# Patient Record
Sex: Male | Born: 1970
Health system: Southern US, Community
[De-identification: ages and names within clinical notes are randomized; demographics above are authoritative.]

## PROBLEM LIST (undated history)

## (undated) DIAGNOSIS — I824Y9 Acute embolism and thrombosis of unspecified deep veins of unspecified proximal lower extremity: Secondary | ICD-10-CM

## (undated) DIAGNOSIS — E1122 Type 2 diabetes mellitus with diabetic chronic kidney disease: Secondary | ICD-10-CM

## (undated) DIAGNOSIS — E8809 Other disorders of plasma-protein metabolism, not elsewhere classified: Secondary | ICD-10-CM

## (undated) DIAGNOSIS — M726 Necrotizing fasciitis: Secondary | ICD-10-CM

## (undated) DIAGNOSIS — Z7901 Long term (current) use of anticoagulants: Secondary | ICD-10-CM

## (undated) DIAGNOSIS — E119 Type 2 diabetes mellitus without complications: Secondary | ICD-10-CM

## (undated) DIAGNOSIS — D62 Acute posthemorrhagic anemia: Secondary | ICD-10-CM

## (undated) DIAGNOSIS — Z8719 Personal history of other diseases of the digestive system: Secondary | ICD-10-CM

## (undated) DIAGNOSIS — N179 Acute kidney failure, unspecified: Secondary | ICD-10-CM

## (undated) DIAGNOSIS — Z794 Long term (current) use of insulin: Secondary | ICD-10-CM

## (undated) DIAGNOSIS — I10 Essential (primary) hypertension: Secondary | ICD-10-CM

## (undated) DIAGNOSIS — L03115 Cellulitis of right lower limb: Secondary | ICD-10-CM

## (undated) DIAGNOSIS — E11622 Type 2 diabetes mellitus with other skin ulcer: Secondary | ICD-10-CM

## (undated) DIAGNOSIS — N182 Chronic kidney disease, stage 2 (mild): Secondary | ICD-10-CM

## (undated) DIAGNOSIS — I82401 Acute embolism and thrombosis of unspecified deep veins of right lower extremity: Secondary | ICD-10-CM

## (undated) DIAGNOSIS — M109 Gout, unspecified: Secondary | ICD-10-CM

## (undated) DIAGNOSIS — A4151 Sepsis due to Escherichia coli [E. coli]: Secondary | ICD-10-CM

## (undated) DIAGNOSIS — L97209 Non-pressure chronic ulcer of unspecified calf with unspecified severity: Secondary | ICD-10-CM

## (undated) DIAGNOSIS — A419 Sepsis, unspecified organism: Secondary | ICD-10-CM

## (undated) DIAGNOSIS — R000 Tachycardia, unspecified: Secondary | ICD-10-CM

## (undated) HISTORY — DX: Acute kidney failure, unspecified: N17.9

## (undated) HISTORY — DX: Necrotizing fasciitis: M72.6

## (undated) HISTORY — DX: Non-pressure chronic ulcer of unspecified calf with unspecified severity: L97.209

## (undated) HISTORY — DX: Long term (current) use of insulin: Z79.4

## (undated) HISTORY — DX: Chronic kidney disease, stage 2 (mild): N18.2

## (undated) HISTORY — DX: Acute posthemorrhagic anemia: D62

## (undated) HISTORY — DX: Acute embolism and thrombosis of unspecified deep veins of unspecified proximal lower extremity: I82.4Y9

## (undated) HISTORY — DX: Other disorders of plasma-protein metabolism, not elsewhere classified: E88.09

## (undated) HISTORY — DX: Sepsis due to Escherichia coli (e. coli): A41.51

## (undated) HISTORY — DX: Acute embolism and thrombosis of unspecified deep veins of right lower extremity: I82.401

## (undated) HISTORY — DX: Cellulitis of right lower limb: L03.115

## (undated) HISTORY — DX: Tachycardia, unspecified: R00.0

## (undated) HISTORY — DX: Type 2 diabetes mellitus with other skin ulcer: E11.622

## (undated) HISTORY — DX: Sepsis, unspecified organism: A41.9

## (undated) HISTORY — DX: Long term (current) use of anticoagulants: Z79.01

## (undated) HISTORY — DX: Type 2 diabetes mellitus with diabetic chronic kidney disease: E11.22

## (undated) HISTORY — PX: ELBOW SURGERY: SHX618

---

## 2002-12-10 ENCOUNTER — Encounter: Payer: Self-pay | Admitting: Emergency Medicine

## 2002-12-10 ENCOUNTER — Emergency Department (HOSPITAL_COMMUNITY): Admission: EM | Admit: 2002-12-10 | Discharge: 2002-12-10 | Payer: Self-pay | Admitting: Emergency Medicine

## 2004-07-16 HISTORY — PX: SCROTAL SURGERY: SHX2387

## 2007-07-19 ENCOUNTER — Emergency Department (HOSPITAL_COMMUNITY): Admission: EM | Admit: 2007-07-19 | Discharge: 2007-07-19 | Payer: Self-pay | Admitting: Emergency Medicine

## 2008-01-28 DIAGNOSIS — I10 Essential (primary) hypertension: Secondary | ICD-10-CM | POA: Insufficient documentation

## 2011-04-05 LAB — POCT I-STAT CREATININE
Creatinine, Ser: 1.3
Operator id: 285491

## 2011-04-05 LAB — I-STAT 8, (EC8 V) (CONVERTED LAB)
Acid-Base Excess: 2
BUN: 12
Bicarbonate: 28.2 — ABNORMAL HIGH
Chloride: 104
Glucose, Bld: 116 — ABNORMAL HIGH
HCT: 52
Hemoglobin: 17.7 — ABNORMAL HIGH
Operator id: 285491
Potassium: 3.5
Sodium: 139
TCO2: 30
pCO2, Ven: 46.1
pH, Ven: 7.395 — ABNORMAL HIGH

## 2016-02-29 ENCOUNTER — Ambulatory Visit (INDEPENDENT_AMBULATORY_CARE_PROVIDER_SITE_OTHER): Payer: PRIVATE HEALTH INSURANCE | Admitting: Physician Assistant

## 2016-02-29 ENCOUNTER — Ambulatory Visit (INDEPENDENT_AMBULATORY_CARE_PROVIDER_SITE_OTHER): Payer: PRIVATE HEALTH INSURANCE

## 2016-02-29 VITALS — BP 182/110 | HR 99 | Temp 98.9°F | Resp 17 | Ht 70.0 in | Wt 252.0 lb

## 2016-02-29 DIAGNOSIS — M25562 Pain in left knee: Secondary | ICD-10-CM

## 2016-02-29 DIAGNOSIS — M25462 Effusion, left knee: Secondary | ICD-10-CM | POA: Diagnosis not present

## 2016-02-29 LAB — POCT CBC
Granulocyte percent: 58 %G (ref 37–80)
HCT, POC: 43.2 % — AB (ref 43.5–53.7)
Hemoglobin: 15.1 g/dL (ref 14.1–18.1)
Lymph, poc: 2.2 (ref 0.6–3.4)
MCH, POC: 29.7 pg (ref 27–31.2)
MCHC: 34.9 g/dL (ref 31.8–35.4)
MCV: 85 fL (ref 80–97)
MID (cbc): 0.3 (ref 0–0.9)
MPV: 8.2 fL (ref 0–99.8)
POC Granulocyte: 3.5 (ref 2–6.9)
POC LYMPH PERCENT: 36.3 %L (ref 10–50)
POC MID %: 5.7 %M (ref 0–12)
Platelet Count, POC: 252 10*3/uL (ref 142–424)
RBC: 5.08 M/uL (ref 4.69–6.13)
RDW, POC: 12.6 %
WBC: 6.1 10*3/uL (ref 4.6–10.2)

## 2016-02-29 LAB — D-DIMER, QUANTITATIVE (NOT AT ARMC): D-Dimer, Quant: 0.35 mcg/mL FEU (ref <0.50)

## 2016-02-29 MED ORDER — INDOMETHACIN 25 MG PO CAPS: 25.0000 mg | ORAL_CAPSULE | Freq: Two times a day (BID) | ORAL | 0 refills | Status: DC

## 2016-02-29 NOTE — Progress Notes (Signed)
By signing my name below, I, Mesha Guinyard, attest that this documentation has been prepared under the direction and in the presence of Ivar Drape, MD.  Electronically Signed: Verlee Monte, Medical Scribe. 02/29/16. 10:52 AM.  Subjective:    Patient ID: [name], male    DOB: 08-28-1970, 45 y.o.   MRN: [id number]  HPI Chief Complaint  Patient presents with   Knee Pain    and swelling x3days. Left knee. Pt states swelling "started in his foot"    HPI Comments: Benjerman K Steighner is a 45 y.o. male who presents to the [facility] complaining of knee pain onset 2 days ago. Pt mentions pain started at his foot 5 days ago, and the pain radiated to his knee. Pt reports limping, having trouble bending when he gets in and out of his car. Pt walks a lot every night for is job. Pt mentions he usually has gout in his feet. Pt has been eats a sausage patty every morning for breakfast and mentions he's been eating more red meats. Pt took a 1 hour drive to [place], [place] 4 days ago. Pt hasn't taken his bp medication this morning. Pt denies his knee giving out. Pt denies PMHx of blood clots, or malignancies.   There are no active problems to display for this patient.  No past medical history on file. No past surgical history on file. Not on File Prior to Admission medications   Medication Sig Start Date End Date Taking? Authorizing Provider  amLODipine (NORVASC) 2.5 MG tablet Take 2.5 mg by mouth daily.   Yes Historical Provider, MD  insulin aspart protamine- aspart (NOVOLOG MIX 70/30) (70-30) 100 UNIT/ML injection Inject into the skin.   Yes Historical Provider, MD  lisinopril-hydrochlorothiazide (PRINZIDE,ZESTORETIC) 10-12.5 MG tablet Take 1 tablet by mouth daily.   Yes Historical Provider, MD   Social History   Social History   Marital status: Single    Spouse name: N/A   Number of children: N/A   Years of education: N/A   Occupational History    Not on file.   Social History Main Topics   Smoking status: Never Smoker   Smokeless tobacco: Not on file   Alcohol use Not on file   Drug use: Unknown   Sexual activity: Not on file   Other Topics Concern   Not on file   Social History Narrative   No narrative on file   Depression screen [facility] 2/9 02/29/2016  Decreased Interest 0  Down, Depressed, Hopeless 0  PHQ - 2 Score 0   Review of Systems  Constitutional: Negative for fever.  Respiratory: Negative for shortness of breath.   Cardiovascular: Negative for chest pain.  Musculoskeletal: Positive for arthralgias and joint swelling.  Neurological: Negative for dizziness.  ROS reviewed. Otherwise unremarkable, unless listed above.  Objective:  Physical Exam  Constitutional: He appears well-developed and well-nourished. No distress.  HENT:  Head: Normocephalic and atraumatic.  Eyes: Conjunctivae are normal.  Neck: Neck supple.  Cardiovascular: Normal rate.   Pulmonary/Chest: Effort normal.  Musculoskeletal:  Left knee diffuse swelling and warmth in the area. Swelling extends above the knee Tenderness along medial and lateral joint line  No laxity to the knee Posterior tenderness to the knee Negative anterior drawer test  Neurological: He is alert.  Skin: Skin is warm and dry.  Psychiatric: He has a normal mood and affect. His behavior is normal.  Nursing note and vitals reviewed.  BP (!) 170/130 (  BP Location: Right Arm, Patient Position: Sitting, Cuff Size: Normal)    Pulse 99    Temp 98.9 F (37.2 C)    Resp 17    Ht 5\' 10"  (1.778 m)    Wt 252 lb (114.3 kg)    SpO2 98%    BMI 36.16 kg/m   Dg Knee Complete 4 Views Left  Result Date: 02/29/2016 CLINICAL DATA:  Left knee swelling. No known injury. Initial evaluation EXAM: LEFT KNEE - COMPLETE 4+ VIEW COMPARISON:  No recent prior . FINDINGS: No acute bony or joint abnormality identified. No evidence fracture or dislocation. Mild medial compartment degenerative  change. Knee joint effusion cannot be excluded. IMPRESSION: 1. No acute bony abnormality. Mild medial compartment degenerative change. 2. Knee joint effusion cannot be excluded. Electronically Signed   By: Marcello Moores  Register   On: 02/29/2016 11:11   Results for orders placed or performed in visit on 02/29/16  POCT CBC  Result Value Ref Range   WBC 6.1 4.6 - 10.2 K/uL   Lymph, poc 2.2 0.6 - 3.4   POC LYMPH PERCENT 36.3 10 - 50 %L   MID (cbc) 0.3 0 - 0.9   POC MID % 5.7 0 - 12 %M   POC Granulocyte 3.5 2 - 6.9   Granulocyte percent 58.0 37 - 80 %G   RBC 5.08 4.69 - 6.13 M/uL   Hemoglobin 15.1 14.1 - 18.1 g/dL   HCT, POC 43.2 (A) 43.5 - 53.7 %   MCV 85.0 80 - 97 fL   MCH, POC 29.7 27 - 31.2 pg   MCHC 34.9 31.8 - 35.4 g/dL   RDW, POC 12.6 %   Platelet Count, POC 252 142 - 424 K/uL   MPV 8.2 0 - 99.8 fL   Assessment & Plan:  45 year old male is here today for knee pain.  --treating as possible gout, possible inflammatory condition.  Will take d dimer.  Advised to return within 10 days if symptoms do not improve. We are also drawing a d-dimer.  He has voiced understanding that he will have the  Left knee pain - Plan: DG Knee Complete 4 Views Left, POCT CBC, indomethacin (INDOCIN) 25 MG capsule, D-dimer, quantitative (not at [facility]), CANCELED: D-dimer, quantitative (not at [facility])  Knee swelling, left - Plan: DG Knee Complete 4 Views Left, POCT CBC, indomethacin (INDOCIN) 25 MG capsule, D-dimer, quantitative (not at [facility]), CANCELED: D-dimer, quantitative (not at [facility])  Ivar Drape, PA-C [facility] 8/25/20177:35 AM   I personally performed the services described in this documentation, which was scribed in my presence. The recorded information has been reviewed and is accurate.

## 2016-02-29 NOTE — Patient Instructions (Addendum)
Please rest and ice the knee.  Please await a phone call today.  If we contact you with a positive d dimer, I need you to go to the emergency department immediately.  Gout Gout is when your joints become red, sore, and swell (inflamed). This is caused by the buildup of uric acid crystals in the joints. Uric acid is a chemical that is normally in the blood. If the level of uric acid gets too high in the blood, these crystals form in your joints and tissues. Over time, these crystals can form into masses near the joints and tissues. These masses can destroy bone and cause the bone to look misshapen (deformed). HOME CARE   Do not take aspirin for pain.  Only take medicine as told by your doctor.  Rest the joint as much as you can. When in bed, keep sheets and blankets off painful areas.  Keep the sore joints raised (elevated).  Put warm or cold packs on painful joints. Use of warm or cold packs depends on which works best for you.  Use crutches if the painful joint is in your leg.  Drink enough fluids to keep your pee (urine) clear or pale yellow. Limit alcohol, sugary drinks, and drinks with fructose in them.  Follow your diet instructions. Pay careful attention to how much protein you eat. Include fruits, vegetables, whole grains, and fat-free or low-fat milk products in your daily diet. Talk to your doctor or dietitian about the use of coffee, vitamin C, and cherries. These may help lower uric acid levels.  Keep a healthy body weight. GET HELP RIGHT AWAY IF:   You have watery poop (diarrhea), throw up (vomit), or have any side effects from medicines.  You do not feel better in 24 hours, or you are getting worse.  Your joint becomes suddenly more tender, and you have chills or a fever. MAKE SURE YOU:   Understand these instructions.  Will watch your condition.  Will get help right away if you are not doing well or get worse.   This information is not intended to replace advice  given to you by your health care provider. Make sure you discuss any questions you have with your health care provider.   Document Released: 04/10/2008 Document Revised: 07/23/2014 Document Reviewed: 02/13/2012 Elsevier Interactive Patient Education 2016 [employer].    IF you received an x-ray today, you will receive an invoice from [facility]. Please contact [facility] at [phone] with questions or concerns regarding your invoice.   IF you received labwork today, you will receive an invoice from [employer]. Please contact [facility] at [phone] with questions or concerns regarding your invoice.   Our billing staff will not be able to assist you with questions regarding bills from these companies.  You will be contacted with the lab results as soon as they are available. The fastest way to get your results is to activate your My Chart account. Instructions are located on the last page of this paperwork. If you have not heard from [place] regarding the results in 2 weeks, please contact this office.

## 2017-03-20 ENCOUNTER — Emergency Department
Admission: EM | Admit: 2017-03-20 | Discharge: 2017-03-20 | Disposition: A | Discharge status: Home / Self Care | Payer: BLUE CROSS/BLUE SHIELD | Attending: Emergency Medicine | Admitting: Emergency Medicine

## 2017-03-20 ENCOUNTER — Encounter: Payer: Self-pay | Admitting: Emergency Medicine

## 2017-03-20 ENCOUNTER — Emergency Department: Payer: BLUE CROSS/BLUE SHIELD

## 2017-03-20 DIAGNOSIS — I82431 Acute embolism and thrombosis of right popliteal vein: Secondary | ICD-10-CM | POA: Diagnosis not present

## 2017-03-20 DIAGNOSIS — E119 Type 2 diabetes mellitus without complications: Secondary | ICD-10-CM | POA: Insufficient documentation

## 2017-03-20 DIAGNOSIS — I824Y9 Acute embolism and thrombosis of unspecified deep veins of unspecified proximal lower extremity: Secondary | ICD-10-CM

## 2017-03-20 DIAGNOSIS — I10 Essential (primary) hypertension: Secondary | ICD-10-CM | POA: Diagnosis not present

## 2017-03-20 DIAGNOSIS — Z79899 Other long term (current) drug therapy: Secondary | ICD-10-CM | POA: Diagnosis not present

## 2017-03-20 DIAGNOSIS — M79661 Pain in right lower leg: Secondary | ICD-10-CM | POA: Diagnosis present

## 2017-03-20 DIAGNOSIS — Z794 Long term (current) use of insulin: Secondary | ICD-10-CM | POA: Insufficient documentation

## 2017-03-20 HISTORY — DX: Gout, unspecified: M10.9

## 2017-03-20 HISTORY — DX: Type 2 diabetes mellitus without complications: E11.9

## 2017-03-20 HISTORY — DX: Essential (primary) hypertension: I10

## 2017-03-20 HISTORY — DX: Acute embolism and thrombosis of unspecified deep veins of unspecified proximal lower extremity: I82.4Y9

## 2017-03-20 MED ORDER — RIVAROXABAN (XARELTO) VTE STARTER PACK (15 & 20 MG): ORAL_TABLET | ORAL | 0 refills | Status: DC

## 2017-03-20 MED ORDER — RIVAROXABAN 15 MG PO TABS
15.0000 mg | ORAL_TABLET | Freq: Once | ORAL | Status: AC
Start: 2017-03-20 — End: 2017-03-20
  Administered 2017-03-20: 15 mg via ORAL
  Filled 2017-03-20: qty 1

## 2017-03-20 MED ORDER — RIVAROXABAN 20 MG PO TABS: 20.0000 mg | ORAL_TABLET | Freq: Once | ORAL | Status: DC

## 2017-03-20 NOTE — ED Triage Notes (Signed)
Patient to ER for c/o right leg pain and swelling. States swelling started slowly a couple days ago, rested it and then went back to work. States today leg is much more swollen than it was. Denies any known injury. Pain is more severe to back of lower calf per patient. Patient able to ambulate to stat desk/triage, but visibly in pain.

## 2017-03-20 NOTE — ED Notes (Signed)
Questioned patient about history of hypertension and diabetes.  Patient reports moved here from [place] June 2016 and reports had insulin and hypertension medication but has since run out and does not have a primary care physician in the area.  ED provider made aware.

## 2017-03-20 NOTE — ED Notes (Signed)
Patient reports having pain and swelling to right calf off and on since Sunday, with pain becoming worse on Tuesday.  Patient reports car trip to [place] on Friday evening and returning on Monday morning.  Patient reports that he took frequent breaks driving to and from.  Patient with good pedal pulses bilaterally, denies shortness of breath or chest pain.

## 2017-03-20 NOTE — ED Provider Notes (Signed)
[facility] Emergency Department Provider Note   ____________________________________________   First MD Initiated Contact with Patient 03/20/17 [phone]     (approximate)  I have reviewed the triage vital signs and the nursing notes.   HISTORY  Chief Complaint Leg Pain    HPI [name] is a 46 y.o. male Who comes into the hospital today with right leg swelling. He's had these symptoms off and on he reports since Sunday. It got really bad tonight while he was at work. He had pain from his knee down to his leg. The patient wanted to get it looked at. He has not been taking anything for pain. This all started on Sunday afternoon. The patient rates his pain a 5 out of 10 in intensity. He reports that if he sits still the swelling seems to go down but if he walks on it swells more. The patient recently traveled to [place]. He left on Saturday and returned on Monday. The patient states that he did get out of the car and ambulate frequently. He is here today for evaluation.The patient has no chest pain and shortness of breath   Past Medical History:  Diagnosis Date  . Diabetes mellitus without complication ([place])   . Gout   . Hypertension     There are no active problems to display for this patient.   Past Surgical History:  Procedure Laterality Date  . SCROTAL SURGERY  2006    Prior to Admission medications   Medication Sig Start Date End Date Taking? Authorizing Provider  amLODipine (NORVASC) 2.5 MG tablet Take 2.5 mg by mouth daily.    [provider]  indomethacin (INDOCIN) 25 MG capsule Take 1 capsule (25 mg total) by mouth 2 (two) times daily with a meal. 02/29/16   English, Colletta Maryland D, PA  insulin aspart protamine- aspart (NOVOLOG MIX 70/30) (70-30) 100 UNIT/ML injection Inject into the skin.    [provider]  lisinopril-hydrochlorothiazide (PRINZIDE,ZESTORETIC) 10-12.5 MG tablet Take 1 tablet by mouth daily.    [provider]  Rivaroxaban 15 & 20 MG TBPK Take as directed on package: Start with one 15mg  tablet by mouth twice a day with food. On Day 22, switch to one 20mg  tablet once a day with food. 03/20/17   Loney Hering, MD    Allergies Patient has no known allergies.  No family history on file.  Social History Social History  Substance Use Topics  . Smoking status: Never Smoker  . Smokeless tobacco: Never Used  . Alcohol use No    Review of Systems  Constitutional: No fever/chills Eyes: No visual changes. ENT: No sore throat. Cardiovascular: Denies chest pain. Respiratory: Denies shortness of breath. Gastrointestinal: No abdominal pain.  No nausea, no vomiting.  No diarrhea.  No constipation. Genitourinary: Negative for dysuria. Musculoskeletal: right leg pain Skin: Negative for rash. Neurological: Negative for headaches, focal weakness or numbness.   ____________________________________________   PHYSICAL EXAM:  VITAL SIGNS: ED Triage Vitals  Enc Vitals Group     BP 03/20/17 0340 (!) 172/108     Pulse Rate 03/20/17 0340 (!) 109     Resp 03/20/17 0340 20     Temp 03/20/17 0340 98.6 F (37 C)     Temp Source 03/20/17 0340 Oral     SpO2 03/20/17 0340 97 %     Weight 03/20/17 0341 245 lb (111.1 kg)     Height 03/20/17 0341 5' 11.5" (1.816 m)  Head Circumference --      Peak Flow --      Pain Score 03/20/17 0334 5     Pain Loc --      Pain Edu? --      Excl. in [place]? --     Constitutional: Alert and oriented. Well appearing and in mild distress. Eyes: Conjunctivae are normal. PERRL. EOMI. Head: Atraumatic. Nose: No congestion/rhinnorhea. Mouth/Throat: Mucous membranes are moist.  Oropharynx non-erythematous. Cardiovascular: Normal rate, regular rhythm. Grossly normal heart sounds.  Good peripheral circulation. Respiratory: Normal respiratory effort.  No retractions. Lungs CTAB. Gastrointestinal: Soft and nontender. No distention. positive bowel  sounds Musculoskeletal: mild tenderness to palpation of right calf, no significant swelling noted. Neurologic:  Normal speech and language.  Skin:  Skin is warm, dry and intact.Marland Kitchen Psychiatric: Mood and affect are normal.   ____________________________________________   LABS (all labs ordered are listed, but only abnormal results are displayed)  Labs Reviewed - No data to display ____________________________________________  EKG  none ____________________________________________  RADIOLOGY  US Venous Img Lower Unilateral Right  Result Date: 03/20/2017 CLINICAL DATA:  Right leg pain and swelling for several days. EXAM: Right LOWER EXTREMITY VENOUS DOPPLER ULTRASOUND TECHNIQUE: Gray-scale sonography with graded compression, as well as color Doppler and duplex ultrasound were performed to evaluate the lower extremity deep venous systems from the level of the common femoral vein and including the common femoral, femoral, profunda femoral, popliteal and calf veins including the posterior tibial, peroneal and gastrocnemius veins when visible. The superficial great saphenous vein was also interrogated. Spectral Doppler was utilized to evaluate flow at rest and with distal augmentation maneuvers in the common femoral, femoral and popliteal veins. COMPARISON:  None. FINDINGS: Contralateral Common Femoral Vein: Respiratory phasicity is normal and symmetric with the symptomatic side. No evidence of thrombus. Normal compressibility. Common Femoral Vein: No evidence of thrombus. Normal compressibility, respiratory phasicity and response to augmentation. Saphenofemoral Junction: No evidence of thrombus. Normal compressibility and flow on color Doppler imaging. Profunda Femoral Vein: No evidence of thrombus. Normal compressibility and flow on color Doppler imaging. Femoral Vein: No evidence of thrombus. Normal compressibility, respiratory phasicity and response to augmentation. Popliteal Vein: Popliteal vein  demonstrates expansile echogenic thrombus with noncompressibility of the vessels. Some flow is demonstrated on color flow Doppler imaging through this area. Calf Veins: No evidence of thrombus. Normal compressibility and flow on color Doppler imaging. Superficial Great Saphenous Vein: No evidence of thrombus. Normal compressibility and flow on color Doppler imaging. Venous Reflux:  None. Other Findings:  None. IMPRESSION: Positive study for acute deep venous thrombosis demonstrated in the popliteal vein. These results were called by telephone at the time of interpretation on 03/20/2017 at 4:45 am to Dr. Marjean Donna , who verbally acknowledged these results. Electronically Signed   By: Lucienne Capers M.D.   On: 03/20/2017 04:49    ____________________________________________   PROCEDURES  Procedure(s) performed: None  Procedures  Critical Care performed: No  ____________________________________________   INITIAL IMPRESSION / ASSESSMENT AND PLAN / ED COURSE  Pertinent labs & imaging results that were available during my care of the patient were reviewed by me and considered in my medical decision making (see chart for details).  this is a 46 year old who comes into the hospital today with some right leg swelling. He's had these symptoms since Sunday and has recent travel. The patient had an ultrasound which showed a popliteal deep venous thrombosis. I will give the patient a dose of Xarelto. He is not having  any chest pain or shortness of breath. We will reassess the patient's vital signs and disposition him. The patient should follow-up with heme/onc      ____________________________________________   FINAL CLINICAL IMPRESSION(S) / ED DIAGNOSES  Final diagnoses:  Acute deep vein thrombosis (DVT) of popliteal vein of right lower extremity (HCC)      NEW MEDICATIONS STARTED DURING THIS VISIT:  New Prescriptions   RIVAROXABAN 15 & 20 MG TBPK    Take as directed on package:  Start with one 15mg  tablet by mouth twice a day with food. On Day 22, switch to one 20mg  tablet once a day with food.     Note:  This document was prepared using Dragon voice recognition software and may include unintentional dictation errors.    Loney Hering, MD 03/20/17 [phone]

## 2017-03-22 ENCOUNTER — Telehealth: Payer: Self-pay | Admitting: *Deleted

## 2017-03-22 ENCOUNTER — Inpatient Hospital Stay: Payer: BLUE CROSS/BLUE SHIELD | Attending: Oncology | Admitting: Oncology

## 2017-03-22 ENCOUNTER — Encounter: Payer: Self-pay | Admitting: Oncology

## 2017-03-22 VITALS — BP 183/118 | HR 91 | Temp 96.8°F | Resp 18 | Ht 72.44 in | Wt 235.0 lb

## 2017-03-22 DIAGNOSIS — Z794 Long term (current) use of insulin: Secondary | ICD-10-CM | POA: Insufficient documentation

## 2017-03-22 DIAGNOSIS — I824Y1 Acute embolism and thrombosis of unspecified deep veins of right proximal lower extremity: Secondary | ICD-10-CM | POA: Insufficient documentation

## 2017-03-22 DIAGNOSIS — N289 Disorder of kidney and ureter, unspecified: Secondary | ICD-10-CM | POA: Diagnosis not present

## 2017-03-22 DIAGNOSIS — E1165 Type 2 diabetes mellitus with hyperglycemia: Secondary | ICD-10-CM | POA: Insufficient documentation

## 2017-03-22 DIAGNOSIS — Z79899 Other long term (current) drug therapy: Secondary | ICD-10-CM | POA: Insufficient documentation

## 2017-03-22 DIAGNOSIS — E669 Obesity, unspecified: Secondary | ICD-10-CM

## 2017-03-22 DIAGNOSIS — E118 Type 2 diabetes mellitus with unspecified complications: Secondary | ICD-10-CM

## 2017-03-22 DIAGNOSIS — E119 Type 2 diabetes mellitus without complications: Secondary | ICD-10-CM | POA: Insufficient documentation

## 2017-03-22 DIAGNOSIS — I10 Essential (primary) hypertension: Secondary | ICD-10-CM

## 2017-03-22 DIAGNOSIS — Z7901 Long term (current) use of anticoagulants: Secondary | ICD-10-CM | POA: Insufficient documentation

## 2017-03-22 NOTE — Progress Notes (Signed)
Hematology/Oncology Consult note [facility] Telephone:([phone] [phone]  Patient Care Team: Patient, No Pcp Per as PCP - General (General Practice)   Name of the patient: [name]  [id number]  [birth year]    Reason for referral- right popliteal vein DVT   Referring physician- ER  Date of visit: 03/22/17   History of presenting illness- 1. Patient is a 46 year old African-American male who moved to [place] from [place] last year. His past medical history is significant for hypertension and diabetes among other medical problems patient states that he was on medications for these conditions while he was in [place] and he hasn't taken his medications over the last 1 year. He presented with right lower extremity pain and swelling and underwent Doppler on 03/20/2017 which showed deep venous thromboses in the right popliteal vein. He had driven 6-0/1 hours to [place] on 03/17/2017 and back on 03/19/2017. Denies any prior history of DVT or PE. He has not been on blood thinners in the past. Denies any family history of blood clots. Other than the 5-1/2 hour drive patient states that he was otherwise active in the preceding days. Denies any trauma. Right lower extremity pain and swelling has improved over the last few days. He was discharged from the ER on Xarelto and is currently taking the same. He is yet to see his PCP and is scheduled to see Dr.Hande on 03/27/2017. Stopped smoking in 1992. Does not drink alcohol. Denies any unintentional wight loss.   2. Recent CBC from 03/22/2017 was within normal limits. CMP showed an elevated blood sugar of 352, elevated creatinine of 1.4  ECOG PS- 1  Pain scale- 6   Review of systems- Review of Systems  Constitutional: Negative for chills, fever, malaise/fatigue and weight loss.  HENT: Negative for congestion, ear discharge and nosebleeds.   Eyes: Negative for blurred vision.  Respiratory: Negative for  cough, hemoptysis, sputum production, shortness of breath and wheezing.   Cardiovascular: Negative for chest pain, palpitations, orthopnea and claudication.  Gastrointestinal: Negative for abdominal pain, blood in stool, constipation, diarrhea, heartburn, melena, nausea and vomiting.  Genitourinary: Negative for dysuria, flank pain, frequency, hematuria and urgency.  Musculoskeletal: Negative for back pain, joint pain and myalgias.  Skin: Negative for rash.  Neurological: Negative for dizziness, tingling, focal weakness, seizures, weakness and headaches.  Endo/Heme/Allergies: Does not bruise/bleed easily.  Psychiatric/Behavioral: Negative for depression and suicidal ideas. The patient does not have insomnia.     No Known Allergies  There are no active problems to display for this patient.    Past Medical History:  Diagnosis Date  . Diabetes mellitus without complication ([place])   . DVT of proximal lower limb ([place]) 03/20/2017   R calf area  . Gout   . Hypertension      Past Surgical History:  Procedure Laterality Date  . SCROTAL SURGERY  2006    Social History   Social History  . Marital status: Single    Spouse name: N/A  . Number of children: N/A  . Years of education: N/A   Occupational History  . Not on file.   Social History Main Topics  . Smoking status: Never Smoker  . Smokeless tobacco: Never Used  . Alcohol use No  . Drug use: No  . Sexual activity: No   Other Topics Concern  . Not on file   Social History Narrative  . No narrative on file     Family History  Problem Relation Age  of Onset  . Hypertension Mother   . Stroke Mother   . Diabetes Father   . Renal Disease Father   . Hypertension Sister   . Diabetes Sister   . Renal Disease Sister   . Iron deficiency Sister      Current Outpatient Prescriptions:  .  Rivaroxaban 15 & 20 MG TBPK, Take as directed on package: Start with one 15mg  tablet by mouth twice a day with food. On Day 22,  switch to one 20mg  tablet once a day with food., Disp: 51 each, Rfl: 0 .  amLODipine (NORVASC) 2.5 MG tablet, Take 2.5 mg by mouth daily., Disp: , Rfl:  .  indomethacin (INDOCIN) 25 MG capsule, Take 1 capsule (25 mg total) by mouth 2 (two) times daily with a meal. (Patient not taking: Reported on 03/22/2017), Disp: 14 capsule, Rfl: 0 .  insulin aspart protamine- aspart (NOVOLOG MIX 70/30) (70-30) 100 UNIT/ML injection, Inject into the skin., Disp: , Rfl:  .  lisinopril-hydrochlorothiazide (PRINZIDE,ZESTORETIC) 10-12.5 MG tablet, Take 1 tablet by mouth daily., Disp: , Rfl:    Physical exam:  Vitals:   03/22/17 1109  BP: (!) 183/118  Pulse: 91  Resp: 18  Temp: (!) 96.8 F (36 C)  TempSrc: Tympanic  Weight: 235 lb (106.6 kg)  Height: 6' 0.44" (1.84 m)   Physical Exam  Constitutional: He is oriented to person, place, and time and well-developed, well-nourished, and in no distress.  HENT:  Head: Normocephalic and atraumatic.  Eyes: Pupils are equal, round, and reactive to light. EOM are normal.  Neck: Normal range of motion.  Cardiovascular: Normal rate, regular rhythm and normal heart sounds.   Pulmonary/Chest: Effort normal and breath sounds normal.  Abdominal: Soft. Bowel sounds are normal.  Neurological: He is alert and oriented to person, place, and time.  Skin: Skin is warm and dry.       CMP 07/19/2007  Glucose 116(H)  BUN 12  Creatinine 1.3  Sodium 139  Potassium 3.5  Chloride 104   CBC Latest Ref Rng & Units 02/29/2016  WBC 4.6 - 10.2 K/uL 6.1  Hemoglobin 14.1 - 18.1 g/dL 15.1  Hematocrit 43.5 - 53.7 % 43.2(A)    No images are attached to the encounter.  US Venous Img Lower Unilateral Right  Result Date: 03/20/2017 CLINICAL DATA:  Right leg pain and swelling for several days. EXAM: Right LOWER EXTREMITY VENOUS DOPPLER ULTRASOUND TECHNIQUE: Gray-scale sonography with graded compression, as well as color Doppler and duplex ultrasound were performed to evaluate the  lower extremity deep venous systems from the level of the common femoral vein and including the common femoral, femoral, profunda femoral, popliteal and calf veins including the posterior tibial, peroneal and gastrocnemius veins when visible. The superficial great saphenous vein was also interrogated. Spectral Doppler was utilized to evaluate flow at rest and with distal augmentation maneuvers in the common femoral, femoral and popliteal veins. COMPARISON:  None. FINDINGS: Contralateral Common Femoral Vein: Respiratory phasicity is normal and symmetric with the symptomatic side. No evidence of thrombus. Normal compressibility. Common Femoral Vein: No evidence of thrombus. Normal compressibility, respiratory phasicity and response to augmentation. Saphenofemoral Junction: No evidence of thrombus. Normal compressibility and flow on color Doppler imaging. Profunda Femoral Vein: No evidence of thrombus. Normal compressibility and flow on color Doppler imaging. Femoral Vein: No evidence of thrombus. Normal compressibility, respiratory phasicity and response to augmentation. Popliteal Vein: Popliteal vein demonstrates expansile echogenic thrombus with noncompressibility of the vessels. Some flow is demonstrated on color  flow Doppler imaging through this area. Calf Veins: No evidence of thrombus. Normal compressibility and flow on color Doppler imaging. Superficial Great Saphenous Vein: No evidence of thrombus. Normal compressibility and flow on color Doppler imaging. Venous Reflux:  None. Other Findings:  None. IMPRESSION: Positive study for acute deep venous thrombosis demonstrated in the popliteal vein. These results were called by telephone at the time of interpretation on 03/20/2017 at 4:45 am to Dr. Marjean Donna , who verbally acknowledged these results. Electronically Signed   By: Lucienne Capers M.D.   On: 03/20/2017 04:49    Assessment and plan- Patient is a 46 y.o. male referred by ER for right lower  extremity DVT  On reviewing patient's prior history he is an uncontrolled diabetic who is obese and had a seemingly unprovoked proximal DVT. Due to these risk factors he is at a high risk for recurrent DVT and in the absence of any bleeding complications I would favor indefinite anticoagulation for him. Decision to continue or stop anticoagulation would be an ongoing discussion between risk of bleeding and risk of clotting. Genetic testing for DVT is not going to change management in his case and hence I will not be ordering those tests today.   At this point patient understands his risks of recurrent DVT if anticoagulation is stopped after 6 months versus risk of bleeding if he were to have any trauma. Patient currently does not have any bleeding problems and at this point his risk of clotting outweighs risk of bleeding. He has an intermediate bleeding risk because of renal failure 3.2% in first 3 months and 1.3% thereafter. He will continue Xarelto and would like to see me back in 6 months time to discuss about continuing versus stopping anticoagulation at that point. Until he sees Dr. Ginette Pitman, we will see if we can help him get his xarelto if he runs out of it  Xarelto now has FDA approved reversal agent adnexanet alfa but may not be available readily if needed. He also has some renal dysfunction that needs to be monitored while on xarelto  Patient was seen by Dr. Deloria Lair at [facility] clinic yesterday and was prescribed lisinopril which he is yet to pick up. We have moved his appointment with Dr. Ginette Pitman on 9/12 from 10/23. Repeat BP check showed systolic BP of 151. This will be further managed by Dr. Ginette Pitman.  RTC in 6 months  Thank you for this kind referral and the opportunity to participate in the care of this patient   Visit Diagnosis 1. Acute deep vein thrombosis (DVT) of proximal vein of right lower extremity ([place])   2. Hypertension, unspecified type   3. Type 2 diabetes mellitus with  complication, without long-term current use of insulin ([place])     Dr. Randa Evens, MD, MPH [place] [facility] Pager- [phone] 03/22/2017  12:10 PM

## 2017-03-22 NOTE — Progress Notes (Signed)
Here for new pt evaluation. BP elevated 183/118  Repeat 165/118. [place] but never transferred meds so has been off BP meds and meds for Diabetes since June 2017.  Went to Er 03/20/17  w R calf pain and after u/s found to have a " clot " Here today for f/u. Per pt he went to [facility] this am and saw a Dr or Pa whose name he cant recall but stated they called in Lisinopril today to his pharmacy. Per pt plans to pick it up today. " Ive been a bad boy not taking my meds " stated pt. Laughing and matter of fact re non compliance.  Dr Janese Banks informed

## 2017-03-22 NOTE — Telephone Encounter (Signed)
Called acute care and spoke to [facility] and asked her about pt. Coming in today. He has been off his b/p meds and diabetes meds for a year.  His b/p was elevated at acute care and he had a new patient appt here with our office because he got dx with blood clot in ER. His b/p here was 183/118 repeat 165/118 and after visit b/p 157/105. I checked with her and she checked with Dr. Darlin Priestly and he said that he gave him rx for b/p and if we are still concerned pt can go to ER.  I then called his PCP Dr. Ginette Pitman and explained the situation and they have a cancellation 9/12 at 2:15 and I took the appt and I told pt above. He does not want to go to ER, He understands that if he has HA, feels numness or tingling or strange speech or chest pain he will go to ER.  He is going to get medication filled and start taking his b/p med and was happy to get PCP appt moved up to next week

## 2017-03-27 DIAGNOSIS — Z794 Long term (current) use of insulin: Secondary | ICD-10-CM

## 2017-03-27 DIAGNOSIS — N182 Chronic kidney disease, stage 2 (mild): Secondary | ICD-10-CM

## 2017-03-27 DIAGNOSIS — E1122 Type 2 diabetes mellitus with diabetic chronic kidney disease: Secondary | ICD-10-CM

## 2017-03-27 DIAGNOSIS — Z86718 Personal history of other venous thrombosis and embolism: Secondary | ICD-10-CM | POA: Insufficient documentation

## 2017-03-27 HISTORY — DX: Type 2 diabetes mellitus with diabetic chronic kidney disease: E11.22

## 2017-03-27 HISTORY — DX: Type 2 diabetes mellitus with diabetic chronic kidney disease: N18.2

## 2017-03-30 ENCOUNTER — Inpatient Hospital Stay (HOSPITAL_COMMUNITY)
Admission: EM | Admit: 2017-03-30 | Discharge: 2017-04-12 | DRG: 853 | Disposition: A | Discharge status: Skilled Nursing Facility | Payer: BLUE CROSS/BLUE SHIELD | Attending: Internal Medicine | Admitting: Internal Medicine

## 2017-03-30 ENCOUNTER — Other Ambulatory Visit: Payer: Self-pay

## 2017-03-30 ENCOUNTER — Emergency Department (HOSPITAL_COMMUNITY): Payer: BLUE CROSS/BLUE SHIELD

## 2017-03-30 DIAGNOSIS — L03115 Cellulitis of right lower limb: Secondary | ICD-10-CM | POA: Diagnosis present

## 2017-03-30 DIAGNOSIS — Z794 Long term (current) use of insulin: Secondary | ICD-10-CM

## 2017-03-30 DIAGNOSIS — D62 Acute posthemorrhagic anemia: Secondary | ICD-10-CM | POA: Diagnosis not present

## 2017-03-30 DIAGNOSIS — L97209 Non-pressure chronic ulcer of unspecified calf with unspecified severity: Secondary | ICD-10-CM

## 2017-03-30 DIAGNOSIS — D638 Anemia in other chronic diseases classified elsewhere: Secondary | ICD-10-CM | POA: Diagnosis present

## 2017-03-30 DIAGNOSIS — E1140 Type 2 diabetes mellitus with diabetic neuropathy, unspecified: Secondary | ICD-10-CM | POA: Diagnosis present

## 2017-03-30 DIAGNOSIS — E1165 Type 2 diabetes mellitus with hyperglycemia: Secondary | ICD-10-CM | POA: Diagnosis present

## 2017-03-30 DIAGNOSIS — M726 Necrotizing fasciitis: Secondary | ICD-10-CM

## 2017-03-30 DIAGNOSIS — Z833 Family history of diabetes mellitus: Secondary | ICD-10-CM

## 2017-03-30 DIAGNOSIS — A419 Sepsis, unspecified organism: Secondary | ICD-10-CM | POA: Diagnosis not present

## 2017-03-30 DIAGNOSIS — Z823 Family history of stroke: Secondary | ICD-10-CM | POA: Diagnosis not present

## 2017-03-30 DIAGNOSIS — E669 Obesity, unspecified: Secondary | ICD-10-CM | POA: Diagnosis present

## 2017-03-30 DIAGNOSIS — Z7901 Long term (current) use of anticoagulants: Secondary | ICD-10-CM | POA: Diagnosis not present

## 2017-03-30 DIAGNOSIS — L97309 Non-pressure chronic ulcer of unspecified ankle with unspecified severity: Secondary | ICD-10-CM | POA: Diagnosis not present

## 2017-03-30 DIAGNOSIS — A4151 Sepsis due to Escherichia coli [E. coli]: Secondary | ICD-10-CM | POA: Diagnosis not present

## 2017-03-30 DIAGNOSIS — Z79899 Other long term (current) drug therapy: Secondary | ICD-10-CM | POA: Diagnosis not present

## 2017-03-30 DIAGNOSIS — E118 Type 2 diabetes mellitus with unspecified complications: Secondary | ICD-10-CM

## 2017-03-30 DIAGNOSIS — L97319 Non-pressure chronic ulcer of right ankle with unspecified severity: Secondary | ICD-10-CM | POA: Diagnosis present

## 2017-03-30 DIAGNOSIS — Z9114 Patient's other noncompliance with medication regimen: Secondary | ICD-10-CM | POA: Diagnosis not present

## 2017-03-30 DIAGNOSIS — N183 Chronic kidney disease, stage 3 (moderate): Secondary | ICD-10-CM | POA: Diagnosis present

## 2017-03-30 DIAGNOSIS — R509 Fever, unspecified: Secondary | ICD-10-CM

## 2017-03-30 DIAGNOSIS — I824Y1 Acute embolism and thrombosis of unspecified deep veins of right proximal lower extremity: Secondary | ICD-10-CM | POA: Diagnosis not present

## 2017-03-30 DIAGNOSIS — L97219 Non-pressure chronic ulcer of right calf with unspecified severity: Secondary | ICD-10-CM | POA: Diagnosis present

## 2017-03-30 DIAGNOSIS — N189 Chronic kidney disease, unspecified: Secondary | ICD-10-CM | POA: Diagnosis not present

## 2017-03-30 DIAGNOSIS — E8809 Other disorders of plasma-protein metabolism, not elsewhere classified: Secondary | ICD-10-CM | POA: Diagnosis present

## 2017-03-30 DIAGNOSIS — Z9119 Patient's noncompliance with other medical treatment and regimen: Secondary | ICD-10-CM

## 2017-03-30 DIAGNOSIS — I129 Hypertensive chronic kidney disease with stage 1 through stage 4 chronic kidney disease, or unspecified chronic kidney disease: Secondary | ICD-10-CM | POA: Diagnosis present

## 2017-03-30 DIAGNOSIS — E11622 Type 2 diabetes mellitus with other skin ulcer: Secondary | ICD-10-CM | POA: Diagnosis present

## 2017-03-30 DIAGNOSIS — E876 Hypokalemia: Secondary | ICD-10-CM | POA: Diagnosis present

## 2017-03-30 DIAGNOSIS — N179 Acute kidney failure, unspecified: Secondary | ICD-10-CM | POA: Diagnosis present

## 2017-03-30 DIAGNOSIS — I120 Hypertensive chronic kidney disease with stage 5 chronic kidney disease or end stage renal disease: Secondary | ICD-10-CM | POA: Diagnosis not present

## 2017-03-30 DIAGNOSIS — Z86718 Personal history of other venous thrombosis and embolism: Secondary | ICD-10-CM | POA: Diagnosis not present

## 2017-03-30 DIAGNOSIS — Z8249 Family history of ischemic heart disease and other diseases of the circulatory system: Secondary | ICD-10-CM

## 2017-03-30 DIAGNOSIS — E46 Unspecified protein-calorie malnutrition: Secondary | ICD-10-CM | POA: Diagnosis present

## 2017-03-30 DIAGNOSIS — E11621 Type 2 diabetes mellitus with foot ulcer: Secondary | ICD-10-CM | POA: Diagnosis not present

## 2017-03-30 DIAGNOSIS — I10 Essential (primary) hypertension: Secondary | ICD-10-CM | POA: Diagnosis present

## 2017-03-30 DIAGNOSIS — Z91148 Patient's other noncompliance with medication regimen for other reason: Secondary | ICD-10-CM

## 2017-03-30 DIAGNOSIS — E1122 Type 2 diabetes mellitus with diabetic chronic kidney disease: Secondary | ICD-10-CM | POA: Diagnosis present

## 2017-03-30 DIAGNOSIS — R739 Hyperglycemia, unspecified: Secondary | ICD-10-CM

## 2017-03-30 DIAGNOSIS — A4101 Sepsis due to Methicillin susceptible Staphylococcus aureus: Secondary | ICD-10-CM | POA: Diagnosis not present

## 2017-03-30 DIAGNOSIS — Z6833 Body mass index (BMI) 33.0-33.9, adult: Secondary | ICD-10-CM

## 2017-03-30 DIAGNOSIS — R000 Tachycardia, unspecified: Secondary | ICD-10-CM | POA: Diagnosis not present

## 2017-03-30 DIAGNOSIS — I160 Hypertensive urgency: Secondary | ICD-10-CM | POA: Diagnosis present

## 2017-03-30 DIAGNOSIS — I82401 Acute embolism and thrombosis of unspecified deep veins of right lower extremity: Secondary | ICD-10-CM | POA: Diagnosis present

## 2017-03-30 DIAGNOSIS — M7989 Other specified soft tissue disorders: Secondary | ICD-10-CM | POA: Diagnosis present

## 2017-03-30 DIAGNOSIS — B9561 Methicillin susceptible Staphylococcus aureus infection as the cause of diseases classified elsewhere: Secondary | ICD-10-CM | POA: Diagnosis present

## 2017-03-30 DIAGNOSIS — B9620 Unspecified Escherichia coli [E. coli] as the cause of diseases classified elsewhere: Secondary | ICD-10-CM | POA: Diagnosis present

## 2017-03-30 HISTORY — DX: Sepsis, unspecified organism: A41.9

## 2017-03-30 LAB — CBC WITH DIFFERENTIAL/PLATELET
Basophils Absolute: 0 10*3/uL (ref 0.0–0.1)
Basophils Relative: 0 %
Eosinophils Absolute: 0.1 10*3/uL (ref 0.0–0.7)
Eosinophils Relative: 0 %
HCT: 40.8 % (ref 39.0–52.0)
Hemoglobin: 13.5 g/dL (ref 13.0–17.0)
Lymphocytes Relative: 17 %
Lymphs Abs: 2.4 10*3/uL (ref 0.7–4.0)
MCH: 28.5 pg (ref 26.0–34.0)
MCHC: 33.1 g/dL (ref 30.0–36.0)
MCV: 86.3 fL (ref 78.0–100.0)
Monocytes Absolute: 1 10*3/uL (ref 0.1–1.0)
Monocytes Relative: 7 %
Neutro Abs: 10.2 10*3/uL — ABNORMAL HIGH (ref 1.7–7.7)
Neutrophils Relative %: 76 %
Platelets: 352 10*3/uL (ref 150–400)
RBC: 4.73 MIL/uL (ref 4.22–5.81)
RDW: 12.2 % (ref 11.5–15.5)
WBC: 13.6 10*3/uL — ABNORMAL HIGH (ref 4.0–10.5)

## 2017-03-30 LAB — I-STAT CG4 LACTIC ACID, ED
Lactic Acid, Venous: 2.29 mmol/L (ref 0.5–1.9)
Lactic Acid, Venous: 2.87 mmol/L (ref 0.5–1.9)
Lactic Acid, Venous: 1.66 mmol/L (ref 0.5–1.9)

## 2017-03-30 LAB — URINALYSIS, ROUTINE W REFLEX MICROSCOPIC
Bilirubin Urine: NEGATIVE
Glucose, UA: >=500 mg/dL — AB
Ketones, ur: 20 mg/dL — AB
Leukocytes, UA: NEGATIVE
Nitrite: NEGATIVE
Protein, ur: 30 mg/dL — AB
Specific Gravity, Urine: 1.016 (ref 1.005–1.030)
Squamous Epithelial / LPF: NONE SEEN
pH: 5 (ref 5.0–8.0)

## 2017-03-30 LAB — COMPREHENSIVE METABOLIC PANEL
ALT: 23 U/L (ref 17–63)
AST: 25 U/L (ref 15–41)
Albumin: 2.4 g/dL — ABNORMAL LOW (ref 3.5–5.0)
Alkaline Phosphatase: 107 U/L (ref 38–126)
Anion gap: 14 (ref 5–15)
BUN: 18 mg/dL (ref 6–20)
CO2: 21 mmol/L — ABNORMAL LOW (ref 22–32)
Calcium: 8.6 mg/dL — ABNORMAL LOW (ref 8.9–10.3)
Chloride: 94 mmol/L — ABNORMAL LOW (ref 101–111)
Creatinine, Ser: 1.89 mg/dL — ABNORMAL HIGH (ref 0.61–1.24)
GFR calc Af Amer: 48 mL/min — ABNORMAL LOW (ref >60)
GFR calc non Af Amer: 41 mL/min — ABNORMAL LOW (ref >60)
Glucose, Bld: 521 mg/dL (ref 65–99)
Potassium: 4.4 mmol/L (ref 3.5–5.1)
Sodium: 129 mmol/L — ABNORMAL LOW (ref 135–145)
Total Bilirubin: 0.7 mg/dL (ref 0.3–1.2)
Total Protein: 7.9 g/dL (ref 6.5–8.1)

## 2017-03-30 LAB — SEDIMENTATION RATE: Sed Rate: 108 mm/hr — ABNORMAL HIGH (ref 0–16)

## 2017-03-30 LAB — CBG MONITORING, ED: Glucose-Capillary: 447 mg/dL — ABNORMAL HIGH (ref 65–99)

## 2017-03-30 LAB — C-REACTIVE PROTEIN: CRP: 27.2 mg/dL — ABNORMAL HIGH (ref <1.0)

## 2017-03-30 MED ORDER — RIVAROXABAN (XARELTO) VTE STARTER PACK (15 & 20 MG): 15.0000 mg | ORAL_TABLET | ORAL | Status: DC

## 2017-03-30 MED ORDER — HYDROCODONE-ACETAMINOPHEN 5-325 MG PO TABS
1.0000 | ORAL_TABLET | ORAL | Status: DC | PRN
  Administered 2017-03-31 – 2017-04-12 (×11): 1 via ORAL
  Filled 2017-03-30 (×11): qty 1

## 2017-03-30 MED ORDER — INSULIN ASPART 100 UNIT/ML ~~LOC~~ SOLN
0.0000 [IU] | SUBCUTANEOUS | Status: DC
  Administered 2017-03-31: 5 [IU] via SUBCUTANEOUS
  Administered 2017-03-31: 8 [IU] via SUBCUTANEOUS
  Filled 2017-03-30 (×2): qty 1

## 2017-03-30 MED ORDER — SODIUM CHLORIDE 0.9 % IV BOLUS (SEPSIS)
1000.0000 mL | Freq: Once | INTRAVENOUS | Status: AC
  Administered 2017-03-31: 1000 mL via INTRAVENOUS

## 2017-03-30 MED ORDER — SODIUM CHLORIDE 0.9 % IV SOLN
INTRAVENOUS | Status: DC
  Administered 2017-03-30 – 2017-03-31 (×3): via INTRAVENOUS

## 2017-03-30 MED ORDER — ACETAMINOPHEN 325 MG PO TABS
650.0000 mg | ORAL_TABLET | Freq: Once | ORAL | Status: AC
  Administered 2017-03-30: 650 mg via ORAL

## 2017-03-30 MED ORDER — INSULIN ASPART 100 UNIT/ML ~~LOC~~ SOLN
8.0000 [IU] | Freq: Once | SUBCUTANEOUS | Status: AC
  Administered 2017-03-31: 8 [IU] via INTRAVENOUS
  Filled 2017-03-30: qty 1

## 2017-03-30 MED ORDER — ONDANSETRON HCL 4 MG/2ML IJ SOLN: 4.0000 mg | Freq: Four times a day (QID) | INTRAMUSCULAR | Status: DC | PRN

## 2017-03-30 MED ORDER — SODIUM CHLORIDE 0.9 % IV BOLUS (SEPSIS)
500.0000 mL | Freq: Once | INTRAVENOUS | Status: AC
  Administered 2017-03-31: 500 mL via INTRAVENOUS

## 2017-03-30 MED ORDER — VANCOMYCIN HCL IN DEXTROSE 1-5 GM/200ML-% IV SOLN
1000.0000 mg | Freq: Two times a day (BID) | INTRAVENOUS | Status: DC
  Administered 2017-03-31: 1000 mg via INTRAVENOUS
  Filled 2017-03-30: qty 200

## 2017-03-30 MED ORDER — SODIUM CHLORIDE 0.9 % IV BOLUS (SEPSIS)
1000.0000 mL | Freq: Once | INTRAVENOUS | Status: AC
  Administered 2017-03-30: 1000 mL via INTRAVENOUS

## 2017-03-30 MED ORDER — VANCOMYCIN HCL IN DEXTROSE 1-5 GM/200ML-% IV SOLN
1000.0000 mg | Freq: Once | INTRAVENOUS | Status: DC
  Filled 2017-03-30: qty 200

## 2017-03-30 MED ORDER — ACETAMINOPHEN 325 MG PO TABS
325.0000 mg | ORAL_TABLET | Freq: Once | ORAL | Status: DC
Start: 2017-03-30 — End: 2017-03-30

## 2017-03-30 MED ORDER — ONDANSETRON HCL 4 MG PO TABS: 4.0000 mg | ORAL_TABLET | Freq: Four times a day (QID) | ORAL | Status: DC | PRN

## 2017-03-30 MED ORDER — MORPHINE SULFATE (PF) 4 MG/ML IV SOLN
INTRAVENOUS | Status: AC
  Filled 2017-03-30: qty 1

## 2017-03-30 MED ORDER — MORPHINE SULFATE (PF) 4 MG/ML IV SOLN
4.0000 mg | Freq: Once | INTRAVENOUS | Status: AC
  Administered 2017-03-30: 4 mg via INTRAVENOUS

## 2017-03-30 MED ORDER — PIPERACILLIN-TAZOBACTAM 3.375 G IVPB 30 MIN
3.3750 g | Freq: Once | INTRAVENOUS | Status: AC
  Administered 2017-03-30: 3.375 g via INTRAVENOUS
  Filled 2017-03-30: qty 50

## 2017-03-30 MED ORDER — VANCOMYCIN HCL 10 G IV SOLR
1500.0000 mg | INTRAVENOUS | Status: AC
  Administered 2017-03-30: 1500 mg via INTRAVENOUS
  Filled 2017-03-30: qty 1500

## 2017-03-30 MED ORDER — PIPERACILLIN-TAZOBACTAM 3.375 G IVPB
3.3750 g | Freq: Three times a day (TID) | INTRAVENOUS | Status: DC
  Administered 2017-03-31 – 2017-04-04 (×12): 3.375 g via INTRAVENOUS
  Filled 2017-03-30 (×17): qty 50

## 2017-03-30 MED ORDER — ACETAMINOPHEN 325 MG PO TABS
650.0000 mg | ORAL_TABLET | Freq: Four times a day (QID) | ORAL | Status: DC | PRN
  Administered 2017-03-31 – 2017-04-03 (×7): 650 mg via ORAL
  Filled 2017-03-30 (×6): qty 2

## 2017-03-30 MED ORDER — HYDRALAZINE HCL 20 MG/ML IJ SOLN: 10.0000 mg | INTRAMUSCULAR | Status: DC | PRN

## 2017-03-30 MED ORDER — ACETAMINOPHEN 650 MG RE SUPP: 650.0000 mg | Freq: Four times a day (QID) | RECTAL | Status: DC | PRN

## 2017-03-30 MED ORDER — CYCLOBENZAPRINE HCL 5 MG PO TABS
5.0000 mg | ORAL_TABLET | Freq: Three times a day (TID) | ORAL | Status: DC | PRN
  Administered 2017-04-02 – 2017-04-04 (×3): 5 mg via ORAL
  Filled 2017-03-30 (×4): qty 1

## 2017-03-30 NOTE — ED Triage Notes (Addendum)
Pt has a known blood clot to right leg from 9/5. Pt states he is on xarelto. Pt states he cut the back of his calf a few months ago. States since being on the xarelto he has had increased bleeding to wound to back of right calf. Dressing noted to be saturated in blood, states he changed it x 1 today. Denies chest pain denies weakness or dizziness. States pain to right wound is now 10/10

## 2017-03-30 NOTE — ED Provider Notes (Signed)
[facility] DEPT Provider Note   CSN: [id number] Arrival date & time: 03/30/17  1846     History   Chief Complaint Chief Complaint  Patient presents with  . complications from [place]  . Wound Infection    HPI [name] is a 46 y.o. male.  HPI Pt comes in with cc wound infection. Pt has hx of DM, DVT. Pt injured his leg 2-3 months ago and has been taking care of the wound himself. Few days ago he started spontaneously bleeding in the LLE and he went to the ER, and was found to have DVT. Pt then went to his pcp and was started on keflex.  He comes to the ER today because of fevers and worsening pain.   Past Medical History:  Diagnosis Date  . Diabetes mellitus without complication ([place])   . DVT of proximal lower limb ([place]) 03/20/2017   R calf area  . Gout   . Hypertension     Patient Active Problem List   Diagnosis Date Noted  . Sepsis ([place]) 03/30/2017  . HTN (hypertension) 03/22/2017  . Diabetes ([place]) 03/22/2017    Past Surgical History:  Procedure Laterality Date  . SCROTAL SURGERY  2006       Home Medications    Prior to Admission medications   Medication Sig Start Date End Date Taking? Authorizing Provider  cephALEXin (KEFLEX) 500 MG capsule Take 500 mg by mouth 3 (three) times daily. 14 day course started 03/27/17 pm 03/27/17  Yes [provider]  ibuprofen (ADVIL,MOTRIN) 200 MG tablet Take 400 mg by mouth 2 (two) times daily as needed (pain).   Yes [provider]  lisinopril (PRINIVIL,ZESTRIL) 10 MG tablet Take 10 mg by mouth daily. 03/22/17  Yes [provider]  Rivaroxaban 15 & 20 MG TBPK Take as directed on package: Start with one 15mg  tablet by mouth twice a day with food. On Day 22, switch to one 20mg  tablet once a day with food. Patient taking differently: Take 15-20 mg by mouth See admin instructions. Take as directed on package - start date 03/20/17: Start with one 15mg  tablet by mouth twice a day with food (for 21  days);  On Day 22, switch to one 20mg  tablet once a day with food. 03/20/17  Yes Loney Hering, MD  indomethacin (INDOCIN) 25 MG capsule Take 1 capsule (25 mg total) by mouth 2 (two) times daily with a meal. Patient not taking: Reported on 03/22/2017 02/29/16   Joretta Bachelor, PA    Family History Family History  Problem Relation Age of Onset  . Hypertension Mother   . Stroke Mother   . Diabetes Father   . Renal Disease Father   . Hypertension Sister   . Diabetes Sister   . Renal Disease Sister   . Iron deficiency Sister     Social History Social History  Substance Use Topics  . Smoking status: Never Smoker  . Smokeless tobacco: Never Used  . Alcohol use No     Allergies   Patient has no known allergies.   Review of Systems Review of Systems  All other systems reviewed and are negative.    Physical Exam Updated Vital Signs BP (!) 183/109   Pulse (!) 109   Temp (!) 102 F (38.9 C) (Oral)   Resp (!) 23   Wt 105.9 kg (233 lb 8 oz)   SpO2 97%   BMI 31.28 kg/m   Physical Exam  Constitutional: He is  oriented to person, place, and time. He appears well-developed.  HENT:  Head: Atraumatic.  Neck: Neck supple.  Cardiovascular: Normal rate.   Pulmonary/Chest: Effort normal.  Abdominal: Soft.  Musculoskeletal: He exhibits edema and tenderness.  RLE swelling. Skin In the RLE is hyperpigmented, and likely necrotic. No crepitus  Neurological: He is alert and oriented to person, place, and time.  Skin: Skin is warm.  Nursing note and vitals reviewed.    ED Treatments / Results  Labs (all labs ordered are listed, but only abnormal results are displayed) Labs Reviewed  COMPREHENSIVE METABOLIC PANEL - Abnormal; Notable for the following:       Result Value   Sodium 129 (*)    Chloride 94 (*)    CO2 21 (*)    Glucose, Bld 521 (*)    Creatinine, Ser 1.89 (*)    Calcium 8.6 (*)    Albumin 2.4 (*)    GFR calc non Af Amer 41 (*)    GFR calc Af Amer 48  (*)    All other components within normal limits  CBC WITH DIFFERENTIAL/PLATELET - Abnormal; Notable for the following:    WBC 13.6 (*)    Neutro Abs 10.2 (*)    All other components within normal limits  C-REACTIVE PROTEIN - Abnormal; Notable for the following:    CRP 27.2 (*)    All other components within normal limits  SEDIMENTATION RATE - Abnormal; Notable for the following:    Sed Rate 108 (*)    All other components within normal limits  URINALYSIS, ROUTINE W REFLEX MICROSCOPIC - Abnormal; Notable for the following:    Color, Urine STRAW (*)    Glucose, UA >=500 (*)    Hgb urine dipstick MODERATE (*)    Ketones, ur 20 (*)    Protein, ur 30 (*)    Bacteria, UA RARE (*)    All other components within normal limits  I-STAT CG4 LACTIC ACID, ED - Abnormal; Notable for the following:    Lactic Acid, Venous 2.29 (*)    All other components within normal limits  I-STAT CG4 LACTIC ACID, ED - Abnormal; Notable for the following:    Lactic Acid, Venous 2.87 (*)    All other components within normal limits  CBG MONITORING, ED - Abnormal; Notable for the following:    Glucose-Capillary 447 (*)    All other components within normal limits  CULTURE, BLOOD (ROUTINE X 2)  CULTURE, BLOOD (ROUTINE X 2)  AEROBIC CULTURE (SUPERFICIAL SPECIMEN)  HIV ANTIBODY (ROUTINE TESTING)  HEMOGLOBIN A1C  PREALBUMIN  CBC  BASIC METABOLIC PANEL  I-STAT CG4 LACTIC ACID, ED  I-STAT CG4 LACTIC ACID, ED  CBG MONITORING, ED    EKG  EKG Interpretation  Date/Time:  Saturday March 30 2017 23:18:36 EDT Ventricular Rate:  110 PR Interval:    QRS Duration: 86 QT Interval:  302 QTC Calculation: 409 R Axis:   20 Text Interpretation:  Sinus tachycardia Otherwise within normal limits No old tracing to compare Confirmed by Delora Fuel ([id number]) on 03/30/2017 11:37:00 PM       Radiology Dg Tibia/fibula Right  Result Date: 03/30/2017 CLINICAL DATA:  Laceration of the calf a few months ago. Patient is  on Xarelto for blood clot and has increased bleeding of the wound in the back of the right calf. Rule out free air. EXAM: RIGHT TIBIA AND FIBULA - 2 VIEW COMPARISON:  None. FINDINGS: There is generalized mild soft tissue induration and swelling of the  right leg. There is a soft tissue laceration is seen along the lateral aspect of the leg at the junction of the middle and distal third. No soft tissue emphysema is seen. No acute osseous abnormality is noted. Distal intraosseous membrane ossifications are seen between the tibia and fibula. IMPRESSION: Generalized soft tissue swelling of the right leg with soft tissue laceration at the junction of the middle and distal third laterally. No soft tissue emphysema or suspicious osseous abnormality. Electronically Signed   By: Ashley Royalty M.D.   On: 03/30/2017 22:38    Procedures Procedures (including critical care time)  Medications Ordered in ED Medications  piperacillin-tazobactam (ZOSYN) IVPB 3.375 g (not administered)  vancomycin (VANCOCIN) IVPB 1000 mg/200 mL premix (not administered)  Rivaroxaban TBPK 15 mg (not administered)  0.9 %  sodium chloride infusion ( Intravenous New Bag/Given 03/30/17 2316)  ondansetron (ZOFRAN) tablet 4 mg (not administered)    Or  ondansetron (ZOFRAN) injection 4 mg (not administered)  acetaminophen (TYLENOL) tablet 650 mg (not administered)    Or  acetaminophen (TYLENOL) suppository 650 mg (not administered)  sodium chloride 0.9 % bolus 1,000 mL (0 mLs Intravenous Stopped 03/31/17 0011)    And  sodium chloride 0.9 % bolus 1,000 mL (not administered)    And  sodium chloride 0.9 % bolus 1,000 mL (1,000 mLs Intravenous New Bag/Given 03/31/17 0011)    And  sodium chloride 0.9 % bolus 500 mL (not administered)  insulin aspart (novoLOG) injection 0-15 Units (0 Units Subcutaneous Hold 03/31/17 0001)  cyclobenzaprine (FLEXERIL) tablet 5 mg (not administered)  hydrALAZINE (APRESOLINE) injection 10 mg (not administered)    HYDROcodone-acetaminophen (NORCO/VICODIN) 5-325 MG per tablet 1 tablet (not administered)  piperacillin-tazobactam (ZOSYN) IVPB 3.375 g (0 g Intravenous Stopped 03/30/17 2114)  vancomycin (VANCOCIN) 1,500 mg in sodium chloride 0.9 % 500 mL IVPB (0 mg Intravenous Stopped 03/30/17 2311)  acetaminophen (TYLENOL) tablet 650 mg (650 mg Oral Given 03/30/17 2141)  morphine 4 MG/ML injection 4 mg (4 mg Intravenous Given 03/30/17 2316)  insulin aspart (novoLOG) injection 8 Units (8 Units Intravenous Given 03/31/17 0009)     Initial Impression / Assessment and Plan / ED Course  I have reviewed the triage vital signs and the nursing notes.  Pertinent labs & imaging results that were available during my care of the patient were reviewed by me and considered in my medical decision making (see chart for details).     Likely RLE cellulitis, with necrosis and needs wound care. IVAB started. PT also hyperglycemic.  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

## 2017-03-30 NOTE — Progress Notes (Signed)
Pharmacy Antibiotic Note  [name] is a 46 y.o. male admitted on 03/30/2017 with sepsis.  Pharmacy has been consulted for Vancomycin / Zosyn dosing.  Plan: Zosyn 3.375 grams iv Q q 8 hours (1 st dose ordered in ED) Vancomycin 1500 mg iv x 1 then 1 gram iv Q12 Follow up cultures, progress, Scr     Temp (24hrs), Avg:100.2 F (37.9 C), Min:100.2 F (37.9 C), Max:100.2 F (37.9 C)   Recent Labs Lab 03/30/17 1859 03/30/17 1920  WBC 13.6*  --   CREATININE 1.89*  --   LATICACIDVEN  --  2.29*    Estimated Creatinine Clearance: 62.7 mL/min (A) (by C-G formula based on SCr of 1.89 mg/dL (H)).    No Known Allergies  Thank you for allowing pharmacy to be a part of this patient's care. Anette Guarneri, PharmD [phone]  03/30/2017 8:43 PM

## 2017-03-30 NOTE — ED Notes (Signed)
Notified Dr. Tamala Julian of CBG greater than 400.

## 2017-03-30 NOTE — ED Notes (Signed)
Reports noncompliance with insulin in the last year.

## 2017-03-30 NOTE — ED Notes (Signed)
Patient transported to X-ray 

## 2017-03-30 NOTE — H&P (Signed)
History and Physical    MEHRAN GUDERIAN DIY:[id number] DOB: [birth year] DOA: 03/30/2017  Referring MD/NP/PA: Dr. Mariane Masters PCP: Azzie Glatter, MD Patient coming from: Home  Chief Complaint: Right leg wound  HPI: [name] is a 46 y.o. male with medical history significant of DM type II, HTN, DVT on Xarelto; who presents with complaints of a wound of his right leg. Patient notes that a proximally 3 months ago that he had sustained a cut to his right calf. During that time he notes that he had been doing his best to keep the wound clean and placing bandages over as needed. However patient had returned from a trip from [place] 12 days ago, and reported acute swelling of the right leg from the knee down. He reports significant pain out of his right leg. Walking seem to worsen pain symptoms, and he went to the emergency department on 9/5. At that time he was found to have a DVT in the right lower extremity and was started on Xarelto. Since that time patient taking and take Xarelto as prescribed. However, the wound started to have more "dark nasty" discharge and the pain worsened. He had been taking NSAIDs as needed for pain. Other associated symptoms included fever, chills, and urinary frequency. Denies any significant chest pain or shortness of breath. Lastly, patient notes that he just established care with a primary care provider 3 days ago  ED Course: Upon admission into the emergency department patient was seen to be febrile up to 102F, pulse 88-110, respirations of 29, blood pressure elevated as high as 185/119, and O2 saturations maintained on room air. Labs revealed WBC 13.6, sodium 129, BUN 18, creatinine 1.89, lactic acid 2.87, and CRP 27.2. X-rays of the right leg soft tissue swelling with no osseous involvement. Sepsis protocol was started, blood cultures were obtained, and the patient was started on empiric antibiotic vancomycin and Zosyn. TRH called to  admit.   Review of Systems  Constitutional: Positive for chills, fever and malaise/fatigue.  HENT: Negative for ear discharge and ear pain.   Eyes: Negative for photophobia and pain.  Respiratory: Negative for sputum production and shortness of breath.   Cardiovascular: Positive for leg swelling. Negative for chest pain and palpitations.  Gastrointestinal: Negative for constipation and diarrhea.  Genitourinary: Positive for frequency. Negative for dysuria.  Musculoskeletal: Positive for joint pain and myalgias.  Skin: Positive for rash. Negative for itching.  Neurological: Negative for speech change and seizures.  Endo/Heme/Allergies: Positive for polydipsia.  Psychiatric/Behavioral: Negative for substance abuse. The patient is not nervous/anxious.     Past Medical History:  Diagnosis Date  . Diabetes mellitus without complication ([place])   . DVT of proximal lower limb ([place]) 03/20/2017   R calf area  . Gout   . Hypertension     Past Surgical History:  Procedure Laterality Date  . SCROTAL SURGERY  2006     reports that he has never smoked. He has never used smokeless tobacco. He reports that he does not drink alcohol or use drugs.  No Known Allergies  Family History  Problem Relation Age of Onset  . Hypertension Mother   . Stroke Mother   . Diabetes Father   . Renal Disease Father   . Hypertension Sister   . Diabetes Sister   . Renal Disease Sister   . Iron deficiency Sister     Prior to Admission medications   Medication Sig Start Date End Date Taking? Authorizing Provider  cephALEXin (  KEFLEX) 500 MG capsule Take 500 mg by mouth 3 (three) times daily. 14 day course started 03/27/17 pm 03/27/17  Yes [provider]  ibuprofen (ADVIL,MOTRIN) 200 MG tablet Take 400 mg by mouth 2 (two) times daily as needed (pain).   Yes [provider]  lisinopril (PRINIVIL,ZESTRIL) 10 MG tablet Take 10 mg by mouth daily. 03/22/17  Yes [provider]   Rivaroxaban 15 & 20 MG TBPK Take as directed on package: Start with one 62m tablet by mouth twice a day with food. On Day 22, switch to one 28mtablet once a day with food. Patient taking differently: Take 15-20 mg by mouth See admin instructions. Take as directed on package - start date 03/20/17: Start with one 1574mablet by mouth twice a day with food (for 21 days);  On Day 22, switch to one 69m40mblet once a day with food. 03/20/17  Yes WebsLoney Hering  indomethacin (INDOCIN) 25 MG capsule Take 1 capsule (25 mg total) by mouth 2 (two) times daily with a meal. Patient not taking: Reported on 03/22/2017 02/29/16   EnglJoretta Bachelor  [place] Exam:  Constitutional:Obese male who appears chronically ill  Vitals:   03/30/17 1856 03/30/17 1947 03/30/17 2100 03/30/17 2101  BP: (!) 150/106 (!) 168/106  (!) 185/119  Pulse: (S) (!) 110 100  88  Resp: 18 (!) 26  20  Temp: (S) 100.2 F (37.9 C)     TempSrc: Oral     SpO2: 98% 99%  99%  Weight:   105.9 kg (233 lb 8 oz)    Eyes: PERRL, lids and conjunctivae normal ENMT: Mucous membranes are dry. Posterior pharynx clear of any exudate or lesions. Poor dentition  Neck: normal, supple, no masses, no thyromegaly Respiratory:Mildly tachypneic, but clear to aeration the patient able to talk in complete sentences  Cardiovascular:Tachycardic, no murmurs / rubs / gallops. No extremity edema. 2+ pedal pulses. No carotid bruits.  Abdomen: no tenderness, no masses palpated. No hepatosplenomegaly. Bowel sounds positive.  Musculoskeletal: no clubbing / cyanosis. No joint deformity upper and lower extremities. Good ROM, no contractures. Normal muscle tone.  Skin: 2 cm ulceration present of the right lateral calf with significant swelling present.  Neurologic: CN 2-12 grossly intact. Sensation intact, DTR normal. Strength 5/5 in all 4.  Psychiatric: Normal judgment and insight. Alert and oriented x 3. Normal mood.     Labs on Admission: I have  personally reviewed following labs and imaging studies  CBC:  Recent Labs Lab 03/30/17 1859  WBC 13.6*  NEUTROABS 10.2*  HGB 13.5  HCT 40.8  MCV 86.3  PLT 352 149asic Metabolic Panel:  Recent Labs Lab 03/30/17 1859  NA 129*  K 4.4  CL 94*  CO2 21*  GLUCOSE 521*  BUN 18  CREATININE 1.89*  CALCIUM 8.6*   GFR: Estimated Creatinine Clearance: 62.5 mL/min (A) (by C-G formula based on SCr of 1.89 mg/dL (H)). Liver Function Tests:  Recent Labs Lab 03/30/17 1859  AST 25  ALT 23  ALKPHOS 107  BILITOT 0.7  PROT 7.9  ALBUMIN 2.4*   No results for input(s): LIPASE, AMYLASE in the last 168 hours. No results for input(s): AMMONIA in the last 168 hours. Coagulation Profile: No results for input(s): INR, PROTIME in the last 168 hours. Cardiac Enzymes: No results for input(s): CKTOTAL, CKMB, CKMBINDEX, TROPONINI in the last 168 hours. BNP (last 3 results) No results for input(s): PROBNP in the last  8760 hours. HbA1C: No results for input(s): HGBA1C in the last 72 hours. CBG: No results for input(s): GLUCAP in the last 168 hours. Lipid Profile: No results for input(s): CHOL, HDL, LDLCALC, TRIG, CHOLHDL, LDLDIRECT in the last 72 hours. Thyroid Function Tests: No results for input(s): TSH, T4TOTAL, FREET4, T3FREE, THYROIDAB in the last 72 hours. Anemia Panel: No results for input(s): VITAMINB12, FOLATE, FERRITIN, TIBC, IRON, RETICCTPCT in the last 72 hours. Urine analysis: No results found for: COLORURINE, APPEARANCEUR, LABSPEC, PHURINE, GLUCOSEU, HGBUR, BILIRUBINUR, KETONESUR, PROTEINUR, UROBILINOGEN, NITRITE, LEUKOCYTESUR Sepsis Labs: No results found for this or any previous visit (from the past 240 hour(s)).   Radiological Exams on Admission: No results found.  EKG: Independently reviewed. Sinus tachycardia 110 bpm  Assessment/Plan Sepsis 2/2 Diabetic ulcer of the right calf: Acute. Patient presents with low-grade fever 100.66F, tachycardic, and tachypneic with  worsening right leg ulcer.  WBC was 13.6 and Lactic acid  level was elevated to 2.87. Patient was noted to have elevated CRP of 27.2. X-ray of the leg imaging only showed soft tissue swelling with no osseous involvement.Patient was started on empiric antibiotics of vancomycin and Zosyn and blood cultures were obtained. - Admit to stepdown - Lower extremity wound order set initiated - Sepsis protocol initiated with fluid bolus of 3500 ml , then place on rate 1066m/hr - Follow-up blood and wound cultures - Continue empiric antibiotics of vancomycin and Zosyn, de-escalate her medically appropriate - Follow-up ESR - Wound care consult - May likely need surgery consultation for evaluation for need of debridement  Hyperglycemia  2/2 diabetes mellitus type 2, uncontrolled: Patient seen to have blood sugar size 521 on admission without elevated anion gap. Patient not on any diabetic medications since June 2017.  - Hypoglycemic protocols - Follow-up hemoglobin A1c - Diabetic education in a.m - CBGs every 4 hours with a moderate SSI - Social work/case management per issues with obtaining medications  Acute kidney injury on chronic kidney disease stage III: Baseline creatinine previously 1.4 on 03/22/2017. Patient presents with creatinine 1.89 and BUN 18 on admission. Urine specific gravity appears to be concentrated. NSAID use and also be also contributing factor.  - IVFs as seen above - Hold nephrotoxic agents - Recheck BMP in a.m.   Hypertensive urgency: Blood pressure seen to be elevated up to 185/119 on admission Suspect aspect of this could be pain driven. - Hold lisinopril  - Hydralazine IV prn  DVT of the right leg: Diagnosed on 9/5 and currently being treated with Xarelto. - Continue Xarelto   Hypoalbuminemia: Acute. Albumin noted to be 2.4 on admission - Follow-up prealbumin in a.m.  DVT prophylaxis: Xarelto  Code Status: Full Family Communication:  discussed plan of care with patient  and family present at bedside  Disposition Plan: TBD Consults called: none Admission status: Inpatient  RNorval MortonMD [facility] Pager [phone]  If 7PM-7AM, please contact night-coverage [email or web address] Password [email or web address] 03/30/2017, 10:04 PM

## 2017-03-31 ENCOUNTER — Encounter (HOSPITAL_COMMUNITY): Payer: Self-pay | Admitting: Family Medicine

## 2017-03-31 DIAGNOSIS — E11622 Type 2 diabetes mellitus with other skin ulcer: Secondary | ICD-10-CM

## 2017-03-31 DIAGNOSIS — R000 Tachycardia, unspecified: Secondary | ICD-10-CM | POA: Diagnosis present

## 2017-03-31 DIAGNOSIS — E8809 Other disorders of plasma-protein metabolism, not elsewhere classified: Secondary | ICD-10-CM

## 2017-03-31 DIAGNOSIS — I82401 Acute embolism and thrombosis of unspecified deep veins of right lower extremity: Secondary | ICD-10-CM

## 2017-03-31 DIAGNOSIS — Z7901 Long term (current) use of anticoagulants: Secondary | ICD-10-CM

## 2017-03-31 DIAGNOSIS — L03115 Cellulitis of right lower limb: Secondary | ICD-10-CM | POA: Diagnosis present

## 2017-03-31 DIAGNOSIS — N189 Chronic kidney disease, unspecified: Secondary | ICD-10-CM

## 2017-03-31 DIAGNOSIS — N179 Acute kidney failure, unspecified: Secondary | ICD-10-CM | POA: Diagnosis present

## 2017-03-31 DIAGNOSIS — L97209 Non-pressure chronic ulcer of unspecified calf with unspecified severity: Secondary | ICD-10-CM

## 2017-03-31 DIAGNOSIS — E1165 Type 2 diabetes mellitus with hyperglycemia: Secondary | ICD-10-CM | POA: Diagnosis present

## 2017-03-31 DIAGNOSIS — Z9114 Patient's other noncompliance with medication regimen: Secondary | ICD-10-CM

## 2017-03-31 HISTORY — DX: Cellulitis of right lower limb: L03.115

## 2017-03-31 HISTORY — DX: Type 2 diabetes mellitus with other skin ulcer: E11.622

## 2017-03-31 HISTORY — DX: Acute embolism and thrombosis of unspecified deep veins of right lower extremity: I82.401

## 2017-03-31 HISTORY — DX: Other disorders of plasma-protein metabolism, not elsewhere classified: E88.09

## 2017-03-31 HISTORY — DX: Long term (current) use of anticoagulants: Z79.01

## 2017-03-31 HISTORY — DX: Tachycardia, unspecified: R00.0

## 2017-03-31 LAB — CBG MONITORING, ED
Glucose-Capillary: 311 mg/dL — ABNORMAL HIGH (ref 65–99)
Glucose-Capillary: 277 mg/dL — ABNORMAL HIGH (ref 65–99)
Glucose-Capillary: 217 mg/dL — ABNORMAL HIGH (ref 65–99)
Glucose-Capillary: 167 mg/dL — ABNORMAL HIGH (ref 65–99)

## 2017-03-31 LAB — BASIC METABOLIC PANEL
Anion gap: 8 (ref 5–15)
BUN: 16 mg/dL (ref 6–20)
CO2: 23 mmol/L (ref 22–32)
Calcium: 8 mg/dL — ABNORMAL LOW (ref 8.9–10.3)
Chloride: 102 mmol/L (ref 101–111)
Creatinine, Ser: 1.43 mg/dL — ABNORMAL HIGH (ref 0.61–1.24)
GFR calc Af Amer: >60 mL/min (ref >60)
GFR calc non Af Amer: 58 mL/min — ABNORMAL LOW (ref >60)
Glucose, Bld: 296 mg/dL — ABNORMAL HIGH (ref 65–99)
Potassium: 4.5 mmol/L (ref 3.5–5.1)
Sodium: 133 mmol/L — ABNORMAL LOW (ref 135–145)

## 2017-03-31 LAB — GLUCOSE, CAPILLARY: Glucose-Capillary: 235 mg/dL — ABNORMAL HIGH (ref 65–99)

## 2017-03-31 LAB — CBC
HCT: 36.4 % — ABNORMAL LOW (ref 39.0–52.0)
Hemoglobin: 12 g/dL — ABNORMAL LOW (ref 13.0–17.0)
MCH: 28.6 pg (ref 26.0–34.0)
MCHC: 33 g/dL (ref 30.0–36.0)
MCV: 86.9 fL (ref 78.0–100.0)
Platelets: 322 10*3/uL (ref 150–400)
RBC: 4.19 MIL/uL — ABNORMAL LOW (ref 4.22–5.81)
RDW: 12.1 % (ref 11.5–15.5)
WBC: 14.7 10*3/uL — ABNORMAL HIGH (ref 4.0–10.5)

## 2017-03-31 LAB — PREALBUMIN: Prealbumin: <5 mg/dL — ABNORMAL LOW (ref 18–38)

## 2017-03-31 LAB — TSH: TSH: 1.084 u[IU]/mL (ref 0.350–4.500)

## 2017-03-31 MED ORDER — POLYETHYLENE GLYCOL 3350 17 G PO PACK
17.0000 g | PACK | Freq: Every day | ORAL | Status: DC
  Administered 2017-03-31 – 2017-04-11 (×8): 17 g via ORAL
  Filled 2017-03-31 (×12): qty 1

## 2017-03-31 MED ORDER — AMLODIPINE BESYLATE 5 MG PO TABS
5.0000 mg | ORAL_TABLET | Freq: Every day | ORAL | Status: DC
  Administered 2017-03-31 – 2017-04-03 (×4): 5 mg via ORAL
  Filled 2017-03-31 (×4): qty 1

## 2017-03-31 MED ORDER — INSULIN ASPART 100 UNIT/ML ~~LOC~~ SOLN
5.0000 [IU] | Freq: Three times a day (TID) | SUBCUTANEOUS | Status: DC
  Administered 2017-03-31 – 2017-04-01 (×4): 5 [IU] via SUBCUTANEOUS

## 2017-03-31 MED ORDER — ACETAMINOPHEN 325 MG PO TABS
650.0000 mg | ORAL_TABLET | Freq: Once | ORAL | Status: AC
  Administered 2017-03-31: 650 mg via ORAL
  Filled 2017-03-31: qty 2

## 2017-03-31 MED ORDER — RIVAROXABAN 15 MG PO TABS
15.0000 mg | ORAL_TABLET | Freq: Two times a day (BID) | ORAL | Status: DC
  Administered 2017-03-31 – 2017-04-01 (×3): 15 mg via ORAL
  Filled 2017-03-31 (×4): qty 1

## 2017-03-31 MED ORDER — VANCOMYCIN HCL 10 G IV SOLR
1500.0000 mg | Freq: Two times a day (BID) | INTRAVENOUS | Status: DC
  Administered 2017-03-31: 1500 mg via INTRAVENOUS
  Filled 2017-03-31 (×2): qty 1500

## 2017-03-31 MED ORDER — INSULIN ASPART 100 UNIT/ML ~~LOC~~ SOLN
0.0000 [IU] | Freq: Three times a day (TID) | SUBCUTANEOUS | Status: DC
  Administered 2017-03-31: 3 [IU] via SUBCUTANEOUS
  Administered 2017-03-31: 5 [IU] via SUBCUTANEOUS
  Administered 2017-04-01: 3 [IU] via SUBCUTANEOUS
  Administered 2017-04-01 – 2017-04-02 (×2): 5 [IU] via SUBCUTANEOUS
  Administered 2017-04-02 (×2): 3 [IU] via SUBCUTANEOUS
  Administered 2017-04-04: 5 [IU] via SUBCUTANEOUS
  Administered 2017-04-04 – 2017-04-05 (×3): 8 [IU] via SUBCUTANEOUS

## 2017-03-31 MED ORDER — IBUPROFEN 400 MG PO TABS
600.0000 mg | ORAL_TABLET | ORAL | Status: AC
  Administered 2017-03-31: 600 mg via ORAL
  Filled 2017-03-31: qty 1

## 2017-03-31 MED ORDER — RIVAROXABAN 20 MG PO TABS: 20.0000 mg | ORAL_TABLET | Freq: Every day | ORAL | Status: DC

## 2017-03-31 MED ORDER — MUPIROCIN CALCIUM 2 % EX CREA
TOPICAL_CREAM | Freq: Two times a day (BID) | CUTANEOUS | Status: DC
  Administered 2017-03-31: 22:00:00 via TOPICAL
  Administered 2017-03-31: 1 via TOPICAL
  Filled 2017-03-31: qty 15

## 2017-03-31 MED ORDER — INSULIN GLARGINE 100 UNIT/ML ~~LOC~~ SOLN
15.0000 [IU] | Freq: Every day | SUBCUTANEOUS | Status: DC
  Administered 2017-03-31 – 2017-04-02 (×3): 15 [IU] via SUBCUTANEOUS
  Filled 2017-03-31 (×4): qty 0.15

## 2017-03-31 NOTE — ED Notes (Addendum)
Attempted report 

## 2017-03-31 NOTE — ED Notes (Signed)
Ordered Breakfast Tray  

## 2017-03-31 NOTE — Plan of Care (Signed)
Problem: Skin Integrity: Goal: Skin integrity will improve Outcome: Progressing Wound care consult in place to assist

## 2017-03-31 NOTE — ED Notes (Signed)
Placed on hospital bed at this time 

## 2017-03-31 NOTE — ED Notes (Signed)
Text page sent to Dr. Tamala Julian regarding elevated temp.

## 2017-03-31 NOTE — ED Notes (Signed)
Pt had urinated all over himself and stated he was not aware.  He is ao x 4.  Bedding changed.  Wound dressed.

## 2017-03-31 NOTE — Discharge Instructions (Addendum)
Glucose goals for home: Before meals: 80-130 mg/dl 2-hours after meals: less than 180 mg/dl  Hemoglobin A1c goal: 7% of less (current A1c is 15.5% as of 03/31/17)    Information on my medicine - XARELTO (rivaroxaban)   WHY WAS XARELTO PRESCRIBED FOR YOU? Xarelto was prescribed to treat blood clots that may have been found in the veins of your legs (deep vein thrombosis) or in your lungs (pulmonary embolism) and to reduce the risk of them occurring again.  What do you need to know about Xarelto? The starting dose is one 15 mg tablet taken TWICE daily with food for the FIRST 21 DAYS then   the dose is changed to one 20 mg tablet taken ONCE A DAY with your evening meal.  DO NOT stop taking Xarelto without talking to the health care provider who prescribed the medication.  Refill your prescription for 20 mg tablets before you run out.  After discharge, you should have regular check-up appointments with your healthcare provider that is prescribing your Xarelto.  In the future your dose may need to be changed if your kidney function changes by a significant amount.  What do you do if you miss a dose? If you are taking Xarelto TWICE DAILY and you miss a dose, take it as soon as you remember. You may take two 15 mg tablets (total 30 mg) at the same time then resume your regularly scheduled 15 mg twice daily the next day.  If you are taking Xarelto ONCE DAILY and you miss a dose, take it as soon as you remember on the same day then continue your regularly scheduled once daily regimen the next day. Do not take two doses of Xarelto at the same time.   Important Safety Information Xarelto is a blood thinner medicine that can cause bleeding. You should call your healthcare provider right away if you experience any of the following: ? Bleeding from an injury or your nose that does not stop. ? Unusual colored urine (red or dark brown) or unusual colored stools (red or black). ? Unusual  bruising for unknown reasons. ? A serious fall or if you hit your head (even if there is no bleeding).  Some medicines may interact with Xarelto and might increase your risk of bleeding while on Xarelto. To help avoid this, consult your healthcare provider or pharmacist prior to using any new prescription or non-prescription medications, including herbals, vitamins, non-steroidal anti-inflammatory drugs (NSAIDs) and supplements.  This website has more information on Xarelto: [email or web address].[email or web address]

## 2017-03-31 NOTE — ED Notes (Addendum)
Notified Dr. Tamala Julian via text page of elevated temp - verbal order to give dose of 650MG  PO tylenol now.

## 2017-03-31 NOTE — Progress Notes (Signed)
PROGRESS NOTE  [name]  PPI:[id number]  DOB: July 07, 1971  DOA: 03/30/2017 PCP: Patient, No Pcp Per   Brief Admission Hx: 46 y/o male with poorly controlled insulin requiring diabetes, HTN, gout, CKD, recently diagnosed DVT RLE on xarelto who presented to ED with fever and poorly healing right calf leg wound.    MDM/Assessment & Plan:   1. Sepsis - secondary to cellulitis and infection of right leg calf wound - continue IV antibiotics, wound care, supportive care.  Trend lactic acid and WBC.  Follow vitals closely and monitor telemetry.  2. Right leg wound - I have requested a wound care nurse consultation.  He will likely need to follow up with outpatient wound care center for further management.   3. Type 2 diabetes mellitus, insulin requiring with neurological complications - Pt reports that he has not had insulin to take in over 1 year.  He recently established with a PCP and is willing to take insulin again.  Basal bolus insulin ordered.  Checking A1c.  Check TSH.  Monitor BS closely.  Diabetes education ordered in hospital.   4. Acute Kidney Injury on CKD (suspected) - IV fluids ordered for hydration, avoid nephrotoxins, follow renal function panel.  5. Essential hypertension - poorly controlled, recently started on lisinopril by newly established PCP, follow and adjust treatment as needed.  6. DVT of RLE - He is on xarelto and saw hematology recently and they are suggesting he be on anticoagulation indefinitely given his risk factors for DVT.  He will follow up in 6 months with hematology to discuss further.   7. Malnutrition of chronic disease - requesting dietitian consult.    DVT prophylaxis: Xarelto Code Status: Full  Family Communication: bedside Disposition Plan: telemetry  Consultants:  Wound care nurse  Subjective: Pt says that he feels hot.    Objective: Vitals:   03/31/17 0500 03/31/17 0600 03/31/17 0700 03/31/17 0757  BP: (!) 147/90 (!) 142/93 (!) 140/91    Pulse: (!) 101 (!) 103 (!) 106   Resp: (!) 26 (!) 23 16   Temp:    (!) 101.4 F (38.6 C)  TempSrc:      SpO2: 93% 93% 94%   Weight:        Intake/Output Summary (Last 24 hours) at 03/31/17 0630 Last data filed at 03/31/17 0103  Gross per 24 hour  Intake             4050 ml  Output             1100 ml  Net             2950 ml   Filed Weights   03/30/17 2100  Weight: 105.9 kg (233 lb 8 oz)     REVIEW OF SYSTEMS  As per history otherwise all reviewed and reported negative  Exam:  General exam: awake, alert, NAD. Cooperative.  Respiratory system: Clear. No increased work of breathing. Cardiovascular system: S1 & S2 heard, tachycardic. No JVD, murmurs, gallops, clicks or pedal edema. Gastrointestinal system: Abdomen is nondistended, soft and nontender. Normal bowel sounds heard. Central nervous system: Alert and oriented. No focal neurological deficits. Extremities: right leg calf wound:      Data Reviewed: Basic Metabolic Panel:  Recent Labs Lab 03/30/17 1859 03/31/17 0320  NA 129* 133*  K 4.4 4.5  CL 94* 102  CO2 21* 23  GLUCOSE 521* 296*  BUN 18 16  CREATININE 1.89* 1.43*  CALCIUM 8.6* 8.0*   Liver  Function Tests:  Recent Labs Lab 03/30/17 1859  AST 25  ALT 23  ALKPHOS 107  BILITOT 0.7  PROT 7.9  ALBUMIN 2.4*   No results for input(s): LIPASE, AMYLASE in the last 168 hours. No results for input(s): AMMONIA in the last 168 hours. CBC:  Recent Labs Lab 03/30/17 1859 03/31/17 0320  WBC 13.6* 14.7*  NEUTROABS 10.2*  --   HGB 13.5 12.0*  HCT 40.8 36.4*  MCV 86.3 86.9  PLT 352 322   Cardiac Enzymes: No results for input(s): CKTOTAL, CKMB, CKMBINDEX, TROPONINI in the last 168 hours. CBG (last 3)   Recent Labs  03/31/17 0309 03/31/17 0428 03/31/17 0724  GLUCAP 311* 277* 217*   Recent Results (from the past 240 hour(s))  Aerobic Culture (superficial specimen)     Status: None (Preliminary result)   Collection Time: 03/30/17 11:23  PM  Result Value Ref Range Status   Specimen Description WOUND RIGHT LEG  Final   Special Requests Normal  Final   Gram Stain   Final    RARE WBC PRESENT, PREDOMINANTLY PMN RARE GRAM POSITIVE COCCI IN PAIRS IN CLUSTERS RARE GRAM NEGATIVE RODS    Culture PENDING  Incomplete   Report Status PENDING  Incomplete     Studies: Dg Tibia/fibula Right  Result Date: 03/30/2017 CLINICAL DATA:  Laceration of the calf a few months ago. Patient is on Xarelto for blood clot and has increased bleeding of the wound in the back of the right calf. Rule out free air. EXAM: RIGHT TIBIA AND FIBULA - 2 VIEW COMPARISON:  None. FINDINGS: There is generalized mild soft tissue induration and swelling of the right leg. There is a soft tissue laceration is seen along the lateral aspect of the leg at the junction of the middle and distal third. No soft tissue emphysema is seen. No acute osseous abnormality is noted. Distal intraosseous membrane ossifications are seen between the tibia and fibula. IMPRESSION: Generalized soft tissue swelling of the right leg with soft tissue laceration at the junction of the middle and distal third laterally. No soft tissue emphysema or suspicious osseous abnormality. Electronically Signed   By: Ashley Royalty M.D.   On: 03/30/2017 22:38   Scheduled Meds: . insulin aspart  0-15 Units Subcutaneous TID WC  . insulin aspart  5 Units Subcutaneous TID WC  . insulin glargine  15 Units Subcutaneous Daily  . mupirocin cream   Topical BID  . polyethylene glycol  17 g Oral Daily  . Rivaroxaban  15 mg Oral BID WC  . [START ON 04/11/2017] rivaroxaban  20 mg Oral Q supper   Continuous Infusions: . sodium chloride 100 mL/hr at 03/30/17 2316  . piperacillin-tazobactam (ZOSYN)  IV 3.375 g (03/31/17 0556)  . vancomycin      Principal Problem:   Sepsis ([place]) Active Problems:   Hyperglycemia due to type 2 diabetes mellitus ([place])   Acute kidney injury superimposed on chronic kidney disease ([facility])    Right leg DVT ([facility])   Diabetic ulcer of calf ([place])   Noncompliance w/medication treatment due to intermit use of medication   Cellulitis of right lower leg   HTN (hypertension)   Hypoalbuminemia   Sinus tachycardia   Fever   Chronic anticoagulation  Time spent:   Irwin Brakeman, MD, FAAFP [facility] Pager [phone]  If 7PM-7AM, please contact night-coverage [email or web address] Password [email or web address] 03/31/2017, 9:22 AM    LOS: 1 day

## 2017-03-31 NOTE — Progress Notes (Signed)
Patient admitted to 2w26 from ED. Patient transported via bed with monitoring. Bedside report given by Hassan Rowan, RN. Central telemetry notified of patient monitor 2w26 with 2nd verification. Skin assessment completed with 2 RNs. Oriented to department. Assisted with lunch order. Admission complete. Bartholomew Crews, RN

## 2017-03-31 NOTE — ED Notes (Signed)
Consulted Dr. Tamala Julian regarding patient's duplicate insulin orders. RN to give 8 units subQ regular insulin per Dr. Tamala Julian.

## 2017-04-01 ENCOUNTER — Encounter (HOSPITAL_COMMUNITY): Payer: BLUE CROSS/BLUE SHIELD

## 2017-04-01 ENCOUNTER — Inpatient Hospital Stay (HOSPITAL_COMMUNITY): Payer: BLUE CROSS/BLUE SHIELD

## 2017-04-01 LAB — CBC WITH DIFFERENTIAL/PLATELET
Basophils Absolute: 0 10*3/uL (ref 0.0–0.1)
Basophils Relative: 0 %
Eosinophils Absolute: 0.2 10*3/uL (ref 0.0–0.7)
Eosinophils Relative: 1 %
HCT: 35.2 % — ABNORMAL LOW (ref 39.0–52.0)
Hemoglobin: 11.3 g/dL — ABNORMAL LOW (ref 13.0–17.0)
Lymphocytes Relative: 16 %
Lymphs Abs: 2.4 10*3/uL (ref 0.7–4.0)
MCH: 28.6 pg (ref 26.0–34.0)
MCHC: 32.1 g/dL (ref 30.0–36.0)
MCV: 89.1 fL (ref 78.0–100.0)
Monocytes Absolute: 1.4 10*3/uL — ABNORMAL HIGH (ref 0.1–1.0)
Monocytes Relative: 9 %
Neutro Abs: 11.2 10*3/uL — ABNORMAL HIGH (ref 1.7–7.7)
Neutrophils Relative %: 74 %
Platelets: 320 10*3/uL (ref 150–400)
RBC: 3.95 MIL/uL — ABNORMAL LOW (ref 4.22–5.81)
RDW: 12.4 % (ref 11.5–15.5)
WBC: 15.2 10*3/uL — ABNORMAL HIGH (ref 4.0–10.5)

## 2017-04-01 LAB — COMPREHENSIVE METABOLIC PANEL
ALT: 14 U/L — ABNORMAL LOW (ref 17–63)
AST: 11 U/L — ABNORMAL LOW (ref 15–41)
Albumin: 1.7 g/dL — ABNORMAL LOW (ref 3.5–5.0)
Alkaline Phosphatase: 66 U/L (ref 38–126)
Anion gap: 8 (ref 5–15)
BUN: 12 mg/dL (ref 6–20)
CO2: 25 mmol/L (ref 22–32)
Calcium: 8.2 mg/dL — ABNORMAL LOW (ref 8.9–10.3)
Chloride: 104 mmol/L (ref 101–111)
Creatinine, Ser: 1.9 mg/dL — ABNORMAL HIGH (ref 0.61–1.24)
GFR calc Af Amer: 48 mL/min — ABNORMAL LOW (ref >60)
GFR calc non Af Amer: 41 mL/min — ABNORMAL LOW (ref >60)
Glucose, Bld: 177 mg/dL — ABNORMAL HIGH (ref 65–99)
Potassium: 4 mmol/L (ref 3.5–5.1)
Sodium: 137 mmol/L (ref 135–145)
Total Bilirubin: 0.8 mg/dL (ref 0.3–1.2)
Total Protein: 6.3 g/dL — ABNORMAL LOW (ref 6.5–8.1)

## 2017-04-01 LAB — GLUCOSE, CAPILLARY
Glucose-Capillary: 180 mg/dL — ABNORMAL HIGH (ref 65–99)
Glucose-Capillary: 120 mg/dL — ABNORMAL HIGH (ref 65–99)
Glucose-Capillary: 175 mg/dL — ABNORMAL HIGH (ref 65–99)
Glucose-Capillary: 171 mg/dL — ABNORMAL HIGH (ref 65–99)
Glucose-Capillary: 251 mg/dL — ABNORMAL HIGH (ref 65–99)
Glucose-Capillary: 167 mg/dL — ABNORMAL HIGH (ref 65–99)

## 2017-04-01 LAB — HEMOGLOBIN A1C
Hgb A1c MFr Bld: >15.5 % — ABNORMAL HIGH (ref 4.8–5.6)
Mean Plasma Glucose: >398 mg/dL

## 2017-04-01 MED ORDER — VANCOMYCIN HCL 10 G IV SOLR
1250.0000 mg | INTRAVENOUS | Status: DC
  Administered 2017-04-01 – 2017-04-05 (×5): 1250 mg via INTRAVENOUS
  Filled 2017-04-01 (×6): qty 1250

## 2017-04-01 MED ORDER — INSULIN ASPART 100 UNIT/ML ~~LOC~~ SOLN
6.0000 [IU] | Freq: Three times a day (TID) | SUBCUTANEOUS | Status: DC
  Administered 2017-04-01 – 2017-04-02 (×3): 6 [IU] via SUBCUTANEOUS

## 2017-04-01 MED ORDER — PRO-STAT SUGAR FREE PO LIQD
30.0000 mL | Freq: Two times a day (BID) | ORAL | Status: DC
  Administered 2017-04-01 – 2017-04-08 (×12): 30 mL via ORAL
  Filled 2017-04-01 (×11): qty 30

## 2017-04-01 MED ORDER — METOPROLOL TARTRATE 12.5 MG HALF TABLET
12.5000 mg | ORAL_TABLET | Freq: Two times a day (BID) | ORAL | Status: DC
  Administered 2017-04-01 (×2): 12.5 mg via ORAL
  Filled 2017-04-01 (×3): qty 1

## 2017-04-01 MED ORDER — LIVING WELL WITH DIABETES BOOK
Freq: Once | Status: DC
  Filled 2017-04-01: qty 1

## 2017-04-01 NOTE — Progress Notes (Signed)
Initial Nutrition Assessment  DOCUMENTATION CODES:   Obesity unspecified  INTERVENTION:   -Pro-Stat 30 mL BID (provides 100 kcals, 15 g of protein per 30 mL)  -Reinforced importance of adequate protein and good glucose management with regards to wound healing. Reviewed sources of protein and encouraged protein intake at all meals. Reviewed basics of carb controlled diabetic diet, encouraged medication adherence post discharge. Pt with no questions at this time   NUTRITION DIAGNOSIS:   Increased nutrient needs related to wound healing as evidenced by estimated needs.  GOAL:   Patient will meet greater than or equal to 90% of their needs  MONITOR:   PO intake, Supplement acceptance, Labs, Weight trends, Skin  REASON FOR ASSESSMENT:   Consult Wound healing  ASSESSMENT:   46 yo male admitted with sepsis secondary to cellulitis and infection of right lef calf wound. Hyperglycemia with poorly controlled DM, pt has not taken diabetic meds in over a year; AKI on CKD III. Pt with hx of HTN, DM, gout, CKD, DVT   Pt reports appetite has been fair. Recorded po intake 83% of meals. 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 Reinforced the importance of eating something soon after waking and then eating every 4-5 hours while awake.   Pt reports 10-20 pound wt los sin the past year. Up to 7.7% wt loss in 1 year which is not significant for time frame.   Nutrition-Focused physical exam completed. Findings are WDL for fat depletion and  muscle depletion. Pt with mild edema.   Lab Results  Component Value Date   HGBA1C >15.5 (H) 03/31/2017   Labs: Creatinine 1.90, BUN wdl Meds: NS at 50 ml/hr. ss novolog, novolog with meals, lantus   Diet Order:  Diet heart healthy/carb modified Room service appropriate? Yes; Fluid consistency: Thin  Skin:  Wound (see comment) (infected right calf wound)  Last BM:  9/15  Height:   Ht Readings from Last 1  Encounters:  03/31/17 5' 11.5" (1.816 m)    Weight:   Wt Readings from Last 1 Encounters:  03/31/17 241 lb 12.8 oz (109.7 kg)    Ideal Body Weight:  79.5 kg  BMI:  Body mass index is 33.25 kg/m.  Estimated Nutritional Needs:   Kcal:  2000-2400 kcals  Protein:  110-125 g  Fluid:  >/= 2 L  EDUCATION NEEDS:   Education needs addressed  Kerman Passey MS, RD, LDN [phone] Pager  [phone] Weekend/On-Call Pager

## 2017-04-01 NOTE — Progress Notes (Signed)
Inpatient Diabetes Program Recommendations  AACE/ADA: New Consensus Statement on Inpatient Glycemic Control (2015)  Target Ranges:  Prepandial:   less than 140 mg/dL      Peak postprandial:   less than 180 mg/dL (1-2 hours)      Critically ill patients:  140 - 180 mg/dL   Results for MAC, DOWDELL (MRN [id number]) as of 04/01/2017 14:01  Ref. Range 04/01/2017 07:58 04/01/2017 12:18  Glucose-Capillary Latest Ref Range: 65 - 99 mg/dL 171 (H) 251 (H)   Results for AUSTIN, HERD (MRN [id number]) as of 04/01/2017 14:01  Ref. Range 03/31/2017 03:20  Hemoglobin A1C Latest Ref Range: 4.8 - 5.6 % >15.5 (H)    Admit with: LE Wound  History: DM  Home DM Meds: 70/30 Insulin- 38 units in AM and 32 units in PM (was instructed to restart 70/30 insulin by PCP Dr. Ginette Pitman with [facility] in [place] on 03/27/17)  Current Insulin Orders: Lantus 15 units daily      Novolog Moderate Correction Scale/ SSI (0-15 units) TID AC       Novolog 6 units TID with meals      Note Lantus insulin initiated yesterday and Novolog Meal Coverage increased today.  Current A1c of >15.5% shows extremely poor glucose control at home.  Spoke with patient today at bedside.  Patient told me he moved from [place] to [facility] and never made the time to pick up his insulin Rx or establish care with PCP in the area.  Stated he got busy with work and didn't take the time to make his health a priority.  Hasn't taken any diabetes medications since June of 2017.  Spoke with patient about his current A1c of >15.5%.  Explained what an A1c is and what it measures.  Reminded patient that his goal A1c is 7% or less per ADA standards to prevent both acute and long-term complications.  Explained to patient the extreme importance of good glucose control at home.  Encouraged patient to check his CBGs at least bid at home (fasting and another check within the day) and to record all CBGs in a logbook for his PCP to review.   Patient stated he needs a new CBG meter and requested a Rx for one.  Is familiar with giving insulin injections with vial and syringe and is open to using insulin again at time of discharge.  Have asked RNs working with pt to review insulin injections to make sure pt able to competently give himself insulin.  Have also ordered Outpatient Diabetes Education referral for pt to follow up with the Certified Diabetes Educators at the [place].  Per Review of Care Everywhere, patient established care with new PCP Dr. Ginette Pitman ([facility] in [place]) on 03/27/17.    At that visit, patient was instructed to initiate 70/30 Insulin- 38 units in AM and 32 units in PM.     MD- Please make sure to give patient a Rx for CBG meter at time of discharge (Order # [id number])  Please also ask patient if he needs Rx for 70/30 Insulin.  May not need Rx for insulin as PCP (Dr. Ginette Pitman) may have given him one on 03/27/17.     --Will follow patient during hospitalization--  Wyn Quaker RN, MSN, CDE Diabetes Coordinator Inpatient Glycemic Control Team Team Pager: [phone] (8a-5p)

## 2017-04-01 NOTE — Progress Notes (Signed)
Pharmacy Antibiotic Note  [name] is a 46 y.o. male admitted on 03/30/2017 with sepsis/cellulitis  Pharmacy has been consulted for Vancomycin / Zosyn dosing.  The patient's renal function continues to fluctuate, SCr up to 1.9, normalized CrCl~50 ml/min. Will adjust the Vancomycin dose today.   Plan: 1. Adjust Vancomycin to 1250 mg IV every 24 hours 2. Continue Zosyn 3.375g IV every 8 hours (infused over 4 hours) 3. Will continue to follow renal function, culture results, LOT, and antibiotic de-escalation plans   Height: 5' 11.5" (181.6 cm) Weight: 241 lb 12.8 oz (109.7 kg) IBW/kg (Calculated) : 76.45  Temp (24hrs), Avg:100.7 F (38.2 C), Min:98.5 F (36.9 C), Max:102.9 F (39.4 C)   Recent Labs Lab 03/30/17 1859 03/30/17 1920 03/30/17 2118 03/30/17 2317 03/31/17 0320 04/01/17 0254  WBC 13.6*  --   --   --  14.7* 15.2*  CREATININE 1.89*  --   --   --  1.43* 1.90*  LATICACIDVEN  --  2.29* 2.87* 1.66  --   --     Estimated Creatinine Clearance: 62.4 mL/min (A) (by C-G formula based on SCr of 1.9 mg/dL (H)).    No Known Allergies  Vancomycin 9/15> Zosyn 9/15>  9/15 RLE Wound cx >> 9/15 BCx >>  Thank you for allowing pharmacy to be a part of this patient's care.  Alycia Rossetti, PharmD, BCPS Clinical Pharmacist Pager: [phone] Clinical phone for 04/01/2017 from 7a-3:30p: [phone] If after 3:30p, please call main pharmacy at: [phone] 04/01/2017 1:23 PM

## 2017-04-01 NOTE — Progress Notes (Signed)
Patient was in his bed watching television. No family member present. Chaplain introduced and shared about advance directives which pt acknowledged as something to consider at a later time but not now. Chaplain offered explanations, reflective listening and compassionate presence.   Chplain Manley Fason.

## 2017-04-01 NOTE — Care Management Note (Signed)
Case Management Note  Patient Details  Name: [name] MRN: [id number] Date of Birth: [birth year]  Subjective/Objective:           CM following for progression and d/c planning.          Action/Plan: 04/01/2017 Noted CM consult for several d/c plans. Unclear at this time what pt needs will be. Noted referral for LTAC, this at this time does not seem appropriate as pt has few acute needs, has not had a ICU stay Can arrange [facility] services to assist in the home. Noted BID dressing changes , if this continues the pt will need to be able to do this or family will need to be taught by [facility] as [facility] services would not provide BID dressing changes . Will follow for PT/Ot eval and assessment of needs.   Expected Discharge Date:                  Expected Discharge Plan:  [place]  In-House Referral:  NA  Discharge planning Services  CM Consult  Post Acute Care Choice:  Home Health, Durable Medical Equipment Choice offered to:     DME Arranged:    DME Agency:     HH Arranged:    HH Agency:     Status of Service:  In process, will continue to follow  If discussed at Long Length of Stay Meetings, dates discussed:    Additional Comments:  Adron Bene, RN 04/01/2017, 12:18 PM

## 2017-04-01 NOTE — Consult Note (Signed)
Reason for Consult:Right lower leg ulceration Referring Physician: C Bijan [name] is an 46 y.o. male.  HPI: [name] was admitted with a nonhealing ulcer on his right calf. He states it's been there about a month. It started out as a small cut he thinks. This predated his calf becoming swollen and painful and him being diagnosed with a right popliteal DVT. He has been on Xarelto since then. He continues to have fevers and elevated WBC and IM requested orthopedic evaluation for possible debridement.  Past Medical History:  Diagnosis Date  . Diabetes mellitus without complication ([place])   . DVT of proximal lower limb ([place]) 03/20/2017   R calf area  . Gout   . Hypertension     Past Surgical History:  Procedure Laterality Date  . SCROTAL SURGERY  2006    Family History  Problem Relation Age of Onset  . Hypertension Mother   . Stroke Mother   . Diabetes Father   . Renal Disease Father   . Hypertension Sister   . Diabetes Sister   . Renal Disease Sister   . Iron deficiency Sister     Social History:  reports that he has never smoked. He has never used smokeless tobacco. He reports that he does not drink alcohol or use drugs.  Allergies: No Known Allergies  Medications: I have reviewed the patient's current medications.  Results for orders placed or performed during the hospital encounter of 03/30/17 (from the past 48 hour(s))  Comprehensive metabolic panel     Status: Abnormal   Collection Time: 03/30/17  6:59 PM  Result Value Ref Range   Sodium 129 (L) 135 - 145 mmol/L   Potassium 4.4 3.5 - 5.1 mmol/L   Chloride 94 (L) 101 - 111 mmol/L   CO2 21 (L) 22 - 32 mmol/L   Glucose, Bld 521 (HH) 65 - 99 mg/dL    Comment: CRITICAL RESULT CALLED TO, READ BACK BY AND VERIFIED WITH: [name] AT 2020 [id number] [name]    BUN 18 6 - 20 mg/dL   Creatinine, Ser 1.89 (H) 0.61 - 1.24 mg/dL   Calcium 8.6 (L) 8.9 - 10.3 mg/dL   Total Protein 7.9 6.5 - 8.1 g/dL   Albumin 2.4 (L)  3.5 - 5.0 g/dL   AST 25 15 - 41 U/L   ALT 23 17 - 63 U/L   Alkaline Phosphatase 107 38 - 126 U/L   Total Bilirubin 0.7 0.3 - 1.2 mg/dL   GFR calc non Af Amer 41 (L) >60 mL/min   GFR calc Af Amer 48 (L) >60 mL/min    Comment: (NOTE) The eGFR has been calculated using the CKD EPI equation. This calculation has not been validated in all clinical situations. eGFR's persistently <60 mL/min signify possible Chronic Kidney Disease.    Anion gap 14 5 - 15  CBC with Differential     Status: Abnormal   Collection Time: 03/30/17  6:59 PM  Result Value Ref Range   WBC 13.6 (H) 4.0 - 10.5 K/uL   RBC 4.73 4.22 - 5.81 MIL/uL   Hemoglobin 13.5 13.0 - 17.0 g/dL   HCT 40.8 39.0 - 52.0 %   MCV 86.3 78.0 - 100.0 fL   MCH 28.5 26.0 - 34.0 pg   MCHC 33.1 30.0 - 36.0 g/dL   RDW 12.2 11.5 - 15.5 %   Platelets 352 150 - 400 K/uL   Neutrophils Relative % 76 %   Neutro Abs 10.2 (H)  1.7 - 7.7 K/uL   Lymphocytes Relative 17 %   Lymphs Abs 2.4 0.7 - 4.0 K/uL   Monocytes Relative 7 %   Monocytes Absolute 1.0 0.1 - 1.0 K/uL   Eosinophils Relative 0 %   Eosinophils Absolute 0.1 0.0 - 0.7 K/uL   Basophils Relative 0 %   Basophils Absolute 0.0 0.0 - 0.1 K/uL  Blood culture (routine x 2)     Status: None (Preliminary result)   Collection Time: 03/30/17  7:02 PM  Result Value Ref Range   Specimen Description BLOOD LEFT FOREARM    Special Requests      BOTTLES DRAWN AEROBIC AND ANAEROBIC Blood Culture adequate volume   Culture NO GROWTH < 24 HOURS    Report Status PENDING   I-Stat CG4 Lactic Acid, ED     Status: Abnormal   Collection Time: 03/30/17  7:20 PM  Result Value Ref Range   Lactic Acid, Venous 2.29 (HH) 0.5 - 1.9 mmol/L   Comment NOTIFIED PHYSICIAN   Blood culture (routine x 2)     Status: None (Preliminary result)   Collection Time: 03/30/17  7:43 PM  Result Value Ref Range   Specimen Description BLOOD RIGHT ANTECUBITAL    Special Requests      BOTTLES DRAWN AEROBIC AND ANAEROBIC Blood  Culture adequate volume   Culture NO GROWTH < 12 HOURS    Report Status PENDING   [name]-reactive protein     Status: Abnormal   Collection Time: 03/30/17  8:39 PM  Result Value Ref Range   CRP 27.2 (H) <1.0 mg/dL  Sedimentation rate     Status: Abnormal   Collection Time: 03/30/17  8:39 PM  Result Value Ref Range   Sed Rate 108 (H) 0 - 16 mm/hr  I-Stat CG4 Lactic Acid, ED  (not at  [facility])     Status: Abnormal   Collection Time: 03/30/17  9:18 PM  Result Value Ref Range   Lactic Acid, Venous 2.87 (HH) 0.5 - 1.9 mmol/L   Comment NOTIFIED PHYSICIAN   Urinalysis, Routine w reflex microscopic     Status: Abnormal   Collection Time: 03/30/17  9:30 PM  Result Value Ref Range   Color, Urine STRAW (A) YELLOW   APPearance CLEAR CLEAR   Specific Gravity, Urine 1.016 1.005 - 1.030   pH 5.0 5.0 - 8.0   Glucose, UA >=500 (A) NEGATIVE mg/dL   Hgb urine dipstick MODERATE (A) NEGATIVE   Bilirubin Urine NEGATIVE NEGATIVE   Ketones, ur 20 (A) NEGATIVE mg/dL   Protein, ur 30 (A) NEGATIVE mg/dL   Nitrite NEGATIVE NEGATIVE   Leukocytes, UA NEGATIVE NEGATIVE   RBC / HPF 0-5 0 - 5 RBC/hpf   WBC, UA 0-5 0 - 5 WBC/hpf   Bacteria, UA RARE (A) NONE SEEN   Squamous Epithelial / LPF NONE SEEN NONE SEEN   Mucus PRESENT   I-Stat CG4 Lactic Acid, ED     Status: None   Collection Time: 03/30/17 11:17 PM  Result Value Ref Range   Lactic Acid, Venous 1.66 0.5 - 1.9 mmol/L  CBG monitoring, ED     Status: Abnormal   Collection Time: 03/30/17 11:18 PM  Result Value Ref Range   Glucose-Capillary 447 (H) 65 - 99 mg/dL  Aerobic Culture (superficial specimen)     Status: None (Preliminary result)   Collection Time: 03/30/17 11:23 PM  Result Value Ref Range   Specimen Description WOUND RIGHT LEG    Special Requests Normal  Gram Stain      RARE WBC PRESENT, PREDOMINANTLY PMN RARE GRAM POSITIVE COCCI IN PAIRS IN CLUSTERS RARE GRAM NEGATIVE RODS    Culture PENDING    Report Status PENDING   POC CBG, ED      Status: Abnormal   Collection Time: 03/31/17  3:09 AM  Result Value Ref Range   Glucose-Capillary 311 (H) 65 - 99 mg/dL  Prealbumin     Status: Abnormal   Collection Time: 03/31/17  3:20 AM  Result Value Ref Range   Prealbumin <5 (L) 18 - 38 mg/dL  CBC     Status: Abnormal   Collection Time: 03/31/17  3:20 AM  Result Value Ref Range   WBC 14.7 (H) 4.0 - 10.5 K/uL   RBC 4.19 (L) 4.22 - 5.81 MIL/uL   Hemoglobin 12.0 (L) 13.0 - 17.0 g/dL   HCT 36.4 (L) 39.0 - 52.0 %   MCV 86.9 78.0 - 100.0 fL   MCH 28.6 26.0 - 34.0 pg   MCHC 33.0 30.0 - 36.0 g/dL   RDW 12.1 11.5 - 15.5 %   Platelets 322 150 - 400 K/uL  Basic metabolic panel     Status: Abnormal   Collection Time: 03/31/17  3:20 AM  Result Value Ref Range   Sodium 133 (L) 135 - 145 mmol/L   Potassium 4.5 3.5 - 5.1 mmol/L    Comment: SPECIMEN HEMOLYZED. HEMOLYSIS MAY AFFECT INTEGRITY OF RESULTS.   Chloride 102 101 - 111 mmol/L   CO2 23 22 - 32 mmol/L   Glucose, Bld 296 (H) 65 - 99 mg/dL   BUN 16 6 - 20 mg/dL   Creatinine, Ser 1.43 (H) 0.61 - 1.24 mg/dL   Calcium 8.0 (L) 8.9 - 10.3 mg/dL   GFR calc non Af Amer 58 (L) >60 mL/min   GFR calc Af Amer >60 >60 mL/min    Comment: (NOTE) The eGFR has been calculated using the CKD EPI equation. This calculation has not been validated in all clinical situations. eGFR's persistently <60 mL/min signify possible Chronic Kidney Disease.    Anion gap 8 5 - 15  CBG monitoring, ED     Status: Abnormal   Collection Time: 03/31/17  4:28 AM  Result Value Ref Range   Glucose-Capillary 277 (H) 65 - 99 mg/dL  CBG monitoring, ED     Status: Abnormal   Collection Time: 03/31/17  7:24 AM  Result Value Ref Range   Glucose-Capillary 217 (H) 65 - 99 mg/dL  TSH     Status: None   Collection Time: 03/31/17 10:00 AM  Result Value Ref Range   TSH 1.084 0.350 - 4.500 uIU/mL    Comment: Performed by a 3rd Generation assay with a functional sensitivity of <=0.01 uIU/mL.  CBG monitoring, ED     Status:  Abnormal   Collection Time: 03/31/17 12:23 PM  Result Value Ref Range   Glucose-Capillary 167 (H) 65 - 99 mg/dL  Glucose, capillary     Status: Abnormal   Collection Time: 03/31/17  1:40 PM  Result Value Ref Range   Glucose-Capillary 180 (H) 65 - 99 mg/dL  Glucose, capillary     Status: Abnormal   Collection Time: 03/31/17  4:42 PM  Result Value Ref Range   Glucose-Capillary 235 (H) 65 - 99 mg/dL   Comment 1 Notify [name]    Comment 2 Document in Chart   Glucose, capillary     Status: Abnormal   Collection Time: 03/31/17 11:28 PM  Result  Value Ref Range   Glucose-Capillary 120 (H) 65 - 99 mg/dL  Glucose, capillary     Status: Abnormal   Collection Time: 04/01/17  2:33 AM  Result Value Ref Range   Glucose-Capillary 175 (H) 65 - 99 mg/dL  Comprehensive metabolic panel     Status: Abnormal   Collection Time: 04/01/17  2:54 AM  Result Value Ref Range   Sodium 137 135 - 145 mmol/L   Potassium 4.0 3.5 - 5.1 mmol/L   Chloride 104 101 - 111 mmol/L   CO2 25 22 - 32 mmol/L   Glucose, Bld 177 (H) 65 - 99 mg/dL   BUN 12 6 - 20 mg/dL   Creatinine, Ser 1.90 (H) 0.61 - 1.24 mg/dL   Calcium 8.2 (L) 8.9 - 10.3 mg/dL   Total Protein 6.3 (L) 6.5 - 8.1 g/dL   Albumin 1.7 (L) 3.5 - 5.0 g/dL   AST 11 (L) 15 - 41 U/L   ALT 14 (L) 17 - 63 U/L   Alkaline Phosphatase 66 38 - 126 U/L   Total Bilirubin 0.8 0.3 - 1.2 mg/dL   GFR calc non Af Amer 41 (L) >60 mL/min   GFR calc Af Amer 48 (L) >60 mL/min    Comment: (NOTE) The eGFR has been calculated using the CKD EPI equation. This calculation has not been validated in all clinical situations. eGFR's persistently <60 mL/min signify possible Chronic Kidney Disease.    Anion gap 8 5 - 15  CBC with Differential/Platelet     Status: Abnormal   Collection Time: 04/01/17  2:54 AM  Result Value Ref Range   WBC 15.2 (H) 4.0 - 10.5 K/uL   RBC 3.95 (L) 4.22 - 5.81 MIL/uL   Hemoglobin 11.3 (L) 13.0 - 17.0 g/dL   HCT 35.2 (L) 39.0 - 52.0 %   MCV 89.1 78.0 -  100.0 fL   MCH 28.6 26.0 - 34.0 pg   MCHC 32.1 30.0 - 36.0 g/dL   RDW 12.4 11.5 - 15.5 %   Platelets 320 150 - 400 K/uL   Neutrophils Relative % 74 %   Lymphocytes Relative 16 %   Monocytes Relative 9 %   Eosinophils Relative 1 %   Basophils Relative 0 %   Neutro Abs 11.2 (H) 1.7 - 7.7 K/uL   Lymphs Abs 2.4 0.7 - 4.0 K/uL   Monocytes Absolute 1.4 (H) 0.1 - 1.0 K/uL   Eosinophils Absolute 0.2 0.0 - 0.7 K/uL   Basophils Absolute 0.0 0.0 - 0.1 K/uL   Smear Review MORPHOLOGY UNREMARKABLE   Glucose, capillary     Status: Abnormal   Collection Time: 04/01/17  7:58 AM  Result Value Ref Range   Glucose-Capillary 171 (H) 65 - 99 mg/dL    Dg Chest 2 View  Result Date: 04/01/2017 CLINICAL DATA:  Fever and sepsis EXAM: CHEST  2 VIEW COMPARISON:  None. FINDINGS: Lateral imaging is degraded by motion. Normal heart size. Mild aortic tortuosity. No acute infiltrate or edema. No effusion or pneumothorax. No acute osseous findings. IMPRESSION: No evidence of active cardiopulmonary disease. Electronically Signed   By: Monte Fantasia M.D.   On: 04/01/2017 08:46   Dg Tibia/fibula Right  Result Date: 03/30/2017 CLINICAL DATA:  Laceration of the calf a few months ago. Patient is on Xarelto for blood clot and has increased bleeding of the wound in the back of the right calf. Rule out free air. EXAM: RIGHT TIBIA AND FIBULA - 2 VIEW COMPARISON:  None. FINDINGS: There  is generalized mild soft tissue induration and swelling of the right leg. There is a soft tissue laceration is seen along the lateral aspect of the leg at the junction of the middle and distal third. No soft tissue emphysema is seen. No acute osseous abnormality is noted. Distal intraosseous membrane ossifications are seen between the tibia and fibula. IMPRESSION: Generalized soft tissue swelling of the right leg with soft tissue laceration at the junction of the middle and distal third laterally. No soft tissue emphysema or suspicious osseous  abnormality. Electronically Signed   By: Ashley Royalty M.D.   On: 03/30/2017 22:38    Review of Systems  Constitutional: Negative for weight loss.  HENT: Negative for ear discharge, ear pain, hearing loss and tinnitus.   Eyes: Negative for blurred vision, double vision, photophobia and pain.  Respiratory: Negative for cough, sputum production and shortness of breath.   Cardiovascular: Negative for chest pain.  Gastrointestinal: Negative for abdominal pain, nausea and vomiting.  Genitourinary: Negative for dysuria, flank pain, frequency and urgency.  Musculoskeletal: Positive for joint pain (Right lower leg). Negative for back pain, falls, myalgias and neck pain.  Neurological: Negative for dizziness, tingling, sensory change, focal weakness, loss of consciousness and headaches.  Endo/Heme/Allergies: Does not bruise/bleed easily.  Psychiatric/Behavioral: Negative for depression, memory loss and substance abuse. The patient is not nervous/anxious.    Blood pressure (!) 156/90, pulse 95, temperature 99.4 F (37.4 [name]), temperature source Oral, resp. rate 18, height 5' 11.5" (1.816 m), weight 109.7 kg (241 lb 12.8 oz), SpO2 100 %. Physical Exam  Constitutional: He appears well-developed and well-nourished. No distress.  HENT:  Head: Normocephalic.  Eyes: Conjunctivae are normal. Right eye exhibits no discharge. Left eye exhibits no discharge. No scleral icterus.  Cardiovascular: Normal rate and regular rhythm.   Respiratory: Effort normal. No respiratory distress.  Musculoskeletal:  RLE No traumatic wounds, ecchymosis, or rash. Large ulceration lateral calf with epidermal sloughing, central areas of necrosis, weeping, no odor or purulence. Calf circumference > left.  Mild to mod TTP lower leg  No knee effusion  Knee stable to varus/ valgus and anterior/posterior stress  Sens DPN, SPN, TN intact  Motor EHL, ext, flex, evers 5/5  DP 2+, PT 1+, 2+ NP edema  LLE No traumatic wounds, ecchymosis,  or rash  Nontender  No knee or ankle effusion  Knee stable to varus/ valgus and anterior/posterior stress  Sens DPN, SPN, TN intact  Motor EHL, ext, flex, evers 5/5  DP 2+, PT 2+, No significant edema  Neurological: He is alert.  Skin: Skin is warm and dry. He is not diaphoretic.  Psychiatric: He has a normal mood and affect. His behavior is normal.    Assessment/Plan: Right calf wound -- Suspect venous stasis ulcer given DVT and leg swelling. Dr. Sharol Given to evaluate. Hold Xarelto for now. Will use Aquacel AG for wound care and place TED hose to help control fluid.    Lisette Abu, PA-[name] [phone] 04/01/2017, 10:32 AM

## 2017-04-01 NOTE — Consult Note (Addendum)
WOC consult requested for right leg wound.  Ortho service PA is in the room and plans to follow for assessment and plan of care.  Please refer to their team for further questions. Please re-consult if further assistance is needed.  Thank-you,  Julien Girt MSN, [place], [place], [place], [place]

## 2017-04-01 NOTE — Progress Notes (Signed)
PROGRESS NOTE  [name]  FYB:[id number]  DOB: 01-23-1971  DOA: 03/30/2017 PCP: Patient, No Pcp Per   Brief Admission Hx: 46 y/o male with poorly controlled insulin requiring diabetes, HTN, gout, CKD, recently diagnosed DVT RLE on xarelto who presented to ED with fever and poorly healing right calf leg wound.    MDM/Assessment & Plan:   1. Sepsis - secondary to cellulitis and infection of right leg calf wound - continue IV antibiotics, wound care, supportive care.  lactic acid trended down. WBC trending up.  Continue antibiotics.  Follow vitals closely and monitor telemetry.  Ortho consult for consideration of debridement of wound.   2. Right leg wound - I have requested a wound care nurse consultation.  Given that he remains febrile and WBC climbing, will request orthopedics consult for consideration of wound debridement.  He will likely need to follow up with outpatient wound care center for further management as well once discharged.   3. Type 2 diabetes mellitus, insulin requiring with neurological complications - Pt reports that he has not had insulin to take in over 1 year.  He recently established with a PCP and is willing to take insulin again.  Basal bolus insulin ordered.  Checking A1c.  TSH normal.  Monitor BS closely.  Diabetes education ordered in hospital.   4. Acute Kidney Injury on CKD (suspected) - suspect he has CKD from uncontrolled hypertension and diabetes mellitus.  IV fluids ordered for hydration, avoid nephrotoxins, follow renal function panel.  5. Essential hypertension - poorly controlled, started amlodipine, add metoprolol, follow  6. DVT of RLE - He is on xarelto and saw hematology recently and they are suggesting he be on anticoagulation indefinitely given his risk factors for DVT.  He will follow up in 6 months with hematology to discuss further.   7. Malnutrition of chronic disease - requesting dietitian consult.    DVT prophylaxis: Xarelto Code Status:  Full  Family Communication: bedside Disposition Plan: telemetry  Consultants:  Wound care nurse  Subjective: Pt still having fever.   Objective: Vitals:   03/31/17 1700 03/31/17 2331 04/01/17 0239 04/01/17 0634  BP: 135/66 (!) 164/92 (!) 147/88 (!) 162/97  Pulse: 77 (!) 102 94 (!) 101  Resp:  20  18  Temp: 98.5 F (36.9 C) (!) 102.9 F (39.4 C) (!) 101 F (38.3 C) (!) 101.6 F (38.7 C)  TempSrc: Oral Oral Oral Oral  SpO2: 98% 96% 96% 100%  Weight:  109.7 kg (241 lb 12.8 oz)    Height:        Intake/Output Summary (Last 24 hours) at 04/01/17 0708 Last data filed at 04/01/17 5277  Gross per 24 hour  Intake             5210 ml  Output             2275 ml  Net             2935 ml   Filed Weights   03/30/17 2100 03/31/17 1324 03/31/17 2331  Weight: 105.9 kg (233 lb 8 oz) 108 kg (238 lb 1.6 oz) 109.7 kg (241 lb 12.8 oz)     REVIEW OF SYSTEMS  As per history otherwise all reviewed and reported negative  Exam:  General exam: awake, alert, NAD. Cooperative.  Respiratory system: Clear. No increased work of breathing. Cardiovascular system: S1 & S2 heard, tachycardic. No JVD, murmurs, gallops, clicks or pedal edema. Gastrointestinal system: Abdomen is nondistended, soft and nontender. Normal  bowel sounds heard. Central nervous system: Alert and oriented. No focal neurological deficits. Extremities: right leg calf wound: unchanged     Data Reviewed: Basic Metabolic Panel:  Recent Labs Lab 03/30/17 1859 03/31/17 0320 04/01/17 0254  NA 129* 133* 137  K 4.4 4.5 4.0  CL 94* 102 104  CO2 21* 23 25  GLUCOSE 521* 296* 177*  BUN 18 16 12   CREATININE 1.89* 1.43* 1.90*  CALCIUM 8.6* 8.0* 8.2*   Liver Function Tests:  Recent Labs Lab 03/30/17 1859 04/01/17 0254  AST 25 11*  ALT 23 14*  ALKPHOS 107 66  BILITOT 0.7 0.8  PROT 7.9 6.3*  ALBUMIN 2.4* 1.7*   No results for input(s): LIPASE, AMYLASE in the last 168 hours. No results for input(s): AMMONIA in  the last 168 hours. CBC:  Recent Labs Lab 03/30/17 1859 03/31/17 0320 04/01/17 0254  WBC 13.6* 14.7* 15.2*  NEUTROABS 10.2*  --  11.2*  HGB 13.5 12.0* 11.3*  HCT 40.8 36.4* 35.2*  MCV 86.3 86.9 89.1  PLT 352 322 320   Cardiac Enzymes: No results for input(s): CKTOTAL, CKMB, CKMBINDEX, TROPONINI in the last 168 hours. CBG (last 3)   Recent Labs  03/31/17 1642 03/31/17 2328 04/01/17 0233  GLUCAP 235* 120* 175*   Recent Results (from the past 240 hour(s))  Blood culture (routine x 2)     Status: None (Preliminary result)   Collection Time: 03/30/17  7:02 PM  Result Value Ref Range Status   Specimen Description BLOOD LEFT FOREARM  Final   Special Requests   Final    BOTTLES DRAWN AEROBIC AND ANAEROBIC Blood Culture adequate volume   Culture NO GROWTH < 24 HOURS  Final   Report Status PENDING  Incomplete  Blood culture (routine x 2)     Status: None (Preliminary result)   Collection Time: 03/30/17  7:43 PM  Result Value Ref Range Status   Specimen Description BLOOD RIGHT ANTECUBITAL  Final   Special Requests   Final    BOTTLES DRAWN AEROBIC AND ANAEROBIC Blood Culture adequate volume   Culture NO GROWTH < 12 HOURS  Final   Report Status PENDING  Incomplete  Aerobic Culture (superficial specimen)     Status: None (Preliminary result)   Collection Time: 03/30/17 11:23 PM  Result Value Ref Range Status   Specimen Description WOUND RIGHT LEG  Final   Special Requests Normal  Final   Gram Stain   Final    RARE WBC PRESENT, PREDOMINANTLY PMN RARE GRAM POSITIVE COCCI IN PAIRS IN CLUSTERS RARE GRAM NEGATIVE RODS    Culture PENDING  Incomplete   Report Status PENDING  Incomplete     Studies: Dg Tibia/fibula Right  Result Date: 03/30/2017 CLINICAL DATA:  Laceration of the calf a few months ago. Patient is on Xarelto for blood clot and has increased bleeding of the wound in the back of the right calf. Rule out free air. EXAM: RIGHT TIBIA AND FIBULA - 2 VIEW COMPARISON:   None. FINDINGS: There is generalized mild soft tissue induration and swelling of the right leg. There is a soft tissue laceration is seen along the lateral aspect of the leg at the junction of the middle and distal third. No soft tissue emphysema is seen. No acute osseous abnormality is noted. Distal intraosseous membrane ossifications are seen between the tibia and fibula. IMPRESSION: Generalized soft tissue swelling of the right leg with soft tissue laceration at the junction of the middle and distal third laterally.  No soft tissue emphysema or suspicious osseous abnormality. Electronically Signed   By: Ashley Royalty M.D.   On: 03/30/2017 22:38   Scheduled Meds: . amLODipine  5 mg Oral Daily  . insulin aspart  0-15 Units Subcutaneous TID WC  . insulin aspart  5 Units Subcutaneous TID WC  . insulin glargine  15 Units Subcutaneous Daily  . metoprolol tartrate  12.5 mg Oral BID  . mupirocin cream   Topical BID  . polyethylene glycol  17 g Oral Daily  . Rivaroxaban  15 mg Oral BID WC  . [START ON 04/11/2017] rivaroxaban  20 mg Oral Q supper   Continuous Infusions: . sodium chloride 150 mL/hr at 03/31/17 2227  . piperacillin-tazobactam (ZOSYN)  IV 3.375 g (04/01/17 0600)  . vancomycin Stopped (04/01/17 0017)    Principal Problem:   Sepsis ([place]) Active Problems:   Hyperglycemia due to type 2 diabetes mellitus ([place])   Acute kidney injury superimposed on chronic kidney disease (HCC)   Right leg DVT (HCC)   Diabetic ulcer of calf ([place])   Noncompliance w/medication treatment due to intermit use of medication   Cellulitis of right lower leg   HTN (hypertension)   Hypoalbuminemia   Sinus tachycardia   Fever   Chronic anticoagulation  Time spent:   Irwin Brakeman, MD, FAAFP [facility] Pager [phone]  If 7PM-7AM, please contact night-coverage [email or web address] Password [email or web address] 04/01/2017, 7:08 AM    LOS: 2 days

## 2017-04-02 DIAGNOSIS — L03115 Cellulitis of right lower limb: Secondary | ICD-10-CM

## 2017-04-02 LAB — COMPREHENSIVE METABOLIC PANEL
ALT: 13 U/L — ABNORMAL LOW (ref 17–63)
AST: 11 U/L — ABNORMAL LOW (ref 15–41)
Albumin: 1.7 g/dL — ABNORMAL LOW (ref 3.5–5.0)
Alkaline Phosphatase: 72 U/L (ref 38–126)
Anion gap: 8 (ref 5–15)
BUN: 14 mg/dL (ref 6–20)
CO2: 25 mmol/L (ref 22–32)
Calcium: 8.3 mg/dL — ABNORMAL LOW (ref 8.9–10.3)
Chloride: 104 mmol/L (ref 101–111)
Creatinine, Ser: 2.12 mg/dL — ABNORMAL HIGH (ref 0.61–1.24)
GFR calc Af Amer: 42 mL/min — ABNORMAL LOW (ref >60)
GFR calc non Af Amer: 36 mL/min — ABNORMAL LOW (ref >60)
Glucose, Bld: 195 mg/dL — ABNORMAL HIGH (ref 65–99)
Potassium: 3.7 mmol/L (ref 3.5–5.1)
Sodium: 137 mmol/L (ref 135–145)
Total Bilirubin: 0.8 mg/dL (ref 0.3–1.2)
Total Protein: 6.7 g/dL (ref 6.5–8.1)

## 2017-04-02 LAB — AEROBIC CULTURE W GRAM STAIN (SUPERFICIAL SPECIMEN): Special Requests: NORMAL

## 2017-04-02 LAB — CBC WITH DIFFERENTIAL/PLATELET
Basophils Absolute: 0 10*3/uL (ref 0.0–0.1)
Basophils Relative: 0 %
Eosinophils Absolute: 0.1 10*3/uL (ref 0.0–0.7)
Eosinophils Relative: 1 %
HCT: 37.2 % — ABNORMAL LOW (ref 39.0–52.0)
Hemoglobin: 12 g/dL — ABNORMAL LOW (ref 13.0–17.0)
Lymphocytes Relative: 12 %
Lymphs Abs: 1.9 10*3/uL (ref 0.7–4.0)
MCH: 28.6 pg (ref 26.0–34.0)
MCHC: 32.3 g/dL (ref 30.0–36.0)
MCV: 88.8 fL (ref 78.0–100.0)
Monocytes Absolute: 1.5 10*3/uL — ABNORMAL HIGH (ref 0.1–1.0)
Monocytes Relative: 10 %
Neutro Abs: 12.2 10*3/uL — ABNORMAL HIGH (ref 1.7–7.7)
Neutrophils Relative %: 77 %
Platelets: 357 10*3/uL (ref 150–400)
RBC: 4.19 MIL/uL — ABNORMAL LOW (ref 4.22–5.81)
RDW: 12.3 % (ref 11.5–15.5)
WBC: 15.8 10*3/uL — ABNORMAL HIGH (ref 4.0–10.5)

## 2017-04-02 LAB — HIV ANTIBODY (ROUTINE TESTING W REFLEX): HIV Screen 4th Generation wRfx: NONREACTIVE

## 2017-04-02 LAB — GLUCOSE, CAPILLARY
Glucose-Capillary: 196 mg/dL — ABNORMAL HIGH (ref 65–99)
Glucose-Capillary: 244 mg/dL — ABNORMAL HIGH (ref 65–99)
Glucose-Capillary: 170 mg/dL — ABNORMAL HIGH (ref 65–99)
Glucose-Capillary: 163 mg/dL — ABNORMAL HIGH (ref 65–99)

## 2017-04-02 MED ORDER — INSULIN ASPART 100 UNIT/ML ~~LOC~~ SOLN
8.0000 [IU] | Freq: Three times a day (TID) | SUBCUTANEOUS | Status: DC
  Administered 2017-04-02 – 2017-04-04 (×4): 8 [IU] via SUBCUTANEOUS

## 2017-04-02 MED ORDER — INSULIN GLARGINE 100 UNIT/ML ~~LOC~~ SOLN
25.0000 [IU] | Freq: Every day | SUBCUTANEOUS | Status: DC
  Administered 2017-04-04: 25 [IU] via SUBCUTANEOUS
  Filled 2017-04-02 (×3): qty 0.25

## 2017-04-02 MED ORDER — METOPROLOL TARTRATE 25 MG PO TABS
25.0000 mg | ORAL_TABLET | Freq: Two times a day (BID) | ORAL | Status: DC
  Administered 2017-04-02 – 2017-04-03 (×4): 25 mg via ORAL
  Filled 2017-04-02 (×3): qty 1

## 2017-04-02 MED ORDER — HYDRALAZINE HCL 20 MG/ML IJ SOLN: 10.0000 mg | INTRAMUSCULAR | Status: DC | PRN

## 2017-04-02 NOTE — Progress Notes (Addendum)
PROGRESS NOTE  [name]  IPJ:[id number]  DOB: 09-04-1970  DOA: 03/30/2017 PCP: Tracie Harrier, MD   Brief Admission Hx: 46 y/o male with poorly controlled insulin requiring diabetes, HTN, gout, CKD, recently diagnosed DVT RLE on xarelto who presented to ED with fever and poorly healing right calf leg wound.    MDM/Assessment & Plan:   1. Sepsis - secondary to cellulitis and infection of right leg calf wound - continue IV antibiotics, wound care, supportive care.  lactic acid trended down. WBC trending up.  Continue antibiotics.  Follow vitals closely and monitor telemetry.  Ortho consulted for debridement of wound and skin graft.   2. Right leg wound -  orthopedics planning wound debridement and subsequent skin graft.     3. Type 2 diabetes mellitus, insulin requiring with neurological complications - Pt reports that he has not had insulin to take in over 1 year.  He recently established with a PCP and is willing to take insulin again.  Basal bolus insulin ordered.  Checking A1c.  TSH normal.  Monitor BS closely.  Diabetes education ordered in hospital.  Discharge home on his regular 70/30 regimen.  4. Acute Kidney Injury on CKD (suspected) - suspect he has CKD from uncontrolled hypertension and diabetes mellitus.  IV fluids ordered for hydration, avoid nephrotoxins, follow renal function panel.  5. Essential hypertension - poorly controlled, started amlodipine, added metoprolol, follow  6. DVT of RLE - (acute) He is on xarelto and saw hematology recently and they are suggesting he be on anticoagulation indefinitely given his risk factors for DVT.  He will follow up in 6 months with hematology to discuss further.   7. Malnutrition of chronic disease - requesting dietitian consult.    DVT prophylaxis: Xarelto (temp hold for surgery), ambulation with PT Code Status: Full  Family Communication: bedside Disposition Plan: telemetry  Consultants:  Wound care nurse  Subjective: Pt  still having fever.   Objective: Vitals:   04/01/17 0944 04/01/17 2138 04/02/17 0224 04/02/17 0425  BP: (!) 156/90 (!) 165/95 (!) 155/95 (!) 168/94  Pulse: 95 (!) 109 88 94  Resp: 18 18 20 19   Temp: 99.4 F (37.4 C) (!) 102.6 F (39.2 C) 98.7 F (37.1 C) 99.1 F (37.3 C)  TempSrc: Oral Oral Oral Oral  SpO2: 100% 96% 97% 98%  Weight:  110.5 kg (243 lb 8 oz)    Height:        Intake/Output Summary (Last 24 hours) at 04/02/17 0831 Last data filed at 04/02/17 0600  Gross per 24 hour  Intake          2076.67 ml  Output             3075 ml  Net          -998.33 ml   Filed Weights   03/31/17 1324 03/31/17 2331 04/01/17 2138  Weight: 108 kg (238 lb 1.6 oz) 109.7 kg (241 lb 12.8 oz) 110.5 kg (243 lb 8 oz)     REVIEW OF SYSTEMS  As per history otherwise all reviewed and reported negative  Exam:  General exam: awake, alert, NAD. Cooperative.  Respiratory system: Clear. No increased work of breathing. Cardiovascular system: S1 & S2 heard, tachycardic. No JVD, murmurs, gallops, clicks or pedal edema. Gastrointestinal system: Abdomen is nondistended, soft and nontender. Normal bowel sounds heard. Central nervous system: Alert and oriented. No focal neurological deficits. Extremities: right leg calf wound: unchanged     Data Reviewed: Basic Metabolic Panel:  Recent Labs Lab 03/30/17 1859 03/31/17 0320 04/01/17 0254  NA 129* 133* 137  K 4.4 4.5 4.0  CL 94* 102 104  CO2 21* 23 25  GLUCOSE 521* 296* 177*  BUN 18 16 12   CREATININE 1.89* 1.43* 1.90*  CALCIUM 8.6* 8.0* 8.2*   Liver Function Tests:  Recent Labs Lab 03/30/17 1859 04/01/17 0254  AST 25 11*  ALT 23 14*  ALKPHOS 107 66  BILITOT 0.7 0.8  PROT 7.9 6.3*  ALBUMIN 2.4* 1.7*   No results for input(s): LIPASE, AMYLASE in the last 168 hours. No results for input(s): AMMONIA in the last 168 hours. CBC:  Recent Labs Lab 03/30/17 1859 03/31/17 0320 04/01/17 0254 04/02/17 0713  WBC 13.6* 14.7* 15.2*  15.8*  NEUTROABS 10.2*  --  11.2* 12.2*  HGB 13.5 12.0* 11.3* 12.0*  HCT 40.8 36.4* 35.2* 37.2*  MCV 86.3 86.9 89.1 88.8  PLT 352 322 320 357   Cardiac Enzymes: No results for input(s): CKTOTAL, CKMB, CKMBINDEX, TROPONINI in the last 168 hours. CBG (last 3)   Recent Labs  04/01/17 1218 04/01/17 1714 04/02/17 0814  GLUCAP 251* 167* 196*   Recent Results (from the past 240 hour(s))  Blood culture (routine x 2)     Status: None (Preliminary result)   Collection Time: 03/30/17  7:02 PM  Result Value Ref Range Status   Specimen Description BLOOD LEFT FOREARM  Final   Special Requests   Final    BOTTLES DRAWN AEROBIC AND ANAEROBIC Blood Culture adequate volume   Culture NO GROWTH 2 DAYS  Final   Report Status PENDING  Incomplete  Blood culture (routine x 2)     Status: None (Preliminary result)   Collection Time: 03/30/17  7:43 PM  Result Value Ref Range Status   Specimen Description BLOOD RIGHT ANTECUBITAL  Final   Special Requests   Final    BOTTLES DRAWN AEROBIC AND ANAEROBIC Blood Culture adequate volume   Culture NO GROWTH 2 DAYS  Final   Report Status PENDING  Incomplete  Aerobic Culture (superficial specimen)     Status: None (Preliminary result)   Collection Time: 03/30/17 11:23 PM  Result Value Ref Range Status   Specimen Description WOUND RIGHT LEG  Final   Special Requests Normal  Final   Gram Stain   Final    RARE WBC PRESENT, PREDOMINANTLY PMN RARE GRAM POSITIVE COCCI IN PAIRS IN CLUSTERS RARE GRAM NEGATIVE RODS    Culture   Final    ABUNDANT ESCHERICHIA COLI SUSCEPTIBILITIES TO FOLLOW    Report Status PENDING  Incomplete     Studies: Dg Chest 2 View  Result Date: 04/01/2017 CLINICAL DATA:  Fever and sepsis EXAM: CHEST  2 VIEW COMPARISON:  None. FINDINGS: Lateral imaging is degraded by motion. Normal heart size. Mild aortic tortuosity. No acute infiltrate or edema. No effusion or pneumothorax. No acute osseous findings. IMPRESSION: No evidence of active  cardiopulmonary disease. Electronically Signed   By: Monte Fantasia M.D.   On: 04/01/2017 08:46   Scheduled Meds: . amLODipine  5 mg Oral Daily  . feeding supplement (PRO-STAT SUGAR FREE 64)  30 mL Oral BID  . insulin aspart  0-15 Units Subcutaneous TID WC  . insulin aspart  6 Units Subcutaneous TID WC  . insulin glargine  15 Units Subcutaneous Daily  . living well with diabetes book   Does not apply Once  . metoprolol tartrate  12.5 mg Oral BID  . polyethylene glycol  17 g  Oral Daily   Continuous Infusions: . sodium chloride 50 mL/hr at 04/01/17 0710  . piperacillin-tazobactam (ZOSYN)  IV 3.375 g (04/02/17 0542)  . vancomycin Stopped (04/01/17 2252)    Principal Problem:   Sepsis ([place]) Active Problems:   Hyperglycemia due to type 2 diabetes mellitus ([place])   Acute kidney injury superimposed on chronic kidney disease ([place])   Right leg DVT (HCC)   Diabetic ulcer of calf ([place])   Noncompliance w/medication treatment due to intermit use of medication   Cellulitis of right lower leg   HTN (hypertension)   Hypoalbuminemia   Sinus tachycardia   Fever   Chronic anticoagulation  Time spent:   Irwin Brakeman, MD, FAAFP [facility] Pager [phone]  If 7PM-7AM, please contact night-coverage [email or web address] Password [email or web address] 04/02/2017, 8:31 AM    LOS: 3 days

## 2017-04-02 NOTE — Evaluation (Signed)
Occupational Therapy Evaluation Patient Details Name: [name] MRN: [id number] DOB: Jun 06, 1971 Today's Date: 04/02/2017    History of Present Illness [name] is a 46 y.o. male who presents with diabetic insensate neuropathy history of hypertension who is currently on Xarelto for a DVT. Patient states that he noticed a blunt trauma to the lateral aspect of his right calf about 3 months ago. Patient states she's been doing wound care. Patient states returned from a driving trip in [place] 2 weeks ago and had acute swelling of the right leg. Patient did go to emergency room and patient's Dopplers were positive for DVT in the right lower extremity popliteal fossa and patient was started on Xarelto. Patient states that since that time he's had foul-smelling drainage peeling necrotic skin and black eschar to the lateral aspect of the right calf. Plan for wound vac placement.   Clinical Impression   Pt admitted with above. He demonstrates the below listed deficits and will benefit from continued OT to maximize safety and independence with BADLs.  Pt limited significantly by 9/10 pain Rt LE.  He requires max - total assist for LB ADLs, and unable to tolerate functional transfers this session.  He lives with 41 y.o. Mother who has limited ability to assist him at discharge.  Currently recommend SNF level rehab to allow him to return home at min A - mod I level.  Will follow acutely.       Follow Up Recommendations  SNF;Supervision/Assistance - 24 hour    Equipment Recommendations  3 in 1 bedside commode    Recommendations for Other Services       Precautions / Restrictions Precautions Precautions: Fall Restrictions Weight Bearing Restrictions: No      Mobility Bed Mobility               General bed mobility comments: Pt sitting up in chair   Transfers                 General transfer comment: Pt unable to tolerate due to pain     Balance Overall balance  assessment: Needs assistance Sitting-balance support: Single extremity supported Sitting balance-Leahy Scale: Fair                                     ADL either performed or assessed with clinical judgement   ADL Overall ADL's : Needs assistance/impaired Eating/Feeding: Independent;Sitting   Grooming: Wash/dry hands;Wash/dry face;Oral care;Brushing hair;Set up;Sitting   Upper Body Bathing: Set up;Sitting   Lower Body Bathing: Moderate assistance;Sitting/lateral leans   Upper Body Dressing : Minimal assistance;Sitting   Lower Body Dressing: Maximal assistance;Sitting/lateral leans   Toilet Transfer: Total assistance Toilet Transfer Details (indicate cue type and reason): Pt unable to tolerate due to pain  Toileting- Clothing Manipulation and Hygiene: Total assistance;Sit to/from stand       Functional mobility during ADLs: Minimal assistance;+2 for physical assistance;Rolling walker General ADL Comments: Pt limited by pain      Vision         Perception     Praxis      Pertinent Vitals/Pain Pain Assessment: 0-10 Pain Score: 9  Pain Location: R distal LE Pain Descriptors / Indicators: Moaning;Guarding;Aching Pain Intervention(s): Limited activity within patient's tolerance;Premedicated before session     Hand Dominance Right   Extremity/Trunk Assessment Upper Extremity Assessment Upper Extremity Assessment: Overall WFL for tasks assessed   Lower  Extremity Assessment Lower Extremity Assessment: Defer to PT evaluation   Cervical / Trunk Assessment Cervical / Trunk Assessment: Normal   Communication Communication Communication: No difficulties   Cognition Arousal/Alertness: Awake/alert Behavior During Therapy: WFL for tasks assessed/performed;Anxious Overall Cognitive Status: Within Functional Limits for tasks assessed                                     General Comments  Discussed recommendation of SNF level rehab with pt.  He is agreeable, but prefers to discharge home if he progresses well enough     Exercises     Shoulder Instructions      Home Living Family/patient expects to be discharged to:: Skilled nursing facility Living Arrangements: Parent Available Help at Discharge: Family;Available 24 hours/day Type of Home: House Home Access: Level entry     Home Layout: One level               Home Equipment: Walker - 2 wheels;Cane - single point   Additional Comments: pt had begun using cane and then RW to keep wt off RLE prior to admission      Prior Functioning/Environment Level of Independence: Independent        Comments: pt works in a factory making socks 8 hrs/ day        OT Problem List:        OT Treatment/Interventions: Self-care/ADL training;DME and/or AE instruction;Therapeutic activities;Patient/family education;Balance training    OT Goals(Current goals can be found in the care plan section) Acute Rehab OT Goals Patient Stated Goal: to feel better  OT Goal Formulation: With patient Time For Goal Achievement: 04/16/17 Potential to Achieve Goals: Good  OT Frequency: Min 2X/week   Barriers to D/C: Decreased caregiver support  lives wtih 11 y.o. mother who is limited in her ability to assist        Co-evaluation              AM-PAC PT "6 Clicks" Daily Activity     Outcome Measure Help from another person eating meals?: None Help from another person taking care of personal grooming?: A Little Help from another person toileting, which includes using toliet, bedpan, or urinal?: Total Help from another person bathing (including washing, rinsing, drying)?: A Lot Help from another person to put on and taking off regular upper body clothing?: A Little Help from another person to put on and taking off regular lower body clothing?: A Lot 6 Click Score: 15   End of Session Nurse Communication: Mobility status  Activity Tolerance: Patient limited by pain Patient  left: in chair;with call bell/phone within reach  OT Visit Diagnosis: Pain Pain - Right/Left: Right Pain - part of body: Leg                Time: 1315-1340 OT Time Calculation (min): 25 min Charges:  OT General Charges $OT Visit: 1 Visit OT Evaluation $OT Eval Moderate Complexity: 1 Mod G-Codes:     [facility], OTR/L [phone]   Lucille Passy M 04/02/2017, 1:58 PM

## 2017-04-02 NOTE — Consult Note (Signed)
ORTHOPAEDIC CONSULTATION  REQUESTING PHYSICIAN: Murlean Iba, MD  Chief Complaint: Painful draining ulcer right lateral calf.  HPI: [name] is a 46 y.o. male who presents with diabetic insensate neuropathy history of hypertension who is currently on Xarelto for a DVT. Patient states that he noticed a blunt trauma to the lateral aspect of his right calf about 3 months ago. Patient states she's been doing wound care. Patient states returned from a driving trip in [place] 2 weeks ago and had acute swelling of the right leg. Patient did go to emergency room and patient's Dopplers were positive for DVT in the right lower extremity popliteal fossa and patient was started on Xarelto. Patient states that since that time he's had foul-smelling drainage peeling necrotic skin and black eschar to the lateral aspect of the right calf.  Past Medical History:  Diagnosis Date  . Diabetes mellitus without complication ([place])   . DVT of proximal lower limb ([place]) 03/20/2017   R calf area  . Gout   . Hypertension    Past Surgical History:  Procedure Laterality Date  . SCROTAL SURGERY  2006   Social History   Social History  . Marital status: Single    Spouse name: N/A  . Number of children: N/A  . Years of education: N/A   Social History Main Topics  . Smoking status: Never Smoker  . Smokeless tobacco: Never Used  . Alcohol use No  . Drug use: No  . Sexual activity: No   Other Topics Concern  . None   Social History Narrative  . None   Family History  Problem Relation Age of Onset  . Hypertension Mother   . Stroke Mother   . Diabetes Father   . Renal Disease Father   . Hypertension Sister   . Diabetes Sister   . Renal Disease Sister   . Iron deficiency Sister    - negative except otherwise stated in the family history section No Known Allergies Prior to Admission medications   Medication Sig Start Date End Date Taking? Authorizing Provider  cephALEXin  (KEFLEX) 500 MG capsule Take 500 mg by mouth 3 (three) times daily. 14 day course started 03/27/17 pm 03/27/17  Yes [provider]  ibuprofen (ADVIL,MOTRIN) 200 MG tablet Take 400 mg by mouth 2 (two) times daily as needed (pain).   Yes [provider]  lisinopril (PRINIVIL,ZESTRIL) 10 MG tablet Take 10 mg by mouth daily. 03/22/17  Yes [provider]  Rivaroxaban 15 & 20 MG TBPK Take as directed on package: Start with one 15mg  tablet by mouth twice a day with food. On Day 22, switch to one 20mg  tablet once a day with food. Patient taking differently: Take 15-20 mg by mouth See admin instructions. Take as directed on package - start date 03/20/17: Start with one 15mg  tablet by mouth twice a day with food (for 21 days);  On Day 22, switch to one 20mg  tablet once a day with food. 03/20/17  Yes Loney Hering, MD  indomethacin (INDOCIN) 25 MG capsule Take 1 capsule (25 mg total) by mouth 2 (two) times daily with a meal. Patient not taking: Reported on 03/22/2017 02/29/16   Joretta Bachelor, PA   Dg Chest 2 View  Result Date: 04/01/2017 CLINICAL DATA:  Fever and sepsis EXAM: CHEST  2 VIEW COMPARISON:  None. FINDINGS: Lateral imaging is degraded by motion. Normal heart size. Mild aortic tortuosity. No acute infiltrate or edema. No effusion  or pneumothorax. No acute osseous findings. IMPRESSION: No evidence of active cardiopulmonary disease. Electronically Signed   By: Monte Fantasia M.D.   On: 04/01/2017 08:46   - pertinent xrays, CT, MRI studies were reviewed and independently interpreted  Positive ROS: All other systems have been reviewed and were otherwise negative with the exception of those mentioned in the HPI and as above.  Physical Exam: General: Alert, no acute distress Psychiatric: Patient is competent for consent with normal mood and affect Lymphatic: No axillary or cervical lymphadenopathy Cardiovascular: No pedal edema Respiratory: No cyanosis, no use of  accessory musculature GI: No organomegaly, abdomen is soft and non-tender  Skin: Examination patient has necrotic black eschar over the lateral aspect of the right leg. This is approximately 10 cm in diameter. Patient has blistered peeling skin surrounding this area there is foul smelling drainage.   Neurologic: Patient does not have protective sensation bilateral lower extremities. Labs: Albumin 1.7 white cell count 15.2  MUSCULOSKELETAL:  Patient has a palpable dorsalis pedis pulse. He has no chronic venous stasis changes in either leg. Patient's right lower extremity is extremely tender to palpation. There is no crepitation surrounding the ulcer.  Assessment: Assessment: Diabetic insensate neuropathy with necrotic wound lateral aspect right calf possible necrotizing fasciitis. Protein caloric malnutrition  Plan: Plan: We will plan for surgical intervention this week for debridement of the wound placement of an instillation wound VAC followed up with repeat surgery for placement of skin graft.  Thank you for the consult and the opportunity to see [name], [place] [phone] 6:45 AM

## 2017-04-02 NOTE — Progress Notes (Signed)
Inpatient Diabetes Program Recommendations  AACE/ADA: New Consensus Statement on Inpatient Glycemic Control (2015)  Target Ranges:  Prepandial:   less than 140 mg/dL      Peak postprandial:   less than 180 mg/dL (1-2 hours)      Critically ill patients:  140 - 180 mg/dL   Results for [name], [name] (MRN [id number]) as of 04/02/2017 14:15  Ref. Range 04/01/2017 07:58 04/01/2017 12:18 04/01/2017 17:14  Glucose-Capillary Latest Ref Range: 65 - 99 mg/dL 171 (H) 251 (H) 167 (H)   Results for [name], [name] (MRN [id number]) as of 04/02/2017 14:15  Ref. Range 04/02/2017 08:14 04/02/2017 11:59  Glucose-Capillary Latest Ref Range: 65 - 99 mg/dL 196 (H) 244 (H)    Admit with: LE Wound  History: DM  Home DM Meds: 70/30 Insulin- 38 units in AM and 32 units in PM (was instructed to restart 70/30 insulin by PCP Dr. Ginette Pitman with [facility] in [place] on 03/27/17)  Current Insulin Orders: Lantus 15 units daily                                       Novolog Moderate Correction Scale/ SSI (0-15 units) TID AC                                        Novolog 6 units TID with meals       MD- Please consider the following in-hospital insulin adjustments:  1. Increase Lantus to 18 units daily (20% increase)  2. Increase Novolog Meal Coverage slightly to: Novolog 7 units TID with meals (hold if pt eats <50% of meal)    MD- Please make sure to give patient a Rx for CBG meter at time of discharge (Order # [id number])  Please also ask patient if he needs Rx for 70/30 Insulin.  May not need Rx for insulin as PCP (Dr. Ginette Pitman) may have given him one on 03/27/17.     --Will follow patient during hospitalization--  Wyn Quaker RN, MSN, CDE Diabetes Coordinator Inpatient Glycemic Control Team Team Pager: [phone] (8a-5p)

## 2017-04-02 NOTE — Plan of Care (Signed)
Problem: Physical Regulation: Goal: Ability to maintain clinical measurements within normal limits will improve Outcome: Progressing Patient aware of need to move around and walk in order to improve circulation. Reports pain with movement.

## 2017-04-02 NOTE — Progress Notes (Signed)
  RD consulted for nutrition education regarding diabetes. Full assessment and brief nutrition education initiated yesterday. Pt with right leg wound with plan for debridement and subsequent skin graft  Lab Results  Component Value Date   HGBA1C >15.5 (H) 03/31/2017    RD provided "Carbohydrate Counting for People with Diabetes" handout from the Academy of Nutrition and Dietetics. Discussed different food groups and their effects on blood sugar, emphasizing carbohydrate-containing foods. Provided list of carbohydrates and recommended serving sizes of common foods.  Discussed importance of controlled and consistent carbohydrate intake throughout the day. Provided examples of ways to balance meals/snacks and encouraged intake of high-fiber, whole grain complex carbohydrates. Discouraged consumption of sugar sweetened beverages. Pt works 3rd shift and sometimes eating schedule is erratic. Reinforced importance of eating something soon after waking and eating every 4-5 hours while awake.  Teach back method attempted.   Expect fair compliance. Pt messing with cell phone, not very engaged during education. RD will attempt further education on follow-up  Kerman Passey MS, [place], LDN [phone] Pager  [phone] Weekend/On-Call Pager

## 2017-04-02 NOTE — Evaluation (Signed)
Physical Therapy Evaluation Patient Details Name: [name] MRN: [id number] DOB: December 23, 1970 Today's Date: 04/02/2017   History of Present Illness  [name] is a 46 y.o. male who presents with diabetic insensate neuropathy history of hypertension who is currently on Xarelto for a DVT. Patient states that he noticed a blunt trauma to the lateral aspect of his right calf about 3 months ago. Patient states she's been doing wound care. Patient states returned from a driving trip in [place] 2 weeks ago and had acute swelling of the right leg. Patient did go to emergency room and patient's Dopplers were positive for DVT in the right lower extremity popliteal fossa and patient was started on Xarelto. Patient states that since that time he's had foul-smelling drainage peeling necrotic skin and black eschar to the lateral aspect of the right calf. Plan for wound vac placement.  Clinical Impression  Pt admitted with above diagnosis. Pt currently with functional limitations due to the deficits listed below (see PT Problem List). Pt very limited by pain, does not want to move at all or get out of bed. Highly encouraged to mobilize and did participate with pivoting to chair with +2 min A.  Pt will benefit from skilled PT to increase their independence and safety with mobility to allow discharge to the venue listed below.       Follow Up Recommendations SNF    Equipment Recommendations  None recommended by PT    Recommendations for Other Services       Precautions / Restrictions Precautions Precautions: Fall Restrictions Weight Bearing Restrictions: No      Mobility  Bed Mobility Overal bed mobility: Needs Assistance Bed Mobility: Supine to Sit     Supine to sit: +2 for physical assistance;Mod assist     General bed mobility comments: needed continuous support of RLE throughout all mobility, pt able to reach across body to grasp rail with L hand and initiate mvmt to EOB but mod  A needed to complete task as pt was limited by pain.   Transfers Overall transfer level: Needs assistance Equipment used: None Transfers: Stand Pivot Transfers   Stand pivot transfers: Min assist;+2 physical assistance       General transfer comment: assist to support RLE as he felt that he couldn't tolerate it being in a dependent position and min A at trunk for safety  Ambulation/Gait             General Gait Details: pt felt unable at this time due to pain  Stairs            Wheelchair Mobility    Modified Rankin (Stroke Patients Only)       Balance Overall balance assessment: Needs assistance Sitting-balance support: Single extremity supported Sitting balance-Leahy Scale: Fair     Standing balance support: Bilateral upper extremity supported Standing balance-Leahy Scale: Poor                               Pertinent Vitals/Pain Pain Assessment: Faces Faces Pain Scale: Hurts worst Pain Location: R distal LE Pain Descriptors / Indicators: Moaning;Guarding;Aching Pain Intervention(s): Limited activity within patient's tolerance;Monitored during session;Premedicated before session    Home Living Family/patient expects to be discharged to:: Private residence Living Arrangements: Parent Available Help at Discharge: Family;Available 24 hours/day Type of Home: House Home Access: Level entry     Home Layout: One level Home Equipment: Walker - 2 wheels;Cane -  single point Additional Comments: pt had begun using cane and then RW to keep wt off RLE prior to admission    Prior Function Level of Independence: Independent         Comments: pt works in a factory making socks 8 hrs/ day     Journalist, newspaper        Extremity/Trunk Assessment   Upper Extremity Assessment Upper Extremity Assessment: Overall WFL for tasks assessed    Lower Extremity Assessment Lower Extremity Assessment: Overall WFL for tasks assessed    Cervical /  Trunk Assessment Cervical / Trunk Assessment: Normal  Communication   Communication: No difficulties  Cognition Arousal/Alertness: Awake/alert Behavior During Therapy: WFL for tasks assessed/performed;Anxious Overall Cognitive Status: Within Functional Limits for tasks assessed                                 General Comments: pt with general low motivation too move, very affected by pain.       General Comments General comments (skin integrity, edema, etc.): pt educated on the need to remain mobile for bowel function, respiratory health, and circulation, pt minimally agreeable. Spoke to RN about having him go to [facility] for toileting instead of using bed pan.     Exercises     Assessment/Plan    PT Assessment Patient needs continued PT services  PT Problem List Decreased activity tolerance;Decreased balance;Decreased mobility;Decreased knowledge of precautions;Obesity;Pain;Decreased skin integrity       PT Treatment Interventions DME instruction;Gait training;Functional mobility training;Therapeutic activities;Therapeutic exercise;Balance training;Patient/family education    PT Goals (Current goals can be found in the Care Plan section)  Acute Rehab PT Goals Patient Stated Goal: decreased pain PT Goal Formulation: With patient Time For Goal Achievement: 04/16/17 Potential to Achieve Goals: Fair    Frequency Min 3X/week   Barriers to discharge        Co-evaluation               AM-PAC PT "6 Clicks" Daily Activity  Outcome Measure Difficulty turning over in bed (including adjusting bedclothes, sheets and blankets)?: A Little Difficulty moving from lying on back to sitting on the side of the bed? : Unable Difficulty sitting down on and standing up from a chair with arms (e.g., wheelchair, bedside commode, etc,.)?: Unable Help needed moving to and from a bed to chair (including a wheelchair)?: A Lot Help needed walking in hospital room?: A Lot Help needed  climbing 3-5 steps with a railing? : Total 6 Click Score: 10    End of Session Equipment Utilized During Treatment: Gait belt Activity Tolerance: Patient limited by pain Patient left: in chair;with call bell/phone within reach Nurse Communication: Mobility status PT Visit Diagnosis: Difficulty in walking, not elsewhere classified (R26.2);Pain Pain - Right/Left: Right Pain - part of body: Leg    Time: 0910-0933 PT Time Calculation (min) (ACUTE ONLY): 23 min   Charges:   PT Evaluation $PT Eval Moderate Complexity: 1 Mod PT Treatments $Therapeutic Activity: 8-22 mins   PT G Codes:        Leighton Roach, PT  Acute Rehab Services  [place] 04/02/2017, 9:54 AM

## 2017-04-03 ENCOUNTER — Inpatient Hospital Stay (HOSPITAL_COMMUNITY): Payer: BLUE CROSS/BLUE SHIELD

## 2017-04-03 ENCOUNTER — Other Ambulatory Visit (INDEPENDENT_AMBULATORY_CARE_PROVIDER_SITE_OTHER): Payer: Self-pay | Admitting: Family

## 2017-04-03 ENCOUNTER — Inpatient Hospital Stay (HOSPITAL_COMMUNITY): Payer: BLUE CROSS/BLUE SHIELD | Admitting: Certified Registered Nurse Anesthetist

## 2017-04-03 ENCOUNTER — Encounter (HOSPITAL_COMMUNITY)
Admission: EM | Disposition: A | Discharge status: Skilled Nursing Facility | Payer: Self-pay | Source: Home / Self Care | Attending: Internal Medicine

## 2017-04-03 DIAGNOSIS — E11622 Type 2 diabetes mellitus with other skin ulcer: Secondary | ICD-10-CM

## 2017-04-03 DIAGNOSIS — M726 Necrotizing fasciitis: Secondary | ICD-10-CM

## 2017-04-03 DIAGNOSIS — L03115 Cellulitis of right lower limb: Secondary | ICD-10-CM

## 2017-04-03 DIAGNOSIS — A419 Sepsis, unspecified organism: Principal | ICD-10-CM

## 2017-04-03 HISTORY — PX: I&D EXTREMITY: SHX5045

## 2017-04-03 LAB — CBC WITH DIFFERENTIAL/PLATELET
Basophils Absolute: 0 10*3/uL (ref 0.0–0.1)
Basophils Relative: 0 %
Eosinophils Absolute: 0.1 10*3/uL (ref 0.0–0.7)
Eosinophils Relative: 0 %
HCT: 38.6 % — ABNORMAL LOW (ref 39.0–52.0)
Hemoglobin: 12.4 g/dL — ABNORMAL LOW (ref 13.0–17.0)
Lymphocytes Relative: 13 %
Lymphs Abs: 1.9 10*3/uL (ref 0.7–4.0)
MCH: 28.3 pg (ref 26.0–34.0)
MCHC: 32.1 g/dL (ref 30.0–36.0)
MCV: 88.1 fL (ref 78.0–100.0)
Monocytes Absolute: 1.4 10*3/uL — ABNORMAL HIGH (ref 0.1–1.0)
Monocytes Relative: 10 %
Neutro Abs: 10.6 10*3/uL — ABNORMAL HIGH (ref 1.7–7.7)
Neutrophils Relative %: 77 %
Platelets: 378 10*3/uL (ref 150–400)
RBC: 4.38 MIL/uL (ref 4.22–5.81)
RDW: 12.4 % (ref 11.5–15.5)
WBC: 13.9 10*3/uL — ABNORMAL HIGH (ref 4.0–10.5)

## 2017-04-03 LAB — COMPREHENSIVE METABOLIC PANEL
ALT: 11 U/L — ABNORMAL LOW (ref 17–63)
AST: 14 U/L — ABNORMAL LOW (ref 15–41)
Albumin: 1.7 g/dL — ABNORMAL LOW (ref 3.5–5.0)
Alkaline Phosphatase: 69 U/L (ref 38–126)
Anion gap: 8 (ref 5–15)
BUN: 13 mg/dL (ref 6–20)
CO2: 27 mmol/L (ref 22–32)
Calcium: 8.3 mg/dL — ABNORMAL LOW (ref 8.9–10.3)
Chloride: 102 mmol/L (ref 101–111)
Creatinine, Ser: 1.77 mg/dL — ABNORMAL HIGH (ref 0.61–1.24)
GFR calc Af Amer: 52 mL/min — ABNORMAL LOW (ref >60)
GFR calc non Af Amer: 45 mL/min — ABNORMAL LOW (ref >60)
Glucose, Bld: 176 mg/dL — ABNORMAL HIGH (ref 65–99)
Potassium: 3.2 mmol/L — ABNORMAL LOW (ref 3.5–5.1)
Sodium: 137 mmol/L (ref 135–145)
Total Bilirubin: 0.7 mg/dL (ref 0.3–1.2)
Total Protein: 6.9 g/dL (ref 6.5–8.1)

## 2017-04-03 LAB — GLUCOSE, CAPILLARY
Glucose-Capillary: 159 mg/dL — ABNORMAL HIGH (ref 65–99)
Glucose-Capillary: 178 mg/dL — ABNORMAL HIGH (ref 65–99)
Glucose-Capillary: 158 mg/dL — ABNORMAL HIGH (ref 65–99)
Glucose-Capillary: 169 mg/dL — ABNORMAL HIGH (ref 65–99)
Glucose-Capillary: 252 mg/dL — ABNORMAL HIGH (ref 65–99)

## 2017-04-03 LAB — MAGNESIUM: Magnesium: 1.6 mg/dL — ABNORMAL LOW (ref 1.7–2.4)

## 2017-04-03 SURGERY — IRRIGATION AND DEBRIDEMENT EXTREMITY
Anesthesia: General | Laterality: Right

## 2017-04-03 MED ORDER — ONDANSETRON HCL 4 MG/2ML IJ SOLN
INTRAMUSCULAR | Status: DC | PRN
  Administered 2017-04-03: 4 mg via INTRAVENOUS

## 2017-04-03 MED ORDER — METOCLOPRAMIDE HCL 5 MG PO TABS: 5.0000 mg | ORAL_TABLET | Freq: Three times a day (TID) | ORAL | Status: DC | PRN

## 2017-04-03 MED ORDER — METHOCARBAMOL 1000 MG/10ML IJ SOLN: 500.0000 mg | Freq: Four times a day (QID) | INTRAVENOUS | Status: DC | PRN

## 2017-04-03 MED ORDER — PROPOFOL 10 MG/ML IV BOLUS
INTRAVENOUS | Status: DC | PRN
  Administered 2017-04-03: 200 mg via INTRAVENOUS

## 2017-04-03 MED ORDER — ONDANSETRON HCL 4 MG/2ML IJ SOLN
INTRAMUSCULAR | Status: AC
  Filled 2017-04-03: qty 2

## 2017-04-03 MED ORDER — FENTANYL CITRATE (PF) 100 MCG/2ML IJ SOLN
INTRAMUSCULAR | Status: AC
  Administered 2017-04-03: 50 ug via INTRAVENOUS
  Filled 2017-04-03: qty 2

## 2017-04-03 MED ORDER — FENTANYL CITRATE (PF) 250 MCG/5ML IJ SOLN
INTRAMUSCULAR | Status: AC
Start: 2017-04-03 — End: ?
  Filled 2017-04-03: qty 5

## 2017-04-03 MED ORDER — SODIUM CHLORIDE 0.9 % IV SOLN
INTRAVENOUS | Status: DC
  Administered 2017-04-08: 23:00:00 via INTRAVENOUS

## 2017-04-03 MED ORDER — MAGNESIUM CITRATE PO SOLN: 1.0000 | Freq: Once | ORAL | Status: DC | PRN

## 2017-04-03 MED ORDER — MAGNESIUM SULFATE 4 GM/100ML IV SOLN
4.0000 g | Freq: Once | INTRAVENOUS | Status: AC
  Administered 2017-04-03: 4 g via INTRAVENOUS
  Filled 2017-04-03: qty 100

## 2017-04-03 MED ORDER — AMLODIPINE BESYLATE 10 MG PO TABS
10.0000 mg | ORAL_TABLET | Freq: Every day | ORAL | Status: DC
  Administered 2017-04-04 – 2017-04-12 (×9): 10 mg via ORAL
  Filled 2017-04-03 (×9): qty 1

## 2017-04-03 MED ORDER — FENTANYL CITRATE (PF) 100 MCG/2ML IJ SOLN
25.0000 ug | INTRAMUSCULAR | Status: DC | PRN
  Administered 2017-04-03 (×2): 50 ug via INTRAVENOUS

## 2017-04-03 MED ORDER — LACTATED RINGERS IV SOLN
INTRAVENOUS | Status: DC
  Administered 2017-04-03: 16:00:00 via INTRAVENOUS

## 2017-04-03 MED ORDER — ACETAMINOPHEN 325 MG PO TABS: 650.0000 mg | ORAL_TABLET | Freq: Four times a day (QID) | ORAL | Status: DC | PRN

## 2017-04-03 MED ORDER — OXYCODONE HCL 5 MG PO TABS
5.0000 mg | ORAL_TABLET | ORAL | Status: DC | PRN
  Administered 2017-04-03 – 2017-04-04 (×2): 10 mg via ORAL
  Administered 2017-04-04: 5 mg via ORAL
  Filled 2017-04-03: qty 2
  Filled 2017-04-03: qty 1
  Filled 2017-04-03: qty 2

## 2017-04-03 MED ORDER — 0.9 % SODIUM CHLORIDE (POUR BTL) OPTIME
TOPICAL | Status: DC | PRN
  Administered 2017-04-03: 1000 mL

## 2017-04-03 MED ORDER — DOCUSATE SODIUM 100 MG PO CAPS
100.0000 mg | ORAL_CAPSULE | Freq: Two times a day (BID) | ORAL | Status: DC
  Administered 2017-04-03 – 2017-04-04 (×3): 100 mg via ORAL
  Filled 2017-04-03 (×3): qty 1

## 2017-04-03 MED ORDER — ONDANSETRON HCL 4 MG PO TABS: 4.0000 mg | ORAL_TABLET | Freq: Four times a day (QID) | ORAL | Status: DC | PRN

## 2017-04-03 MED ORDER — DEXAMETHASONE SODIUM PHOSPHATE 10 MG/ML IJ SOLN
INTRAMUSCULAR | Status: AC
  Filled 2017-04-03: qty 1

## 2017-04-03 MED ORDER — ACETAMINOPHEN 650 MG RE SUPP: 650.0000 mg | Freq: Four times a day (QID) | RECTAL | Status: DC | PRN

## 2017-04-03 MED ORDER — SUCCINYLCHOLINE CHLORIDE 200 MG/10ML IV SOSY
PREFILLED_SYRINGE | INTRAVENOUS | Status: AC
  Filled 2017-04-03: qty 10

## 2017-04-03 MED ORDER — FENTANYL CITRATE (PF) 100 MCG/2ML IJ SOLN
INTRAMUSCULAR | Status: DC | PRN
  Administered 2017-04-03 (×5): 50 ug via INTRAVENOUS

## 2017-04-03 MED ORDER — POLYETHYLENE GLYCOL 3350 17 G PO PACK: 17.0000 g | PACK | Freq: Every day | ORAL | Status: DC | PRN

## 2017-04-03 MED ORDER — ONDANSETRON HCL 4 MG/2ML IJ SOLN: 4.0000 mg | Freq: Four times a day (QID) | INTRAMUSCULAR | Status: DC | PRN

## 2017-04-03 MED ORDER — MIDAZOLAM HCL 5 MG/5ML IJ SOLN
INTRAMUSCULAR | Status: DC | PRN
Start: 2017-04-03 — End: 2017-04-03
  Administered 2017-04-03: 2 mg via INTRAVENOUS

## 2017-04-03 MED ORDER — ROCURONIUM BROMIDE 10 MG/ML (PF) SYRINGE
PREFILLED_SYRINGE | INTRAVENOUS | Status: AC
  Filled 2017-04-03: qty 5

## 2017-04-03 MED ORDER — AMLODIPINE BESYLATE 5 MG PO TABS
5.0000 mg | ORAL_TABLET | Freq: Once | ORAL | Status: AC
  Administered 2017-04-03: 5 mg via ORAL
  Filled 2017-04-03: qty 1

## 2017-04-03 MED ORDER — SODIUM CHLORIDE 0.9 % IR SOLN
Status: DC | PRN
  Administered 2017-04-03: 3000 mL

## 2017-04-03 MED ORDER — BISACODYL 10 MG RE SUPP: 10.0000 mg | Freq: Every day | RECTAL | Status: DC | PRN

## 2017-04-03 MED ORDER — POVIDONE-IODINE 10 % EX SWAB: 2.0000 "application " | Freq: Once | CUTANEOUS | Status: DC

## 2017-04-03 MED ORDER — METHOCARBAMOL 500 MG PO TABS: 500.0000 mg | ORAL_TABLET | Freq: Four times a day (QID) | ORAL | Status: DC | PRN

## 2017-04-03 MED ORDER — SUGAMMADEX SODIUM 200 MG/2ML IV SOLN
INTRAVENOUS | Status: AC
  Filled 2017-04-03: qty 2

## 2017-04-03 MED ORDER — EPHEDRINE 5 MG/ML INJ
INTRAVENOUS | Status: AC
  Filled 2017-04-03: qty 10

## 2017-04-03 MED ORDER — POTASSIUM CHLORIDE CRYS ER 20 MEQ PO TBCR
40.0000 meq | EXTENDED_RELEASE_TABLET | Freq: Once | ORAL | Status: AC
  Administered 2017-04-03: 40 meq via ORAL
  Filled 2017-04-03: qty 2

## 2017-04-03 MED ORDER — DEXAMETHASONE SODIUM PHOSPHATE 10 MG/ML IJ SOLN
INTRAMUSCULAR | Status: DC | PRN
  Administered 2017-04-03: 10 mg via INTRAVENOUS

## 2017-04-03 MED ORDER — METHOCARBAMOL 500 MG PO TABS
500.0000 mg | ORAL_TABLET | Freq: Four times a day (QID) | ORAL | Status: DC | PRN
  Administered 2017-04-03: 500 mg via ORAL
  Filled 2017-04-03: qty 1

## 2017-04-03 MED ORDER — LIDOCAINE 2% (20 MG/ML) 5 ML SYRINGE
INTRAMUSCULAR | Status: AC
  Filled 2017-04-03: qty 5

## 2017-04-03 MED ORDER — METHOCARBAMOL 1000 MG/10ML IJ SOLN
500.0000 mg | Freq: Four times a day (QID) | INTRAVENOUS | Status: DC | PRN
  Filled 2017-04-03: qty 5

## 2017-04-03 MED ORDER — LIDOCAINE HCL (CARDIAC) 20 MG/ML IV SOLN
INTRAVENOUS | Status: DC | PRN
  Administered 2017-04-03: 100 mg via INTRAVENOUS

## 2017-04-03 MED ORDER — PHENYLEPHRINE 40 MCG/ML (10ML) SYRINGE FOR IV PUSH (FOR BLOOD PRESSURE SUPPORT)
PREFILLED_SYRINGE | INTRAVENOUS | Status: AC
  Filled 2017-04-03: qty 10

## 2017-04-03 MED ORDER — PROPOFOL 10 MG/ML IV BOLUS
INTRAVENOUS | Status: AC
  Filled 2017-04-03: qty 20

## 2017-04-03 MED ORDER — CHLORHEXIDINE GLUCONATE 4 % EX LIQD: 60.0000 mL | Freq: Once | CUTANEOUS | Status: DC

## 2017-04-03 MED ORDER — MIDAZOLAM HCL 2 MG/2ML IJ SOLN
INTRAMUSCULAR | Status: AC
  Filled 2017-04-03: qty 2

## 2017-04-03 MED ORDER — METOCLOPRAMIDE HCL 5 MG/ML IJ SOLN: 5.0000 mg | Freq: Three times a day (TID) | INTRAMUSCULAR | Status: DC | PRN

## 2017-04-03 MED ORDER — HYDROMORPHONE HCL 1 MG/ML IJ SOLN: 1.0000 mg | INTRAMUSCULAR | Status: DC | PRN

## 2017-04-03 SURGICAL SUPPLY — 33 items
BLADE SURG 21 STRL SS (BLADE) IMPLANT
BNDG COHESIVE 6X5 TAN STRL LF (GAUZE/BANDAGES/DRESSINGS) IMPLANT
BNDG GAUZE ELAST 4 BULKY (GAUZE/BANDAGES/DRESSINGS) IMPLANT
CANISTER SUCTION 1500CC (MISCELLANEOUS) ×2 IMPLANT
CASSETTE VERAFLO VERALINK (MISCELLANEOUS) ×2 IMPLANT
COVER SURGICAL LIGHT HANDLE (MISCELLANEOUS) ×2 IMPLANT
DRAPE U-SHAPE 47X51 STRL (DRAPES) ×2 IMPLANT
DRESSING VERAFLO CLEANSE CC (GAUZE/BANDAGES/DRESSINGS) ×1 IMPLANT
DRSG ADAPTIC 3X8 NADH LF (GAUZE/BANDAGES/DRESSINGS) ×2 IMPLANT
DRSG VERAFLO CLEANSE CC (GAUZE/BANDAGES/DRESSINGS) ×2
DURAPREP 26ML APPLICATOR (WOUND CARE) ×2 IMPLANT
ELECT REM PT RETURN 9FT ADLT (ELECTROSURGICAL) ×2
ELECTRODE REM PT RTRN 9FT ADLT (ELECTROSURGICAL) ×1 IMPLANT
GAUZE SPONGE 4X4 12PLY STRL (GAUZE/BANDAGES/DRESSINGS) IMPLANT
GLOVE BIOGEL PI IND STRL 9 (GLOVE) ×2 IMPLANT
GLOVE BIOGEL PI INDICATOR 9 (GLOVE) ×2
GLOVE SURG ORTHO 9.0 STRL STRW (GLOVE) ×4 IMPLANT
GOWN STRL REUS W/ TWL XL LVL3 (GOWN DISPOSABLE) ×3 IMPLANT
GOWN STRL REUS W/TWL XL LVL3 (GOWN DISPOSABLE) ×3
HANDPIECE INTERPULSE COAX TIP (DISPOSABLE) ×1
KIT BASIN OR (CUSTOM PROCEDURE TRAY) ×2 IMPLANT
KIT ROOM TURNOVER OR (KITS) ×2 IMPLANT
MANIFOLD NEPTUNE II (INSTRUMENTS) ×2 IMPLANT
NS IRRIG 1000ML POUR BTL (IV SOLUTION) ×2 IMPLANT
PACK ORTHO EXTREMITY (CUSTOM PROCEDURE TRAY) ×2 IMPLANT
PAD ARMBOARD 7.5X6 YLW CONV (MISCELLANEOUS) ×2 IMPLANT
SET HNDPC FAN SPRY TIP SCT (DISPOSABLE) ×1 IMPLANT
STOCKINETTE IMPERVIOUS 9X36 MD (GAUZE/BANDAGES/DRESSINGS) IMPLANT
SWAB COLLECTION DEVICE MRSA (MISCELLANEOUS) IMPLANT
SWAB CULTURE ESWAB REG 1ML (MISCELLANEOUS) IMPLANT
TOWEL OR 17X26 10 PK STRL BLUE (TOWEL DISPOSABLE) ×2 IMPLANT
TUBE CONNECTING 12X1/4 (SUCTIONS) ×2 IMPLANT
YANKAUER SUCT BULB TIP NO VENT (SUCTIONS) ×2 IMPLANT

## 2017-04-03 NOTE — Anesthesia Preprocedure Evaluation (Signed)
Anesthesia Evaluation  Patient identified by MRN, date of birth, ID band Patient awake    Reviewed: Allergy & Precautions, NPO status , Patient's Chart, lab work & pertinent test results  Airway Mallampati: II  TM Distance: >3 FB     Dental   Pulmonary neg pulmonary ROS,    breath sounds clear to auscultation       Cardiovascular hypertension, + Peripheral Vascular Disease   Rhythm:Regular Rate:Normal     Neuro/Psych    GI/Hepatic negative GI ROS, Neg liver ROS,   Endo/Other  diabetes  Renal/GU Renal disease     Musculoskeletal   Abdominal   Peds  Hematology   Anesthesia Other Findings   Reproductive/Obstetrics                             Anesthesia Physical Anesthesia Plan  ASA: III  Anesthesia Plan: General   Post-op Pain Management:    Induction: Intravenous  PONV Risk Score and Plan: 2 and Ondansetron, Dexamethasone and Treatment may vary due to age or medical condition  Airway Management Planned: LMA  Additional Equipment:   Intra-op Plan:   Post-operative Plan: Extubation in OR  Informed Consent: I have reviewed the patients History and Physical, chart, labs and discussed the procedure including the risks, benefits and alternatives for the proposed anesthesia with the patient or authorized representative who has indicated his/her understanding and acceptance.   Dental advisory given  Plan Discussed with: CRNA and Anesthesiologist  Anesthesia Plan Comments:         Anesthesia Quick Evaluation

## 2017-04-03 NOTE — Op Note (Signed)
03/30/2017 - 04/03/2017  5:17 PM  PATIENT:  [name]    PRE-OPERATIVE DIAGNOSIS:  Necrotizing Fasciitis Right Calf  POST-OPERATIVE DIAGNOSIS:  Same  PROCEDURE:  IRRIGATION AND DEBRIDEMENT RIGHT LEG, APPLY WOUND VAC Debridement of deep abscess. Sharp excision of fascia skin and muscle with a 10 blade knife. Application of a vera flow wound VAC.  SURGEON:  Newt Minion, MD  PHYSICIAN ASSISTANT:None ANESTHESIA:   General  PREOPERATIVE INDICATIONS:  ZIDAN HELGET is a  46 y.o. male with a diagnosis of Necrotizing Fasciitis Right Calf who failed conservative measures and elected for surgical management.    The risks benefits and alternatives were discussed with the patient preoperatively including but not limited to the risks of infection, bleeding, nerve injury, cardiopulmonary complications, the need for revision surgery, among others, and the patient was willing to proceed.  OPERATIVE IMPLANTS: Vera flow instillation wound VAC  OPERATIVE FINDINGS: Massive abscess with necrotizing fasciitis. Purulence sent for cultures.  OPERATIVE PROCEDURE: Patient is a 46 year old gentleman who presents with massive necrotizing fasciitis of the lateral and posterior aspect of the right calf. Patient is undergoing appropriate IV antibiotics and presents at this time for surgical intervention.  Patient was brought to operating room and underwent a general anesthetic. After adequate levels anesthesia obtained patient's right lower extremity was prepped using DuraPrep draped into a sterile field a timeout was called. Elliptical incision was made around the necrotic wound there was a massive amount of purulence this was sent for cultures. The area of necrotizing fasciitis and necrotic skin was 12 x 17 cm. All the necrotic tissue was excised. There is healthy viable muscle. This was irrigated with pulsatile lavage. The area was then cleansed and benzoin was used to prep the skin. The fenestrated [employer]  foam was used and this was secured with [place]. This had a good suction fit 26 mL was set for the irrigation for 10 minutes soaked time 2 hours of suction. We'll plan for return to the operating room possibly Friday.

## 2017-04-03 NOTE — Plan of Care (Signed)
Problem: Physical Regulation: Goal: Ability to maintain clinical measurements within normal limits will improve Outcome: Not Progressing Patient not wanting to get up with physical therapy. Patient did sit in chair yesterday after encouragement. Will encourage patient to walk and sit in chair and educate on importance of mobility for increased perfusion to leg.

## 2017-04-03 NOTE — Transfer of Care (Signed)
Immediate Anesthesia Transfer of Care Note  Patient: [name]  Procedure(s) Performed: Procedure(s): IRRIGATION AND DEBRIDEMENT RIGHT LEG, APPLY WOUND VAC (Right)  Patient Location: PACU  Anesthesia Type:General  Level of Consciousness: awake, alert , oriented and patient cooperative  Airway & Oxygen Therapy: Patient Spontanous Breathing and Patient connected to face mask oxygen  Post-op Assessment: Report given to RN, Post -op Vital signs reviewed and stable and Patient moving all extremities X 4  Post vital signs: Reviewed and stable  Last Vitals:  Vitals:   04/02/17 2130 04/03/17 0842  BP: (!) 148/87 (!) 161/90  Pulse: 100 92  Resp: 19 19  Temp: 37.7 C 37.8 C  SpO2: 98% 98%    Last Pain:  Vitals:   04/03/17 0842  TempSrc: Oral  PainSc:       Patients Stated Pain Goal: 2 (24/58/09 9833)  Complications: No apparent anesthesia complications

## 2017-04-03 NOTE — Progress Notes (Signed)
Patient ID: [name], male   DOB: 07/25/1970, 46 y.o.   MRN: [id number] Plan for debridement right leg application of instillation wound VAC. Discussed with the patient importance of nothing by mouth daily.

## 2017-04-03 NOTE — Progress Notes (Signed)
VASCULAR LAB PRELIMINARY  ARTERIAL  ABI completed: Unable to obtain right ABI due to ankle wound and popliteal deep vein thrombosis. Posterior tibial signal demonstrated biphasic waveforms, and the dorsalis pedis demonstrated monophasic waveforms. Left ABI of 0.76 are suggestive of moderate arterial occlusive disease at rest. Right TBI of 0.3 is suggestive of abnormal arterial flow at rest. Unable to obtain left TBI due to low amplitude waveforms.   RIGHT    LEFT    PRESSURE WAVEFORM  PRESSURE WAVEFORM  BRACHIAL 168 Triphasic BRACHIAL 165 Triphasic  DP  Monophasic DP 83 Monophasic  PT  Biphasic PT 127 Biphasic  GREAT TOE 51 NA GREAT TOE  NA    RIGHT LEFT  ABI  0.76     Legrand Como, RVT 04/03/2017, 11:46 AM

## 2017-04-03 NOTE — Progress Notes (Signed)
PROGRESS NOTE    [name]  UJW:[id number] DOB: 10/21/1970 DOA: 03/30/2017 PCP: Tracie Harrier, MD    Brief Narrative:  46 y/o male with poorly controlled insulin requiring diabetes, HTN, gout, CKD, recently diagnosed DVT RLE on xarelto who presented to ED with fever and poorly healing right calf leg wound.     Assessment & Plan:   Principal Problem:   Sepsis ([place]) Active Problems:   HTN (hypertension)   Hyperglycemia due to type 2 diabetes mellitus ([place])   Acute kidney injury superimposed on chronic kidney disease (HCC)   Hypoalbuminemia   Right leg DVT (HCC)   Diabetic ulcer of calf ([place])   Noncompliance w/medication treatment due to intermit use of medication   Cellulitis of right lower leg   Sinus tachycardia   Fever   Chronic anticoagulation  1. Sepsis - secondary to cellulitis and infection of right leg calf wound - fever curve trended down. Leukocytosis fluctuating. Lactic acid trended down. Continue empiric IV vancomycin and IV Zosyn. Patient for wound debridement and subsequent skin grafting today. Per orthopedics.  2. Right leg wound -  orthopedics planning wound debridement and subsequent skin graft today 04/03/2017 per Dr. Sharol Given.     3. Type 2 diabetes mellitus, insulin requiring with neurological complications - Pt reports that he has not had insulin to take in over 1 year.  He recently established with a PCP and is willing to take insulin again. Hemoglobin A1c greater than 15.5.  Basal bolus insulin ordered. TSH normal.  CBG 158 - 178. Patient currently on Lantus 25 units daily, new coverage insulin 8 units 3 times daily as well as sliding scale insulin. Postoperatively will transition to 70/30 regimen and titrate as needed as patient will likely be discharged home on this. Patient states he's on 30 units of 70/30 in the morning and 18 units at bedtime.  4. Acute Kidney Injury on CKD (suspected) - suspect he has CKD from uncontrolled hypertension and diabetes  mellitus.  IV fluids ordered for hydration, avoid nephrotoxins, follow renal function panel. Renal function trending down. 5. Essential hypertension - poorly controlled, increase amiodarone pain to 10 mg daily. Continue metoprolol.  6. DVT of RLE - (acute) He is on xarelto and saw hematology recently and they are suggesting he be on anticoagulation indefinitely given his risk factors for DVT.  He will follow up in 6 months with hematology to discuss further. Postop orthopedics to advise when anticoagulation may be resumed.   7. Malnutrition of chronic disease - dietitian consultion pending.   8. Hypokalemia--replete. Keep hematocrit greater than 2.    DVT prophylaxis: Xarelto on hold pending surgery. Code Status: Full Family Communication: Updated patient. No family at bedside. Disposition Plan: Home versus skilled nursing facility per orthopedics.   Consultants:   Orthopedics: Dr. Sharol Given 04/01/2017    Procedure  Chest x-ray 04/01/2017    Plain films of the right tib-fib 03/30/2017  ABIs 04/03/2017  Antimicrobials:   IV Zosyn 03/30/2017  IV vancomycin 03/30/2017    Subjective: Patient complaining bed. Denies any chest no shortness of breath. No abdominal pain.  Objective: Vitals:   04/02/17 1712 04/02/17 1900 04/02/17 2130 04/03/17 0842  BP: (!) 177/104 (!) 154/102 (!) 148/87 (!) 161/90  Pulse: 94  100 92  Resp: 19 16 19 19   Temp: (!) 102.1 F (38.9 C) (!) 100.9 F (38.3 C) 99.9 F (37.7 C) 100.1 F (37.8 C)  TempSrc: Oral  Oral Oral  SpO2: 98% 97% 98% 98%  Weight:   111.2 kg (245 lb 2.4 oz)   Height:        Intake/Output Summary (Last 24 hours) at 04/03/17 1221 Last data filed at 04/03/17 0959  Gross per 24 hour  Intake             1695 ml  Output             1150 ml  Net              545 ml   Filed Weights   03/31/17 2331 04/01/17 2138 04/02/17 2130  Weight: 109.7 kg (241 lb 12.8 oz) 110.5 kg (243 lb 8 oz) 111.2 kg (245 lb 2.4 oz)     Examination:  General exam: Appears calm and comfortable  Respiratory system: Clear to auscultation. Respiratory effort normal. Cardiovascular system: S1 & S2 heard, RRR. No JVD, murmurs, rubs, gallops or clicks. No pedal edema. Gastrointestinal system: Abdomen is nondistended, soft and nontender. No organomegaly or masses felt. Normal bowel sounds heard. Central nervous system: Alert and oriented. No focal neurological deficits. Extremities: Right lower extremity and drenched with serosanguineous fluid.  Skin: No rashes, lesions or ulcers Psychiatry: Judgement and insight appear normal. Mood & affect appropriate.     Data Reviewed: I have personally reviewed following labs and imaging studies  CBC:  Recent Labs Lab 03/30/17 1859 03/31/17 0320 04/01/17 0254 04/02/17 0713 04/03/17 0332  WBC 13.6* 14.7* 15.2* 15.8* 13.9*  NEUTROABS 10.2*  --  11.2* 12.2* 10.6*  HGB 13.5 12.0* 11.3* 12.0* 12.4*  HCT 40.8 36.4* 35.2* 37.2* 38.6*  MCV 86.3 86.9 89.1 88.8 88.1  PLT 352 322 320 357 016   Basic Metabolic Panel:  Recent Labs Lab 03/30/17 1859 03/31/17 0320 04/01/17 0254 04/02/17 0713 04/03/17 0322 04/03/17 0332  NA 129* 133* 137 137  --  137  K 4.4 4.5 4.0 3.7  --  3.2*  CL 94* 102 104 104  --  102  CO2 21* 23 25 25   --  27  GLUCOSE 521* 296* 177* 195*  --  176*  BUN 18 16 12 14   --  13  CREATININE 1.89* 1.43* 1.90* 2.12*  --  1.77*  CALCIUM 8.6* 8.0* 8.2* 8.3*  --  8.3*  MG  --   --   --   --  1.6*  --    GFR: Estimated Creatinine Clearance: 67.4 mL/min (A) (by C-G formula based on SCr of 1.77 mg/dL (H)). Liver Function Tests:  Recent Labs Lab 03/30/17 1859 04/01/17 0254 04/02/17 0713 04/03/17 0332  AST 25 11* 11* 14*  ALT 23 14* 13* 11*  ALKPHOS 107 66 72 69  BILITOT 0.7 0.8 0.8 0.7  PROT 7.9 6.3* 6.7 6.9  ALBUMIN 2.4* 1.7* 1.7* 1.7*   No results for input(s): LIPASE, AMYLASE in the last 168 hours. No results for input(s): AMMONIA in the last 168  hours. Coagulation Profile: No results for input(s): INR, PROTIME in the last 168 hours. Cardiac Enzymes: No results for input(s): CKTOTAL, CKMB, CKMBINDEX, TROPONINI in the last 168 hours. BNP (last 3 results) No results for input(s): PROBNP in the last 8760 hours. HbA1C: No results for input(s): HGBA1C in the last 72 hours. CBG:  Recent Labs Lab 04/02/17 0814 04/02/17 1159 04/02/17 1652 04/02/17 2258 04/03/17 0808  GLUCAP 196* 244* 170* 163* 159*   Lipid Profile: No results for input(s): CHOL, HDL, LDLCALC, TRIG, CHOLHDL, LDLDIRECT in the last 72 hours. Thyroid Function Tests: No results for input(s): TSH,  T4TOTAL, FREET4, T3FREE, THYROIDAB in the last 72 hours. Anemia Panel: No results for input(s): VITAMINB12, FOLATE, FERRITIN, TIBC, IRON, RETICCTPCT in the last 72 hours. Sepsis Labs:  Recent Labs Lab 03/30/17 1920 03/30/17 2118 03/30/17 2317  LATICACIDVEN 2.29* 2.87* 1.66    Recent Results (from the past 240 hour(s))  Blood culture (routine x 2)     Status: None (Preliminary result)   Collection Time: 03/30/17  7:02 PM  Result Value Ref Range Status   Specimen Description BLOOD LEFT FOREARM  Final   Special Requests   Final    BOTTLES DRAWN AEROBIC AND ANAEROBIC Blood Culture adequate volume   Culture NO GROWTH 3 DAYS  Final   Report Status PENDING  Incomplete  Blood culture (routine x 2)     Status: None (Preliminary result)   Collection Time: 03/30/17  7:43 PM  Result Value Ref Range Status   Specimen Description BLOOD RIGHT ANTECUBITAL  Final   Special Requests   Final    BOTTLES DRAWN AEROBIC AND ANAEROBIC Blood Culture adequate volume   Culture NO GROWTH 3 DAYS  Final   Report Status PENDING  Incomplete  Aerobic Culture (superficial specimen)     Status: None   Collection Time: 03/30/17 11:23 PM  Result Value Ref Range Status   Specimen Description WOUND RIGHT LEG  Final   Special Requests Normal  Final   Gram Stain   Final    RARE WBC PRESENT,  PREDOMINANTLY PMN RARE GRAM POSITIVE COCCI IN PAIRS IN CLUSTERS RARE GRAM NEGATIVE RODS    Culture ABUNDANT ESCHERICHIA COLI  Final   Report Status 04/02/2017 FINAL  Final   Organism ID, Bacteria ESCHERICHIA COLI  Final      Susceptibility   Escherichia coli - MIC*    AMPICILLIN >=32 RESISTANT Resistant     CEFAZOLIN 16 SENSITIVE Sensitive     CEFEPIME <=1 SENSITIVE Sensitive     CEFTAZIDIME <=1 SENSITIVE Sensitive     CEFTRIAXONE <=1 SENSITIVE Sensitive     CIPROFLOXACIN <=0.25 SENSITIVE Sensitive     GENTAMICIN <=1 SENSITIVE Sensitive     IMIPENEM <=0.25 SENSITIVE Sensitive     TRIMETH/SULFA >=320 RESISTANT Resistant     AMPICILLIN/SULBACTAM >=32 RESISTANT Resistant     PIP/TAZO <=4 SENSITIVE Sensitive     Extended ESBL NEGATIVE Sensitive     * ABUNDANT ESCHERICHIA COLI         Radiology Studies: No results found.      Scheduled Meds: . amLODipine  5 mg Oral Daily  . feeding supplement (PRO-STAT SUGAR FREE 64)  30 mL Oral BID  . insulin aspart  0-15 Units Subcutaneous TID WC  . insulin aspart  8 Units Subcutaneous TID WC  . insulin glargine  25 Units Subcutaneous Daily  . living well with diabetes book   Does not apply Once  . metoprolol tartrate  25 mg Oral BID  . polyethylene glycol  17 g Oral Daily   Continuous Infusions: . sodium chloride 50 mL/hr at 04/01/17 0710  . magnesium sulfate 1 - 4 g bolus IVPB    . piperacillin-tazobactam (ZOSYN)  IV Stopped (04/03/17 0510)  . vancomycin Stopped (04/03/17 0317)     LOS: 4 days    Time spent: 41 mins    Anne Sebring, MD [facility] Pager [phone]  If 7PM-7AM, please contact night-coverage [email or web address] Password [email or web address] 04/03/2017, 12:21 PM

## 2017-04-03 NOTE — Anesthesia Procedure Notes (Signed)
Procedure Name: LMA Insertion Date/Time: 04/03/2017 4:42 PM Performed by: Mervyn Gay Pre-anesthesia Checklist: Patient identified, Patient being monitored, Timeout performed, Emergency Drugs available and Suction available Patient Re-evaluated:Patient Re-evaluated prior to induction Oxygen Delivery Method: Circle System Utilized Preoxygenation: Pre-oxygenation with 100% oxygen Induction Type: IV induction Ventilation: Mask ventilation without difficulty LMA: LMA inserted LMA Size: 5.0 Number of attempts: 1 Placement Confirmation: positive ETCO2 and breath sounds checked- equal and bilateral Tube secured with: Tape Dental Injury: Teeth and Oropharynx as per pre-operative assessment  Comments: Done by Vesta Mixer CRNA

## 2017-04-04 ENCOUNTER — Other Ambulatory Visit (INDEPENDENT_AMBULATORY_CARE_PROVIDER_SITE_OTHER): Payer: Self-pay | Admitting: Family

## 2017-04-04 ENCOUNTER — Encounter (HOSPITAL_COMMUNITY): Payer: Self-pay | Admitting: Orthopedic Surgery

## 2017-04-04 DIAGNOSIS — Z9114 Patient's other noncompliance with medication regimen: Secondary | ICD-10-CM

## 2017-04-04 DIAGNOSIS — E11621 Type 2 diabetes mellitus with foot ulcer: Secondary | ICD-10-CM

## 2017-04-04 DIAGNOSIS — N179 Acute kidney failure, unspecified: Secondary | ICD-10-CM

## 2017-04-04 DIAGNOSIS — E1122 Type 2 diabetes mellitus with diabetic chronic kidney disease: Secondary | ICD-10-CM

## 2017-04-04 DIAGNOSIS — Z794 Long term (current) use of insulin: Secondary | ICD-10-CM

## 2017-04-04 DIAGNOSIS — N189 Chronic kidney disease, unspecified: Secondary | ICD-10-CM

## 2017-04-04 DIAGNOSIS — A4151 Sepsis due to Escherichia coli [E. coli]: Secondary | ICD-10-CM

## 2017-04-04 DIAGNOSIS — L97309 Non-pressure chronic ulcer of unspecified ankle with unspecified severity: Secondary | ICD-10-CM

## 2017-04-04 DIAGNOSIS — E11622 Type 2 diabetes mellitus with other skin ulcer: Secondary | ICD-10-CM

## 2017-04-04 LAB — CBC WITH DIFFERENTIAL/PLATELET
Basophils Absolute: 0 10*3/uL (ref 0.0–0.1)
Basophils Relative: 0 %
Eosinophils Absolute: 0 10*3/uL (ref 0.0–0.7)
Eosinophils Relative: 0 %
HCT: 37.2 % — ABNORMAL LOW (ref 39.0–52.0)
Hemoglobin: 11.9 g/dL — ABNORMAL LOW (ref 13.0–17.0)
Lymphocytes Relative: 8 %
Lymphs Abs: 1 10*3/uL (ref 0.7–4.0)
MCH: 28.3 pg (ref 26.0–34.0)
MCHC: 32 g/dL (ref 30.0–36.0)
MCV: 88.6 fL (ref 78.0–100.0)
Monocytes Absolute: 0.2 10*3/uL (ref 0.1–1.0)
Monocytes Relative: 2 %
Neutro Abs: 11 10*3/uL — ABNORMAL HIGH (ref 1.7–7.7)
Neutrophils Relative %: 90 %
Platelets: 408 10*3/uL — ABNORMAL HIGH (ref 150–400)
RBC: 4.2 MIL/uL — ABNORMAL LOW (ref 4.22–5.81)
RDW: 12.4 % (ref 11.5–15.5)
WBC: 12.2 10*3/uL — ABNORMAL HIGH (ref 4.0–10.5)

## 2017-04-04 LAB — COMPREHENSIVE METABOLIC PANEL
ALT: 14 U/L — ABNORMAL LOW (ref 17–63)
AST: 18 U/L (ref 15–41)
Albumin: 1.6 g/dL — ABNORMAL LOW (ref 3.5–5.0)
Alkaline Phosphatase: 72 U/L (ref 38–126)
Anion gap: 10 (ref 5–15)
BUN: 21 mg/dL — ABNORMAL HIGH (ref 6–20)
CO2: 22 mmol/L (ref 22–32)
Calcium: 8.3 mg/dL — ABNORMAL LOW (ref 8.9–10.3)
Chloride: 102 mmol/L (ref 101–111)
Creatinine, Ser: 1.77 mg/dL — ABNORMAL HIGH (ref 0.61–1.24)
GFR calc Af Amer: 52 mL/min — ABNORMAL LOW (ref >60)
GFR calc non Af Amer: 45 mL/min — ABNORMAL LOW (ref >60)
Glucose, Bld: 299 mg/dL — ABNORMAL HIGH (ref 65–99)
Potassium: 4.8 mmol/L (ref 3.5–5.1)
Sodium: 134 mmol/L — ABNORMAL LOW (ref 135–145)
Total Bilirubin: 0.7 mg/dL (ref 0.3–1.2)
Total Protein: 7.3 g/dL (ref 6.5–8.1)

## 2017-04-04 LAB — MAGNESIUM: Magnesium: 2.2 mg/dL (ref 1.7–2.4)

## 2017-04-04 LAB — CULTURE, BLOOD (ROUTINE X 2)
Culture: NO GROWTH
Culture: NO GROWTH
Special Requests: ADEQUATE
Special Requests: ADEQUATE

## 2017-04-04 LAB — GLUCOSE, CAPILLARY
Glucose-Capillary: 269 mg/dL — ABNORMAL HIGH (ref 65–99)
Glucose-Capillary: 292 mg/dL — ABNORMAL HIGH (ref 65–99)
Glucose-Capillary: 247 mg/dL — ABNORMAL HIGH (ref 65–99)
Glucose-Capillary: 321 mg/dL — ABNORMAL HIGH (ref 65–99)

## 2017-04-04 MED ORDER — METOPROLOL TARTRATE 50 MG PO TABS
50.0000 mg | ORAL_TABLET | Freq: Two times a day (BID) | ORAL | Status: DC
  Administered 2017-04-04 – 2017-04-05 (×3): 50 mg via ORAL
  Filled 2017-04-04 (×3): qty 1

## 2017-04-04 MED ORDER — DEXTROSE 5 % IV SOLN
2.0000 g | INTRAVENOUS | Status: DC
  Administered 2017-04-04 – 2017-04-07 (×4): 2 g via INTRAVENOUS
  Filled 2017-04-04 (×4): qty 2

## 2017-04-04 MED ORDER — CHLORHEXIDINE GLUCONATE 4 % EX LIQD
60.0000 mL | Freq: Once | CUTANEOUS | Status: AC
  Administered 2017-04-05: 4 via TOPICAL
  Filled 2017-04-04 (×2): qty 60

## 2017-04-04 MED ORDER — ENOXAPARIN SODIUM 120 MG/0.8ML ~~LOC~~ SOLN
110.0000 mg | Freq: Two times a day (BID) | SUBCUTANEOUS | Status: AC
  Administered 2017-04-04 (×2): 110 mg via SUBCUTANEOUS
  Filled 2017-04-04 (×3): qty 0.73

## 2017-04-04 MED ORDER — INSULIN ASPART 100 UNIT/ML ~~LOC~~ SOLN
6.0000 [IU] | Freq: Once | SUBCUTANEOUS | Status: AC
Start: 2017-04-04 — End: 2017-04-04
  Administered 2017-04-04: 6 [IU] via SUBCUTANEOUS

## 2017-04-04 MED ORDER — CLINDAMYCIN HCL 300 MG PO CAPS
600.0000 mg | ORAL_CAPSULE | Freq: Three times a day (TID) | ORAL | Status: AC
  Administered 2017-04-04 – 2017-04-05 (×3): 600 mg via ORAL
  Filled 2017-04-04 (×3): qty 2

## 2017-04-04 NOTE — Progress Notes (Signed)
Patient ID: [name], male   DOB: October 03, 1970, 45 y.o.   MRN: [id number] Postoperative day 1 debridement of necrotizing fasciitisright calf. Patient had massive abscess and necrotic fascia. We will plan for return to the operating room on Friday tomorrow for further debridement. Instillation wound VAC is functioning well.

## 2017-04-04 NOTE — Progress Notes (Signed)
Pharmacy Antibiotic Note  [name] is a 46 y.o. male admitted on 03/30/2017 with a RLE wound concerning for necrotizing fasciitis. Pharmacy has been consulted for Vancomycin / Zosyn dosing.  The patient's dose remains stable for the patient's weight and renal function. SCr 1.77, nCrCl~50-55 ml/min. Noticed deep/surgical wound cultures still pending results which may help guide antibiotic de-escalation plans.   Plan: 1. Continue Vancomycin to 1250 mg IV every 24 hours 2. Continue Zosyn 3.375g IV every 8 hours (infused over 4 hours) 3. Will continue to follow renal function, culture results, LOT, and antibiotic de-escalation plans   Height: 5' 11.5" (181.6 cm) Weight: 245 lb 6 oz (111.3 kg) IBW/kg (Calculated) : 76.45  Temp (24hrs), Avg:98.7 F (37.1 C), Min:98 F (36.7 C), Max:99.3 F (37.4 C)   Recent Labs Lab 03/30/17 1920 03/30/17 2118 03/30/17 2317 03/31/17 0320 04/01/17 0254 04/02/17 0713 04/03/17 0332 04/04/17 0236  WBC  --   --   --  14.7* 15.2* 15.8* 13.9* 12.2*  CREATININE  --   --   --  1.43* 1.90* 2.12* 1.77* 1.77*  LATICACIDVEN 2.29* 2.87* 1.66  --   --   --   --   --     Estimated Creatinine Clearance: 67.4 mL/min (A) (by C-G formula based on SCr of 1.77 mg/dL (H)).    No Known Allergies  Vancomycin 9/15 >> Zosyn 9/15 >>  9/15 RLE Wound cx (superficial) >> E. Coli 9/15 BCx >> ngtd 9/19 RLE WCx (surgical/deep) >> GPC in clusters + GNR  Thank you for allowing pharmacy to be a part of this patient's care.  Alycia Rossetti, PharmD, BCPS Clinical Pharmacist Pager: [phone] Clinical phone for 04/04/2017 from 7a-3:30p: [phone] If after 3:30p, please call main pharmacy at: [phone] 04/04/2017 1:02 PM

## 2017-04-04 NOTE — Progress Notes (Signed)
ANTICOAGULATION CONSULT NOTE - Initial Consult  Pharmacy Consult for Lovenox Indication: DVT  No Known Allergies  Patient Measurements: Height: 5' 11.5" (181.6 cm) Weight: 245 lb 6 oz (111.3 kg) IBW/kg (Calculated) : 76.45  Vital Signs: Temp: 98.5 F (36.9 C) (09/20 0610) Temp Source: Oral (09/20 0610) BP: 155/103 (09/20 0610) Pulse Rate: 88 (09/20 0610)  Labs:  Recent Labs  04/02/17 0713 04/03/17 0332 04/04/17 0236  HGB 12.0* 12.4* 11.9*  HCT 37.2* 38.6* 37.2*  PLT 357 378 408*  CREATININE 2.12* 1.77* 1.77*    Estimated Creatinine Clearance: 67.4 mL/min (A) (by C-G formula based on SCr of 1.77 mg/dL (H)).   Medical History: Past Medical History:  Diagnosis Date  . Diabetes mellitus without complication ([place])   . DVT of proximal lower limb ([place]) 03/20/2017   R calf area  . Gout   . Hypertension     Assessment: 40 YOM with a recent RLE DVT diagnosed on 9/5. The patient was on Xarelto PTA - this was held for plans for debridement by ortho - done 9/19 and planning again 9/21. Pharmacy has been consulted today to start full-dose Lovenox for bridging while Xarelto held.   Discussed with Dr. Grandville Silos whether Heparin might be better given the patient's plans to return to the OR on Fri, 9/21. Dr. Grandville Silos had already discussed with Dr. Sharol Given who preferred Lovenox. Will continue with Lovenox - and will give 2 doses for today and will f/u in the AM plans for OR and whether the AM dose should be given or held pre-procedure.   Goal of Therapy:   Anti-Xa level 0.6-1 units/ml 4hrs after LMWH dose given Monitor platelets by anticoagulation protocol: Yes   Plan:  1. Lovenox 110 mg SQ every 12 hours x 2 doses today 2. Will hold additional doses for now and follow-up in the AM for plans for OR  Thank you for allowing pharmacy to be a part of this patient's care.  Alycia Rossetti, PharmD, BCPS Clinical Pharmacist Pager: [phone] Clinical phone for 04/04/2017 from 7a-3:30p:  [phone] If after 3:30p, please call main pharmacy at: [phone] 04/04/2017 1:02 PM

## 2017-04-04 NOTE — Consult Note (Signed)
[facility] for Infectious Disease    Date of Admission:  03/30/2017   Total days of antibiotics 5        Day 6 of Vancomycin (03/30/17 - )        Day 6 of Pip-Tazo (03/30/17 - )       Reason for Consult: Right leg wound infection    Referring Provider: [facility] Primary Care Provider: Ginette Pitman  Assessment: Right leg wound infection s/p debridement 04/03/17, with 12 x 17 cm area of necrotizing fasciitis Wound cultures 03/30/17 + for E. coli T2DM with neuropathy (Hgb A1c 15.5) AKI Recent DVT of RLE (03/20/17)  Plan: 1. Continue IV vanc 2. Change zosyn to clinda/ceftriaxone to cover e coli and provide "eagle effect" for short term.  3. F/u deep tissue culture collected 04/03/17. 4. Further debridement with Dr. Sharol Given planned for 04/04/17. 5. Trend Cr and try to better understand his baseline.  Principal Problem:   Sepsis ([place]) Active Problems:   HTN (hypertension)   Hyperglycemia due to type 2 diabetes mellitus ([place])   Acute kidney injury superimposed on chronic kidney disease (HCC)   Hypoalbuminemia   Right leg DVT (HCC)   Diabetic ulcer of calf ([place])   Noncompliance w/medication treatment due to intermit use of medication   Cellulitis of right lower leg   Sinus tachycardia   Fever   Chronic anticoagulation   Necrotizing fasciitis ([place])   Cellulitis of right lower extremity   Diabetic ulcer of ankle ([place])   . amLODipine  10 mg Oral Daily  . docusate sodium  100 mg Oral BID  . enoxaparin (LOVENOX) injection  110 mg Subcutaneous Q12H  . feeding supplement (PRO-STAT SUGAR FREE 64)  30 mL Oral BID  . insulin aspart  0-15 Units Subcutaneous TID WC  . insulin aspart  8 Units Subcutaneous TID WC  . insulin glargine  25 Units Subcutaneous Daily  . living well with diabetes book   Does not apply Once  . metoprolol tartrate  50 mg Oral BID  . polyethylene glycol  17 g Oral Daily    HPI: [name] is a 46 y.o. male with history of T2DM with neuropathy  (Hgb A1c 15.5, ran out of insulin since June 2017), recent DVT of RLE (03/20/17), and CKD who was admitted 03/30/17 for right lower extremity wound and fever. He explains that he bumped his right leg on a chair approximately 3 months ago and sustained a small abrasion, which over the past few weeks increased in size and began to express purulent drainage. He has had diabetes for approximately 10 years, and he has not taken any medications for his diabetes since June 2017. He does describe an prior incident of a "scrotal infection requiring emergency surgery" which sounds like Fournier's gangrene back in 2016.   Upon presentation, [name] was found to be febrile with a necrotic black eschar over the lateral aspect of his right lower leg approximately 10cm in diameter with foul-smelling drainage. WBC count was 13.6. He was started on vanc + zosyn. Blood cultures drawn 03/30/17 negative to date. Wound culture 03/30/17 grew E. Coli (suceptibilities as below). He underwent debridement 04/03/17 with Dr. Sharol Given with intraoperative findings of massive amounts of purulence and 12 x 17 cm area of necrotizing fasciitis. Wound vac was placed and deep tissue culture was sent, which thus far is growing gram positive cocci in clusters and rare gram negative rods.  Currently, [name] is  feeling fairly well. He continues to have some pain in the right lower extremity. He denies fevers/chills. WBC count has decreased to 12.2 (after initial increase to 15.8).  Review of Systems: Review of Systems  Constitutional: Negative for chills, fever and malaise/fatigue.  HENT: Negative for congestion and sore throat.   Eyes: Negative for blurred vision.  Respiratory: Negative for cough and shortness of breath.   Cardiovascular: Positive for leg swelling. Negative for chest pain and palpitations.  Gastrointestinal: Negative for abdominal pain, constipation, diarrhea, nausea and vomiting.  Genitourinary: Negative for  dysuria.       Polyuria  Musculoskeletal: Negative for joint pain and myalgias.  Skin: Negative for itching and rash.  Neurological: Negative for headaches.  Endo/Heme/Allergies: Positive for polydipsia.    Past Medical History:  Diagnosis Date  . Diabetes mellitus without complication ([place])   . DVT of proximal lower limb ([place]) 03/20/2017   R calf area  . Gout   . Hypertension     Social History  Substance Use Topics  . Smoking status: Never Smoker  . Smokeless tobacco: Never Used  . Alcohol use No    Family History  Problem Relation Age of Onset  . Hypertension Mother   . Stroke Mother   . Diabetes Father   . Renal Disease Father   . Hypertension Sister   . Diabetes Sister   . Renal Disease Sister   . Iron deficiency Sister    No Known Allergies  OBJECTIVE: Blood pressure (!) 138/107, pulse 94, temperature 98 F (36.7 C), temperature source Oral, resp. rate 18, height 5' 11.5" (1.816 m), weight 111.3 kg (245 lb 6 oz), SpO2 97 %.  Physical Exam  General: Comfortable-appearing male resting in bed. HEENT: Moist mucus membranes. Cardiovascular: Tachycardic. Regular rhythm. No murmurs appreciated. Respiratory: Normal work of breathing on room air. Clear to auscultation bilaterally. Abdomen: Soft, non-tender, non-distended. +BS. Extremities: Wound vac in place over right lateral lower leg. Tenderness to palpation of skin surrounding wound vac. Neurologic: Alert and oriented.  Lab Results Lab Results  Component Value Date   WBC 12.2 (H) 04/04/2017   HGB 11.9 (L) 04/04/2017   HCT 37.2 (L) 04/04/2017   MCV 88.6 04/04/2017   PLT 408 (H) 04/04/2017    Lab Results  Component Value Date   CREATININE 1.77 (H) 04/04/2017   BUN 21 (H) 04/04/2017   NA 134 (L) 04/04/2017   K 4.8 04/04/2017   CL 102 04/04/2017   CO2 22 04/04/2017    Lab Results  Component Value Date   ALT 14 (L) 04/04/2017   AST 18 04/04/2017   ALKPHOS 72 04/04/2017   BILITOT 0.7 04/04/2017       Microbiology: Recent Results (from the past 240 hour(s))  Blood culture (routine x 2)     Status: None (Preliminary result)   Collection Time: 03/30/17  7:02 PM  Result Value Ref Range Status   Specimen Description BLOOD LEFT FOREARM  Final   Special Requests   Final    BOTTLES DRAWN AEROBIC AND ANAEROBIC Blood Culture adequate volume   Culture NO GROWTH 4 DAYS  Final   Report Status PENDING  Incomplete  Blood culture (routine x 2)     Status: None (Preliminary result)   Collection Time: 03/30/17  7:43 PM  Result Value Ref Range Status   Specimen Description BLOOD RIGHT ANTECUBITAL  Final   Special Requests   Final    BOTTLES DRAWN AEROBIC AND ANAEROBIC Blood Culture adequate volume  Culture NO GROWTH 4 DAYS  Final   Report Status PENDING  Incomplete  Aerobic Culture (superficial specimen)     Status: None   Collection Time: 03/30/17 11:23 PM  Result Value Ref Range Status   Specimen Description WOUND RIGHT LEG  Final   Special Requests Normal  Final   Gram Stain   Final    RARE WBC PRESENT, PREDOMINANTLY PMN RARE GRAM POSITIVE COCCI IN PAIRS IN CLUSTERS RARE GRAM NEGATIVE RODS    Culture ABUNDANT ESCHERICHIA COLI  Final   Report Status 04/02/2017 FINAL  Final   Organism ID, Bacteria ESCHERICHIA COLI  Final      Susceptibility   Escherichia coli - MIC*    AMPICILLIN >=32 RESISTANT Resistant     CEFAZOLIN 16 SENSITIVE Sensitive     CEFEPIME <=1 SENSITIVE Sensitive     CEFTAZIDIME <=1 SENSITIVE Sensitive     CEFTRIAXONE <=1 SENSITIVE Sensitive     CIPROFLOXACIN <=0.25 SENSITIVE Sensitive     GENTAMICIN <=1 SENSITIVE Sensitive     IMIPENEM <=0.25 SENSITIVE Sensitive     TRIMETH/SULFA >=320 RESISTANT Resistant     AMPICILLIN/SULBACTAM >=32 RESISTANT Resistant     PIP/TAZO <=4 SENSITIVE Sensitive     Extended ESBL NEGATIVE Sensitive     * ABUNDANT ESCHERICHIA COLI  Aerobic/Anaerobic Culture (surgical/deep wound)     Status: None (Preliminary result)   Collection Time:  04/03/17  4:49 PM  Result Value Ref Range Status   Specimen Description ABSCESS  Final   Special Requests RT LATERAL CALF  Final   Gram Stain   Final    ABUNDANT WBC PRESENT, PREDOMINANTLY PMN FEW GRAM POSITIVE COCCI IN CLUSTERS RARE GRAM NEGATIVE RODS Gram Stain Report Called to,Read Back By and Verified With: Dewain Penning RN [phone] 04/03/17 A BROWNING    Culture CULTURE REINCUBATED FOR BETTER GROWTH  Final   Report Status PENDING  Incomplete    Madelynn Done, [facility] for [facility] [phone] pager   [phone] cell 04/04/2017, 12:47 PM

## 2017-04-04 NOTE — Anesthesia Postprocedure Evaluation (Signed)
Anesthesia Post Note  Patient: [name]  Procedure(s) Performed: Procedure(s) (LRB): IRRIGATION AND DEBRIDEMENT RIGHT LEG, APPLY WOUND VAC (Right)     Patient location during evaluation: PACU Anesthesia Type: General Level of consciousness: awake Pain management: pain level controlled Vital Signs Assessment: post-procedure vital signs reviewed and stable Respiratory status: spontaneous breathing Cardiovascular status: stable Anesthetic complications: no    Last Vitals:  Vitals:   04/04/17 0610 04/04/17 0900  BP: (!) 155/103 (!) 138/107  Pulse: 88 94  Resp: 19 18  Temp: 36.9 C 36.7 C  SpO2: 96% 97%    Last Pain:  Vitals:   04/04/17 1230  TempSrc:   PainSc: 4                  [name]

## 2017-04-04 NOTE — Progress Notes (Signed)
PROGRESS NOTE    [name]  ZOX:[id number] DOB: 04/02/1971 DOA: 03/30/2017 PCP: Tracie Harrier, MD    Brief Narrative:  46 y/o male with poorly controlled insulin requiring diabetes, HTN, gout, CKD, recently diagnosed DVT RLE on xarelto who presented to ED with fever and poorly healing right calf leg wound.     Assessment & Plan:   Principal Problem:   Sepsis ([place]) Active Problems:   HTN (hypertension)   Hyperglycemia due to type 2 diabetes mellitus ([place])   Acute kidney injury superimposed on chronic kidney disease (HCC)   Hypoalbuminemia   Right leg DVT (HCC)   Diabetic ulcer of calf ([place])   Noncompliance w/medication treatment due to intermit use of medication   Cellulitis of right lower leg   Sinus tachycardia   Fever   Chronic anticoagulation   Necrotizing fasciitis (HCC)   Cellulitis of right lower extremity  1. Sepsis - secondary to cellulitis and infection of right leg calf wound - fever curve trended down. Leukocytosis fluctuating. Lactic acid trended down. Patient status post wound debridement and debridement of massive deep wound abscess 04/03/2017 with cultures pending with preliminary results of gram-positive cocci in clusters and gram-negative rods. Wound cultures positive for abundant Escherichia coli.  Continue empiric IV vancomycin and IV Zosyn. Patient for further debridement tomorrow 04/05/2017. Consult with ID for antibiotic recommendations duration and management. Per orthopedics.  2. Right leg wound -  orthopedics s/p wound debridement 04/03/2017 with debridement of deep wound abscess with cultures pending. Preliminary cultures with gram-positive cocci in clusters and gram-negative rods. Wound cultures are positive for abundant Escherichia coli. Continue empiric IV vancomycin and IV Zosyn. Patient per orthopedic with massive abscess and necrotic fascia. Patient to return to the operating room tomorrow for further debridement per orthopedics.  Consult  with ID for antibiotic recommendations duration and management.    3. Poorly controlledType 2 diabetes mellitus, insulin requiring with neurological complications - Pt reports that he has not had insulin to take in over 1 year.  He recently established with a PCP and is willing to take insulin again. Hemoglobin A1c greater than 15.5.  Basal lantus insulin ordered. TSH normal.  CBG 158 - 269. Patient currently on Lantus 25 units daily, meal coverage insulin 8 units 3 times daily as well as sliding scale insulin. Will keep patient on Lantus for now as patient is to go back to the operating room tomorrow per orthopedics.Postoperatively will transition to 70/30 regimen and titrate as needed as patient will likely be discharged home on this. Patient states he's on 30 units of 70/30 in the morning and 18 units at bedtime.  4. Acute Kidney Injury on CKD (suspected) - suspect he has CKD from uncontrolled hypertension and diabetes mellitus.  IV fluids ordered for hydration, avoid nephrotoxins, follow renal function panel. Renal function trending down. 5. Essential hypertension - poorly controlled, increased amlodopine to 10 mg daily. Increase metoprolol to 50 mg twice daily. 6. DVT of RLE - (acute) He was on xarelto and saw hematology recently and they are suggesting he be on anticoagulation indefinitely given his risk factors for DVT.  He will follow up in 6 months with hematology to discuss further. Patient to go back to the OR tomorrow and a such Xarelto still on hold. Will place on full dose Lovenox for now while patient is going back and forth to the operating room. Postop orthopedics to advise when anticoagulation may be resumed.   7. Malnutrition of chronic disease - dietitian  consultion pending.   8. Hypokalemia--repleted. Keep magnesium greater than 2.    DVT prophylaxis: Lovenox Code Status: Full Family Communication: Updated patient. No family at bedside. Disposition Plan: Home versus skilled nursing  facility per orthopedics.   Consultants:   Orthopedics: Dr. Sharol Given 04/01/2017    Procedure  Chest x-ray 04/01/2017    Plain films of the right tib-fib 03/30/2017  ABIs 04/03/2017  Irrigation and debridement RLE, apply wound vac. Debridement of deep abscess. Per Dr. Sharol Given 04/03/2017  Antimicrobials:   IV Zosyn 03/30/2017  IV vancomycin 03/30/2017    Subjective: Patient complaining of pain in his right heel. Denies any shortness of breath. Denies any chest pain. Patient does state had been out of his medications for over a year.  Objective: Vitals:   04/03/17 1822 04/03/17 2100 04/04/17 0610 04/04/17 0900  BP: (!) 175/101 (!) 168/106 (!) 155/103 (!) 138/107  Pulse:  96 88 94  Resp: 16 20 19 18   Temp: 99 F (37.2 C) 98.7 F (37.1 C) 98.5 F (36.9 C) 98 F (36.7 C)  TempSrc: Oral Oral Oral Oral  SpO2: 95% 97% 96% 97%  Weight:  111.3 kg (245 lb 6 oz)    Height:        Intake/Output Summary (Last 24 hours) at 04/04/17 1043 Last data filed at 04/04/17 1016  Gross per 24 hour  Intake             2901 ml  Output             1585 ml  Net             1316 ml   Filed Weights   04/01/17 2138 04/02/17 2130 04/03/17 2100  Weight: 110.5 kg (243 lb 8 oz) 111.2 kg (245 lb 2.4 oz) 111.3 kg (245 lb 6 oz)    Examination:  General exam: Appears calm and comfortable  Respiratory system: Clear to auscultation anterior lung fields. No wheezing, no crackles, no rhonchi. Respiratory effort normal. Cardiovascular system: S1 & S2 heard, RRR. No JVD, murmurs, rubs, gallops or clicks. No pedal edema. Gastrointestinal system: Abdomen is nondistended, soft and nontender. No organomegaly or masses felt. Normal bowel sounds heard. Central nervous system: Alert and oriented. No focal neurological deficits. Extremities: Right lower extremity with wound VAC on. Skin: No rashes, lesions or ulcers Psychiatry: Judgement and insight appear normal. Mood & affect appropriate.     Data  Reviewed: I have personally reviewed following labs and imaging studies  CBC:  Recent Labs Lab 03/30/17 1859 03/31/17 0320 04/01/17 0254 04/02/17 0713 04/03/17 0332 04/04/17 0236  WBC 13.6* 14.7* 15.2* 15.8* 13.9* 12.2*  NEUTROABS 10.2*  --  11.2* 12.2* 10.6* 11.0*  HGB 13.5 12.0* 11.3* 12.0* 12.4* 11.9*  HCT 40.8 36.4* 35.2* 37.2* 38.6* 37.2*  MCV 86.3 86.9 89.1 88.8 88.1 88.6  PLT 352 322 320 357 378 622*   Basic Metabolic Panel:  Recent Labs Lab 03/31/17 0320 04/01/17 0254 04/02/17 0713 04/03/17 0322 04/03/17 0332 04/04/17 0236  NA 133* 137 137  --  137 134*  K 4.5 4.0 3.7  --  3.2* 4.8  CL 102 104 104  --  102 102  CO2 23 25 25   --  27 22  GLUCOSE 296* 177* 195*  --  176* 299*  BUN 16 12 14   --  13 21*  CREATININE 1.43* 1.90* 2.12*  --  1.77* 1.77*  CALCIUM 8.0* 8.2* 8.3*  --  8.3* 8.3*  MG  --   --   --  1.6*  --  2.2   GFR: Estimated Creatinine Clearance: 67.4 mL/min (A) (by C-G formula based on SCr of 1.77 mg/dL (H)). Liver Function Tests:  Recent Labs Lab 03/30/17 1859 04/01/17 0254 04/02/17 0713 04/03/17 0332 04/04/17 0236  AST 25 11* 11* 14* 18  ALT 23 14* 13* 11* 14*  ALKPHOS 107 66 72 69 72  BILITOT 0.7 0.8 0.8 0.7 0.7  PROT 7.9 6.3* 6.7 6.9 7.3  ALBUMIN 2.4* 1.7* 1.7* 1.7* 1.6*   No results for input(s): LIPASE, AMYLASE in the last 168 hours. No results for input(s): AMMONIA in the last 168 hours. Coagulation Profile: No results for input(s): INR, PROTIME in the last 168 hours. Cardiac Enzymes: No results for input(s): CKTOTAL, CKMB, CKMBINDEX, TROPONINI in the last 168 hours. BNP (last 3 results) No results for input(s): PROBNP in the last 8760 hours. HbA1C: No results for input(s): HGBA1C in the last 72 hours. CBG:  Recent Labs Lab 04/03/17 1342 04/03/17 1621 04/03/17 1721 04/03/17 2058 04/04/17 0811  GLUCAP 178* 158* 169* 252* 269*   Lipid Profile: No results for input(s): CHOL, HDL, LDLCALC, TRIG, CHOLHDL, LDLDIRECT in  the last 72 hours. Thyroid Function Tests: No results for input(s): TSH, T4TOTAL, FREET4, T3FREE, THYROIDAB in the last 72 hours. Anemia Panel: No results for input(s): VITAMINB12, FOLATE, FERRITIN, TIBC, IRON, RETICCTPCT in the last 72 hours. Sepsis Labs:  Recent Labs Lab 03/30/17 1920 03/30/17 2118 03/30/17 2317  LATICACIDVEN 2.29* 2.87* 1.66    Recent Results (from the past 240 hour(s))  Blood culture (routine x 2)     Status: None (Preliminary result)   Collection Time: 03/30/17  7:02 PM  Result Value Ref Range Status   Specimen Description BLOOD LEFT FOREARM  Final   Special Requests   Final    BOTTLES DRAWN AEROBIC AND ANAEROBIC Blood Culture adequate volume   Culture NO GROWTH 4 DAYS  Final   Report Status PENDING  Incomplete  Blood culture (routine x 2)     Status: None (Preliminary result)   Collection Time: 03/30/17  7:43 PM  Result Value Ref Range Status   Specimen Description BLOOD RIGHT ANTECUBITAL  Final   Special Requests   Final    BOTTLES DRAWN AEROBIC AND ANAEROBIC Blood Culture adequate volume   Culture NO GROWTH 4 DAYS  Final   Report Status PENDING  Incomplete  Aerobic Culture (superficial specimen)     Status: None   Collection Time: 03/30/17 11:23 PM  Result Value Ref Range Status   Specimen Description WOUND RIGHT LEG  Final   Special Requests Normal  Final   Gram Stain   Final    RARE WBC PRESENT, PREDOMINANTLY PMN RARE GRAM POSITIVE COCCI IN PAIRS IN CLUSTERS RARE GRAM NEGATIVE RODS    Culture ABUNDANT ESCHERICHIA COLI  Final   Report Status 04/02/2017 FINAL  Final   Organism ID, Bacteria ESCHERICHIA COLI  Final      Susceptibility   Escherichia coli - MIC*    AMPICILLIN >=32 RESISTANT Resistant     CEFAZOLIN 16 SENSITIVE Sensitive     CEFEPIME <=1 SENSITIVE Sensitive     CEFTAZIDIME <=1 SENSITIVE Sensitive     CEFTRIAXONE <=1 SENSITIVE Sensitive     CIPROFLOXACIN <=0.25 SENSITIVE Sensitive     GENTAMICIN <=1 SENSITIVE Sensitive      IMIPENEM <=0.25 SENSITIVE Sensitive     TRIMETH/SULFA >=320 RESISTANT Resistant     AMPICILLIN/SULBACTAM >=32 RESISTANT Resistant     PIP/TAZO <=4 SENSITIVE Sensitive  Extended ESBL NEGATIVE Sensitive     * ABUNDANT ESCHERICHIA COLI  Aerobic/Anaerobic Culture (surgical/deep wound)     Status: None (Preliminary result)   Collection Time: 04/03/17  4:49 PM  Result Value Ref Range Status   Specimen Description ABSCESS  Final   Special Requests RT LATERAL CALF  Final   Gram Stain   Final    ABUNDANT WBC PRESENT, PREDOMINANTLY PMN FEW GRAM POSITIVE COCCI IN CLUSTERS RARE GRAM NEGATIVE RODS Gram Stain Report Called to,Read Back By and Verified WithDewain Penning RN [phone] 04/03/17 A BROWNING    Culture PENDING  Incomplete   Report Status PENDING  Incomplete         Radiology Studies: No results found.      Scheduled Meds: . amLODipine  10 mg Oral Daily  . docusate sodium  100 mg Oral BID  . enoxaparin (LOVENOX) injection  110 mg Subcutaneous Q12H  . feeding supplement (PRO-STAT SUGAR FREE 64)  30 mL Oral BID  . insulin aspart  0-15 Units Subcutaneous TID WC  . insulin aspart  8 Units Subcutaneous TID WC  . insulin glargine  25 Units Subcutaneous Daily  . living well with diabetes book   Does not apply Once  . metoprolol tartrate  50 mg Oral BID  . polyethylene glycol  17 g Oral Daily   Continuous Infusions: . sodium chloride    . lactated ringers 50 mL/hr at 04/03/17 1616  . methocarbamol (ROBAXIN)  IV    . piperacillin-tazobactam (ZOSYN)  IV 3.375 g (04/04/17 0916)  . vancomycin Stopped (04/03/17 2323)     LOS: 5 days    Time spent: 49 mins    THOMPSON,DANIEL, MD [facility] Pager [phone]  If 7PM-7AM, please contact night-coverage [email or web address] Password [facility] 04/04/2017, 10:43 AM

## 2017-04-04 NOTE — Progress Notes (Signed)
[facility] Infusion Coordinator will follow pt with ID team for possible home IV ABX/HH needs at DC.   If patient discharges after hours, please call [phone].   [name] 04/04/2017, 1:01 PM

## 2017-04-05 ENCOUNTER — Inpatient Hospital Stay (HOSPITAL_COMMUNITY): Payer: BLUE CROSS/BLUE SHIELD

## 2017-04-05 ENCOUNTER — Inpatient Hospital Stay (HOSPITAL_COMMUNITY): Payer: BLUE CROSS/BLUE SHIELD | Admitting: Certified Registered Nurse Anesthetist

## 2017-04-05 ENCOUNTER — Encounter (HOSPITAL_COMMUNITY): Payer: Self-pay | Admitting: *Deleted

## 2017-04-05 ENCOUNTER — Other Ambulatory Visit (HOSPITAL_COMMUNITY): Payer: BLUE CROSS/BLUE SHIELD

## 2017-04-05 ENCOUNTER — Encounter (HOSPITAL_COMMUNITY)
Admission: EM | Disposition: A | Discharge status: Skilled Nursing Facility | Payer: Self-pay | Source: Home / Self Care | Attending: Internal Medicine

## 2017-04-05 DIAGNOSIS — E1165 Type 2 diabetes mellitus with hyperglycemia: Secondary | ICD-10-CM

## 2017-04-05 HISTORY — PX: I&D EXTREMITY: SHX5045

## 2017-04-05 LAB — COMPREHENSIVE METABOLIC PANEL
ALT: 14 U/L — ABNORMAL LOW (ref 17–63)
AST: 14 U/L — ABNORMAL LOW (ref 15–41)
Albumin: 1.7 g/dL — ABNORMAL LOW (ref 3.5–5.0)
Alkaline Phosphatase: 64 U/L (ref 38–126)
Anion gap: 7 (ref 5–15)
BUN: 31 mg/dL — ABNORMAL HIGH (ref 6–20)
CO2: 25 mmol/L (ref 22–32)
Calcium: 8.4 mg/dL — ABNORMAL LOW (ref 8.9–10.3)
Chloride: 101 mmol/L (ref 101–111)
Creatinine, Ser: 1.75 mg/dL — ABNORMAL HIGH (ref 0.61–1.24)
GFR calc Af Amer: 53 mL/min — ABNORMAL LOW (ref >60)
GFR calc non Af Amer: 45 mL/min — ABNORMAL LOW (ref >60)
Glucose, Bld: 291 mg/dL — ABNORMAL HIGH (ref 65–99)
Potassium: 4.3 mmol/L (ref 3.5–5.1)
Sodium: 133 mmol/L — ABNORMAL LOW (ref 135–145)
Total Bilirubin: 0.4 mg/dL (ref 0.3–1.2)
Total Protein: 6.7 g/dL (ref 6.5–8.1)

## 2017-04-05 LAB — CBC WITH DIFFERENTIAL/PLATELET
Basophils Absolute: 0 10*3/uL (ref 0.0–0.1)
Basophils Relative: 0 %
Eosinophils Absolute: 0.1 10*3/uL (ref 0.0–0.7)
Eosinophils Relative: 1 %
HCT: 35.4 % — ABNORMAL LOW (ref 39.0–52.0)
Hemoglobin: 11.5 g/dL — ABNORMAL LOW (ref 13.0–17.0)
Lymphocytes Relative: 21 %
Lymphs Abs: 2.6 10*3/uL (ref 0.7–4.0)
MCH: 28.4 pg (ref 26.0–34.0)
MCHC: 32.5 g/dL (ref 30.0–36.0)
MCV: 87.4 fL (ref 78.0–100.0)
Monocytes Absolute: 1.3 10*3/uL — ABNORMAL HIGH (ref 0.1–1.0)
Monocytes Relative: 11 %
Neutro Abs: 8.2 10*3/uL — ABNORMAL HIGH (ref 1.7–7.7)
Neutrophils Relative %: 67 %
Platelets: 474 10*3/uL — ABNORMAL HIGH (ref 150–400)
RBC: 4.05 MIL/uL — ABNORMAL LOW (ref 4.22–5.81)
RDW: 12.4 % (ref 11.5–15.5)
WBC: 12.2 10*3/uL — ABNORMAL HIGH (ref 4.0–10.5)

## 2017-04-05 LAB — GLUCOSE, CAPILLARY
Glucose-Capillary: 258 mg/dL — ABNORMAL HIGH (ref 65–99)
Glucose-Capillary: 236 mg/dL — ABNORMAL HIGH (ref 65–99)
Glucose-Capillary: 244 mg/dL — ABNORMAL HIGH (ref 65–99)
Glucose-Capillary: 223 mg/dL — ABNORMAL HIGH (ref 65–99)
Glucose-Capillary: 202 mg/dL — ABNORMAL HIGH (ref 65–99)
Glucose-Capillary: 273 mg/dL — ABNORMAL HIGH (ref 65–99)

## 2017-04-05 SURGERY — IRRIGATION AND DEBRIDEMENT EXTREMITY
Anesthesia: General | Laterality: Right

## 2017-04-05 MED ORDER — INSULIN ASPART 100 UNIT/ML ~~LOC~~ SOLN
0.0000 [IU] | Freq: Every day | SUBCUTANEOUS | Status: DC
  Administered 2017-04-05: 3 [IU] via SUBCUTANEOUS

## 2017-04-05 MED ORDER — METOCLOPRAMIDE HCL 5 MG/ML IJ SOLN: 5.0000 mg | Freq: Three times a day (TID) | INTRAMUSCULAR | Status: DC | PRN

## 2017-04-05 MED ORDER — HYDROMORPHONE HCL 1 MG/ML IJ SOLN: 0.2500 mg | INTRAMUSCULAR | Status: DC | PRN

## 2017-04-05 MED ORDER — FENTANYL CITRATE (PF) 250 MCG/5ML IJ SOLN
INTRAMUSCULAR | Status: AC
  Filled 2017-04-05: qty 5

## 2017-04-05 MED ORDER — MIDAZOLAM HCL 2 MG/2ML IJ SOLN
INTRAMUSCULAR | Status: DC | PRN
  Administered 2017-04-05: 1 mg via INTRAVENOUS

## 2017-04-05 MED ORDER — ACETAMINOPHEN 650 MG RE SUPP: 650.0000 mg | Freq: Four times a day (QID) | RECTAL | Status: DC | PRN

## 2017-04-05 MED ORDER — POLYETHYLENE GLYCOL 3350 17 G PO PACK: 17.0000 g | PACK | Freq: Every day | ORAL | Status: DC | PRN

## 2017-04-05 MED ORDER — PHENYLEPHRINE 40 MCG/ML (10ML) SYRINGE FOR IV PUSH (FOR BLOOD PRESSURE SUPPORT)
PREFILLED_SYRINGE | INTRAVENOUS | Status: AC
  Filled 2017-04-05: qty 10

## 2017-04-05 MED ORDER — MIDAZOLAM HCL 2 MG/2ML IJ SOLN
INTRAMUSCULAR | Status: AC
  Filled 2017-04-05: qty 2

## 2017-04-05 MED ORDER — PROMETHAZINE HCL 25 MG/ML IJ SOLN: 6.2500 mg | INTRAMUSCULAR | Status: DC | PRN

## 2017-04-05 MED ORDER — METOPROLOL TARTRATE 25 MG PO TABS
75.0000 mg | ORAL_TABLET | Freq: Two times a day (BID) | ORAL | Status: DC
  Administered 2017-04-05 – 2017-04-07 (×5): 75 mg via ORAL
  Filled 2017-04-05 (×5): qty 1

## 2017-04-05 MED ORDER — LIDOCAINE HCL (CARDIAC) 20 MG/ML IV SOLN
INTRAVENOUS | Status: DC | PRN
Start: 2017-04-05 — End: 2017-04-05
  Administered 2017-04-05: 60 mg via INTRAVENOUS

## 2017-04-05 MED ORDER — BISACODYL 10 MG RE SUPP: 10.0000 mg | Freq: Every day | RECTAL | Status: DC | PRN

## 2017-04-05 MED ORDER — OXYCODONE HCL 5 MG PO TABS: 5.0000 mg | ORAL_TABLET | Freq: Once | ORAL | Status: DC | PRN

## 2017-04-05 MED ORDER — ONDANSETRON HCL 4 MG/2ML IJ SOLN
4.0000 mg | Freq: Four times a day (QID) | INTRAMUSCULAR | Status: DC | PRN
  Administered 2017-04-10: 4 mg via INTRAVENOUS

## 2017-04-05 MED ORDER — PROPOFOL 10 MG/ML IV BOLUS
INTRAVENOUS | Status: DC | PRN
Start: 2017-04-05 — End: 2017-04-05
  Administered 2017-04-05: 200 mg via INTRAVENOUS

## 2017-04-05 MED ORDER — FENTANYL CITRATE (PF) 100 MCG/2ML IJ SOLN
INTRAMUSCULAR | Status: DC | PRN
  Administered 2017-04-05 (×5): 50 ug via INTRAVENOUS

## 2017-04-05 MED ORDER — ACETAMINOPHEN 325 MG PO TABS: 650.0000 mg | ORAL_TABLET | Freq: Four times a day (QID) | ORAL | Status: DC | PRN

## 2017-04-05 MED ORDER — LACTATED RINGERS IV SOLN
INTRAVENOUS | Status: DC | PRN
  Administered 2017-04-05: 10:00:00 via INTRAVENOUS

## 2017-04-05 MED ORDER — MAGNESIUM CITRATE PO SOLN: 1.0000 | Freq: Once | ORAL | Status: DC | PRN

## 2017-04-05 MED ORDER — METHOCARBAMOL 500 MG PO TABS
500.0000 mg | ORAL_TABLET | Freq: Four times a day (QID) | ORAL | Status: DC | PRN
  Administered 2017-04-08 – 2017-04-10 (×3): 500 mg via ORAL
  Filled 2017-04-05 (×2): qty 1

## 2017-04-05 MED ORDER — ONDANSETRON HCL 4 MG/2ML IJ SOLN
INTRAMUSCULAR | Status: AC
  Filled 2017-04-05: qty 2

## 2017-04-05 MED ORDER — DOCUSATE SODIUM 100 MG PO CAPS
100.0000 mg | ORAL_CAPSULE | Freq: Two times a day (BID) | ORAL | Status: DC
  Administered 2017-04-05 – 2017-04-12 (×12): 100 mg via ORAL
  Filled 2017-04-05 (×12): qty 1

## 2017-04-05 MED ORDER — PROPOFOL 10 MG/ML IV BOLUS
INTRAVENOUS | Status: AC
  Filled 2017-04-05: qty 20

## 2017-04-05 MED ORDER — SENNOSIDES-DOCUSATE SODIUM 8.6-50 MG PO TABS
1.0000 | ORAL_TABLET | Freq: Two times a day (BID) | ORAL | Status: DC
  Administered 2017-04-05 – 2017-04-12 (×13): 1 via ORAL
  Filled 2017-04-05 (×13): qty 1

## 2017-04-05 MED ORDER — OXYCODONE HCL 5 MG/5ML PO SOLN: 5.0000 mg | Freq: Once | ORAL | Status: DC | PRN

## 2017-04-05 MED ORDER — INSULIN GLARGINE 100 UNIT/ML ~~LOC~~ SOLN
30.0000 [IU] | Freq: Every day | SUBCUTANEOUS | Status: DC
  Administered 2017-04-06 – 2017-04-12 (×7): 30 [IU] via SUBCUTANEOUS
  Filled 2017-04-05 (×7): qty 0.3

## 2017-04-05 MED ORDER — ONDANSETRON HCL 4 MG/2ML IJ SOLN
INTRAMUSCULAR | Status: DC | PRN
  Administered 2017-04-05: 4 mg via INTRAVENOUS

## 2017-04-05 MED ORDER — METHOCARBAMOL 1000 MG/10ML IJ SOLN
500.0000 mg | Freq: Four times a day (QID) | INTRAVENOUS | Status: DC | PRN
  Filled 2017-04-05: qty 5

## 2017-04-05 MED ORDER — MUPIROCIN 2 % EX OINT
TOPICAL_OINTMENT | CUTANEOUS | Status: AC
  Filled 2017-04-05: qty 22

## 2017-04-05 MED ORDER — ONDANSETRON HCL 4 MG PO TABS: 4.0000 mg | ORAL_TABLET | Freq: Four times a day (QID) | ORAL | Status: DC | PRN

## 2017-04-05 MED ORDER — LACTATED RINGERS IV SOLN
INTRAVENOUS | Status: DC
  Administered 2017-04-05: 11:00:00 via INTRAVENOUS

## 2017-04-05 MED ORDER — 0.9 % SODIUM CHLORIDE (POUR BTL) OPTIME
TOPICAL | Status: DC | PRN
  Administered 2017-04-05: 1000 mL

## 2017-04-05 MED ORDER — INSULIN ASPART 100 UNIT/ML ~~LOC~~ SOLN
0.0000 [IU] | Freq: Three times a day (TID) | SUBCUTANEOUS | Status: DC
  Administered 2017-04-05 – 2017-04-06 (×3): 7 [IU] via SUBCUTANEOUS
  Administered 2017-04-06 – 2017-04-07 (×2): 4 [IU] via SUBCUTANEOUS
  Administered 2017-04-07 – 2017-04-08 (×3): 3 [IU] via SUBCUTANEOUS
  Administered 2017-04-09: 4 [IU] via SUBCUTANEOUS
  Administered 2017-04-09 – 2017-04-10 (×3): 3 [IU] via SUBCUTANEOUS
  Administered 2017-04-11: 1 [IU] via SUBCUTANEOUS
  Administered 2017-04-12 (×2): 3 [IU] via SUBCUTANEOUS

## 2017-04-05 MED ORDER — HYDROMORPHONE HCL 1 MG/ML IJ SOLN
1.0000 mg | INTRAMUSCULAR | Status: DC | PRN
  Filled 2017-04-05: qty 1

## 2017-04-05 MED ORDER — OXYCODONE HCL 5 MG PO TABS
5.0000 mg | ORAL_TABLET | ORAL | Status: DC | PRN
  Administered 2017-04-06 – 2017-04-07 (×2): 10 mg via ORAL
  Filled 2017-04-05 (×2): qty 2

## 2017-04-05 MED ORDER — INSULIN ASPART 100 UNIT/ML ~~LOC~~ SOLN
10.0000 [IU] | Freq: Three times a day (TID) | SUBCUTANEOUS | Status: DC
  Administered 2017-04-06 – 2017-04-12 (×17): 10 [IU] via SUBCUTANEOUS

## 2017-04-05 MED ORDER — SODIUM CHLORIDE 0.9 % IV SOLN
INTRAVENOUS | Status: DC
  Administered 2017-04-05 – 2017-04-08 (×2): via INTRAVENOUS

## 2017-04-05 MED ORDER — METOCLOPRAMIDE HCL 5 MG PO TABS: 5.0000 mg | ORAL_TABLET | Freq: Three times a day (TID) | ORAL | Status: DC | PRN

## 2017-04-05 MED ORDER — INSULIN GLARGINE 100 UNIT/ML ~~LOC~~ SOLN
15.0000 [IU] | Freq: Once | SUBCUTANEOUS | Status: AC
  Administered 2017-04-05: 15 [IU] via SUBCUTANEOUS
  Filled 2017-04-05: qty 0.15

## 2017-04-05 SURGICAL SUPPLY — 34 items
BLADE SURG 21 STRL SS (BLADE) ×2 IMPLANT
BNDG COHESIVE 6X5 TAN STRL LF (GAUZE/BANDAGES/DRESSINGS) IMPLANT
BNDG GAUZE ELAST 4 BULKY (GAUZE/BANDAGES/DRESSINGS) ×4 IMPLANT
CASSETTE VERAFLO VERALINK (MISCELLANEOUS) ×4 IMPLANT
COVER SURGICAL LIGHT HANDLE (MISCELLANEOUS) ×4 IMPLANT
DRAPE INCISE IOBAN 66X45 STRL (DRAPES) ×4 IMPLANT
DRAPE U-SHAPE 47X51 STRL (DRAPES) ×2 IMPLANT
DRESSING VERAFLO CLEANSE CC (GAUZE/BANDAGES/DRESSINGS) ×1 IMPLANT
DRSG ADAPTIC 3X8 NADH LF (GAUZE/BANDAGES/DRESSINGS) ×2 IMPLANT
DRSG VERAFLO CLEANSE CC (GAUZE/BANDAGES/DRESSINGS) ×2
DURAPREP 26ML APPLICATOR (WOUND CARE) ×2 IMPLANT
ELECT REM PT RETURN 9FT ADLT (ELECTROSURGICAL)
ELECTRODE REM PT RTRN 9FT ADLT (ELECTROSURGICAL) IMPLANT
GAUZE SPONGE 4X4 12PLY STRL (GAUZE/BANDAGES/DRESSINGS) ×2 IMPLANT
GLOVE BIO SURGEON STRL SZ 6.5 (GLOVE) ×2 IMPLANT
GLOVE BIOGEL PI IND STRL 9 (GLOVE) ×1 IMPLANT
GLOVE BIOGEL PI INDICATOR 9 (GLOVE) ×1
GLOVE SURG ORTHO 9.0 STRL STRW (GLOVE) ×2 IMPLANT
GOWN STRL REUS W/ TWL XL LVL3 (GOWN DISPOSABLE) ×2 IMPLANT
GOWN STRL REUS W/TWL XL LVL3 (GOWN DISPOSABLE) ×2
HANDPIECE INTERPULSE COAX TIP (DISPOSABLE)
KIT BASIN OR (CUSTOM PROCEDURE TRAY) ×2 IMPLANT
KIT ROOM TURNOVER OR (KITS) ×2 IMPLANT
MANIFOLD NEPTUNE II (INSTRUMENTS) ×2 IMPLANT
NS IRRIG 1000ML POUR BTL (IV SOLUTION) ×2 IMPLANT
PACK ORTHO EXTREMITY (CUSTOM PROCEDURE TRAY) ×2 IMPLANT
PAD ARMBOARD 7.5X6 YLW CONV (MISCELLANEOUS) ×4 IMPLANT
SET HNDPC FAN SPRY TIP SCT (DISPOSABLE) IMPLANT
STOCKINETTE IMPERVIOUS 9X36 MD (GAUZE/BANDAGES/DRESSINGS) IMPLANT
SWAB COLLECTION DEVICE MRSA (MISCELLANEOUS) ×2 IMPLANT
SWAB CULTURE ESWAB REG 1ML (MISCELLANEOUS) IMPLANT
TOWEL OR 17X26 10 PK STRL BLUE (TOWEL DISPOSABLE) ×2 IMPLANT
TUBE CONNECTING 12X1/4 (SUCTIONS) ×2 IMPLANT
YANKAUER SUCT BULB TIP NO VENT (SUCTIONS) ×2 IMPLANT

## 2017-04-05 NOTE — Progress Notes (Addendum)
Pt had a burst of SVT- pt is asymptomatic. HR now at 112, a change from NSR this AM. Notified MD [place] Silos.     1830- Per MD Sharol Given: punctured wound vac dx to drain blood that was blood on the Right leg. Pt had a large clot wrapped around his ankle which was oozing and causing the leak. This RN removed the clot and redressed the right leg with ioban drapes. Per MD Sharol Given he stopped the instillation and to contact ortho if the canister fills up in 8 hours.  The canister has been removed and a new one is in place. Have passed on information to night RN.    Will continue to monitor

## 2017-04-05 NOTE — Transfer of Care (Signed)
Immediate Anesthesia Transfer of Care Note  Patient: [name]  Procedure(s) Performed: Procedure(s): IRRIGATION AND DEBRIDEMENT RIGHT LEG (Right)  Patient Location: PACU  Anesthesia Type:General  Level of Consciousness: awake, alert , oriented and patient cooperative  Airway & Oxygen Therapy: Patient Spontanous Breathing and Patient connected to nasal cannula oxygen  Post-op Assessment: Report given to RN, Post -op Vital signs reviewed and stable and Patient moving all extremities  Post vital signs: Reviewed and stable  Last Vitals:  Vitals:   04/04/17 2152 04/05/17 0954  BP: (!) 161/98 (!) 158/95  Pulse: 92 94  Resp: 18 18  Temp: (!) 36.4 C 37.3 C  SpO2: 96% 95%    Last Pain:  Vitals:   04/05/17 0954  TempSrc: Oral  PainSc:       Patients Stated Pain Goal: 2 (24/49/75 3005)  Complications: No apparent anesthesia complications

## 2017-04-05 NOTE — Progress Notes (Signed)
During rounding this RN noticed that the wound vac on the right leg was leaking. There was a pool of blood from around the right leg- wound vac seal was leaking. Notified MD Elsie Ra to reinforce the dressing.  Noted there was so blood pulling within the dressing. Will continue to monitor.   Paulla Fore, RN

## 2017-04-05 NOTE — Care Management Note (Signed)
Case Management Note  Patient Details  Name: [name] MRN: [id number] Date of Birth: 18-Jun-1971  Subjective/Objective:       CM following for progression and d/c planning.              Action/Plan: 04/05/2017 Met with pt on 04/04/17 who lives at home with his mother and sister. They are able to assist the pt if needed. Sister drives. Pt mother does meal preparation.  Will continue to follow , noted that pt returned to OR 9/21 and Instillation VAC was replaced. Will follow for d/c needs at time of d/c.   Expected Discharge Date:                  Expected Discharge Plan:  [place]  In-House Referral:  NA  Discharge planning Services  CM Consult  Post Acute Care Choice:  Home Health, Durable Medical Equipment Choice offered to:     DME Arranged:    DME Agency:     HH Arranged:    HH Agency:     Status of Service:  In process, will continue to follow  If discussed at Long Length of Stay Meetings, dates discussed:    Additional Comments:  Adron Bene, RN 04/05/2017, 3:24 PM

## 2017-04-05 NOTE — Op Note (Signed)
03/30/2017 - 04/05/2017  11:58 AM  PATIENT:  [name]    PRE-OPERATIVE DIAGNOSIS:  Necrotizing Fasciitis Right Leg  POST-OPERATIVE DIAGNOSIS:  Same  PROCEDURE:  IRRIGATION AND DEBRIDEMENT RIGHT LEG excision skin soft tissue muscle and fascia sharply with a 21 blade knife and Ronjair application of instillation wound VAC.  SURGEON:  Newt Minion, MD  PHYSICIAN ASSISTANT:None ANESTHESIA:   General  PREOPERATIVE INDICATIONS:  [name] is a  46 y.o. male with a diagnosis of Necrotizing Fasciitis Right Leg who failed conservative measures and elected for surgical management.    The risks benefits and alternatives were discussed with the patient preoperatively including but not limited to the risks of infection, bleeding, nerve injury, cardiopulmonary complications, the need for revision surgery, among others, and the patient was willing to proceed.  OPERATIVE IMPLANTS: Instillation wound VAC  OPERATIVE FINDINGS: Wound after debridement 24 x 10 cm  OPERATIVE PROCEDURE: Patient was brought to the operating room and underwent general anesthetic. After adequate levels anesthesia obtained patient's right lower extremity was prepped using Betadine paint and draped into a sterile field a timeout was called. Patient had good improvement in granulation tissue. There is still some wound necrosis. A 21 blade knife was used to debride back the wound edges farther back to healthy viable tissue. There is more ischemic necrotic changes inferiorly. After debridement there was good petechial bleeding of the entire wound bed. The wound was irrigated with pulsatile lavage. A installation wound VAC was applied this was set for 2 hours suction 26 mL of instillation and 10 minutes soaked time. Patient was extubated taken to PACU in stable condition.

## 2017-04-05 NOTE — Progress Notes (Signed)
PROGRESS NOTE    [name]  CZY:[id number] DOB: 1971/06/28 DOA: 03/30/2017 PCP: Tracie Harrier, MD    Brief Narrative:  46 y/o male with poorly controlled insulin requiring diabetes, HTN, gout, CKD, recently diagnosed DVT RLE on xarelto who presented to ED with fever and poorly healing right calf leg wound. Patient also noted to have poor control of his diabetes and hypertension. Patient seen by orthopedics, Dr. Sharol Given patient taken to the operating room for irrigation and debridement as well as wound VAC placement patient noted to have a large abscess which was cultured. A shim for repeat irrigation and debridement 04/05/2017. ID consulted for antibiotic management.     Assessment & Plan:   Principal Problem:   Sepsis ([place]) Active Problems:   HTN (hypertension)   Hyperglycemia due to type 2 diabetes mellitus ([place])   Acute kidney injury superimposed on chronic kidney disease (HCC)   Hypoalbuminemia   Right leg DVT (HCC)   Diabetic ulcer of calf ([place])   Noncompliance w/medication treatment due to intermit use of medication   Cellulitis of right lower leg   Sinus tachycardia   Fever   Chronic anticoagulation   Necrotizing fasciitis (HCC)   Cellulitis of right lower extremity   Diabetic ulcer of ankle ([place])  1. Sepsis - secondary to cellulitis and infection of right leg calf wound - fever curve trended down. Leukocytosis fluctuating. Lactic acid trended down. Patient status post wound debridement and debridement of massive deep wound abscess 04/03/2017 with cultures pending with preliminary results of gram-positive cocci in clusters and gram-negative rods. Wound cultures positive for abundant Escherichia coli. Patient for further irrigation and debridement today 04/05/2017. Patient was seen in consultation by ID and antibiotics adjusted. Continue current regimen of IV vancomycin and IV Rocephin. Patient received a dose of oral clindamycin yesterday which has been discontinued. ID  and orthopedics following.  2. Right leg wound -  orthopedics s/p wound debridement 04/03/2017 with debridement of deep wound abscess with cultures pending. Preliminary cultures with gram-positive cocci in clusters and gram-negative rods. Wound cultures are positive for abundant Escherichia coli. Patient per orthopedic with massive abscess and necrotic fascia. Patient to return to the operating room today 04/05/2017, for further irrigation and debridement per orthopedics. Patient has been seen in consultation by ID and antibiotics adjusted. Continue current regimen of IV vancomycin and IV Rocephin. Clindamycin was discontinued today per ID. ID and orthopedics following and appreciate input and recommendation.   3. Poorly controlledType 2 diabetes mellitus, insulin requiring with neurological complications - Pt reports that he has not had insulin to take in over 1 year.  He recently established with a PCP and is willing to take insulin again. Hemoglobin A1c greater than 15.5.  Basal lantus insulin ordered. TSH normal.  CBG 247 - 321. Patient currently on Lantus 25 units daily, meal coverage insulin 8 units 3 times daily as well as sliding scale insulin. Will keep patient on Lantus for now as patient is to go back to the operating room. Will give patient Lantus 15 units 1 today as he is currently nothing by mouth and subsequently start Lantus 30 units daily tomorrow once patient on a diet. Changes moderate sliding scale insulin 2 resistant sliding scale insulin. Increased meal coverage insulin to 10 units 3 times daily with meals. Postoperatively and when no further debridement going to be done during this hospitalization then will transition to 70/30 regimen and titrate as needed as patient will likely be discharged home on this. Patient  states he was on 30 units of 70/30 in the morning and 18 units at bedtime.  4. Acute Kidney Injury on CKD (suspected) - suspect he has CKD from uncontrolled hypertension and  diabetes mellitus.  IV fluids ordered for hydration, avoid nephrotoxins, follow renal function panel. Renal function seems to be plateauing. Will check a renal ultrasound. Will likely need outpatient follow-up. 5. Essential hypertension - poorly controlled likely secondary to noncompliance. Continue current regimen of amlodopine to 10 mg daily and metoprolol to 50 mg twice daily. 6. DVT of RLE - (acute) He was on xarelto and saw hematology recently and they are suggesting he be on anticoagulation indefinitely given his risk factors for DVT.  He will follow up in 6 months with hematology to discuss further. Patient to go back to the OR tomorrow and a such Xarelto still on hold. Continue full dose Lovenox for now while patient is going back and forth to the operating room. Postop orthopedics to advise when anticoagulation may be resumed.   7. Malnutrition of chronic disease - dietitian to reassess patient as patient was not engaged during last visit. 8. Hypokalemia--repleted. Keep magnesium greater than 2.    DVT prophylaxis: Lovenox on hold preop. Code Status: Full Family Communication: Updated patient. No family at bedside. Disposition Plan: Home versus skilled nursing facility per orthopedics.   Consultants:   Orthopedics: Dr. Sharol Given 04/01/2017  Infectious diseases: Dr. Johnnye Sima 04/04/2017  Procedure  Chest x-ray 04/01/2017    Plain films of the right tib-fib 03/30/2017  ABIs 04/03/2017  Irrigation and debridement RLE, apply wound vac. Debridement of deep abscess. Per Dr. Sharol Given 04/03/2017  Irrigation and debridement right lower extremity pending 04/05/2017  Antimicrobials:   IV Zosyn 03/30/2017>>> 04/04/2017  IV vancomycin 03/30/2017   Oral clindamycin 04/04/2017>>> 04/05/2017  IV Rocephin 04/04/2017   Subjective: Patient laying in bed. No chest pain. No shortness of breath. Patient awaiting to go to the operating room for another irrigation and debridement of his right  leg.  Objective: Vitals:   04/04/17 0900 04/04/17 1815 04/04/17 2152 04/05/17 0954  BP: (!) 138/107 (!) 147/90 (!) 161/98 (!) 158/95  Pulse: 94 94 92 94  Resp: 18 19 18 18   Temp: 98 F (36.7 C) 98.4 F (36.9 C) (!) 97.5 F (36.4 C) 99.1 F (37.3 C)  TempSrc: Oral Oral Oral Oral  SpO2: 97% 96% 96% 95%  Weight:   111.3 kg (245 lb 6 oz)   Height:        Intake/Output Summary (Last 24 hours) at 04/05/17 1002 Last data filed at 04/05/17 0600  Gross per 24 hour  Intake              976 ml  Output             1500 ml  Net             -524 ml   Filed Weights   04/02/17 2130 04/03/17 2100 04/04/17 2152  Weight: 111.2 kg (245 lb 2.4 oz) 111.3 kg (245 lb 6 oz) 111.3 kg (245 lb 6 oz)    Examination:  General exam: Appears calm and comfortable  Respiratory system: Clear to auscultation anterior lung fields. No Crackles, no rhonchi, no wheezing.  Cardiovascular system: S1 & S2 heard, RRR. No JVD, murmurs, rubs, gallops or clicks. No pedal edema. Gastrointestinal system: Abdomen is soft, nontender, nondistended, positive bowel sounds. No organomegaly or masses felt. Normal bowel sounds heard. Central nervous system: Alert and oriented. No focal neurological  deficits. Extremities: Right lower extremity with wound VAC on. Skin: No rashes, lesions or ulcers Psychiatry: Judgement and insight appear normal. Mood & affect appropriate.     Data Reviewed: I have personally reviewed following labs and imaging studies  CBC:  Recent Labs Lab 04/01/17 0254 04/02/17 0713 04/03/17 0332 04/04/17 0236 04/05/17 0311  WBC 15.2* 15.8* 13.9* 12.2* 12.2*  NEUTROABS 11.2* 12.2* 10.6* 11.0* 8.2*  HGB 11.3* 12.0* 12.4* 11.9* 11.5*  HCT 35.2* 37.2* 38.6* 37.2* 35.4*  MCV 89.1 88.8 88.1 88.6 87.4  PLT 320 357 378 408* 915*   Basic Metabolic Panel:  Recent Labs Lab 04/01/17 0254 04/02/17 0713 04/03/17 0322 04/03/17 0332 04/04/17 0236 04/05/17 0311  NA 137 137  --  137 134* 133*  K 4.0  3.7  --  3.2* 4.8 4.3  CL 104 104  --  102 102 101  CO2 25 25  --  27 22 25   GLUCOSE 177* 195*  --  176* 299* 291*  BUN 12 14  --  13 21* 31*  CREATININE 1.90* 2.12*  --  1.77* 1.77* 1.75*  CALCIUM 8.2* 8.3*  --  8.3* 8.3* 8.4*  MG  --   --  1.6*  --  2.2  --    GFR: Estimated Creatinine Clearance: 68.2 mL/min (A) (by C-G formula based on SCr of 1.75 mg/dL (H)). Liver Function Tests:  Recent Labs Lab 04/01/17 0254 04/02/17 0713 04/03/17 0332 04/04/17 0236 04/05/17 0311  AST 11* 11* 14* 18 14*  ALT 14* 13* 11* 14* 14*  ALKPHOS 66 72 69 72 64  BILITOT 0.8 0.8 0.7 0.7 0.4  PROT 6.3* 6.7 6.9 7.3 6.7  ALBUMIN 1.7* 1.7* 1.7* 1.6* 1.7*   No results for input(s): LIPASE, AMYLASE in the last 168 hours. No results for input(s): AMMONIA in the last 168 hours. Coagulation Profile: No results for input(s): INR, PROTIME in the last 168 hours. Cardiac Enzymes: No results for input(s): CKTOTAL, CKMB, CKMBINDEX, TROPONINI in the last 168 hours. BNP (last 3 results) No results for input(s): PROBNP in the last 8760 hours. HbA1C: No results for input(s): HGBA1C in the last 72 hours. CBG:  Recent Labs Lab 04/04/17 0811 04/04/17 1208 04/04/17 1648 04/04/17 2150 04/05/17 0824  GLUCAP 269* 292* 247* 321* 258*   Lipid Profile: No results for input(s): CHOL, HDL, LDLCALC, TRIG, CHOLHDL, LDLDIRECT in the last 72 hours. Thyroid Function Tests: No results for input(s): TSH, T4TOTAL, FREET4, T3FREE, THYROIDAB in the last 72 hours. Anemia Panel: No results for input(s): VITAMINB12, FOLATE, FERRITIN, TIBC, IRON, RETICCTPCT in the last 72 hours. Sepsis Labs:  Recent Labs Lab 03/30/17 1920 03/30/17 2118 03/30/17 2317  LATICACIDVEN 2.29* 2.87* 1.66    Recent Results (from the past 240 hour(s))  Blood culture (routine x 2)     Status: None   Collection Time: 03/30/17  7:02 PM  Result Value Ref Range Status   Specimen Description BLOOD LEFT FOREARM  Final   Special Requests   Final     BOTTLES DRAWN AEROBIC AND ANAEROBIC Blood Culture adequate volume   Culture NO GROWTH 5 DAYS  Final   Report Status 04/04/2017 FINAL  Final  Blood culture (routine x 2)     Status: None   Collection Time: 03/30/17  7:43 PM  Result Value Ref Range Status   Specimen Description BLOOD RIGHT ANTECUBITAL  Final   Special Requests   Final    BOTTLES DRAWN AEROBIC AND ANAEROBIC Blood Culture adequate volume  Culture NO GROWTH 5 DAYS  Final   Report Status 04/04/2017 FINAL  Final  Aerobic Culture (superficial specimen)     Status: None   Collection Time: 03/30/17 11:23 PM  Result Value Ref Range Status   Specimen Description WOUND RIGHT LEG  Final   Special Requests Normal  Final   Gram Stain   Final    RARE WBC PRESENT, PREDOMINANTLY PMN RARE GRAM POSITIVE COCCI IN PAIRS IN CLUSTERS RARE GRAM NEGATIVE RODS    Culture ABUNDANT ESCHERICHIA COLI  Final   Report Status 04/02/2017 FINAL  Final   Organism ID, Bacteria ESCHERICHIA COLI  Final      Susceptibility   Escherichia coli - MIC*    AMPICILLIN >=32 RESISTANT Resistant     CEFAZOLIN 16 SENSITIVE Sensitive     CEFEPIME <=1 SENSITIVE Sensitive     CEFTAZIDIME <=1 SENSITIVE Sensitive     CEFTRIAXONE <=1 SENSITIVE Sensitive     CIPROFLOXACIN <=0.25 SENSITIVE Sensitive     GENTAMICIN <=1 SENSITIVE Sensitive     IMIPENEM <=0.25 SENSITIVE Sensitive     TRIMETH/SULFA >=320 RESISTANT Resistant     AMPICILLIN/SULBACTAM >=32 RESISTANT Resistant     PIP/TAZO <=4 SENSITIVE Sensitive     Extended ESBL NEGATIVE Sensitive     * ABUNDANT ESCHERICHIA COLI  Aerobic/Anaerobic Culture (surgical/deep wound)     Status: None (Preliminary result)   Collection Time: 04/03/17  4:49 PM  Result Value Ref Range Status   Specimen Description ABSCESS  Final   Special Requests RT LATERAL CALF  Final   Gram Stain   Final    ABUNDANT WBC PRESENT, PREDOMINANTLY PMN FEW GRAM POSITIVE COCCI IN CLUSTERS RARE GRAM NEGATIVE RODS Gram Stain Report Called to,Read  Back By and Verified With: A Sheryn Bison RN [phone] 04/03/17 A BROWNING    Culture CULTURE REINCUBATED FOR BETTER GROWTH  Final   Report Status PENDING  Incomplete         Radiology Studies: No results found.      Scheduled Meds: . amLODipine  10 mg Oral Daily  . docusate sodium  100 mg Oral BID  . feeding supplement (PRO-STAT SUGAR FREE 64)  30 mL Oral BID  . insulin aspart  0-20 Units Subcutaneous TID WC  . insulin aspart  0-5 Units Subcutaneous QHS  . insulin aspart  10 Units Subcutaneous TID WC  . insulin glargine  15 Units Subcutaneous Once  . [START ON 04/06/2017] insulin glargine  30 Units Subcutaneous Daily  . living well with diabetes book   Does not apply Once  . metoprolol tartrate  50 mg Oral BID  . polyethylene glycol  17 g Oral Daily   Continuous Infusions: . sodium chloride    . cefTRIAXone (ROCEPHIN)  IV Stopped (04/04/17 1713)  . lactated ringers 50 mL/hr at 04/03/17 1616  . methocarbamol (ROBAXIN)  IV    . vancomycin Stopped (04/04/17 2302)     LOS: 6 days    Time spent: 60 mins    Long Brimage, MD [facility] Pager [phone]  If 7PM-7AM, please contact night-coverage [email or web address] Password [email or web address] 04/05/2017, 10:02 AM

## 2017-04-05 NOTE — Progress Notes (Signed)
INFECTIOUS DISEASE PROGRESS NOTE  ID: [name] is a 46 y.o. male with  Principal Problem:   Sepsis ([place]) Active Problems:   HTN (hypertension)   Hyperglycemia due to type 2 diabetes mellitus ([place])   Acute kidney injury superimposed on chronic kidney disease ([place])   Hypoalbuminemia   Right leg DVT ([place])   Diabetic ulcer of calf ([place])   Noncompliance w/medication treatment due to intermit use of medication   Cellulitis of right lower leg   Sinus tachycardia   Fever   Chronic anticoagulation   Necrotizing fasciitis (HCC)   Cellulitis of right lower extremity   Diabetic ulcer of ankle (HCC)  Subjective: No complaints  Abtx:  Anti-infectives    Start     Dose/Rate Route Frequency Ordered Stop   04/04/17 1700  cefTRIAXone (ROCEPHIN) 2 g in dextrose 5 % 50 mL IVPB     2 g 100 mL/hr over 30 Minutes Intravenous Every 24 hours 04/04/17 1459     04/04/17 1530  clindamycin (CLEOCIN) capsule 600 mg     600 mg Oral Every 8 hours 04/04/17 1505 04/05/17 1529   04/01/17 2200  vancomycin (VANCOCIN) 1,250 mg in sodium chloride 0.9 % 250 mL IVPB     1,250 mg 166.7 mL/hr over 90 Minutes Intravenous Every 24 hours 04/01/17 0805     03/31/17 2100  vancomycin (VANCOCIN) 1,500 mg in sodium chloride 0.9 % 500 mL IVPB  Status:  Discontinued     1,500 mg 250 mL/hr over 120 Minutes Intravenous Every 12 hours 03/31/17 1257 04/01/17 0805   03/31/17 0900  vancomycin (VANCOCIN) IVPB 1000 mg/200 mL premix  Status:  Discontinued     1,000 mg 200 mL/hr over 60 Minutes Intravenous Every 12 hours 03/30/17 2046 03/31/17 1257   03/31/17 0600  piperacillin-tazobactam (ZOSYN) IVPB 3.375 g  Status:  Discontinued     3.375 g 12.5 mL/hr over 240 Minutes Intravenous Every 8 hours 03/30/17 2046 04/04/17 1459   03/30/17 2045  piperacillin-tazobactam (ZOSYN) IVPB 3.375 g     3.375 g 100 mL/hr over 30 Minutes Intravenous  Once 03/30/17 2037 03/30/17 2114   03/30/17 2045  vancomycin (VANCOCIN) IVPB 1000  mg/200 mL premix  Status:  Discontinued     1,000 mg 200 mL/hr over 60 Minutes Intravenous  Once 03/30/17 2037 03/30/17 2042   03/30/17 2045  vancomycin (VANCOCIN) 1,500 mg in sodium chloride 0.9 % 500 mL IVPB     1,500 mg 250 mL/hr over 120 Minutes Intravenous To Emergency Dept 03/30/17 2042 03/30/17 2311      Medications:  Scheduled: . amLODipine  10 mg Oral Daily  . chlorhexidine  60 mL Topical Once  . clindamycin  600 mg Oral Q8H  . docusate sodium  100 mg Oral BID  . feeding supplement (PRO-STAT SUGAR FREE 64)  30 mL Oral BID  . insulin aspart  0-15 Units Subcutaneous TID WC  . insulin aspart  8 Units Subcutaneous TID WC  . insulin glargine  25 Units Subcutaneous Daily  . living well with diabetes book   Does not apply Once  . metoprolol tartrate  50 mg Oral BID  . polyethylene glycol  17 g Oral Daily    Objective: Vital signs in last 24 hours: Temp:  [97.5 F (36.4 C)-98.4 F (36.9 C)] 97.5 F (36.4 C) (09/20 2152) Pulse Rate:  [92-94] 92 (09/20 2152) Resp:  [18-19] 18 (09/20 2152) BP: (138-161)/(90-107) 161/98 (09/20 2152) SpO2:  [96 %-97 %] 96 % (  09/20 2152) Weight:  [111.3 kg (245 lb 6 oz)] 111.3 kg (245 lb 6 oz) (09/20 2152)   General appearance: alert and no distress Resp: clear to auscultation bilaterally Cardio: regular rate and rhythm GI: normal findings: bowel sounds normal and soft, non-tender Extremities: wound with vac in place.   Lab Results  Recent Labs  04/04/17 0236 04/05/17 0311  WBC 12.2* 12.2*  HGB 11.9* 11.5*  HCT 37.2* 35.4*  NA 134* 133*  K 4.8 4.3  CL 102 101  CO2 22 25  BUN 21* 31*  CREATININE 1.77* 1.75*   Liver Panel  Recent Labs  04/04/17 0236 04/05/17 0311  PROT 7.3 6.7  ALBUMIN 1.6* 1.7*  AST 18 14*  ALT 14* 14*  ALKPHOS 72 64  BILITOT 0.7 0.4   Sedimentation Rate No results for input(s): ESRSEDRATE in the last 72 hours. C-Reactive Protein No results for input(s): CRP in the last 72  hours.  Microbiology: Recent Results (from the past 240 hour(s))  Blood culture (routine x 2)     Status: None   Collection Time: 03/30/17  7:02 PM  Result Value Ref Range Status   Specimen Description BLOOD LEFT FOREARM  Final   Special Requests   Final    BOTTLES DRAWN AEROBIC AND ANAEROBIC Blood Culture adequate volume   Culture NO GROWTH 5 DAYS  Final   Report Status 04/04/2017 FINAL  Final  Blood culture (routine x 2)     Status: None   Collection Time: 03/30/17  7:43 PM  Result Value Ref Range Status   Specimen Description BLOOD RIGHT ANTECUBITAL  Final   Special Requests   Final    BOTTLES DRAWN AEROBIC AND ANAEROBIC Blood Culture adequate volume   Culture NO GROWTH 5 DAYS  Final   Report Status 04/04/2017 FINAL  Final  Aerobic Culture (superficial specimen)     Status: None   Collection Time: 03/30/17 11:23 PM  Result Value Ref Range Status   Specimen Description WOUND RIGHT LEG  Final   Special Requests Normal  Final   Gram Stain   Final    RARE WBC PRESENT, PREDOMINANTLY PMN RARE GRAM POSITIVE COCCI IN PAIRS IN CLUSTERS RARE GRAM NEGATIVE RODS    Culture ABUNDANT ESCHERICHIA COLI  Final   Report Status 04/02/2017 FINAL  Final   Organism ID, Bacteria ESCHERICHIA COLI  Final      Susceptibility   Escherichia coli - MIC*    AMPICILLIN >=32 RESISTANT Resistant     CEFAZOLIN 16 SENSITIVE Sensitive     CEFEPIME <=1 SENSITIVE Sensitive     CEFTAZIDIME <=1 SENSITIVE Sensitive     CEFTRIAXONE <=1 SENSITIVE Sensitive     CIPROFLOXACIN <=0.25 SENSITIVE Sensitive     GENTAMICIN <=1 SENSITIVE Sensitive     IMIPENEM <=0.25 SENSITIVE Sensitive     TRIMETH/SULFA >=320 RESISTANT Resistant     AMPICILLIN/SULBACTAM >=32 RESISTANT Resistant     PIP/TAZO <=4 SENSITIVE Sensitive     Extended ESBL NEGATIVE Sensitive     * ABUNDANT ESCHERICHIA COLI  Aerobic/Anaerobic Culture (surgical/deep wound)     Status: None (Preliminary result)   Collection Time: 04/03/17  4:49 PM  Result  Value Ref Range Status   Specimen Description ABSCESS  Final   Special Requests RT LATERAL CALF  Final   Gram Stain   Final    ABUNDANT WBC PRESENT, PREDOMINANTLY PMN FEW GRAM POSITIVE COCCI IN CLUSTERS RARE GRAM NEGATIVE RODS Gram Stain Report Called to,Read Back By and Verified With:  A THACKER RN [id number] 04/03/17 A BROWNING    Culture CULTURE REINCUBATED FOR BETTER GROWTH  Final   Report Status PENDING  Incomplete    Studies/Results: No results found.   Assessment/Plan: Necrotizing fascitis DM2 uncontrolled CKD  Total days of antibiotics: 6              Vancomycin (09/15 - )              Pip-Tazo (09/15 - 9/20 )   Ceftriaxone/Clinda (9/20--)  Would continue current anbx Await Cx ([facility] ID) Stop clinda today For repeat I & D today Improve glc control Will need renal eval eventually, outpt?         Bobby Rumpf Infectious Diseases (pager) [phone] [email or web address] 04/05/2017, 8:17 AM  LOS: 6 days

## 2017-04-05 NOTE — Anesthesia Preprocedure Evaluation (Signed)
Anesthesia Evaluation  Patient identified by MRN, date of birth, ID band Patient awake    Reviewed: Allergy & Precautions, NPO status , Patient's Chart, lab work & pertinent test results  Airway Mallampati: II  TM Distance: >3 FB     Dental   Pulmonary neg pulmonary ROS,    breath sounds clear to auscultation       Cardiovascular hypertension, + Peripheral Vascular Disease   Rhythm:Regular Rate:Normal     Neuro/Psych    GI/Hepatic negative GI ROS, Neg liver ROS,   Endo/Other  diabetes  Renal/GU Renal disease     Musculoskeletal   Abdominal   Peds  Hematology   Anesthesia Other Findings   Reproductive/Obstetrics                             Anesthesia Physical  Anesthesia Plan  ASA: III  Anesthesia Plan: General   Post-op Pain Management:    Induction: Intravenous  PONV Risk Score and Plan: 2 and Ondansetron, Treatment may vary due to age or medical condition and Midazolam  Airway Management Planned: LMA  Additional Equipment:   Intra-op Plan:   Post-operative Plan: Extubation in OR  Informed Consent: I have reviewed the patients History and Physical, chart, labs and discussed the procedure including the risks, benefits and alternatives for the proposed anesthesia with the patient or authorized representative who has indicated his/her understanding and acceptance.   Dental advisory given  Plan Discussed with: CRNA and Anesthesiologist  Anesthesia Plan Comments:         Anesthesia Quick Evaluation

## 2017-04-05 NOTE — Anesthesia Postprocedure Evaluation (Signed)
Anesthesia Post Note  Patient: [name]  Procedure(s) Performed: Procedure(s) (LRB): IRRIGATION AND DEBRIDEMENT RIGHT LEG (Right)     Patient location during evaluation: PACU Anesthesia Type: General Level of consciousness: awake and alert Pain management: pain level controlled Vital Signs Assessment: post-procedure vital signs reviewed and stable Respiratory status: spontaneous breathing, nonlabored ventilation and respiratory function stable Cardiovascular status: blood pressure returned to baseline and stable Postop Assessment: no apparent nausea or vomiting Anesthetic complications: no    Last Vitals:  Vitals:   04/05/17 1237 04/05/17 1257  BP: (!) 167/113 (!) 117/113  Pulse: 88 89  Resp: 12 16  Temp: 37.1 C 37.3 C  SpO2: 94% 97%    Last Pain:  Vitals:   04/05/17 1257  TempSrc: Oral  PainSc:                  Lynda Rainwater

## 2017-04-05 NOTE — Progress Notes (Signed)
Surgical MRSA PCR rejected applied bactorban to nares.

## 2017-04-05 NOTE — Anesthesia Procedure Notes (Signed)
Procedure Name: LMA Insertion Date/Time: 04/05/2017 11:29 AM Performed by: Rejeana Brock L Pre-anesthesia Checklist: Patient identified, Emergency Drugs available, Suction available and Patient being monitored Patient Re-evaluated:Patient Re-evaluated prior to induction Oxygen Delivery Method: Circle system utilized Preoxygenation: Pre-oxygenation with 100% oxygen Induction Type: IV induction Ventilation: Mask ventilation without difficulty LMA: LMA inserted LMA Size: 5.0 Tube type: Oral Number of attempts: 1 Placement Confirmation: positive ETCO2 and breath sounds checked- equal and bilateral Tube secured with: Tape Dental Injury: Teeth and Oropharynx as per pre-operative assessment

## 2017-04-05 NOTE — Interval H&P Note (Signed)
History and Physical Interval Note:  04/05/2017 6:51 AM  [name]  has presented today for surgery, with the diagnosis of Necrotizing Fasciitis Right Leg  The various methods of treatment have been discussed with the patient and family. After consideration of risks, benefits and other options for treatment, the patient has consented to  Procedure(s): IRRIGATION AND DEBRIDEMENT RIGHT LEG (Right) as a surgical intervention .  The patient's history has been reviewed, patient examined, no change in status, stable for surgery.  I have reviewed the patient's chart and labs.  Questions were answered to the patient's satisfaction.     Newt Minion

## 2017-04-05 NOTE — H&P (View-Only) (Signed)
Patient ID: [name], male   DOB: June 27, 1971, 46 y.o.   MRN: [id number] Postoperative day 1 debridement of necrotizing fasciitisright calf. Patient had massive abscess and necrotic fascia. We will plan for return to the operating room on Friday tomorrow for further debridement. Instillation wound VAC is functioning well.

## 2017-04-05 NOTE — Progress Notes (Signed)
ANTICOAGULATION CONSULT NOTE - Follow-Up Consult  Pharmacy Consult for Lovenox Indication: DVT  No Known Allergies  Patient Measurements: Height: 5' 11.5" (181.6 cm) Weight: 245 lb (111.1 kg) IBW/kg (Calculated) : 76.45  Vital Signs: Temp: 99.2 F (37.3 C) (09/21 1257) Temp Source: Oral (09/21 1257) BP: 117/113 (09/21 1257) Pulse Rate: 89 (09/21 1257)  Labs:  Recent Labs  04/03/17 0332 04/04/17 0236 04/05/17 0311  HGB 12.4* 11.9* 11.5*  HCT 38.6* 37.2* 35.4*  PLT 378 408* 474*  CREATININE 1.77* 1.77* 1.75*    Estimated Creatinine Clearance: 68.1 mL/min (A) (by C-G formula based on SCr of 1.75 mg/dL (H)).   Medical History: Past Medical History:  Diagnosis Date  . Diabetes mellitus without complication ([place])   . DVT of proximal lower limb ([place]) 03/20/2017   R calf area  . Gout   . Hypertension     Assessment: 59 YOM with a recent RLE DVT diagnosed on 9/5. The patient was on Xarelto PTA - this was held for plans for debridement by ortho - done 9/19 and planning again 9/21. Pharmacy has been consulted today to start full-dose Lovenox for bridging while Xarelto held.   The patient was taken to the OR on 9/21 for a repeat I&D. Post-procedure ortho was contacted regarding when restart post-op would be appropriate. Per this conversation, Dr. Sharol Given wanted to hold for 3 days post-procedure.   Goal of Therapy:   Anti-Xa level 0.6-1 units/ml 4hrs after LMWH dose given Monitor platelets by anticoagulation protocol: Yes   Plan:  1. Hold Lovenox for now per Dr. Sharol Given 2. Will follow-up in 3 days if Lovenox or Xarelto is intended to be resumed  Thank you for allowing pharmacy to be a part of this patient's care.  Alycia Rossetti, PharmD, BCPS Clinical Pharmacist Pager: [phone] Clinical phone for 04/05/2017 from 7a-3:30p: [phone] If after 3:30p, please call main pharmacy at: [phone] 04/05/2017 3:31 PM

## 2017-04-06 ENCOUNTER — Encounter (HOSPITAL_COMMUNITY): Payer: Self-pay | Admitting: Orthopedic Surgery

## 2017-04-06 DIAGNOSIS — N179 Acute kidney failure, unspecified: Secondary | ICD-10-CM

## 2017-04-06 LAB — BASIC METABOLIC PANEL
Anion gap: 7 (ref 5–15)
BUN: 23 mg/dL — ABNORMAL HIGH (ref 6–20)
CO2: 27 mmol/L (ref 22–32)
Calcium: 8.4 mg/dL — ABNORMAL LOW (ref 8.9–10.3)
Chloride: 102 mmol/L (ref 101–111)
Creatinine, Ser: 1.58 mg/dL — ABNORMAL HIGH (ref 0.61–1.24)
GFR calc Af Amer: 59 mL/min — ABNORMAL LOW (ref >60)
GFR calc non Af Amer: 51 mL/min — ABNORMAL LOW (ref >60)
Glucose, Bld: 190 mg/dL — ABNORMAL HIGH (ref 65–99)
Potassium: 3.8 mmol/L (ref 3.5–5.1)
Sodium: 136 mmol/L (ref 135–145)

## 2017-04-06 LAB — GLUCOSE, CAPILLARY
Glucose-Capillary: 119 mg/dL — ABNORMAL HIGH (ref 65–99)
Glucose-Capillary: 137 mg/dL — ABNORMAL HIGH (ref 65–99)
Glucose-Capillary: 153 mg/dL — ABNORMAL HIGH (ref 65–99)
Glucose-Capillary: 179 mg/dL — ABNORMAL HIGH (ref 65–99)
Glucose-Capillary: 190 mg/dL — ABNORMAL HIGH (ref 65–99)
Glucose-Capillary: 214 mg/dL — ABNORMAL HIGH (ref 65–99)
Glucose-Capillary: 219 mg/dL — ABNORMAL HIGH (ref 65–99)

## 2017-04-06 LAB — CBC WITH DIFFERENTIAL/PLATELET
Basophils Absolute: 0 10*3/uL (ref 0.0–0.1)
Basophils Relative: 0 %
Eosinophils Absolute: 0.1 10*3/uL (ref 0.0–0.7)
Eosinophils Relative: 2 %
HCT: 32.8 % — ABNORMAL LOW (ref 39.0–52.0)
Hemoglobin: 10.6 g/dL — ABNORMAL LOW (ref 13.0–17.0)
Lymphocytes Relative: 27 %
Lymphs Abs: 2.4 10*3/uL (ref 0.7–4.0)
MCH: 28.8 pg (ref 26.0–34.0)
MCHC: 32.3 g/dL (ref 30.0–36.0)
MCV: 89.1 fL (ref 78.0–100.0)
Monocytes Absolute: 0.8 10*3/uL (ref 0.1–1.0)
Monocytes Relative: 9 %
Neutro Abs: 5.7 10*3/uL (ref 1.7–7.7)
Neutrophils Relative %: 62 %
Platelets: 442 10*3/uL — ABNORMAL HIGH (ref 150–400)
RBC: 3.68 MIL/uL — ABNORMAL LOW (ref 4.22–5.81)
RDW: 12.2 % (ref 11.5–15.5)
WBC: 9.1 10*3/uL (ref 4.0–10.5)

## 2017-04-06 NOTE — Progress Notes (Signed)
Subjective: 1 Day Post-Op Procedure(s) (LRB): IRRIGATION AND DEBRIDEMENT RIGHT LEG (Right) Patient reports pain as moderate.  Concerns over toes being wrapped up in wound vac dressing.   Objective: Vital signs in last 24 hours: Temp:  [98.6 F (37 C)-99.2 F (37.3 C)] 98.9 F (37.2 C) (09/22 0425) Pulse Rate:  [82-110] 88 (09/22 0425) Resp:  [12-18] 18 (09/22 0425) BP: (117-167)/(90-113) 146/95 (09/22 0425) SpO2:  [94 %-100 %] 98 % (09/22 0425) Weight:  [245 lb (111.1 kg)-251 lb 11.2 oz (114.2 kg)] 251 lb 11.2 oz (114.2 kg) (09/21 2011)  Intake/Output from previous day: 09/21 0701 - 09/22 0700 In: 2127 [P.O.:596; I.V.:880; IV Piggyback:250] Out: 2325 [Urine:2025; Drains:250; Blood:50] Intake/Output this shift: No intake/output data recorded.   Recent Labs  04/04/17 0236 04/05/17 0311 04/06/17 0250  HGB 11.9* 11.5* 10.6*    Recent Labs  04/05/17 0311 04/06/17 0250  WBC 12.2* 9.1  RBC 4.05* 3.68*  HCT 35.4* 32.8*  PLT 474* 442*    Recent Labs  04/05/17 0311 04/06/17 0250  NA 133* 136  K 4.3 3.8  CL 101 102  CO2 25 27  BUN 31* 23*  CREATININE 1.75* 1.58*  GLUCOSE 291* 190*  CALCIUM 8.4* 8.4*   No results for input(s): LABPT, INR in the last 72 hours.  Incision: dressing C/D/I Wound vac in place with good seal.  Assessment/Plan: 1 Day Post-Op Procedure(s) (LRB): IRRIGATION AND DEBRIDEMENT RIGHT LEG (Right) WBC trending down , afebrile on Rocephin and Vancomycin  Keep dressing intact.  GILBERT CLARK 04/06/2017, 8:39 AM

## 2017-04-06 NOTE — Clinical Social Work Note (Signed)
Clinical Social Work Assessment  Patient Details  Name: [name] MRN: [id number] Date of Birth: 1970/12/28  Date of referral:  04/06/17               Reason for consult:  Facility Placement, Discharge Planning                Permission sought to share information with:  Chartered certified accountant granted to share information::  Yes, Verbal Permission Granted  Name::        Agency::  SNF's  Relationship::     Contact Information:     Housing/Transportation Living arrangements for the past 2 months:  Single Family Home Source of Information:  Patient, Medical Team Patient Interpreter Needed:  None Criminal Activity/Legal Involvement Pertinent to Current Situation/Hospitalization:  No - Comment as needed Significant Relationships:  Parents Lives with:  Parents Do you feel safe going back to the place where you live?  Yes Need for family participation in patient care:  Yes (Comment)  Care giving concerns:  PT recommending SNF once medically stable for discharge.   Social Worker assessment / plan:  CSW met with patient. No supports at bedside. CSW introduced role and explained that PT recommendations would be discussed. Patient agreeable to SNF placement and asked what was available. CSW provided SNF list for review. No further concerns. CSW encouraged patient to contact CSW as needed. CSW will continue to follow patient for support and facilitate discharge to SNF once medically stable.  Employment status:  [employer] information:  Other (Comment Required) Nurse, mental health) PT Recommendations:  [place] / Referral to community resources:  [place]  Patient/Family's Response to care:  Patient agreeable to SNF placement. Patient's mother supportive and involved in patient's care. Patient appreciated social work intervention.  Patient/Family's Understanding of and Emotional Response to Diagnosis, Current Treatment, and  Prognosis:  Patient has a good understanding of the reason for admission and his need for rehab prior to returning home. Patient appears happy with hospital care.  Emotional Assessment Appearance:  Appears stated age Attitude/Demeanor/Rapport:  Other (Pleasant) Affect (typically observed):  Accepting, Appropriate, Calm, Pleasant Orientation:  Oriented to Self, Oriented to Place, Oriented to  Time, Oriented to Situation Alcohol / Substance use:  Never Used Psych involvement (Current and /or in the community):  No (Comment)  Discharge Needs  Concerns to be addressed:  Care Coordination Readmission within the last 30 days:  No Current discharge risk:  Dependent with Mobility Barriers to Discharge:  Ship broker, Continued Medical Work up   Candie Chroman, LCSW 04/06/2017, 3:37 PM

## 2017-04-06 NOTE — Progress Notes (Signed)
Physical Therapy Treatment Patient Details Name: [name] MRN: [id number] DOB: Nov 22, 1970 Today's Date: 04/06/2017    History of Present Illness [name] is a 46 y.o. male who presents with diabetic insensate neuropathy history of hypertension who is currently on Xarelto for a DVT. Patient states that he noticed a blunt trauma to the lateral aspect of his right calf about 3 months ago. Patient states she's been doing wound care. Patient states returned from a driving trip in [place] 2 weeks ago and had acute swelling of the right leg. Patient did go to emergency room and patient's Dopplers were positive for DVT in the right lower extremity popliteal fossa and patient was started on Xarelto. Patient states that since that time he's had foul-smelling drainage peeling necrotic skin and black eschar to the lateral aspect of the right calf. Plan for wound vac placement.    PT Comments    Pt s/p I & D R LE with application of instillation wound vac (04/05/17).  Pt appeared very nervous about moving wound vac.  Educated on use of wound vac and how therapy would move pt with it.  Pt agreeable to sit EOB and performed 3x with max sitting time of 35 seconds.  Con't to recommend SNF.    Follow Up Recommendations  SNF     Equipment Recommendations  None recommended by PT    Recommendations for Other Services       Precautions / Restrictions Precautions Precautions: Fall Precaution Comments: wound vac Restrictions Weight Bearing Restrictions: No    Mobility  Bed Mobility Overal bed mobility: Needs Assistance Bed Mobility: Supine to Sit     Supine to sit: HOB elevated;Supervision     General bed mobility comments: Pt transferred slowly and needed to break movements down.  First he long sat, then turned to EOB.  Sat 3x at EOB with max sitting tolerance of 35 seconds  Transfers                 General transfer comment: declined  Ambulation/Gait                 Stairs            Wheelchair Mobility    Modified Rankin (Stroke Patients Only)       Balance Overall balance assessment: Needs assistance Sitting-balance support: Single extremity supported Sitting balance-Leahy Scale: Fair                                      Cognition Arousal/Alertness: Awake/alert Behavior During Therapy: WFL for tasks assessed/performed;Anxious Overall Cognitive Status: Within Functional Limits for tasks assessed                                        Exercises      General Comments General comments (skin integrity, edema, etc.): wound vac R LE      Pertinent Vitals/Pain Pain Assessment: Faces Faces Pain Scale: Hurts whole lot Pain Location: R distal LE Pain Descriptors / Indicators: Moaning Pain Intervention(s): Limited activity within patient's tolerance;Monitored during session;Repositioned    Home Living                      Prior Function            PT Goals (  current goals can now be found in the care plan section) Acute Rehab PT Goals Patient Stated Goal: to feel better  PT Goal Formulation: With patient Time For Goal Achievement: 04/16/17 Potential to Achieve Goals: Fair Progress towards PT goals: Progressing toward goals    Frequency    Min 3X/week      PT Plan Current plan remains appropriate    Co-evaluation              AM-PAC PT "6 Clicks" Daily Activity  Outcome Measure  Difficulty turning over in bed (including adjusting bedclothes, sheets and blankets)?: A Little Difficulty moving from lying on back to sitting on the side of the bed? : A Lot Difficulty sitting down on and standing up from a chair with arms (e.g., wheelchair, bedside commode, etc,.)?: Unable Help needed moving to and from a bed to chair (including a wheelchair)?: Total Help needed walking in hospital room?: Total Help needed climbing 3-5 steps with a railing? : Total 6 Click Score: 9     End of Session   Activity Tolerance: Patient limited by pain Patient left: in bed;with call bell/phone within reach   PT Visit Diagnosis: Pain Pain - Right/Left: Right Pain - part of body: Leg     Time: 6222-9798 PT Time Calculation (min) (ACUTE ONLY): 17 min  Charges:  $Therapeutic Activity: 8-22 mins                    G Codes:       Rehema Muffley L. Tamala Julian, [place] Pager [phone] 04/06/2017    Galen Manila 04/06/2017, 3:05 PM

## 2017-04-06 NOTE — NC FL2 (Signed)
[place] LEVEL OF CARE SCREENING TOOL     IDENTIFICATION  Patient Name: [name] Birthdate: 25-Dec-1970 Sex: male Admission Date (Current Location): 03/30/2017  [facility] and [place] Number:  Herbalist and Address:  [facility] [place], [place] [address] [place], [place] [address]      Provider Number: [id number]  Attending Physician Name and Address:  Eugenie Filler, MD  Relative Name and Phone Number:       Current Level of Care: Hospital Recommended Level of Care: [place] Prior Approval Number:    Date Approved/Denied:   PASRR Number: [id number]  Discharge Plan: SNF    Current Diagnoses: Patient Active Problem List   Diagnosis Date Noted  . ARF (acute renal failure) ([place])   . Diabetic ulcer of ankle ([place])   . Necrotizing fasciitis ([place])   . Cellulitis of right lower extremity   . Hyperglycemia due to type 2 diabetes mellitus ([place]) 03/31/2017  . Acute kidney injury superimposed on chronic kidney disease ([place]) 03/31/2017  . Hypoalbuminemia 03/31/2017  . Right leg DVT ([place]) 03/31/2017  . Diabetic ulcer of calf ([place]) 03/31/2017  . Noncompliance w/medication treatment due to intermit use of medication 03/31/2017  . Cellulitis of right lower leg 03/31/2017  . Sinus tachycardia 03/31/2017  . Fever 03/31/2017  . Chronic anticoagulation 03/31/2017  . Sepsis ([place]) 03/30/2017  . HTN (hypertension) 03/22/2017  . Diabetes ([place]) 03/22/2017    Orientation RESPIRATION BLADDER Height & Weight     Self, Time, Situation, Place  Normal Continent Weight: 251 lb 11.2 oz (114.2 kg) Height:  5' 11.5" (181.6 cm)  BEHAVIORAL SYMPTOMS/MOOD NEUROLOGICAL BOWEL NUTRITION STATUS   (None)  (None) Continent Diet (Carb modified)  AMBULATORY STATUS COMMUNICATION OF NEEDS Skin   Extensive Assist Verbally Skin abrasions, Surgical wounds, Other (Comment), Wound Vac (Cellulitis)                       Personal Care  Assistance Level of Assistance  Bathing, Feeding, Dressing Bathing Assistance: Limited assistance Feeding assistance: Independent Dressing Assistance: Maximum assistance     Functional Limitations Info  Sight, Hearing, Speech Sight Info: Adequate Hearing Info: Adequate Speech Info: Adequate    SPECIAL CARE FACTORS FREQUENCY  PT (By licensed PT), Blood pressure, OT (By licensed OT)     PT Frequency: 5 x week OT Frequency: 5 x week            Contractures Contractures Info: Not present    Additional Factors Info  Code Status, Allergies Code Status Info: Full Allergies Info: NKDA           Current Medications (04/06/2017):  This is the current hospital active medication list Current Facility-Administered Medications  Medication Dose Route Frequency Provider Last Rate Last Dose  . 0.9 %  sodium chloride infusion   Intravenous Continuous Newt Minion, MD      . 0.9 %  sodium chloride infusion   Intravenous Continuous Newt Minion, MD 10 mL/hr at 04/05/17 1700    . acetaminophen (TYLENOL) tablet 650 mg  650 mg Oral Q6H PRN Newt Minion, MD       Or  . acetaminophen (TYLENOL) suppository 650 mg  650 mg Rectal Q6H PRN Newt Minion, MD      . amLODipine (NORVASC) tablet 10 mg  10 mg Oral Daily Eugenie Filler, MD   10 mg at 04/06/17 1045  . bisacodyl (DULCOLAX) suppository  10 mg  10 mg Rectal Daily PRN Newt Minion, MD      . cefTRIAXone (ROCEPHIN) 2 g in dextrose 5 % 50 mL IVPB  2 g Intravenous Q24H [name], MD 100 mL/hr at 04/05/17 1712 2 g at 04/05/17 1712  . cyclobenzaprine (FLEXERIL) tablet 5 mg  5 mg Oral TID PRN Fuller Plan A, MD   5 mg at 04/04/17 2141  . docusate sodium (COLACE) capsule 100 mg  100 mg Oral BID Newt Minion, MD   100 mg at 04/06/17 1045  . feeding supplement (PRO-STAT SUGAR FREE 64) liquid 30 mL  30 mL Oral BID Johnson, Clanford L, MD   30 mL at 04/05/17 2241  . hydrALAZINE (APRESOLINE) injection 10 mg  10 mg Intravenous Q4H  PRN Johnson, Clanford L, MD      . HYDROcodone-acetaminophen (NORCO/VICODIN) 5-325 MG per tablet 1 tablet  1 tablet Oral Q4H PRN Norval Morton, MD   1 tablet at 04/05/17 1914  . HYDROmorphone (DILAUDID) injection 1 mg  1 mg Intravenous Q2H PRN Newt Minion, MD      . insulin aspart (novoLOG) injection 0-20 Units  0-20 Units Subcutaneous TID WC Eugenie Filler, MD   7 Units at 04/06/17 1306  . insulin aspart (novoLOG) injection 0-5 Units  0-5 Units Subcutaneous QHS Eugenie Filler, MD   3 Units at 04/05/17 2249  . insulin aspart (novoLOG) injection 10 Units  10 Units Subcutaneous TID WC Eugenie Filler, MD   10 Units at 04/06/17 1305  . insulin glargine (LANTUS) injection 30 Units  30 Units Subcutaneous Daily Eugenie Filler, MD   30 Units at 04/06/17 1046  . lactated ringers infusion   Intravenous Continuous Newt Minion, MD 50 mL/hr at 04/03/17 1616    . lactated ringers infusion   Intravenous Continuous Lynda Rainwater, MD 10 mL/hr at 04/05/17 1045    . living well with diabetes book MISC   Does not apply Once Johnson, Clanford L, MD      . magnesium citrate solution 1 Bottle  1 Bottle Oral Once PRN Newt Minion, MD      . methocarbamol (ROBAXIN) tablet 500 mg  500 mg Oral Q6H PRN Newt Minion, MD       Or  . methocarbamol (ROBAXIN) 500 mg in dextrose 5 % 50 mL IVPB  500 mg Intravenous Q6H PRN Newt Minion, MD      . metoCLOPramide (REGLAN) tablet 5-10 mg  5-10 mg Oral Q8H PRN Newt Minion, MD       Or  . metoCLOPramide (REGLAN) injection 5-10 mg  5-10 mg Intravenous Q8H PRN Newt Minion, MD      . metoprolol tartrate (LOPRESSOR) tablet 75 mg  75 mg Oral BID Eugenie Filler, MD   75 mg at 04/06/17 1045  . ondansetron (ZOFRAN) tablet 4 mg  4 mg Oral Q6H PRN Newt Minion, MD       Or  . ondansetron [facility]) injection 4 mg  4 mg Intravenous Q6H PRN Newt Minion, MD      . oxyCODONE (Oxy IR/ROXICODONE) immediate release tablet 5-10 mg  5-10 mg Oral Q3H PRN  Newt Minion, MD      . polyethylene glycol (MIRALAX / GLYCOLAX) packet 17 g  17 g Oral Daily Johnson, Clanford L, MD   17 g at 04/06/17 1046  . polyethylene glycol (MIRALAX / GLYCOLAX) packet 17 g  17 g Oral Daily PRN Newt Minion, MD      . senna-docusate (Senokot-S) tablet 1 tablet  1 tablet Oral BID Eugenie Filler, MD   1 tablet at 04/06/17 1045     Discharge Medications: Please see discharge summary for a list of discharge medications.  Relevant Imaging Results:  Relevant Lab Results:   Additional Information SS#: [id number]  Candie Chroman, LCSW

## 2017-04-06 NOTE — Progress Notes (Signed)
PROGRESS NOTE    [name]  GDJ:[id number] DOB: 06/08/1971 DOA: 03/30/2017 PCP: Tracie Harrier, MD    Brief Narrative:  46 y/o male with poorly controlled insulin requiring diabetes, HTN, gout, CKD, recently diagnosed DVT RLE on xarelto who presented to ED with fever and poorly healing right calf leg wound. Patient also noted to have poor control of his diabetes and hypertension. Patient seen by orthopedics, Dr. Sharol Given patient taken to the operating room for irrigation and debridement as well as wound VAC placement patient noted to have a large abscess which was cultured. A shim for repeat irrigation and debridement 04/05/2017. ID consulted for antibiotic management.     Assessment & Plan:   Principal Problem:   Sepsis ([place]) Active Problems:   Necrotizing fasciitis ([place])   HTN (hypertension)   Hyperglycemia due to type 2 diabetes mellitus ([place])   Acute kidney injury superimposed on chronic kidney disease (HCC)   Hypoalbuminemia   Right leg DVT (H[facility])   Diabetic ulcer of calf ([place])   Noncompliance w/medication treatment due to intermit use of medication   Cellulitis of right lower leg   Sinus tachycardia   Fever   Chronic anticoagulation   Cellulitis of right lower extremity   Diabetic ulcer of ankle ([place])   ARF (acute renal failure) ([place])  1. Sepsis - secondary to cellulitis and infection of right leg calf wound/necrotizing fasciitis - fever curve trended down. Leukocytosis trending down now. Lactic acid trended down. Patient status post wound debridement and debridement of massive deep wound abscess 04/03/2017 with cultures consistent with MSSA and Escherichia coli. Wound cultures positive for abundant Escherichia coli. Patient s/p irrigation and debridement 04/05/2017. Patient was seen in consultation by ID and antibiotics adjusted. Continue current regimen IV Rocephin. Patient received a dose of oral clindamycin 04/04/2017, which has been discontinued. Will DC IV  vancomycin. ID and orthopedics following.  2. Right leg wound/necrotizing fasciitis -  orthopedics s/p wound debridement 04/03/2017 with debridement of deep wound abscess with cultures of MSSA and Escherichia coli. Wound cultures are positive for abundant Escherichia coli. Patient per orthopedic with massive abscess and necrotic fascia. Patient returned to the operating room 04/05/2017, for further irrigation and debridement per orthopedics. Patient has been seen in consultation by ID and antibiotics adjusted. Continue current regimen of IV  Rocephin. Clindamycin was discontinued per ID.ill discontinue IV vancomycin as culture results positive for MSSA and Escherichia coli sensitive to Rocephin. ID and orthopedics following and appreciate input and recommendation.   3. Poorly controlledType 2 diabetes mellitus, insulin requiring with neurological complications - Pt reports that he has not had insulin to take in over 1 year.  He recently established with a PCP and is willing to take insulin again. Hemoglobin A1c greater than 15.5.  Basal lantus insulin ordered. TSH normal.  CBG 179 - 219. Patient currently on Lantus 30 units daily, meal coverage insulin 10 units 3 times daily as well as sliding scale insulin. Will keep patient on Lantus for now as patient is to go back to the operating room. Changed moderate sliding scale insulin to resistant sliding scale insulin.  Postoperatively and when no further debridement going to be done during this hospitalization then will transition to 70/30 regimen and titrate as needed as patient will likely be discharged home on this. Patient states he was on 30 units of 70/30 in the morning and 18 units at bedtime.  4. Acute Kidney Injury on CKD (suspected) - suspect he has CKD from uncontrolled hypertension  and diabetes mellitus.  IV fluids ordered for hydration, avoid nephrotoxins, follow renal function panel. Renal function starting to trendback down. Renal ultrasound negative  for any hydronephrosis or medical renal disease. Will likely need outpatient follow-up. 5. Essential hypertension - poorly controlled likely secondary to noncompliance. blood pressure improving. Continue current regimen of amlodopine to 10 mg daily and metoprolol to 75 mg twice daily. 6. DVT of RLE - (acute) He was on xarelto and saw hematology recently and they are suggesting he be on anticoagulation indefinitely given his risk factors for DVT.  He will follow up in 6 months with hematology to discuss further. Patient to go back to the OR tomorrow and a such Xarelto still on hold. Full dose Lovenox on hold per orthopedics for the next 2-3 days postop, for now while patient is going back and forth to the operating room.  7. Malnutrition of chronic disease - dietitian to reassess patient as patient was not engaged during last visit. 8. Hypokalemia--repleted. Keep magnesium greater than 2.    DVT prophylaxis: Lovenox on hold until 2-3 days postop per orthopedics. Code Status: Full Family Communication: Updated patient. No family at bedside. Disposition Plan: Home versus skilled nursing facility per orthopedics.   Consultants:   Orthopedics: Dr. Sharol Given 04/01/2017  Infectious diseases: Dr. Johnnye Sima 04/04/2017  Procedure  Chest x-ray 04/01/2017    Plain films of the right tib-fib 03/30/2017  ABIs 04/03/2017  Irrigation and debridement RLE, apply wound vac. Debridement of deep abscess. Per Dr. Sharol Given 04/03/2017  Irrigation and debridement right lower extremity  04/05/2017  Renal ultrasound 04/05/2017  Antimicrobials:   IV Zosyn 03/30/2017>>> 04/04/2017  IV vancomycin 03/30/2017>>>>04/06/2017  Oral clindamycin 04/04/2017>>> 04/05/2017  IV Rocephin 04/04/2017   Subjective: Patient laying in bed getting washed up. Patient denies any chest or shortness of breath. Patient states bleeding around his right leg.  Objective: Vitals:   04/05/17 1830 04/05/17 2011 04/06/17 0425 04/06/17  0900  BP: (!) 148/90 (!) 154/98 (!) 146/95 (!) 152/89  Pulse: (!) 107 98 88 87  Resp: 17 18 18 18   Temp: 98.6 F (37 C) 99.2 F (37.3 C) 98.9 F (37.2 C) 98.4 F (36.9 C)  TempSrc: Oral Oral Oral Oral  SpO2: 100% 100% 98% 99%  Weight:  114.2 kg (251 lb 11.2 oz)    Height:        Intake/Output Summary (Last 24 hours) at 04/06/17 1126 Last data filed at 04/06/17 0900  Gross per 24 hour  Intake             2247 ml  Output             2575 ml  Net             -328 ml   Filed Weights   04/04/17 2152 04/05/17 1041 04/05/17 2011  Weight: 111.3 kg (245 lb 6 oz) 111.1 kg (245 lb) 114.2 kg (251 lb 11.2 oz)    Examination:  General exam: Appears calm and comfortable  Respiratory system: Clear to auscultation anterior lung fields. No Crackles, no rhonchi, no wheezing.  Cardiovascular system: S1 & S2 heard, RRR. No JVD, murmurs, rubs, gallops or clicks. No pedal edema. Gastrointestinal system: Abdomen is soft, nontender, nondistended, positive bowel sounds. No organomegaly or masses felt. Normal bowel sounds heard. Central nervous system: Alert and oriented. No focal neurological deficits. Extremities: Right lower extremity with wound VAC on draining bloody drainage. Skin: No rashes, lesions or ulcers Psychiatry: Judgement and insight appear normal. Mood &  affect appropriate.     Data Reviewed: I have personally reviewed following labs and imaging studies  CBC:  Recent Labs Lab 04/02/17 0713 04/03/17 0332 04/04/17 0236 04/05/17 0311 04/06/17 0250  WBC 15.8* 13.9* 12.2* 12.2* 9.1  NEUTROABS 12.2* 10.6* 11.0* 8.2* 5.7  HGB 12.0* 12.4* 11.9* 11.5* 10.6*  HCT 37.2* 38.6* 37.2* 35.4* 32.8*  MCV 88.8 88.1 88.6 87.4 89.1  PLT 357 378 408* 474* 623*   Basic Metabolic Panel:  Recent Labs Lab 04/02/17 0713 04/03/17 0322 04/03/17 0332 04/04/17 0236 04/05/17 0311 04/06/17 0250  NA 137  --  137 134* 133* 136  K 3.7  --  3.2* 4.8 4.3 3.8  CL 104  --  102 102 101 102  CO2  25  --  27 22 25 27   GLUCOSE 195*  --  176* 299* 291* 190*  BUN 14  --  13 21* 31* 23*  CREATININE 2.12*  --  1.77* 1.77* 1.75* 1.58*  CALCIUM 8.3*  --  8.3* 8.3* 8.4* 8.4*  MG  --  1.6*  --  2.2  --   --    GFR: Estimated Creatinine Clearance: 76.5 mL/min (A) (by C-G formula based on SCr of 1.58 mg/dL (H)). Liver Function Tests:  Recent Labs Lab 04/01/17 0254 04/02/17 0713 04/03/17 0332 04/04/17 0236 04/05/17 0311  AST 11* 11* 14* 18 14*  ALT 14* 13* 11* 14* 14*  ALKPHOS 66 72 69 72 64  BILITOT 0.8 0.8 0.7 0.7 0.4  PROT 6.3* 6.7 6.9 7.3 6.7  ALBUMIN 1.7* 1.7* 1.7* 1.6* 1.7*   No results for input(s): LIPASE, AMYLASE in the last 168 hours. No results for input(s): AMMONIA in the last 168 hours. Coagulation Profile: No results for input(s): INR, PROTIME in the last 168 hours. Cardiac Enzymes: No results for input(s): CKTOTAL, CKMB, CKMBINDEX, TROPONINI in the last 168 hours. BNP (last 3 results) No results for input(s): PROBNP in the last 8760 hours. HbA1C: No results for input(s): HGBA1C in the last 72 hours. CBG:  Recent Labs Lab 04/05/17 1636 04/05/17 2010 04/06/17 0011 04/06/17 0421 04/06/17 0756  GLUCAP 202* 273* 219* 179* 190*   Lipid Profile: No results for input(s): CHOL, HDL, LDLCALC, TRIG, CHOLHDL, LDLDIRECT in the last 72 hours. Thyroid Function Tests: No results for input(s): TSH, T4TOTAL, FREET4, T3FREE, THYROIDAB in the last 72 hours. Anemia Panel: No results for input(s): VITAMINB12, FOLATE, FERRITIN, TIBC, IRON, RETICCTPCT in the last 72 hours. Sepsis Labs:  Recent Labs Lab 03/30/17 1920 03/30/17 2118 03/30/17 2317  LATICACIDVEN 2.29* 2.87* 1.66    Recent Results (from the past 240 hour(s))  Blood culture (routine x 2)     Status: None   Collection Time: 03/30/17  7:02 PM  Result Value Ref Range Status   Specimen Description BLOOD LEFT FOREARM  Final   Special Requests   Final    BOTTLES DRAWN AEROBIC AND ANAEROBIC Blood Culture  adequate volume   Culture NO GROWTH 5 DAYS  Final   Report Status 04/04/2017 FINAL  Final  Blood culture (routine x 2)     Status: None   Collection Time: 03/30/17  7:43 PM  Result Value Ref Range Status   Specimen Description BLOOD RIGHT ANTECUBITAL  Final   Special Requests   Final    BOTTLES DRAWN AEROBIC AND ANAEROBIC Blood Culture adequate volume   Culture NO GROWTH 5 DAYS  Final   Report Status 04/04/2017 FINAL  Final  Aerobic Culture (superficial specimen)  Status: None   Collection Time: 03/30/17 11:23 PM  Result Value Ref Range Status   Specimen Description WOUND RIGHT LEG  Final   Special Requests Normal  Final   Gram Stain   Final    RARE WBC PRESENT, PREDOMINANTLY PMN RARE GRAM POSITIVE COCCI IN PAIRS IN CLUSTERS RARE GRAM NEGATIVE RODS    Culture ABUNDANT ESCHERICHIA COLI  Final   Report Status 04/02/2017 FINAL  Final   Organism ID, Bacteria ESCHERICHIA COLI  Final      Susceptibility   Escherichia coli - MIC*    AMPICILLIN >=32 RESISTANT Resistant     CEFAZOLIN 16 SENSITIVE Sensitive     CEFEPIME <=1 SENSITIVE Sensitive     CEFTAZIDIME <=1 SENSITIVE Sensitive     CEFTRIAXONE <=1 SENSITIVE Sensitive     CIPROFLOXACIN <=0.25 SENSITIVE Sensitive     GENTAMICIN <=1 SENSITIVE Sensitive     IMIPENEM <=0.25 SENSITIVE Sensitive     TRIMETH/SULFA >=320 RESISTANT Resistant     AMPICILLIN/SULBACTAM >=32 RESISTANT Resistant     PIP/TAZO <=4 SENSITIVE Sensitive     Extended ESBL NEGATIVE Sensitive     * ABUNDANT ESCHERICHIA COLI  Aerobic/Anaerobic Culture (surgical/deep wound)     Status: None (Preliminary result)   Collection Time: 04/03/17  4:49 PM  Result Value Ref Range Status   Specimen Description ABSCESS  Final   Special Requests RT LATERAL CALF  Final   Gram Stain   Final    ABUNDANT WBC PRESENT, PREDOMINANTLY PMN FEW GRAM POSITIVE COCCI IN CLUSTERS RARE GRAM NEGATIVE RODS Gram Stain Report Called to,Read Back By and Verified With: A Sheryn Bison RN [id number]  04/03/17 A [place]    Culture   Final    ABUNDANT STAPHYLOCOCCUS AUREUS ABUNDANT ESCHERICHIA COLI NO ANAEROBES ISOLATED; CULTURE IN PROGRESS FOR 5 DAYS    Report Status PENDING  Incomplete   Organism ID, Bacteria STAPHYLOCOCCUS AUREUS  Final   Organism ID, Bacteria ESCHERICHIA COLI  Final      Susceptibility   Escherichia coli - MIC*    AMPICILLIN >=32 RESISTANT Resistant     CEFAZOLIN <=4 SENSITIVE Sensitive     CEFEPIME <=1 SENSITIVE Sensitive     CEFTAZIDIME <=1 SENSITIVE Sensitive     CEFTRIAXONE <=1 SENSITIVE Sensitive     CIPROFLOXACIN <=0.25 SENSITIVE Sensitive     GENTAMICIN <=1 SENSITIVE Sensitive     IMIPENEM <=0.25 SENSITIVE Sensitive     TRIMETH/SULFA >=320 RESISTANT Resistant     AMPICILLIN/SULBACTAM >=32 RESISTANT Resistant     PIP/TAZO <=4 SENSITIVE Sensitive     Extended ESBL NEGATIVE Sensitive     * ABUNDANT ESCHERICHIA COLI   Staphylococcus aureus - MIC*    CIPROFLOXACIN <=0.5 SENSITIVE Sensitive     ERYTHROMYCIN <=0.25 SENSITIVE Sensitive     GENTAMICIN <=0.5 SENSITIVE Sensitive     OXACILLIN 0.5 SENSITIVE Sensitive     TETRACYCLINE <=1 SENSITIVE Sensitive     VANCOMYCIN <=0.5 SENSITIVE Sensitive     TRIMETH/SULFA <=10 SENSITIVE Sensitive     CLINDAMYCIN <=0.25 SENSITIVE Sensitive     RIFAMPIN <=0.5 SENSITIVE Sensitive     Inducible Clindamycin NEGATIVE Sensitive     * ABUNDANT STAPHYLOCOCCUS AUREUS  Nasopharyngeal Culture     Status: None (Preliminary result)   Collection Time: 04/05/17  6:55 AM  Result Value Ref Range Status   Specimen Description NASOPHARYNGEAL  Final   Special Requests NONE  Final   Culture CULTURE REINCUBATED FOR BETTER GROWTH  Final   Report Status PENDING  Incomplete  Radiology Studies: US Renal  Result Date: 04/05/2017 CLINICAL DATA:  Acute renal failure EXAM: RENAL / URINARY TRACT ULTRASOUND COMPLETE COMPARISON:  None. FINDINGS: Right Kidney: Length: 11.6 cm. Echogenicity within normal limits. No mass or  hydronephrosis visualized. Left Kidney: Length: 13.1 cm. Echogenicity within normal limits. No mass or hydronephrosis visualized. Bladder: Appears normal for degree of bladder distention. Incidental note made of small left pleural effusion IMPRESSION: 1. Normal renal ultrasound 2. Incidental note made of tiny left pleural effusion Electronically Signed   By: Donavan Foil M.D.   On: 04/05/2017 20:09        Scheduled Meds: . amLODipine  10 mg Oral Daily  . docusate sodium  100 mg Oral BID  . feeding supplement (PRO-STAT SUGAR FREE 64)  30 mL Oral BID  . insulin aspart  0-20 Units Subcutaneous TID WC  . insulin aspart  0-5 Units Subcutaneous QHS  . insulin aspart  10 Units Subcutaneous TID WC  . insulin glargine  30 Units Subcutaneous Daily  . living well with diabetes book   Does not apply Once  . metoprolol tartrate  75 mg Oral BID  . polyethylene glycol  17 g Oral Daily  . senna-docusate  1 tablet Oral BID   Continuous Infusions: . sodium chloride    . sodium chloride 10 mL/hr at 04/05/17 1700  . cefTRIAXone (ROCEPHIN)  IV 2 g (04/05/17 1712)  . lactated ringers 50 mL/hr at 04/03/17 1616  . lactated ringers 10 mL/hr at 04/05/17 1045  . methocarbamol (ROBAXIN)  IV       LOS: 7 days    Time spent: 106 mins    Janalynn Eder, MD [facility] Pager [phone]  If 7PM-7AM, please contact night-coverage [email or web address] Password [email or web address] 04/06/2017, 11:26 AM

## 2017-04-06 NOTE — Clinical Social Work Placement (Signed)
   CLINICAL SOCIAL WORK PLACEMENT  NOTE  Date:  04/06/2017  Patient Details  Name: [name] MRN: [id number] Date of Birth: [birth year]  Clinical Social Work is seeking post-discharge placement for this patient at the [place] level of care (*CSW will initial, date and re-position this form in  chart as items are completed):  Yes   Patient/family provided with [place] Work Department's list of facilities offering this level of care within the geographic area requested by the patient (or if unable, by the patient's family).  Yes   Patient/family informed of their freedom to choose among providers that offer the needed level of care, that participate in Medicare, Medicaid or managed care program needed by the patient, have an available bed and are willing to accept the patient.  Yes   Patient/family informed of [facility]'s ownership interest in [facility] and [facility], as well as of the fact that they are under no obligation to receive care at these facilities.  PASRR submitted to EDS on 04/06/17     PASRR number received on 04/06/17     Existing PASRR number confirmed on       FL2 transmitted to all facilities in geographic area requested by pt/family on 04/06/17     FL2 transmitted to all facilities within larger geographic area on       Patient informed that his/her managed care company has contracts with or will negotiate with certain facilities, including the following:            Patient/family informed of bed offers received.  Patient chooses bed at       Physician recommends and patient chooses bed at      Patient to be transferred to   on  .  Patient to be transferred to facility by       Patient family notified on   of transfer.  Name of family member notified:        PHYSICIAN Please sign FL2     Additional Comment:    _______________________________________________ Candie Chroman, LCSW 04/06/2017,  3:39 PM

## 2017-04-07 LAB — BASIC METABOLIC PANEL
Anion gap: 5 (ref 5–15)
BUN: 20 mg/dL (ref 6–20)
CO2: 30 mmol/L (ref 22–32)
Calcium: 8.5 mg/dL — ABNORMAL LOW (ref 8.9–10.3)
Chloride: 103 mmol/L (ref 101–111)
Creatinine, Ser: 1.61 mg/dL — ABNORMAL HIGH (ref 0.61–1.24)
GFR calc Af Amer: 58 mL/min — ABNORMAL LOW (ref >60)
GFR calc non Af Amer: 50 mL/min — ABNORMAL LOW (ref >60)
Glucose, Bld: 167 mg/dL — ABNORMAL HIGH (ref 65–99)
Potassium: 4.1 mmol/L (ref 3.5–5.1)
Sodium: 138 mmol/L (ref 135–145)

## 2017-04-07 LAB — GLUCOSE, CAPILLARY
Glucose-Capillary: 149 mg/dL — ABNORMAL HIGH (ref 65–99)
Glucose-Capillary: 156 mg/dL — ABNORMAL HIGH (ref 65–99)
Glucose-Capillary: 170 mg/dL — ABNORMAL HIGH (ref 65–99)
Glucose-Capillary: 173 mg/dL — ABNORMAL HIGH (ref 65–99)
Glucose-Capillary: 190 mg/dL — ABNORMAL HIGH (ref 65–99)
Glucose-Capillary: 87 mg/dL (ref 65–99)

## 2017-04-07 LAB — NASOPHARYNGEAL CULTURE: Culture: NORMAL

## 2017-04-07 LAB — CBC
HCT: 31.8 % — ABNORMAL LOW (ref 39.0–52.0)
Hemoglobin: 10 g/dL — ABNORMAL LOW (ref 13.0–17.0)
MCH: 28.2 pg (ref 26.0–34.0)
MCHC: 31.4 g/dL (ref 30.0–36.0)
MCV: 89.8 fL (ref 78.0–100.0)
Platelets: 435 10*3/uL — ABNORMAL HIGH (ref 150–400)
RBC: 3.54 MIL/uL — ABNORMAL LOW (ref 4.22–5.81)
RDW: 12.3 % (ref 11.5–15.5)
WBC: 9 10*3/uL (ref 4.0–10.5)

## 2017-04-07 MED ORDER — SODIUM CHLORIDE 0.9 % IV SOLN
INTRAVENOUS | Status: DC
Start: 2017-04-07 — End: 2017-04-07

## 2017-04-07 MED ORDER — SODIUM CHLORIDE 0.9 % IV SOLN
INTRAVENOUS | Status: AC
  Administered 2017-04-07 (×2): via INTRAVENOUS

## 2017-04-07 NOTE — Progress Notes (Signed)
PROGRESS NOTE    [name]  EPP:[id number] DOB: [birth year] DOA: 03/30/2017 PCP: Tracie Harrier, MD    Brief Narrative:  46 y/o male with poorly controlled insulin requiring diabetes, HTN, gout, CKD, recently diagnosed DVT RLE on xarelto who presented to ED with fever and poorly healing right calf leg wound. Patient also noted to have poor control of his diabetes and hypertension. Patient seen by orthopedics, Dr. Sharol Given patient taken to the operating room for irrigation and debridement as well as wound VAC placement patient noted to have a large abscess which was cultured. A shim for repeat irrigation and debridement 04/05/2017. ID consulted for antibiotic management.     Assessment & Plan:   Principal Problem:   Sepsis ([place]) Active Problems:   Necrotizing fasciitis ([place])   HTN (hypertension)   Hyperglycemia due to type 2 diabetes mellitus ([place])   Acute kidney injury superimposed on chronic kidney disease (HCC)   Hypoalbuminemia   Right leg DVT (H[facility])   Diabetic ulcer of calf ([place])   Noncompliance w/medication treatment due to intermit use of medication   Cellulitis of right lower leg   Sinus tachycardia   Fever   Chronic anticoagulation   Cellulitis of right lower extremity   Diabetic ulcer of ankle ([place])   ARF (acute renal failure) ([place])  1. Sepsis - secondary to cellulitis and infection of right leg calf wound/necrotizing fasciitis - fever curve trended down. Leukocytosis trending down now. Lactic acid trended down. Patient status post wound debridement and debridement of massive deep wound abscess 04/03/2017 with cultures consistent with MSSA and Escherichia coli. Wound cultures positive for abundant Escherichia coli. Patient s/p irrigation and debridement 04/05/2017. Patient was seen in consultation by ID and antibiotics adjusted. Continue current regimen IV Rocephin. Patient received a dose of oral clindamycin 04/04/2017, which has been discontinued. Discontinued IV  vancomycin. ID and orthopedics following.  2. Right leg wound/necrotizing fasciitis -  orthopedics s/p wound debridement 04/03/2017 with debridement of deep wound abscess with cultures of MSSA and Escherichia coli. Wound cultures are positive for abundant Escherichia coli. Patient per orthopedic with massive abscess and necrotic fascia. Patient returned to the operating room 04/05/2017, for further irrigation and debridement per orthopedics. Patient has been seen in consultation by ID and antibiotics adjusted. Continue current regimen of IV  Rocephin. Clindamycin was discontinued per ID. Discontinued IV vancomycin as culture results positive for MSSA and Escherichia coli sensitive to Rocephin. ID and orthopedics following and appreciate input and recommendation.   3. Poorly controlledType 2 diabetes mellitus, insulin requiring with neurological complications - Pt reports that he has not had insulin to take in over 1 year.  He recently established with a PCP and is willing to take insulin again. Hemoglobin A1c greater than 15.5.  Basal lantus insulin ordered. TSH normal.  CBG 149 - 173. Patient currently on Lantus 30 units daily, meal coverage insulin 10 units 3 times daily as well as sliding scale insulin. Will keep patient on Lantus for now as patient is going back and forth to the operating room. Changed moderate sliding scale insulin to resistant sliding scale insulin.  Postoperatively and when no further debridement going to be done during this hospitalization then will transition to 70/30 regimen and titrate as needed as patient will likely be discharged home on this. Patient states he was on 30 units of 70/30 in the morning and 18 units at bedtime.  4. Acute Kidney Injury on CKD (suspected) - suspect he has CKD from uncontrolled hypertension  and diabetes mellitus.  IV fluids ordered for hydration, avoid nephrotoxins, follow renal function panel. Renal function seems to be plateauing. Renal ultrasound  negative for any hydronephrosis or medical renal disease. Urine protein to creatinine ratio pending. Will likely need outpatient follow-up. 5. Essential hypertension - poorly controlled likely secondary to noncompliance. Blood pressure control improvement with current regimen of Norvasc 10 mg daily and metoprolol 75 mg twice daily. 6. DVT of RLE - (acute) He was on xarelto and saw hematology recently and they are suggesting he be on anticoagulation indefinitely given his risk factors for DVT.  He will follow up in 6 months with hematology to discuss further. Patient going back and forth to the OR and as such Xarelto still on hold. Full dose Lovenox on hold per orthopedics for the next 2-3 days postop, for now while patient is going back and forth to the operating room.  7. Malnutrition of chronic disease - dietitian to reassess patient as patient was not engaged during last visit. 8. Hypokalemia--repleted. Keep magnesium greater than 2.    DVT prophylaxis: Lovenox on hold until 2-3 days postop per orthopedics. Code Status: Full Family Communication: Updated patient. No family at bedside. Disposition Plan: skilled nursing facility when medically stable and per orthopedics.   Consultants:   Orthopedics: Dr. Sharol Given 04/01/2017  Infectious diseases: Dr. Johnnye Sima 04/04/2017  Procedure  Chest x-ray 04/01/2017    Plain films of the right tib-fib 03/30/2017  ABIs 04/03/2017  Irrigation and debridement RLE, apply wound vac. Debridement of deep abscess. Per Dr. Sharol Given 04/03/2017  Irrigation and debridement right lower extremity  04/05/2017  Renal ultrasound 04/05/2017  Antimicrobials:   IV Zosyn 03/30/2017>>> 04/04/2017  IV vancomycin 03/30/2017>>>>04/06/2017  Oral clindamycin 04/04/2017>>> 04/05/2017  IV Rocephin 04/04/2017   Subjective: Patient laying in bed. No chest pain. No shortness of breath. Patient states right lower extremity pain managed.   Objective: Vitals:   04/06/17  1628 04/06/17 2000 04/07/17 0418 04/07/17 0834  BP: (!) 167/97 (!) 149/94 123/87 (!) 132/93  Pulse: 90 98 81 87  Resp: 18 16 18 18   Temp: 98.9 F (37.2 C) 98.9 F (37.2 C) 98.4 F (36.9 C) 98.2 F (36.8 C)  TempSrc: Oral  Oral Oral  SpO2: 96% 98% 99% 100%  Weight:  108.4 kg (239 lb)    Height:        Intake/Output Summary (Last 24 hours) at 04/07/17 1132 Last data filed at 04/07/17 1040  Gross per 24 hour  Intake               60 ml  Output             1350 ml  Net            -1290 ml   Filed Weights   04/05/17 1041 04/05/17 2011 04/06/17 2000  Weight: 111.1 kg (245 lb) 114.2 kg (251 lb 11.2 oz) 108.4 kg (239 lb)    Examination:  General exam: NAD Respiratory system: Clear to auscultation anterior lung fields. No Crackles, no rhonchi, no wheezing.  Cardiovascular system: S1 & S2 heard, RRR. No JVD, murmurs, rubs, gallops or clicks. No pedal edema. Gastrointestinal system: Abdomen is soft, nontender, nondistended, positive bowel sounds. No organomegaly or masses felt. Normal bowel sounds heard. Central nervous system: Alert and oriented X 3. No focal neurological deficits. Extremities: Right lower extremity with wound VAC on draining bloody drainage. Skin: No rashes, lesions or ulcers Psychiatry: Judgement and insight appear normal. Mood & affect appropriate.  Data Reviewed: I have personally reviewed following labs and imaging studies  CBC:  Recent Labs Lab 04/02/17 0713 04/03/17 0332 04/04/17 0236 04/05/17 0311 04/06/17 0250 04/07/17 0257  WBC 15.8* 13.9* 12.2* 12.2* 9.1 9.0  NEUTROABS 12.2* 10.6* 11.0* 8.2* 5.7  --   HGB 12.0* 12.4* 11.9* 11.5* 10.6* 10.0*  HCT 37.2* 38.6* 37.2* 35.4* 32.8* 31.8*  MCV 88.8 88.1 88.6 87.4 89.1 89.8  PLT 357 378 408* 474* 442* 811*   Basic Metabolic Panel:  Recent Labs Lab 04/03/17 0322 04/03/17 0332 04/04/17 0236 04/05/17 0311 04/06/17 0250 04/07/17 0257  NA  --  137 134* 133* 136 138  K  --  3.2* 4.8 4.3 3.8  4.1  CL  --  102 102 101 102 103  CO2  --  27 22 25 27 30   GLUCOSE  --  176* 299* 291* 190* 167*  BUN  --  13 21* 31* 23* 20  CREATININE  --  1.77* 1.77* 1.75* 1.58* 1.61*  CALCIUM  --  8.3* 8.3* 8.4* 8.4* 8.5*  MG 1.6*  --  2.2  --   --   --    GFR: Estimated Creatinine Clearance: 73.2 mL/min (A) (by C-G formula based on SCr of 1.61 mg/dL (H)). Liver Function Tests:  Recent Labs Lab 04/01/17 0254 04/02/17 0713 04/03/17 0332 04/04/17 0236 04/05/17 0311  AST 11* 11* 14* 18 14*  ALT 14* 13* 11* 14* 14*  ALKPHOS 66 72 69 72 64  BILITOT 0.8 0.8 0.7 0.7 0.4  PROT 6.3* 6.7 6.9 7.3 6.7  ALBUMIN 1.7* 1.7* 1.7* 1.6* 1.7*   No results for input(s): LIPASE, AMYLASE in the last 168 hours. No results for input(s): AMMONIA in the last 168 hours. Coagulation Profile: No results for input(s): INR, PROTIME in the last 168 hours. Cardiac Enzymes: No results for input(s): CKTOTAL, CKMB, CKMBINDEX, TROPONINI in the last 168 hours. BNP (last 3 results) No results for input(s): PROBNP in the last 8760 hours. HbA1C: No results for input(s): HGBA1C in the last 72 hours. CBG:  Recent Labs Lab 04/06/17 1958 04/06/17 2336 04/07/17 0416 04/07/17 0744 04/07/17 1129  GLUCAP 137* 153* 156* 149* 173*   Lipid Profile: No results for input(s): CHOL, HDL, LDLCALC, TRIG, CHOLHDL, LDLDIRECT in the last 72 hours. Thyroid Function Tests: No results for input(s): TSH, T4TOTAL, FREET4, T3FREE, THYROIDAB in the last 72 hours. Anemia Panel: No results for input(s): VITAMINB12, FOLATE, FERRITIN, TIBC, IRON, RETICCTPCT in the last 72 hours. Sepsis Labs: No results for input(s): PROCALCITON, LATICACIDVEN in the last 168 hours.  Recent Results (from the past 240 hour(s))  Blood culture (routine x 2)     Status: None   Collection Time: 03/30/17  7:02 PM  Result Value Ref Range Status   Specimen Description BLOOD LEFT FOREARM  Final   Special Requests   Final    BOTTLES DRAWN AEROBIC AND ANAEROBIC  Blood Culture adequate volume   Culture NO GROWTH 5 DAYS  Final   Report Status 04/04/2017 FINAL  Final  Blood culture (routine x 2)     Status: None   Collection Time: 03/30/17  7:43 PM  Result Value Ref Range Status   Specimen Description BLOOD RIGHT ANTECUBITAL  Final   Special Requests   Final    BOTTLES DRAWN AEROBIC AND ANAEROBIC Blood Culture adequate volume   Culture NO GROWTH 5 DAYS  Final   Report Status 04/04/2017 FINAL  Final  Aerobic Culture (superficial specimen)     Status:  None   Collection Time: 03/30/17 11:23 PM  Result Value Ref Range Status   Specimen Description WOUND RIGHT LEG  Final   Special Requests Normal  Final   Gram Stain   Final    RARE WBC PRESENT, PREDOMINANTLY PMN RARE GRAM POSITIVE COCCI IN PAIRS IN CLUSTERS RARE GRAM NEGATIVE RODS    Culture ABUNDANT ESCHERICHIA COLI  Final   Report Status 04/02/2017 FINAL  Final   Organism ID, Bacteria ESCHERICHIA COLI  Final      Susceptibility   Escherichia coli - MIC*    AMPICILLIN >=32 RESISTANT Resistant     CEFAZOLIN 16 SENSITIVE Sensitive     CEFEPIME <=1 SENSITIVE Sensitive     CEFTAZIDIME <=1 SENSITIVE Sensitive     CEFTRIAXONE <=1 SENSITIVE Sensitive     CIPROFLOXACIN <=0.25 SENSITIVE Sensitive     GENTAMICIN <=1 SENSITIVE Sensitive     IMIPENEM <=0.25 SENSITIVE Sensitive     TRIMETH/SULFA >=320 RESISTANT Resistant     AMPICILLIN/SULBACTAM >=32 RESISTANT Resistant     PIP/TAZO <=4 SENSITIVE Sensitive     Extended ESBL NEGATIVE Sensitive     * ABUNDANT ESCHERICHIA COLI  Aerobic/Anaerobic Culture (surgical/deep wound)     Status: None (Preliminary result)   Collection Time: 04/03/17  4:49 PM  Result Value Ref Range Status   Specimen Description ABSCESS  Final   Special Requests RT LATERAL CALF  Final   Gram Stain   Final    ABUNDANT WBC PRESENT, PREDOMINANTLY PMN FEW GRAM POSITIVE COCCI IN CLUSTERS RARE GRAM NEGATIVE RODS Gram Stain Report Called to,Read Back By and Verified With: A Sheryn Bison  RN 3710 04/03/17 A BROWNING    Culture   Final    ABUNDANT STAPHYLOCOCCUS AUREUS ABUNDANT ESCHERICHIA COLI NO ANAEROBES ISOLATED; CULTURE IN PROGRESS FOR 5 DAYS    Report Status PENDING  Incomplete   Organism ID, Bacteria STAPHYLOCOCCUS AUREUS  Final   Organism ID, Bacteria ESCHERICHIA COLI  Final      Susceptibility   Escherichia coli - MIC*    AMPICILLIN >=32 RESISTANT Resistant     CEFAZOLIN <=4 SENSITIVE Sensitive     CEFEPIME <=1 SENSITIVE Sensitive     CEFTAZIDIME <=1 SENSITIVE Sensitive     CEFTRIAXONE <=1 SENSITIVE Sensitive     CIPROFLOXACIN <=0.25 SENSITIVE Sensitive     GENTAMICIN <=1 SENSITIVE Sensitive     IMIPENEM <=0.25 SENSITIVE Sensitive     TRIMETH/SULFA >=320 RESISTANT Resistant     AMPICILLIN/SULBACTAM >=32 RESISTANT Resistant     PIP/TAZO <=4 SENSITIVE Sensitive     Extended ESBL NEGATIVE Sensitive     * ABUNDANT ESCHERICHIA COLI   Staphylococcus aureus - MIC*    CIPROFLOXACIN <=0.5 SENSITIVE Sensitive     ERYTHROMYCIN <=0.25 SENSITIVE Sensitive     GENTAMICIN <=0.5 SENSITIVE Sensitive     OXACILLIN 0.5 SENSITIVE Sensitive     TETRACYCLINE <=1 SENSITIVE Sensitive     VANCOMYCIN <=0.5 SENSITIVE Sensitive     TRIMETH/SULFA <=10 SENSITIVE Sensitive     CLINDAMYCIN <=0.25 SENSITIVE Sensitive     RIFAMPIN <=0.5 SENSITIVE Sensitive     Inducible Clindamycin NEGATIVE Sensitive     * ABUNDANT STAPHYLOCOCCUS AUREUS  Nasopharyngeal Culture     Status: None   Collection Time: 04/05/17  6:55 AM  Result Value Ref Range Status   Specimen Description NASOPHARYNGEAL  Final   Special Requests NONE  Final   Culture RARE NORMAL NASOPHARYNGEAL FLORA  Final   Report Status 04/07/2017 FINAL  Final  Radiology Studies: US Renal  Result Date: 04/05/2017 CLINICAL DATA:  Acute renal failure EXAM: RENAL / URINARY TRACT ULTRASOUND COMPLETE COMPARISON:  None. FINDINGS: Right Kidney: Length: 11.6 cm. Echogenicity within normal limits. No mass or hydronephrosis  visualized. Left Kidney: Length: 13.1 cm. Echogenicity within normal limits. No mass or hydronephrosis visualized. Bladder: Appears normal for degree of bladder distention. Incidental note made of small left pleural effusion IMPRESSION: 1. Normal renal ultrasound 2. Incidental note made of tiny left pleural effusion Electronically Signed   By: Donavan Foil M.D.   On: 04/05/2017 20:09        Scheduled Meds: . amLODipine  10 mg Oral Daily  . docusate sodium  100 mg Oral BID  . feeding supplement (PRO-STAT SUGAR FREE 64)  30 mL Oral BID  . insulin aspart  0-20 Units Subcutaneous TID WC  . insulin aspart  0-5 Units Subcutaneous QHS  . insulin aspart  10 Units Subcutaneous TID WC  . insulin glargine  30 Units Subcutaneous Daily  . living well with diabetes book   Does not apply Once  . metoprolol tartrate  75 mg Oral BID  . polyethylene glycol  17 g Oral Daily  . senna-docusate  1 tablet Oral BID   Continuous Infusions: . sodium chloride    . sodium chloride 10 mL/hr at 04/05/17 1700  . sodium chloride 100 mL/hr at 04/07/17 0831  . cefTRIAXone (ROCEPHIN)  IV Stopped (04/06/17 1757)  . lactated ringers 50 mL/hr at 04/03/17 1616  . lactated ringers 10 mL/hr at 04/05/17 1045  . methocarbamol (ROBAXIN)  IV       LOS: 8 days    Time spent: 41 mins    Rakeen Gaillard, MD [facility] Pager [phone]  If 7PM-7AM, please contact night-coverage [email or web address] Password [facility] 04/07/2017, 11:32 AM

## 2017-04-08 DIAGNOSIS — I120 Hypertensive chronic kidney disease with stage 5 chronic kidney disease or end stage renal disease: Secondary | ICD-10-CM

## 2017-04-08 LAB — BASIC METABOLIC PANEL
Anion gap: 6 (ref 5–15)
BUN: 19 mg/dL (ref 6–20)
CO2: 26 mmol/L (ref 22–32)
Calcium: 8.2 mg/dL — ABNORMAL LOW (ref 8.9–10.3)
Chloride: 104 mmol/L (ref 101–111)
Creatinine, Ser: 1.49 mg/dL — ABNORMAL HIGH (ref 0.61–1.24)
GFR calc Af Amer: >60 mL/min (ref >60)
GFR calc non Af Amer: 55 mL/min — ABNORMAL LOW (ref >60)
Glucose, Bld: 158 mg/dL — ABNORMAL HIGH (ref 65–99)
Potassium: 4.1 mmol/L (ref 3.5–5.1)
Sodium: 136 mmol/L (ref 135–145)

## 2017-04-08 LAB — CBC
HCT: 29.2 % — ABNORMAL LOW (ref 39.0–52.0)
Hemoglobin: 9.3 g/dL — ABNORMAL LOW (ref 13.0–17.0)
MCH: 28.5 pg (ref 26.0–34.0)
MCHC: 31.8 g/dL (ref 30.0–36.0)
MCV: 89.6 fL (ref 78.0–100.0)
Platelets: 435 10*3/uL — ABNORMAL HIGH (ref 150–400)
RBC: 3.26 MIL/uL — ABNORMAL LOW (ref 4.22–5.81)
RDW: 12.3 % (ref 11.5–15.5)
WBC: 9 10*3/uL (ref 4.0–10.5)

## 2017-04-08 LAB — AEROBIC/ANAEROBIC CULTURE W GRAM STAIN (SURGICAL/DEEP WOUND)

## 2017-04-08 LAB — GLUCOSE, CAPILLARY
Glucose-Capillary: 151 mg/dL — ABNORMAL HIGH (ref 65–99)
Glucose-Capillary: 148 mg/dL — ABNORMAL HIGH (ref 65–99)
Glucose-Capillary: 139 mg/dL — ABNORMAL HIGH (ref 65–99)
Glucose-Capillary: 113 mg/dL — ABNORMAL HIGH (ref 65–99)
Glucose-Capillary: 111 mg/dL — ABNORMAL HIGH (ref 65–99)

## 2017-04-08 MED ORDER — METOPROLOL TARTRATE 100 MG PO TABS
100.0000 mg | ORAL_TABLET | Freq: Two times a day (BID) | ORAL | Status: DC
  Administered 2017-04-08 – 2017-04-12 (×9): 100 mg via ORAL
  Filled 2017-04-08: qty 1
  Filled 2017-04-08: qty 2
  Filled 2017-04-08: qty 1
  Filled 2017-04-08 (×5): qty 2
  Filled 2017-04-08: qty 1
  Filled 2017-04-08: qty 2

## 2017-04-08 MED ORDER — SODIUM CHLORIDE 0.9% FLUSH
10.0000 mL | INTRAVENOUS | Status: DC | PRN
  Administered 2017-04-09: 10 mL
  Filled 2017-04-08: qty 40

## 2017-04-08 MED ORDER — CEFAZOLIN SODIUM-DEXTROSE 2-4 GM/100ML-% IV SOLN
2.0000 g | Freq: Three times a day (TID) | INTRAVENOUS | Status: DC
  Administered 2017-04-08 – 2017-04-12 (×12): 2 g via INTRAVENOUS
  Filled 2017-04-08 (×17): qty 100

## 2017-04-08 MED ORDER — ENOXAPARIN SODIUM 120 MG/0.8ML ~~LOC~~ SOLN
110.0000 mg | Freq: Two times a day (BID) | SUBCUTANEOUS | Status: DC
  Administered 2017-04-08 – 2017-04-11 (×4): 110 mg via SUBCUTANEOUS
  Filled 2017-04-08 (×8): qty 0.73

## 2017-04-08 MED ORDER — PRO-STAT SUGAR FREE PO LIQD
30.0000 mL | Freq: Three times a day (TID) | ORAL | Status: DC
  Administered 2017-04-08 – 2017-04-12 (×13): 30 mL via ORAL
  Filled 2017-04-08 (×12): qty 30

## 2017-04-08 NOTE — Progress Notes (Signed)
Nutrition Follow-up  DOCUMENTATION CODES:   Obesity unspecified  INTERVENTION:   -Increase Pro-stat to TID  -Encourage protein intake with all meals  -Pt declined scheduled snacks between meals  NUTRITION DIAGNOSIS:   Increased nutrient needs related to wound healing as evidenced by estimated needs.  Continues but being addressed via improved appetite, supplements  GOAL:   Patient will meet greater than or equal to 90% of their needs  Progressing  MONITOR:   PO intake, Supplement acceptance, Labs, Weight trends, Skin  REASON FOR ASSESSMENT:   Consult Wound healing  ASSESSMENT:   46 yo male admitted with sepsis secondary to cellulitis and infection of right lef calf wound. Hyperglycemia with poorly controlled DM, pt has not taken diabetic meds in over a year; AKI on CKD III. Pt with hx of HTN, DM, gout, CKD, DVT  9/21 OR for irrigation and debridement of right leg  Pt reports appetite improved and eating most all of meal trays; pt reports he is eating protein at all meals and has been taking at least 1 Pro-Stat per day. Pt agreeable to taking at least 2 Pro-Stat daily  Weight trend noted. Weight up and down but no significant trend  Labs: Creatinine 1.49, CBGs 87-190 Meds: ss novolog, lantus, LR at 50 ml/hr  Diet Order:  Diet Carb Modified Fluid consistency: Thin; Room service appropriate? Yes  Skin:  Wound (see comment) (infected right calf wound)  Last BM:  9/23  Height:   Ht Readings from Last 1 Encounters:  04/05/17 5' 11.5" (1.816 m)    Weight:   Wt Readings from Last 1 Encounters:  04/07/17 239 lb 3.2 oz (108.5 kg)    Ideal Body Weight:  79.5 kg  BMI:  Body mass index is 32.9 kg/m.  Estimated Nutritional Needs:   Kcal:  2000-2400 kcals  Protein:  110-125 g  Fluid:  >/= 2 L  EDUCATION NEEDS:   Education needs addressed  Kerman Passey MS, RD, LDN [phone] Pager  [phone] Weekend/On-Call Pager

## 2017-04-08 NOTE — Progress Notes (Signed)
PROGRESS NOTE    [name]  IFO:[id number] DOB: 06/12/1971 DOA: 03/30/2017 PCP: Tracie Harrier, MD    Brief Narrative:  46 y/o male with poorly controlled insulin requiring diabetes, HTN, gout, CKD, recently diagnosed DVT RLE on xarelto who presented to ED with fever and poorly healing right calf leg wound. Patient also noted to have poor control of his diabetes and hypertension. Patient seen by orthopedics, Dr. Sharol Given patient taken to the operating room for irrigation and debridement as well as wound VAC placement patient noted to have a large abscess which was cultured. A shim for repeat irrigation and debridement 04/05/2017. ID consulted for antibiotic management.     Assessment & Plan:   Principal Problem:   Sepsis ([place]) Active Problems:   Necrotizing fasciitis ([place])   HTN (hypertension)   Hyperglycemia due to type 2 diabetes mellitus ([place])   Acute kidney injury superimposed on chronic kidney disease (HCC)   Hypoalbuminemia   Right leg DVT (HCC)   Diabetic ulcer of calf ([place])   Noncompliance w/medication treatment due to intermit use of medication   Cellulitis of right lower leg   Sinus tachycardia   Fever   Chronic anticoagulation   Cellulitis of right lower extremity   Diabetic ulcer of ankle ([employer])   ARF (acute renal failure) ([place])  1. Sepsis - secondary to cellulitis and infection of right leg calf wound/necrotizing fasciitis - fever curve trended down. Leukocytosis trending down now. Lactic acid trended down. Patient status post wound debridement and debridement of massive deep wound abscess 04/03/2017 with cultures consistent with MSSA and Escherichia coli. Wound cultures positive for abundant Escherichia coli. Patient s/p irrigation and debridement 04/05/2017. Patient was seen in consultation by ID and antibiotics adjusted. Antibiotics changed to IV Rocephin. Patient received a dose of oral clindamycin 04/04/2017, which has been discontinued. Discontinued IV  vancomycin. IV Rocephin has been changed to IV Ancef. ID and orthopedics following.  2. Right leg wound/necrotizing fasciitis -  orthopedics s/p wound debridement 04/03/2017 with debridement of deep wound abscess with cultures of MSSA and Escherichia coli. Wound cultures are positive for abundant Escherichia coli. Patient per orthopedic with massive abscess and necrotic fascia. Patient returned to the operating room 04/05/2017, for further irrigation and debridement per orthopedics. Patient has been seen in consultation by ID and antibiotics adjusted. Changed IV  Rocephin to IV Ancef. Clindamycin was discontinued per ID. Discontinued IV vancomycin as culture results positive for MSSA and Escherichia coli sensitive to Rocephin. ID and orthopedics following and appreciate input and recommendation.   3. Poorly controlledType 2 diabetes mellitus, insulin requiring with neurological complications - Pt reports that he has not had insulin to take in over 1 year.  He recently established with a PCP and is willing to take insulin again. Hemoglobin A1c greater than 15.5.  Basal lantus insulin ordered. TSH normal.  CBG 113 - 148. Patient currently on Lantus 30 units daily, meal coverage insulin 10 units 3 times daily as well as sliding scale insulin. Will keep patient on Lantus for now as patient is going back and forth to the operating room. Changed moderate sliding scale insulin to resistant sliding scale insulin.  Postoperatively and when no further debridement going to be done during this hospitalization then will transition to 70/30 regimen and titrate as needed as patient will likely be discharged home on this. Patient states he was on 30 units of 70/30 in the morning and 18 units at bedtime.  4. Acute Kidney Injury on CKD (suspected) -  suspect he has CKD from uncontrolled hypertension and diabetes mellitus.  IV fluids ordered for hydration, avoid nephrotoxins, follow renal function panel. Renal function seems to be  plateauing. Renal ultrasound negative for any hydronephrosis or medical renal disease. Urine protein to creatinine ratio pending. Will likely need outpatient follow-up. 5. Essential hypertension - poorly controlled likely secondary to noncompliance. Blood pressure control improving. Continue Norvasc 10 mg daily. Increase metoprolol to 100 mg twice daily. 6. DVT of RLE - (acute) He was on xarelto and saw hematology recently and they are suggesting he be on anticoagulation indefinitely given his risk factors for DVT.  He will follow up in 6 months with hematology to discuss further. Patient going back and forth to the OR and as such Xarelto still on hold. Full dose Lovenox on hold per orthopedics for the next 2-3 days postop, for now while patient is going back and forth to the operating room. Will resume lovenox and discuss it with orthopedics. 7. Malnutrition of chronic disease - awaiting dietitian to reassess patient as patient was not engaged during last visit. 8. Hypokalemia--repleted.    DVT prophylaxis: Lovenox. Code Status: Full Family Communication: Updated patient. No family at bedside. Disposition Plan: skilled nursing facility when medically stable and per orthopedics.   Consultants:   Orthopedics: Dr. Sharol Given 04/01/2017  Infectious diseases: Dr. Johnnye Sima 04/04/2017  Procedure  Chest x-ray 04/01/2017    Plain films of the right tib-fib 03/30/2017  ABIs 04/03/2017  Irrigation and debridement RLE, apply wound vac. Debridement of deep abscess. Per Dr. Sharol Given 04/03/2017  Irrigation and debridement right lower extremity  04/05/2017  Renal ultrasound 04/05/2017  PICC line 04/08/2017  Antimicrobials:   IV Zosyn 03/30/2017>>> 04/04/2017  IV vancomycin 03/30/2017>>>>04/06/2017  Oral clindamycin 04/04/2017>>> 04/05/2017  IV Rocephin 04/04/2017>>>> 04/08/2017  IV Ancef 04/08/2017   Subjective: Patient laying in bed. No complaints. Patient states right lower extremity pain  being managed. No chest pain. No shortness of breath.   Objective: Vitals:   04/07/17 1734 04/07/17 2022 04/08/17 0422 04/08/17 0917  BP: (!) 149/92 (!) 150/92 (!) 146/85 (!) 144/89  Pulse: 89 90 80 96  Resp: 18 18 18 18   Temp: 98.2 F (36.8 C) 98.6 F (37 C) 97.9 F (36.6 C) 98.4 F (36.9 C)  TempSrc: Oral Oral Oral Oral  SpO2: 98% 98% 99% 97%  Weight:  108.5 kg (239 lb 3.2 oz)    Height:        Intake/Output Summary (Last 24 hours) at 04/08/17 1743 Last data filed at 04/08/17 1400  Gross per 24 hour  Intake             1740 ml  Output             2400 ml  Net             -660 ml   Filed Weights   04/05/17 2011 04/06/17 2000 04/07/17 2022  Weight: 114.2 kg (251 lb 11.2 oz) 108.4 kg (239 lb) 108.5 kg (239 lb 3.2 oz)    Examination:  General exam: NAD. Respiratory system: Clear to auscultation anterior lung fields. No Crackles, no rhonchi, no wheezing.  Cardiovascular system: S1 & S2 heard, RRR. No JVD, murmurs, rubs, gallops or clicks. No pedal edema. Gastrointestinal system: Abdomen is soft, nontender, nondistended, positive bowel sounds. No organomegaly or masses felt. Normal bowel sounds heard. Central nervous system: Alert and oriented X 3. No focal neurological deficits. Extremities: Right lower extremity with wound VAC on draining bloody drainage. Skin:  No rashes, lesions or ulcers Psychiatry: Judgement and insight appear normal. Mood & affect appropriate.     Data Reviewed: I have personally reviewed following labs and imaging studies  CBC:  Recent Labs Lab 04/02/17 0713 04/03/17 0332 04/04/17 0236 04/05/17 0311 04/06/17 0250 04/07/17 0257 04/08/17 0237  WBC 15.8* 13.9* 12.2* 12.2* 9.1 9.0 9.0  NEUTROABS 12.2* 10.6* 11.0* 8.2* 5.7  --   --   HGB 12.0* 12.4* 11.9* 11.5* 10.6* 10.0* 9.3*  HCT 37.2* 38.6* 37.2* 35.4* 32.8* 31.8* 29.2*  MCV 88.8 88.1 88.6 87.4 89.1 89.8 89.6  PLT 357 378 408* 474* 442* 435* 938*   Basic Metabolic Panel:  Recent  Labs Lab 04/03/17 0322  04/04/17 0236 04/05/17 0311 04/06/17 0250 04/07/17 0257 04/08/17 0237  NA  --   < > 134* 133* 136 138 136  K  --   < > 4.8 4.3 3.8 4.1 4.1  CL  --   < > 102 101 102 103 104  CO2  --   < > 22 25 27 30 26   GLUCOSE  --   < > 299* 291* 190* 167* 158*  BUN  --   < > 21* 31* 23* 20 19  CREATININE  --   < > 1.77* 1.75* 1.58* 1.61* 1.49*  CALCIUM  --   < > 8.3* 8.4* 8.4* 8.5* 8.2*  MG 1.6*  --  2.2  --   --   --   --   < > = values in this interval not displayed. GFR: Estimated Creatinine Clearance: 79.1 mL/min (A) (by C-G formula based on SCr of 1.49 mg/dL (H)). Liver Function Tests:  Recent Labs Lab 04/02/17 0713 04/03/17 0332 04/04/17 0236 04/05/17 0311  AST 11* 14* 18 14*  ALT 13* 11* 14* 14*  ALKPHOS 72 69 72 64  BILITOT 0.8 0.7 0.7 0.4  PROT 6.7 6.9 7.3 6.7  ALBUMIN 1.7* 1.7* 1.6* 1.7*   No results for input(s): LIPASE, AMYLASE in the last 168 hours. No results for input(s): AMMONIA in the last 168 hours. Coagulation Profile: No results for input(s): INR, PROTIME in the last 168 hours. Cardiac Enzymes: No results for input(s): CKTOTAL, CKMB, CKMBINDEX, TROPONINI in the last 168 hours. BNP (last 3 results) No results for input(s): PROBNP in the last 8760 hours. HbA1C: No results for input(s): HGBA1C in the last 72 hours. CBG:  Recent Labs Lab 04/08/17 0000 04/08/17 0420 04/08/17 0751 04/08/17 1254 04/08/17 1620  GLUCAP 170* 151* 148* 139* 113*   Lipid Profile: No results for input(s): CHOL, HDL, LDLCALC, TRIG, CHOLHDL, LDLDIRECT in the last 72 hours. Thyroid Function Tests: No results for input(s): TSH, T4TOTAL, FREET4, T3FREE, THYROIDAB in the last 72 hours. Anemia Panel: No results for input(s): VITAMINB12, FOLATE, FERRITIN, TIBC, IRON, RETICCTPCT in the last 72 hours. Sepsis Labs: No results for input(s): PROCALCITON, LATICACIDVEN in the last 168 hours.  Recent Results (from the past 240 hour(s))  Blood culture (routine x 2)      Status: None   Collection Time: 03/30/17  7:02 PM  Result Value Ref Range Status   Specimen Description BLOOD LEFT FOREARM  Final   Special Requests   Final    BOTTLES DRAWN AEROBIC AND ANAEROBIC Blood Culture adequate volume   Culture NO GROWTH 5 DAYS  Final   Report Status 04/04/2017 FINAL  Final  Blood culture (routine x 2)     Status: None   Collection Time: 03/30/17  7:43 PM  Result Value Ref  Range Status   Specimen Description BLOOD RIGHT ANTECUBITAL  Final   Special Requests   Final    BOTTLES DRAWN AEROBIC AND ANAEROBIC Blood Culture adequate volume   Culture NO GROWTH 5 DAYS  Final   Report Status 04/04/2017 FINAL  Final  Aerobic Culture (superficial specimen)     Status: None   Collection Time: 03/30/17 11:23 PM  Result Value Ref Range Status   Specimen Description WOUND RIGHT LEG  Final   Special Requests Normal  Final   Gram Stain   Final    RARE WBC PRESENT, PREDOMINANTLY PMN RARE GRAM POSITIVE COCCI IN PAIRS IN CLUSTERS RARE GRAM NEGATIVE RODS    Culture ABUNDANT ESCHERICHIA COLI  Final   Report Status 04/02/2017 FINAL  Final   Organism ID, Bacteria ESCHERICHIA COLI  Final      Susceptibility   Escherichia coli - MIC*    AMPICILLIN >=32 RESISTANT Resistant     CEFAZOLIN 16 SENSITIVE Sensitive     CEFEPIME <=1 SENSITIVE Sensitive     CEFTAZIDIME <=1 SENSITIVE Sensitive     CEFTRIAXONE <=1 SENSITIVE Sensitive     CIPROFLOXACIN <=0.25 SENSITIVE Sensitive     GENTAMICIN <=1 SENSITIVE Sensitive     IMIPENEM <=0.25 SENSITIVE Sensitive     TRIMETH/SULFA >=320 RESISTANT Resistant     AMPICILLIN/SULBACTAM >=32 RESISTANT Resistant     PIP/TAZO <=4 SENSITIVE Sensitive     Extended ESBL NEGATIVE Sensitive     * ABUNDANT ESCHERICHIA COLI  Aerobic/Anaerobic Culture (surgical/deep wound)     Status: None   Collection Time: 04/03/17  4:49 PM  Result Value Ref Range Status   Specimen Description ABSCESS  Final   Special Requests RT LATERAL CALF  Final   Gram Stain    Final    ABUNDANT WBC PRESENT, PREDOMINANTLY PMN FEW GRAM POSITIVE COCCI IN CLUSTERS RARE GRAM NEGATIVE RODS Gram Stain Report Called to,Read Back By and Verified With: A Sheryn Bison RN [phone] 04/03/17 A BROWNING    Culture   Final    ABUNDANT STAPHYLOCOCCUS AUREUS ABUNDANT ESCHERICHIA COLI NO ANAEROBES ISOLATED    Report Status 04/08/2017 FINAL  Final   Organism ID, Bacteria STAPHYLOCOCCUS AUREUS  Final   Organism ID, Bacteria ESCHERICHIA COLI  Final      Susceptibility   Escherichia coli - MIC*    AMPICILLIN >=32 RESISTANT Resistant     CEFAZOLIN <=4 SENSITIVE Sensitive     CEFEPIME <=1 SENSITIVE Sensitive     CEFTAZIDIME <=1 SENSITIVE Sensitive     CEFTRIAXONE <=1 SENSITIVE Sensitive     CIPROFLOXACIN <=0.25 SENSITIVE Sensitive     GENTAMICIN <=1 SENSITIVE Sensitive     IMIPENEM <=0.25 SENSITIVE Sensitive     TRIMETH/SULFA >=320 RESISTANT Resistant     AMPICILLIN/SULBACTAM >=32 RESISTANT Resistant     PIP/TAZO <=4 SENSITIVE Sensitive     Extended ESBL NEGATIVE Sensitive     * ABUNDANT ESCHERICHIA COLI   Staphylococcus aureus - MIC*    CIPROFLOXACIN <=0.5 SENSITIVE Sensitive     ERYTHROMYCIN <=0.25 SENSITIVE Sensitive     GENTAMICIN <=0.5 SENSITIVE Sensitive     OXACILLIN 0.5 SENSITIVE Sensitive     TETRACYCLINE <=1 SENSITIVE Sensitive     VANCOMYCIN <=0.5 SENSITIVE Sensitive     TRIMETH/SULFA <=10 SENSITIVE Sensitive     CLINDAMYCIN <=0.25 SENSITIVE Sensitive     RIFAMPIN <=0.5 SENSITIVE Sensitive     Inducible Clindamycin NEGATIVE Sensitive     * ABUNDANT STAPHYLOCOCCUS AUREUS  Nasopharyngeal Culture  Status: None   Collection Time: 04/05/17  6:55 AM  Result Value Ref Range Status   Specimen Description NASOPHARYNGEAL  Final   Special Requests NONE  Final   Culture RARE NORMAL NASOPHARYNGEAL FLORA  Final   Report Status 04/07/2017 FINAL  Final         Radiology Studies: No results found.      Scheduled Meds: . amLODipine  10 mg Oral Daily  . docusate  sodium  100 mg Oral BID  . enoxaparin (LOVENOX) injection  110 mg Subcutaneous Q12H  . feeding supplement (PRO-STAT SUGAR FREE 64)  30 mL Oral TID BM  . insulin aspart  0-20 Units Subcutaneous TID WC  . insulin aspart  0-5 Units Subcutaneous QHS  . insulin aspart  10 Units Subcutaneous TID WC  . insulin glargine  30 Units Subcutaneous Daily  . living well with diabetes book   Does not apply Once  . metoprolol tartrate  100 mg Oral BID  . polyethylene glycol  17 g Oral Daily  . senna-docusate  1 tablet Oral BID   Continuous Infusions: . sodium chloride    . sodium chloride 10 mL/hr at 04/08/17 1345  .  ceFAZolin (ANCEF) IV    . lactated ringers 50 mL/hr at 04/03/17 1616  . lactated ringers 10 mL/hr at 04/05/17 1045  . methocarbamol (ROBAXIN)  IV       LOS: 9 days    Time spent: 55 mins    THOMPSON,DANIEL, MD [facility] Pager [phone]  If 7PM-7AM, please contact night-coverage [email or web address] Password [facility] 04/08/2017, 5:43 PM

## 2017-04-08 NOTE — Progress Notes (Signed)
Peripherally Inserted Central Catheter/Midline Placement  The IV Nurse has discussed with the patient and/or persons authorized to consent for the patient, the purpose of this procedure and the potential benefits and risks involved with this procedure.  The benefits include less needle sticks, lab draws from the catheter, and the patient may be discharged home with the catheter. Risks include, but not limited to, infection, bleeding, blood clot (thrombus formation), and puncture of an artery; nerve damage and irregular heartbeat and possibility to perform a PICC exchange if needed/ordered by physician.  Alternatives to this procedure were also discussed.  Bard Power PICC patient education guide, fact sheet on infection prevention and patient information card has been provided to patient /or left at bedside.    PICC/Midline Placement Documentation        Synthia Innocent 04/08/2017, 12:18 PM

## 2017-04-08 NOTE — Progress Notes (Signed)
INFECTIOUS DISEASE PROGRESS NOTE  ID: CADEL STAIRS is a 46 y.o. male with  Principal Problem:   Sepsis ([place]) Active Problems:   HTN (hypertension)   Hyperglycemia due to type 2 diabetes mellitus ([place])   Acute kidney injury superimposed on chronic kidney disease ([place])   Hypoalbuminemia   Right leg DVT ([place])   Diabetic ulcer of calf ([place])   Noncompliance w/medication treatment due to intermit use of medication   Cellulitis of right lower leg   Sinus tachycardia   Fever   Chronic anticoagulation   Necrotizing fasciitis ([place])   Cellulitis of right lower extremity   Diabetic ulcer of ankle (HCC)   ARF (acute renal failure) (HCC)  Subjective: No complaints  Abtx:  Anti-infectives    Start     Dose/Rate Route Frequency Ordered Stop   04/08/17 1600  ceFAZolin (ANCEF) IVPB 2g/100 mL premix     2 g 200 mL/hr over 30 Minutes Intravenous Every 8 hours 04/08/17 0944     04/04/17 1700  cefTRIAXone (ROCEPHIN) 2 g in dextrose 5 % 50 mL IVPB  Status:  Discontinued     2 g 100 mL/hr over 30 Minutes Intravenous Every 24 hours 04/04/17 1459 04/08/17 0944   04/04/17 1530  clindamycin (CLEOCIN) capsule 600 mg     600 mg Oral Every 8 hours 04/04/17 1505 04/05/17 0800   04/01/17 2200  vancomycin (VANCOCIN) 1,250 mg in sodium chloride 0.9 % 250 mL IVPB  Status:  Discontinued     1,250 mg 166.7 mL/hr over 90 Minutes Intravenous Every 24 hours 04/01/17 0805 04/06/17 1117   03/31/17 2100  vancomycin (VANCOCIN) 1,500 mg in sodium chloride 0.9 % 500 mL IVPB  Status:  Discontinued     1,500 mg 250 mL/hr over 120 Minutes Intravenous Every 12 hours 03/31/17 1257 04/01/17 0805   03/31/17 0900  vancomycin (VANCOCIN) IVPB 1000 mg/200 mL premix  Status:  Discontinued     1,000 mg 200 mL/hr over 60 Minutes Intravenous Every 12 hours 03/30/17 2046 03/31/17 1257   03/31/17 0600  piperacillin-tazobactam (ZOSYN) IVPB 3.375 g  Status:  Discontinued     3.375 g 12.5 mL/hr over 240 Minutes Intravenous  Every 8 hours 03/30/17 2046 04/04/17 1459   03/30/17 2045  piperacillin-tazobactam (ZOSYN) IVPB 3.375 g     3.375 g 100 mL/hr over 30 Minutes Intravenous  Once 03/30/17 2037 03/30/17 2114   03/30/17 2045  vancomycin (VANCOCIN) IVPB 1000 mg/200 mL premix  Status:  Discontinued     1,000 mg 200 mL/hr over 60 Minutes Intravenous  Once 03/30/17 2037 03/30/17 2042   03/30/17 2045  vancomycin (VANCOCIN) 1,500 mg in sodium chloride 0.9 % 500 mL IVPB     1,500 mg 250 mL/hr over 120 Minutes Intravenous To Emergency Dept 03/30/17 2042 03/30/17 2311      Medications:  Scheduled: . amLODipine  10 mg Oral Daily  . docusate sodium  100 mg Oral BID  . feeding supplement (PRO-STAT SUGAR FREE 64)  30 mL Oral BID  . insulin aspart  0-20 Units Subcutaneous TID WC  . insulin aspart  0-5 Units Subcutaneous QHS  . insulin aspart  10 Units Subcutaneous TID WC  . insulin glargine  30 Units Subcutaneous Daily  . living well with diabetes book   Does not apply Once  . metoprolol tartrate  100 mg Oral BID  . polyethylene glycol  17 g Oral Daily  . senna-docusate  1 tablet Oral BID    Objective:  Vital signs in last 24 hours: Temp:  [97.9 F (36.6 C)-98.6 F (37 C)] 98.4 F (36.9 C) (09/24 0917) Pulse Rate:  [80-96] 96 (09/24 0917) Resp:  [18] 18 (09/24 0917) BP: (144-150)/(85-92) 144/89 (09/24 0917) SpO2:  [97 %-99 %] 97 % (09/24 0917) Weight:  [108.5 kg (239 lb 3.2 oz)] 108.5 kg (239 lb 3.2 oz) (09/23 2022)   General appearance: alert and no distress Resp: clear to auscultation bilaterally Cardio: regular rate and rhythm GI: normal findings: bowel sounds normal and soft, non-tender Extremities: VAC on LE.   Lab Results  Recent Labs  04/07/17 0257 04/08/17 0237  WBC 9.0 9.0  HGB 10.0* 9.3*  HCT 31.8* 29.2*  NA 138 136  K 4.1 4.1  CL 103 104  CO2 30 26  BUN 20 19  CREATININE 1.61* 1.49*   Liver Panel No results for input(s): PROT, ALBUMIN, AST, ALT, ALKPHOS, BILITOT, BILIDIR, IBILI  in the last 72 hours. Sedimentation Rate No results for input(s): ESRSEDRATE in the last 72 hours. C-Reactive Protein No results for input(s): CRP in the last 72 hours.  Microbiology: Recent Results (from the past 240 hour(s))  Blood culture (routine x 2)     Status: None   Collection Time: 03/30/17  7:02 PM  Result Value Ref Range Status   Specimen Description BLOOD LEFT FOREARM  Final   Special Requests   Final    BOTTLES DRAWN AEROBIC AND ANAEROBIC Blood Culture adequate volume   Culture NO GROWTH 5 DAYS  Final   Report Status 04/04/2017 FINAL  Final  Blood culture (routine x 2)     Status: None   Collection Time: 03/30/17  7:43 PM  Result Value Ref Range Status   Specimen Description BLOOD RIGHT ANTECUBITAL  Final   Special Requests   Final    BOTTLES DRAWN AEROBIC AND ANAEROBIC Blood Culture adequate volume   Culture NO GROWTH 5 DAYS  Final   Report Status 04/04/2017 FINAL  Final  Aerobic Culture (superficial specimen)     Status: None   Collection Time: 03/30/17 11:23 PM  Result Value Ref Range Status   Specimen Description WOUND RIGHT LEG  Final   Special Requests Normal  Final   Gram Stain   Final    RARE WBC PRESENT, PREDOMINANTLY PMN RARE GRAM POSITIVE COCCI IN PAIRS IN CLUSTERS RARE GRAM NEGATIVE RODS    Culture ABUNDANT ESCHERICHIA COLI  Final   Report Status 04/02/2017 FINAL  Final   Organism ID, Bacteria ESCHERICHIA COLI  Final      Susceptibility   Escherichia coli - MIC*    AMPICILLIN >=32 RESISTANT Resistant     CEFAZOLIN 16 SENSITIVE Sensitive     CEFEPIME <=1 SENSITIVE Sensitive     CEFTAZIDIME <=1 SENSITIVE Sensitive     CEFTRIAXONE <=1 SENSITIVE Sensitive     CIPROFLOXACIN <=0.25 SENSITIVE Sensitive     GENTAMICIN <=1 SENSITIVE Sensitive     IMIPENEM <=0.25 SENSITIVE Sensitive     TRIMETH/SULFA >=320 RESISTANT Resistant     AMPICILLIN/SULBACTAM >=32 RESISTANT Resistant     PIP/TAZO <=4 SENSITIVE Sensitive     Extended ESBL NEGATIVE Sensitive      * ABUNDANT ESCHERICHIA COLI  Aerobic/Anaerobic Culture (surgical/deep wound)     Status: None (Preliminary result)   Collection Time: 04/03/17  4:49 PM  Result Value Ref Range Status   Specimen Description ABSCESS  Final   Special Requests RT LATERAL CALF  Final   Gram Stain   Final  ABUNDANT WBC PRESENT, PREDOMINANTLY PMN FEW GRAM POSITIVE COCCI IN CLUSTERS RARE GRAM NEGATIVE RODS Gram Stain Report Called to,Read Back By and Verified With: A Sheryn Bison RN [phone] 04/03/17 A BROWNING    Culture   Final    ABUNDANT STAPHYLOCOCCUS AUREUS ABUNDANT ESCHERICHIA COLI NO ANAEROBES ISOLATED; CULTURE IN PROGRESS FOR 5 DAYS    Report Status PENDING  Incomplete   Organism ID, Bacteria STAPHYLOCOCCUS AUREUS  Final   Organism ID, Bacteria ESCHERICHIA COLI  Final      Susceptibility   Escherichia coli - MIC*    AMPICILLIN >=32 RESISTANT Resistant     CEFAZOLIN <=4 SENSITIVE Sensitive     CEFEPIME <=1 SENSITIVE Sensitive     CEFTAZIDIME <=1 SENSITIVE Sensitive     CEFTRIAXONE <=1 SENSITIVE Sensitive     CIPROFLOXACIN <=0.25 SENSITIVE Sensitive     GENTAMICIN <=1 SENSITIVE Sensitive     IMIPENEM <=0.25 SENSITIVE Sensitive     TRIMETH/SULFA >=320 RESISTANT Resistant     AMPICILLIN/SULBACTAM >=32 RESISTANT Resistant     PIP/TAZO <=4 SENSITIVE Sensitive     Extended ESBL NEGATIVE Sensitive     * ABUNDANT ESCHERICHIA COLI   Staphylococcus aureus - MIC*    CIPROFLOXACIN <=0.5 SENSITIVE Sensitive     ERYTHROMYCIN <=0.25 SENSITIVE Sensitive     GENTAMICIN <=0.5 SENSITIVE Sensitive     OXACILLIN 0.5 SENSITIVE Sensitive     TETRACYCLINE <=1 SENSITIVE Sensitive     VANCOMYCIN <=0.5 SENSITIVE Sensitive     TRIMETH/SULFA <=10 SENSITIVE Sensitive     CLINDAMYCIN <=0.25 SENSITIVE Sensitive     RIFAMPIN <=0.5 SENSITIVE Sensitive     Inducible Clindamycin NEGATIVE Sensitive     * ABUNDANT STAPHYLOCOCCUS AUREUS  Nasopharyngeal Culture     Status: None   Collection Time: 04/05/17  6:55 AM  Result Value  Ref Range Status   Specimen Description NASOPHARYNGEAL  Final   Special Requests NONE  Final   Culture RARE NORMAL NASOPHARYNGEAL FLORA  Final   Report Status 04/07/2017 FINAL  Final    Studies/Results: No results found.   Assessment/Plan: Necrotizing fascitis (E coli MSSA) DM2 uncontrolled CKD  Total days of antibiotics: 9             Ceftriaxone (9/20--) Will change him to ancef Will place [facility] Plan on 4 weeks of therapy FSG fair control.  Will see him in ID clinic for f/u Available as needed opat consult   No Known Allergies  OPAT Orders Discharge antibiotics: ancef 2 g ivpb q8h Duration: 19 days End Date: April 26, 2018  [place] Per Protocol:  Labs weekly while on IV antibiotics: _x_ CBC with differential __ BMP _x_ CMP __ CRP __ ESR __ Vancomycin trough  _x_ Please pull PIC at completion of IV antibiotics __ Please leave PIC in place until doctor has seen patient or been notified  Fax weekly labs to [phone]  Clinic Follow Up Appt: 2-3 weeks ([place])           Bobby Rumpf Infectious Diseases (pager) [phone] [email or web address] 04/08/2017, 10:25 AM  LOS: 9 days

## 2017-04-08 NOTE — Progress Notes (Signed)
PHARMACY CONSULT NOTE FOR:  OUTPATIENT  PARENTERAL ANTIBIOTIC THERAPY (OPAT)  Indication: Wound infection/ Necrotizing fascitis   Regimen: Cefazolin 2 gm every 8 hours  End date: October 12th, 2018  IV antibiotic discharge orders are pended. To discharging provider:  please sign these orders via discharge navigator,  Select New Orders & click on the button choice - Manage This Unsigned Work.     Thank you for allowing pharmacy to be a part of this patient's care.  Susa Raring, PharmD PGY2 Infectious Diseases Pharmacy Resident Pager: [phone]  04/08/2017, 10:36 AM

## 2017-04-08 NOTE — Progress Notes (Signed)
[facility] Infusion Coordinator is following Mr. Wohlford's hospital course for IV ABX upon DC along with [facility] for [facility] services if pt DC's to home. At present SNF is the plan.  We will be on standby if needed for home.  If patient discharges after hours, please call [phone].   [name] 04/08/2017, 10:24 AM

## 2017-04-08 NOTE — Progress Notes (Signed)
ANTICOAGULATION CONSULT NOTE - Follow-Up Consult  Pharmacy Consult for Lovenox Indication: DVT  No Known Allergies  Patient Measurements: Height: 5' 11.5" (181.6 cm) Weight: 239 lb 3.2 oz (108.5 kg) IBW/kg (Calculated) : 76.45  Vital Signs: Temp: 98.4 F (36.9 C) (09/24 0917) Temp Source: Oral (09/24 0917) BP: 144/89 (09/24 0917) Pulse Rate: 96 (09/24 0917)  Labs:  Recent Labs  04/06/17 0250 04/07/17 0257 04/08/17 0237  HGB 10.6* 10.0* 9.3*  HCT 32.8* 31.8* 29.2*  PLT 442* 435* 435*  CREATININE 1.58* 1.61* 1.49*    Estimated Creatinine Clearance: 79.1 mL/min (A) (by C-G formula based on SCr of 1.49 mg/dL (H)).   Medical History: Past Medical History:  Diagnosis Date  . Diabetes mellitus without complication ([place])   . DVT of proximal lower limb ([place]) 03/20/2017   R calf area  . Gout   . Hypertension     Assessment: 32 YOM with a recent RLE DVT diagnosed on 9/5. The patient was on Xarelto PTA - this was held for plans for debridement by ortho - done 9/19 and planning again 9/21. Pharmacy has been consulted today to start full-dose Lovenox for bridging while Xarelto held.   The patient was taken to the OR on 9/21 for a repeat I&D. Post-procedure ortho was contacted regarding when restart post-op would be appropriate. Per this conversation, Dr. Sharol Given wanted to hold for 3 days post-procedure.   Per the MD's discussion with ortho Sharol Given) - plan is to resume full-dose Lovenox dosing today. No need to hold for placement of skin graft on Wed, 9/26 per ortho.  Goal of Therapy:   Anti-Xa level 0.6-1 units/ml 4hrs after LMWH dose given Monitor platelets by anticoagulation protocol: Yes   Plan:  1. Resume Lovenox 110 mg SQ every 12 hours 2. Will continue to monitor for any signs/symptoms of bleeding and renal function for any necessary dose adjustments and/or need to hold  Thank you for allowing pharmacy to be a part of this patient's care.  Alycia Rossetti, PharmD,  BCPS Clinical Pharmacist Pager: [phone] Clinical phone for 04/08/2017 from 7a-3:30p: [phone] If after 3:30p, please call main pharmacy at: [phone] 04/08/2017 11:30 AM

## 2017-04-08 NOTE — Progress Notes (Signed)
Occupational Therapy Treatment Patient Details Name: [name] MRN: [id number] DOB: [birth year] Today's Date: 04/08/2017    History of present illness [name] is a 46 y.o. male who presents with diabetic insensate neuropathy history of hypertension who is currently on Xarelto for a DVT. Patient states that he noticed a blunt trauma to the lateral aspect of his right calf about 3 months ago. Patient states she's been doing wound care. Patient states returned from a driving trip in [place] 2 weeks ago and had acute swelling of the right leg. Patient did go to emergency room and patient's Dopplers were positive for DVT in the right lower extremity popliteal fossa and patient was started on Xarelto. Patient states that since that time he's had foul-smelling drainage peeling necrotic skin and black eschar to the lateral aspect of the right calf. Plan for wound vac placement.   OT comments  This 46 yo male seen today in partial conjunction with PT to see if we could progress mobility; however pt unable to due to severe pain when RLE in dependent position. He did try to ambulate, but too painful. He stood times 2. He will continue to benefit from acute OT with follow up OT at SNF with better pain control.   Follow Up Recommendations  SNF;Supervision/Assistance - 24 hour    Equipment Recommendations  3 in 1 bedside commode       Precautions / Restrictions Precautions Precautions: Fall Precaution Comments: wound vac Restrictions Weight Bearing Restrictions: No       Mobility Bed Mobility Overal bed mobility: Needs Assistance Bed Mobility: Supine to Sit;Sit to Supine     Supine to sit: Supervision;HOB elevated Sit to supine: Supervision   General bed mobility comments: increased time due to pain  Transfers Overall transfer level: Needs assistance Equipment used: Rolling walker (2 wheeled) Transfers: Sit to/from Stand Sit to Stand: +2 safety/equipment;Min assist          General transfer comment: Attempted to take steps, only able to take 1 small one due to too painful per pt    Balance Overall balance assessment: Needs assistance Sitting-balance support: No upper extremity supported;Feet supported Sitting balance-Leahy Scale: Fair     Standing balance support: Bilateral upper extremity supported Standing balance-Leahy Scale: Poor                             ADL either performed or assessed with clinical judgement   ADL Overall ADL's : Needs assistance/impaired                     Lower Body Dressing: Moderate assistance Lower Body Dressing Details (indicate cue type and reason): for socks, can stand with min A +2 safety but cannot let go of RW for balance while standing               General ADL Comments: +2 min A for sit<>stand for safety     Vision Patient Visual Report: No change from baseline            Cognition Arousal/Alertness: Awake/alert Behavior During Therapy: WFL for tasks assessed/performed Overall Cognitive Status: Within Functional Limits for tasks assessed                                 General Comments: Willing to get up and try with [place], once up limited by pain  Pertinent Vitals/ Pain       Pain Assessment: 0-10 Pain Score: 10-Worst pain ever Pain Location: R distal LE Pain Descriptors / Indicators: Grimacing;Guarding Pain Intervention(s): Limited activity within patient's tolerance         Frequency  Min 2X/week        Progress Toward Goals  OT Goals(current goals can now be found in the care plan section)  Progress towards OT goals: Not progressing toward goals - comment (due to pain)  Acute Rehab OT Goals Patient Stated Goal: to feel better   Plan Discharge plan remains appropriate    Co-evaluation    PT/OT/SLP Co-Evaluation/Treatment: Yes (partial) Reason for Co-Treatment: To address functional/ADL transfers;Other (comment)  (Decr activity tolerance) PT goals addressed during session: Mobility/safety with mobility OT goals addressed during session: Strengthening/ROM;ADL's and self-care      AM-PAC PT "6 Clicks" Daily Activity     Outcome Measure   Help from another person eating meals?: None Help from another person taking care of personal grooming?: A Little Help from another person toileting, which includes using toliet, bedpan, or urinal?: Total Help from another person bathing (including washing, rinsing, drying)?: A Lot Help from another person to put on and taking off regular upper body clothing?: A Little Help from another person to put on and taking off regular lower body clothing?: Total 6 Click Score: 14    End of Session Equipment Utilized During Treatment: Gait belt;Rolling walker  OT Visit Diagnosis: Unsteadiness on feet (R26.81);Other abnormalities of gait and mobility (R26.89);Pain Pain - Right/Left: Right Pain - part of body: Leg   Activity Tolerance Patient limited by pain   Patient Left in bed;with call bell/phone within reach   Nurse Communication Patient requests pain meds        Time: 0355-9741 OT Time Calculation (min): 22 min  Charges: OT General Charges $OT Visit: 1 Visit OT Treatments $Self Care/Home Management : 8-22 mins  [place], OTR/L [phone] 04/08/2017

## 2017-04-08 NOTE — Progress Notes (Signed)
Patient ID: [name], male   DOB: May 18, 1971, 46 y.o.   MRN: [id number] Patient without complaints wound VAC functioning well. Plan for return to the operating room on Wednesday to place skin graft.

## 2017-04-08 NOTE — Progress Notes (Signed)
Physical Therapy Treatment Patient Details Name: [name] MRN: [id number] DOB: 1970-11-20 Today's Date: 04/08/2017    History of Present Illness [name] is a 46 y.o. male who presents with diabetic insensate neuropathy history of hypertension who is currently on Xarelto for a DVT. Patient states that he noticed a blunt trauma to the lateral aspect of his right calf about 3 months ago. Patient states she's been doing wound care. Patient states returned from a driving trip in [place] 2 weeks ago and had acute swelling of the right leg. Patient did go to emergency room and patient's Dopplers were positive for DVT in the right lower extremity popliteal fossa and patient was started on Xarelto. Patient states that since that time he's had foul-smelling drainage peeling necrotic skin and black eschar to the lateral aspect of the right calf. Plan for wound vac placement.    PT Comments    Continuing work on functional mobility and activity tolerance;  Seen in conjunction with OT to try and progress mobility; Able to take a few small steps, maintains NWB RLE, but ultimately too painful with RLE in dependent position   Follow Up Recommendations  SNF     Equipment Recommendations  Rolling walker with 5" wheels;3in1 (PT)    Recommendations for Other Services       Precautions / Restrictions Precautions Precautions: Fall Precaution Comments: wound vac Restrictions Weight Bearing Restrictions: No    Mobility  Bed Mobility Overal bed mobility: Needs Assistance Bed Mobility: Supine to Sit;Sit to Supine     Supine to sit: Supervision;HOB elevated Sit to supine: Supervision   General bed mobility comments: increased time due to pain  Transfers Overall transfer level: Needs assistance Equipment used: Rolling walker (2 wheeled) Transfers: Sit to/from Stand Sit to Stand: +2 safety/equipment;Min assist         General transfer comment: Cues for hand placement and  safety; Able to power up on LLE; heavy dependence on UEs; maintains NWB RLE  Ambulation/Gait Ambulation/Gait assistance: Min assist;+2 safety/equipment Ambulation Distance (Feet): 3 Feet (a few small steps fwd/bkwd; then small sidesteps to [facility]) Assistive device: Rolling walker (2 wheeled)       General Gait Details: Heavy dependence on UE support on RW; maintains NWB RLE taking small steps   Stairs            Wheelchair Mobility    Modified Rankin (Stroke Patients Only)       Balance Overall balance assessment: Needs assistance Sitting-balance support: No upper extremity supported;Feet supported Sitting balance-Leahy Scale: Fair     Standing balance support: Bilateral upper extremity supported Standing balance-Leahy Scale: Poor                              Cognition Arousal/Alertness: Awake/alert Behavior During Therapy: WFL for tasks assessed/performed Overall Cognitive Status: Within Functional Limits for tasks assessed                                 General Comments: Willing to get up and try with [place], once up limited by pain      Exercises Total Joint Exercises Quad Sets: AROM;Right;Left;5 reps Heel Slides: AROM;Right;Left;5 reps Straight Leg Raises: AROM;Right;5 reps    General Comments        Pertinent Vitals/Pain Pain Assessment: 0-10 Pain Score: 10-Worst pain ever Pain Location: R distal LE Pain Descriptors /  Indicators: Grimacing;Guarding Pain Intervention(s): Limited activity within patient's tolerance    Home Living                      Prior Function            PT Goals (current goals can now be found in the care plan section) Acute Rehab PT Goals Patient Stated Goal: to feel better  PT Goal Formulation: With patient Time For Goal Achievement: 04/16/17 Potential to Achieve Goals: Fair Progress towards PT goals: Progressing toward goals    Frequency    Min 3X/week      PT Plan Current  plan remains appropriate    Co-evaluation PT/OT/SLP Co-Evaluation/Treatment: Yes Reason for Co-Treatment: To address functional/ADL transfers;Other (comment) (Decr activity tolerance) PT goals addressed during session: Mobility/safety with mobility OT goals addressed during session: Strengthening/ROM;ADL's and self-care      AM-PAC PT "6 Clicks" Daily Activity  Outcome Measure  Difficulty turning over in bed (including adjusting bedclothes, sheets and blankets)?: A Little Difficulty moving from lying on back to sitting on the side of the bed? : A Little Difficulty sitting down on and standing up from a chair with arms (e.g., wheelchair, bedside commode, etc,.)?: A Lot Help needed moving to and from a bed to chair (including a wheelchair)?: A Lot Help needed walking in hospital room?: A Lot Help needed climbing 3-5 steps with a railing? : Total 6 Click Score: 13    End of Session Equipment Utilized During Treatment: Gait belt Activity Tolerance: Patient limited by pain Patient left: in bed;with call bell/phone within reach Nurse Communication: Mobility status PT Visit Diagnosis: Pain Pain - Right/Left: Right Pain - part of body: Leg     Time: 1320-1345 PT Time Calculation (min) (ACUTE ONLY): 25 min  Charges:  $Therapeutic Activity: 8-22 mins                    G Codes:       Roney Marion, PT  Acute Rehabilitation Services Pager [phone] Office [phone]    [place] 04/08/2017, 4:12 PM

## 2017-04-09 ENCOUNTER — Other Ambulatory Visit (INDEPENDENT_AMBULATORY_CARE_PROVIDER_SITE_OTHER): Payer: Self-pay | Admitting: Orthopedic Surgery

## 2017-04-09 DIAGNOSIS — M726 Necrotizing fasciitis: Secondary | ICD-10-CM

## 2017-04-09 LAB — GLUCOSE, CAPILLARY
Glucose-Capillary: 118 mg/dL — ABNORMAL HIGH (ref 65–99)
Glucose-Capillary: 125 mg/dL — ABNORMAL HIGH (ref 65–99)
Glucose-Capillary: 183 mg/dL — ABNORMAL HIGH (ref 65–99)
Glucose-Capillary: 89 mg/dL (ref 65–99)
Glucose-Capillary: 147 mg/dL — ABNORMAL HIGH (ref 65–99)

## 2017-04-09 LAB — CBC
HCT: 29.7 % — ABNORMAL LOW (ref 39.0–52.0)
Hemoglobin: 9.4 g/dL — ABNORMAL LOW (ref 13.0–17.0)
MCH: 27.9 pg (ref 26.0–34.0)
MCHC: 31.6 g/dL (ref 30.0–36.0)
MCV: 88.1 fL (ref 78.0–100.0)
Platelets: 446 10*3/uL — ABNORMAL HIGH (ref 150–400)
RBC: 3.37 MIL/uL — ABNORMAL LOW (ref 4.22–5.81)
RDW: 12.2 % (ref 11.5–15.5)
WBC: 6.2 10*3/uL (ref 4.0–10.5)

## 2017-04-09 LAB — BASIC METABOLIC PANEL
Anion gap: 9 (ref 5–15)
BUN: 19 mg/dL (ref 6–20)
CO2: 24 mmol/L (ref 22–32)
Calcium: 8.4 mg/dL — ABNORMAL LOW (ref 8.9–10.3)
Chloride: 103 mmol/L (ref 101–111)
Creatinine, Ser: 1.43 mg/dL — ABNORMAL HIGH (ref 0.61–1.24)
GFR calc Af Amer: >60 mL/min (ref >60)
GFR calc non Af Amer: 58 mL/min — ABNORMAL LOW (ref >60)
Glucose, Bld: 119 mg/dL — ABNORMAL HIGH (ref 65–99)
Potassium: 3.7 mmol/L (ref 3.5–5.1)
Sodium: 136 mmol/L (ref 135–145)

## 2017-04-09 NOTE — Clinical Social Work Note (Signed)
CSW continuing to monitor patient progress and will provided SW intervention services as needed through discharge to a skilled facility.  Nahome Bublitz Givens, MSW, LCSW Licensed Clinical Social Worker [place] [phone]

## 2017-04-09 NOTE — Progress Notes (Signed)
PROGRESS NOTE    [name]  BZJ:[id number] DOB: 03/24/1971 DOA: 03/30/2017 PCP: Tracie Harrier, MD    Brief Narrative:  46 y/o male with poorly controlled insulin requiring diabetes, HTN, gout, CKD, recently diagnosed DVT RLE on xarelto who presented to ED with fever and poorly healing right calf leg wound. Patient also noted to have poor control of his diabetes and hypertension. Patient seen by orthopedics, Dr. Sharol Given patient taken to the operating room for irrigation and debridement as well as wound VAC placement patient noted to have a large abscess which was cultured. A shim for repeat irrigation and debridement 04/05/2017. ID consulted for antibiotic management.     Assessment & Plan:   Principal Problem:   Sepsis ([place]) Active Problems:   Necrotizing fasciitis ([place])   HTN (hypertension)   Hyperglycemia due to type 2 diabetes mellitus ([place])   Acute kidney injury superimposed on chronic kidney disease (HCC)   Hypoalbuminemia   Right leg DVT (H[facility])   Diabetic ulcer of calf ([place])   Noncompliance w/medication treatment due to intermit use of medication   Cellulitis of right lower leg   Sinus tachycardia   Fever   Chronic anticoagulation   Cellulitis of right lower extremity   Diabetic ulcer of ankle ([place])   ARF (acute renal failure) ([place])  1. Sepsis - secondary to cellulitis and infection of right leg calf wound/necrotizing fasciitis - fever curve trended down. Leukocytosis trending down now. Lactic acid trended down. Patient status post wound debridement and debridement of massive deep wound abscess 04/03/2017 with cultures consistent with MSSA and Escherichia coli. Wound cultures positive for abundant Escherichia coli. Patient s/p irrigation and debridement 04/05/2017. Patient was seen in consultation by ID and antibiotics adjusted. Antibiotics changed to IV Rocephin. Patient received a dose of oral clindamycin 04/04/2017, which has been discontinued. Discontinued IV  vancomycin. IV Rocephin has been changed to IV Ancef. ID and orthopedics following.  2. Right leg wound/necrotizing fasciitis -  orthopedics s/p wound debridement 04/03/2017 with debridement of deep wound abscess with cultures of MSSA and Escherichia coli. Wound cultures are positive for abundant Escherichia coli. Patient per orthopedic with massive abscess and necrotic fascia. Patient returned to the operating room 04/05/2017, for further irrigation and debridement per orthopedics. Patient has been seen in consultation by ID and antibiotics adjusted. Changed IV  Rocephin to IV Ancef. Clindamycin was discontinued per ID. Discontinued IV vancomycin as culture results positive for MSSA and Escherichia coli sensitive to Rocephin. ID and orthopedics following and appreciate input and recommendation.   per ID patient will need antibiotics through 04/26/2018. 3. Poorly controlledType 2 diabetes mellitus, insulin requiring with neurological complications - Pt reports that he has not had insulin to take in over 1 year.  He recently established with a PCP and is willing to take insulin again. Hemoglobin A1c greater than 15.5.  Basal lantus insulin ordered. TSH normal.  CBG 118 - 183. Patient currently on Lantus 30 units daily, meal coverage insulin 10 units 3 times daily as well as sliding scale insulin. Will keep patient on Lantus for now as patient is going back and forth to the operating room. Continue resistant sliding scale insulin.  Postoperatively and when no further debridement going to be done during this hospitalization then will transition to 70/30 regimen and titrate as needed as patient will likely be discharged home on this. Patient states he was on 30 units of 70/30 in the morning and 18 units at bedtime.  4. Acute Kidney Injury  on CKD (suspected) - suspect he may have CKD from uncontrolled hypertension and diabetes mellitus.  IV fluids ordered for hydration, avoid nephrotoxins, follow renal function panel.  Renal function seems to be trending down and slowly improving. Renal ultrasound negative for any hydronephrosis or medical renal disease. Urine protein to creatinine ratio pending. Will likely need outpatient follow-up. 5. Essential hypertension - poorly controlled likely secondary to noncompliance. Blood pressure control improved. Continue Norvasc 10 mg daily and metoprolol to 100 mg twice daily. 6. DVT of RLE - (acute) He was on xarelto and saw hematology recently and they are suggesting he be on anticoagulation indefinitely given his risk factors for DVT.  He will follow up in 6 months with hematology to discuss further. Patient going back and forth to the OR and as such Xarelto still on hold. Full dose Lovenox initially held after the second I and D. Lovenox resumed after discussion with orthopedics.  7. Malnutrition of chronic disease - awaiting dietitian to reassess patient as patient was not engaged during last visit. 8. Hypokalemia--repleted.    DVT prophylaxis: Lovenox. Code Status: Full Family Communication: Updated patient and family at bedside. Disposition Plan: skilled nursing facility when medically stable and per orthopedics.   Consultants:   Orthopedics: Dr. Sharol Given 04/01/2017  Infectious diseases: Dr. Johnnye Sima 04/04/2017  Procedure  Chest x-ray 04/01/2017    Plain films of the right tib-fib 03/30/2017  ABIs 04/03/2017  Irrigation and debridement RLE, apply wound vac. Debridement of deep abscess. Per Dr. Sharol Given 04/03/2017  Irrigation and debridement right lower extremity  04/05/2017  Renal ultrasound 04/05/2017  PICC line 04/08/2017  Antimicrobials:   IV Zosyn 03/30/2017>>> 04/04/2017  IV vancomycin 03/30/2017>>>>04/06/2017  Oral clindamycin 04/04/2017>>> 04/05/2017  IV Rocephin 04/04/2017>>>> 04/08/2017  IV Ancef 04/08/2017   Subjective: Patient in bed. No chest pain. No shortness of breath. Right lower extremity pain controlled.   Objective: Vitals:    04/08/17 0422 04/08/17 0917 04/08/17 1650 04/09/17 0300  BP: (!) 146/85 (!) 144/89 (!) 143/90 (!) 142/91  Pulse: 80 96 83 80  Resp: 18 18 17 18   Temp: 97.9 F (36.6 C) 98.4 F (36.9 C) 98.9 F (37.2 C) 98 F (36.7 C)  TempSrc: Oral Oral Oral Oral  SpO2: 99% 97% 99% 100%  Weight:      Height:        Intake/Output Summary (Last 24 hours) at 04/09/17 1011 Last data filed at 04/09/17 0600  Gross per 24 hour  Intake             1260 ml  Output             1600 ml  Net             -340 ml   Filed Weights   04/05/17 2011 04/06/17 2000 04/07/17 2022  Weight: 114.2 kg (251 lb 11.2 oz) 108.4 kg (239 lb) 108.5 kg (239 lb 3.2 oz)    Examination:  General exam: In bed.  Respiratory system: Lungs clear to auscultation bilaterally. No wheezes or crackles no rhonchi.   Cardiovascular system: S1 & S2 heard, RRR. No JVD, murmurs, rubs, gallops or clicks. No pedal edema. Gastrointestinal system: Abdomen is soft, nontender, nondistended, positive bowel sounds. No organomegaly or masses felt. Normal bowel sounds heard. Central nervous system: Alert and oriented X 3. No focal neurological deficits. Extremities: Right lower extremity with wound VAC on draining bloody drainage. Skin: No rashes, lesions or ulcers Psychiatry: Judgement and insight appear normal. Mood & affect appropriate.  Data Reviewed: I have personally reviewed following labs and imaging studies  CBC:  Recent Labs Lab 04/03/17 0332 04/04/17 0236 04/05/17 0311 04/06/17 0250 04/07/17 0257 04/08/17 0237 04/09/17 0403  WBC 13.9* 12.2* 12.2* 9.1 9.0 9.0 6.2  NEUTROABS 10.6* 11.0* 8.2* 5.7  --   --   --   HGB 12.4* 11.9* 11.5* 10.6* 10.0* 9.3* 9.4*  HCT 38.6* 37.2* 35.4* 32.8* 31.8* 29.2* 29.7*  MCV 88.1 88.6 87.4 89.1 89.8 89.6 88.1  PLT 378 408* 474* 442* 435* 435* 962*   Basic Metabolic Panel:  Recent Labs Lab 04/03/17 0322  04/04/17 0236 04/05/17 0311 04/06/17 0250 04/07/17 0257 04/08/17 0237  04/09/17 0403  NA  --   < > 134* 133* 136 138 136 136  K  --   < > 4.8 4.3 3.8 4.1 4.1 3.7  CL  --   < > 102 101 102 103 104 103  CO2  --   < > 22 25 27 30 26 24   GLUCOSE  --   < > 299* 291* 190* 167* 158* 119*  BUN  --   < > 21* 31* 23* 20 19 19   CREATININE  --   < > 1.77* 1.75* 1.58* 1.61* 1.49* 1.43*  CALCIUM  --   < > 8.3* 8.4* 8.4* 8.5* 8.2* 8.4*  MG 1.6*  --  2.2  --   --   --   --   --   < > = values in this interval not displayed. GFR: Estimated Creatinine Clearance: 82.4 mL/min (A) (by C-G formula based on SCr of 1.43 mg/dL (H)). Liver Function Tests:  Recent Labs Lab 04/03/17 0332 04/04/17 0236 04/05/17 0311  AST 14* 18 14*  ALT 11* 14* 14*  ALKPHOS 69 72 64  BILITOT 0.7 0.7 0.4  PROT 6.9 7.3 6.7  ALBUMIN 1.7* 1.6* 1.7*   No results for input(s): LIPASE, AMYLASE in the last 168 hours. No results for input(s): AMMONIA in the last 168 hours. Coagulation Profile: No results for input(s): INR, PROTIME in the last 168 hours. Cardiac Enzymes: No results for input(s): CKTOTAL, CKMB, CKMBINDEX, TROPONINI in the last 168 hours. BNP (last 3 results) No results for input(s): PROBNP in the last 8760 hours. HbA1C: No results for input(s): HGBA1C in the last 72 hours. CBG:  Recent Labs Lab 04/08/17 1254 04/08/17 1620 04/08/17 2007 04/09/17 0008 04/09/17 0802  GLUCAP 139* 113* 111* 118* 125*   Lipid Profile: No results for input(s): CHOL, HDL, LDLCALC, TRIG, CHOLHDL, LDLDIRECT in the last 72 hours. Thyroid Function Tests: No results for input(s): TSH, T4TOTAL, FREET4, T3FREE, THYROIDAB in the last 72 hours. Anemia Panel: No results for input(s): VITAMINB12, FOLATE, FERRITIN, TIBC, IRON, RETICCTPCT in the last 72 hours. Sepsis Labs: No results for input(s): PROCALCITON, LATICACIDVEN in the last 168 hours.  Recent Results (from the past 240 hour(s))  Blood culture (routine x 2)     Status: None   Collection Time: 03/30/17  7:02 PM  Result Value Ref Range Status    Specimen Description BLOOD LEFT FOREARM  Final   Special Requests   Final    BOTTLES DRAWN AEROBIC AND ANAEROBIC Blood Culture adequate volume   Culture NO GROWTH 5 DAYS  Final   Report Status 04/04/2017 FINAL  Final  Blood culture (routine x 2)     Status: None   Collection Time: 03/30/17  7:43 PM  Result Value Ref Range Status   Specimen Description BLOOD RIGHT ANTECUBITAL  Final  Special Requests   Final    BOTTLES DRAWN AEROBIC AND ANAEROBIC Blood Culture adequate volume   Culture NO GROWTH 5 DAYS  Final   Report Status 04/04/2017 FINAL  Final  Aerobic Culture (superficial specimen)     Status: None   Collection Time: 03/30/17 11:23 PM  Result Value Ref Range Status   Specimen Description WOUND RIGHT LEG  Final   Special Requests Normal  Final   Gram Stain   Final    RARE WBC PRESENT, PREDOMINANTLY PMN RARE GRAM POSITIVE COCCI IN PAIRS IN CLUSTERS RARE GRAM NEGATIVE RODS    Culture ABUNDANT ESCHERICHIA COLI  Final   Report Status 04/02/2017 FINAL  Final   Organism ID, Bacteria ESCHERICHIA COLI  Final      Susceptibility   Escherichia coli - MIC*    AMPICILLIN >=32 RESISTANT Resistant     CEFAZOLIN 16 SENSITIVE Sensitive     CEFEPIME <=1 SENSITIVE Sensitive     CEFTAZIDIME <=1 SENSITIVE Sensitive     CEFTRIAXONE <=1 SENSITIVE Sensitive     CIPROFLOXACIN <=0.25 SENSITIVE Sensitive     GENTAMICIN <=1 SENSITIVE Sensitive     IMIPENEM <=0.25 SENSITIVE Sensitive     TRIMETH/SULFA >=320 RESISTANT Resistant     AMPICILLIN/SULBACTAM >=32 RESISTANT Resistant     PIP/TAZO <=4 SENSITIVE Sensitive     Extended ESBL NEGATIVE Sensitive     * ABUNDANT ESCHERICHIA COLI  Aerobic/Anaerobic Culture (surgical/deep wound)     Status: None   Collection Time: 04/03/17  4:49 PM  Result Value Ref Range Status   Specimen Description ABSCESS  Final   Special Requests RT LATERAL CALF  Final   Gram Stain   Final    ABUNDANT WBC PRESENT, PREDOMINANTLY PMN FEW GRAM POSITIVE COCCI IN  CLUSTERS RARE GRAM NEGATIVE RODS Gram Stain Report Called to,Read Back By and Verified With: A Sheryn Bison RN [phone] 04/03/17 A BROWNING    Culture   Final    ABUNDANT STAPHYLOCOCCUS AUREUS ABUNDANT ESCHERICHIA COLI NO ANAEROBES ISOLATED    Report Status 04/08/2017 FINAL  Final   Organism ID, Bacteria STAPHYLOCOCCUS AUREUS  Final   Organism ID, Bacteria ESCHERICHIA COLI  Final      Susceptibility   Escherichia coli - MIC*    AMPICILLIN >=32 RESISTANT Resistant     CEFAZOLIN <=4 SENSITIVE Sensitive     CEFEPIME <=1 SENSITIVE Sensitive     CEFTAZIDIME <=1 SENSITIVE Sensitive     CEFTRIAXONE <=1 SENSITIVE Sensitive     CIPROFLOXACIN <=0.25 SENSITIVE Sensitive     GENTAMICIN <=1 SENSITIVE Sensitive     IMIPENEM <=0.25 SENSITIVE Sensitive     TRIMETH/SULFA >=320 RESISTANT Resistant     AMPICILLIN/SULBACTAM >=32 RESISTANT Resistant     PIP/TAZO <=4 SENSITIVE Sensitive     Extended ESBL NEGATIVE Sensitive     * ABUNDANT ESCHERICHIA COLI   Staphylococcus aureus - MIC*    CIPROFLOXACIN <=0.5 SENSITIVE Sensitive     ERYTHROMYCIN <=0.25 SENSITIVE Sensitive     GENTAMICIN <=0.5 SENSITIVE Sensitive     OXACILLIN 0.5 SENSITIVE Sensitive     TETRACYCLINE <=1 SENSITIVE Sensitive     VANCOMYCIN <=0.5 SENSITIVE Sensitive     TRIMETH/SULFA <=10 SENSITIVE Sensitive     CLINDAMYCIN <=0.25 SENSITIVE Sensitive     RIFAMPIN <=0.5 SENSITIVE Sensitive     Inducible Clindamycin NEGATIVE Sensitive     * ABUNDANT STAPHYLOCOCCUS AUREUS  Nasopharyngeal Culture     Status: None   Collection Time: 04/05/17  6:55 AM  Result Value  Ref Range Status   Specimen Description NASOPHARYNGEAL  Final   Special Requests NONE  Final   Culture RARE NORMAL NASOPHARYNGEAL FLORA  Final   Report Status 04/07/2017 FINAL  Final         Radiology Studies: No results found.      Scheduled Meds: . amLODipine  10 mg Oral Daily  . docusate sodium  100 mg Oral BID  . enoxaparin (LOVENOX) injection  110 mg Subcutaneous  Q12H  . feeding supplement (PRO-STAT SUGAR FREE 64)  30 mL Oral TID BM  . insulin aspart  0-20 Units Subcutaneous TID WC  . insulin aspart  0-5 Units Subcutaneous QHS  . insulin aspart  10 Units Subcutaneous TID WC  . insulin glargine  30 Units Subcutaneous Daily  . living well with diabetes book   Does not apply Once  . metoprolol tartrate  100 mg Oral BID  . polyethylene glycol  17 g Oral Daily  . senna-docusate  1 tablet Oral BID   Continuous Infusions: . sodium chloride 10 mL/hr at 04/08/17 2303  . sodium chloride 10 mL/hr at 04/08/17 1345  .  ceFAZolin (ANCEF) IV 2 g (04/09/17 0918)  . lactated ringers 50 mL/hr at 04/03/17 1616  . lactated ringers 10 mL/hr at 04/05/17 1045  . methocarbamol (ROBAXIN)  IV       LOS: 10 days    Time spent: 100 mins    Ruqayyah Lute, MD [facility] Pager [phone]  If 7PM-7AM, please contact night-coverage [email or web address] Password [facility] 04/09/2017, 10:11 AM

## 2017-04-10 ENCOUNTER — Encounter (HOSPITAL_COMMUNITY): Payer: Self-pay | Admitting: Anesthesiology

## 2017-04-10 ENCOUNTER — Encounter (HOSPITAL_COMMUNITY)
Admission: EM | Disposition: A | Discharge status: Skilled Nursing Facility | Payer: Self-pay | Source: Home / Self Care | Attending: Internal Medicine

## 2017-04-10 ENCOUNTER — Inpatient Hospital Stay (HOSPITAL_COMMUNITY): Payer: BLUE CROSS/BLUE SHIELD | Admitting: Anesthesiology

## 2017-04-10 DIAGNOSIS — A4101 Sepsis due to Methicillin susceptible Staphylococcus aureus: Secondary | ICD-10-CM

## 2017-04-10 DIAGNOSIS — E118 Type 2 diabetes mellitus with unspecified complications: Secondary | ICD-10-CM

## 2017-04-10 HISTORY — PX: SKIN SPLIT GRAFT: SHX444

## 2017-04-10 LAB — BASIC METABOLIC PANEL
Anion gap: 9 (ref 5–15)
BUN: 20 mg/dL (ref 6–20)
CO2: 27 mmol/L (ref 22–32)
Calcium: 8.6 mg/dL — ABNORMAL LOW (ref 8.9–10.3)
Chloride: 101 mmol/L (ref 101–111)
Creatinine, Ser: 1.37 mg/dL — ABNORMAL HIGH (ref 0.61–1.24)
GFR calc Af Amer: >60 mL/min (ref >60)
GFR calc non Af Amer: >60 mL/min (ref >60)
Glucose, Bld: 119 mg/dL — ABNORMAL HIGH (ref 65–99)
Potassium: 3.7 mmol/L (ref 3.5–5.1)
Sodium: 137 mmol/L (ref 135–145)

## 2017-04-10 LAB — CBC
HCT: 30.2 % — ABNORMAL LOW (ref 39.0–52.0)
Hemoglobin: 9.7 g/dL — ABNORMAL LOW (ref 13.0–17.0)
MCH: 28.1 pg (ref 26.0–34.0)
MCHC: 32.1 g/dL (ref 30.0–36.0)
MCV: 87.5 fL (ref 78.0–100.0)
Platelets: 500 10*3/uL — ABNORMAL HIGH (ref 150–400)
RBC: 3.45 MIL/uL — ABNORMAL LOW (ref 4.22–5.81)
RDW: 12.2 % (ref 11.5–15.5)
WBC: 5.3 10*3/uL (ref 4.0–10.5)

## 2017-04-10 LAB — GLUCOSE, CAPILLARY
Glucose-Capillary: 106 mg/dL — ABNORMAL HIGH (ref 65–99)
Glucose-Capillary: 109 mg/dL — ABNORMAL HIGH (ref 65–99)
Glucose-Capillary: 115 mg/dL — ABNORMAL HIGH (ref 65–99)
Glucose-Capillary: 123 mg/dL — ABNORMAL HIGH (ref 65–99)
Glucose-Capillary: 143 mg/dL — ABNORMAL HIGH (ref 65–99)
Glucose-Capillary: 148 mg/dL — ABNORMAL HIGH (ref 65–99)
Glucose-Capillary: 175 mg/dL — ABNORMAL HIGH (ref 65–99)
Glucose-Capillary: 89 mg/dL (ref 65–99)

## 2017-04-10 SURGERY — APPLICATION, GRAFT, SKIN, SPLIT-THICKNESS
Anesthesia: General | Site: Leg Lower | Laterality: Right

## 2017-04-10 MED ORDER — HYDROMORPHONE HCL 1 MG/ML IJ SOLN
INTRAMUSCULAR | Status: AC
  Filled 2017-04-10: qty 1

## 2017-04-10 MED ORDER — FENTANYL CITRATE (PF) 250 MCG/5ML IJ SOLN
INTRAMUSCULAR | Status: AC
  Filled 2017-04-10: qty 5

## 2017-04-10 MED ORDER — ONDANSETRON HCL 4 MG PO TABS: 4.0000 mg | ORAL_TABLET | Freq: Four times a day (QID) | ORAL | Status: DC | PRN

## 2017-04-10 MED ORDER — PROPOFOL 10 MG/ML IV BOLUS
INTRAVENOUS | Status: AC
  Filled 2017-04-10: qty 40

## 2017-04-10 MED ORDER — SODIUM CHLORIDE 0.9 % IR SOLN
Status: DC | PRN
  Administered 2017-04-10: 1000 mL
  Administered 2017-04-10: 3000 mL

## 2017-04-10 MED ORDER — MIDAZOLAM HCL 2 MG/2ML IJ SOLN
INTRAMUSCULAR | Status: AC
  Filled 2017-04-10: qty 2

## 2017-04-10 MED ORDER — MUPIROCIN CALCIUM 2 % EX CREA
TOPICAL_CREAM | CUTANEOUS | Status: AC
  Filled 2017-04-10: qty 15

## 2017-04-10 MED ORDER — FENTANYL CITRATE (PF) 100 MCG/2ML IJ SOLN
INTRAMUSCULAR | Status: AC
  Filled 2017-04-10: qty 2

## 2017-04-10 MED ORDER — DOCUSATE SODIUM 100 MG PO CAPS: 100.0000 mg | ORAL_CAPSULE | Freq: Two times a day (BID) | ORAL | Status: DC

## 2017-04-10 MED ORDER — ACETAMINOPHEN 325 MG PO TABS: 650.0000 mg | ORAL_TABLET | Freq: Four times a day (QID) | ORAL | Status: DC | PRN

## 2017-04-10 MED ORDER — BISACODYL 10 MG RE SUPP: 10.0000 mg | Freq: Every day | RECTAL | Status: DC | PRN

## 2017-04-10 MED ORDER — FENTANYL CITRATE (PF) 100 MCG/2ML IJ SOLN
INTRAMUSCULAR | Status: DC | PRN
  Administered 2017-04-10: 100 ug via INTRAVENOUS
  Administered 2017-04-10 (×2): 50 ug via INTRAVENOUS

## 2017-04-10 MED ORDER — POLYETHYLENE GLYCOL 3350 17 G PO PACK: 17.0000 g | PACK | Freq: Every day | ORAL | Status: DC | PRN

## 2017-04-10 MED ORDER — METHOCARBAMOL 1000 MG/10ML IJ SOLN
500.0000 mg | Freq: Four times a day (QID) | INTRAVENOUS | Status: DC | PRN
  Filled 2017-04-10: qty 5

## 2017-04-10 MED ORDER — ONDANSETRON HCL 4 MG/2ML IJ SOLN: 4.0000 mg | Freq: Four times a day (QID) | INTRAMUSCULAR | Status: DC | PRN

## 2017-04-10 MED ORDER — ONDANSETRON HCL 4 MG/2ML IJ SOLN
INTRAMUSCULAR | Status: AC
  Filled 2017-04-10: qty 2

## 2017-04-10 MED ORDER — MIDAZOLAM HCL 5 MG/5ML IJ SOLN
INTRAMUSCULAR | Status: DC | PRN
  Administered 2017-04-10: 2 mg via INTRAVENOUS

## 2017-04-10 MED ORDER — PROPOFOL 10 MG/ML IV BOLUS
INTRAVENOUS | Status: DC | PRN
  Administered 2017-04-10: 200 mg via INTRAVENOUS

## 2017-04-10 MED ORDER — METHOCARBAMOL 500 MG PO TABS
500.0000 mg | ORAL_TABLET | Freq: Four times a day (QID) | ORAL | Status: DC | PRN
  Filled 2017-04-10 (×2): qty 1

## 2017-04-10 MED ORDER — METOCLOPRAMIDE HCL 5 MG/ML IJ SOLN: 5.0000 mg | Freq: Three times a day (TID) | INTRAMUSCULAR | Status: DC | PRN

## 2017-04-10 MED ORDER — FENTANYL CITRATE (PF) 100 MCG/2ML IJ SOLN
25.0000 ug | INTRAMUSCULAR | Status: DC | PRN
  Administered 2017-04-10 (×3): 50 ug via INTRAVENOUS

## 2017-04-10 MED ORDER — OXYCODONE HCL 5 MG PO TABS: 5.0000 mg | ORAL_TABLET | ORAL | Status: DC | PRN

## 2017-04-10 MED ORDER — HYDROMORPHONE HCL 1 MG/ML IJ SOLN
1.0000 mg | INTRAMUSCULAR | Status: DC | PRN
Start: 2017-04-10 — End: 2017-04-13
  Administered 2017-04-10 (×2): 1 mg via INTRAVENOUS

## 2017-04-10 MED ORDER — MINERAL OIL LIGHT 100 % EX OIL
TOPICAL_OIL | CUTANEOUS | Status: AC
  Filled 2017-04-10: qty 25

## 2017-04-10 MED ORDER — MAGNESIUM CITRATE PO SOLN: 1.0000 | Freq: Once | ORAL | Status: DC | PRN

## 2017-04-10 MED ORDER — SODIUM CHLORIDE 0.9 % IV SOLN
INTRAVENOUS | Status: DC
  Administered 2017-04-10 – 2017-04-12 (×2): via INTRAVENOUS

## 2017-04-10 MED ORDER — ACETAMINOPHEN 650 MG RE SUPP: 650.0000 mg | Freq: Four times a day (QID) | RECTAL | Status: DC | PRN

## 2017-04-10 MED ORDER — METOCLOPRAMIDE HCL 5 MG PO TABS: 5.0000 mg | ORAL_TABLET | Freq: Three times a day (TID) | ORAL | Status: DC | PRN

## 2017-04-10 SURGICAL SUPPLY — 45 items
ALLOGRAFT SKIN MESHD 125 SQ CM (Tissue) ×3 IMPLANT
BLADE DERMATOME SS (BLADE) ×2 IMPLANT
BNDG COHESIVE 6X5 TAN STRL LF (GAUZE/BANDAGES/DRESSINGS) IMPLANT
BNDG ESMARK 4X9 LF (GAUZE/BANDAGES/DRESSINGS) ×2 IMPLANT
BNDG GAUZE STRTCH 6 (GAUZE/BANDAGES/DRESSINGS) IMPLANT
COVER SURGICAL LIGHT HANDLE (MISCELLANEOUS) ×4 IMPLANT
CUFF TOURNIQUET SINGLE 18IN (TOURNIQUET CUFF) IMPLANT
CUFF TOURNIQUET SINGLE 24IN (TOURNIQUET CUFF) IMPLANT
DERMACARRIERS GRAFT 1 TO 1.5 (DISPOSABLE)
DRAPE U-SHAPE 47X51 STRL (DRAPES) ×2 IMPLANT
DRSG ADAPTIC 3X8 NADH LF (GAUZE/BANDAGES/DRESSINGS) IMPLANT
DRSG MEPITEL 4X7.2 (GAUZE/BANDAGES/DRESSINGS) ×2 IMPLANT
DRSG VAC ATS LRG SENSATRAC (GAUZE/BANDAGES/DRESSINGS) ×2 IMPLANT
DURAPREP 26ML APPLICATOR (WOUND CARE) ×2 IMPLANT
ELECT REM PT RETURN 9FT ADLT (ELECTROSURGICAL) ×2
ELECTRODE REM PT RTRN 9FT ADLT (ELECTROSURGICAL) ×1 IMPLANT
GAUZE SPONGE 4X4 12PLY STRL (GAUZE/BANDAGES/DRESSINGS) IMPLANT
GLOVE BIOGEL PI IND STRL 9 (GLOVE) ×1 IMPLANT
GLOVE BIOGEL PI INDICATOR 9 (GLOVE) ×1
GLOVE SURG ORTHO 9.0 STRL STRW (GLOVE) ×2 IMPLANT
GOWN STRL REUS W/ TWL XL LVL3 (GOWN DISPOSABLE) ×2 IMPLANT
GOWN STRL REUS W/TWL XL LVL3 (GOWN DISPOSABLE) ×2
GRAFT DERMACARRIERS 1 TO 1.5 (DISPOSABLE) IMPLANT
HANDPIECE INTERPULSE COAX TIP (DISPOSABLE) ×1
KIT BASIN OR (CUSTOM PROCEDURE TRAY) ×2 IMPLANT
KIT ROOM TURNOVER OR (KITS) ×2 IMPLANT
MANIFOLD NEPTUNE II (INSTRUMENTS) ×2 IMPLANT
NEEDLE HYPO 25GX1X1/2 BEV (NEEDLE) IMPLANT
NS IRRIG 1000ML POUR BTL (IV SOLUTION) ×2 IMPLANT
PACK ORTHO EXTREMITY (CUSTOM PROCEDURE TRAY) ×2 IMPLANT
PAD ARMBOARD 7.5X6 YLW CONV (MISCELLANEOUS) ×4 IMPLANT
PAD CAST 4YDX4 CTTN HI CHSV (CAST SUPPLIES) IMPLANT
PADDING CAST COTTON 4X4 STRL (CAST SUPPLIES)
SET HNDPC FAN SPRY TIP SCT (DISPOSABLE) ×1 IMPLANT
SKIN MESHED 125 SQ CM (Tissue) ×6 IMPLANT
STAPLER VISISTAT 35W (STAPLE) ×4 IMPLANT
SUCTION FRAZIER HANDLE 10FR (MISCELLANEOUS)
SUCTION TUBE FRAZIER 10FR DISP (MISCELLANEOUS) IMPLANT
SUT ETHILON 4 0 PS 2 18 (SUTURE) IMPLANT
SYR CONTROL 10ML LL (SYRINGE) IMPLANT
TOWEL OR 17X24 6PK STRL BLUE (TOWEL DISPOSABLE) ×2 IMPLANT
TOWEL OR 17X26 10 PK STRL BLUE (TOWEL DISPOSABLE) ×2 IMPLANT
TUBE CONNECTING 12X1/4 (SUCTIONS) IMPLANT
WATER STERILE IRR 1000ML POUR (IV SOLUTION) ×2 IMPLANT
WND VAC CANISTER 500ML (MISCELLANEOUS) ×2 IMPLANT

## 2017-04-10 NOTE — Op Note (Signed)
03/30/2017 - 04/10/2017  8:11 AM  PATIENT:  [name]    PRE-OPERATIVE DIAGNOSIS:  Necrotizing Fasciitis Right Leg  POST-OPERATIVE DIAGNOSIS:  Same  PROCEDURE:  SKIN GRAFT SPLIT THICKNESS RIGHT LEG Preparation of wound bed for split-thickness skin graft  SURGEON:  Newt Minion, MD  PHYSICIAN ASSISTANT:None ANESTHESIA:   General  PREOPERATIVE INDICATIONS:  QUEST TAVENNER is a  46 y.o. male with a diagnosis of Necrotizing Fasciitis Right Leg who failed conservative measures and elected for surgical management.    The risks benefits and alternatives were discussed with the patient preoperatively including but not limited to the risks of infection, bleeding, nerve injury, cardiopulmonary complications, the need for revision surgery, among others, and the patient was willing to proceed.  OPERATIVE IMPLANTS: STSG 375 sq cm MTF allograft  OPERATIVE FINDINGS: good granulation tissue  OPERATIVE PROCEDURE: Patient brought the operating room and underwent a general anesthetic. After adequate levels anesthesia obtained patient's right lower extremity was prepped using Betadine paint and draped into a sterile field a timeout was called. Patient had excellent improvement in the granulation tissue. The wound is approximately 90% granulation tissue. This was debrided with a Ronjair a 10 blade knife and then irrigated with pulsatile lavage. 3 sheets of the split-thickness skin graft each 125 cm2 was used this was held in place with staples. Mepitel followed by large wound VAC sponge secured with Ioban. This had a good suction fit.  Plan for discharge to home patient will need a [employer] wound VAC pump to be changed 3 times a week at home and anticipate course of treatment 3 months. Area wound is 375 cm.

## 2017-04-10 NOTE — Progress Notes (Addendum)
CSW met with patient to provide bed offers and discuss preference. Patient would prefer [facility]. CSW to check on bed availability and have facility initiate insurance authorization.  CSW to follow up.  Laveda Abbe, LCSW Clinical Social Worker [phone]   UPDATE 2:00 PM  CSW has confirmed with facility that they will initiate insurance authorization. CSW to follow up when received.  Laveda Abbe, [place] Clinical Social Worker [phone]

## 2017-04-10 NOTE — Anesthesia Postprocedure Evaluation (Signed)
Anesthesia Post Note  Patient: [name]  Procedure(s) Performed: Procedure(s) (LRB): SKIN GRAFT SPLIT THICKNESS RIGHT LEG (Right)     Patient location during evaluation: PACU Anesthesia Type: General Level of consciousness: awake Pain management: pain level controlled Vital Signs Assessment: post-procedure vital signs reviewed and stable Respiratory status: spontaneous breathing Cardiovascular status: stable    Last Vitals:  Vitals:   04/10/17 0409 04/10/17 0810  BP: (!) 140/92   Pulse: 76   Resp:    Temp: 36.8 C (!) (P) 36.1 C  SpO2: 100%     Last Pain:  Vitals:   04/10/17 0409  TempSrc: Oral  PainSc:                  Layliana Devins

## 2017-04-10 NOTE — Anesthesia Preprocedure Evaluation (Addendum)
Anesthesia Evaluation  Patient identified by MRN, date of birth, ID band Patient awake    Reviewed: Allergy & Precautions, H&P , NPO status , Patient's Chart, lab work & pertinent test results  Airway Mallampati: II   Neck ROM: full    Dental  (+) Teeth Intact   Pulmonary neg pulmonary ROS,    breath sounds clear to auscultation       Cardiovascular hypertension, On Medications + Peripheral Vascular Disease   Rhythm:regular Rate:Normal     Neuro/Psych    GI/Hepatic Neg liver ROS, Controlled,  Endo/Other  diabetes, Well Controlled  Renal/GU Renal disease     Musculoskeletal   Abdominal   Peds  Hematology negative hematology ROS (+)   Anesthesia Other Findings   Reproductive/Obstetrics                            Anesthesia Physical Anesthesia Plan  ASA: II  Anesthesia Plan: General   Post-op Pain Management:    Induction: Intravenous  PONV Risk Score and Plan: 1 and Ondansetron and Dexamethasone  Airway Management Planned: LMA  Additional Equipment:   Intra-op Plan:   Post-operative Plan: Extubation in OR  Informed Consent: I have reviewed the patients History and Physical, chart, labs and discussed the procedure including the risks, benefits and alternatives for the proposed anesthesia with the patient or authorized representative who has indicated his/her understanding and acceptance.   Dental advisory given  Plan Discussed with: CRNA and Anesthesiologist  Anesthesia Plan Comments:        Anesthesia Quick Evaluation

## 2017-04-10 NOTE — Progress Notes (Signed)
Spoke w patient at the bedside. Discussed anticipated needs at DC and being able to mange them at home. Patient states he lives at home w his 45 yo mother who would not be of much assistance, and his niece who will be out of the house at work most of the days. Currently patient receiving IV Abx multiple times a day with anticipated need for ~4weeks at home, as well as a VAC with anticipated need of several months. Patient stated he has been in contact w CSW and discussed rehab and that he continues to want to pursue that path for dispo.

## 2017-04-10 NOTE — Progress Notes (Signed)
PROGRESS NOTE    [name]  YKD:[id number] DOB: 1970-11-07 DOA: 03/30/2017 PCP: Tracie Harrier, MD   Brief Narrative: 46 y/o male with poorly controlled insulin requiring diabetes, HTN, gout, CKD, recently diagnosed DVT RLE on xarelto who presented to ED with fever and poorly healing right calf leg wound. Patient also noted to have poor control of his diabetes and hypertension. Patient seen by orthopedics, Dr. Sharol Given patient taken to the operating room for irrigation and debridement as well as wound VAC placement patient noted to have a large abscess which was cultured.underwent repeat irrigation and debridement 04/05/2017. ID consulted for antibiotic management. pt underwent skin graft split thickness of the right leg by Dr Sharol Given on 9/26  And recommended KCL wound VAC pump to be changed 3 times a week and to anticipate course of treatment for 3 months. ID recommended ANCEF  2g  Every 8 hours, until October 12 for a course of 4 weeks.   Assessment & Plan:   Principal Problem:   Sepsis ([place]) Active Problems:   HTN (hypertension)   Hyperglycemia due to type 2 diabetes mellitus ([place])   Acute kidney injury superimposed on chronic kidney disease (H[facility])   Hypoalbuminemia   Right leg DVT ([place])   Diabetic ulcer of calf ([place])   Noncompliance w/medication treatment due to intermit use of medication   Cellulitis of right lower leg   Sinus tachycardia   Fever   Chronic anticoagulation   Necrotizing fasciitis ([place])   Cellulitis of right lower extremity   Diabetic ulcer of ankle ([place])   ARF (acute renal failure) ([place])   Sepsis from necrotizing fascitis and wound cultures from 9/19 growing MSSA and E COLI Pt underwent I&D on 9/21. ID consulted , recommended atleast 4 weeks of IV antibiotics.  Pt underwent  skin graft split thickness of the right leg by Dr Sharol Given on 9/26  And recommended KCL wound VAC pump to be changed 3 times a week and to anticipate course of treatment for 3  months.  Uncontrolled DM:  CBG (last 3)   Recent Labs  04/10/17 0811 04/10/17 0939 04/10/17 1309  GLUCAP 143* 175* 148*    Resume SSI.  hgba1c is 15.5. TSH wnl. Currently on novolog 70/30 BID.    Acute on suspected CKD:  Creatinine appears to have reached baseline of 1.37.    Hypertension:  Well controlled.     Acute anemia of blood loss superimposed on Anemia of chronic disease:  Hemoglobin stabilized around 9. Baseline around 13.   DVT of RLE:  On xarelto at home, will need to switch to xarelto probably in am.  Outpatient follow up with hematologist,.    DVT prophylaxis:  Lovenox.  Code Status:  Full code.  Family Communication: none at bedside. Discussed the plan with the patient.  Disposition Plan: possibly to SNF in 1 to 2 days  Consultants:   Orthopedics and infectious diease.   Procedures: I&D on 9/21 skin graft split thickness of the right leg by Dr Sharol Given on 9/26   Antimicrobials:   IV Ancef 04/08/2017 till oct 12 th. Subjective:  reports pain on movement.    Objective: Vitals:   04/10/17 0900 04/10/17 0910 04/10/17 0931 04/10/17 1300  BP:   (!) 173/95 (!) 141/83  Pulse:  76 85 95  Resp: 19 11 16 16   Temp:  (!) 97 F (36.1 C) (!) 97.5 F (36.4 C)   TempSrc:      SpO2:  91% 96%   Weight:  Height:        Intake/Output Summary (Last 24 hours) at 04/10/17 1348 Last data filed at 04/10/17 1035  Gross per 24 hour  Intake             1060 ml  Output             1875 ml  Net             -815 ml   Filed Weights   04/06/17 2000 04/07/17 2022 04/09/17 2000  Weight: 108.4 kg (239 lb) 108.5 kg (239 lb 3.2 oz) 108.3 kg (238 lb 12.1 oz)    Examination:  General exam: Appears calm and comfortable  Respiratory system: Clear to auscultation. Respiratory effort normal. Cardiovascular system: S1 & S2 heard, RRR. No JVD, murmurs, rubs, gallops or clicks. No pedal edema. Gastrointestinal system: Abdomen is nondistended, soft and nontender. No  organomegaly or masses felt. Normal bowel sounds heard. Central nervous system: Alert and oriented. No focal neurological deficits. Extremities: right lower extremity with wound vac, draining blood.  Skin: No rashes, lesions or ulcers Psychiatry: Judgement and insight appear normal. Mood & affect appropriate.     Data Reviewed: I have personally reviewed following labs and imaging studies  CBC:  Recent Labs Lab 04/04/17 0236 04/05/17 0311 04/06/17 0250 04/07/17 0257 04/08/17 0237 04/09/17 0403 04/10/17 0429  WBC 12.2* 12.2* 9.1 9.0 9.0 6.2 5.3  NEUTROABS 11.0* 8.2* 5.7  --   --   --   --   HGB 11.9* 11.5* 10.6* 10.0* 9.3* 9.4* 9.7*  HCT 37.2* 35.4* 32.8* 31.8* 29.2* 29.7* 30.2*  MCV 88.6 87.4 89.1 89.8 89.6 88.1 87.5  PLT 408* 474* 442* 435* 435* 446* 324*   Basic Metabolic Panel:  Recent Labs Lab 04/04/17 0236  04/06/17 0250 04/07/17 0257 04/08/17 0237 04/09/17 0403 04/10/17 0429  NA 134*  < > 136 138 136 136 137  K 4.8  < > 3.8 4.1 4.1 3.7 3.7  CL 102  < > 102 103 104 103 101  CO2 22  < > 27 30 26 24 27   GLUCOSE 299*  < > 190* 167* 158* 119* 119*  BUN 21*  < > 23* 20 19 19 20   CREATININE 1.77*  < > 1.58* 1.61* 1.49* 1.43* 1.37*  CALCIUM 8.3*  < > 8.4* 8.5* 8.2* 8.4* 8.6*  MG 2.2  --   --   --   --   --   --   < > = values in this interval not displayed. GFR: Estimated Creatinine Clearance: 85.9 mL/min (A) (by C-G formula based on SCr of 1.37 mg/dL (H)). Liver Function Tests:  Recent Labs Lab 04/04/17 0236 04/05/17 0311  AST 18 14*  ALT 14* 14*  ALKPHOS 72 64  BILITOT 0.7 0.4  PROT 7.3 6.7  ALBUMIN 1.6* 1.7*   No results for input(s): LIPASE, AMYLASE in the last 168 hours. No results for input(s): AMMONIA in the last 168 hours. Coagulation Profile: No results for input(s): INR, PROTIME in the last 168 hours. Cardiac Enzymes: No results for input(s): CKTOTAL, CKMB, CKMBINDEX, TROPONINI in the last 168 hours. BNP (last 3 results) No results for  input(s): PROBNP in the last 8760 hours. HbA1C: No results for input(s): HGBA1C in the last 72 hours. CBG:  Recent Labs Lab 04/10/17 0012 04/10/17 0405 04/10/17 0811 04/10/17 0939 04/10/17 1309  GLUCAP 115* 106* 143* 175* 148*   Lipid Profile: No results for input(s): CHOL, HDL, LDLCALC, TRIG, CHOLHDL, LDLDIRECT in  the last 72 hours. Thyroid Function Tests: No results for input(s): TSH, T4TOTAL, FREET4, T3FREE, THYROIDAB in the last 72 hours. Anemia Panel: No results for input(s): VITAMINB12, FOLATE, FERRITIN, TIBC, IRON, RETICCTPCT in the last 72 hours. Sepsis Labs: No results for input(s): PROCALCITON, LATICACIDVEN in the last 168 hours.  Recent Results (from the past 240 hour(s))  Aerobic/Anaerobic Culture (surgical/deep wound)     Status: None   Collection Time: 04/03/17  4:49 PM  Result Value Ref Range Status   Specimen Description ABSCESS  Final   Special Requests RT LATERAL CALF  Final   Gram Stain   Final    ABUNDANT WBC PRESENT, PREDOMINANTLY PMN FEW GRAM POSITIVE COCCI IN CLUSTERS RARE GRAM NEGATIVE RODS Gram Stain Report Called to,Read Back By and Verified With: A Sheryn Bison RN [phone] 04/03/17 A BROWNING    Culture   Final    ABUNDANT STAPHYLOCOCCUS AUREUS ABUNDANT ESCHERICHIA COLI NO ANAEROBES ISOLATED    Report Status 04/08/2017 FINAL  Final   Organism ID, Bacteria STAPHYLOCOCCUS AUREUS  Final   Organism ID, Bacteria ESCHERICHIA COLI  Final      Susceptibility   Escherichia coli - MIC*    AMPICILLIN >=32 RESISTANT Resistant     CEFAZOLIN <=4 SENSITIVE Sensitive     CEFEPIME <=1 SENSITIVE Sensitive     CEFTAZIDIME <=1 SENSITIVE Sensitive     CEFTRIAXONE <=1 SENSITIVE Sensitive     CIPROFLOXACIN <=0.25 SENSITIVE Sensitive     GENTAMICIN <=1 SENSITIVE Sensitive     IMIPENEM <=0.25 SENSITIVE Sensitive     TRIMETH/SULFA >=320 RESISTANT Resistant     AMPICILLIN/SULBACTAM >=32 RESISTANT Resistant     PIP/TAZO <=4 SENSITIVE Sensitive     Extended ESBL NEGATIVE  Sensitive     * ABUNDANT ESCHERICHIA COLI   Staphylococcus aureus - MIC*    CIPROFLOXACIN <=0.5 SENSITIVE Sensitive     ERYTHROMYCIN <=0.25 SENSITIVE Sensitive     GENTAMICIN <=0.5 SENSITIVE Sensitive     OXACILLIN 0.5 SENSITIVE Sensitive     TETRACYCLINE <=1 SENSITIVE Sensitive     VANCOMYCIN <=0.5 SENSITIVE Sensitive     TRIMETH/SULFA <=10 SENSITIVE Sensitive     CLINDAMYCIN <=0.25 SENSITIVE Sensitive     RIFAMPIN <=0.5 SENSITIVE Sensitive     Inducible Clindamycin NEGATIVE Sensitive     * ABUNDANT STAPHYLOCOCCUS AUREUS  Nasopharyngeal Culture     Status: None   Collection Time: 04/05/17  6:55 AM  Result Value Ref Range Status   Specimen Description NASOPHARYNGEAL  Final   Special Requests NONE  Final   Culture RARE NORMAL NASOPHARYNGEAL FLORA  Final   Report Status 04/07/2017 FINAL  Final         Radiology Studies: No results found.      Scheduled Meds: . amLODipine  10 mg Oral Daily  . docusate sodium  100 mg Oral BID  . enoxaparin (LOVENOX) injection  110 mg Subcutaneous Q12H  . feeding supplement (PRO-STAT SUGAR FREE 64)  30 mL Oral TID BM  . fentaNYL      . fentaNYL      . HYDROmorphone      . insulin aspart  0-20 Units Subcutaneous TID WC  . insulin aspart  0-5 Units Subcutaneous QHS  . insulin aspart  10 Units Subcutaneous TID WC  . insulin glargine  30 Units Subcutaneous Daily  . living well with diabetes book   Does not apply Once  . metoprolol tartrate  100 mg Oral BID  . polyethylene glycol  17 g Oral  Daily  . senna-docusate  1 tablet Oral BID   Continuous Infusions: . sodium chloride 10 mL/hr at 04/08/17 2303  . sodium chloride 10 mL/hr at 04/08/17 1345  . sodium chloride 10 mL/hr at 04/10/17 1038  .  ceFAZolin (ANCEF) IV Stopped (04/10/17 0233)  . lactated ringers 50 mL/hr at 04/03/17 1616  . lactated ringers 10 mL/hr at 04/05/17 1045  . methocarbamol (ROBAXIN)  IV    . methocarbamol (ROBAXIN)  IV       LOS: 11 days    Time spent: 105  minutes    Jevaughn Degollado, MD [facility] Pager [phone]  If 7PM-7AM, please contact night-coverage [email or web address] Password [email or web address] 04/10/2017, 1:48 PM

## 2017-04-10 NOTE — Anesthesia Procedure Notes (Signed)
Procedure Name: LMA Insertion Date/Time: 04/10/2017 7:24 AM Performed by: Lieutenant Diego Pre-anesthesia Checklist: Patient identified, Emergency Drugs available, Suction available and Patient being monitored Patient Re-evaluated:Patient Re-evaluated prior to induction Oxygen Delivery Method: Circle system utilized Preoxygenation: Pre-oxygenation with 100% oxygen Induction Type: IV induction Ventilation: Mask ventilation without difficulty LMA: LMA inserted LMA Size: 5.0 Number of attempts: 1 Airway Equipment and Method: Bite block Placement Confirmation: positive ETCO2 and breath sounds checked- equal and bilateral Tube secured with: Tape Dental Injury: Teeth and Oropharynx as per pre-operative assessment

## 2017-04-10 NOTE — H&P (View-Only) (Signed)
Patient ID: [name], male   DOB: July 09, 1971, 46 y.o.   MRN: [id number] Patient without complaints wound VAC functioning well. Plan for return to the operating room on Wednesday to place skin graft.

## 2017-04-10 NOTE — Plan of Care (Signed)
Problem: Activity: Goal: Risk for activity intolerance will decrease Outcome: Completed/Met Date Met: 04/10/17 Encourage up in chair with meals

## 2017-04-10 NOTE — Transfer of Care (Signed)
Immediate Anesthesia Transfer of Care Note  Patient: KALLON CAYLOR  Procedure(s) Performed: Procedure(s): SKIN GRAFT SPLIT THICKNESS RIGHT LEG (Right)  Patient Location: PACU  Anesthesia Type:General  Level of Consciousness: sedated  Airway & Oxygen Therapy: Patient Spontanous Breathing and Patient connected to face mask oxygen  Post-op Assessment: Report given to RN and Post -op Vital signs reviewed and stable  Post vital signs: Reviewed and stable  Last Vitals:  Vitals:   04/09/17 2000 04/10/17 0409  BP: (!) 145/94 (!) 140/92  Pulse: 71 76  Resp: 17   Temp: 36.7 C 36.8 C  SpO2: 100% 100%    Last Pain:  Vitals:   04/10/17 0409  TempSrc: Oral  PainSc:       Patients Stated Pain Goal: 2 (80/22/33 6122)  Complications: No apparent anesthesia complications

## 2017-04-10 NOTE — Interval H&P Note (Signed)
History and Physical Interval Note:  04/10/2017 6:50 AM  [name]  has presented today for surgery, with the diagnosis of Necrotizing Fasciitis Right Leg  The various methods of treatment have been discussed with the patient and family. After consideration of risks, benefits and other options for treatment, the patient has consented to  Procedure(s): SKIN GRAFT SPLIT THICKNESS RIGHT LEG (Right) as a surgical intervention .  The patient's history has been reviewed, patient examined, no change in status, stable for surgery.  I have reviewed the patient's chart and labs.  Questions were answered to the patient's satisfaction.     Newt Minion

## 2017-04-10 NOTE — Progress Notes (Signed)
PT Cancellation Note  Patient Details Name: SAMEER TEEPLE MRN: [id number] DOB: May 20, 1971   Cancelled Treatment:    Reason Eval/Treat Not Completed: Patient at procedure or test/unavailable   Currently to OR for skin graft;  Will check on for PT tomorrow;   Roney Marion, [place] Pager [phone] Office [phone]    Colletta [place] 04/10/2017, 7:58 AM

## 2017-04-11 ENCOUNTER — Encounter (HOSPITAL_COMMUNITY): Payer: Self-pay | Admitting: Orthopedic Surgery

## 2017-04-11 LAB — GLUCOSE, CAPILLARY
Glucose-Capillary: 88 mg/dL (ref 65–99)
Glucose-Capillary: 71 mg/dL (ref 65–99)
Glucose-Capillary: 100 mg/dL — ABNORMAL HIGH (ref 65–99)
Glucose-Capillary: 127 mg/dL — ABNORMAL HIGH (ref 65–99)
Glucose-Capillary: 152 mg/dL — ABNORMAL HIGH (ref 65–99)

## 2017-04-11 MED ORDER — RIVAROXABAN 20 MG PO TABS: 20.0000 mg | ORAL_TABLET | Freq: Every day | ORAL | Status: DC

## 2017-04-11 MED ORDER — RIVAROXABAN 15 MG PO TABS
15.0000 mg | ORAL_TABLET | Freq: Two times a day (BID) | ORAL | Status: DC
  Administered 2017-04-11 – 2017-04-12 (×3): 15 mg via ORAL
  Filled 2017-04-11 (×3): qty 1

## 2017-04-11 MED ORDER — MUPIROCIN 2 % EX OINT: 1.0000 "application " | TOPICAL_OINTMENT | Freq: Two times a day (BID) | CUTANEOUS | Status: DC

## 2017-04-11 MED ORDER — CHLORHEXIDINE GLUCONATE CLOTH 2 % EX PADS: 6.0000 | MEDICATED_PAD | Freq: Every day | CUTANEOUS | Status: DC

## 2017-04-11 NOTE — Progress Notes (Signed)
ANTICOAGULATION CONSULT NOTE - Follow-Up Consult  Pharmacy Consult for Lovenox Indication: DVT  Not on File  Patient Measurements: Height: 5' 11.5" (181.6 cm) Weight: 233 lb 8 oz (105.9 kg) IBW/kg (Calculated) : 76.45  Assessment: 46 YOM on Xarelto PTA for newly dx RLE DVT on 9/5 - Ortho held starting 9/17 for possible plans for I&D. Full-dose Enox started 9/20 for bridging - received 2 doses then held for I&D on 9/21. Ortho wants to hold [facility] for 3d post-op, now restarted on 9/24 - s/p skin graft 9/26 AM - once all ortho procedures done, f/u transition back to Xarelto.  CBC and renal fxn stable.   Goal of Therapy:   Anti-Xa level 0.6-1 units/ml 4hrs after LMWH dose given Monitor platelets by anticoagulation protocol: Yes   Plan:  Continue enoxaparin 110mg  [place] Q12h F/U transition back to Xarelto (potentially restart today)  Elenor Quinones, PharmD, BCPS Clinical Pharmacist Pager [phone] 04/11/2017 7:38 AM

## 2017-04-11 NOTE — Progress Notes (Signed)
ANTICOAGULATION CONSULT NOTE - Follow Up Consult  Pharmacy Consult: Lovenox >> Xarelto Indication:  Recent RLE DVT  Not on File  Patient Measurements: Height: 5' 11.5" (181.6 cm) Weight: 233 lb 8 oz (105.9 kg) IBW/kg (Calculated) : 76.45  Vital Signs: Temp: 98.9 F (37.2 C) (09/27 0903) Temp Source: Oral (09/27 0903) BP: 142/89 (09/27 0903) Pulse Rate: 80 (09/27 0903)  Labs:  Recent Labs  04/09/17 0403 04/10/17 0429  HGB 9.4* 9.7*  HCT 29.7* 30.2*  PLT 446* 500*  CREATININE 1.43* 1.37*    Estimated Creatinine Clearance: 85 mL/min (A) (by C-G formula based on SCr of 1.37 mg/dL (H)).     Assessment: 36 YOM with RLE DVT on 03/20/17 and received roughly 2 weeks of the 15mg  PO BIDWM regimen.  Since 04/01/17 until today 04/11/17, patient has been off and on Lovenox bridge due to I&D and skin graft.  Pharmacy now received consult to resume Xarelto.  Spoke to MD, the time frame where the highest risk of having recurrent VTE is coming to and end, therefore, will resume Xarelto from where it was left off and finish the 3 week course.  Patient's renal function is improving.  CBC is relatively stable; no bleeding reported.   Goal of Therapy:  Appropriate anticoagulation Monitor platelets by anticoagulation protocol: Yes    Plan:  Stop Lovenox Xarelto 15mg  PO BIDWM, then on 04/19/17 start 20mg  daily with supper Pharmacy sill sign off and follow peripherally.  Thank you for the consult!   Dee Maday D. Mina Marble, PharmD, BCPS Pager:  [phone] 04/11/2017, 3:24 PM

## 2017-04-11 NOTE — Progress Notes (Signed)
PROGRESS NOTE    [name]  LOV:[id number] DOB: 1970/12/08 DOA: 03/30/2017 PCP: Tracie Harrier, MD   Brief Narrative: 46 y/o male with poorly controlled insulin requiring diabetes, HTN, gout, CKD, recently diagnosed DVT RLE on xarelto who presented to ED with fever and poorly healing right calf leg wound. Patient also noted to have poor control of his diabetes and hypertension. Patient seen by orthopedics, Dr. Sharol Given patient taken to the operating room for irrigation and debridement as well as wound VAC placement patient noted to have a large abscess which was cultured.underwent repeat irrigation and debridement 04/05/2017. ID consulted for antibiotic management. pt underwent skin graft split thickness of the right leg by Dr Sharol Given on 9/26  And recommended KCL wound VAC pump to be changed 3 times a week and to anticipate course of treatment for 3 months. ID recommended ANCEF  2g  Every 8 hours, until October 12 for a course of 4 weeks.   Assessment & Plan:   Principal Problem:   Sepsis ([place]) Active Problems:   HTN (hypertension)   Hyperglycemia due to type 2 diabetes mellitus ([place])   Acute kidney injury superimposed on chronic kidney disease (HCC)   Hypoalbuminemia   Right leg DVT ([place])   Diabetic ulcer of calf ([place])   Noncompliance w/medication treatment due to intermit use of medication   Cellulitis of right lower leg   Sinus tachycardia   Fever   Chronic anticoagulation   Necrotizing fasciitis ([place])   Cellulitis of right lower extremity   Diabetic ulcer of ankle ([place])   ARF (acute renal failure) ([place])   Sepsis from necrotizing fascitis and wound cultures from 9/19 growing MSSA and E COLI Pt underwent I&D on 9/21. ID consulted , recommended atleast 4 weeks of IV antibiotics.  Pt underwent  skin graft split thickness of the right leg by Dr Sharol Given on 9/26  And recommended KCL wound VAC pump to be changed 3 times a week and to anticipate course of treatment for 3 months. He will  need a PICC line.   Uncontrolled DM:  CBG (last 3)   Recent Labs  04/11/17 0440 04/11/17 0800 04/11/17 1201  GLUCAP 88 71 100*    Resume SSI.  hgba1c is 15.5. TSH wnl. Currently on novolog 70/30 BID.  cbgs are well controlled.    Acute on suspected CKD:  Creatinine appears to have reached baseline of 1.37.  No new complaints.    Hypertension:  Well controlled.     Acute anemia of blood loss superimposed on Anemia of chronic disease:  Hemoglobin stabilized around 9. Baseline around 13.  Recheck hemoglobin in am.   DVT of RLE:  On xarelto at home, restarted the xarelto.  Outpatient follow up with hematologist,.    DVT prophylaxis:  Xarelto.  Code Status:  Full code.  Family Communication: none at bedside. Discussed the plan with the patient.  Disposition Plan: possibly to SNF in 1 to 2 days  Consultants:   Orthopedics and infectious diease.   Procedures: I&D on 9/21 skin graft split thickness of the right leg by Dr Sharol Given on 9/26   Antimicrobials:   IV Ancef 04/08/2017 till oct 12 th. Subjective: No new complaints.    Objective: Vitals:   04/10/17 1300 04/10/17 2010 04/11/17 0441 04/11/17 0903  BP: (!) 141/83 122/71 (!) 149/94 (!) 142/89  Pulse: 95 95 80 80  Resp: 16 17 18 18   Temp:  99.6 F (37.6 C) 98.7 F (37.1 C) 98.9 F (37.2  C)  TempSrc:   Oral Oral  SpO2:  99% 100% 100%  Weight:  105.9 kg (233 lb 8 oz)    Height:        Intake/Output Summary (Last 24 hours) at 04/11/17 1507 Last data filed at 04/11/17 1417  Gross per 24 hour  Intake              600 ml  Output              800 ml  Net             -200 ml   Filed Weights   04/07/17 2022 04/09/17 2000 04/10/17 2010  Weight: 108.5 kg (239 lb 3.2 oz) 108.3 kg (238 lb 12.1 oz) 105.9 kg (233 lb 8 oz)    Examination:  General exam: Appears calm and comfortable no new complaints.  Respiratory system: Clear to auscultation. Respiratory effort normal. No wheezing or rhonchi.    Cardiovascular system: S1 & S2 heard, RRR. No JVD, murmurs, Gastrointestinal system: Abdomen issoft non tender non distended bowel sounds good.  Central nervous system: Alert and oriented. No focal neurological deficits. Extremities: right lower extremity with wound vac, draining blood.  Skin: No rashes, lesions or ulcers Psychiatry: Judgement and insight appear normal. Mood & affect appropriate.     Data Reviewed: I have personally reviewed following labs and imaging studies  CBC:  Recent Labs Lab 04/05/17 0311 04/06/17 0250 04/07/17 0257 04/08/17 0237 04/09/17 0403 04/10/17 0429  WBC 12.2* 9.1 9.0 9.0 6.2 5.3  NEUTROABS 8.2* 5.7  --   --   --   --   HGB 11.5* 10.6* 10.0* 9.3* 9.4* 9.7*  HCT 35.4* 32.8* 31.8* 29.2* 29.7* 30.2*  MCV 87.4 89.1 89.8 89.6 88.1 87.5  PLT 474* 442* 435* 435* 446* 448*   Basic Metabolic Panel:  Recent Labs Lab 04/06/17 0250 04/07/17 0257 04/08/17 0237 04/09/17 0403 04/10/17 0429  NA 136 138 136 136 137  K 3.8 4.1 4.1 3.7 3.7  CL 102 103 104 103 101  CO2 27 30 26 24 27   GLUCOSE 190* 167* 158* 119* 119*  BUN 23* 20 19 19 20   CREATININE 1.58* 1.61* 1.49* 1.43* 1.37*  CALCIUM 8.4* 8.5* 8.2* 8.4* 8.6*   GFR: Estimated Creatinine Clearance: 85 mL/min (A) (by C-G formula based on SCr of 1.37 mg/dL (H)). Liver Function Tests:  Recent Labs Lab 04/05/17 0311  AST 14*  ALT 14*  ALKPHOS 64  BILITOT 0.4  PROT 6.7  ALBUMIN 1.7*   No results for input(s): LIPASE, AMYLASE in the last 168 hours. No results for input(s): AMMONIA in the last 168 hours. Coagulation Profile: No results for input(s): INR, PROTIME in the last 168 hours. Cardiac Enzymes: No results for input(s): CKTOTAL, CKMB, CKMBINDEX, TROPONINI in the last 168 hours. BNP (last 3 results) No results for input(s): PROBNP in the last 8760 hours. HbA1C: No results for input(s): HGBA1C in the last 72 hours. CBG:  Recent Labs Lab 04/10/17 2012 04/11/17 0001 04/11/17 0440  04/11/17 0800 04/11/17 1201  GLUCAP 109* 89 88 71 100*   Lipid Profile: No results for input(s): CHOL, HDL, LDLCALC, TRIG, CHOLHDL, LDLDIRECT in the last 72 hours. Thyroid Function Tests: No results for input(s): TSH, T4TOTAL, FREET4, T3FREE, THYROIDAB in the last 72 hours. Anemia Panel: No results for input(s): VITAMINB12, FOLATE, FERRITIN, TIBC, IRON, RETICCTPCT in the last 72 hours. Sepsis Labs: No results for input(s): PROCALCITON, LATICACIDVEN in the last 168 hours.  Recent Results (  from the past 240 hour(s))  Aerobic/Anaerobic Culture (surgical/deep wound)     Status: None   Collection Time: 04/03/17  4:49 PM  Result Value Ref Range Status   Specimen Description ABSCESS  Final   Special Requests RT LATERAL CALF  Final   Gram Stain   Final    ABUNDANT WBC PRESENT, PREDOMINANTLY PMN FEW GRAM POSITIVE COCCI IN CLUSTERS RARE GRAM NEGATIVE RODS Gram Stain Report Called to,Read Back By and Verified With: A Sheryn Bison RN [phone] 04/03/17 A BROWNING    Culture   Final    ABUNDANT STAPHYLOCOCCUS AUREUS ABUNDANT ESCHERICHIA COLI NO ANAEROBES ISOLATED    Report Status 04/08/2017 FINAL  Final   Organism ID, Bacteria STAPHYLOCOCCUS AUREUS  Final   Organism ID, Bacteria ESCHERICHIA COLI  Final      Susceptibility   Escherichia coli - MIC*    AMPICILLIN >=32 RESISTANT Resistant     CEFAZOLIN <=4 SENSITIVE Sensitive     CEFEPIME <=1 SENSITIVE Sensitive     CEFTAZIDIME <=1 SENSITIVE Sensitive     CEFTRIAXONE <=1 SENSITIVE Sensitive     CIPROFLOXACIN <=0.25 SENSITIVE Sensitive     GENTAMICIN <=1 SENSITIVE Sensitive     IMIPENEM <=0.25 SENSITIVE Sensitive     TRIMETH/SULFA >=320 RESISTANT Resistant     AMPICILLIN/SULBACTAM >=32 RESISTANT Resistant     PIP/TAZO <=4 SENSITIVE Sensitive     Extended ESBL NEGATIVE Sensitive     * ABUNDANT ESCHERICHIA COLI   Staphylococcus aureus - MIC*    CIPROFLOXACIN <=0.5 SENSITIVE Sensitive     ERYTHROMYCIN <=0.25 SENSITIVE Sensitive     GENTAMICIN  <=0.5 SENSITIVE Sensitive     OXACILLIN 0.5 SENSITIVE Sensitive     TETRACYCLINE <=1 SENSITIVE Sensitive     VANCOMYCIN <=0.5 SENSITIVE Sensitive     TRIMETH/SULFA <=10 SENSITIVE Sensitive     CLINDAMYCIN <=0.25 SENSITIVE Sensitive     RIFAMPIN <=0.5 SENSITIVE Sensitive     Inducible Clindamycin NEGATIVE Sensitive     * ABUNDANT STAPHYLOCOCCUS AUREUS  Nasopharyngeal Culture     Status: None   Collection Time: 04/05/17  6:55 AM  Result Value Ref Range Status   Specimen Description NASOPHARYNGEAL  Final   Special Requests NONE  Final   Culture RARE NORMAL NASOPHARYNGEAL FLORA  Final   Report Status 04/07/2017 FINAL  Final         Radiology Studies: No results found.      Scheduled Meds: . amLODipine  10 mg Oral Daily  . docusate sodium  100 mg Oral BID  . enoxaparin (LOVENOX) injection  110 mg Subcutaneous Q12H  . feeding supplement (PRO-STAT SUGAR FREE 64)  30 mL Oral TID BM  . insulin aspart  0-20 Units Subcutaneous TID WC  . insulin aspart  0-5 Units Subcutaneous QHS  . insulin aspart  10 Units Subcutaneous TID WC  . insulin glargine  30 Units Subcutaneous Daily  . living well with diabetes book   Does not apply Once  . metoprolol tartrate  100 mg Oral BID  . polyethylene glycol  17 g Oral Daily  . senna-docusate  1 tablet Oral BID   Continuous Infusions: . sodium chloride 10 mL/hr at 04/08/17 2303  . sodium chloride 10 mL/hr at 04/08/17 1345  . sodium chloride 10 mL/hr at 04/10/17 1038  .  ceFAZolin (ANCEF) IV Stopped (04/11/17 5427)  . lactated ringers 50 mL/hr at 04/03/17 1616  . lactated ringers 10 mL/hr at 04/05/17 1045  . methocarbamol (ROBAXIN)  IV    .  methocarbamol (ROBAXIN)  IV       LOS: 12 days    Time spent: 90 minutes    Lynnelle Mesmer, MD [facility] Pager [phone]  If 7PM-7AM, please contact night-coverage [email or web address] Password [email or web address] 04/11/2017, 3:07 PM

## 2017-04-11 NOTE — Progress Notes (Signed)
Patient ID: [name], male   DOB: 1971-05-15, 46 y.o.   MRN: [id number] Patient's postoperative day 1 split-thickness skin graft right leg. Patient pulled off the track pad and the VAC sponge has not been working through the night. I have requested wound ostomy continence nursing to apply a new sponge and dressing. Patient may be discharged to home once social worker has set up home wound VAC dressings with a KCI wound VAC.

## 2017-04-11 NOTE — Consult Note (Addendum)
[facility] Nurse wound consult note Pt is followed by Dr Sharol Given of the ortho service for assessment and plan of care.  Requested to change Vac sponge and track pad since there appears to be clotted blood underneath the current dressing, 100 cc bloody drainage in the cannister. Wound type: Right leg with full thickness post-op wound with skin graft in place.  Removed outer drape and  Vac sponge, which had a large amt old blood clogging the sponge and track pad and was preventing adequate suction.  Left Mepitel contact layer in place over skin graft site and did not disturb or assess the wound bed. Reapplied 2 pieces of black sponge over Mepitel and attached to 169mm cont suction with good seal.  Pt was medicated for pain prior to the procedure and tolerated with mod amt discomfort.  No active bleeding noted at this time. Refer to Dr Sharol Given for further questions and plan of care; no further dressing changes are scheduled at this time. Please re-consult if further assistance is needed.  Thank-you,  Julien Girt MSN, [place], [employer], [place], [place]

## 2017-04-11 NOTE — Progress Notes (Signed)
Occupational Therapy Treatment Patient Details Name: [name] MRN: [id number] DOB: [birth year] Today's Date: 04/11/2017    History of present illness [name] is a 46 y.o. male who presents with diabetic insensate neuropathy history of HTN who is currently on Xarelto for a DVT. Patient states that he noticed a blunt trauma to the lateral aspect of his right calf about 3 months ago. Patient states she's been doing wound care. Patient states returned from a driving trip in [place] 2 weeks ago and had acute swelling of the right leg. Patient did go to emergency room and patient's Dopplers were positive for DVT in the right lower extremity popliteal fossa and patient was started on Xarelto. Patient states that since that time he's had foul-smelling drainage peeling necrotic skin and black eschar to the lateral aspect of the right calf. Plan for wound vac placement. Pt had skin graft 04/10/17 with then wound vac replaced.   OT comments  This 46 yo male admitted and underwent above presents to acute OT today with better pain control so able to do more with during therapy. Still not wanting to put RLE on ground when up on his feet.   Follow Up Recommendations  SNF;Supervision/Assistance - 24 hour     Equipment Recommendations  3 in 1 bedside commode       Precautions / Restrictions Precautions Precautions: Fall Precaution Comments: wound vac Restrictions Weight Bearing Restrictions: No       Mobility Bed Mobility Overal bed mobility: Needs Assistance Bed Mobility: Supine to Sit;Sit to Supine     Supine to sit: Supervision;HOB elevated Sit to supine: Supervision;HOB elevated      Transfers   Equipment used: Rolling walker (2 wheeled) Transfers: Sit to/from Stand Sit to Stand: Min assist         General transfer comment: Cues for safe hand placement; pt able to ambulate (hop) from bed to sink, stand to wash face, and back to bed with Min A for balance. Encouraged  him to put weight through his RLE--pt only able to bare minimal weight through toes due to increased pain.    Balance Overall balance assessment: Needs assistance Sitting-balance support: No upper extremity supported;Feet supported Sitting balance-Leahy Scale: Good     Standing balance support: Bilateral upper extremity supported;During functional activity Standing balance-Leahy Scale: Poor                             ADL either performed or assessed with clinical judgement   ADL Overall ADL's : Needs assistance/impaired     Grooming: Minimal assistance;Standing;Wash/dry face                 Lower Body Dressing Details (indicate cue type and reason): total A to don right sock Toilet Transfer: Minimal assistance;Ambulation;RW Toilet Transfer Details (indicate cue type and reason): bed>sink>bed                 Vision Patient Visual Report: No change from baseline            Cognition Arousal/Alertness: Awake/alert Behavior During Therapy: WFL for tasks assessed/performed Overall Cognitive Status: Within Functional Limits for tasks assessed                                          Exercises Other Exercises Other Exercises: rolled up a sheet  for pt and educated him on using this to place around ball of his RLE and use it to help stretch his heal cord and calf muscles.Encouraged him to do this 10 times every hour that he is awake counting to 10 (he wants to count to 20--which I told him was fine)           Pertinent Vitals/ Pain       Pain Assessment: Faces Faces Pain Scale: Hurts little more Pain Location: R distal LE Pain Descriptors / Indicators: Guarding;Grimacing Pain Intervention(s): Limited activity within patient's tolerance;Monitored during session;Repositioned         Frequency  Min 2X/week        Progress Toward Goals  OT Goals(current goals can now be found in the care plan section)  Progress towards OT  goals: Progressing toward goals     Plan Discharge plan remains appropriate       AM-PAC PT "6 Clicks" Daily Activity     Outcome Measure   Help from another person eating meals?: None Help from another person taking care of personal grooming?: A Little Help from another person toileting, which includes using toliet, bedpan, or urinal?: A Lot Help from another person bathing (including washing, rinsing, drying)?: A Little Help from another person to put on and taking off regular upper body clothing?: A Little Help from another person to put on and taking off regular lower body clothing?: A Lot 6 Click Score: 10    End of Session Equipment Utilized During Treatment: Gait belt;Rolling walker  OT Visit Diagnosis: Unsteadiness on feet (R26.81);Other abnormalities of gait and mobility (R26.89);Pain Pain - Right/Left: Right Pain - part of body: Leg   Activity Tolerance Patient tolerated treatment well   Patient Left in bed;with call bell/phone within reach   Nurse Communication          Time: 0086-7619 OT Time Calculation (min): 30 min  Charges: OT General Charges $OT Visit: 1 Visit OT Treatments $Self Care/Home Management : 23-37 mins  [place], OTR/L [phone] 04/11/2017

## 2017-04-11 NOTE — Progress Notes (Signed)
Physical Therapy Treatment Patient Details Name: [name] MRN: [id number] DOB: 11/07/1970 Today's Date: 04/11/2017    History of Present Illness [name] is a 46 y.o. male who presents with diabetic insensate neuropathy history of HTN who is currently on Xarelto for a DVT. Patient states that he noticed a blunt trauma to the lateral aspect of his right calf about 3 months ago. Patient states she's been doing wound care. Patient states returned from a driving trip in [place] 2 weeks ago and had acute swelling of the right leg. Patient did go to emergency room and patient's Dopplers were positive for DVT in the right lower extremity popliteal fossa and patient was started on Xarelto. Patient states that since that time he's had foul-smelling drainage peeling necrotic skin and black eschar to the lateral aspect of the right calf. Plan for wound vac placement. Pt had skin graft 04/10/17 with then wound vac replaced.    PT Comments    Pt limited during session due to pain and fatigue.  Pt agreeable to supine exercises to RLE during session.  Pt educated on heel cord stretching during session to improve ROM for carryover with standing and gait training.  Pt would benefit from a PRAFO kick stand boot to improve resting position in dorsiflexion and maintain neutral position of hip.  Pt currently resting in R knee flexion, R hip external rotation, and R plantar flexion on arrival.  Pt has difficulty moving away from these positions due to pain.  Plan next session for progression of gait training.  Pt continues to benefit from short term rehab at SNF to improve strength and function before returning home.    Follow Up Recommendations  SNF     Equipment Recommendations  Rolling walker with 5" wheels;3in1 (PT)    Recommendations for Other Services       Precautions / Restrictions Precautions Precautions: Fall Precaution Comments: wound vac Restrictions Weight Bearing Restrictions:  Yes (R LE WBAT)    Mobility  Bed Mobility               General bed mobility comments: Pt followed commands to reposition in bed but not agreeable to OOB mobility this evening.  Pt followed commands for hand placement to pull himself to [facility] with min assistance for RLE.  Pt edcuated on positioning in bed post tx.    Transfers                    Ambulation/Gait                 Stairs            Wheelchair Mobility    Modified Rankin (Stroke Patients Only)       Balance                                            Cognition Arousal/Alertness: Awake/alert Behavior During Therapy: WFL for tasks assessed/performed Overall Cognitive Status: Within Functional Limits for tasks assessed                                        Exercises General Exercises - Lower Extremity Ankle Circles/Pumps: Right;10 reps;AAROM;Supine Quad Sets: AROM;Right;10 reps;Supine Heel Slides: Right;10 reps;Supine;AAROM Hip ABduction/ADduction: Right;10 reps;Supine;AAROM Straight Leg Raises:  AAROM;Right;10 reps;Strengthening Other Exercises Other Exercises: HC and HS stretch 3x15 sec holds.  PTA encouraged self stretching as instructed by OT during earlier session patient performed additional 2x15 with rolled up sheet.      General Comments        Pertinent Vitals/Pain Pain Assessment: 0-10 Pain Score: 7  Pain Location: R distal LE Pain Descriptors / Indicators: Guarding;Grimacing Pain Intervention(s): Monitored during session;Repositioned    Home Living                      Prior Function            PT Goals (current goals can now be found in the care plan section) Acute Rehab PT Goals Patient Stated Goal: to feel better  Potential to Achieve Goals: Fair Progress towards PT goals: Progressing toward goals    Frequency    Min 3X/week      PT Plan Current plan remains appropriate    Co-evaluation               AM-PAC PT "6 Clicks" Daily Activity  Outcome Measure  Difficulty turning over in bed (including adjusting bedclothes, sheets and blankets)?: A Little Difficulty moving from lying on back to sitting on the side of the bed? : A Little Difficulty sitting down on and standing up from a chair with arms (e.g., wheelchair, bedside commode, etc,.)?: A Lot Help needed moving to and from a bed to chair (including a wheelchair)?: A Lot Help needed walking in hospital room?: A Lot   6 Click Score: 12    End of Session Equipment Utilized During Treatment: Gait belt Activity Tolerance: Patient limited by pain Patient left: in bed;with call bell/phone within reach Nurse Communication: Mobility status PT Visit Diagnosis: Pain Pain - Right/Left: Right Pain - part of body: Leg     Time: 1744-1801 PT Time Calculation (min) (ACUTE ONLY): 17 min  Charges:  $Therapeutic Exercise: 8-22 mins                    G Codes:       [name], PTA pager [phone]    [name] 04/11/2017, 6:27 PM

## 2017-04-12 LAB — BASIC METABOLIC PANEL
Anion gap: 8 (ref 5–15)
BUN: 21 mg/dL — ABNORMAL HIGH (ref 6–20)
CO2: 25 mmol/L (ref 22–32)
Calcium: 8.5 mg/dL — ABNORMAL LOW (ref 8.9–10.3)
Chloride: 103 mmol/L (ref 101–111)
Creatinine, Ser: 1.53 mg/dL — ABNORMAL HIGH (ref 0.61–1.24)
GFR calc Af Amer: >60 mL/min (ref >60)
GFR calc non Af Amer: 53 mL/min — ABNORMAL LOW (ref >60)
Glucose, Bld: 114 mg/dL — ABNORMAL HIGH (ref 65–99)
Potassium: 3.6 mmol/L (ref 3.5–5.1)
Sodium: 136 mmol/L (ref 135–145)

## 2017-04-12 LAB — GLUCOSE, CAPILLARY
Glucose-Capillary: 114 mg/dL — ABNORMAL HIGH (ref 65–99)
Glucose-Capillary: 127 mg/dL — ABNORMAL HIGH (ref 65–99)
Glucose-Capillary: 133 mg/dL — ABNORMAL HIGH (ref 65–99)
Glucose-Capillary: 86 mg/dL (ref 65–99)
Glucose-Capillary: 92 mg/dL (ref 65–99)

## 2017-04-12 LAB — CBC
HCT: 28.7 % — ABNORMAL LOW (ref 39.0–52.0)
Hemoglobin: 9 g/dL — ABNORMAL LOW (ref 13.0–17.0)
MCH: 27.6 pg (ref 26.0–34.0)
MCHC: 31.4 g/dL (ref 30.0–36.0)
MCV: 88 fL (ref 78.0–100.0)
Platelets: 476 10*3/uL — ABNORMAL HIGH (ref 150–400)
RBC: 3.26 MIL/uL — ABNORMAL LOW (ref 4.22–5.81)
RDW: 12.6 % (ref 11.5–15.5)
WBC: 5.4 10*3/uL (ref 4.0–10.5)

## 2017-04-12 MED ORDER — POLYETHYLENE GLYCOL 3350 17 G PO PACK: 17.0000 g | PACK | Freq: Every day | ORAL | 0 refills | Status: DC | PRN

## 2017-04-12 MED ORDER — METOPROLOL TARTRATE 100 MG PO TABS: 100.0000 mg | ORAL_TABLET | Freq: Two times a day (BID) | ORAL | Status: DC

## 2017-04-12 MED ORDER — AMLODIPINE BESYLATE 10 MG PO TABS: 10.0000 mg | ORAL_TABLET | Freq: Every day | ORAL | Status: DC

## 2017-04-12 MED ORDER — CYCLOBENZAPRINE HCL 5 MG PO TABS: 5.0000 mg | ORAL_TABLET | Freq: Three times a day (TID) | ORAL | 0 refills | Status: DC | PRN

## 2017-04-12 MED ORDER — DOCUSATE SODIUM 100 MG PO CAPS: 100.0000 mg | ORAL_CAPSULE | Freq: Two times a day (BID) | ORAL | 0 refills | Status: DC

## 2017-04-12 MED ORDER — PRO-STAT SUGAR FREE PO LIQD: 30.0000 mL | Freq: Three times a day (TID) | ORAL | 0 refills | Status: DC

## 2017-04-12 MED ORDER — HEPARIN SOD (PORK) LOCK FLUSH 100 UNIT/ML IV SOLN
250.0000 [IU] | INTRAVENOUS | Status: AC | PRN
  Administered 2017-04-12: 250 [IU]

## 2017-04-12 MED ORDER — INSULIN GLARGINE 100 UNIT/ML ~~LOC~~ SOLN: 30.0000 [IU] | Freq: Every day | SUBCUTANEOUS | 11 refills | Status: DC

## 2017-04-12 MED ORDER — CEFAZOLIN IV (FOR PTA / DISCHARGE USE ONLY): 2.0000 g | Freq: Three times a day (TID) | INTRAVENOUS | 0 refills | Status: AC

## 2017-04-12 MED ORDER — HYDROCODONE-ACETAMINOPHEN 5-325 MG PO TABS: 1.0000 | ORAL_TABLET | ORAL | 0 refills | Status: DC | PRN

## 2017-04-12 NOTE — Progress Notes (Signed)
Physical Therapy Treatment Patient Details Name: [name] MRN: [id number] DOB: [birth year] Today's Date: 04/12/2017    History of Present Illness [name] is a 46 y.o. male who presents with diabetic insensate neuropathy history of HTN who is currently on Xarelto for a DVT. Patient states that he noticed a blunt trauma to the lateral aspect of his right calf about 3 months ago. Patient states she's been doing wound care. Patient states returned from a driving trip in [place] 2 weeks ago and had acute swelling of the right leg. Patient did go to emergency room and patient's Dopplers were positive for DVT in the right lower extremity popliteal fossa and patient was started on Xarelto. Patient states that since that time he's had foul-smelling drainage peeling necrotic skin and black eschar to the lateral aspect of the right calf. Plan for wound vac placement. Pt had skin graft 04/10/17 with then wound vac replaced.    PT Comments    At beginning of session, pt reporting 0/10 pain and no need for pain meds. Agreeable to participation in mobility and exercises. After transferring to recliner, pt became agitated. He reports pain now as 9/10 and inability to further participate. RN notified and pain meds administered. Pt encouraged to perform RLE self stretches x 2 today.    Follow Up Recommendations  SNF     Equipment Recommendations  Rolling walker with 5" wheels;3in1 (PT)    Recommendations for Other Services       Precautions / Restrictions Precautions Precautions: Fall;Other (comment) Precaution Comments: wound vac    Mobility  Bed Mobility Overal bed mobility: Needs Assistance       Supine to sit: Supervision;HOB elevated     General bed mobility comments: supervision for management of lines, +rail  Transfers Overall transfer level: Needs assistance   Transfers: Sit to/from Stand;Stand Pivot Transfers Sit to Stand: Min guard Stand pivot transfers: Min  guard       General transfer comment: SPT bed to recliner without AD. Min guard assist for safety and line management. Sit to stand with RW to encourage WB RLE. Pt unable to get foot flat on floor. Stood staticall with RW x 2 minutes.  Ambulation/Gait             General Gait Details: unable due to pain   Stairs            Wheelchair Mobility    Modified Rankin (Stroke Patients Only)       Balance   Sitting-balance support: No upper extremity supported;Feet supported Sitting balance-Leahy Scale: Good     Standing balance support: Bilateral upper extremity supported;During functional activity Standing balance-Leahy Scale: Poor Standing balance comment: heavy reliance on RW                            Cognition Arousal/Alertness: Awake/alert Behavior During Therapy: WFL for tasks assessed/performed Overall Cognitive Status: Within Functional Limits for tasks assessed                                        Exercises      General Comments        Pertinent Vitals/Pain Pain Assessment: 0-10 Pain Score: 9  Pain Location: R distal LE Pain Descriptors / Indicators: Aching Pain Intervention(s): Patient requesting pain meds-RN notified;Limited activity within patient's tolerance;Repositioned  Home Living                      Prior Function            PT Goals (current goals can now be found in the care plan section) Acute Rehab PT Goals Patient Stated Goal: decrease pain PT Goal Formulation: With patient Time For Goal Achievement: 04/16/17 Potential to Achieve Goals: Fair Progress towards PT goals: Progressing toward goals    Frequency    Min 3X/week      PT Plan Current plan remains appropriate    Co-evaluation              AM-PAC PT "6 Clicks" Daily Activity  Outcome Measure  Difficulty turning over in bed (including adjusting bedclothes, sheets and blankets)?: A Little Difficulty moving from  lying on back to sitting on the side of the bed? : A Little Difficulty sitting down on and standing up from a chair with arms (e.g., wheelchair, bedside commode, etc,.)?: A Little Help needed moving to and from a bed to chair (including a wheelchair)?: A Little Help needed walking in hospital room?: A Lot Help needed climbing 3-5 steps with a railing? : Total 6 Click Score: 15    End of Session Equipment Utilized During Treatment: Gait belt Activity Tolerance: Patient limited by pain Patient left: in chair;with call bell/phone within reach Nurse Communication: Mobility status;Patient requests pain meds PT Visit Diagnosis: Pain Pain - Right/Left: Right Pain - part of body: Leg     Time: 7681-1572 PT Time Calculation (min) (ACUTE ONLY): 20 min  Charges:  $Therapeutic Activity: 8-22 mins                    G Codes:       Lorrin Goodell, PT  Office # [phone] Pager [phone]    Lorriane Shire 04/12/2017, 10:48 AM

## 2017-04-12 NOTE — Progress Notes (Signed)
Called report to [facility]. Gave report to [employer]. He is going to rm #[id number].

## 2017-04-12 NOTE — Progress Notes (Signed)
Clinical Social Worker facilitated patient discharge including contacting patient family and facility to confirm patient discharge plans.  Clinical information faxed to facility and family agreeable with plan.  CSW arranged ambulance transport via [facility] to [facility] .  RN to call [phone] (pt will go in rm#[id number])  report prior to discharge.  Clinical Social Worker will sign off for now as social work intervention is no longer needed. Please consult [place] again if new need arises.  Rhea Pink, MSW, [place]

## 2017-04-12 NOTE — Discharge Summary (Signed)
Physician Discharge Summary  [name] YQI:[id number] DOB: 03/19/1971 DOA: 03/30/2017  PCP: Tracie Harrier, MD  Admit date: 03/30/2017 Discharge date: 04/12/2017  Admitted From: home.  Disposition:  SNF  Recommendations for Outpatient Follow-up:  1. Follow up with PCP in 1-2 weeks 2. Please obtain BMP/CBC in one week 3. Please follow up with orthopedics as recommended. 4. Wound vacKCL wound VAC pump to be changed 3 times a week and to anticipate course of treatment for 3 months.    Discharge Condition:stable.  CODE STATUS: full code.  Diet recommendation: Heart Healthy   Brief/Interim Summary: 46 y/o male with poorly controlled insulin requiring diabetes, HTN, gout, CKD, recently diagnosed DVT RLE on xarelto who presented to ED with fever and poorly healing right calf leg wound. Patient also noted to have poor control of his diabetes and hypertension. Patient seen by orthopedics, Dr. Sharol Given patient taken to the operating room for irrigation and debridement as well as wound VAC placement patient noted to have a large abscess which was cultured.underwent repeat irrigation and debridement 04/05/2017. ID consulted for antibiotic management. pt underwent skin graft split thickness of the right leg by Dr Sharol Given on 9/26  And recommended KCL wound VAC pump to be changed 3 times a week and to anticipate course of treatment for 3 months. ID recommended ANCEF  2g  Every 8 hours, until October 12 for a course of 4 weeks.   Discharge Diagnoses:  Principal Problem:   Sepsis ([place]) Active Problems:   HTN (hypertension)   Hyperglycemia due to type 2 diabetes mellitus ([place])   Acute kidney injury superimposed on chronic kidney disease (HCC)   Hypoalbuminemia   Right leg DVT ([place])   Diabetic ulcer of calf ([place])   Noncompliance w/medication treatment due to intermit use of medication   Cellulitis of right lower leg   Sinus tachycardia   Fever   Chronic anticoagulation   Necrotizing fasciitis  ([place])   Cellulitis of right lower extremity   Diabetic ulcer of ankle ([place])   ARF (acute renal failure) ([place])  Sepsis from necrotizing fascitis and wound cultures from 9/19 growing MSSA and E COLI Pt underwent I&D on 9/21. ID consulted , recommended atleast 4 weeks of IV antibiotics.  Pt underwent  skin graft split thickness of the right leg by Dr Sharol Given on 9/26  And recommended KCL wound VAC pump to be changed 3 times a week and to anticipate course of treatment for 3 months.   Uncontrolled DM:  CBG (last 3)   Recent Labs (last 2 labs)    Recent Labs  04/11/17 0440 04/11/17 0800 04/11/17 1201  GLUCAP 88 71 100*      Resume SSI.  hgba1c is 15.5. TSH wnl.  cbgs are well controlled.    Acute on suspected CKD:  Creatinine appears to have reached baseline of between 1.3 to 1.5.  No new complaints.    Hypertension:  Well controlled.     Acute anemia of blood loss superimposed on Anemia of chronic disease:  Hemoglobin stabilized around 9. Baseline around 13.  Recheck hemoglobin in am is stable around 9.   DVT of RLE:  On xarelto at home, restarted the xarelto.  Outpatient follow up with hematologist,.     Discharge Instructions  Discharge Instructions    Ambulatory referral to Nutrition and Diabetic Education    Complete by:  As directed    Current A1c= 15.5%.  Will be discharged home on insulin.  PCP is Dr. Ginette Pitman with  [facility].  Thanks!   Diet - low sodium heart healthy    Complete by:  As directed    Discharge instructions    Complete by:  As directed    PLEASE follow up with orthopedics as recommended.  Please follow up with CBC and BMP in one week.   Home infusion instructions [facility] May follow [place] Dosing Protocol; May administer Cathflo as needed to maintain patency of vascular access device.; Flushing of vascular access device: per [facility] Protocol: 0.9% NaCl pre/post medica...    Complete by:  As directed    Instructions:  May  follow [place] Dosing Protocol   Instructions:  May administer Cathflo as needed to maintain patency of vascular access device.   Instructions:  Flushing of vascular access device: per [facility] Protocol: 0.9% NaCl pre/post medication administration and prn patency; Heparin 100 u/ml, 45m for implanted ports and Heparin 10u/ml, 541mfor all other central venous catheters.   Instructions:  May follow [facility] Anaphylaxis Protocol for First Dose Administration in the home: 0.9% NaCl at 25-50 ml/hr to maintain IV access for protocol meds. Epinephrine 0.3 ml IV/IM PRN and Benadryl 25-50 IV/IM PRN s/s of anaphylaxis.   Instructions:  [facility] Coordinator (RN) to assist per patient IV care needs in the home PRN.     Allergies as of 04/12/2017   Not on File     Medication List    STOP taking these medications   cephALEXin 500 MG capsule Commonly known as:  KEFLEX   ibuprofen 200 MG tablet Commonly known as:  ADVIL,MOTRIN   indomethacin 25 MG capsule Commonly known as:  INDOCIN   lisinopril 10 MG tablet Commonly known as:  PRINIVIL,ZESTRIL     TAKE these medications   amLODipine 10 MG tablet Commonly known as:  NORVASC Take 1 tablet (10 mg total) by mouth daily.   ceFAZolin IVPB Commonly known as:  ANCEF Inject 2 g into the vein every 8 (eight) hours. Indication:  Wound infection/necrotizig fascitis  Last Day of Therapy:  04/26/17 Labs - Once weekly:  CBC/D and BMP, Labs - Every other week:  ESR and CRP   cyclobenzaprine 5 MG tablet Commonly known as:  FLEXERIL Take 1 tablet (5 mg total) by mouth 3 (three) times daily as needed for muscle spasms.   docusate sodium 100 MG capsule Commonly known as:  COLACE Take 1 capsule (100 mg total) by mouth 2 (two) times daily.   feeding supplement (PRO-STAT SUGAR FREE 64) Liqd Take 30 mLs by mouth 3 (three) times daily between meals.   HYDROcodone-acetaminophen 5-325 MG tablet Commonly known as:  NORCO/VICODIN Take 1 tablet by  mouth every 4 (four) hours as needed for moderate pain.   insulin glargine 100 UNIT/ML injection Commonly known as:  LANTUS Inject 0.3 mLs (30 Units total) into the skin daily.   metoprolol tartrate 100 MG tablet Commonly known as:  LOPRESSOR Take 1 tablet (100 mg total) by mouth 2 (two) times daily.   polyethylene glycol packet Commonly known as:  MIRALAX / GLYCOLAX Take 17 g by mouth daily as needed.   Rivaroxaban 15 & 20 MG Tbpk Take as directed on package: Start with one 15109mablet by mouth twice a day with food. On Day 22, switch to one 109m19mblet once a day with food. What changed:  how much to take  how to take this  when to take this  additional instructions  Home Infusion Instuctions        Start     Ordered   04/12/17 0000  Home infusion instructions [facility] May follow [place] Dosing Protocol; May administer Cathflo as needed to maintain patency of vascular access device.; Flushing of vascular access device: per [facility] Protocol: 0.9% NaCl pre/post medica...    Question Answer Comment  Instructions May follow [place] Dosing Protocol   Instructions May administer Cathflo as needed to maintain patency of vascular access device.   Instructions Flushing of vascular access device: per [facility] Protocol: 0.9% NaCl pre/post medication administration and prn patency; Heparin 100 u/ml, 52m for implanted ports and Heparin 10u/ml, 590mfor all other central venous catheters.   Instructions May follow [facility] Anaphylaxis Protocol for First Dose Administration in the home: 0.9% NaCl at 25-50 ml/hr to maintain IV access for protocol meds. Epinephrine 0.3 ml IV/IM PRN and Benadryl 25-50 IV/IM PRN s/s of anaphylaxis.   Instructions [facility] Infusion Coordinator (RN) to assist per patient IV care needs in the home PRN.      04/12/17 1328       Durable Medical Equipment        Start     Ordered   04/09/17 0817  For home use only DME Walker  rolling  Once    Question:  Patient needs a walker to treat with the following condition  Answer:  Debility   04/09/17 0816   04/09/17 0817  For home use only DME 3 n 1  Once     04/09/17 0816       Discharge Care Instructions        Start     Ordered   04/13/17 0000  amLODipine (NORVASC) 10 MG tablet  Daily     04/12/17 1328   04/13/17 0000  insulin glargine (LANTUS) 100 UNIT/ML injection  Daily     04/12/17 1328   04/12/17 0000  Home infusion instructions [facility] May follow [facility] Protocol; May administer Cathflo as needed to maintain patency of vascular access device.; Fl[facility] vascular [facility]: 0.9% NaCl pre/post medica...    Question Answer Comment  Instructions [place] follow [place] Protocol   Instructions May administer Cathflo as needed to maintain patency of vascular access device.   Instructions Flushing of vascular access device: [facility]: 0.9% NaCl pre/post medication administration and prn patency; Heparin 100 u/ml, 8m67mor implanted ports and Heparin 10u/ml, 8ml3mr all other central venous catheters.   Instructions May follow [facility] Anaphylaxis Protocol for First Dose Administration in the home: 0.9% NaCl at 25-50 ml/hr to maintain IV access for protocol meds. Epinephrine 0.3 ml IV/IM PRN and Benadryl 25-50 IV/IM PRN s/s of anaphylaxis.   Instructions [facility] Infusion Coordinator (RN) to assist per patient IV care needs in the home PRN.      04/12/17 1328   04/12/17 0000  ceFAZolin (ANCEF) IVPB  Every 8 hours     04/12/17 1328   04/12/17 0000  cyclobenzaprine (FLEXERIL) 5 MG tablet  3 times daily PRN     04/12/17 1328   04/12/17 0000  docusate sodium (COLACE) 100 MG capsule  2 times daily     04/12/17 1328   04/12/17 0000  Amino Acids-Protein Hydrolys (FEEDING SUPPLEMENT, PRO-STAT SUGAR FREE 64,) LIQD  3 times daily between meals     04/12/17 1328   04/12/17 0000  HYDROcodone-acetaminophen  (NORCO/VICODIN) 5-325 MG tablet  Every 4 hours PRN     04/12/17 1328   04/12/17 0000  metoprolol tartrate (LOPRESSOR) 100 MG tablet  2 times daily     04/12/17 1328   04/12/17 0000  polyethylene glycol (MIRALAX / GLYCOLAX) packet  Daily PRN     04/12/17 1328   04/12/17 0000  Diet - low sodium heart healthy     04/12/17 1328   04/12/17 0000  Discharge instructions    Comments:  PLEASE follow up with orthopedics as recommended.  Please follow up with CBC and BMP in one week.   04/12/17 1328   04/01/17 0000  Ambulatory referral to Nutrition and Diabetic Education    Comments:  Current A1c= 15.5%.  Will be discharged home on insulin.  PCP is Dr. Ginette Pitman with [facility].  Thanks!   04/01/17 1421     Follow-up Information    Tracie Harrier, MD. Schedule an appointment as soon as possible for a visit in 1 week(s).   Specialty:  Internal Medicine Contact information: [address] [place] [address] [phone]        Newt Minion, MD. Schedule an appointment as soon as possible for a visit in 2 week(s).   Specialty:  Orthopedic Surgery Contact information: [place] [address] [phone]          Not on File  Consultations:  Orthopedics.    Procedures/Studies: Dg Chest 2 View  Result Date: 04/01/2017 CLINICAL DATA:  Fever and sepsis EXAM: CHEST  2 VIEW COMPARISON:  None. FINDINGS: Lateral imaging is degraded by motion. Normal heart size. Mild aortic tortuosity. No acute infiltrate or edema. No effusion or pneumothorax. No acute osseous findings. IMPRESSION: No evidence of active cardiopulmonary disease. Electronically Signed   By: Monte Fantasia M.D.   On: 04/01/2017 08:46   Dg Tibia/fibula Right  Result Date: 03/30/2017 CLINICAL DATA:  Laceration of the calf a few months ago. Patient is on Xarelto for blood clot and has increased bleeding of the wound in the back of the right calf. Rule out free air. EXAM:  RIGHT TIBIA AND FIBULA - 2 VIEW COMPARISON:  None. FINDINGS: There is generalized mild soft tissue induration and swelling of the right leg. There is a soft tissue laceration is seen along the lateral aspect of the leg at the junction of the middle and distal third. No soft tissue emphysema is seen. No acute osseous abnormality is noted. Distal intraosseous membrane ossifications are seen between the tibia and fibula. IMPRESSION: Generalized soft tissue swelling of the right leg with soft tissue laceration at the junction of the middle and distal third laterally. No soft tissue emphysema or suspicious osseous abnormality. Electronically Signed   By: Ashley Royalty M.D.   On: 03/30/2017 22:38   US Renal  Result Date: 04/05/2017 CLINICAL DATA:  Acute renal failure EXAM: RENAL / URINARY TRACT ULTRASOUND COMPLETE COMPARISON:  None. FINDINGS: Right Kidney: Length: 11.6 cm. Echogenicity within normal limits. No mass or hydronephrosis visualized. Left Kidney: Length: 13.1 cm. Echogenicity within normal limits. No mass or hydronephrosis visualized. Bladder: Appears normal for degree of bladder distention. Incidental note made of small left pleural effusion IMPRESSION: 1. Normal renal ultrasound 2. Incidental note made of tiny left pleural effusion Electronically Signed   By: Donavan Foil M.D.   On: 04/05/2017 20:09   US Venous Img Lower Unilateral Right  Result Date: 03/20/2017 CLINICAL DATA:  Right leg pain and swelling for several days. EXAM: Right  LOWER EXTREMITY VENOUS DOPPLER ULTRASOUND TECHNIQUE: Gray-scale sonography with graded compression, as well as color Doppler and duplex ultrasound were performed to evaluate the lower extremity deep venous systems from the level of the common femoral vein and including the common femoral, femoral, profunda femoral, popliteal and calf veins including the posterior tibial, peroneal and gastrocnemius veins when visible. The superficial great saphenous vein was also  interrogated. Spectral Doppler was utilized to evaluate flow at rest and with distal augmentation maneuvers in the common femoral, femoral and popliteal veins. COMPARISON:  None. FINDINGS: Contralateral Common Femoral Vein: Respiratory phasicity is normal and symmetric with the symptomatic side. No evidence of thrombus. Normal compressibility. Common Femoral Vein: No evidence of thrombus. Normal compressibility, respiratory phasicity and response to augmentation. Saphenofemoral Junction: No evidence of thrombus. Normal compressibility and flow on color Doppler imaging. Profunda Femoral Vein: No evidence of thrombus. Normal compressibility and flow on color Doppler imaging. Femoral Vein: No evidence of thrombus. Normal compressibility, respiratory phasicity and response to augmentation. Popliteal Vein: Popliteal vein demonstrates expansile echogenic thrombus with noncompressibility of the vessels. Some flow is demonstrated on color flow Doppler imaging through this area. Calf Veins: No evidence of thrombus. Normal compressibility and flow on color Doppler imaging. Superficial Great Saphenous Vein: No evidence of thrombus. Normal compressibility and flow on color Doppler imaging. Venous Reflux:  None. Other Findings:  None. IMPRESSION: Positive study for acute deep venous thrombosis demonstrated in the popliteal vein. These results were called by telephone at the time of interpretation on 03/20/2017 at 4:45 am to Dr. Marjean Donna , who verbally acknowledged these results. Electronically Signed   By: Lucienne Capers M.D.   On: 03/20/2017 04:49       Subjective: No new complaints.   Discharge Exam: Vitals:   04/12/17 0605 04/12/17 0921  BP: (!) 145/79 (!) 141/76  Pulse:  97  Resp:  18  Temp:  98.8 F (37.1 C)  SpO2:  100%   Vitals:   04/11/17 2015 04/12/17 0413 04/12/17 0605 04/12/17 0921  BP:  (!) 153/112 (!) 145/79 (!) 141/76  Pulse: 94 84  97  Resp: '19 18  18  ' Temp: 98.6 F (37 C) 97.7 F  (36.5 C)  98.8 F (37.1 C)  TempSrc: Oral Oral  Oral  SpO2: 100% 100%  100%  Weight: 105.3 kg (232 lb 2.3 oz)     Height:        General: Pt is alert, awake, not in acute distress Cardiovascular: RRR, S1/S2 +, no rubs, no gallops Respiratory: CTA bilaterally, no wheezing, no rhonchi Abdominal: Soft, NT, ND, bowel sounds + Extremities: no edema, no cyanosis    The results of significant diagnostics from this hospitalization (including imaging, microbiology, ancillary and laboratory) are listed below for reference.     Microbiology: Recent Results (from the past 240 hour(s))  Aerobic/Anaerobic Culture (surgical/deep wound)     Status: None   Collection Time: 04/03/17  4:49 PM  Result Value Ref Range Status   Specimen Description ABSCESS  Final   Special Requests RT LATERAL CALF  Final   Gram Stain   Final    ABUNDANT WBC PRESENT, PREDOMINANTLY PMN FEW GRAM POSITIVE COCCI IN CLUSTERS RARE GRAM NEGATIVE RODS Gram Stain Report Called to,Read Back By and Verified With: A Sheryn Bison RN [id number] 04/03/17 A BROWNING    Culture   Final    ABUNDANT STAPHYLOCOCCUS AUREUS ABUNDANT ESCHERICHIA COLI NO ANAEROBES ISOLATED    Report Status 04/08/2017 FINAL  Final   Organism  ID, Bacteria STAPHYLOCOCCUS AUREUS  Final   Organism ID, Bacteria ESCHERICHIA COLI  Final      Susceptibility   Escherichia coli - MIC*    AMPICILLIN >=32 RESISTANT Resistant     CEFAZOLIN <=4 SENSITIVE Sensitive     CEFEPIME <=1 SENSITIVE Sensitive     CEFTAZIDIME <=1 SENSITIVE Sensitive     CEFTRIAXONE <=1 SENSITIVE Sensitive     CIPROFLOXACIN <=0.25 SENSITIVE Sensitive     GENTAMICIN <=1 SENSITIVE Sensitive     IMIPENEM <=0.25 SENSITIVE Sensitive     TRIMETH/SULFA >=320 RESISTANT Resistant     AMPICILLIN/SULBACTAM >=32 RESISTANT Resistant     PIP/TAZO <=4 SENSITIVE Sensitive     Extended ESBL NEGATIVE Sensitive     * ABUNDANT ESCHERICHIA COLI   Staphylococcus aureus - MIC*    CIPROFLOXACIN <=0.5 SENSITIVE  Sensitive     ERYTHROMYCIN <=0.25 SENSITIVE Sensitive     GENTAMICIN <=0.5 SENSITIVE Sensitive     OXACILLIN 0.5 SENSITIVE Sensitive     TETRACYCLINE <=1 SENSITIVE Sensitive     VANCOMYCIN <=0.5 SENSITIVE Sensitive     TRIMETH/SULFA <=10 SENSITIVE Sensitive     CLINDAMYCIN <=0.25 SENSITIVE Sensitive     RIFAMPIN <=0.5 SENSITIVE Sensitive     Inducible Clindamycin NEGATIVE Sensitive     * ABUNDANT STAPHYLOCOCCUS AUREUS  Nasopharyngeal Culture     Status: None   Collection Time: 04/05/17  6:55 AM  Result Value Ref Range Status   Specimen Description NASOPHARYNGEAL  Final   Special Requests NONE  Final   Culture RARE NORMAL NASOPHARYNGEAL FLORA  Final   Report Status 04/07/2017 FINAL  Final     Labs: BNP (last 3 results) No results for input(s): BNP in the last 8760 hours. Basic Metabolic Panel:  Recent Labs Lab 04/07/17 0257 04/08/17 0237 04/09/17 0403 04/10/17 0429 04/12/17 0408  NA 138 136 136 137 136  K 4.1 4.1 3.7 3.7 3.6  CL 103 104 103 101 103  CO2 '30 26 24 27 25  ' GLUCOSE 167* 158* 119* 119* 114*  BUN '20 19 19 20 ' 21*  CREATININE 1.61* 1.49* 1.43* 1.37* 1.53*  CALCIUM 8.5* 8.2* 8.4* 8.6* 8.5*   Liver Function Tests: No results for input(s): AST, ALT, ALKPHOS, BILITOT, PROT, ALBUMIN in the last 168 hours. No results for input(s): LIPASE, AMYLASE in the last 168 hours. No results for input(s): AMMONIA in the last 168 hours. CBC:  Recent Labs Lab 04/06/17 0250 04/07/17 0257 04/08/17 0237 04/09/17 0403 04/10/17 0429 04/12/17 0408  WBC 9.1 9.0 9.0 6.2 5.3 5.4  NEUTROABS 5.7  --   --   --   --   --   HGB 10.6* 10.0* 9.3* 9.4* 9.7* 9.0*  HCT 32.8* 31.8* 29.2* 29.7* 30.2* 28.7*  MCV 89.1 89.8 89.6 88.1 87.5 88.0  PLT 442* 435* 435* 446* 500* 476*   Cardiac Enzymes: No results for input(s): CKTOTAL, CKMB, CKMBINDEX, TROPONINI in the last 168 hours. BNP: Invalid input(s): POCBNP CBG:  Recent Labs Lab 04/11/17 2011 04/12/17 0009 04/12/17 0403  04/12/17 0809 04/12/17 1212  GLUCAP 152* 86 92 114* 127*   D-Dimer No results for input(s): DDIMER in the last 72 hours. Hgb A1c No results for input(s): HGBA1C in the last 72 hours. Lipid Profile No results for input(s): CHOL, HDL, LDLCALC, TRIG, CHOLHDL, LDLDIRECT in the last 72 hours. Thyroid function studies No results for input(s): TSH, T4TOTAL, T3FREE, THYROIDAB in the last 72 hours.  Invalid input(s): FREET3 Anemia work up No results for input(s): VITAMINB12,  FOLATE, FERRITIN, TIBC, IRON, RETICCTPCT in the last 72 hours. Urinalysis    Component Value Date/Time   COLORURINE STRAW (A) 03/30/2017 2130   APPEARANCEUR CLEAR 03/30/2017 2130   LABSPEC 1.016 03/30/2017 2130   PHURINE 5.0 03/30/2017 2130   GLUCOSEU >=500 (A) 03/30/2017 2130   HGBUR MODERATE (A) 03/30/2017 2130   BILIRUBINUR NEGATIVE 03/30/2017 2130   KETONESUR 20 (A) 03/30/2017 2130   PROTEINUR 30 (A) 03/30/2017 2130   NITRITE NEGATIVE 03/30/2017 2130   LEUKOCYTESUR NEGATIVE 03/30/2017 2130   Sepsis Labs Invalid input(s): PROCALCITONIN,  WBC,  LACTICIDVEN Microbiology Recent Results (from the past 240 hour(s))  Aerobic/Anaerobic Culture (surgical/deep wound)     Status: None   Collection Time: 04/03/17  4:49 PM  Result Value Ref Range Status   Specimen Description ABSCESS  Final   Special Requests RT LATERAL CALF  Final   Gram Stain   Final    ABUNDANT WBC PRESENT, PREDOMINANTLY PMN FEW GRAM POSITIVE COCCI IN CLUSTERS RARE GRAM NEGATIVE RODS Gram Stain Report Called to,Read Back By and Verified With: A Sheryn Bison RN [id number] 04/03/17 A BROWNING    Culture   Final    ABUNDANT STAPHYLOCOCCUS AUREUS ABUNDANT ESCHERICHIA COLI NO ANAEROBES ISOLATED    Report Status 04/08/2017 FINAL  Final   Organism ID, Bacteria STAPHYLOCOCCUS AUREUS  Final   Organism ID, Bacteria ESCHERICHIA COLI  Final      Susceptibility   Escherichia coli - MIC*    AMPICILLIN >=32 RESISTANT Resistant     CEFAZOLIN <=4 SENSITIVE  Sensitive     CEFEPIME <=1 SENSITIVE Sensitive     CEFTAZIDIME <=1 SENSITIVE Sensitive     CEFTRIAXONE <=1 SENSITIVE Sensitive     CIPROFLOXACIN <=0.25 SENSITIVE Sensitive     GENTAMICIN <=1 SENSITIVE Sensitive     IMIPENEM <=0.25 SENSITIVE Sensitive     TRIMETH/SULFA >=320 RESISTANT Resistant     AMPICILLIN/SULBACTAM >=32 RESISTANT Resistant     PIP/TAZO <=4 SENSITIVE Sensitive     Extended ESBL NEGATIVE Sensitive     * ABUNDANT ESCHERICHIA COLI   Staphylococcus aureus - MIC*    CIPROFLOXACIN <=0.5 SENSITIVE Sensitive     ERYTHROMYCIN <=0.25 SENSITIVE Sensitive     GENTAMICIN <=0.5 SENSITIVE Sensitive     OXACILLIN 0.5 SENSITIVE Sensitive     TETRACYCLINE <=1 SENSITIVE Sensitive     VANCOMYCIN <=0.5 SENSITIVE Sensitive     TRIMETH/SULFA <=10 SENSITIVE Sensitive     CLINDAMYCIN <=0.25 SENSITIVE Sensitive     RIFAMPIN <=0.5 SENSITIVE Sensitive     Inducible Clindamycin NEGATIVE Sensitive     * ABUNDANT STAPHYLOCOCCUS AUREUS  Nasopharyngeal Culture     Status: None   Collection Time: 04/05/17  6:55 AM  Result Value Ref Range Status   Specimen Description NASOPHARYNGEAL  Final   Special Requests NONE  Final   Culture RARE NORMAL NASOPHARYNGEAL FLORA  Final   Report Status 04/07/2017 FINAL  Final     Time coordinating discharge: Over 30 minutes  SIGNED:   Hosie Poisson, MD  [facility] 04/12/2017, 1:30 PM Pager   If 7PM-7AM, please contact night-coverage [email or web address] Password [email or web address]

## 2017-04-12 NOTE — Progress Notes (Signed)
Patient left via stretcher with PTar. Alert & oriented x4. PICC line intact. D/C instructions given and explained. Advised that if he has fever or uncontrollable pain to return to the ED.

## 2017-04-15 ENCOUNTER — Non-Acute Institutional Stay (SKILLED_NURSING_FACILITY): Payer: BLUE CROSS/BLUE SHIELD | Admitting: Internal Medicine

## 2017-04-15 ENCOUNTER — Encounter: Payer: Self-pay | Admitting: Internal Medicine

## 2017-04-15 DIAGNOSIS — D62 Acute posthemorrhagic anemia: Secondary | ICD-10-CM | POA: Diagnosis not present

## 2017-04-15 DIAGNOSIS — N189 Chronic kidney disease, unspecified: Secondary | ICD-10-CM | POA: Diagnosis not present

## 2017-04-15 DIAGNOSIS — A4151 Sepsis due to Escherichia coli [E. coli]: Secondary | ICD-10-CM | POA: Diagnosis not present

## 2017-04-15 DIAGNOSIS — I82431 Acute embolism and thrombosis of right popliteal vein: Secondary | ICD-10-CM

## 2017-04-15 DIAGNOSIS — N179 Acute kidney failure, unspecified: Secondary | ICD-10-CM | POA: Diagnosis not present

## 2017-04-15 DIAGNOSIS — E1165 Type 2 diabetes mellitus with hyperglycemia: Secondary | ICD-10-CM

## 2017-04-15 DIAGNOSIS — L03115 Cellulitis of right lower limb: Secondary | ICD-10-CM

## 2017-04-15 DIAGNOSIS — E1122 Type 2 diabetes mellitus with diabetic chronic kidney disease: Secondary | ICD-10-CM

## 2017-04-15 DIAGNOSIS — N182 Chronic kidney disease, stage 2 (mild): Secondary | ICD-10-CM

## 2017-04-15 DIAGNOSIS — Z794 Long term (current) use of insulin: Secondary | ICD-10-CM

## 2017-04-15 DIAGNOSIS — A4101 Sepsis due to Methicillin susceptible Staphylococcus aureus: Secondary | ICD-10-CM | POA: Diagnosis not present

## 2017-04-15 DIAGNOSIS — M726 Necrotizing fasciitis: Secondary | ICD-10-CM | POA: Diagnosis not present

## 2017-04-15 DIAGNOSIS — I10 Essential (primary) hypertension: Secondary | ICD-10-CM | POA: Diagnosis not present

## 2017-04-15 NOTE — Progress Notes (Signed)
: Provider:  Noah Delaine. Sheppard Coil, MD Location:  [place] Room Number: [phone] Place of Service:  SNF (31)  PCP: Tracie Harrier, MD Patient Care Team: Tracie Harrier, MD as PCP - General (Internal Medicine)  Extended Emergency Contact Information Primary Emergency Contact: Ruthine Dose [place] Phone: [phone] Mobile Phone: [phone] Relation: Mother     Allergies: Patient has no known allergies.  Chief Complaint  Patient presents with  . New Admit To SNF    following hospitalization 03/30/17 to 04/12/17 sepsis    HPI: Patient is 46 y.o. male with diabetes mellitus type 2, hypertension, DVT on Xarelto, who presents to the ED with complaints of wound with right leg. Patient notes that approximately 3 months ago he had sustained a cut to his right calf. During the time he notes he had been doing his best to the wound clean and placing bandages as needed. However after returning from a trip to [place] who reported to swelling of the right leg from the knee down with significant pain worse with walking. He was seen in the ED on 9/5 and at that time found to have a DVT and started on Zarrella toe. Since that time patient has had "dark nasty" discharge and the pain has worsened. Other symptoms include fever, chills, and urinary frequency. Patient denies chest pain or shortness of breath. Patient knows he has just established care with a primary care provider 3 days ago. In ED patient was found to have a fever 102, pulse 88 210, respirations 29, blood pressure as high as 185/119 white count of 13.6 elevated lactic acid elevated CRP. She was admitted to [facility] from 9/15-28 for sepsis secondary to necrotizing fasciitis of right calf. Patient was treated with broad-spectrum IV antibiotics and orthopedics was consulted. Dr. due to the patient to the OR for irrigation and debridement as well as wound VAC placement for a large  abscess. Wound was revisited on 9/26 when a patient underwent a skin graft split thickness to the right leg in OR. Wound cultures grew out MSSA and ESCHERICHIA COLI and patient will be on IV Ancef for 4 weeks per ID. Hospital course was further complicated by hyperglycemia, patient had a hemoglobin A1c of 15.5, because he had not been on any treatment but once treatment was instituted his CBGs were well controlled. Also complicating stay was acute blood loss anemia not requiring a transfusion and acute on chronic kidney disease which resolved to a baseline creatinine of 1.3-1.5. Patient is admitted to skilled nursing facility for OT/PT.  Past Medical History:  Diagnosis Date  . ARF (acute renal failure) ([place])   . Cellulitis of right lower leg 03/31/2017  . Chronic anticoagulation 03/31/2017  . Diabetes mellitus without complication ([place])   . Diabetic ulcer of calf ([place]) 03/31/2017  . DVT of proximal lower limb ([place]) 03/20/2017   R calf area  . Gout   . Hypertension   . Hypoalbuminemia 03/31/2017  . Necrotizing fasciitis ([place])   . Right leg DVT ([place]) 03/31/2017  . Sinus tachycardia 03/31/2017  . Type 2 diabetes mellitus with stage 2 chronic kidney disease, with long-term current use of insulin ([place]) 03/27/2017    Past Surgical History:  Procedure Laterality Date  . I&D EXTREMITY Right 04/03/2017   Procedure: IRRIGATION AND DEBRIDEMENT RIGHT LEG, APPLY WOUND VAC;  Surgeon: Newt Minion, MD;  Location: [facility];  Service: Orthopedics;  Laterality: Right;  . I&D EXTREMITY Right  04/05/2017   Procedure: IRRIGATION AND DEBRIDEMENT RIGHT LEG;  Surgeon: Newt Minion, MD;  Location: [facility];  Service: Orthopedics;  Laterality: Right;  . SCROTAL SURGERY  2006  . SKIN SPLIT GRAFT Right 04/10/2017   Procedure: SKIN GRAFT SPLIT THICKNESS RIGHT LEG;  Surgeon: Newt Minion, MD;  Location: [facility];  Service: Orthopedics;  Laterality: Right;    Allergies as of 04/15/2017   No Known Allergies     Medication List        Accurate as of 04/15/17 10:52 AM. Always use your most recent med list.          amLODipine 10 MG tablet Commonly known as:  NORVASC Take 1 tablet (10 mg total) by mouth daily.   ceFAZolin IVPB Commonly known as:  ANCEF Inject 2 g into the vein every 8 (eight) hours. Indication:  Wound infection/necrotizig fascitis  Last Day of Therapy:  04/26/17 Labs - Once weekly:  CBC/D and BMP, Labs - Every other week:  ESR and CRP   cyclobenzaprine 5 MG tablet Commonly known as:  FLEXERIL Take 1 tablet (5 mg total) by mouth 3 (three) times daily as needed for muscle spasms.   docusate sodium 100 MG capsule Commonly known as:  COLACE Take 1 capsule (100 mg total) by mouth 2 (two) times daily.   feeding supplement (PRO-STAT SUGAR FREE 64) Liqd Take 30 mLs by mouth 3 (three) times daily between meals.   HYDROcodone-acetaminophen 5-325 MG tablet Commonly known as:  NORCO/VICODIN Take 1 tablet by mouth every 4 (four) hours as needed for moderate pain.   insulin glargine 100 UNIT/ML injection Commonly known as:  LANTUS Inject 0.3 mLs (30 Units total) into the skin daily.   metoprolol tartrate 100 MG tablet Commonly known as:  LOPRESSOR Take 1 tablet (100 mg total) by mouth 2 (two) times daily.   polyethylene glycol packet Commonly known as:  MIRALAX / GLYCOLAX Take 17 g by mouth daily as needed.   Rivaroxaban 15 & 20 MG Tbpk Take as directed on package: Start with one 51m tablet by mouth twice a day with food. On Day 22, switch to one 249mtablet once a day with food.       No orders of the defined types were placed in this encounter.    There is no immunization history on file for this patient.  Social History  Substance Use Topics  . Smoking status: Never Smoker  . Smokeless tobacco: Never Used  . Alcohol use No    Family history is   Family History  Problem Relation Age of Onset  . Hypertension Mother   . Stroke Mother   . Diabetes Father   . Renal Disease  Father   . Hypertension Sister   . Diabetes Sister   . Renal Disease Sister   . Iron deficiency Sister       Review of Systems  DATA OBTAINED: from patient, nurse GENERAL:  no fevers, fatigue, appetite changes SKIN: No itching, or rash EYES: No eye pain, redness, discharge EARS: No earache, tinnitus, change in hearing NOSE: No congestion, drainage or bleeding  MOUTH/THROAT: No mouth or tooth pain, No sore throat RESPIRATORY: No cough, wheezing, SOB CARDIAC: No chest pain, palpitations, lower extremity edema  GI: No abdominal pain, No N/V/D or constipation, No heartburn or reflux  GU: No dysuria, frequency or urgency, or incontinence  MUSCULOSKELETAL: No unrelieved bone/joint pain NEUROLOGIC: No headache, dizziness or focal weakness PSYCHIATRIC: No c/o anxiety or sadness  Vitals:   04/15/17 1031  BP: 129/72  Pulse: 64  Resp: 18  Temp: (!) 97.5 F (36.4 C)  SpO2: 97%    SpO2 Readings from Last 1 Encounters:  04/15/17 97%   Body mass index is 34.52 kg/m.     Physical Exam  GENERAL APPEARANCE: Alert, conversant,  No acute distress.  SKIN: No diaphoresis rash HEAD: Normocephalic, atraumatic  EYES: Conjunctiva/lids clear. Pupils round, reactive. EOMs intact.  EARS: External exam WNL, canals clear. Hearing grossly normal.  NOSE: No deformity or discharge.  MOUTH/THROAT: Lips w/o lesions  RESPIRATORY: Breathing is even, unlabored. Lung sounds are clear   CARDIOVASCULAR: Heart RRR no murmurs, rubs or gallops. No peripheral edema.   GASTROINTESTINAL: Abdomen is soft, non-tender, not distended w/ normal bowel sounds. GENITOURINARY: Bladder non tender, not distended  MUSCULOSKELETAL: No abnormal joints or musculature NEUROLOGIC:  Cranial nerves 2-12 grossly intact. Moves all extremities;Dressing right lower extremity  PSYCHIATRIC: Mood and affect appropriate to situation, no behavioral issues  Patient Active Problem List   Diagnosis Date Noted  . ARF (acute renal  failure) ([place])   . Diabetic ulcer of ankle ([place])   . Necrotizing fasciitis ([place])   . Cellulitis of right lower extremity   . Hyperglycemia due to type 2 diabetes mellitus ([place]) 03/31/2017  . Acute kidney injury superimposed on chronic kidney disease ([place]) 03/31/2017  . Hypoalbuminemia 03/31/2017  . Right leg DVT ([place]) 03/31/2017  . Diabetic ulcer of calf ([place]) 03/31/2017  . Noncompliance w/medication treatment due to intermit use of medication 03/31/2017  . Cellulitis of right lower leg 03/31/2017  . Sinus tachycardia 03/31/2017  . Fever 03/31/2017  . Chronic anticoagulation 03/31/2017  . Sepsis ([place]) 03/30/2017  . Type 2 diabetes mellitus with stage 2 chronic kidney disease, with long-term current use of insulin ([place]) 03/27/2017  . HTN (hypertension) 03/22/2017  . Diabetes ([place]) 03/22/2017      Labs reviewed: Basic Metabolic Panel:    Component Value Date/Time   NA 136 04/12/2017 0408   K 3.6 04/12/2017 0408   CL 103 04/12/2017 0408   CO2 25 04/12/2017 0408   GLUCOSE 114 (H) 04/12/2017 0408   BUN 21 (H) 04/12/2017 0408   CREATININE 1.53 (H) 04/12/2017 0408   CALCIUM 8.5 (L) 04/12/2017 0408   PROT 6.7 04/05/2017 0311   ALBUMIN 1.7 (L) 04/05/2017 0311   AST 14 (L) 04/05/2017 0311   ALT 14 (L) 04/05/2017 0311   ALKPHOS 64 04/05/2017 0311   BILITOT 0.4 04/05/2017 0311   GFRNONAA 53 (L) 04/12/2017 0408   GFRAA >60 04/12/2017 0408     Recent Labs  04/03/17 0322  04/04/17 0236  04/09/17 0403 04/10/17 0429 04/12/17 0408  NA  --   < > 134*  < > 136 137 136  K  --   < > 4.8  < > 3.7 3.7 3.6  CL  --   < > 102  < > 103 101 103  CO2  --   < > 22  < > '24 27 25  ' GLUCOSE  --   < > 299*  < > 119* 119* 114*  BUN  --   < > 21*  < > 19 20 21*  CREATININE  --   < > 1.77*  < > 1.43* 1.37* 1.53*  CALCIUM  --   < > 8.3*  < > 8.4* 8.6* 8.5*  MG 1.6*  --  2.2  --   --   --   --   < > =  values in this interval not displayed. Liver Function Tests:  Recent Labs  04/03/17 0332  04/04/17 0236 04/05/17 0311  AST 14* 18 14*  ALT 11* 14* 14*  ALKPHOS 69 72 64  BILITOT 0.7 0.7 0.4  PROT 6.9 7.3 6.7  ALBUMIN 1.7* 1.6* 1.7*   No results for input(s): LIPASE, AMYLASE in the last 8760 hours. No results for input(s): AMMONIA in the last 8760 hours. CBC:  Recent Labs  04/04/17 0236 04/05/17 0311 04/06/17 0250  04/09/17 0403 04/10/17 0429 04/12/17 0408  WBC 12.2* 12.2* 9.1  < > 6.2 5.3 5.4  NEUTROABS 11.0* 8.2* 5.7  --   --   --   --   HGB 11.9* 11.5* 10.6*  < > 9.4* 9.7* 9.0*  HCT 37.2* 35.4* 32.8*  < > 29.7* 30.2* 28.7*  MCV 88.6 87.4 89.1  < > 88.1 87.5 88.0  PLT 408* 474* 442*  < > 446* 500* 476*  < > = values in this interval not displayed. Lipid No results for input(s): CHOL, HDL, LDLCALC, TRIG in the last 8760 hours.  Cardiac Enzymes: No results for input(s): CKTOTAL, CKMB, CKMBINDEX, TROPONINI in the last 8760 hours. BNP: No results for input(s): BNP in the last 8760 hours. No results found for: [facility] Lab Results  Component Value Date   HGBA1C >15.5 (H) 03/31/2017   Lab Results  Component Value Date   TSH 1.084 03/31/2017   No results found for: VITAMINB12 No results found for: FOLATE No results found for: IRON, TIBC, FERRITIN  Imaging and Procedures obtained prior to SNF admission: Dg Tibia/fibula Right  Result Date: 03/30/2017 CLINICAL DATA:  Laceration of the calf a few months ago. Patient is on Xarelto for blood clot and has increased bleeding of the wound in the back of the right calf. Rule out free air. EXAM: RIGHT TIBIA AND FIBULA - 2 VIEW COMPARISON:  None. FINDINGS: There is generalized mild soft tissue induration and swelling of the right leg. There is a soft tissue laceration is seen along the lateral aspect of the leg at the junction of the middle and distal third. No soft tissue emphysema is seen. No acute osseous abnormality is noted. Distal intraosseous membrane ossifications are seen between the tibia and fibula.  IMPRESSION: Generalized soft tissue swelling of the right leg with soft tissue laceration at the junction of the middle and distal third laterally. No soft tissue emphysema or suspicious osseous abnormality. Electronically Signed   By: Ashley Royalty M.D.   On: 03/30/2017 22:38     Not all labs, radiology exams or other studies done during hospitalization come through on my EPIC note; however they are reviewed by me.    Assessment and   SEPSIS/NECROTIZING FASCIITIS RIGHT CALF-treated with IV antibiotics in the hospital and to be treated with IV Ancef 2 g every 8 hours until October 12 for a total of 4 weeks per ID. Patient was taken to the OR 2 times; first time for irrigation and debridement of a large abscess and a wound VAC placement, second time for repeat irrigation and debridement along with a split  thickness skin graft placement; wound cultures grew out MSSA and ESCHERICHIA COLI. Wound VAC to be changed 3 times a week SNF- patient admitted to skilled nursing facility for OT/PT, wound care, and IV antibiotics for 4 weeks.  ACUTE BLOOD LOSS ANEMIA postop-DC hemoglobin is 9.0; no transfusion was required, based on hemoglobins 13 SNF - will follow-up CBC  DIABETES MELLITUS TYPE II  UNCONTROLLED-patient had not been taking anything for a year; hemoglobin A1c 15.5 patient was on sliding scale insulin in the hospital but has been changed to Lantus on discharge SNF- plan to continue Lantus 30 units subcutaneous daily; will check CBGs every morning  HYPERTENSION SNF - controlled; plan to continue Lopressor 100 mg by mouth twice a day and Norvasc 10 mg by mouth daily  ACUTE ON CHRONIC kidney disease-appears baseline creatinine is 1.3-1.5 SNF - will follow-up BMP  RLE DVT SNF -continue 50 mg twice a day for 21 days then 20 mg daily for 3 months minimum  Time spent greater than 45 minutes;> 50% of time with patient was spent reviewing records, labs, tests and studies, counseling and developing  plan of care  Webb Silversmith D. Sheppard Coil, MD

## 2017-04-18 ENCOUNTER — Encounter: Payer: Self-pay | Admitting: Internal Medicine

## 2017-04-18 DIAGNOSIS — A4151 Sepsis due to Escherichia coli [E. coli]: Secondary | ICD-10-CM | POA: Insufficient documentation

## 2017-04-18 DIAGNOSIS — D62 Acute posthemorrhagic anemia: Secondary | ICD-10-CM | POA: Insufficient documentation

## 2017-04-18 HISTORY — DX: Acute posthemorrhagic anemia: D62

## 2017-04-18 HISTORY — DX: Sepsis due to Escherichia coli (e. coli): A41.51

## 2017-04-24 ENCOUNTER — Encounter: Payer: Self-pay | Admitting: Internal Medicine

## 2017-04-24 ENCOUNTER — Ambulatory Visit (INDEPENDENT_AMBULATORY_CARE_PROVIDER_SITE_OTHER): Payer: BLUE CROSS/BLUE SHIELD | Admitting: Internal Medicine

## 2017-04-24 VITALS — BP 143/87 | HR 93 | Temp 99.3°F | Ht 71.5 in | Wt 235.0 lb

## 2017-04-24 DIAGNOSIS — M726 Necrotizing fasciitis: Secondary | ICD-10-CM | POA: Diagnosis not present

## 2017-04-24 NOTE — Progress Notes (Signed)
RFV: hospital follow up for nec fasc to right lower leg  Patient ID: [name], male   DOB: Dec 12, 1970, 46 y.o.   MRN: [id number]  HPI [name] is a pleasant 46yo M with history of being hospitalized with sepsis due to necrotizing fasciitis of his right calf. He was admitted, debrided and treated with IV antibiotics in the hospital and to be treated with IV Ancef 2 g every 8 hours until October 12 for a total of 4 weeks per ID. Patient was taken to the OR 2 times; first time for irrigation and debridement of a large abscess and a wound VAC placement, second time for repeat irrigation and debridement along with a split  thickness skin graft placement; wound cultures grew out MSSA and ESCHERICHIA COLI. Wound VAC to be changed 3 times a week SNF- patient admitted to skilled nursing facility for OT/PT, wound care, and IV antibiotics for 4 weeks.  He has had intermittent fever and nightsweats but occurred roughly 1 week ago. He reports being a hot sleeper.  He showed me a picture of his leg. Sees dr duda tomorrow  bs under better control ranging from 72-213. No longer in the 300-400s.  No diarrhea, rash, and not having any difficulty with his picc line  Outpatient Encounter Prescriptions as of 04/24/2017  Medication Sig  . Amino Acids-Protein Hydrolys (FEEDING SUPPLEMENT, PRO-STAT SUGAR FREE 64,) LIQD Take 30 mLs by mouth 3 (three) times daily between meals.  Marland Kitchen amLODipine (NORVASC) 10 MG tablet Take 1 tablet (10 mg total) by mouth daily.  Marland Kitchen ceFAZolin (ANCEF) IVPB Inject 2 g into the vein every 8 (eight) hours. Indication:  Wound infection/necrotizig fascitis  Last Day of Therapy:  04/26/17 Labs - Once weekly:  CBC/D and BMP, Labs - Every other week:  ESR and CRP  . cyclobenzaprine (FLEXERIL) 5 MG tablet Take 1 tablet (5 mg total) by mouth 3 (three) times daily as needed for muscle spasms.  Marland Kitchen docusate sodium (COLACE) 100 MG capsule Take 1 capsule (100 mg total) by mouth 2 (two) times  daily.  . hydrALAZINE (APRESOLINE) 50 MG tablet Take 50 mg by mouth 3 (three) times daily.  Marland Kitchen HYDROcodone-acetaminophen (NORCO/VICODIN) 5-325 MG tablet Take 1 tablet by mouth every 4 (four) hours as needed for moderate pain.  Marland Kitchen insulin glargine (LANTUS) 100 UNIT/ML injection Inject 0.3 mLs (30 Units total) into the skin daily.  . metoprolol tartrate (LOPRESSOR) 100 MG tablet Take 1 tablet (100 mg total) by mouth 2 (two) times daily.  . polyethylene glycol (MIRALAX / GLYCOLAX) packet Take 17 g by mouth daily as needed.  . Rivaroxaban 15 & 20 MG TBPK Take as directed on package: Start with one 29m tablet by mouth twice a day with food. On Day 22, switch to one 223mtablet once a day with food.   No facility-administered encounter medications on file as of 04/24/2017.      Patient Active Problem List   Diagnosis Date Noted  . Escherichia coli sepsis (HCSan Bernardino10/10/2016  . Acute blood loss as cause of postoperative anemia 04/18/2017  . ARF (acute renal failure) (HCCol[facility]  . Diabetic ulcer of ankle (HCH[facility]  . Necrotizing fasciitis (HC[facility]  . Cellulitis of right lower extremity   . Hyperglycemia due to type 2 diabetes mellitus (HCColumb[facility] AFB09/16/2018  . Acute kidney injury superimposed on chronic kidney disease (HCCollege [facility]09/16/2018  . Hypoalbuminemia 03/31/2017  . Right leg DVT (HCKent09/16/2018  . Diabetic ulcer of calf (HCCom[facility]  03/31/2017  . Noncompliance w/medication treatment due to intermit use of medication 03/31/2017  . Cellulitis of right lower leg 03/31/2017  . Sinus tachycardia 03/31/2017  . Fever 03/31/2017  . Chronic anticoagulation 03/31/2017  . Sepsis ([place]) 03/30/2017  . Type 2 diabetes mellitus with stage 2 chronic kidney disease, with long-term current use of insulin ([place]) 03/27/2017  . HTN (hypertension) 03/22/2017  . Diabetes ([place]) 03/22/2017     Health Maintenance Due  Topic Date Due  . PNEUMOCOCCAL POLYSACCHARIDE VACCINE (1) 07/26/1973  . FOOT EXAM  07/26/1981  . OPHTHALMOLOGY EXAM   07/26/1981  . URINE MICROALBUMIN  07/26/1981  . TETANUS/TDAP  07/26/1990  . INFLUENZA VACCINE  02/13/2017     Review of Systems Per hpi otherwise 12 point ros is negative Physical Exam   BP (!) 143/87   Pulse 93   Temp 99.3 F (37.4 C) (Oral)   Ht 5' 11.5" (1.816 m)   Wt 235 lb (106.6 kg)   BMI 32.32 kg/m   Physical Exam  Constitutional: He is oriented to person, place, and time. He appears well-developed and well-nourished. No distress.  HENT:  Mouth/Throat: Oropharynx is clear and moist. No oropharyngeal exudate.  Cardiovascular: Normal rate, regular rhythm and normal heart sounds. Exam reveals no gallop and no friction rub.  No murmur heard.  Pulmonary/Chest: Effort normal and breath sounds normal. No respiratory distress. He has no wheezes.  Abdominal: Soft. Bowel sounds are normal. He exhibits no distension. There is no tenderness.  Lymphadenopathy:  He has no cervical adenopathy.  Neurological: He is alert and oriented to person, place, and time.  Skin: Skin is warm and dry. No rash noted. No erythema. Wound vac in place Psychiatric: He has a normal mood and affect. His behavior is normal.    CBC Lab Results  Component Value Date   WBC 5.4 04/12/2017   RBC 3.26 (L) 04/12/2017   HGB 9.0 (L) 04/12/2017   HCT 28.7 (L) 04/12/2017   PLT 476 (H) 04/12/2017   MCV 88.0 04/12/2017   MCH 27.6 04/12/2017   MCHC 31.4 04/12/2017   RDW 12.6 04/12/2017   LYMPHSABS 2.4 04/06/2017   MONOABS 0.8 04/06/2017   EOSABS 0.1 04/06/2017    BMET Lab Results  Component Value Date   NA 136 04/12/2017   K 3.6 04/12/2017   CL 103 04/12/2017   CO2 25 04/12/2017   GLUCOSE 114 (H) 04/12/2017   BUN 21 (H) 04/12/2017   CREATININE 1.53 (H) 04/12/2017   CALCIUM 8.5 (L) 04/12/2017   GFRNONAA 53 (L) 04/12/2017   GFRAA >60 04/12/2017   Lab Results  Component Value Date   ESRSEDRATE 108 (H) 03/30/2017   Lab Results  Component Value Date   ESRSEDRATE 112 (H) 04/24/2017   Lab  Results  Component Value Date   CRP 56.2 (H) 04/24/2017      Assessment and Plan  Will check sed rate crp cbc with diff and bmp  Will having him finish iv abtx til oct 12 and then plan to switch to oral abtx of keflex 597m tid. If his inflammatory markers are > 80 will likely extend iv abtx by addn 2 wks.   [facility] being seen by dr aSheppard Coil Addendum - will call [facility] to continue Iv abtx for 2-4 wk. Will see back in that time to follow progression of his wound

## 2017-04-25 ENCOUNTER — Ambulatory Visit (INDEPENDENT_AMBULATORY_CARE_PROVIDER_SITE_OTHER): Payer: BLUE CROSS/BLUE SHIELD | Admitting: Orthopedic Surgery

## 2017-04-25 ENCOUNTER — Encounter (INDEPENDENT_AMBULATORY_CARE_PROVIDER_SITE_OTHER): Payer: Self-pay | Admitting: Orthopedic Surgery

## 2017-04-25 ENCOUNTER — Telehealth (INDEPENDENT_AMBULATORY_CARE_PROVIDER_SITE_OTHER): Payer: Self-pay | Admitting: Radiology

## 2017-04-25 DIAGNOSIS — M726 Necrotizing fasciitis: Secondary | ICD-10-CM

## 2017-04-25 LAB — BASIC METABOLIC PANEL
BUN: 17 mg/dL (ref 7–25)
CO2: 26 mmol/L (ref 20–32)
Calcium: 8.7 mg/dL (ref 8.6–10.3)
Chloride: 100 mmol/L (ref 98–110)
Creat: 1.29 mg/dL (ref 0.60–1.35)
Glucose, Bld: 176 mg/dL — ABNORMAL HIGH (ref 65–99)
Potassium: 4.7 mmol/L (ref 3.5–5.3)
Sodium: 135 mmol/L (ref 135–146)

## 2017-04-25 LAB — C-REACTIVE PROTEIN: CRP: 56.2 mg/L — ABNORMAL HIGH (ref <8.0)

## 2017-04-25 LAB — CBC WITH DIFFERENTIAL/PLATELET
Basophils Absolute: 17 cells/uL (ref 0–200)
Basophils Relative: 0.3 %
Eosinophils Absolute: 389 cells/uL (ref 15–500)
Eosinophils Relative: 6.7 %
HCT: 29.4 % — ABNORMAL LOW (ref 38.5–50.0)
Hemoglobin: 9.8 g/dL — ABNORMAL LOW (ref 13.2–17.1)
Lymphs Abs: 1282 cells/uL (ref 850–3900)
MCH: 28.9 pg (ref 27.0–33.0)
MCHC: 33.3 g/dL (ref 32.0–36.0)
MCV: 86.7 fL (ref 80.0–100.0)
MPV: 10.2 fL (ref 7.5–12.5)
Monocytes Relative: 8.6 %
Neutro Abs: 3613 cells/uL (ref 1500–7800)
Neutrophils Relative %: 62.3 %
Platelets: 263 10*3/uL (ref 140–400)
RBC: 3.39 10*6/uL — ABNORMAL LOW (ref 4.20–5.80)
RDW: 13.6 % (ref 11.0–15.0)
Total Lymphocyte: 22.1 %
WBC mixed population: 499 cells/uL (ref 200–950)
WBC: 5.8 10*3/uL (ref 3.8–10.8)

## 2017-04-25 LAB — SEDIMENTATION RATE: Sed Rate: 112 mm/h — ABNORMAL HIGH (ref 0–15)

## 2017-04-25 NOTE — Progress Notes (Signed)
Office Visit Note   Patient: [name]           Date of Birth: Nov 23, 1970           MRN: [id number] Visit Date: 04/25/2017              Requested by: Tracie Harrier, MD [address] [facility] [place], [place] [address] PCP: Tracie Harrier, MD  Chief Complaint  Patient presents with  . Right Leg - Routine Post Op    04/10/17 Right Leg SGST       HPI: Patient is a 46 year old gentleman in skilled nursing status post wound debridement for necrotizing fasciitis right leg currently with a wound VAC status post allograft skin graft. Patient complains of pain with dressing changes.  Assessment & Plan: Visit Diagnoses:  1. Necrotizing fasciitis of lower leg (HCC)     Plan: Recommended Adaptic or Mepitel dressing over the wound bed followed by the wound VAC. Recommended elevation patient has significant swelling from his leg being dependent.  Follow-Up Instructions: Return in about 2 weeks (around 05/09/2017).   Ortho Exam  Patient is alert, oriented, no adenopathy, well-dressed, normal affect, normal respiratory effort. On examination patient has the wound VAC sponge directly against the wound. This was removed there is good healthy beefy granulation tissue in the wound bed there is clear serosanguineous drainage. Adaptic plus a dry dressing was applied to decrease the maceration. Patient will need a new wound VAC applied tomorrow.  Imaging: No results found. No images are attached to the encounter.  Labs: Lab Results  Component Value Date   HGBA1C >15.5 (H) 03/31/2017   ESRSEDRATE 112 (H) 04/24/2017   ESRSEDRATE 108 (H) 03/30/2017   CRP 56.2 (H) 04/24/2017   CRP 27.2 (H) 03/30/2017   REPTSTATUS 04/07/2017 FINAL 04/05/2017   GRAMSTAIN  04/03/2017    ABUNDANT WBC PRESENT, PREDOMINANTLY PMN FEW GRAM POSITIVE COCCI IN CLUSTERS RARE GRAM NEGATIVE RODS Gram Stain Report Called to,Read Back By and Verified With: A Sheryn Bison RN [phone] 04/03/17 A  BROWNING    CULT RARE NORMAL NASOPHARYNGEAL FLORA 04/05/2017   LABORGA STAPHYLOCOCCUS AUREUS 04/03/2017   LABORGA ESCHERICHIA COLI 04/03/2017    Orders:  No orders of the defined types were placed in this encounter.  No orders of the defined types were placed in this encounter.    Procedures: No procedures performed  Clinical Data: No additional findings.  ROS:  All other systems negative, except as noted in the HPI. Review of Systems  Objective: Vital Signs: There were no vitals taken for this visit.  Specialty Comments:  No specialty comments available.  PMFS History: Patient Active Problem List   Diagnosis Date Noted  . Escherichia coli sepsis ([place]) 04/18/2017  . Acute blood loss as cause of postoperative anemia 04/18/2017  . ARF (acute renal failure) ([place])   . Diabetic ulcer of ankle ([place])   . Necrotizing fasciitis of lower leg ([place])   . Cellulitis of right lower extremity   . Hyperglycemia due to type 2 diabetes mellitus ([place]) 03/31/2017  . Acute kidney injury superimposed on chronic kidney disease ([place]) 03/31/2017  . Hypoalbuminemia 03/31/2017  . Right leg DVT ([place]) 03/31/2017  . Diabetic ulcer of calf ([place]) 03/31/2017  . Noncompliance w/medication treatment due to intermit use of medication 03/31/2017  . Cellulitis of right lower leg 03/31/2017  . Sinus tachycardia 03/31/2017  . Fever 03/31/2017  . Chronic anticoagulation 03/31/2017  . Sepsis ([place]) 03/30/2017  . Type 2 diabetes  mellitus with stage 2 chronic kidney disease, with long-term current use of insulin ([place]) 03/27/2017  . HTN (hypertension) 03/22/2017  . Diabetes ([place]) 03/22/2017   Past Medical History:  Diagnosis Date  . ARF (acute renal failure) ([place])   . Cellulitis of right lower leg 03/31/2017  . Chronic anticoagulation 03/31/2017  . Diabetes mellitus without complication ([place])   . Diabetic ulcer of calf ([place]) 03/31/2017  . DVT of proximal lower limb ([place]) 03/20/2017   R calf area  . Gout    . Hypertension   . Hypoalbuminemia 03/31/2017  . Necrotizing fasciitis ([place])   . Right leg DVT ([place]) 03/31/2017  . Sinus tachycardia 03/31/2017  . Type 2 diabetes mellitus with stage 2 chronic kidney disease, with long-term current use of insulin ([place]) 03/27/2017    Family History  Problem Relation Age of Onset  . Hypertension Mother   . Stroke Mother   . Diabetes Father   . Renal Disease Father   . Hypertension Sister   . Diabetes Sister   . Renal Disease Sister   . Iron deficiency Sister     Past Surgical History:  Procedure Laterality Date  . I&D EXTREMITY Right 04/03/2017   Procedure: IRRIGATION AND DEBRIDEMENT RIGHT LEG, APPLY WOUND VAC;  Surgeon: Newt Minion, MD;  Location: [facility];  Service: Orthopedics;  Laterality: Right;  . I&D EXTREMITY Right 04/05/2017   Procedure: IRRIGATION AND DEBRIDEMENT RIGHT LEG;  Surgeon: Newt Minion, MD;  Location: [facility];  Service: Orthopedics;  Laterality: Right;  . SCROTAL SURGERY  2006  . SKIN SPLIT GRAFT Right 04/10/2017   Procedure: SKIN GRAFT SPLIT THICKNESS RIGHT LEG;  Surgeon: Newt Minion, MD;  Location: [facility];  Service: Orthopedics;  Laterality: Right;   Social History   Occupational History  . Not on file.   Social History Main Topics  . Smoking status: Never Smoker  . Smokeless tobacco: Never Used  . Alcohol use No  . Drug use: No  . Sexual activity: No

## 2017-04-26 NOTE — Telephone Encounter (Signed)
Patient asked about [employer] paperwork yesterday, this was sent to [employer] he believes and was never filled out. Advised if it was for short term disability, we would fill this out for him. Advised him to fax it to our office, if it is a different form that the hospital would require to fill out we will try to get it to the right person for him. Advised that is most likely something we can do for him.

## 2017-05-07 ENCOUNTER — Ambulatory Visit (INDEPENDENT_AMBULATORY_CARE_PROVIDER_SITE_OTHER): Payer: BLUE CROSS/BLUE SHIELD | Admitting: Internal Medicine

## 2017-05-07 ENCOUNTER — Encounter: Payer: Self-pay | Admitting: Internal Medicine

## 2017-05-07 DIAGNOSIS — M726 Necrotizing fasciitis: Secondary | ICD-10-CM | POA: Diagnosis not present

## 2017-05-07 NOTE — Progress Notes (Signed)
[facility] for Infectious Disease  Patient Active Problem List   Diagnosis Date Noted  . Necrotizing fasciitis of lower leg (HCC)     Priority: High  . Escherichia coli sepsis ([place]) 04/18/2017  . Acute blood loss as cause of postoperative anemia 04/18/2017  . ARF (acute renal failure) ([place])   . Diabetic ulcer of ankle ([place])   . Cellulitis of right lower extremity   . Hyperglycemia due to type 2 diabetes mellitus ([place]) 03/31/2017  . Acute kidney injury superimposed on chronic kidney disease ([place]) 03/31/2017  . Hypoalbuminemia 03/31/2017  . Right leg DVT ([place]) 03/31/2017  . Diabetic ulcer of calf ([place]) 03/31/2017  . Noncompliance w/medication treatment due to intermit use of medication 03/31/2017  . Cellulitis of right lower leg 03/31/2017  . Sinus tachycardia 03/31/2017  . Fever 03/31/2017  . Chronic anticoagulation 03/31/2017  . Sepsis ([place]) 03/30/2017  . Type 2 diabetes mellitus with stage 2 chronic kidney disease, with long-term current use of insulin ([place]) 03/27/2017  . HTN (hypertension) 03/22/2017  . Diabetes ([place]) 03/22/2017    Patient's Medications  New Prescriptions   No medications on file  Previous Medications   AMINO ACIDS-PROTEIN HYDROLYS (FEEDING SUPPLEMENT, PRO-STAT SUGAR FREE 64,) LIQD    Take 30 mLs by mouth 3 (three) times daily between meals.   AMLODIPINE (NORVASC) 10 MG TABLET    Take 1 tablet (10 mg total) by mouth daily.   CYCLOBENZAPRINE (FLEXERIL) 5 MG TABLET    Take 1 tablet (5 mg total) by mouth 3 (three) times daily as needed for muscle spasms.   DOCUSATE SODIUM (COLACE) 100 MG CAPSULE    Take 1 capsule (100 mg total) by mouth 2 (two) times daily.   HYDRALAZINE (APRESOLINE) 50 MG TABLET    Take 50 mg by mouth 3 (three) times daily.   HYDROCODONE-ACETAMINOPHEN (NORCO/VICODIN) 5-325 MG TABLET    Take 1 tablet by mouth every 4 (four) hours as needed for moderate pain.   INSULIN GLARGINE (LANTUS) 100 UNIT/ML INJECTION    Inject 0.3 mLs (30  Units total) into the skin daily.   METOPROLOL TARTRATE (LOPRESSOR) 100 MG TABLET    Take 1 tablet (100 mg total) by mouth 2 (two) times daily.   POLYETHYLENE GLYCOL (MIRALAX / GLYCOLAX) PACKET    Take 17 g by mouth daily as needed.   RIVAROXABAN 15 & 20 MG TBPK    Take as directed on package: Start with one 15mg  tablet by mouth twice a day with food. On Day 22, switch to one 20mg  tablet once a day with food.  Modified Medications   No medications on file  Discontinued Medications   No medications on file    Subjective: [name]. [name] is in for his routine follow-up visit. He was hospitalized last month with necrotizing fasciitis of his right lower leg and Escherichia coli bacteremia. He underwent incision and drainage and was discharged on IV cefazolin. Initial plan was to treat for 3 weeks. He was seen back by my partner, Dr. Carlyle Price on 04/24/2017. His sedimentation rate and C-reactive protein remained elevated so she extended therapy. He has now completed 39 days of IV antibiotic therapy. He has had no problems tolerating his antibiotic or PICC. When he was seen by Dr. Meridee Price on 04/25/2017, Dr. Sharol Price described the wound as having "good healthy beefy granulation tissue in the wound bed. There is clear serosanguineous drainage". [name]. [name] says the wound has not been getting  any smaller but that it continues to be beefy red. He has not had any fever, chills or sweats. He does have some pain when his leg is hanging down on the floor.  Review of Systems: Review of Systems  Constitutional: Negative for chills, diaphoresis and fever.  Gastrointestinal: Positive for constipation. Negative for abdominal pain, diarrhea, nausea and vomiting.  Musculoskeletal: Positive for joint pain.    Past Medical History:  Diagnosis Date  . ARF (acute renal failure) ([place])   . Cellulitis of right lower leg 03/31/2017  . Chronic anticoagulation 03/31/2017  . Diabetes mellitus without complication ([place])    . Diabetic ulcer of calf ([place]) 03/31/2017  . DVT of proximal lower limb ([place]) 03/20/2017   R calf area  . Gout   . Hypertension   . Hypoalbuminemia 03/31/2017  . Necrotizing fasciitis ([place])   . Right leg DVT ([place]) 03/31/2017  . Sinus tachycardia 03/31/2017  . Type 2 diabetes mellitus with stage 2 chronic kidney disease, with long-term current use of insulin ([place]) 03/27/2017    Social History  Substance Use Topics  . Smoking status: Never Smoker  . Smokeless tobacco: Never Used  . Alcohol use No    Family History  Problem Relation Age of Onset  . Hypertension Mother   . Stroke Mother   . Diabetes Father   . Renal Disease Father   . Hypertension Sister   . Diabetes Sister   . Renal Disease Sister   . Iron deficiency Sister     No Known Allergies  Objective: Vitals:   05/07/17 1415  BP: 115/77  Pulse: 86  Temp: 99.3 F (37.4 C)  TempSrc: Oral   There is no height or weight on file to calculate BMI.  Physical Exam  Constitutional: He is oriented to person, place, and time.  He is very pleasant and in no distress. He is seated in a wheelchair.  Musculoskeletal:  He has a VAC wound dressing on his lower, lateral right calf extending from his ankle to mid shin.  Neurological: He is alert and oriented to person, place, and time.  Skin:  Right arm PICC site looks good.  Psychiatric: Affect normal.    Lab Results    Problem List Items Addressed This Visit      High   Necrotizing fasciitis of lower leg ([place])    I suspect that his necrotizing fasciitis and bacteremia have now been cured through a combination of surgical debridement, wound care and nearly 6 weeks of IV antibiotic therapy. I will stop cefazolin and have a PICC removed today. He will follow-up here in 4 weeks.      Relevant Orders   PICC line removal       Michel Bickers, MD [facility] for [facility] [phone] pager   [phone] cell 05/07/2017, 2:41 PM

## 2017-05-07 NOTE — Progress Notes (Signed)
RN received verbal order to discontinue the patient's PICC line.  Patient identified with name and date of birth. PICC dressing removed, site unremarkable.  PICC line removed using sterile procedure @ 1430. PICC length equal to that noted in patient's hospital chart of 42 cm. Sterile petroleum gauze + sterile 4X4 applied to PICC site, pressure applied for 10 minutes and covered with Medipore tape as a pressure dressing. Patient tolerated procedure without complaints.  Patient instructed to limit use of arm for 1 hour. Patient instructed that the pressure dressing should remain in place for 24 hours. Patient verbalized understanding of these instructions.

## 2017-05-07 NOTE — Assessment & Plan Note (Signed)
I suspect that his necrotizing fasciitis and bacteremia have now been cured through a combination of surgical debridement, wound care and nearly 6 weeks of IV antibiotic therapy. I will stop cefazolin and have a PICC removed today. He will follow-up here in 4 weeks.

## 2017-05-09 ENCOUNTER — Ambulatory Visit (INDEPENDENT_AMBULATORY_CARE_PROVIDER_SITE_OTHER): Payer: BLUE CROSS/BLUE SHIELD | Admitting: Orthopedic Surgery

## 2017-05-13 ENCOUNTER — Ambulatory Visit (INDEPENDENT_AMBULATORY_CARE_PROVIDER_SITE_OTHER): Payer: BLUE CROSS/BLUE SHIELD | Admitting: Orthopedic Surgery

## 2017-05-13 ENCOUNTER — Telehealth (INDEPENDENT_AMBULATORY_CARE_PROVIDER_SITE_OTHER): Payer: Self-pay | Admitting: Orthopedic Surgery

## 2017-05-13 ENCOUNTER — Encounter (INDEPENDENT_AMBULATORY_CARE_PROVIDER_SITE_OTHER): Payer: Self-pay | Admitting: Orthopedic Surgery

## 2017-05-13 ENCOUNTER — Non-Acute Institutional Stay (SKILLED_NURSING_FACILITY): Payer: BLUE CROSS/BLUE SHIELD | Admitting: Internal Medicine

## 2017-05-13 ENCOUNTER — Ambulatory Visit: Payer: BLUE CROSS/BLUE SHIELD | Admitting: Internal Medicine

## 2017-05-13 ENCOUNTER — Encounter: Payer: Self-pay | Admitting: Internal Medicine

## 2017-05-13 DIAGNOSIS — E1122 Type 2 diabetes mellitus with diabetic chronic kidney disease: Secondary | ICD-10-CM | POA: Diagnosis not present

## 2017-05-13 DIAGNOSIS — A4151 Sepsis due to Escherichia coli [E. coli]: Secondary | ICD-10-CM

## 2017-05-13 DIAGNOSIS — E11622 Type 2 diabetes mellitus with other skin ulcer: Secondary | ICD-10-CM | POA: Diagnosis not present

## 2017-05-13 DIAGNOSIS — N189 Chronic kidney disease, unspecified: Secondary | ICD-10-CM | POA: Diagnosis not present

## 2017-05-13 DIAGNOSIS — N182 Chronic kidney disease, stage 2 (mild): Secondary | ICD-10-CM

## 2017-05-13 DIAGNOSIS — I82431 Acute embolism and thrombosis of right popliteal vein: Secondary | ICD-10-CM | POA: Diagnosis not present

## 2017-05-13 DIAGNOSIS — D62 Acute posthemorrhagic anemia: Secondary | ICD-10-CM | POA: Diagnosis not present

## 2017-05-13 DIAGNOSIS — Z794 Long term (current) use of insulin: Secondary | ICD-10-CM

## 2017-05-13 DIAGNOSIS — E1165 Type 2 diabetes mellitus with hyperglycemia: Secondary | ICD-10-CM

## 2017-05-13 DIAGNOSIS — I10 Essential (primary) hypertension: Secondary | ICD-10-CM

## 2017-05-13 DIAGNOSIS — M726 Necrotizing fasciitis: Secondary | ICD-10-CM

## 2017-05-13 DIAGNOSIS — L97209 Non-pressure chronic ulcer of unspecified calf with unspecified severity: Secondary | ICD-10-CM

## 2017-05-13 DIAGNOSIS — N179 Acute kidney failure, unspecified: Secondary | ICD-10-CM | POA: Diagnosis not present

## 2017-05-13 DIAGNOSIS — A4101 Sepsis due to Methicillin susceptible Staphylococcus aureus: Secondary | ICD-10-CM

## 2017-05-13 NOTE — Telephone Encounter (Signed)
Patient has a new wound near the skin graph but transparent dressing is covering the new wound. Needs orders /advice or should after today she wants to know what to do moving forward. Please call her if no answer on 1st # call cell # in the comments

## 2017-05-13 NOTE — Progress Notes (Signed)
Office Visit Note   Patient: [name]           Date of Birth: [birth year]           MRN: [id number] Visit Date: 05/13/2017              Requested by: Tracie Harrier, MD [address] [facility] [place], [place] [address] PCP: Tracie Harrier, MD  Chief Complaint  Patient presents with  . Right Leg - Routine Post Op    04/10/17 Split Thickness Skin grafting right leg, secondary to necrotizing fasciitis.       HPI: Patient is a 46 year old gentleman who 4 weeks status post debridement and skin grafting for necrotizing fasciitis wound right leg.  Patient states that he will soon be discharged from skilled nursing.  He has completed approximately 5 weeks worth of IV antibiotics currently followed by Dr. Megan Salon and infectious disease.  Assessment & Plan: Visit Diagnoses:  1. Necrotizing fasciitis of lower leg ([place])     Plan: Will harvest the staples applied the black wound VAC sponge directly to the wound.  Use an Ace wrap to help with compression follow-up in the office in 2 weeks continue with home health nursing for wound VAC dressing changes 3 times a week.  Follow-Up Instructions: Return in about 2 weeks (around 05/27/2017).   Ortho Exam  Patient is alert, oriented, no adenopathy, well-dressed, normal affect, normal respiratory effort. Examination patient does have clear drainage from the wound.  There is 100% beefy granulation tissue no cellulitis no odor.  The wound is 22 cm in length 16 cm in width.  Imaging: No results found. No images are attached to the encounter.  Labs: Lab Results  Component Value Date   HGBA1C >15.5 (H) 03/31/2017   ESRSEDRATE 112 (H) 04/24/2017   ESRSEDRATE 108 (H) 03/30/2017   CRP 56.2 (H) 04/24/2017   CRP 27.2 (H) 03/30/2017   REPTSTATUS 04/07/2017 FINAL 04/05/2017   GRAMSTAIN  04/03/2017    ABUNDANT WBC PRESENT, PREDOMINANTLY PMN FEW GRAM POSITIVE COCCI IN CLUSTERS RARE GRAM NEGATIVE RODS Gram Stain  Report Called to,Read Back By and Verified With: A Sheryn Bison RN [phone] 04/03/17 A BROWNING    CULT RARE NORMAL NASOPHARYNGEAL FLORA 04/05/2017   LABORGA STAPHYLOCOCCUS AUREUS 04/03/2017   LABORGA ESCHERICHIA COLI 04/03/2017    Orders:  No orders of the defined types were placed in this encounter.  No orders of the defined types were placed in this encounter.    Procedures: No procedures performed  Clinical Data: No additional findings.  ROS:  All other systems negative, except as noted in the HPI. Review of Systems  Objective: Vital Signs: There were no vitals taken for this visit.  Specialty Comments:  No specialty comments available.  PMFS History: Patient Active Problem List   Diagnosis Date Noted  . Escherichia coli sepsis ([place]) 04/18/2017  . Acute blood loss as cause of postoperative anemia 04/18/2017  . ARF (acute renal failure) ([place])   . Diabetic ulcer of ankle ([place])   . Necrotizing fasciitis of lower leg ([place])   . Cellulitis of right lower extremity   . Hyperglycemia due to type 2 diabetes mellitus ([place]) 03/31/2017  . Acute kidney injury superimposed on chronic kidney disease ([place]) 03/31/2017  . Hypoalbuminemia 03/31/2017  . Right leg DVT ([place]) 03/31/2017  . Diabetic ulcer of calf ([place]) 03/31/2017  . Noncompliance w/medication treatment due to intermit use of medication 03/31/2017  . Cellulitis of right lower leg 03/31/2017  .  Sinus tachycardia 03/31/2017  . Fever 03/31/2017  . Chronic anticoagulation 03/31/2017  . Sepsis ([place]) 03/30/2017  . Type 2 diabetes mellitus with stage 2 chronic kidney disease, with long-term current use of insulin ([place]) 03/27/2017  . HTN (hypertension) 03/22/2017  . Diabetes ([place]) 03/22/2017   Past Medical History:  Diagnosis Date  . ARF (acute renal failure) ([place])   . Cellulitis of right lower leg 03/31/2017  . Chronic anticoagulation 03/31/2017  . Diabetes mellitus without complication ([place])   . Diabetic ulcer of calf ([place])  03/31/2017  . DVT of proximal lower limb ([place]) 03/20/2017   R calf area  . Gout   . Hypertension   . Hypoalbuminemia 03/31/2017  . Necrotizing fasciitis ([place])   . Right leg DVT ([place]) 03/31/2017  . Sinus tachycardia 03/31/2017  . Type 2 diabetes mellitus with stage 2 chronic kidney disease, with long-term current use of insulin ([place]) 03/27/2017    Family History  Problem Relation Age of Onset  . Hypertension Mother   . Stroke Mother   . Diabetes Father   . Renal Disease Father   . Hypertension Sister   . Diabetes Sister   . Renal Disease Sister   . Iron deficiency Sister     Past Surgical History:  Procedure Laterality Date  . I&D EXTREMITY Right 04/03/2017   Procedure: IRRIGATION AND DEBRIDEMENT RIGHT LEG, APPLY WOUND VAC;  Surgeon: Newt Minion, MD;  Location: [facility];  Service: Orthopedics;  Laterality: Right;  . I&D EXTREMITY Right 04/05/2017   Procedure: IRRIGATION AND DEBRIDEMENT RIGHT LEG;  Surgeon: Newt Minion, MD;  Location: [facility];  Service: Orthopedics;  Laterality: Right;  . SCROTAL SURGERY  2006  . SKIN SPLIT GRAFT Right 04/10/2017   Procedure: SKIN GRAFT SPLIT THICKNESS RIGHT LEG;  Surgeon: Newt Minion, MD;  Location: [facility];  Service: Orthopedics;  Laterality: Right;   Social History   Occupational History  . Not on file.   Social History Main Topics  . Smoking status: Never Smoker  . Smokeless tobacco: Never Used  . Alcohol use No  . Drug use: No  . Sexual activity: No

## 2017-05-13 NOTE — Telephone Encounter (Signed)
Duda patient °

## 2017-05-13 NOTE — Progress Notes (Signed)
Location:  [facility] Room Number: [place]:  SNF [phone])  Provider: Noah Delaine. Sheppard Coil, MD  PCP: Tracie Harrier, MD Patient Care Team: Tracie Harrier, MD as PCP - General (Internal Medicine)  Extended Emergency Contact Information Primary Emergency Contact: Ruthine Dose [place] Phone: [phone] Mobile Phone: [phone] Relation: Mother  No Known Allergies  Chief Complaint  Patient presents with  . Discharge Note    discharge from SNF to home    HPI:  46 y.o. male  With diabetes mellitus type 2, hypertension, DVT on xarelto, who presented to the ED with complaints of wound of his right leg. He was seen in the ED on 9/5 at which time he is found to have a DVT and started on xarelto. Since then he has discharge from his calf and worsened pain. His admitted to [facility] from 9/15-28 for sepsis secondary to necrotizing fasciitis of right calf. Patient was treated with broad-spectrum IV antibiotics and orthopedics was consulted. Patient went to the OR for irrigation and debridement as well as placement of a wound VAC for a large abscess. Wound was revisited on 9/26 when the patient underwent a skin graft split thickness to the right leg.wound cultures grew out MSSAand Escherichia coli in place and was placed on IV Ancef for a total of 4 weeks per ID. Hospital course was further, located by hyperglycemia, with a hemoglobin A1c of 15.5. Patient had been without treatment but was treatment was started his CBGs were well controlled. Also complicating hospitalization was acute blood loss anemia not requiring a transfusion and acute on chronic kidney disease which resolved to a baseline creatinine of 1.3-1.5. Patient was admitted to skilled nursing facility for OT/PT and is now ready to be discharged to home.    Past Medical History:  Diagnosis Date  . ARF (acute renal failure) ([place])   . Cellulitis of right  lower leg 03/31/2017  . Chronic anticoagulation 03/31/2017  . Diabetes mellitus without complication ([place])   . Diabetic ulcer of calf ([place]) 03/31/2017  . DVT of proximal lower limb ([place]) 03/20/2017   R calf area  . Gout   . Hypertension   . Hypoalbuminemia 03/31/2017  . Necrotizing fasciitis ([place])   . Right leg DVT ([place]) 03/31/2017  . Sinus tachycardia 03/31/2017  . Type 2 diabetes mellitus with stage 2 chronic kidney disease, with long-term current use of insulin ([place]) 03/27/2017    Past Surgical History:  Procedure Laterality Date  . SCROTAL SURGERY  2006     reports that  has never smoked. he has never used smokeless tobacco. He reports that he does not drink alcohol or use drugs. Social History   Socioeconomic History  . Marital status: Single    Spouse name: Not on file  . Number of children: Not on file  . Years of education: Not on file  . Highest education level: Not on file  Social Needs  . Financial resource strain: Not on file  . Food insecurity - worry: Not on file  . Food insecurity - inability: Not on file  . Transportation needs - medical: Not on file  . Transportation needs - non-medical: Not on file  Occupational History  . Not on file  Tobacco Use  . Smoking status: Never Smoker  . Smokeless tobacco: Never Used  Substance and Sexual Activity  . Alcohol use: No  . Drug use: No  . Sexual activity: No  Other  Topics Concern  . Not on file  Social History Narrative  . Not on file    Pertinent  Health Maintenance Due  Topic Date Due  . FOOT EXAM  07/26/1981  . OPHTHALMOLOGY EXAM  07/26/1981  . URINE MICROALBUMIN  07/26/1981  . INFLUENZA VACCINE  02/13/2017  . HEMOGLOBIN A1C  09/28/2017    Medications: Allergies as of 05/13/2017   No Known Allergies     Medication List        Accurate as of 05/13/17 11:59 PM. Always use your most recent med list.          amLODipine 10 MG tablet Commonly known as:  NORVASC Take 1 tablet (10 mg total) by  mouth daily.   cyclobenzaprine 5 MG tablet Commonly known as:  FLEXERIL Take 1 tablet (5 mg total) by mouth 3 (three) times daily as needed for muscle spasms.   docusate sodium 100 MG capsule Commonly known as:  COLACE Take 1 capsule (100 mg total) by mouth 2 (two) times daily.   feeding supplement (PRO-STAT SUGAR FREE 64) Liqd Take 30 mLs by mouth 3 (three) times daily between meals.   hydrALAZINE 50 MG tablet Commonly known as:  APRESOLINE Take 50 mg by mouth 3 (three) times daily.   HYDROcodone-acetaminophen 5-325 MG tablet Commonly known as:  NORCO/VICODIN Take 1 tablet by mouth. Take one tablet every 8 hours. Stop 05/14/17   insulin glargine 100 UNIT/ML injection Commonly known as:  LANTUS Inject 0.3 mLs (30 Units total) into the skin daily.   metoprolol tartrate 100 MG tablet Commonly known as:  LOPRESSOR Take 1 tablet (100 mg total) by mouth 2 (two) times daily.   NOVOLOG FLEXPEN 100 UNIT/ML FlexPen Generic drug:  insulin aspart Inject into the skin. Sliding scale 121-150 2 units; 151-200 3 units; 201-250 5 units; 251-300 8 units; 301-350 11 units; 351-400 15 units, grater than 400 - 15 units, call doctor.   polyethylene glycol packet Commonly known as:  MIRALAX / GLYCOLAX Take 17 g by mouth daily as needed.   rivaroxaban 20 MG Tabs tablet Commonly known as:  XARELTO Take 20 mg by mouth daily with supper.        Vitals:   05/13/17 1037  BP: (!) 146/82  Pulse: 84  Resp: 18  Temp: 98.1 F (36.7 C)  Weight: 221 lb (100.2 kg)  Height: 5' 11.5" (1.816 m)   Body mass index is 30.39 kg/m.  Physical Exam  GENERAL APPEARANCE: Alert, conversant. No acute distress.  HEENT: Unremarkable. RESPIRATORY: Breathing is even, unlabored. Lung sounds are clear   CARDIOVASCULAR: Heart RRR no murmurs, rubs or gallops. No peripheral edema.  GASTROINTESTINAL: Abdomen is soft, non-tender, not distended w/ normal bowel sounds.  NEUROLOGIC: Cranial nerves 2-12 grossly  intact. Moves all extremities   Labs reviewed: Basic Metabolic Panel: Recent Labs    04/03/17 0322  04/04/17 0236  04/10/17 0429 04/12/17 0408 04/24/17 1551  NA  --    < > 134*   < > 137 136 135  K  --    < > 4.8   < > 3.7 3.6 4.7  CL  --    < > 102   < > 101 103 100  CO2  --    < > 22   < > 27 25 26   GLUCOSE  --    < > 299*   < > 119* 114* 176*  BUN  --    < > 21*   < >  20 21* 17  CREATININE  --    < > 1.77*   < > 1.37* 1.53* 1.29  CALCIUM  --    < > 8.3*   < > 8.6* 8.5* 8.7  MG 1.6*  --  2.2  --   --   --   --    < > = values in this interval not displayed.   No results found for: [facility] Liver Function Tests: Recent Labs    04/03/17 0332 04/04/17 0236 04/05/17 0311  AST 14* 18 14*  ALT 11* 14* 14*  ALKPHOS 69 72 64  BILITOT 0.7 0.7 0.4  PROT 6.9 7.3 6.7  ALBUMIN 1.7* 1.6* 1.7*   No results for input(s): LIPASE, AMYLASE in the last 8760 hours. No results for input(s): AMMONIA in the last 8760 hours. CBC: Recent Labs    04/05/17 0311 04/06/17 0250  04/10/17 0429 04/12/17 0408 04/24/17 1551  WBC 12.2* 9.1   < > 5.3 5.4 5.8  NEUTROABS 8.2* 5.7  --   --   --  3,613  HGB 11.5* 10.6*   < > 9.7* 9.0* 9.8*  HCT 35.4* 32.8*   < > 30.2* 28.7* 29.4*  MCV 87.4 89.1   < > 87.5 88.0 86.7  PLT 474* 442*   < > 500* 476* 263   < > = values in this interval not displayed.   Lipid No results for input(s): CHOL, HDL, LDLCALC, TRIG in the last 8760 hours. Cardiac Enzymes: No results for input(s): CKTOTAL, CKMB, CKMBINDEX, TROPONINI in the last 8760 hours. BNP: No results for input(s): BNP in the last 8760 hours. CBG: Recent Labs    04/12/17 0809 04/12/17 1212 04/12/17 1635  GLUCAP 114* 127* 133*    Procedures and Imaging Studies During Stay: No results found.  Assessment/Plan:   Necrotizing fasciitis of lower leg (HCC)  Sepsis due to methicillin susceptible Staphylococcus aureus (HCC)  Escherichia coli sepsis (HCC)  Acute blood loss as cause of  postoperative anemia  Acute kidney injury superimposed on chronic kidney disease (HCC)  Type 2 diabetes mellitus with stage 2 chronic kidney disease, with long-term current use of insulin (HCC)  Type 2 diabetes mellitus with hyperglycemia, without long-term current use of insulin (HCC)  Diabetic ulcer of calf (HCC)  Acute deep vein thrombosis (DVT) of popliteal vein of right lower extremity ([place])  Essential hypertension   Patient is being discharged with the following home health services:  OT/PT/nursing  Patient is being discharged with the following durable medical equipment:  Wound VAC  Patient has been advised to f/u with their PCP in 1-2 weeks to bring them up to date on their rehab stay.  Social services at facility was responsible for arranging this appointment.  Pt was provided with a 30 day supply of prescriptions for medications and refills must be obtained from their PCP.  For controlled substances, a more limited supply may be provided adequate until PCP appointment only.  Patient's medications have been reconciled.   Time spent greater than 30 minutes;> 50% of time with patient was spent reviewing records, labs, tests and studies, counseling and developing plan of care  Noah Delaine. Sheppard Coil, MD

## 2017-05-14 ENCOUNTER — Telehealth (INDEPENDENT_AMBULATORY_CARE_PROVIDER_SITE_OTHER): Payer: Self-pay | Admitting: Orthopedic Surgery

## 2017-05-14 NOTE — Telephone Encounter (Signed)
I tried calling, but a child had answered the phone, after calling the facility, [name]'s son was brought to work. I could not find other number listed. I called and spoke with [facility] they did find patients consultation note from Dr. Sharol Given yesterday. If there is are any further questions she will have [name] call our office back. He should have addressed this in consultation visit, the wound vac orders and updated frequency.

## 2017-05-14 NOTE — Telephone Encounter (Signed)
Advised patient this morning I did fax FMLA to [employer] at [phone]. Advised him I just sent through landline fax just a couple minutes ago. Advised him also if she is needing to speak with someone I can explain to that this is not his fault our patient, this is an error is our system. Someone a FMLA form was scanned into our computer system, maybe from someone from hospital and it was not sent to anyone, therefore no one knew to fill it out. And the patient should not be penalized on our wrong doing.

## 2017-05-14 NOTE — Telephone Encounter (Signed)
Tricia from [facility] came by needing notes from whatever you have to state that the wound is getting better including measurements.  You can fax to [phone] as she needs these as soon as possible so patient can go home with wound vac.  Her # is [phone].

## 2017-05-14 NOTE — Telephone Encounter (Signed)
Patient called asking about his insurance papers for his short term disability, wondering if they had been faxed off yet? He said it was filled out yesterday here at the office. CB #  [phone]

## 2017-05-15 ENCOUNTER — Telehealth (INDEPENDENT_AMBULATORY_CARE_PROVIDER_SITE_OTHER): Payer: Self-pay | Admitting: Orthopedic Surgery

## 2017-05-15 ENCOUNTER — Telehealth (INDEPENDENT_AMBULATORY_CARE_PROVIDER_SITE_OTHER): Payer: Self-pay | Admitting: Radiology

## 2017-05-15 NOTE — Telephone Encounter (Signed)
[name] was unavailable by phone. We will fax orders instead to her attn to [phone]. Advised also over phone change from wet to dry to silvadene dressing changes per MD. Order placed in letters and faxed through [facility]

## 2017-05-15 NOTE — Telephone Encounter (Signed)
I did call patient advised due to increase in activity with leg hanging dependent, since he was discharged home today. This is most likely where his throbbing pain is coming from. Advised elevation level of heart, if he is no better with elevation by tomorrow, to call us back, may need compression if he is having too much swelling, would need to see him back in the office. Advised per [employer], insurance is still pending review for wound vac approval.

## 2017-05-15 NOTE — Telephone Encounter (Signed)
This was faxed yesterday to [employer] at landline fax.

## 2017-05-15 NOTE — Telephone Encounter (Signed)
Did call [name] and she did advise it is still pending approval from [facility]. They did receive office notes with measurements and wound care instructions.

## 2017-05-15 NOTE — Telephone Encounter (Signed)
[place] care nurse at [facility] called triage today, advising wound vac was denied. And needing new wound care orders, doing wet to dry dressing changes for now. I tried to call Gilmore Laroche from [facility] and left vm trying to find out why vac was denied by insurance. I did speak with Dr. Sharol Given and he would like silvadene dressing changes daily. I called [facility] to make them aware, so home health knows what to do upon his discharge.

## 2017-05-15 NOTE — Telephone Encounter (Signed)
Patient called advised he is having throbing pains in his right leg around the incision site. Patient said he came home without the wound vac. The number to contact patient is [phone]

## 2017-05-15 NOTE — Telephone Encounter (Signed)
Gilmore Laroche (wound vac nurse)  with [employer] returned call asked for a call back. The number to contact Gilmore Laroche is [phone]

## 2017-05-16 ENCOUNTER — Ambulatory Visit: Payer: BLUE CROSS/BLUE SHIELD | Admitting: Internal Medicine

## 2017-05-19 ENCOUNTER — Encounter: Payer: Self-pay | Admitting: Internal Medicine

## 2017-05-20 ENCOUNTER — Telehealth (INDEPENDENT_AMBULATORY_CARE_PROVIDER_SITE_OTHER): Payer: Self-pay | Admitting: Orthopedic Surgery

## 2017-05-20 NOTE — Telephone Encounter (Signed)
Dawn with [employer] wound vac called  Asked for a call back concerning patient. The number to contact Clare is [phone]

## 2017-05-20 NOTE — Telephone Encounter (Signed)
Received request for information from [employer] disability. We do not have pts authorization to release information. I faxed to [facility] advising to send auth so we can process request.[phone]

## 2017-05-20 NOTE — Telephone Encounter (Signed)
Left a voicemail for dawn and also a text regarding patient to see if vac was approved by insurance yet.

## 2017-05-21 ENCOUNTER — Telehealth (INDEPENDENT_AMBULATORY_CARE_PROVIDER_SITE_OTHER): Payer: Self-pay | Admitting: Orthopedic Surgery

## 2017-05-21 NOTE — Telephone Encounter (Signed)
Jackquline Denmark) with [facility] called needing verbal orders for [facility] (PT) 2 WK 4 for Gait training, strengthening and balance. Cindee Salt also need clarification on the activities patient can do on his right lower leg. The number to contact erik is [phone]

## 2017-05-22 NOTE — Telephone Encounter (Signed)
I called and left vm for Cindee Salt about verbal.

## 2017-05-23 ENCOUNTER — Ambulatory Visit: Payer: BLUE CROSS/BLUE SHIELD | Admitting: Internal Medicine

## 2017-05-24 NOTE — Telephone Encounter (Signed)
[name],[name] K 1971-05-06  [facility]  Pt needs ointment not listed on med list

## 2017-05-27 ENCOUNTER — Ambulatory Visit (INDEPENDENT_AMBULATORY_CARE_PROVIDER_SITE_OTHER): Payer: BLUE CROSS/BLUE SHIELD | Admitting: Orthopedic Surgery

## 2017-05-27 ENCOUNTER — Encounter (INDEPENDENT_AMBULATORY_CARE_PROVIDER_SITE_OTHER): Payer: Self-pay | Admitting: Orthopedic Surgery

## 2017-05-27 DIAGNOSIS — M726 Necrotizing fasciitis: Secondary | ICD-10-CM

## 2017-05-27 MED ORDER — SILVER SULFADIAZINE 1 % EX CREA: 1.0000 "application " | TOPICAL_CREAM | Freq: Every day | CUTANEOUS | 0 refills | Status: DC

## 2017-05-27 NOTE — Telephone Encounter (Signed)
I called and spoke with Cindee Salt advised I was a little confused, not sure if he called. No name or other number was taken. Asked if patient was needing silvadene, he is going to call back with more information with pharmacy.

## 2017-05-27 NOTE — Progress Notes (Signed)
Post-Op Visit Note   Patient: [name]           Date of Birth: [birth year]           MRN: [id number] Visit Date: 05/27/2017 PCP: Tracie Harrier, MD  Chief Complaint:  Chief Complaint  Patient presents with  . Right Leg - Follow-up    HPI:  HPI Patient is a 46 year old gentleman seen today status post irrigation and debridement R leg for necrotizing fasciitis. Is followed by ID, Dr. Megan Salon. Has been having daily wet to dry dressings with HH.   Did not receive a prescription for Silvadene that was sent in after last visit.   Ortho Exam Examination patient does have serosanguinous drainage from the wound.  There is 100% beefy granulation tissue no cellulitis no odor.  Visit Diagnoses:  1. Necrotizing fasciitis of lower leg (HCC)     Plan: HH to continue with care, silvadene dressing changes 3 times a week. Follow up in office in 2 weeks.   Follow-Up Instructions: Return in about 2 weeks (around 06/10/2017).   Imaging: No results found.  Orders:  No orders of the defined types were placed in this encounter.  No orders of the defined types were placed in this encounter.    PMFS History: Patient Active Problem List   Diagnosis Date Noted  . Escherichia coli sepsis ([place]) 04/18/2017  . Acute blood loss as cause of postoperative anemia 04/18/2017  . ARF (acute renal failure) ([place])   . Diabetic ulcer of ankle ([place])   . Necrotizing fasciitis of lower leg ([place])   . Hyperglycemia due to type 2 diabetes mellitus ([place]) 03/31/2017  . Acute kidney injury superimposed on chronic kidney disease ([place]) 03/31/2017  . Hypoalbuminemia 03/31/2017  . Right leg DVT ([place]) 03/31/2017  . Diabetic ulcer of calf ([place]) 03/31/2017  . Noncompliance w/medication treatment due to intermit use of medication 03/31/2017  . Sinus tachycardia 03/31/2017  . Fever 03/31/2017  . Chronic anticoagulation 03/31/2017  . Sepsis ([place]) 03/30/2017  . Type 2 diabetes mellitus with stage 2  chronic kidney disease, with long-term current use of insulin ([place]) 03/27/2017  . HTN (hypertension) 03/22/2017  . Diabetes ([place]) 03/22/2017   Past Medical History:  Diagnosis Date  . ARF (acute renal failure) ([place])   . Cellulitis of right lower leg 03/31/2017  . Chronic anticoagulation 03/31/2017  . Diabetes mellitus without complication ([place])   . Diabetic ulcer of calf ([place]) 03/31/2017  . DVT of proximal lower limb ([place]) 03/20/2017   R calf area  . Gout   . Hypertension   . Hypoalbuminemia 03/31/2017  . Necrotizing fasciitis ([place])   . Right leg DVT ([place]) 03/31/2017  . Sinus tachycardia 03/31/2017  . Type 2 diabetes mellitus with stage 2 chronic kidney disease, with long-term current use of insulin ([employer]) 03/27/2017    Family History  Problem Relation Age of Onset  . Hypertension Mother   . Stroke Mother   . Diabetes Father   . Renal Disease Father   . Hypertension Sister   . Diabetes Sister   . Renal Disease Sister   . Iron deficiency Sister     Past Surgical History:  Procedure Laterality Date  . SCROTAL SURGERY  2006   Social History   Occupational History  . Not on file  Tobacco Use  . Smoking status: Never Smoker  . Smokeless tobacco: Never Used  Substance and Sexual Activity  . Alcohol use: No  . Drug use:  No  . Sexual activity: No

## 2017-06-03 ENCOUNTER — Other Ambulatory Visit (INDEPENDENT_AMBULATORY_CARE_PROVIDER_SITE_OTHER): Payer: Self-pay | Admitting: Family

## 2017-06-03 MED ORDER — SILVER SULFADIAZINE 1 % EX CREA: TOPICAL_CREAM | CUTANEOUS | 0 refills | Status: DC

## 2017-06-04 ENCOUNTER — Ambulatory Visit: Payer: BLUE CROSS/BLUE SHIELD | Admitting: Internal Medicine

## 2017-06-04 ENCOUNTER — Encounter: Payer: Self-pay | Admitting: Internal Medicine

## 2017-06-04 ENCOUNTER — Ambulatory Visit (INDEPENDENT_AMBULATORY_CARE_PROVIDER_SITE_OTHER): Payer: BLUE CROSS/BLUE SHIELD | Admitting: Internal Medicine

## 2017-06-04 DIAGNOSIS — M726 Necrotizing fasciitis: Secondary | ICD-10-CM | POA: Diagnosis not present

## 2017-06-04 NOTE — Progress Notes (Signed)
[facility] for Infectious Disease  Patient Active Problem List   Diagnosis Date Noted  . Necrotizing fasciitis of lower leg (HCC)     Priority: High  . Escherichia coli sepsis ([place]) 04/18/2017  . Acute blood loss as cause of postoperative anemia 04/18/2017  . ARF (acute renal failure) ([place])   . Diabetic ulcer of ankle ([place])   . Hyperglycemia due to type 2 diabetes mellitus ([place]) 03/31/2017  . Acute kidney injury superimposed on chronic kidney disease ([place]) 03/31/2017  . Hypoalbuminemia 03/31/2017  . Right leg DVT ([place]) 03/31/2017  . Diabetic ulcer of calf ([place]) 03/31/2017  . Noncompliance w/medication treatment due to intermit use of medication 03/31/2017  . Sinus tachycardia 03/31/2017  . Fever 03/31/2017  . Chronic anticoagulation 03/31/2017  . Sepsis ([place]) 03/30/2017  . Type 2 diabetes mellitus with stage 2 chronic kidney disease, with long-term current use of insulin ([place]) 03/27/2017  . HTN (hypertension) 03/22/2017  . Diabetes ([place]) 03/22/2017      Medication List        Accurate as of 06/04/17  3:08 PM. Always use your most recent med list.          amLODipine 10 MG tablet Commonly known as:  NORVASC Take 1 tablet (10 mg total) by mouth daily.   cyclobenzaprine 5 MG tablet Commonly known as:  FLEXERIL Take 1 tablet (5 mg total) by mouth 3 (three) times daily as needed for muscle spasms.   docusate sodium 100 MG capsule Commonly known as:  COLACE Take 1 capsule (100 mg total) by mouth 2 (two) times daily.   feeding supplement (PRO-STAT SUGAR FREE 64) Liqd Take 30 mLs by mouth 3 (three) times daily between meals.   hydrALAZINE 50 MG tablet Commonly known as:  APRESOLINE   HYDROcodone-acetaminophen 5-325 MG tablet Commonly known as:  NORCO/VICODIN   insulin glargine 100 UNIT/ML injection Commonly known as:  LANTUS Inject 0.3 mLs (30 Units total) into the skin daily.   metoprolol tartrate 100 MG tablet Commonly known as:   LOPRESSOR Take 1 tablet (100 mg total) by mouth 2 (two) times daily.   NOVOLOG FLEXPEN 100 UNIT/ML FlexPen Generic drug:  insulin aspart   polyethylene glycol packet Commonly known as:  MIRALAX / GLYCOLAX Take 17 g by mouth daily as needed.   rivaroxaban 20 MG Tabs tablet Commonly known as:  XARELTO   silver sulfADIAZINE 1 % cream Commonly known as:  SILVADENE APPLY EXTERNALLY TO THE AFFECTED AREA DAILY       Subjective: [name] is in for his routine follow-up visit. He had necrotizing fasciitis of his right lower leg complicated by Escherichia coli bacteremia. He completed 39 days of IV cefazolin one month ago. He continues to improve. He saw Dr. Sharol Given on 05/27/2017. He commented that the wound showed 100% beefy granulation tissue with no evidence of cellulitis.  Review of Systems: Review of Systems  Constitutional: Negative for chills, diaphoresis and fever.  Gastrointestinal: Negative for abdominal pain, diarrhea, nausea and vomiting.  Musculoskeletal: Negative for joint pain.    Past Medical History:  Diagnosis Date  . ARF (acute renal failure) ([place])   . Cellulitis of right lower leg 03/31/2017  . Chronic anticoagulation 03/31/2017  . Diabetes mellitus without complication ([place])   . Diabetic ulcer of calf ([place]) 03/31/2017  . DVT of proximal lower limb ([place]) 03/20/2017   R calf area  . Gout   . Hypertension   . Hypoalbuminemia 03/31/2017  .  Necrotizing fasciitis ([place])   . Right leg DVT ([place]) 03/31/2017  . Sinus tachycardia 03/31/2017  . Type 2 diabetes mellitus with stage 2 chronic kidney disease, with long-term current use of insulin ([place]) 03/27/2017    Social History   Tobacco Use  . Smoking status: Never Smoker  . Smokeless tobacco: Never Used  Substance Use Topics  . Alcohol use: No  . Drug use: No    Family History  Problem Relation Age of Onset  . Hypertension Mother   . Stroke Mother   . Diabetes Father   . Renal Disease Father   . Hypertension  Sister   . Diabetes Sister   . Renal Disease Sister   . Iron deficiency Sister     No Known Allergies  Objective: Vitals:   06/04/17 1445  BP: 135/90  Pulse: 85  Temp: 98.8 F (37.1 C)  TempSrc: Oral  Weight: 230 lb (104.3 kg)  Height: 5' 11.5" (1.816 m)   Body mass index is 31.63 kg/m.  Physical Exam  Constitutional: He is oriented to person, place, and time.  He is in good spirits.  Musculoskeletal:  He has a compressive wrap on his right lower leg to just below his knee.  Neurological: He is alert and oriented to person, place, and time.  Skin: No rash noted.  Psychiatric: Mood and affect normal.    Lab Results    Problem List Items Addressed This Visit      High   Necrotizing fasciitis of lower leg ([place])    His necrotizing fasciitis and bacteremia have been cured. He may follow-up here as needed.          Michel Bickers, MD [facility] [phone] pager   [phone] cell 06/04/2017, 3:08 PM

## 2017-06-04 NOTE — Assessment & Plan Note (Signed)
His necrotizing fasciitis and bacteremia have been cured. He may follow-up here as needed.

## 2017-06-07 ENCOUNTER — Other Ambulatory Visit: Payer: Self-pay | Admitting: Internal Medicine

## 2017-06-10 ENCOUNTER — Encounter (INDEPENDENT_AMBULATORY_CARE_PROVIDER_SITE_OTHER): Payer: Self-pay | Admitting: Orthopedic Surgery

## 2017-06-10 ENCOUNTER — Ambulatory Visit (INDEPENDENT_AMBULATORY_CARE_PROVIDER_SITE_OTHER): Payer: BLUE CROSS/BLUE SHIELD | Admitting: Orthopedic Surgery

## 2017-06-10 VITALS — Ht 71.0 in | Wt 230.0 lb

## 2017-06-10 DIAGNOSIS — M726 Necrotizing fasciitis: Secondary | ICD-10-CM

## 2017-06-10 NOTE — Progress Notes (Signed)
Office Visit Note   Patient: [name]           Date of Birth: 1971/06/15           MRN: [id number] Visit Date: 06/10/2017              Requested by: Tracie Harrier, MD [address] [facility] [place], [place] [address] PCP: Tracie Harrier, MD  Chief Complaint  Patient presents with  . Right Leg - Routine Post Op    04/05/17 I&D RLE placement STSG repeat STSG 04/10/17      HPI: Patient is a 46 year old gentleman status post debridement for necrotizing fasciitis lateral aspect of his right calf.  Patient states he has been doing the compression wraps 3 times a week he states that he tries to elevate his foot much as possible but was not able to elevate it much over the weekend due to the holidays.  Patient states that his average blood glucose is 208.  Patient is not sure what his A1c is.  Assessment & Plan: Visit Diagnoses:  1. Necrotizing fasciitis of lower leg (HCC)     Plan: Patient has increased clear drainage from the wound.  We will have him continue with elevation we will have him start doing dry dressing changes with compression twice a day.  Follow-up on Thursday.  Discussed that we may need to place him in the medical compression socks to be changed twice a day as well.  Discussed the importance of elevation to decrease the swelling.  Follow-Up Instructions: Return in about 1 week (around 06/17/2017).   Ortho Exam  Patient is alert, oriented, no adenopathy, well-dressed, normal affect, normal respiratory effort. Examination patient has healthy granulation tissue there is clear drainage there is some mild maceration from the drainage from the wound.  There is new epithelialization around the wound edges of approximately 5 mm.  The wound bed is flat.  The wound covers almost the entire lateral aspect of the right calf.  Patient's antibiotics have been discontinued by infectious disease.  Imaging: No results found. No images are attached to  the encounter.  Labs: Lab Results  Component Value Date   HGBA1C >15.5 (H) 03/31/2017   ESRSEDRATE 112 (H) 04/24/2017   ESRSEDRATE 108 (H) 03/30/2017   CRP 56.2 (H) 04/24/2017   CRP 27.2 (H) 03/30/2017   REPTSTATUS 04/07/2017 FINAL 04/05/2017   GRAMSTAIN  04/03/2017    ABUNDANT WBC PRESENT, PREDOMINANTLY PMN FEW GRAM POSITIVE COCCI IN CLUSTERS RARE GRAM NEGATIVE RODS Gram Stain Report Called to,Read Back By and Verified With: A Sheryn Bison RN [phone] 04/03/17 A BROWNING    CULT RARE NORMAL NASOPHARYNGEAL FLORA 04/05/2017   LABORGA STAPHYLOCOCCUS AUREUS 04/03/2017   LABORGA ESCHERICHIA COLI 04/03/2017    @LABSALLVALUES (HGBA1)@  @BMI1 @  Orders:  No orders of the defined types were placed in this encounter.  No orders of the defined types were placed in this encounter.    Procedures: No procedures performed  Clinical Data: No additional findings.  ROS:  All other systems negative, except as noted in the HPI. Review of Systems  Objective: Vital Signs: Ht 5\' 11"  (1.803 m)   Wt 230 lb (104.3 kg)   BMI 32.08 kg/m   Specialty Comments:  No specialty comments available.  PMFS History: Patient Active Problem List   Diagnosis Date Noted  . Escherichia coli sepsis ([place]) 04/18/2017  . Acute blood loss as cause of postoperative anemia 04/18/2017  . ARF (acute renal failure) ([place])   .  Diabetic ulcer of ankle ([place])   . Necrotizing fasciitis of lower leg ([place])   . Hyperglycemia due to type 2 diabetes mellitus ([place]) 03/31/2017  . Acute kidney injury superimposed on chronic kidney disease ([place]) 03/31/2017  . Hypoalbuminemia 03/31/2017  . Right leg DVT ([place]) 03/31/2017  . Diabetic ulcer of calf ([place]) 03/31/2017  . Noncompliance w/medication treatment due to intermit use of medication 03/31/2017  . Sinus tachycardia 03/31/2017  . Fever 03/31/2017  . Chronic anticoagulation 03/31/2017  . Sepsis ([place]) 03/30/2017  . Type 2 diabetes mellitus with stage 2 chronic kidney disease,  with long-term current use of insulin ([place]) 03/27/2017  . HTN (hypertension) 03/22/2017  . Diabetes ([place]) 03/22/2017   Past Medical History:  Diagnosis Date  . ARF (acute renal failure) ([place])   . Cellulitis of right lower leg 03/31/2017  . Chronic anticoagulation 03/31/2017  . Diabetes mellitus without complication ([place])   . Diabetic ulcer of calf ([place]) 03/31/2017  . DVT of proximal lower limb ([place]) 03/20/2017   R calf area  . Gout   . Hypertension   . Hypoalbuminemia 03/31/2017  . Necrotizing fasciitis ([place])   . Right leg DVT ([place]) 03/31/2017  . Sinus tachycardia 03/31/2017  . Type 2 diabetes mellitus with stage 2 chronic kidney disease, with long-term current use of insulin ([place]) 03/27/2017    Family History  Problem Relation Age of Onset  . Hypertension Mother   . Stroke Mother   . Diabetes Father   . Renal Disease Father   . Hypertension Sister   . Diabetes Sister   . Renal Disease Sister   . Iron deficiency Sister     Past Surgical History:  Procedure Laterality Date  . I&D EXTREMITY Right 04/03/2017   Procedure: IRRIGATION AND DEBRIDEMENT RIGHT LEG, APPLY WOUND VAC;  Surgeon: Newt Minion, MD;  Location: [facility];  Service: Orthopedics;  Laterality: Right;  . I&D EXTREMITY Right 04/05/2017   Procedure: IRRIGATION AND DEBRIDEMENT RIGHT LEG;  Surgeon: Newt Minion, MD;  Location: [facility];  Service: Orthopedics;  Laterality: Right;  . SCROTAL SURGERY  2006  . SKIN SPLIT GRAFT Right 04/10/2017   Procedure: SKIN GRAFT SPLIT THICKNESS RIGHT LEG;  Surgeon: Newt Minion, MD;  Location: [facility];  Service: Orthopedics;  Laterality: Right;   Social History   Occupational History  . Not on file  Tobacco Use  . Smoking status: Never Smoker  . Smokeless tobacco: Never Used  Substance and Sexual Activity  . Alcohol use: No  . Drug use: No  . Sexual activity: No

## 2017-06-11 ENCOUNTER — Other Ambulatory Visit: Payer: Self-pay | Admitting: Internal Medicine

## 2017-06-13 ENCOUNTER — Ambulatory Visit (INDEPENDENT_AMBULATORY_CARE_PROVIDER_SITE_OTHER): Payer: BLUE CROSS/BLUE SHIELD | Admitting: Orthopedic Surgery

## 2017-06-13 ENCOUNTER — Encounter (INDEPENDENT_AMBULATORY_CARE_PROVIDER_SITE_OTHER): Payer: Self-pay | Admitting: Orthopedic Surgery

## 2017-06-13 VITALS — Ht 71.0 in | Wt 230.0 lb

## 2017-06-13 DIAGNOSIS — M726 Necrotizing fasciitis: Secondary | ICD-10-CM

## 2017-06-13 NOTE — Progress Notes (Signed)
Office Visit Note   Patient: [name]           Date of Birth: [birth year]           MRN: [id number] Visit Date: 06/13/2017              Requested by: Tracie Harrier, MD [address] [facility] [place], [place] [address] PCP: Tracie Harrier, MD  Chief Complaint  Patient presents with  . Right Leg - Follow-up    04/10/17 right STSG following I&D necrotizing fasciitis.       HPI: Patient is a 59 old gentleman who presents in follow-up status post skin graft for necrotizing fasciitis lateral aspect of the right leg.  He is discontinue the Silvadene he is doing dry dressing changes twice a day.  Assessment & Plan: Visit Diagnoses:  1. Necrotizing fasciitis of lower leg (HCC)     Plan: I recommended patient start with the 2 layer 30-40 mm compression stocking treatment plan.  Patient states he cannot afford to get socks at this time we will have him continue with dry dressing changes continue elevation follow-up next week  Follow-Up Instructions: Return in about 1 week (around 06/20/2017).   Ortho Exam  Patient is alert, oriented, no adenopathy, well-dressed, normal affect, normal respiratory effort. Patient does have clear drainage there is no redness no cellulitis there is no odor.  The still has significant swelling with brawny skin color changes.  The wound bed is 100% beefy granulation tissue there is some superficial epithelialization around the wound edges.  Imaging: No results found. No images are attached to the encounter.  Labs: Lab Results  Component Value Date   HGBA1C >15.5 (H) 03/31/2017   ESRSEDRATE 112 (H) 04/24/2017   ESRSEDRATE 108 (H) 03/30/2017   CRP 56.2 (H) 04/24/2017   CRP 27.2 (H) 03/30/2017   REPTSTATUS 04/07/2017 FINAL 04/05/2017   GRAMSTAIN  04/03/2017    ABUNDANT WBC PRESENT, PREDOMINANTLY PMN FEW GRAM POSITIVE COCCI IN CLUSTERS RARE GRAM NEGATIVE RODS Gram Stain Report Called to,Read Back By and Verified With:  A Sheryn Bison RN [phone] 04/03/17 A BROWNING    CULT RARE NORMAL NASOPHARYNGEAL FLORA 04/05/2017   LABORGA STAPHYLOCOCCUS AUREUS 04/03/2017   LABORGA ESCHERICHIA COLI 04/03/2017    @LABSALLVALUES (HGBA1)@  @BMI1 @  Orders:  No orders of the defined types were placed in this encounter.  No orders of the defined types were placed in this encounter.    Procedures: No procedures performed  Clinical Data: No additional findings.  ROS:  All other systems negative, except as noted in the HPI. Review of Systems  Objective: Vital Signs: Ht 5\' 11"  (1.803 m)   Wt 230 lb (104.3 kg)   BMI 32.08 kg/m   Specialty Comments:  No specialty comments available.  PMFS History: Patient Active Problem List   Diagnosis Date Noted  . Escherichia coli sepsis ([place]) 04/18/2017  . Acute blood loss as cause of postoperative anemia 04/18/2017  . ARF (acute renal failure) ([place])   . Diabetic ulcer of ankle ([place])   . Necrotizing fasciitis of lower leg ([place])   . Hyperglycemia due to type 2 diabetes mellitus ([place]) 03/31/2017  . Acute kidney injury superimposed on chronic kidney disease ([place]) 03/31/2017  . Hypoalbuminemia 03/31/2017  . Right leg DVT ([place]) 03/31/2017  . Diabetic ulcer of calf ([place]) 03/31/2017  . Noncompliance w/medication treatment due to intermit use of medication 03/31/2017  . Sinus tachycardia 03/31/2017  . Fever 03/31/2017  . Chronic anticoagulation  03/31/2017  . Sepsis ([place]) 03/30/2017  . Type 2 diabetes mellitus with stage 2 chronic kidney disease, with long-term current use of insulin ([place]) 03/27/2017  . HTN (hypertension) 03/22/2017  . Diabetes ([place]) 03/22/2017   Past Medical History:  Diagnosis Date  . ARF (acute renal failure) ([place])   . Cellulitis of right lower leg 03/31/2017  . Chronic anticoagulation 03/31/2017  . Diabetes mellitus without complication ([place])   . Diabetic ulcer of calf ([place]) 03/31/2017  . DVT of proximal lower limb ([place]) 03/20/2017   R calf area  . Gout    . Hypertension   . Hypoalbuminemia 03/31/2017  . Necrotizing fasciitis ([place])   . Right leg DVT ([place]) 03/31/2017  . Sinus tachycardia 03/31/2017  . Type 2 diabetes mellitus with stage 2 chronic kidney disease, with long-term current use of insulin ([place]) 03/27/2017    Family History  Problem Relation Age of Onset  . Hypertension Mother   . Stroke Mother   . Diabetes Father   . Renal Disease Father   . Hypertension Sister   . Diabetes Sister   . Renal Disease Sister   . Iron deficiency Sister     Past Surgical History:  Procedure Laterality Date  . I&D EXTREMITY Right 04/03/2017   Procedure: IRRIGATION AND DEBRIDEMENT RIGHT LEG, APPLY WOUND VAC;  Surgeon: Newt Minion, MD;  Location: [facility];  Service: Orthopedics;  Laterality: Right;  . I&D EXTREMITY Right 04/05/2017   Procedure: IRRIGATION AND DEBRIDEMENT RIGHT LEG;  Surgeon: Newt Minion, MD;  Location: [facility];  Service: Orthopedics;  Laterality: Right;  . SCROTAL SURGERY  2006  . SKIN SPLIT GRAFT Right 04/10/2017   Procedure: SKIN GRAFT SPLIT THICKNESS RIGHT LEG;  Surgeon: Newt Minion, MD;  Location: [facility];  Service: Orthopedics;  Laterality: Right;   Social History   Occupational History  . Not on file  Tobacco Use  . Smoking status: Never Smoker  . Smokeless tobacco: Never Used  Substance and Sexual Activity  . Alcohol use: No  . Drug use: No  . Sexual activity: No

## 2017-06-26 ENCOUNTER — Encounter (INDEPENDENT_AMBULATORY_CARE_PROVIDER_SITE_OTHER): Payer: Self-pay | Admitting: Orthopedic Surgery

## 2017-06-26 ENCOUNTER — Ambulatory Visit (INDEPENDENT_AMBULATORY_CARE_PROVIDER_SITE_OTHER): Payer: BLUE CROSS/BLUE SHIELD | Admitting: Orthopedic Surgery

## 2017-06-26 VITALS — Ht 71.0 in | Wt 230.0 lb

## 2017-06-26 DIAGNOSIS — M726 Necrotizing fasciitis: Secondary | ICD-10-CM | POA: Diagnosis not present

## 2017-06-26 NOTE — Progress Notes (Signed)
Office Visit Note   Patient: [name]           Date of Birth: 07/20/1970           MRN: [id number] Visit Date: 06/26/2017              Requested by: Tracie Harrier, MD [address] [facility] [place], [place] [address] PCP: Tracie Harrier, MD  Chief Complaint  Patient presents with  . Right Leg - Routine Post Op    04/10/17 right leg STSG I&D necrotizing fasciitis       HPI: Patient is a 46 year old gentleman status post limb salvage intervention for necrotizing fasciitis of the right leg.  Patient has been doing compression wraps he has removed these placed his own dressing over the leg he states his leg has not been elevated it has been dependent and has increased drainage.  Assessment & Plan: Visit Diagnoses:  1. Necrotizing fasciitis of lower leg (HCC)     Plan: Discussed with the patient again the importance of elevation.  We will apply a Dynaflex wrap we will change this in the office on Friday and Monday.  We will follow-up with more close surveillance to ensure compliance.  Follow-Up Instructions: Return in about 2 days (around 06/28/2017).   Ortho Exam  Patient is alert, oriented, no adenopathy, well-dressed, normal affect, normal respiratory effort. Examination there is good epithelialization around the wound edges but there is still clear drainage with healthy beefy granulation tissue with the massive wound lateral aspect of the right leg.  Imaging: No results found. No images are attached to the encounter.  Labs: Lab Results  Component Value Date   HGBA1C >15.5 (H) 03/31/2017   ESRSEDRATE 112 (H) 04/24/2017   ESRSEDRATE 108 (H) 03/30/2017   CRP 56.2 (H) 04/24/2017   CRP 27.2 (H) 03/30/2017   REPTSTATUS 04/07/2017 FINAL 04/05/2017   GRAMSTAIN  04/03/2017    ABUNDANT WBC PRESENT, PREDOMINANTLY PMN FEW GRAM POSITIVE COCCI IN CLUSTERS RARE GRAM NEGATIVE RODS Gram Stain Report Called to,Read Back By and Verified With: A  Sheryn Bison RN [phone] 04/03/17 A BROWNING    CULT RARE NORMAL NASOPHARYNGEAL FLORA 04/05/2017   LABORGA STAPHYLOCOCCUS AUREUS 04/03/2017   LABORGA ESCHERICHIA COLI 04/03/2017    @LABSALLVALUES (HGBA1)@  Body mass index is 32.08 kg/m.  Orders:  No orders of the defined types were placed in this encounter.  No orders of the defined types were placed in this encounter.    Procedures: No procedures performed  Clinical Data: No additional findings.  ROS:  All other systems negative, except as noted in the HPI. Review of Systems  Objective: Vital Signs: Ht 5\' 11"  (1.803 m)   Wt 230 lb (104.3 kg)   BMI 32.08 kg/m   Specialty Comments:  No specialty comments available.  PMFS History: Patient Active Problem List   Diagnosis Date Noted  . Escherichia coli sepsis ([place]) 04/18/2017  . Acute blood loss as cause of postoperative anemia 04/18/2017  . ARF (acute renal failure) ([place])   . Diabetic ulcer of ankle ([place])   . Necrotizing fasciitis of lower leg ([place])   . Hyperglycemia due to type 2 diabetes mellitus ([place]) 03/31/2017  . Acute kidney injury superimposed on chronic kidney disease ([place]) 03/31/2017  . Hypoalbuminemia 03/31/2017  . Right leg DVT ([place]) 03/31/2017  . Diabetic ulcer of calf ([place]) 03/31/2017  . Noncompliance w/medication treatment due to intermit use of medication 03/31/2017  . Sinus tachycardia 03/31/2017  . Fever 03/31/2017  .  Chronic anticoagulation 03/31/2017  . Sepsis ([place]) 03/30/2017  . Type 2 diabetes mellitus with stage 2 chronic kidney disease, with long-term current use of insulin ([place]) 03/27/2017  . HTN (hypertension) 03/22/2017  . Diabetes ([place]) 03/22/2017   Past Medical History:  Diagnosis Date  . ARF (acute renal failure) ([place])   . Cellulitis of right lower leg 03/31/2017  . Chronic anticoagulation 03/31/2017  . Diabetes mellitus without complication ([place])   . Diabetic ulcer of calf ([place]) 03/31/2017  . DVT of proximal lower limb ([place]) 03/20/2017     R calf area  . Gout   . Hypertension   . Hypoalbuminemia 03/31/2017  . Necrotizing fasciitis ([place])   . Right leg DVT ([place]) 03/31/2017  . Sinus tachycardia 03/31/2017  . Type 2 diabetes mellitus with stage 2 chronic kidney disease, with long-term current use of insulin ([place]) 03/27/2017    Family History  Problem Relation Age of Onset  . Hypertension Mother   . Stroke Mother   . Diabetes Father   . Renal Disease Father   . Hypertension Sister   . Diabetes Sister   . Renal Disease Sister   . Iron deficiency Sister     Past Surgical History:  Procedure Laterality Date  . I&D EXTREMITY Right 04/03/2017   Procedure: IRRIGATION AND DEBRIDEMENT RIGHT LEG, APPLY WOUND VAC;  Surgeon: Newt Minion, MD;  Location: [facility];  Service: Orthopedics;  Laterality: Right;  . I&D EXTREMITY Right 04/05/2017   Procedure: IRRIGATION AND DEBRIDEMENT RIGHT LEG;  Surgeon: Newt Minion, MD;  Location: [facility];  Service: Orthopedics;  Laterality: Right;  . SCROTAL SURGERY  2006  . SKIN SPLIT GRAFT Right 04/10/2017   Procedure: SKIN GRAFT SPLIT THICKNESS RIGHT LEG;  Surgeon: Newt Minion, MD;  Location: [facility];  Service: Orthopedics;  Laterality: Right;   Social History   Occupational History  . Not on file  Tobacco Use  . Smoking status: Never Smoker  . Smokeless tobacco: Never Used  Substance and Sexual Activity  . Alcohol use: No  . Drug use: No  . Sexual activity: No

## 2017-06-28 ENCOUNTER — Encounter (INDEPENDENT_AMBULATORY_CARE_PROVIDER_SITE_OTHER): Payer: Self-pay | Admitting: Family

## 2017-06-28 ENCOUNTER — Ambulatory Visit (INDEPENDENT_AMBULATORY_CARE_PROVIDER_SITE_OTHER): Payer: BLUE CROSS/BLUE SHIELD | Admitting: Family

## 2017-06-28 VITALS — Ht 71.0 in | Wt 230.0 lb

## 2017-06-28 DIAGNOSIS — E11622 Type 2 diabetes mellitus with other skin ulcer: Secondary | ICD-10-CM

## 2017-06-28 DIAGNOSIS — L97209 Non-pressure chronic ulcer of unspecified calf with unspecified severity: Secondary | ICD-10-CM

## 2017-06-28 DIAGNOSIS — Z945 Skin transplant status: Secondary | ICD-10-CM

## 2017-06-28 NOTE — Progress Notes (Signed)
Office Visit Note   Patient: [name]           Date of Birth: 1971/01/31           MRN: [id number] Visit Date: 06/28/2017              Requested by: Tracie Harrier, MD [address] [facility] [place], [place] [address] PCP: Tracie Harrier, MD  Chief Complaint  Patient presents with  . Right Leg - Routine Post Op    04/10/17 RLE STSG necrotizing fasciitis       HPI: Patient is a 46 year old gentleman status post limb salvage intervention for necrotizing fasciitis of the right leg.  Patient has been having serial compression wraps.  Assessment & Plan: Visit Diagnoses:  No diagnosis found.  Plan: Discussed with the patient again the importance of elevation.  We will apply a Dynaflex wrap we will change this in the office on Monday.  We will follow-up with more close surveillance to ensure compliance.  Follow-Up Instructions: No Follow-up on file.   Ortho Exam  Patient is alert, oriented, no adenopathy, well-dressed, normal affect, normal respiratory effort. Examination there is good epithelialization around the wound edges. Bleeding today, no drainage with 100% healthy beefy granulation tissue with the massive wound lateral aspect of the right leg. No erythema, warmth or sign of infection.  Imaging: No results found. No images are attached to the encounter.  Labs: Lab Results  Component Value Date   HGBA1C >15.5 (H) 03/31/2017   ESRSEDRATE 112 (H) 04/24/2017   ESRSEDRATE 108 (H) 03/30/2017   CRP 56.2 (H) 04/24/2017   CRP 27.2 (H) 03/30/2017   REPTSTATUS 04/07/2017 FINAL 04/05/2017   GRAMSTAIN  04/03/2017    ABUNDANT WBC PRESENT, PREDOMINANTLY PMN FEW GRAM POSITIVE COCCI IN CLUSTERS RARE GRAM NEGATIVE RODS Gram Stain Report Called to,Read Back By and Verified With: A Sheryn Bison RN [phone] 04/03/17 A BROWNING    CULT RARE NORMAL NASOPHARYNGEAL FLORA 04/05/2017   LABORGA STAPHYLOCOCCUS AUREUS 04/03/2017   LABORGA ESCHERICHIA COLI 04/03/2017      @LABSALLVALUES (HGBA1)@  Body mass index is 32.08 kg/m.  Orders:  No orders of the defined types were placed in this encounter.  No orders of the defined types were placed in this encounter.    Procedures: No procedures performed  Clinical Data: No additional findings.  ROS:  All other systems negative, except as noted in the HPI. Review of Systems  Objective: Vital Signs: Ht 5\' 11"  (1.803 m)   Wt 230 lb (104.3 kg)   BMI 32.08 kg/m   Specialty Comments:  No specialty comments available.  PMFS History: Patient Active Problem List   Diagnosis Date Noted  . Escherichia coli sepsis ([place]) 04/18/2017  . Acute blood loss as cause of postoperative anemia 04/18/2017  . ARF (acute renal failure) ([place])   . Diabetic ulcer of ankle ([place])   . Necrotizing fasciitis of lower leg ([place])   . Hyperglycemia due to type 2 diabetes mellitus ([place]) 03/31/2017  . Acute kidney injury superimposed on chronic kidney disease ([place]) 03/31/2017  . Hypoalbuminemia 03/31/2017  . Right leg DVT ([place]) 03/31/2017  . Diabetic ulcer of calf ([place]) 03/31/2017  . Noncompliance w/medication treatment due to intermit use of medication 03/31/2017  . Sinus tachycardia 03/31/2017  . Fever 03/31/2017  . Chronic anticoagulation 03/31/2017  . Sepsis ([place]) 03/30/2017  . Type 2 diabetes mellitus with stage 2 chronic kidney disease, with long-term current use of insulin ([place]) 03/27/2017  . HTN (  hypertension) 03/22/2017  . Diabetes ([place]) 03/22/2017   Past Medical History:  Diagnosis Date  . ARF (acute renal failure) ([place])   . Cellulitis of right lower leg 03/31/2017  . Chronic anticoagulation 03/31/2017  . Diabetes mellitus without complication ([place])   . Diabetic ulcer of calf ([place]) 03/31/2017  . DVT of proximal lower limb ([place]) 03/20/2017   R calf area  . Gout   . Hypertension   . Hypoalbuminemia 03/31/2017  . Necrotizing fasciitis ([place])   . Right leg DVT ([place]) 03/31/2017  . Sinus tachycardia 03/31/2017   . Type 2 diabetes mellitus with stage 2 chronic kidney disease, with long-term current use of insulin ([place]) 03/27/2017    Family History  Problem Relation Age of Onset  . Hypertension Mother   . Stroke Mother   . Diabetes Father   . Renal Disease Father   . Hypertension Sister   . Diabetes Sister   . Renal Disease Sister   . Iron deficiency Sister     Past Surgical History:  Procedure Laterality Date  . I&D EXTREMITY Right 04/03/2017   Procedure: IRRIGATION AND DEBRIDEMENT RIGHT LEG, APPLY WOUND VAC;  Surgeon: Newt Minion, MD;  Location: [facility];  Service: Orthopedics;  Laterality: Right;  . I&D EXTREMITY Right 04/05/2017   Procedure: IRRIGATION AND DEBRIDEMENT RIGHT LEG;  Surgeon: Newt Minion, MD;  Location: [facility];  Service: Orthopedics;  Laterality: Right;  . SCROTAL SURGERY  2006  . SKIN SPLIT GRAFT Right 04/10/2017   Procedure: SKIN GRAFT SPLIT THICKNESS RIGHT LEG;  Surgeon: Newt Minion, MD;  Location: [facility];  Service: Orthopedics;  Laterality: Right;   Social History   Occupational History  . Not on file  Tobacco Use  . Smoking status: Never Smoker  . Smokeless tobacco: Never Used  Substance and Sexual Activity  . Alcohol use: No  . Drug use: No  . Sexual activity: No

## 2017-07-01 ENCOUNTER — Encounter (INDEPENDENT_AMBULATORY_CARE_PROVIDER_SITE_OTHER): Payer: Self-pay | Admitting: Family

## 2017-07-01 ENCOUNTER — Ambulatory Visit (INDEPENDENT_AMBULATORY_CARE_PROVIDER_SITE_OTHER): Payer: BLUE CROSS/BLUE SHIELD | Admitting: Orthopedic Surgery

## 2017-07-01 DIAGNOSIS — Z945 Skin transplant status: Secondary | ICD-10-CM

## 2017-07-01 DIAGNOSIS — M726 Necrotizing fasciitis: Secondary | ICD-10-CM

## 2017-07-01 DIAGNOSIS — E11622 Type 2 diabetes mellitus with other skin ulcer: Secondary | ICD-10-CM

## 2017-07-01 DIAGNOSIS — L97209 Non-pressure chronic ulcer of unspecified calf with unspecified severity: Secondary | ICD-10-CM

## 2017-07-01 NOTE — Progress Notes (Signed)
Office Visit Note   Patient: NAVEN GIAMBALVO           Date of Birth: 09/04/1970           MRN: [id number] Visit Date: 07/01/2017              Requested by: Tracie Harrier, MD [address] [facility] [place], [place] [address] PCP: Tracie Harrier, MD  No chief complaint on file.     HPI: Patient is a 46 year old gentleman status post limb salvage intervention for necrotizing fasciitis of the right leg.  Patient has been having serial compression wraps.  Assessment & Plan: Visit Diagnoses:  1. Diabetic ulcer of calf ([place])   2. Necrotizing fasciitis of lower leg (HCC)   3. S/P split thickness skin graft     Plan: Discussed with the patient again the importance of elevation.  We will apply a Dynaflex wrap we will change this in the office on Friday, will reapply wrap on 12/26 as well.    Follow-Up Instructions: Return in about 4 days (around 07/05/2017) for and 26th.   Ortho Exam  Patient is alert, oriented, no adenopathy, well-dressed, normal affect, normal respiratory effort. Examination there is good epithelialization around the wound edges. Bleeding today, no drainage with 100% healthy beefy granulation tissue with the massive wound lateral aspect of the right leg. No erythema, warmth or sign of infection.  Imaging: No results found. No images are attached to the encounter.  Labs: Lab Results  Component Value Date   HGBA1C >15.5 (H) 03/31/2017   ESRSEDRATE 112 (H) 04/24/2017   ESRSEDRATE 108 (H) 03/30/2017   CRP 56.2 (H) 04/24/2017   CRP 27.2 (H) 03/30/2017   REPTSTATUS 04/07/2017 FINAL 04/05/2017   GRAMSTAIN  04/03/2017    ABUNDANT WBC PRESENT, PREDOMINANTLY PMN FEW GRAM POSITIVE COCCI IN CLUSTERS RARE GRAM NEGATIVE RODS Gram Stain Report Called to,Read Back By and Verified With: A Sheryn Bison RN [phone] 04/03/17 A BROWNING    CULT RARE NORMAL NASOPHARYNGEAL FLORA 04/05/2017   LABORGA STAPHYLOCOCCUS AUREUS 04/03/2017   LABORGA ESCHERICHIA  COLI 04/03/2017    @LABSALLVALUES (HGBA1)@  There is no height or weight on file to calculate BMI.  Orders:  No orders of the defined types were placed in this encounter.  No orders of the defined types were placed in this encounter.    Procedures: No procedures performed  Clinical Data: No additional findings.  ROS:  All other systems negative, except as noted in the HPI. Review of Systems  Constitutional: Negative for chills and fever.  Cardiovascular: Positive for leg swelling.  Skin: Positive for wound. Negative for color change.    Objective: Vital Signs: There were no vitals taken for this visit.  Specialty Comments:  No specialty comments available.  PMFS History: Patient Active Problem List   Diagnosis Date Noted  . S/P split thickness skin graft 06/28/2017  . Escherichia coli sepsis ([place]) 04/18/2017  . Acute blood loss as cause of postoperative anemia 04/18/2017  . ARF (acute renal failure) ([place])   . Diabetic ulcer of ankle ([place])   . Necrotizing fasciitis of lower leg ([place])   . Hyperglycemia due to type 2 diabetes mellitus ([place]) 03/31/2017  . Acute kidney injury superimposed on chronic kidney disease ([place]) 03/31/2017  . Hypoalbuminemia 03/31/2017  . Right leg DVT ([place]) 03/31/2017  . Diabetic ulcer of calf ([place]) 03/31/2017  . Noncompliance w/medication treatment due to intermit use of medication 03/31/2017  . Sinus tachycardia 03/31/2017  . Fever  03/31/2017  . Chronic anticoagulation 03/31/2017  . Sepsis ([place]) 03/30/2017  . Type 2 diabetes mellitus with stage 2 chronic kidney disease, with long-term current use of insulin ([place]) 03/27/2017  . HTN (hypertension) 03/22/2017  . Diabetes ([place]) 03/22/2017   Past Medical History:  Diagnosis Date  . ARF (acute renal failure) ([place])   . Cellulitis of right lower leg 03/31/2017  . Chronic anticoagulation 03/31/2017  . Diabetes mellitus without complication ([place])   . Diabetic ulcer of calf ([place]) 03/31/2017  .  DVT of proximal lower limb ([place]) 03/20/2017   R calf area  . Gout   . Hypertension   . Hypoalbuminemia 03/31/2017  . Necrotizing fasciitis ([place])   . Right leg DVT ([place]) 03/31/2017  . Sinus tachycardia 03/31/2017  . Type 2 diabetes mellitus with stage 2 chronic kidney disease, with long-term current use of insulin ([place]) 03/27/2017    Family History  Problem Relation Age of Onset  . Hypertension Mother   . Stroke Mother   . Diabetes Father   . Renal Disease Father   . Hypertension Sister   . Diabetes Sister   . Renal Disease Sister   . Iron deficiency Sister     Past Surgical History:  Procedure Laterality Date  . I&D EXTREMITY Right 04/03/2017   Procedure: IRRIGATION AND DEBRIDEMENT RIGHT LEG, APPLY WOUND VAC;  Surgeon: Newt Minion, MD;  Location: [facility];  Service: Orthopedics;  Laterality: Right;  . I&D EXTREMITY Right 04/05/2017   Procedure: IRRIGATION AND DEBRIDEMENT RIGHT LEG;  Surgeon: Newt Minion, MD;  Location: [facility];  Service: Orthopedics;  Laterality: Right;  . SCROTAL SURGERY  2006  . SKIN SPLIT GRAFT Right 04/10/2017   Procedure: SKIN GRAFT SPLIT THICKNESS RIGHT LEG;  Surgeon: Newt Minion, MD;  Location: [facility];  Service: Orthopedics;  Laterality: Right;   Social History   Occupational History  . Not on file  Tobacco Use  . Smoking status: Never Smoker  . Smokeless tobacco: Never Used  Substance and Sexual Activity  . Alcohol use: No  . Drug use: No  . Sexual activity: No

## 2017-07-03 ENCOUNTER — Ambulatory Visit (INDEPENDENT_AMBULATORY_CARE_PROVIDER_SITE_OTHER): Payer: BLUE CROSS/BLUE SHIELD | Admitting: Family

## 2017-07-03 ENCOUNTER — Encounter (INDEPENDENT_AMBULATORY_CARE_PROVIDER_SITE_OTHER): Payer: Self-pay | Admitting: Family

## 2017-07-03 DIAGNOSIS — Z945 Skin transplant status: Secondary | ICD-10-CM

## 2017-07-03 NOTE — Progress Notes (Signed)
Office Visit Note   Patient: [name]           Date of Birth: 06-Jan-1971           MRN: [id number] Visit Date: 07/03/2017              Requested by: Tracie Harrier, MD [address] [facility] [place], [place] [address] PCP: Tracie Harrier, MD  No chief complaint on file.     HPI: Patient is a 46 year old gentleman status post limb salvage intervention for necrotizing fasciitis of the right leg.  Patient has been having serial compression wraps.  Assessment & Plan: Visit Diagnoses:  1. S/P split thickness skin graft     Plan: Discussed with the patient again the importance of elevation.  We will apply a Dynaflex wrap we will change this in the office on Friday, will reapply wrap on 12/26 as well.    Follow-Up Instructions: Return in about 2 days (around 07/05/2017).   Ortho Exam  Patient is alert, oriented, no adenopathy, well-dressed, normal affect, normal respiratory effort. Examination there is good epithelialization around the wound edges about 4 mm circumferentially. Bleeding today, no drainage with 100% healthy beefy granulation tissue with the massive wound lateral aspect of the right leg. No erythema, warmth or sign of infection.  Imaging: No results found. No images are attached to the encounter.  Labs: Lab Results  Component Value Date   HGBA1C >15.5 (H) 03/31/2017   ESRSEDRATE 112 (H) 04/24/2017   ESRSEDRATE 108 (H) 03/30/2017   CRP 56.2 (H) 04/24/2017   CRP 27.2 (H) 03/30/2017   REPTSTATUS 04/07/2017 FINAL 04/05/2017   GRAMSTAIN  04/03/2017    ABUNDANT WBC PRESENT, PREDOMINANTLY PMN FEW GRAM POSITIVE COCCI IN CLUSTERS RARE GRAM NEGATIVE RODS Gram Stain Report Called to,Read Back By and Verified With: A Sheryn Bison RN [id number] 04/03/17 A BROWNING    CULT RARE NORMAL NASOPHARYNGEAL FLORA 04/05/2017   LABORGA STAPHYLOCOCCUS AUREUS 04/03/2017   LABORGA ESCHERICHIA COLI 04/03/2017    @LABSALLVALUES (HGBA1)@  There is no height  or weight on file to calculate BMI.  Orders:  No orders of the defined types were placed in this encounter.  No orders of the defined types were placed in this encounter.    Procedures: No procedures performed  Clinical Data: No additional findings.  ROS:  All other systems negative, except as noted in the HPI. Review of Systems  Constitutional: Negative for chills and fever.  Cardiovascular: Positive for leg swelling.  Skin: Positive for wound. Negative for color change.    Objective: Vital Signs: There were no vitals taken for this visit.  Specialty Comments:  No specialty comments available.  PMFS History: Patient Active Problem List   Diagnosis Date Noted  . S/P split thickness skin graft 06/28/2017  . Escherichia coli sepsis ([place]) 04/18/2017  . Acute blood loss as cause of postoperative anemia 04/18/2017  . ARF (acute renal failure) ([place])   . Diabetic ulcer of ankle ([place])   . Necrotizing fasciitis of lower leg ([place])   . Hyperglycemia due to type 2 diabetes mellitus ([place]) 03/31/2017  . Acute kidney injury superimposed on chronic kidney disease ([place]) 03/31/2017  . Hypoalbuminemia 03/31/2017  . Right leg DVT ([place]) 03/31/2017  . Diabetic ulcer of calf ([place]) 03/31/2017  . Noncompliance w/medication treatment due to intermit use of medication 03/31/2017  . Sinus tachycardia 03/31/2017  . Fever 03/31/2017  . Chronic anticoagulation 03/31/2017  . Sepsis ([place]) 03/30/2017  . Type 2 diabetes  mellitus with stage 2 chronic kidney disease, with long-term current use of insulin ([place]) 03/27/2017  . HTN (hypertension) 03/22/2017  . Diabetes ([place]) 03/22/2017   Past Medical History:  Diagnosis Date  . ARF (acute renal failure) ([place])   . Cellulitis of right lower leg 03/31/2017  . Chronic anticoagulation 03/31/2017  . Diabetes mellitus without complication ([place])   . Diabetic ulcer of calf ([place]) 03/31/2017  . DVT of proximal lower limb ([place]) 03/20/2017   R calf area  . Gout    . Hypertension   . Hypoalbuminemia 03/31/2017  . Necrotizing fasciitis ([place])   . Right leg DVT ([place]) 03/31/2017  . Sinus tachycardia 03/31/2017  . Type 2 diabetes mellitus with stage 2 chronic kidney disease, with long-term current use of insulin ([place]) 03/27/2017    Family History  Problem Relation Age of Onset  . Hypertension Mother   . Stroke Mother   . Diabetes Father   . Renal Disease Father   . Hypertension Sister   . Diabetes Sister   . Renal Disease Sister   . Iron deficiency Sister     Past Surgical History:  Procedure Laterality Date  . I&D EXTREMITY Right 04/03/2017   Procedure: IRRIGATION AND DEBRIDEMENT RIGHT LEG, APPLY WOUND VAC;  Surgeon: Newt Minion, MD;  Location: [facility];  Service: Orthopedics;  Laterality: Right;  . I&D EXTREMITY Right 04/05/2017   Procedure: IRRIGATION AND DEBRIDEMENT RIGHT LEG;  Surgeon: Newt Minion, MD;  Location: [facility];  Service: Orthopedics;  Laterality: Right;  . SCROTAL SURGERY  2006  . SKIN SPLIT GRAFT Right 04/10/2017   Procedure: SKIN GRAFT SPLIT THICKNESS RIGHT LEG;  Surgeon: Newt Minion, MD;  Location: [facility];  Service: Orthopedics;  Laterality: Right;   Social History   Occupational History  . Not on file  Tobacco Use  . Smoking status: Never Smoker  . Smokeless tobacco: Never Used  Substance and Sexual Activity  . Alcohol use: No  . Drug use: No  . Sexual activity: No

## 2017-07-05 ENCOUNTER — Telehealth (INDEPENDENT_AMBULATORY_CARE_PROVIDER_SITE_OTHER): Payer: Self-pay | Admitting: Radiology

## 2017-07-05 ENCOUNTER — Encounter (INDEPENDENT_AMBULATORY_CARE_PROVIDER_SITE_OTHER): Payer: Self-pay | Admitting: Family

## 2017-07-05 ENCOUNTER — Ambulatory Visit (INDEPENDENT_AMBULATORY_CARE_PROVIDER_SITE_OTHER): Payer: BLUE CROSS/BLUE SHIELD | Admitting: Family

## 2017-07-05 DIAGNOSIS — Z945 Skin transplant status: Secondary | ICD-10-CM

## 2017-07-05 NOTE — Progress Notes (Signed)
Office Visit Note   Patient: [name]           Date of Birth: Apr 18, 1971           MRN: [id number] Visit Date: 07/05/2017              Requested by: Tracie Harrier, MD [address] [facility] [place], [place] [address] PCP: Tracie Harrier, MD  No chief complaint on file.     HPI: Patient is a 46 year old gentleman status post limb salvage intervention for necrotizing fasciitis of the right leg.  Patient has been having serial compression wraps. [facility] has done wraps in past with him.   Assessment & Plan: Visit Diagnoses:  No diagnosis found.  Plan: Discussed with the patient again the importance of elevation.  We will apply a silvercell dressing and Dynaflex wrap. we will change this in the office next Wednesday. Will see if [facility] can assist with wraps next Monday and Friday.   Follow-Up Instructions: No Follow-up on file.   Ortho Exam  Patient is alert, oriented, no adenopathy, well-dressed, normal affect, normal respiratory effort. Good wrinkling of skin from wrapping. Examination there is good epithelialization around the wound edges. Bleeding today, no drainage with 100% healthy beefy granulation tissue. No erythema, warmth or sign of infection.  Imaging: No results found. No images are attached to the encounter.  Labs: Lab Results  Component Value Date   HGBA1C >15.5 (H) 03/31/2017   ESRSEDRATE 112 (H) 04/24/2017   ESRSEDRATE 108 (H) 03/30/2017   CRP 56.2 (H) 04/24/2017   CRP 27.2 (H) 03/30/2017   REPTSTATUS 04/07/2017 FINAL 04/05/2017   GRAMSTAIN  04/03/2017    ABUNDANT WBC PRESENT, PREDOMINANTLY PMN FEW GRAM POSITIVE COCCI IN CLUSTERS RARE GRAM NEGATIVE RODS Gram Stain Report Called to,Read Back By and Verified With: A Sheryn Bison RN [phone] 04/03/17 A BROWNING    CULT RARE NORMAL NASOPHARYNGEAL FLORA 04/05/2017   LABORGA STAPHYLOCOCCUS AUREUS 04/03/2017   LABORGA ESCHERICHIA COLI 04/03/2017     @LABSALLVALUES (HGBA1)@  There is no height or weight on file to calculate BMI.  Orders:  No orders of the defined types were placed in this encounter.  No orders of the defined types were placed in this encounter.    Procedures: No procedures performed  Clinical Data: No additional findings.  ROS:  All other systems negative, except as noted in the HPI. Review of Systems  Constitutional: Negative for chills and fever.  Cardiovascular: Positive for leg swelling.  Skin: Positive for wound. Negative for color change.    Objective: Vital Signs: There were no vitals taken for this visit.  Specialty Comments:  No specialty comments available.  PMFS History: Patient Active Problem List   Diagnosis Date Noted  . S/P split thickness skin graft 06/28/2017  . Escherichia coli sepsis ([place]) 04/18/2017  . Acute blood loss as cause of postoperative anemia 04/18/2017  . ARF (acute renal failure) ([place])   . Diabetic ulcer of ankle ([place])   . Necrotizing fasciitis of lower leg ([place])   . Hyperglycemia due to type 2 diabetes mellitus ([place]) 03/31/2017  . Acute kidney injury superimposed on chronic kidney disease ([place]) 03/31/2017  . Hypoalbuminemia 03/31/2017  . Right leg DVT ([place]) 03/31/2017  . Diabetic ulcer of calf ([place]) 03/31/2017  . Noncompliance w/medication treatment due to intermit use of medication 03/31/2017  . Sinus tachycardia 03/31/2017  . Fever 03/31/2017  . Chronic anticoagulation 03/31/2017  . Sepsis ([place]) 03/30/2017  . Type 2 diabetes  mellitus with stage 2 chronic kidney disease, with long-term current use of insulin ([place]) 03/27/2017  . HTN (hypertension) 03/22/2017  . Diabetes ([place]) 03/22/2017   Past Medical History:  Diagnosis Date  . ARF (acute renal failure) ([place])   . Cellulitis of right lower leg 03/31/2017  . Chronic anticoagulation 03/31/2017  . Diabetes mellitus without complication ([place])   . Diabetic ulcer of calf ([place]) 03/31/2017  . DVT of proximal  lower limb ([place]) 03/20/2017   R calf area  . Gout   . Hypertension   . Hypoalbuminemia 03/31/2017  . Necrotizing fasciitis ([place])   . Right leg DVT ([place]) 03/31/2017  . Sinus tachycardia 03/31/2017  . Type 2 diabetes mellitus with stage 2 chronic kidney disease, with long-term current use of insulin ([place]) 03/27/2017    Family History  Problem Relation Age of Onset  . Hypertension Mother   . Stroke Mother   . Diabetes Father   . Renal Disease Father   . Hypertension Sister   . Diabetes Sister   . Renal Disease Sister   . Iron deficiency Sister     Past Surgical History:  Procedure Laterality Date  . I&D EXTREMITY Right 04/03/2017   Procedure: IRRIGATION AND DEBRIDEMENT RIGHT LEG, APPLY WOUND VAC;  Surgeon: Newt Minion, MD;  Location: [facility];  Service: Orthopedics;  Laterality: Right;  . I&D EXTREMITY Right 04/05/2017   Procedure: IRRIGATION AND DEBRIDEMENT RIGHT LEG;  Surgeon: Newt Minion, MD;  Location: [facility];  Service: Orthopedics;  Laterality: Right;  . SCROTAL SURGERY  2006  . SKIN SPLIT GRAFT Right 04/10/2017   Procedure: SKIN GRAFT SPLIT THICKNESS RIGHT LEG;  Surgeon: Newt Minion, MD;  Location: [facility];  Service: Orthopedics;  Laterality: Right;   Social History   Occupational History  . Not on file  Tobacco Use  . Smoking status: Never Smoker  . Smokeless tobacco: Never Used  Substance and Sexual Activity  . Alcohol use: No  . Drug use: No  . Sexual activity: No

## 2017-07-05 NOTE — Telephone Encounter (Signed)
Called [facility] and they do have two scheduled appointments next week updated wound care they will be seeing him 24th and 27th.

## 2017-07-10 ENCOUNTER — Telehealth (INDEPENDENT_AMBULATORY_CARE_PROVIDER_SITE_OTHER): Payer: Self-pay | Admitting: Orthopedic Surgery

## 2017-07-10 ENCOUNTER — Ambulatory Visit (INDEPENDENT_AMBULATORY_CARE_PROVIDER_SITE_OTHER): Payer: BLUE CROSS/BLUE SHIELD | Admitting: Radiology

## 2017-07-10 DIAGNOSIS — Z945 Skin transplant status: Secondary | ICD-10-CM

## 2017-07-10 NOTE — Progress Notes (Signed)
Patient was seen in the office today for nurse only appointment for wound care dressing. He is status post skin grafting right lateral leg secondary to necrotizing fasciitis. Due to heavy amount of drainage, patient has been coming twice a week to our office. He was last wrapped by our office on Friday, and [facility] nursing did remove this and reapply on Monday. [facility] will follow up with patient this Friday for rewrap of his leg and I advised patient to schedule an appointment with Dr Sharol Given next week. He is wearing multilayer dynaflex compression wrapping. There is red healthy granulation tissue present with bloody drainage. There is no odor, no redness, no signs of infection or cellulitis. Elevation and compliance encouraged, we will see him next week for Dr. Sharol Given to reevaluate his wound care.

## 2017-07-10 NOTE — Telephone Encounter (Signed)
I tried to sched pt for his f/u didn't have availability on Dr.Duda or Erin sched. Pt is sched for 07/17/17 will this appt be ok for pt?Pt stated he is still having some drainage and I wanted to make sure this day was ok.  Pt also mentioned having a home care nurse come to his house.

## 2017-07-10 NOTE — Telephone Encounter (Signed)
This is fine, this was discussed with the patient as well. [facility] will be changing on Friday. We will see if they will see him once more on Monday in interim before his appointment.

## 2017-07-10 NOTE — Telephone Encounter (Signed)
Faxed to [facility] at [facility] to [phone] and [phone] and [phone].

## 2017-07-17 ENCOUNTER — Encounter (INDEPENDENT_AMBULATORY_CARE_PROVIDER_SITE_OTHER): Payer: Self-pay | Admitting: Family

## 2017-07-17 ENCOUNTER — Ambulatory Visit (INDEPENDENT_AMBULATORY_CARE_PROVIDER_SITE_OTHER): Payer: BLUE CROSS/BLUE SHIELD | Admitting: Family

## 2017-07-17 DIAGNOSIS — Z945 Skin transplant status: Secondary | ICD-10-CM

## 2017-07-17 NOTE — Progress Notes (Signed)
Office Visit Note   Patient: [name]           Date of Birth: 22-Nov-1970           MRN: [id number] Visit Date: 07/17/2017              Requested by: Tracie Harrier, MD [address] [facility] [place], [place] [address] PCP: Tracie Harrier, MD  Chief Complaint  Patient presents with  . Right Leg - Pain      HPI: Patient is a 47 year old gentleman status post limb salvage intervention for necrotizing fasciitis of the right leg.  Patient has been having serial compression wraps. [facility] has done wraps for him over last week or so.   Assessment & Plan: Visit Diagnoses:  No diagnosis found.  Plan: Discussed with the patient again the importance of elevation.  We will apply a silvercell dressing and Dynaflex wrap. we will change this in the office in 2 weeks. Will see if [facility] can assist with wraps for next 2 weeks.  Follow-Up Instructions: No Follow-up on file.   Ortho Exam  Patient is alert, oriented, no adenopathy, well-dressed, normal affect, normal respiratory effort. Good wrinkling of skin from wrapping. Examination there is good epithelialization around the wound edges. Bleeding today, no odor or purulence with 100% healthy beefy granulation tissue. No erythema, warmth or sign of infection. Wound 16.5 x 10.5 cm with no depth.  Imaging: No results found. No images are attached to the encounter.  Labs: Lab Results  Component Value Date   HGBA1C >15.5 (H) 03/31/2017   ESRSEDRATE 112 (H) 04/24/2017   ESRSEDRATE 108 (H) 03/30/2017   CRP 56.2 (H) 04/24/2017   CRP 27.2 (H) 03/30/2017   REPTSTATUS 04/07/2017 FINAL 04/05/2017   GRAMSTAIN  04/03/2017    ABUNDANT WBC PRESENT, PREDOMINANTLY PMN FEW GRAM POSITIVE COCCI IN CLUSTERS RARE GRAM NEGATIVE RODS Gram Stain Report Called to,Read Back By and Verified With: A Sheryn Bison RN [phone] 04/03/17 A BROWNING    CULT RARE NORMAL NASOPHARYNGEAL FLORA 04/05/2017   LABORGA STAPHYLOCOCCUS AUREUS  04/03/2017   LABORGA ESCHERICHIA COLI 04/03/2017    @LABSALLVALUES (HGBA1)@  There is no height or weight on file to calculate BMI.  Orders:  No orders of the defined types were placed in this encounter.  No orders of the defined types were placed in this encounter.    Procedures: No procedures performed  Clinical Data: No additional findings.  ROS:  All other systems negative, except as noted in the HPI. Review of Systems  Constitutional: Negative for chills and fever.  Cardiovascular: Positive for leg swelling.  Skin: Positive for wound. Negative for color change.    Objective: Vital Signs: There were no vitals taken for this visit.  Specialty Comments:  No specialty comments available.  PMFS History: Patient Active Problem List   Diagnosis Date Noted  . S/P split thickness skin graft 06/28/2017  . Escherichia coli sepsis ([place]) 04/18/2017  . Acute blood loss as cause of postoperative anemia 04/18/2017  . ARF (acute renal failure) ([place])   . Diabetic ulcer of ankle ([place])   . Necrotizing fasciitis of lower leg ([place])   . Hyperglycemia due to type 2 diabetes mellitus ([place]) 03/31/2017  . Acute kidney injury superimposed on chronic kidney disease ([place]) 03/31/2017  . Hypoalbuminemia 03/31/2017  . Right leg DVT ([place]) 03/31/2017  . Diabetic ulcer of calf ([place]) 03/31/2017  . Noncompliance w/medication treatment due to intermit use of medication 03/31/2017  . Sinus  tachycardia 03/31/2017  . Fever 03/31/2017  . Chronic anticoagulation 03/31/2017  . Sepsis ([place]) 03/30/2017  . Type 2 diabetes mellitus with stage 2 chronic kidney disease, with long-term current use of insulin ([place]) 03/27/2017  . HTN (hypertension) 03/22/2017  . Diabetes ([place]) 03/22/2017   Past Medical History:  Diagnosis Date  . ARF (acute renal failure) ([place])   . Cellulitis of right lower leg 03/31/2017  . Chronic anticoagulation 03/31/2017  . Diabetes mellitus without complication ([place])   . Diabetic  ulcer of calf ([place]) 03/31/2017  . DVT of proximal lower limb ([place]) 03/20/2017   R calf area  . Gout   . Hypertension   . Hypoalbuminemia 03/31/2017  . Necrotizing fasciitis ([place])   . Right leg DVT ([place]) 03/31/2017  . Sinus tachycardia 03/31/2017  . Type 2 diabetes mellitus with stage 2 chronic kidney disease, with long-term current use of insulin ([place]) 03/27/2017    Family History  Problem Relation Age of Onset  . Hypertension Mother   . Stroke Mother   . Diabetes Father   . Renal Disease Father   . Hypertension Sister   . Diabetes Sister   . Renal Disease Sister   . Iron deficiency Sister     Past Surgical History:  Procedure Laterality Date  . I&D EXTREMITY Right 04/03/2017   Procedure: IRRIGATION AND DEBRIDEMENT RIGHT LEG, APPLY WOUND VAC;  Surgeon: Newt Minion, MD;  Location: [facility];  Service: Orthopedics;  Laterality: Right;  . I&D EXTREMITY Right 04/05/2017   Procedure: IRRIGATION AND DEBRIDEMENT RIGHT LEG;  Surgeon: Newt Minion, MD;  Location: [facility];  Service: Orthopedics;  Laterality: Right;  . SCROTAL SURGERY  2006  . SKIN SPLIT GRAFT Right 04/10/2017   Procedure: SKIN GRAFT SPLIT THICKNESS RIGHT LEG;  Surgeon: Newt Minion, MD;  Location: [facility];  Service: Orthopedics;  Laterality: Right;   Social History   Occupational History  . Not on file  Tobacco Use  . Smoking status: Never Smoker  . Smokeless tobacco: Never Used  Substance and Sexual Activity  . Alcohol use: No  . Drug use: No  . Sexual activity: No

## 2017-07-18 ENCOUNTER — Other Ambulatory Visit (INDEPENDENT_AMBULATORY_CARE_PROVIDER_SITE_OTHER): Payer: Self-pay

## 2017-07-19 ENCOUNTER — Telehealth (INDEPENDENT_AMBULATORY_CARE_PROVIDER_SITE_OTHER): Payer: Self-pay | Admitting: Radiology

## 2017-07-19 NOTE — Telephone Encounter (Signed)
We are in a predicament with this patient. He is going back to work the week after next. Home health will have to discharge him since he will no longer be home bound at that time. Discharging next Friday. He is no longer going to have insurance due to being out of work so long. For them to just order supplies would be 70 dollars for silver alginate and 250 per wrap. He is getting this changed three times a week. That's 960 just for dressing supplies a week. Even if he comes in the office its not even an affordable billing cost. What else can we do, patient has persistent swelling and heavy amount of drainage. What else can we offer this patient.  Status post skin grafting Skin grafting 04/10/17.

## 2017-07-22 NOTE — Telephone Encounter (Signed)
Done. Appt made for 01/09 @ 3:15

## 2017-07-22 NOTE — Telephone Encounter (Signed)
Can you please call pt and make appt with Dr. Sharol Given for this week???

## 2017-07-22 NOTE — Telephone Encounter (Signed)
Call patient and let us evaluated in the office to see if we can use a medical compression stocking instead of the compression wraps with dressing changes.

## 2017-07-24 ENCOUNTER — Ambulatory Visit (INDEPENDENT_AMBULATORY_CARE_PROVIDER_SITE_OTHER): Payer: Self-pay | Admitting: Orthopedic Surgery

## 2017-07-24 ENCOUNTER — Encounter (INDEPENDENT_AMBULATORY_CARE_PROVIDER_SITE_OTHER): Payer: Self-pay | Admitting: Orthopedic Surgery

## 2017-07-24 VITALS — Ht 71.0 in | Wt 230.0 lb

## 2017-07-24 DIAGNOSIS — Z945 Skin transplant status: Secondary | ICD-10-CM

## 2017-07-24 NOTE — Progress Notes (Signed)
Office Visit Note   Patient: [name]           Date of Birth: December 16, 1970           MRN: [id number] Visit Date: 07/24/2017              Requested by: Tracie Harrier, MD [address] [facility] [place], [place] [address] PCP: Tracie Harrier, MD  Chief Complaint  Patient presents with  . Right Leg - Follow-up    04/10/17 RLE STSG necrotizing fasciitis        HPI: Patient presents in follow-up for split-thickness skin graft installation wound VAC therapy for necrotizing fasciitis right leg.  Patient states he is going to be returning to work next week he states he has lost his insurance and he cannot afford the compression wraps.  Assessment & Plan: Visit Diagnoses:  1. S/P split thickness skin graft     Plan: We will have him go to [facility] to obtain a pair of the medical compression stockings 2 layer.  He will wear 2 layers during the day 1 while at night and wash his leg with soap and water daily.  Patient states he is on his feet for at least 8 hours a day and discussed the importance of not standing and elevating his leg.  We will follow-up closely next week.  Follow-Up Instructions: Return in about 1 week (around 07/31/2017).   Ortho Exam  Patient is alert, oriented, no adenopathy, well-dressed, normal affect, normal respiratory effort. Examination patient has some clear drainage there is superficial epithelialization around the wound edges there is good granulation tissue with no odor no cellulitis no signs of infection he does have venous stasis swelling.  Imaging: No results found. No images are attached to the encounter.  Labs: Lab Results  Component Value Date   HGBA1C >15.5 (H) 03/31/2017   ESRSEDRATE 112 (H) 04/24/2017   ESRSEDRATE 108 (H) 03/30/2017   CRP 56.2 (H) 04/24/2017   CRP 27.2 (H) 03/30/2017   REPTSTATUS 04/07/2017 FINAL 04/05/2017   GRAMSTAIN  04/03/2017    ABUNDANT WBC PRESENT, PREDOMINANTLY  PMN FEW GRAM POSITIVE COCCI IN CLUSTERS RARE GRAM NEGATIVE RODS Gram Stain Report Called to,Read Back By and Verified With: A Sheryn Bison RN [phone] 04/03/17 A BROWNING    CULT RARE NORMAL NASOPHARYNGEAL FLORA 04/05/2017   LABORGA STAPHYLOCOCCUS AUREUS 04/03/2017   LABORGA ESCHERICHIA COLI 04/03/2017    @LABSALLVALUES (HGBA1)@  Body mass index is 32.08 kg/m.  Orders:  No orders of the defined types were placed in this encounter.  No orders of the defined types were placed in this encounter.    Procedures: No procedures performed  Clinical Data: No additional findings.  ROS:  All other systems negative, except as noted in the HPI. Review of Systems  Objective: Vital Signs: Ht 5\' 11"  (1.803 m)   Wt 230 lb (104.3 kg)   BMI 32.08 kg/m   Specialty Comments:  No specialty comments available.  PMFS History: Patient Active Problem List   Diagnosis Date Noted  . S/P split thickness skin graft 06/28/2017  . Escherichia coli sepsis ([place]) 04/18/2017  . Acute blood loss as cause of postoperative anemia 04/18/2017  . ARF (acute renal failure) ([place])   . Diabetic ulcer of ankle ([place])   . Necrotizing fasciitis of lower leg ([place])   . Hyperglycemia due to type 2 diabetes mellitus ([place]) 03/31/2017  . Acute kidney injury superimposed on chronic kidney disease ([place]) 03/31/2017  .  Hypoalbuminemia 03/31/2017  . Right leg DVT ([place]) 03/31/2017  . Diabetic ulcer of calf ([place]) 03/31/2017  . Noncompliance w/medication treatment due to intermit use of medication 03/31/2017  . Sinus tachycardia 03/31/2017  . Fever 03/31/2017  . Chronic anticoagulation 03/31/2017  . Sepsis ([place]) 03/30/2017  . Type 2 diabetes mellitus with stage 2 chronic kidney disease, with long-term current use of insulin ([place]) 03/27/2017  . HTN (hypertension) 03/22/2017  . Diabetes ([place]) 03/22/2017   Past Medical History:  Diagnosis Date  . ARF (acute renal failure) ([place])   . Cellulitis of right lower leg 03/31/2017  .  Chronic anticoagulation 03/31/2017  . Diabetes mellitus without complication ([place])   . Diabetic ulcer of calf ([place]) 03/31/2017  . DVT of proximal lower limb ([place]) 03/20/2017   R calf area  . Gout   . Hypertension   . Hypoalbuminemia 03/31/2017  . Necrotizing fasciitis ([place])   . Right leg DVT ([place]) 03/31/2017  . Sinus tachycardia 03/31/2017  . Type 2 diabetes mellitus with stage 2 chronic kidney disease, with long-term current use of insulin ([place]) 03/27/2017    Family History  Problem Relation Age of Onset  . Hypertension Mother   . Stroke Mother   . Diabetes Father   . Renal Disease Father   . Hypertension Sister   . Diabetes Sister   . Renal Disease Sister   . Iron deficiency Sister     Past Surgical History:  Procedure Laterality Date  . I&D EXTREMITY Right 04/03/2017   Procedure: IRRIGATION AND DEBRIDEMENT RIGHT LEG, APPLY WOUND VAC;  Surgeon: Newt Minion, MD;  Location: [facility];  Service: Orthopedics;  Laterality: Right;  . I&D EXTREMITY Right 04/05/2017   Procedure: IRRIGATION AND DEBRIDEMENT RIGHT LEG;  Surgeon: Newt Minion, MD;  Location: [facility];  Service: Orthopedics;  Laterality: Right;  . SCROTAL SURGERY  2006  . SKIN SPLIT GRAFT Right 04/10/2017   Procedure: SKIN GRAFT SPLIT THICKNESS RIGHT LEG;  Surgeon: Newt Minion, MD;  Location: [facility];  Service: Orthopedics;  Laterality: Right;   Social History   Occupational History  . Not on file  Tobacco Use  . Smoking status: Never Smoker  . Smokeless tobacco: Never Used  Substance and Sexual Activity  . Alcohol use: No  . Drug use: No  . Sexual activity: No

## 2017-07-31 ENCOUNTER — Ambulatory Visit (INDEPENDENT_AMBULATORY_CARE_PROVIDER_SITE_OTHER): Payer: BLUE CROSS/BLUE SHIELD | Admitting: Family

## 2017-08-08 ENCOUNTER — Encounter (INDEPENDENT_AMBULATORY_CARE_PROVIDER_SITE_OTHER): Payer: Self-pay | Admitting: Orthopedic Surgery

## 2017-08-08 ENCOUNTER — Ambulatory Visit (INDEPENDENT_AMBULATORY_CARE_PROVIDER_SITE_OTHER): Payer: BLUE CROSS/BLUE SHIELD | Admitting: Orthopedic Surgery

## 2017-08-08 VITALS — Ht 71.0 in | Wt 230.0 lb

## 2017-08-08 DIAGNOSIS — Z945 Skin transplant status: Secondary | ICD-10-CM

## 2017-08-08 DIAGNOSIS — M726 Necrotizing fasciitis: Secondary | ICD-10-CM

## 2017-08-08 NOTE — Progress Notes (Signed)
Office Visit Note   Patient: [name]           Date of Birth: 1970-10-08           MRN: [id number] Visit Date: 08/08/2017              Requested by: Tracie Harrier, MD [address] [facility] [place], [place] [address] PCP: Tracie Harrier, MD  Chief Complaint  Patient presents with  . Right Leg - Follow-up    04/10/17 STSG necrotizing fasciitis       HPI: Patient presents in follow-up for split-thickness skin graft with installation wound VAC therapy for necrotizing fasciitis right leg.  He has returned to work. Is using the 2 layer medical compression stockings. States on days he is on his feet notices more drainage, is draining through the sock.  Assessment & Plan: Visit Diagnoses:  1. S/P split thickness skin graft   2. Necrotizing fasciitis of lower leg (HCC)     Plan:  Continue current wound care; wear 2 layers during the day 1 while at night and wash his leg with soap and water daily.  Patient states he is on his feet for at least 8 hours a day and discussed the importance of not standing and elevating his leg.  We will follow-up in 2 weeks.  Follow-Up Instructions: Return in about 2 weeks (around 08/22/2017).   Ortho Exam  Patient is alert, oriented, no adenopathy, well-dressed, normal affect, normal respiratory effort. Examination patient has some bleeding, no drainage. there is superficial epithelialization around the wound edges there is good granulation tissue with no odor no cellulitis no signs of infection he does have venous stasis swelling. Ulceration now 6.5 x 14 cm in size. No depth.   Imaging: No results found. No images are attached to the encounter.  Labs: Lab Results  Component Value Date   HGBA1C >15.5 (H) 03/31/2017   ESRSEDRATE 112 (H) 04/24/2017   ESRSEDRATE 108 (H) 03/30/2017   CRP 56.2 (H) 04/24/2017   CRP 27.2 (H) 03/30/2017   REPTSTATUS 04/07/2017 FINAL 04/05/2017   GRAMSTAIN  04/03/2017    ABUNDANT WBC  PRESENT, PREDOMINANTLY PMN FEW GRAM POSITIVE COCCI IN CLUSTERS RARE GRAM NEGATIVE RODS Gram Stain Report Called to,Read Back By and Verified With: A Sheryn Bison RN [phone] 04/03/17 A BROWNING    CULT RARE NORMAL NASOPHARYNGEAL FLORA 04/05/2017   LABORGA STAPHYLOCOCCUS AUREUS 04/03/2017   LABORGA ESCHERICHIA COLI 04/03/2017    @LABSALLVALUES (HGBA1)@  Body mass index is 32.08 kg/m.  Orders:  No orders of the defined types were placed in this encounter.  No orders of the defined types were placed in this encounter.    Procedures: No procedures performed  Clinical Data: No additional findings.  ROS:  All other systems negative, except as noted in the HPI. Review of Systems  Constitutional: Negative for chills and fever.  Cardiovascular: Positive for leg swelling.  Skin: Positive for wound. Negative for color change.    Objective: Vital Signs: Ht 5\' 11"  (1.803 m)   Wt 230 lb (104.3 kg)   BMI 32.08 kg/m   Specialty Comments:  No specialty comments available.  PMFS History: Patient Active Problem List   Diagnosis Date Noted  . S/P split thickness skin graft 06/28/2017  . Escherichia coli sepsis ([place]) 04/18/2017  . Acute blood loss as cause of postoperative anemia 04/18/2017  . ARF (acute renal failure) ([place])   . Diabetic ulcer of ankle ([place])   . Necrotizing fasciitis of  lower leg ([place])   . Hyperglycemia due to type 2 diabetes mellitus ([place]) 03/31/2017  . Acute kidney injury superimposed on chronic kidney disease ([place]) 03/31/2017  . Hypoalbuminemia 03/31/2017  . Right leg DVT ([place]) 03/31/2017  . Diabetic ulcer of calf ([place]) 03/31/2017  . Noncompliance w/medication treatment due to intermit use of medication 03/31/2017  . Sinus tachycardia 03/31/2017  . Fever 03/31/2017  . Chronic anticoagulation 03/31/2017  . Sepsis ([place]) 03/30/2017  . Type 2 diabetes mellitus with stage 2 chronic kidney disease, with long-term current use of insulin ([place]) 03/27/2017  . HTN  (hypertension) 03/22/2017  . Diabetes ([place]) 03/22/2017   Past Medical History:  Diagnosis Date  . ARF (acute renal failure) ([place])   . Cellulitis of right lower leg 03/31/2017  . Chronic anticoagulation 03/31/2017  . Diabetes mellitus without complication ([place])   . Diabetic ulcer of calf ([place]) 03/31/2017  . DVT of proximal lower limb ([place]) 03/20/2017   R calf area  . Gout   . Hypertension   . Hypoalbuminemia 03/31/2017  . Necrotizing fasciitis ([place])   . Right leg DVT ([place]) 03/31/2017  . Sinus tachycardia 03/31/2017  . Type 2 diabetes mellitus with stage 2 chronic kidney disease, with long-term current use of insulin ([place]) 03/27/2017    Family History  Problem Relation Age of Onset  . Hypertension Mother   . Stroke Mother   . Diabetes Father   . Renal Disease Father   . Hypertension Sister   . Diabetes Sister   . Renal Disease Sister   . Iron deficiency Sister     Past Surgical History:  Procedure Laterality Date  . I&D EXTREMITY Right 04/03/2017   Procedure: IRRIGATION AND DEBRIDEMENT RIGHT LEG, APPLY WOUND VAC;  Surgeon: Newt Minion, MD;  Location: [facility];  Service: Orthopedics;  Laterality: Right;  . I&D EXTREMITY Right 04/05/2017   Procedure: IRRIGATION AND DEBRIDEMENT RIGHT LEG;  Surgeon: Newt Minion, MD;  Location: [facility];  Service: Orthopedics;  Laterality: Right;  . SCROTAL SURGERY  2006  . SKIN SPLIT GRAFT Right 04/10/2017   Procedure: SKIN GRAFT SPLIT THICKNESS RIGHT LEG;  Surgeon: Newt Minion, MD;  Location: [facility];  Service: Orthopedics;  Laterality: Right;   Social History   Occupational History  . Not on file  Tobacco Use  . Smoking status: Never Smoker  . Smokeless tobacco: Never Used  Substance and Sexual Activity  . Alcohol use: No  . Drug use: No  . Sexual activity: No

## 2017-08-26 ENCOUNTER — Ambulatory Visit (INDEPENDENT_AMBULATORY_CARE_PROVIDER_SITE_OTHER): Payer: BLUE CROSS/BLUE SHIELD | Admitting: Orthopedic Surgery

## 2017-08-26 ENCOUNTER — Encounter (INDEPENDENT_AMBULATORY_CARE_PROVIDER_SITE_OTHER): Payer: Self-pay | Admitting: Orthopedic Surgery

## 2017-08-26 DIAGNOSIS — Z945 Skin transplant status: Secondary | ICD-10-CM

## 2017-08-26 NOTE — Progress Notes (Addendum)
Office Visit Note   Patient: [name]           Date of Birth: 1970/10/09           MRN: [id number] Visit Date: 08/26/2017              Requested by: Tracie Harrier, MD [address] [facility] [place], [place] [address] PCP: Tracie Harrier, MD  Chief Complaint  Patient presents with  . Right Leg - Routine Post Op      HPI: Patient presents in follow-up for split-thickness skin graft with installation wound VAC therapy for necrotizing fasciitis right leg.  He has returned to work. Is using the 2 layer medical compression stockings. States on days he is on his feet notices more swelling. Has not been having much drainage. Pleased with progress.   Assessment & Plan: Visit Diagnoses:  1. S/P split thickness skin graft     Plan:  Continue current wound care; wear 2 layers during the day 1 while at night and wash his leg with soap and water daily.  Patient states he is on his feet for at least 8 hours a day and discussed the importance of not standing and elevating his leg.  We will follow-up in 2 more weeks.  Follow-Up Instructions: No Follow-up on file.   Ortho Exam  Patient is alert, oriented, no adenopathy, well-dressed, normal affect, normal respiratory effort. Examination patient has some bleeding, no drainage. there is superficial epithelialization around the wound edges there is good granulation tissue with no odor no cellulitis no signs of infection he does have venous stasis swelling. Ulceration now 5 x 12.5 cm in size. Has had nearly 1 cm circumferentially of epithelialization since last visit. 100% granulation. No depth.   Imaging: No results found. No images are attached to the encounter.  Labs: Lab Results  Component Value Date   HGBA1C >15.5 (H) 03/31/2017   ESRSEDRATE 112 (H) 04/24/2017   ESRSEDRATE 108 (H) 03/30/2017   CRP 56.2 (H) 04/24/2017   CRP 27.2 (H) 03/30/2017   REPTSTATUS 04/07/2017 FINAL 04/05/2017   GRAMSTAIN   04/03/2017    ABUNDANT WBC PRESENT, PREDOMINANTLY PMN FEW GRAM POSITIVE COCCI IN CLUSTERS RARE GRAM NEGATIVE RODS Gram Stain Report Called to,Read Back By and Verified With: A Sheryn Bison RN [phone] 04/03/17 A BROWNING    CULT RARE NORMAL NASOPHARYNGEAL FLORA 04/05/2017   LABORGA STAPHYLOCOCCUS AUREUS 04/03/2017   LABORGA ESCHERICHIA COLI 04/03/2017    @LABSALLVALUES (HGBA1)@  There is no height or weight on file to calculate BMI.  Orders:  No orders of the defined types were placed in this encounter.  No orders of the defined types were placed in this encounter.    Procedures: No procedures performed  Clinical Data: No additional findings.  ROS:  All other systems negative, except as noted in the HPI. Review of Systems  Constitutional: Negative for chills and fever.  Cardiovascular: Positive for leg swelling.  Skin: Positive for wound. Negative for color change.    Objective: Vital Signs: There were no vitals taken for this visit.  Specialty Comments:  No specialty comments available.  PMFS History: Patient Active Problem List   Diagnosis Date Noted  . S/P split thickness skin graft 06/28/2017  . Escherichia coli sepsis ([place]) 04/18/2017  . Acute blood loss as cause of postoperative anemia 04/18/2017  . ARF (acute renal failure) ([place])   . Diabetic ulcer of ankle ([place])   . Necrotizing fasciitis of lower leg ([place])   .  Hyperglycemia due to type 2 diabetes mellitus ([place]) 03/31/2017  . Acute kidney injury superimposed on chronic kidney disease ([place]) 03/31/2017  . Hypoalbuminemia 03/31/2017  . Right leg DVT ([place]) 03/31/2017  . Diabetic ulcer of calf ([place]) 03/31/2017  . Noncompliance w/medication treatment due to intermit use of medication 03/31/2017  . Sinus tachycardia 03/31/2017  . Fever 03/31/2017  . Chronic anticoagulation 03/31/2017  . Sepsis ([place]) 03/30/2017  . Type 2 diabetes mellitus with stage 2 chronic kidney disease, with long-term current use of insulin ([place])  03/27/2017  . HTN (hypertension) 03/22/2017  . Diabetes ([place]) 03/22/2017   Past Medical History:  Diagnosis Date  . ARF (acute renal failure) ([place])   . Cellulitis of right lower leg 03/31/2017  . Chronic anticoagulation 03/31/2017  . Diabetes mellitus without complication ([place])   . Diabetic ulcer of calf ([place]) 03/31/2017  . DVT of proximal lower limb ([place]) 03/20/2017   R calf area  . Gout   . Hypertension   . Hypoalbuminemia 03/31/2017  . Necrotizing fasciitis ([place])   . Right leg DVT ([place]) 03/31/2017  . Sinus tachycardia 03/31/2017  . Type 2 diabetes mellitus with stage 2 chronic kidney disease, with long-term current use of insulin ([place]) 03/27/2017    Family History  Problem Relation Age of Onset  . Hypertension Mother   . Stroke Mother   . Diabetes Father   . Renal Disease Father   . Hypertension Sister   . Diabetes Sister   . Renal Disease Sister   . Iron deficiency Sister     Past Surgical History:  Procedure Laterality Date  . I&D EXTREMITY Right 04/03/2017   Procedure: IRRIGATION AND DEBRIDEMENT RIGHT LEG, APPLY WOUND VAC;  Surgeon: Newt Minion, MD;  Location: [facility];  Service: Orthopedics;  Laterality: Right;  . I&D EXTREMITY Right 04/05/2017   Procedure: IRRIGATION AND DEBRIDEMENT RIGHT LEG;  Surgeon: Newt Minion, MD;  Location: [facility];  Service: Orthopedics;  Laterality: Right;  . SCROTAL SURGERY  2006  . SKIN SPLIT GRAFT Right 04/10/2017   Procedure: SKIN GRAFT SPLIT THICKNESS RIGHT LEG;  Surgeon: Newt Minion, MD;  Location: [facility];  Service: Orthopedics;  Laterality: Right;   Social History   Occupational History  . Not on file  Tobacco Use  . Smoking status: Never Smoker  . Smokeless tobacco: Never Used  Substance and Sexual Activity  . Alcohol use: No  . Drug use: No  . Sexual activity: No

## 2017-09-09 ENCOUNTER — Ambulatory Visit (INDEPENDENT_AMBULATORY_CARE_PROVIDER_SITE_OTHER): Payer: BLUE CROSS/BLUE SHIELD | Admitting: Orthopedic Surgery

## 2017-09-20 ENCOUNTER — Inpatient Hospital Stay: Payer: BLUE CROSS/BLUE SHIELD | Attending: Oncology | Admitting: Oncology

## 2017-09-20 DIAGNOSIS — I10 Essential (primary) hypertension: Secondary | ICD-10-CM | POA: Insufficient documentation

## 2017-09-20 DIAGNOSIS — Z7901 Long term (current) use of anticoagulants: Secondary | ICD-10-CM | POA: Insufficient documentation

## 2017-09-20 DIAGNOSIS — E119 Type 2 diabetes mellitus without complications: Secondary | ICD-10-CM | POA: Insufficient documentation

## 2017-09-20 DIAGNOSIS — Z794 Long term (current) use of insulin: Secondary | ICD-10-CM | POA: Insufficient documentation

## 2017-09-20 DIAGNOSIS — I82531 Chronic embolism and thrombosis of right popliteal vein: Secondary | ICD-10-CM | POA: Insufficient documentation

## 2017-09-27 ENCOUNTER — Encounter: Payer: Self-pay | Admitting: Oncology

## 2017-09-27 ENCOUNTER — Inpatient Hospital Stay (HOSPITAL_BASED_OUTPATIENT_CLINIC_OR_DEPARTMENT_OTHER): Payer: BLUE CROSS/BLUE SHIELD | Admitting: Oncology

## 2017-09-27 VITALS — BP 176/115 | HR 97 | Temp 97.2°F | Resp 18 | Wt 248.6 lb

## 2017-09-27 DIAGNOSIS — Z794 Long term (current) use of insulin: Secondary | ICD-10-CM | POA: Diagnosis not present

## 2017-09-27 DIAGNOSIS — I10 Essential (primary) hypertension: Secondary | ICD-10-CM | POA: Diagnosis not present

## 2017-09-27 DIAGNOSIS — I82531 Chronic embolism and thrombosis of right popliteal vein: Secondary | ICD-10-CM

## 2017-09-27 DIAGNOSIS — Z7901 Long term (current) use of anticoagulants: Secondary | ICD-10-CM | POA: Diagnosis not present

## 2017-09-27 DIAGNOSIS — E119 Type 2 diabetes mellitus without complications: Secondary | ICD-10-CM | POA: Diagnosis not present

## 2017-09-27 NOTE — Progress Notes (Signed)
Hematology/Oncology Consult note [facility]  [phone] [phone]  Patient Care Team: Tracie Harrier, MD as PCP - General (Internal Medicine)   Name of the patient: [name]  [id number]  [birth year]   Date of visit: 09/27/17  Diagnosis- right popliteal vein DVT unprovoked  Chief complaint/ Reason for visit- routine f/u of DVT  Heme/Onc history: . Patient is a 47 year old African-American male who moved to [place] from [place] last year. His past medical history is significant for hypertension and diabetes among other medical problems patient states that he was on medications for these conditions while he was in [place] and he hasn't taken his medications over the last 1 year. He presented with right lower extremity pain and swelling and underwent Doppler on 03/20/2017 which showed deep venous thromboses in the right popliteal vein. He had driven 9-7/4 hours to [place] on 03/17/2017 and back on 03/19/2017. Denies any prior history of DVT or PE. He has not been on blood thinners in the past. Denies any family history of blood clots. Other than the 5-1/2 hour drive patient states that he was otherwise active in the preceding days. Denies any trauma. Right lower extremity pain and swelling has improved over the last few days. He was discharged from the ER on Xarelto and is currently taking the same. He is yet to see his PCP and is scheduled to see Dr.Hande on 03/27/2017. Stopped smoking in 1992. Does not drink alcohol. Denies any unintentional wight loss.     Interval history- Patient is currently tolerating xarelto well. Denies any bleeding issues. He did recently have staph skin infection of RLE and is currently undergoing wound care for the same. Blood sugars are still uncontrolled  ECOG PS- 1 Pain scale- 0   Review of systems- Review of Systems  Constitutional: Negative for chills, fever, malaise/fatigue and weight loss.    HENT: Negative for congestion, ear discharge and nosebleeds.   Eyes: Negative for blurred vision.  Respiratory: Negative for cough, hemoptysis, sputum production, shortness of breath and wheezing.   Cardiovascular: Negative for chest pain, palpitations, orthopnea and claudication.  Gastrointestinal: Negative for abdominal pain, blood in stool, constipation, diarrhea, heartburn, melena, nausea and vomiting.  Genitourinary: Negative for dysuria, flank pain, frequency, hematuria and urgency.  Musculoskeletal: Negative for back pain, joint pain and myalgias.       RLE wound  Skin: Negative for rash.  Neurological: Negative for dizziness, tingling, focal weakness, seizures, weakness and headaches.  Endo/Heme/Allergies: Does not bruise/bleed easily.  Psychiatric/Behavioral: Negative for depression and suicidal ideas. The patient does not have insomnia.       No Known Allergies   Past Medical History:  Diagnosis Date  . ARF (acute renal failure) ([place])   . Cellulitis of right lower leg 03/31/2017  . Chronic anticoagulation 03/31/2017  . Diabetes mellitus without complication ([place])   . Diabetic ulcer of calf ([place]) 03/31/2017  . DVT of proximal lower limb ([place]) 03/20/2017   R calf area  . Gout   . Hypertension   . Hypoalbuminemia 03/31/2017  . Necrotizing fasciitis ([place])   . Right leg DVT ([place]) 03/31/2017  . Sinus tachycardia 03/31/2017  . Type 2 diabetes mellitus with stage 2 chronic kidney disease, with long-term current use of insulin ([place]) 03/27/2017     Past Surgical History:  Procedure Laterality Date  . I&D EXTREMITY Right 04/03/2017   Procedure: IRRIGATION AND DEBRIDEMENT RIGHT LEG, APPLY WOUND VAC;  Surgeon: Newt Minion, MD;  Location: [facility];  Service: Orthopedics;  Laterality: Right;  . I&D EXTREMITY Right 04/05/2017   Procedure: IRRIGATION AND DEBRIDEMENT RIGHT LEG;  Surgeon: Newt Minion, MD;  Location: [facility];  Service: Orthopedics;  Laterality: Right;  . SCROTAL SURGERY   2006  . SKIN SPLIT GRAFT Right 04/10/2017   Procedure: SKIN GRAFT SPLIT THICKNESS RIGHT LEG;  Surgeon: Newt Minion, MD;  Location: [facility];  Service: Orthopedics;  Laterality: Right;    Social History   Socioeconomic History  . Marital status: Single    Spouse name: Not on file  . Number of children: Not on file  . Years of education: Not on file  . Highest education level: Not on file  Social Needs  . Financial resource strain: Not on file  . Food insecurity - worry: Not on file  . Food insecurity - inability: Not on file  . Transportation needs - medical: Not on file  . Transportation needs - non-medical: Not on file  Occupational History  . Not on file  Tobacco Use  . Smoking status: Never Smoker  . Smokeless tobacco: Never Used  Substance and Sexual Activity  . Alcohol use: No  . Drug use: No  . Sexual activity: No  Other Topics Concern  . Not on file  Social History Narrative  . Not on file    Family History  Problem Relation Age of Onset  . Hypertension Mother   . Stroke Mother   . Diabetes Father   . Renal Disease Father   . Hypertension Sister   . Diabetes Sister   . Renal Disease Sister   . Iron deficiency Sister      Current Outpatient Medications:  .  Amino Acids-Protein Hydrolys (FEEDING SUPPLEMENT, PRO-STAT SUGAR FREE 64,) LIQD, Take 30 mLs by mouth 3 (three) times daily between meals., Disp: 900 mL, Rfl: 0 .  amLODipine (NORVASC) 10 MG tablet, Take 1 tablet (10 mg total) by mouth daily., Disp: , Rfl:  .  docusate sodium (COLACE) 100 MG capsule, Take 1 capsule (100 mg total) by mouth 2 (two) times daily., Disp: 10 capsule, Rfl: 0 .  hydrALAZINE (APRESOLINE) 50 MG tablet, Take 50 mg by mouth 3 (three) times daily., Disp: , Rfl:  .  HYDROcodone-acetaminophen (NORCO/VICODIN) 5-325 MG tablet, Take 1 tablet by mouth. Take one tablet every 8 hours. Stop 05/14/17, Disp: , Rfl:  .  insulin aspart (NOVOLOG FLEXPEN) 100 UNIT/ML FlexPen, Inject into the skin.  Sliding scale 121-150 2 units; 151-200 3 units; 201-250 5 units; 251-300 8 units; 301-350 11 units; 351-400 15 units, grater than 400 - 15 units, call doctor., Disp: , Rfl:  .  insulin glargine (LANTUS) 100 UNIT/ML injection, Inject 0.3 mLs (30 Units total) into the skin daily., Disp: 10 mL, Rfl: 11 .  metoprolol tartrate (LOPRESSOR) 100 MG tablet, Take 1 tablet (100 mg total) by mouth 2 (two) times daily., Disp: , Rfl:  .  rivaroxaban (XARELTO) 20 MG TABS tablet, Take 20 mg by mouth daily with supper., Disp: , Rfl:  .  silver sulfADIAZINE (SILVADENE) 1 % cream, APPLY EXTERNALLY TO THE AFFECTED AREA DAILY, Disp: 50 g, Rfl: 0 .  cyclobenzaprine (FLEXERIL) 5 MG tablet, Take 1 tablet (5 mg total) by mouth 3 (three) times daily as needed for muscle spasms. (Patient not taking: Reported on 09/27/2017), Disp: 30 tablet, Rfl: 0 .  polyethylene glycol (MIRALAX / GLYCOLAX) packet, Take 17 g by mouth daily as needed. (Patient not taking: Reported on  09/27/2017), Disp: 14 each, Rfl: 0  Physical exam:  Vitals:   09/27/17 1020 09/27/17 1044  BP: (!) 179/107 (!) 176/115  Pulse: 97   Resp: 18   Temp: (!) 97.2 F (36.2 C)   TempSrc: Tympanic   Weight: 248 lb 9 oz (112.7 kg)    Physical Exam  Constitutional: He is oriented to person, place, and time and well-developed, well-nourished, and in no distress.  HENT:  Head: Normocephalic and atraumatic.  Eyes: EOM are normal. Pupils are equal, round, and reactive to light.  Neck: Normal range of motion.  Cardiovascular: Normal rate, regular rhythm and normal heart sounds.  Pulmonary/Chest: Effort normal and breath sounds normal.  Abdominal: Soft. Bowel sounds are normal.  Musculoskeletal:  Dressing in place over RLE  Neurological: He is alert and oriented to person, place, and time.  Skin: Skin is warm and dry.     CMP Latest Ref Rng & Units 04/24/2017  Glucose 65 - 99 mg/dL 176(H)  BUN 7 - 25 mg/dL 17  Creatinine 0.60 - 1.35 mg/dL 1.29  Sodium 135 -  146 mmol/L 135  Potassium 3.5 - 5.3 mmol/L 4.7  Chloride 98 - 110 mmol/L 100  CO2 20 - 32 mmol/L 26  Calcium 8.6 - 10.3 mg/dL 8.7  Total Protein 6.5 - 8.1 g/dL -  Total Bilirubin 0.3 - 1.2 mg/dL -  Alkaline Phos 38 - 126 U/L -  AST 15 - 41 U/L -  ALT 17 - 63 U/L -   CBC Latest Ref Rng & Units 04/24/2017  WBC 3.8 - 10.8 Thousand/uL 5.8  Hemoglobin 13.2 - 17.1 g/dL 9.8(L)  Hematocrit 38.5 - 50.0 % 29.4(L)  Platelets 140 - 400 Thousand/uL 263     Assessment and plan- Patient is a 47 y.o. male with chronic RLE popliteal vein DVT unprovoked  Patient has completed 6 months of anticoagulation. 1st episode of unprovoked proximal LE DVT, male sex, obesity, uncontrolled diabetes continue to remain risk factors for recurrence. Discussed risk of recuurent DVT versus risk of bleeding with anticoagulation. Patient understands and would like to continue xarelto at this time. I would recommend indefinite anticoagulation as long as patient is tolerating it without bleeding and his renal functions are stable. He can continue to f/u with Dr. Ginette Pitman and can be referred to [place] in the future if questions or concerns arise  Systolic and diastolic BP elevated. Patient states he forgot to take his morning BP meds. Denies any chest pain, headaches or other symptoms. We will inform Dr. Ginette Pitman about this and patient has been instructed to take his meds this mornign after the visit. He verbalized understanding  Recent cbc done outside showed hb improved to 12.4 and creatinine was normal at 1.2. To be followed by pcp   Visit Diagnosis 1. Chronic deep vein thrombosis (DVT) of popliteal vein of right lower extremity (HCC)      Dr. Randa Evens, MD, MPH [place] [facility] Pager- [phone] 09/27/2017 11:33 AM

## 2017-09-27 NOTE — Progress Notes (Signed)
Pt in today for follow up.  Reports missed appt last week due to having to work.,  Pt has resumed working 3rd shift.  States did not have financial means to get meds for about 6 weeks. Insurance has started back and he started his medications back last Friday.  Pt BP is elevated this morning, he states he has not taken meds yet and has them in the car and will take after appt.

## 2017-09-30 ENCOUNTER — Other Ambulatory Visit: Payer: Self-pay | Admitting: *Deleted

## 2017-12-17 DIAGNOSIS — I10 Essential (primary) hypertension: Secondary | ICD-10-CM | POA: Insufficient documentation

## 2017-12-17 DIAGNOSIS — L97912 Non-pressure chronic ulcer of unspecified part of right lower leg with fat layer exposed: Secondary | ICD-10-CM | POA: Insufficient documentation

## 2018-01-23 ENCOUNTER — Emergency Department
Admission: EM | Admit: 2018-01-23 | Discharge: 2018-01-23 | Disposition: A | Discharge status: Home / Self Care | Payer: BLUE CROSS/BLUE SHIELD | Attending: Emergency Medicine | Admitting: Emergency Medicine

## 2018-01-23 ENCOUNTER — Encounter: Payer: Self-pay | Admitting: Emergency Medicine

## 2018-01-23 ENCOUNTER — Other Ambulatory Visit: Payer: Self-pay

## 2018-01-23 ENCOUNTER — Emergency Department: Payer: BLUE CROSS/BLUE SHIELD

## 2018-01-23 DIAGNOSIS — Z7901 Long term (current) use of anticoagulants: Secondary | ICD-10-CM | POA: Diagnosis not present

## 2018-01-23 DIAGNOSIS — Z794 Long term (current) use of insulin: Secondary | ICD-10-CM | POA: Diagnosis not present

## 2018-01-23 DIAGNOSIS — I129 Hypertensive chronic kidney disease with stage 1 through stage 4 chronic kidney disease, or unspecified chronic kidney disease: Secondary | ICD-10-CM | POA: Insufficient documentation

## 2018-01-23 DIAGNOSIS — N182 Chronic kidney disease, stage 2 (mild): Secondary | ICD-10-CM | POA: Diagnosis not present

## 2018-01-23 DIAGNOSIS — Z79899 Other long term (current) drug therapy: Secondary | ICD-10-CM | POA: Insufficient documentation

## 2018-01-23 DIAGNOSIS — R1010 Upper abdominal pain, unspecified: Secondary | ICD-10-CM

## 2018-01-23 DIAGNOSIS — R1011 Right upper quadrant pain: Secondary | ICD-10-CM | POA: Insufficient documentation

## 2018-01-23 DIAGNOSIS — E1122 Type 2 diabetes mellitus with diabetic chronic kidney disease: Secondary | ICD-10-CM | POA: Insufficient documentation

## 2018-01-23 LAB — URINALYSIS, COMPLETE (UACMP) WITH MICROSCOPIC
Bacteria, UA: NONE SEEN
Bilirubin Urine: NEGATIVE
Glucose, UA: NEGATIVE mg/dL
Hgb urine dipstick: NEGATIVE
Ketones, ur: NEGATIVE mg/dL
Leukocytes, UA: NEGATIVE
Nitrite: NEGATIVE
Protein, ur: 100 mg/dL — AB
Specific Gravity, Urine: 1.017 (ref 1.005–1.030)
Squamous Epithelial / LPF: NONE SEEN (ref 0–5)
pH: 6 (ref 5.0–8.0)

## 2018-01-23 LAB — COMPREHENSIVE METABOLIC PANEL
ALT: 16 U/L (ref 0–44)
AST: 18 U/L (ref 15–41)
Albumin: 3.9 g/dL (ref 3.5–5.0)
Alkaline Phosphatase: 75 U/L (ref 38–126)
Anion gap: 7 (ref 5–15)
BUN: 21 mg/dL — ABNORMAL HIGH (ref 6–20)
CO2: 25 mmol/L (ref 22–32)
Calcium: 9 mg/dL (ref 8.9–10.3)
Chloride: 109 mmol/L (ref 98–111)
Creatinine, Ser: 1.44 mg/dL — ABNORMAL HIGH (ref 0.61–1.24)
GFR calc Af Amer: >60 mL/min (ref >60)
GFR calc non Af Amer: 57 mL/min — ABNORMAL LOW (ref >60)
Glucose, Bld: 140 mg/dL — ABNORMAL HIGH (ref 70–99)
Potassium: 4.1 mmol/L (ref 3.5–5.1)
Sodium: 141 mmol/L (ref 135–145)
Total Bilirubin: 0.7 mg/dL (ref 0.3–1.2)
Total Protein: 7.5 g/dL (ref 6.5–8.1)

## 2018-01-23 LAB — TROPONIN I: Troponin I: <0.03 ng/mL (ref <0.03)

## 2018-01-23 LAB — CBC
HCT: 45.1 % (ref 40.0–52.0)
Hemoglobin: 15.3 g/dL (ref 13.0–18.0)
MCH: 29.1 pg (ref 26.0–34.0)
MCHC: 33.9 g/dL (ref 32.0–36.0)
MCV: 86 fL (ref 80.0–100.0)
Platelets: 252 10*3/uL (ref 150–440)
RBC: 5.24 MIL/uL (ref 4.40–5.90)
RDW: 14.4 % (ref 11.5–14.5)
WBC: 4 10*3/uL (ref 3.8–10.6)

## 2018-01-23 LAB — LIPASE, BLOOD: Lipase: 31 U/L (ref 11–51)

## 2018-01-23 MED ORDER — KETOROLAC TROMETHAMINE 30 MG/ML IJ SOLN
30.0000 mg | Freq: Once | INTRAMUSCULAR | Status: AC
  Administered 2018-01-23: 30 mg via INTRAVENOUS
  Filled 2018-01-23: qty 1

## 2018-01-23 MED ORDER — IOPAMIDOL (ISOVUE-300) INJECTION 61%
125.0000 mL | Freq: Once | INTRAVENOUS | Status: AC | PRN
  Administered 2018-01-23: 125 mL via INTRAVENOUS

## 2018-01-23 NOTE — ED Notes (Signed)
MD at bedside talking to patient.

## 2018-01-23 NOTE — ED Triage Notes (Signed)
Patient ambulatory to triage with steady gait, without difficulty or distress noted; pt reports mid upper abd pain last few days accomp by [facility]; st hx of same 2011 with no findings

## 2018-01-23 NOTE — ED Provider Notes (Signed)
[facility] Emergency Department Provider Note ___   First MD Initiated Contact with Patient 01/23/18 0144     (approximate)  I have reviewed the triage vital signs and the nursing notes.   HISTORY  Chief Complaint Abdominal Pain    HPI MARLYN RABINE is a 47 y.o. male with below list of chronic medical conditions presents to the emergency department with current 6 out of 10 upper abdominal pain intermittently times the past few days.  Patient states he had a similar episode in 2011 without any diagnosis given.  Patient denies any nausea no vomiting.  Patient denies any fever.  Patient denies any urinary symptoms no constipation or diarrhea.   Past Medical History:  Diagnosis Date  . ARF (acute renal failure) ([place])   . Cellulitis of right lower leg 03/31/2017  . Chronic anticoagulation 03/31/2017  . Diabetes mellitus without complication ([place])   . Diabetic ulcer of calf ([place]) 03/31/2017  . DVT of proximal lower limb ([place]) 03/20/2017   R calf area  . Gout   . Hypertension   . Hypoalbuminemia 03/31/2017  . Necrotizing fasciitis ([place])   . Right leg DVT ([place]) 03/31/2017  . Sinus tachycardia 03/31/2017  . Type 2 diabetes mellitus with stage 2 chronic kidney disease, with long-term current use of insulin ([place]) 03/27/2017    Patient Active Problem List   Diagnosis Date Noted  . S/P split thickness skin graft 06/28/2017  . Escherichia coli sepsis ([place]) 04/18/2017  . Acute blood loss as cause of postoperative anemia 04/18/2017  . ARF (acute renal failure) ([place])   . Diabetic ulcer of ankle ([place])   . Necrotizing fasciitis of lower leg ([place])   . Hyperglycemia due to type 2 diabetes mellitus ([place]) 03/31/2017  . Acute kidney injury superimposed on chronic kidney disease ([place]) 03/31/2017  . Hypoalbuminemia 03/31/2017  . Right leg DVT ([place]) 03/31/2017  . Diabetic ulcer of calf ([place]) 03/31/2017  . Noncompliance w/medication treatment due to intermit use of  medication 03/31/2017  . Sinus tachycardia 03/31/2017  . Fever 03/31/2017  . Chronic anticoagulation 03/31/2017  . Sepsis ([place]) 03/30/2017  . Type 2 diabetes mellitus with stage 2 chronic kidney disease, with long-term current use of insulin ([place]) 03/27/2017  . HTN (hypertension) 03/22/2017  . Diabetes ([place]) 03/22/2017    Past Surgical History:  Procedure Laterality Date  . I&D EXTREMITY Right 04/03/2017   Procedure: IRRIGATION AND DEBRIDEMENT RIGHT LEG, APPLY WOUND VAC;  Surgeon: Newt Minion, MD;  Location: [facility];  Service: Orthopedics;  Laterality: Right;  . I&D EXTREMITY Right 04/05/2017   Procedure: IRRIGATION AND DEBRIDEMENT RIGHT LEG;  Surgeon: Newt Minion, MD;  Location: [facility];  Service: Orthopedics;  Laterality: Right;  . SCROTAL SURGERY  2006  . SKIN SPLIT GRAFT Right 04/10/2017   Procedure: SKIN GRAFT SPLIT THICKNESS RIGHT LEG;  Surgeon: Newt Minion, MD;  Location: [facility];  Service: Orthopedics;  Laterality: Right;    Prior to Admission medications   Medication Sig Start Date End Date Taking? Authorizing Provider  ferrous sulfate 325 (65 FE) MG EC tablet Take 325 mg by mouth 3 (three) times daily with meals.   Yes [provider]  lisinopril (PRINIVIL,ZESTRIL) 10 MG tablet Take 10 mg by mouth daily.   Yes [provider]  Amino Acids-Protein Hydrolys (FEEDING SUPPLEMENT, PRO-STAT SUGAR FREE 64,) LIQD Take 30 mLs by mouth 3 (three) times daily between meals. 04/12/17   Hosie Poisson, MD  amLODipine ([place])  10 MG tablet Take 1 tablet (10 mg total) by mouth daily. 04/13/17   Hosie Poisson, MD  cyclobenzaprine (FLEXERIL) 5 MG tablet Take 1 tablet (5 mg total) by mouth 3 (three) times daily as needed for muscle spasms. Patient not taking: Reported on 09/27/2017 04/12/17   Hosie Poisson, MD  dicyclomine (BENTYL) 20 MG tablet Take 1 tablet (20 mg total) by mouth every 6 (six) hours as needed. 01/24/18   Paulette Blanch, MD  docusate sodium (COLACE) 100 MG capsule  Take 1 capsule (100 mg total) by mouth 2 (two) times daily. 04/12/17   Hosie Poisson, MD  hydrALAZINE (APRESOLINE) 50 MG tablet Take 50 mg by mouth 3 (three) times daily.    [provider]  HYDROcodone-acetaminophen (NORCO/VICODIN) 5-325 MG tablet Take 1 tablet by mouth. Take one tablet every 8 hours. Stop 05/14/17    [provider]  insulin aspart (NOVOLOG FLEXPEN) 100 UNIT/ML FlexPen Inject into the skin. Sliding scale 121-150 2 units; 151-200 3 units; 201-250 5 units; 251-300 8 units; 301-350 11 units; 351-400 15 units, grater than 400 - 15 units, call doctor.    [provider]  insulin glargine (LANTUS) 100 UNIT/ML injection Inject 0.3 mLs (30 Units total) into the skin daily. 04/13/17   Hosie Poisson, MD  metoprolol tartrate (LOPRESSOR) 100 MG tablet Take 1 tablet (100 mg total) by mouth 2 (two) times daily. 04/12/17   Hosie Poisson, MD  pantoprazole (PROTONIX) 40 MG tablet Take 1 tablet (40 mg total) by mouth daily. 01/24/18 02/23/18  Paulette Blanch, MD  polyethylene glycol [facility] / Floria Raveling) packet Take 17 g by mouth daily as needed. Patient not taking: Reported on 09/27/2017 04/12/17   Hosie Poisson, MD  rivaroxaban (XARELTO) 20 MG TABS tablet Take 20 mg by mouth daily with supper.    [provider]  silver sulfADIAZINE (SILVADENE) 1 % cream APPLY EXTERNALLY TO THE AFFECTED AREA DAILY 06/03/17   Suzan Slick, NP  sucralfate (CARAFATE) 1 GM/10ML suspension Take 10 mLs (1 g total) by mouth 4 (four) times daily. 01/24/18   Paulette Blanch, MD    Allergies No known drug allergies  Family History  Problem Relation Age of Onset  . Hypertension Mother   . Stroke Mother   . Diabetes Father   . Renal Disease Father   . Hypertension Sister   . Diabetes Sister   . Renal Disease Sister   . Iron deficiency Sister     Social History Social History   Tobacco Use  . Smoking status: Never Smoker  . Smokeless tobacco: Never Used  Substance Use Topics  .  Alcohol use: No  . Drug use: No    Review of Systems Constitutional: No fever/chills Eyes: No visual changes. ENT: No sore throat. Cardiovascular: Denies chest pain. Respiratory: Denies shortness of breath. Gastrointestinal: Positive for abdominal pain.  No nausea, no vomiting.  No diarrhea.  No constipation. Genitourinary: Negative for dysuria. Musculoskeletal: Negative for neck pain.  Negative for back pain. Integumentary: Negative for rash. Neurological: Negative for headaches, focal weakness or numbness.   ____________________________________________   PHYSICAL EXAM:  VITAL SIGNS: ED Triage Vitals [01/23/18 0010]  Enc Vitals Group     BP (!) 149/96     Pulse Rate 72     Resp 18     Temp 97.9 F (36.6 C)     Temp Source Oral     SpO2 98 %     Weight 117.5 kg (259 lb)  Height 1.803 m (5\' 11" )     Head Circumference      Peak Flow      Pain Score 9     Pain Loc      Pain Edu?      Excl. in [place]?     Constitutional: Alert and oriented. Well appearing and in no acute distress. Eyes: Conjunctivae are normal.  Head: Atraumatic. Mouth/Throat: Mucous membranes are moist.  Oropharynx non-erythematous. Neck: No stridor.   Cardiovascular: Normal rate, regular rhythm. Good peripheral circulation. Grossly normal heart sounds. Respiratory: Normal respiratory effort.  No retractions. Lungs CTAB. Gastrointestinal: Right upper quadrant tenderness to palpation.. No distention.  Musculoskeletal: No lower extremity tenderness nor edema. No gross deformities of extremities. Neurologic:  Normal speech and language. No gross focal neurologic deficits are appreciated.  Skin:  Skin is warm, dry and intact. No rash noted. Psychiatric: Mood and affect are normal. Speech and behavior are normal. ____________________________________________   LABS (all labs ordered are listed, but only abnormal results are displayed)  Labs Reviewed  COMPREHENSIVE METABOLIC PANEL - Abnormal; Notable  for the following components:      Result Value   Glucose, Bld 140 (*)    BUN 21 (*)    Creatinine, Ser 1.44 (*)    GFR calc non Af Amer 57 (*)    All other components within normal limits  URINALYSIS, COMPLETE (UACMP) WITH MICROSCOPIC - Abnormal; Notable for the following components:   Color, Urine YELLOW (*)    APPearance CLEAR (*)    Protein, ur 100 (*)    All other components within normal limits  LIPASE, BLOOD  CBC  TROPONIN I   ____________________________________________  EKG  ED ECG REPORT I, [place] N Vernard Gram, the attending physician, personally viewed and interpreted this ECG.   Date: 01/23/2018  EKG Time: 12:15 AM  Rate: 72  Rhythm: Normal sinus rhythm  Axis: Normal  Intervals: Normal  ST&T Change: None  ____________________________________________  RADIOLOGY I, [place] N Jalecia Leon, personally viewed and evaluated these images (plain radiographs) as part of my medical decision making, as well as reviewing the written report by the radiologist.  ED MD interpretation: Right upper quadrant ultrasound revealed no evidence of cholelithiasis.  CT scan of the abdomen revealed left inguinal hernia however nothing that would explain the patient's pain.   Procedures   ____________________________________________   INITIAL IMPRESSION / ASSESSMENT AND PLAN / ED COURSE  As part of my medical decision making, I reviewed the following data within the electronic MEDICAL RECORD NUMBER   47 year old male presenting with above-stated history and physical exam secondary to upper abdominal discomfort.  Consider the possibility of gallbladder disease including cholelithiasis cholecystitis etc. and as such ultrasound of the right upper quadrant was performed which revealed no acute pathology.  CT scan of the abdomen and pelvis likewise did not reveal an explanation for the patient's discomfort.  Incidental finding of a left inguinal hernia was reported by the radiologist.  Patient was  notified of his left inguinal hernia by me.  Patient will be referred to gastroenterology for further outpatient evaluation has no clear etiology for the patient's upper abdominal pain was identified. ____________________________________________  FINAL CLINICAL IMPRESSION(S) / ED DIAGNOSES  Final diagnoses:  RUQ pain  Pain of upper abdomen     MEDICATIONS GIVEN DURING THIS VISIT:  Medications  ketorolac (TORADOL) 30 MG/ML injection 30 mg (30 mg Intravenous Given 01/23/18 0202)  iopamidol (ISOVUE-300) 61 % injection 125 mL (125 mLs Intravenous Contrast Given  01/23/18 0608)     ED Discharge Orders    None       Note:  This document was prepared using Dragon voice recognition software and may include unintentional dictation errors.    Gregor Hams, MD 01/24/18 2256

## 2018-01-24 ENCOUNTER — Emergency Department
Admission: EM | Admit: 2018-01-24 | Discharge: 2018-01-24 | Disposition: A | Discharge status: Home / Self Care | Payer: BLUE CROSS/BLUE SHIELD | Attending: Emergency Medicine | Admitting: Emergency Medicine

## 2018-01-24 ENCOUNTER — Other Ambulatory Visit: Payer: Self-pay

## 2018-01-24 DIAGNOSIS — K219 Gastro-esophageal reflux disease without esophagitis: Secondary | ICD-10-CM | POA: Diagnosis not present

## 2018-01-24 DIAGNOSIS — Z7901 Long term (current) use of anticoagulants: Secondary | ICD-10-CM | POA: Diagnosis not present

## 2018-01-24 DIAGNOSIS — R1013 Epigastric pain: Secondary | ICD-10-CM | POA: Diagnosis present

## 2018-01-24 DIAGNOSIS — E119 Type 2 diabetes mellitus without complications: Secondary | ICD-10-CM | POA: Insufficient documentation

## 2018-01-24 DIAGNOSIS — Z794 Long term (current) use of insulin: Secondary | ICD-10-CM | POA: Insufficient documentation

## 2018-01-24 DIAGNOSIS — Z79899 Other long term (current) drug therapy: Secondary | ICD-10-CM | POA: Insufficient documentation

## 2018-01-24 DIAGNOSIS — K4090 Unilateral inguinal hernia, without obstruction or gangrene, not specified as recurrent: Secondary | ICD-10-CM | POA: Insufficient documentation

## 2018-01-24 DIAGNOSIS — I10 Essential (primary) hypertension: Secondary | ICD-10-CM | POA: Insufficient documentation

## 2018-01-24 LAB — COMPREHENSIVE METABOLIC PANEL
ALT: 14 U/L (ref 0–44)
AST: 19 U/L (ref 15–41)
Albumin: 3.9 g/dL (ref 3.5–5.0)
Alkaline Phosphatase: 70 U/L (ref 38–126)
Anion gap: 7 (ref 5–15)
BUN: 21 mg/dL — ABNORMAL HIGH (ref 6–20)
CO2: 26 mmol/L (ref 22–32)
Calcium: 9.2 mg/dL (ref 8.9–10.3)
Chloride: 106 mmol/L (ref 98–111)
Creatinine, Ser: 1.44 mg/dL — ABNORMAL HIGH (ref 0.61–1.24)
GFR calc Af Amer: >60 mL/min (ref >60)
GFR calc non Af Amer: 57 mL/min — ABNORMAL LOW (ref >60)
Glucose, Bld: 203 mg/dL — ABNORMAL HIGH (ref 70–99)
Potassium: 4.6 mmol/L (ref 3.5–5.1)
Sodium: 139 mmol/L (ref 135–145)
Total Bilirubin: 0.6 mg/dL (ref 0.3–1.2)
Total Protein: 7.5 g/dL (ref 6.5–8.1)

## 2018-01-24 LAB — CBC WITH DIFFERENTIAL/PLATELET
Basophils Absolute: 0 10*3/uL (ref 0–0.1)
Basophils Relative: 0 %
Eosinophils Absolute: 0.1 10*3/uL (ref 0–0.7)
Eosinophils Relative: 2 %
HCT: 44.3 % (ref 40.0–52.0)
Hemoglobin: 14.9 g/dL (ref 13.0–18.0)
Lymphocytes Relative: 22 %
Lymphs Abs: 1.1 10*3/uL (ref 1.0–3.6)
MCH: 28.9 pg (ref 26.0–34.0)
MCHC: 33.7 g/dL (ref 32.0–36.0)
MCV: 85.7 fL (ref 80.0–100.0)
Monocytes Absolute: 0.3 10*3/uL (ref 0.2–1.0)
Monocytes Relative: 6 %
Neutro Abs: 3.4 10*3/uL (ref 1.4–6.5)
Neutrophils Relative %: 70 %
Platelets: 242 10*3/uL (ref 150–440)
RBC: 5.17 MIL/uL (ref 4.40–5.90)
RDW: 14.3 % (ref 11.5–14.5)
WBC: 4.9 10*3/uL (ref 3.8–10.6)

## 2018-01-24 LAB — LIPASE, BLOOD: Lipase: 33 U/L (ref 11–51)

## 2018-01-24 LAB — ETHANOL: Alcohol, Ethyl (B): <10 mg/dL (ref <10)

## 2018-01-24 MED ORDER — DICYCLOMINE HCL 20 MG PO TABS
20.0000 mg | ORAL_TABLET | Freq: Once | ORAL | Status: AC
  Administered 2018-01-24: 20 mg via ORAL
  Filled 2018-01-24: qty 1

## 2018-01-24 MED ORDER — FAMOTIDINE IN NACL 20-0.9 MG/50ML-% IV SOLN
20.0000 mg | Freq: Once | INTRAVENOUS | Status: AC
  Administered 2018-01-24: 20 mg via INTRAVENOUS
  Filled 2018-01-24: qty 50

## 2018-01-24 MED ORDER — PANTOPRAZOLE SODIUM 40 MG PO TBEC: 40.0000 mg | DELAYED_RELEASE_TABLET | Freq: Every day | ORAL | 0 refills | Status: DC

## 2018-01-24 MED ORDER — SUCRALFATE 1 GM/10ML PO SUSP: 1.0000 g | Freq: Four times a day (QID) | ORAL | 1 refills | Status: DC

## 2018-01-24 MED ORDER — DICYCLOMINE HCL 20 MG PO TABS: 20.0000 mg | ORAL_TABLET | Freq: Four times a day (QID) | ORAL | 0 refills | Status: DC | PRN

## 2018-01-24 NOTE — Discharge Instructions (Addendum)
1.  Start these medications daily: Protonix 40 mg daily Carafate 10 mL 4 times daily 2.  You may take Bentyl as needed for abdominal pain. 3.  Avoid heavy, greasy, spicy foods and alcohol. 4.  Return to the ER for worsening symptoms, persistent vomiting, difficulty breathing or other concerns.

## 2018-01-24 NOTE — ED Triage Notes (Signed)
Pt arrives to ED via POV from work with c/o abdominal pain r/t hernia. Pt reports being seen for same recently, told the "stomach was covering the hole and might be involving the intestines". Pt denies any c/o N/V/D. Pt is A&O, in NAD; ambulatory with steady gait to ED Rm 17 for Triage.

## 2018-01-24 NOTE — ED Provider Notes (Signed)
[facility] Emergency Department Provider Note   ____________________________________________   First MD Initiated Contact with Patient 01/24/18 0111     (approximate)  I have reviewed the triage vital signs and the nursing notes.   HISTORY  Chief Complaint Abdominal Pain    HPI [name] is a 47 y.o. male who returns to the ED from work with a chief complaint of epigastric pain.  Patient was seen in the ED yesterday for same; had a negative ultrasound, CT scan demonstrating left inguinal hernia.  He was referred to GI for follow-up; no prescriptions.  Patient states he has had similar pain since 2011.  Has never had an endoscopy or GI evaluation.  Reports pain which seems associated to movement.  For example, patient states his abdomen felt fine until he got ready for work tonight.  It began to hurt when he bent over to put on his socks.  As he was moving around work the pain intensified.  Pain is not associated with eating.  Describes sharp, nonradiating pain.  Denies associated fever, chills, chest pain, shortness of breath, nausea, vomiting, constipation or diarrhea.  Denies recent travel or trauma.   Past Medical History:  Diagnosis Date  . ARF (acute renal failure) ([place])   . Cellulitis of right lower leg 03/31/2017  . Chronic anticoagulation 03/31/2017  . Diabetes mellitus without complication ([place])   . Diabetic ulcer of calf ([place]) 03/31/2017  . DVT of proximal lower limb ([place]) 03/20/2017   R calf area  . Gout   . Hypertension   . Hypoalbuminemia 03/31/2017  . Necrotizing fasciitis ([place])   . Right leg DVT ([place]) 03/31/2017  . Sinus tachycardia 03/31/2017  . Type 2 diabetes mellitus with stage 2 chronic kidney disease, with long-term current use of insulin ([place]) 03/27/2017    Patient Active Problem List   Diagnosis Date Noted  . S/P split thickness skin graft 06/28/2017  . Escherichia coli sepsis ([place]) 04/18/2017  . Acute blood loss as cause  of postoperative anemia 04/18/2017  . ARF (acute renal failure) ([place])   . Diabetic ulcer of ankle ([place])   . Necrotizing fasciitis of lower leg ([place])   . Hyperglycemia due to type 2 diabetes mellitus ([place]) 03/31/2017  . Acute kidney injury superimposed on chronic kidney disease ([place]) 03/31/2017  . Hypoalbuminemia 03/31/2017  . Right leg DVT ([place]) 03/31/2017  . Diabetic ulcer of calf ([place]) 03/31/2017  . Noncompliance w/medication treatment due to intermit use of medication 03/31/2017  . Sinus tachycardia 03/31/2017  . Fever 03/31/2017  . Chronic anticoagulation 03/31/2017  . Sepsis ([place]) 03/30/2017  . Type 2 diabetes mellitus with stage 2 chronic kidney disease, with long-term current use of insulin ([place]) 03/27/2017  . HTN (hypertension) 03/22/2017  . Diabetes ([place]) 03/22/2017    Past Surgical History:  Procedure Laterality Date  . I&D EXTREMITY Right 04/03/2017   Procedure: IRRIGATION AND DEBRIDEMENT RIGHT LEG, APPLY WOUND VAC;  Surgeon: Newt Minion, MD;  Location: [facility];  Service: Orthopedics;  Laterality: Right;  . I&D EXTREMITY Right 04/05/2017   Procedure: IRRIGATION AND DEBRIDEMENT RIGHT LEG;  Surgeon: Newt Minion, MD;  Location: [facility];  Service: Orthopedics;  Laterality: Right;  . SCROTAL SURGERY  2006  . SKIN SPLIT GRAFT Right 04/10/2017   Procedure: SKIN GRAFT SPLIT THICKNESS RIGHT LEG;  Surgeon: Newt Minion, MD;  Location: [facility];  Service: Orthopedics;  Laterality: Right;    Prior to Admission medications   Medication  Sig Start Date End Date Taking? Authorizing Provider  Amino Acids-Protein Hydrolys (FEEDING SUPPLEMENT, PRO-STAT SUGAR FREE 64,) LIQD Take 30 mLs by mouth 3 (three) times daily between meals. 04/12/17   Hosie Poisson, MD  amLODipine (NORVASC) 10 MG tablet Take 1 tablet (10 mg total) by mouth daily. 04/13/17   Hosie Poisson, MD  cyclobenzaprine (FLEXERIL) 5 MG tablet Take 1 tablet (5 mg total) by mouth 3 (three) times daily as needed for muscle  spasms. Patient not taking: Reported on 09/27/2017 04/12/17   Hosie Poisson, MD  dicyclomine (BENTYL) 20 MG tablet Take 1 tablet (20 mg total) by mouth every 6 (six) hours as needed. 01/24/18   Paulette Blanch, MD  docusate sodium (COLACE) 100 MG capsule Take 1 capsule (100 mg total) by mouth 2 (two) times daily. 04/12/17   Hosie Poisson, MD  ferrous sulfate 325 (65 FE) MG EC tablet Take 325 mg by mouth 3 (three) times daily with meals.    [provider]  hydrALAZINE (APRESOLINE) 50 MG tablet Take 50 mg by mouth 3 (three) times daily.    [provider]  HYDROcodone-acetaminophen (NORCO/VICODIN) 5-325 MG tablet Take 1 tablet by mouth. Take one tablet every 8 hours. Stop 05/14/17    [provider]  insulin aspart (NOVOLOG FLEXPEN) 100 UNIT/ML FlexPen Inject into the skin. Sliding scale 121-150 2 units; 151-200 3 units; 201-250 5 units; 251-300 8 units; 301-350 11 units; 351-400 15 units, grater than 400 - 15 units, call doctor.    [provider]  insulin glargine (LANTUS) 100 UNIT/ML injection Inject 0.3 mLs (30 Units total) into the skin daily. 04/13/17   Hosie Poisson, MD  lisinopril (PRINIVIL,ZESTRIL) 10 MG tablet Take 10 mg by mouth daily.    [provider]  metoprolol tartrate (LOPRESSOR) 100 MG tablet Take 1 tablet (100 mg total) by mouth 2 (two) times daily. 04/12/17   Hosie Poisson, MD  pantoprazole (PROTONIX) 40 MG tablet Take 1 tablet (40 mg total) by mouth daily. 01/24/18 02/23/18  Paulette Blanch, MD  polyethylene glycol [facility] / Floria Raveling) packet Take 17 g by mouth daily as needed. Patient not taking: Reported on 09/27/2017 04/12/17   Hosie Poisson, MD  rivaroxaban (XARELTO) 20 MG TABS tablet Take 20 mg by mouth daily with supper.    [provider]  silver sulfADIAZINE (SILVADENE) 1 % cream APPLY EXTERNALLY TO THE AFFECTED AREA DAILY 06/03/17   Suzan Slick, NP  sucralfate (CARAFATE) 1 GM/10ML suspension Take 10 mLs (1 g total) by mouth 4  (four) times daily. 01/24/18   Paulette Blanch, MD    Allergies Patient has no known allergies.  Family History  Problem Relation Age of Onset  . Hypertension Mother   . Stroke Mother   . Diabetes Father   . Renal Disease Father   . Hypertension Sister   . Diabetes Sister   . Renal Disease Sister   . Iron deficiency Sister     Social History Social History   Tobacco Use  . Smoking status: Never Smoker  . Smokeless tobacco: Never Used  Substance Use Topics  . Alcohol use: No  . Drug use: No    Review of Systems  Constitutional: No fever/chills Eyes: No visual changes. ENT: No sore throat. Cardiovascular: Denies chest pain. Respiratory: Denies shortness of breath. Gastrointestinal: Positive for abdominal pain.  No nausea, no vomiting.  No diarrhea.  No constipation. Genitourinary: Negative for dysuria. Musculoskeletal: Negative for back pain. Skin: Negative for rash.  Neurological: Negative for headaches, focal weakness or numbness.   ____________________________________________   PHYSICAL EXAM:  VITAL SIGNS: ED Triage Vitals  Enc Vitals Group     BP 01/24/18 0048 (!) 175/116     Pulse Rate 01/24/18 0048 72     Resp 01/24/18 0048 18     Temp 01/24/18 0048 98.2 F (36.8 C)     Temp Source 01/24/18 0048 Oral     SpO2 01/24/18 0048 98 %     Weight 01/24/18 0049 259 lb (117.5 kg)     Height 01/24/18 0049 5\' 11"  (1.803 m)     Head Circumference --      Peak Flow --      Pain Score 01/24/18 0048 9     Pain Loc --      Pain Edu? --      Excl. in [place]? --     Constitutional: Alert and oriented. Well appearing and in no acute distress. Eyes: Conjunctivae are normal. PERRL. EOMI. Head: Atraumatic. Nose: No congestion/rhinnorhea. Mouth/Throat: Mucous membranes are moist.  Oropharynx non-erythematous. Neck: No stridor.   Cardiovascular: Normal rate, regular rhythm. Grossly normal heart sounds.  Good peripheral circulation. Respiratory: Normal respiratory effort.   No retractions. Lungs CTAB. Gastrointestinal: Soft and mildly tender to palpation epigastrium without rebound or guarding. No distention. No abdominal bruits. No CVA tenderness. Genitourinary: Reducible left inguinal hernia which is not painful. Musculoskeletal: No lower extremity tenderness nor edema.  No joint effusions. Neurologic:  Normal speech and language. No gross focal neurologic deficits are appreciated. No gait instability. Skin:  Skin is warm, dry and intact. No rash noted. Psychiatric: Mood and affect are normal. Speech and behavior are normal.  ____________________________________________   LABS (all labs ordered are listed, but only abnormal results are displayed)  Labs Reviewed  COMPREHENSIVE METABOLIC PANEL - Abnormal; Notable for the following components:      Result Value   Glucose, Bld 203 (*)    BUN 21 (*)    Creatinine, Ser 1.44 (*)    GFR calc non Af Amer 57 (*)    All other components within normal limits  CBC WITH DIFFERENTIAL/PLATELET  LIPASE, BLOOD  ETHANOL   ____________________________________________  EKG  None ____________________________________________  RADIOLOGY  ED MD interpretation: Imaging studies from prior visit last night reviewed  Official radiology report(s): Ct Abdomen Pelvis W Contrast  Result Date: 01/23/2018 CLINICAL DATA:  Abdominal pain. EXAM: CT ABDOMEN AND PELVIS WITH CONTRAST TECHNIQUE: Multidetector CT imaging of the abdomen and pelvis was performed using the standard protocol following bolus administration of intravenous contrast. CONTRAST:  116mL ISOVUE-300 IOPAMIDOL (ISOVUE-300) INJECTION 61% COMPARISON:  None. FINDINGS: Lower chest: The lung bases are clear. Hepatobiliary: Focal fatty infiltration adjacent adjacent to the falciform ligament. No suspicious hepatic lesion. Gallbladder partially distended. No calcified stone or pericholecystic inflammation. No biliary dilatation. Pancreas: No ductal dilatation or  inflammation. Spleen: Normal in size without focal abnormality. Splenule is anteriorly. Adrenals/Urinary Tract: Normal adrenal glands. No hydronephrosis. Symmetric bilateral perinephric edema is nonspecific. 18 mm cyst in the upper left kidney. The urinary bladder is distended and extends into a left inguinal hernia. Mild wall thickening of the bladder at the hernia neck. Stomach/Bowel: Stomach is nondistended. No bowel wall thickening, inflammatory change or obstruction. Single diverticulum at the hepatic flexure of the colon. Normal appendix. Vascular/Lymphatic: Mild external iliac atherosclerosis. Normal caliber abdominal aorta. No enlarged abdominal or pelvic lymph nodes. Reproductive: Prostate is unremarkable. Other: Left inguinal hernia contains fat and portions of  the urinary bladder. Right inguinal hernia contains only fat. No ascites or free air. Tiny fat containing umbilical hernia. Musculoskeletal: Degenerative disc disease at L4-L5. There are no acute or suspicious osseous abnormalities. IMPRESSION: 1. Urinary bladder extends into a left inguinal hernia with mild wall thickening at the hernia neck which may reflect mechanical inflammation. 2. Nonspecific bilateral perinephric edema, can be seen with pyelonephritis or acute or chronic renal disease. Electronically Signed   By: Jeb Levering M.D.   On: 01/23/2018 06:31   US Abdomen Limited Ruq  Result Date: 01/23/2018 CLINICAL DATA:  Right upper quadrant pain. EXAM: ULTRASOUND ABDOMEN LIMITED RIGHT UPPER QUADRANT COMPARISON:  None. FINDINGS: Gallbladder: Partially distended. Mild wall thickening of 3-4 mm. Echogenic structure adherent to the gallbladder wall, non mobile nonshadowing consistent with polyp measuring 4 mm. No shadowing stones. No sonographic Murphy sign noted by sonographer. Common bile duct: Diameter: 4 mm, normal. Liver: No focal lesion identified. Within normal limits in parenchymal echogenicity. Portal vein is patent on color  Doppler imaging with normal direction of blood flow towards the liver. IMPRESSION: 1. Partially distended gallbladder. Mild gallbladder wall thickening is nonspecific and may be due to degree of distension. 2. No gallstones. Small gallbladder polyp measuring 4 mm, no dedicated follow-up imaging recommended due to size. Electronically Signed   By: Jeb Levering M.D.   On: 01/23/2018 04:44    ____________________________________________   PROCEDURES  Procedure(s) performed: None  Procedures  Critical Care performed: No  ____________________________________________   INITIAL IMPRESSION / ASSESSMENT AND PLAN / ED COURSE  As part of my medical decision making, I reviewed the following data within the [facility] notes reviewed and incorporated, Labs reviewed, Old chart reviewed and Notes from prior ED visits   47 year old male who returns to the ED for epigastric pain. Differential diagnosis includes, but is not limited to, biliary disease (biliary colic, acute cholecystitis, cholangitis, choledocholithiasis, etc), intrathoracic causes for epigastric abdominal pain including ACS, gastritis, duodenitis, pancreatitis, small bowel or large bowel obstruction, abdominal aortic aneurysm, hernia, and ulcer(s).  Patient only complains of epigastric pain; does not complain of pain in his left groin/inguinal hernia.  Symptoms seem to be tied with movement.  He has had intermittent pain since 2011 without GI evaluation.  Some elements of patient's symptoms suggest PUD.  I personally reviewed patient's ED visits from last night; he had relief from IV Toradol.  Given my suspicion for PUD, I will hold off on NSAIDs and instead administer 20 mg IV Pepcid as well as 20 mg oral Bentyl.  Will repeat LFTs and lipase to ensure their stability.   Clinical Course as of Jan 24 302  Fri Jan 24, 2018  0259 Updated patient on stable lab work.  Overall improved.  Will discharge home with  prescriptions for Protonix, Carafate and Bentyl.  Encouraged him to follow-up with Dr. Marius Ditch as previously directed.  Will refer to outpatient surgery for further evaluation of left inguinal hernia seen on CT scan.   [JS]    Clinical Course User Index [JS] Paulette Blanch, MD     ____________________________________________   FINAL CLINICAL IMPRESSION(S) / ED DIAGNOSES  Final diagnoses:  Epigastric pain  Gastroesophageal reflux disease, esophagitis presence not specified  Unilateral inguinal hernia without obstruction or gangrene, recurrence not specified     ED Discharge Orders        Ordered    pantoprazole (PROTONIX) 40 MG tablet  Daily     01/24/18 0303  dicyclomine (BENTYL) 20 MG tablet  Every 6 hours PRN     01/24/18 0303    sucralfate (CARAFATE) 1 GM/10ML suspension  4 times daily     01/24/18 0303       Note:  This document was prepared using Dragon voice recognition software and may include unintentional dictation errors.    Paulette Blanch, MD 01/24/18 [phone]

## 2018-01-27 ENCOUNTER — Ambulatory Visit (INDEPENDENT_AMBULATORY_CARE_PROVIDER_SITE_OTHER): Payer: BLUE CROSS/BLUE SHIELD | Admitting: Gastroenterology

## 2018-01-27 ENCOUNTER — Other Ambulatory Visit: Payer: Self-pay

## 2018-01-27 VITALS — BP 147/93 | HR 69 | Ht 71.0 in | Wt 259.2 lb

## 2018-01-27 DIAGNOSIS — Z1211 Encounter for screening for malignant neoplasm of colon: Secondary | ICD-10-CM | POA: Diagnosis not present

## 2018-01-27 DIAGNOSIS — R1013 Epigastric pain: Secondary | ICD-10-CM | POA: Diagnosis not present

## 2018-01-27 DIAGNOSIS — G8929 Other chronic pain: Secondary | ICD-10-CM

## 2018-01-27 MED ORDER — NA SULFATE-K SULFATE-MG SULF 17.5-3.13-1.6 GM/177ML PO SOLN: 1.0000 | Freq: Once | ORAL | 0 refills | Status: AC

## 2018-01-27 NOTE — Patient Instructions (Signed)
Heart-Healthy Eating Plan Heart-healthy meal planning includes:  Limiting unhealthy fats.  Increasing healthy fats.  Making other small dietary changes.  You may need to talk with your doctor or a diet specialist (dietitian) to create an eating plan that is right for you. What types of fat should I choose?  Choose healthy fats. These include olive oil and canola oil, flaxseeds, walnuts, almonds, and seeds.  Eat more omega-3 fats. These include salmon, mackerel, sardines, tuna, flaxseed oil, and ground flaxseeds. Try to eat fish at least twice each week.  Limit saturated fats. ? Saturated fats are often found in animal products, such as meats, butter, and cream. ? Plant sources of saturated fats include palm oil, palm kernel oil, and coconut oil.  Avoid foods with partially hydrogenated oils in them. These include stick margarine, some tub margarines, cookies, crackers, and other baked goods. These contain trans fats. What general guidelines do I need to follow?  Check food labels carefully. Identify foods with trans fats or high amounts of saturated fat.  Fill one half of your plate with vegetables and green salads. Eat 4-5 servings of vegetables per day. A serving of vegetables is: ? 1 cup of raw leafy vegetables. ?  cup of raw or cooked cut-up vegetables. ?  cup of vegetable juice.  Fill one fourth of your plate with whole grains. Look for the word "whole" as the first word in the ingredient list.  Fill one fourth of your plate with lean protein foods.  Eat 4-5 servings of fruit per day. A serving of fruit is: ? One medium whole fruit. ?  cup of dried fruit. ?  cup of fresh, frozen, or canned fruit. ?  cup of 100% fruit juice.  Eat more foods that contain soluble fiber. These include apples, broccoli, carrots, beans, peas, and barley. Try to get 20-30 g of fiber per day.  Eat more home-cooked food. Eat less restaurant, buffet, and fast food.  Limit or avoid  alcohol.  Limit foods high in starch and sugar.  Avoid fried foods.  Avoid frying your food. Try baking, boiling, grilling, or broiling it instead. You can also reduce fat by: ? Removing the skin from poultry. ? Removing all visible fats from meats. ? Skimming the fat off of stews, soups, and gravies before serving them. ? Steaming vegetables in water or broth.  Lose weight if you are overweight.  Eat 4-5 servings of nuts, legumes, and seeds per week: ? One serving of dried beans or legumes equals  cup after being cooked. ? One serving of nuts equals 1 ounces. ? One serving of seeds equals  ounce or one tablespoon.  You may need to keep track of how much salt or sodium you eat. This is especially true if you have high blood pressure. Talk with your doctor or dietitian to get more information. What foods can I eat? Grains Breads, including French, white, pita, wheat, raisin, rye, oatmeal, and Italian. Tortillas that are neither fried nor made with lard or trans fat. Low-fat rolls, including hotdog and hamburger buns and English muffins. Biscuits. Muffins. Waffles. Pancakes. Light popcorn. Whole-grain cereals. Flatbread. Melba toast. Pretzels. Breadsticks. Rusks. Low-fat snacks. Low-fat crackers, including oyster, saltine, matzo, graham, animal, and rye. Rice and pasta, including brown rice and pastas that are made with whole wheat. Vegetables All vegetables. Fruits All fruits, but limit coconut. Meats and Other Protein Sources Lean, well-trimmed beef, veal, pork, and lamb. Chicken and turkey without skin. All fish and shellfish.   Wild duck, rabbit, pheasant, and venison. Egg whites or low-cholesterol egg substitutes. Dried beans, peas, lentils, and tofu. Seeds and most nuts. Dairy Low-fat or nonfat cheeses, including ricotta, string, and mozzarella. Skim or 1% milk that is liquid, powdered, or evaporated. Buttermilk that is made with low-fat milk. Nonfat or low-fat  yogurt. Beverages Mineral water. Diet carbonated beverages. Sweets and Desserts Sherbets and fruit ices. Honey, jam, marmalade, jelly, and syrups. Meringues and gelatins. Pure sugar candy, such as hard candy, jelly beans, gumdrops, mints, marshmallows, and small amounts of dark chocolate. [employer] Eat all sweets and desserts in moderation. Fats and Oils Nonhydrogenated (trans-free) margarines. Vegetable oils, including soybean, sesame, sunflower, olive, peanut, safflower, corn, canola, and cottonseed. Salad dressings or mayonnaise made with a vegetable oil. Limit added fats and oils that you use for cooking, baking, salads, and as spreads. Other Cocoa powder. Coffee and tea. All seasonings and condiments. The items listed above may not be a complete list of recommended foods or beverages. Contact your dietitian for more options. What foods are not recommended? Grains Breads that are made with saturated or trans fats, oils, or whole milk. Croissants. Butter rolls. Cheese breads. Sweet rolls. Donuts. Buttered popcorn. Chow mein noodles. High-fat crackers, such as cheese or butter crackers. Meats and Other Protein Sources Fatty meats, such as hotdogs, short ribs, sausage, spareribs, bacon, rib eye roast or steak, and mutton. High-fat deli meats, such as salami and bologna. Caviar. Domestic duck and goose. Organ meats, such as kidney, liver, sweetbreads, and heart. Dairy Cream, sour cream, cream cheese, and creamed cottage cheese. Whole-milk cheeses, including blue (bleu), Monterey Jack, [place], [place], American, [place], Swiss, cheddar, [place], and [place]. Whole or 2% milk that is liquid, evaporated, or condensed. Whole buttermilk. Cream sauce or high-fat cheese sauce. Yogurt that is made from whole milk. Beverages Regular sodas and juice drinks with added sugar. Sweets and Desserts Frosting. Pudding. Cookies. Cakes other than angel food cake. Candy that has milk chocolate or white  chocolate, hydrogenated fat, butter, coconut, or unknown ingredients. Buttered syrups. Full-fat ice cream or ice cream drinks. Fats and Oils Gravy that has suet, meat fat, or shortening. Cocoa butter, hydrogenated oils, palm oil, coconut oil, palm kernel oil. These can often be found in baked products, candy, fried foods, nondairy creamers, and whipped toppings. Solid fats and shortenings, including bacon fat, salt pork, lard, and butter. Nondairy cream substitutes, such as coffee creamers and sour cream substitutes. Salad dressings that are made of unknown oils, cheese, or sour cream. The items listed above may not be a complete list of foods and beverages to avoid. Contact your dietitian for more information. This information is not intended to replace advice given to you by your health care provider. Make sure you discuss any questions you have with your health care provider. Document Released: 01/01/2012 Document Revised: 12/08/2015 Document Reviewed: 12/24/2013 Elsevier Interactive Patient Education  2018 [employer].  Heart-Healthy Eating Plan Many factors influence your heart health, including eating and exercise habits. Heart (coronary) risk increases with abnormal blood fat (lipid) levels. Heart-healthy meal planning includes limiting unhealthy fats, increasing healthy fats, and making other small dietary changes. This includes maintaining a healthy body weight to help keep lipid levels within a normal range. What is my plan? Your health care provider recommends that you:  Get no more than _________% of the total calories in your daily diet from fat.  Limit your intake of saturated fat to less than _________% of your total calories each  day.  Limit the amount of cholesterol in your diet to less than _________ mg per day.  What types of fat should I choose?  Choose healthy fats more often. Choose monounsaturated and polyunsaturated fats, such as olive oil and canola oil, flaxseeds,  walnuts, almonds, and seeds.  Eat more omega-3 fats. Good choices include salmon, mackerel, sardines, tuna, flaxseed oil, and ground flaxseeds. Aim to eat fish at least two times each week.  Limit saturated fats. Saturated fats are primarily found in animal products, such as meats, butter, and cream. Plant sources of saturated fats include palm oil, palm kernel oil, and coconut oil.  Avoid foods with partially hydrogenated oils in them. These contain trans fats. Examples of foods that contain trans fats are stick margarine, some tub margarines, cookies, crackers, and other baked goods. What general guidelines do I need to follow?  Check food labels carefully to identify foods with trans fats or high amounts of saturated fat.  Fill one half of your plate with vegetables and green salads. Eat 4-5 servings of vegetables per day. A serving of vegetables equals 1 cup of raw leafy vegetables,  cup of raw or cooked cut-up vegetables, or  cup of vegetable juice.  Fill one fourth of your plate with whole grains. Look for the word "whole" as the first word in the ingredient list.  Fill one fourth of your plate with lean protein foods.  Eat 4-5 servings of fruit per day. A serving of fruit equals one medium whole fruit,  cup of dried fruit,  cup of fresh, frozen, or canned fruit, or  cup of 100% fruit juice.  Eat more foods that contain soluble fiber. Examples of foods that contain this type of fiber are apples, broccoli, carrots, beans, peas, and barley. Aim to get 20-30 g of fiber per day.  Eat more home-cooked food and less restaurant, buffet, and fast food.  Limit or avoid alcohol.  Limit foods that are high in starch and sugar.  Avoid fried foods.  Cook foods by using methods other than frying. Baking, boiling, grilling, and broiling are all great options. Other fat-reducing suggestions include: ? Removing the skin from poultry. ? Removing all visible fats from meats. ? Skimming the  fat off of stews, soups, and gravies before serving them. ? Steaming vegetables in water or broth.  Lose weight if you are overweight. Losing just 5-10% of your initial body weight can help your overall health and prevent diseases such as diabetes and heart disease.  Increase your consumption of nuts, legumes, and seeds to 4-5 servings per week. One serving of dried beans or legumes equals  cup after being cooked, one serving of nuts equals 1 ounces, and one serving of seeds equals  ounce or 1 tablespoon.  You may need to monitor your salt (sodium) intake, especially if you have high blood pressure. Talk with your health care provider or dietitian to get more information about reducing sodium. What foods can I eat? Grains  Breads, including [place], white, pita, wheat, raisin, rye, oatmeal, and [place]. Tortillas that are neither fried nor made with lard or trans fat. Low-fat rolls, including hotdog and hamburger buns and English muffins. Biscuits. Muffins. Waffles. Pancakes. Light popcorn. Whole-grain cereals. Flatbread. Melba toast. Pretzels. Breadsticks. Rusks. Low-fat snacks and crackers, including oyster, saltine, matzo, graham, animal, and rye. Rice and pasta, including brown rice and those that are made with whole wheat. Vegetables All vegetables. Fruits All fruits, but limit coconut. Meats and Other Protein  Sources Lean, well-trimmed beef, veal, pork, and lamb. Chicken and [place] without skin. All fish and shellfish. Wild duck, rabbit, pheasant, and venison. Egg whites or low-cholesterol egg substitutes. Dried beans, peas, lentils, and tofu.Seeds and most nuts. Dairy Low-fat or nonfat cheeses, including ricotta, string, and mozzarella. Skim or 1% milk that is liquid, powdered, or evaporated. Buttermilk that is made with low-fat milk. Nonfat or low-fat yogurt. Beverages Mineral water. Diet carbonated beverages. Sweets and Desserts Sherbets and fruit ices. Honey, jam, marmalade,  jelly, and syrups. Meringues and gelatins. Pure sugar candy, such as hard candy, jelly beans, gumdrops, mints, marshmallows, and small amounts of dark chocolate. [employer] Eat all sweets and desserts in moderation. Fats and Oils Nonhydrogenated (trans-free) margarines. Vegetable oils, including soybean, sesame, sunflower, olive, peanut, safflower, corn, canola, and cottonseed. Salad dressings or mayonnaise that are made with a vegetable oil. Limit added fats and oils that you use for cooking, baking, salads, and as spreads. Other Cocoa powder. Coffee and tea. All seasonings and condiments. The items listed above may not be a complete list of recommended foods or beverages. Contact your dietitian for more options. What foods are not recommended? Grains Breads that are made with saturated or trans fats, oils, or whole milk. Croissants. Butter rolls. Cheese breads. Sweet rolls. Donuts. Buttered popcorn. Chow mein noodles. High-fat crackers, such as cheese or butter crackers. Meats and Other Protein Sources Fatty meats, such as hotdogs, short ribs, sausage, spareribs, bacon, ribeye roast or steak, and mutton. High-fat deli meats, such as salami and bologna. Caviar. Domestic duck and goose. Organ meats, such as kidney, liver, sweetbreads, brains, gizzard, chitterlings, and heart. Dairy Cream, sour cream, cream cheese, and creamed cottage cheese. Whole milk cheeses, including blue (bleu), Monterey Jack, [place], [place], American, [place], Swiss, [place], [place], and [place]. Whole or 2% milk that is liquid, evaporated, or condensed. Whole buttermilk. Cream sauce or high-fat cheese sauce. Yogurt that is made from whole milk. Beverages Regular sodas and drinks with added sugar. Sweets and Desserts Frosting. Pudding. Cookies. Cakes other than angel food cake. Candy that has milk chocolate or white chocolate, hydrogenated fat, butter, coconut, or unknown ingredients. Buttered syrups. Full-fat ice cream  or ice cream drinks. Fats and Oils Gravy that has suet, meat fat, or shortening. Cocoa butter, hydrogenated oils, palm oil, coconut oil, palm kernel oil. These can often be found in baked products, candy, fried foods, nondairy creamers, and whipped toppings. Solid fats and shortenings, including bacon fat, salt pork, lard, and butter. Nondairy cream substitutes, such as coffee creamers and sour cream substitutes. Salad dressings that are made of unknown oils, cheese, or sour cream. The items listed above may not be a complete list of foods and beverages to avoid. Contact your dietitian for more information. This information is not intended to replace advice given to you by your health care provider. Make sure you discuss any questions you have with your health care provider. Document Released: 04/10/2008 Document Revised: 01/20/2016 Document Reviewed: 12/24/2013 Elsevier Interactive Patient Education  [employer].

## 2018-01-27 NOTE — Progress Notes (Signed)
[name] Darby, MD [address]  [place]  [place], [place] [address]  Main: [phone]  Fax: [phone]    Gastroenterology Consultation  Referring Provider:     Tracie Harrier, MD Primary Care Physician:  Tracie Harrier, MD Primary Gastroenterologist:  Dr. Cephas Price Reason for Consultation:     Chronic epigastric pain        HPI:   [name] is a 47 y.o. African-American male referred by Dr. Tracie Harrier, MD  for consultation & management of chronic epigastric pain.patient is to metabolic syndrome, reports that he has been suffering from intermittent episodes of sudden onset of epigastric pain since 2011. He has been diabetic prior to 2011, his initial A1c was 15.5, currently on insulin. He reports episodes of upper abdominal pain to be sudden onset, sharp, worse with standing and feels better by lying down. These episodes occur once every few weeks to 6 months or so. Most recent episode was last week when he went to [facility] ER. His basic labs were unremarkable, ultrasound revealed fatty liver, CT A/P revealed left inguinal hernia. He was discharged on Bentyl and Pepcid. He said he could not afford Bentyl. He did not try Pepcid. He denies any relation to food, not associated with nausea, vomiting, bloating, early satiety. He denies weight loss. Over the years, his A1c has improved but still high. Most recent value is 9.7. He denies smoking or drinking alcohol. He denies any lower GI symptoms   NSAIDs: none  Antiplts/Anticoagulants/Anti thrombotics: on rivaroxaban with history of chronic DVT since 03/2017. Patient reports that the repeat ultrasound Doppler was inconclusive, therefore he decided to stay on the medication, however he is taking only twice weekly.  GI Procedures: none He denies family history of GI malignancy  Past Medical History:  Diagnosis Date  . ARF (acute renal failure) ([place])   . Cellulitis of right lower  leg 03/31/2017  . Chronic anticoagulation 03/31/2017  . Diabetes mellitus without complication ([place])   . Diabetic ulcer of calf ([place]) 03/31/2017  . DVT of proximal lower limb ([place]) 03/20/2017   R calf area  . Gout   . Hypertension   . Hypoalbuminemia 03/31/2017  . Necrotizing fasciitis ([place])   . Right leg DVT ([place]) 03/31/2017  . Sinus tachycardia 03/31/2017  . Type 2 diabetes mellitus with stage 2 chronic kidney disease, with long-term current use of insulin ([place]) 03/27/2017    Past Surgical History:  Procedure Laterality Date  . I&D EXTREMITY Right 04/03/2017   Procedure: IRRIGATION AND DEBRIDEMENT RIGHT LEG, APPLY WOUND VAC;  Surgeon: Newt Minion, MD;  Location: [place];  Service: Orthopedics;  Laterality: Right;  . I&D EXTREMITY Right 04/05/2017   Procedure: IRRIGATION AND DEBRIDEMENT RIGHT LEG;  Surgeon: Newt Minion, MD;  Location: [place];  Service: Orthopedics;  Laterality: Right;  . SCROTAL SURGERY  2006  . SKIN SPLIT GRAFT Right 04/10/2017   Procedure: SKIN GRAFT SPLIT THICKNESS RIGHT LEG;  Surgeon: Newt Minion, MD;  Location: [place];  Service: Orthopedics;  Laterality: Right;    Current Outpatient Medications:  .  ACCU-CHEK FASTCLIX LANCETS MISC, U UTD TO CHECK BLOOD SUGAR TID, Disp: , Rfl: 4 .  ACCU-CHEK GUIDE test strip, USE TO TEST QID UTD, Disp: , Rfl: 1 .  Amino Acids-Protein Hydrolys (FEEDING SUPPLEMENT, PRO-STAT SUGAR FREE 64,) LIQD, Take 30 mLs by mouth 3 (three) times daily between meals., Disp: 900 mL, Rfl: 0 .  amLODipine (NORVASC)  10 MG tablet, Take 10 mg by mouth daily., Disp: , Rfl:  .  cyclobenzaprine (FLEXERIL) 5 MG tablet, Take 1 tablet (5 mg total) by mouth 3 (three) times daily as needed for muscle spasms., Disp: 30 tablet, Rfl: 0 .  ferrous sulfate 325 (65 FE) MG EC tablet, Take 325 mg by mouth 3 (three) times daily with meals., Disp: , Rfl:  .  glucose blood (ACCU-CHEK COMPACT PLUS) test strip, Use 4 (four) times daily Use as instructed., Disp: , Rfl:  .   hydrALAZINE (APRESOLINE) 50 MG tablet, Take 50 mg by mouth 3 (three) times daily., Disp: , Rfl:  .  insulin aspart (NOVOLOG FLEXPEN) 100 UNIT/ML FlexPen, Inject into the skin. Sliding scale 121-150 2 units; 151-200 3 units; 201-250 5 units; 251-300 8 units; 301-350 11 units; 351-400 15 units, grater than 400 - 15 units, call doctor., Disp: , Rfl:  .  Lancets Misc. (UNISTIK 2 NORMAL) MISC, Use 1 each 3 (three) times daily Use as instructed., Disp: , Rfl:  .  lisinopril (PRINIVIL,ZESTRIL) 10 MG tablet, Take 10 mg by mouth daily., Disp: , Rfl:  .  metoprolol tartrate (LOPRESSOR) 100 MG tablet, Take 1 tablet (100 mg total) by mouth 2 (two) times daily., Disp: , Rfl:  .  rivaroxaban (XARELTO) 20 MG TABS tablet, Take 20 mg by mouth daily with supper., Disp: , Rfl:  .  Na Sulfate-K Sulfate-Mg Sulf 17.5-3.13-1.6 GM/177ML SOLN, Take 1 kit by mouth once for 1 dose., Disp: 354 mL, Rfl: 0    Family History  Problem Relation Age of Onset  . Hypertension Mother   . Stroke Mother   . Diabetes Father   . Renal Disease Father   . Hypertension Sister   . Diabetes Sister   . Renal Disease Sister   . Iron deficiency Sister      Social History   Tobacco Use  . Smoking status: Never Smoker  . Smokeless tobacco: Never Used  Substance Use Topics  . Alcohol use: No  . Drug use: No    Allergies as of 01/27/2018  . (No Known Allergies)    Review of Systems:    All systems reviewed and negative except where noted in HPI.   Physical Exam:  BP (!) 147/93   Pulse 69   Ht '5\' 11"'  (1.803 m)   Wt 259 lb 3.2 oz (117.6 kg)   BMI 36.15 kg/m  No LMP for male patient.  General:   Alert,  Well-developed, well-nourished, pleasant and cooperative in NAD Head:  Normocephalic and atraumatic. Eyes:  Sclera clear, no icterus.   Conjunctiva pink. Ears:  Normal auditory acuity. Nose:  No deformity, discharge, or lesions. Mouth:  No deformity or lesions,oropharynx pink & moist. Neck:  Supple; no masses or  thyromegaly. Lungs:  Respirations even and unlabored.  Clear throughout to auscultation.   No wheezes, crackles, or rhonchi. No acute distress. Heart:  Regular rate and rhythm; no murmurs, clicks, rubs, or gallops. Abdomen:  Normal bowel sounds. Soft,Obese, non-tender and non-distended without masses, hepatosplenomegaly, left inguinal hernia, nontender.  No guarding or rebound tenderness.   Rectal: Not performed Msk:  Symmetrical without gross deformities. Good, equal movement & strength bilaterally. Pulses:  Normal pulses noted. Extremities:  No clubbing, 1+ edema.  No cyanosis. Neurologic:  Alert and oriented x3;  grossly normal neurologically. Skin:  Intact without significant lesions or rashes. No jaundice. Lymph Nodes:  No significant cervical adenopathy. Psych:  Alert and cooperative. Normal mood and affect.  Imaging Studies: reviewed  Assessment and Plan:   KAIDYN JAVID is a 47 y.o. African-American male with metabolic syndrome, poorly controlled diabetes mellitus presents with chronic intermittent episodes of epigastric pain without any other constitutional symptoms. Most recent evaluation including CBC, LFTs, lipase, ultrasound and CT A/P were fairly unremarkable except for evidence of left inguinal hernia. Differentials include pain secondary to gastroparesis or functional dyspepsia or peptic ulcer disease or musculoskeletal or H pyloric gastritis or less likely malignancy  - discussed with him about tight control of diabetes - Provided him with information about [facility] - I will perform EGD for further evaluation - He is also overdue for colon cancer screening, schedule colonoscopy along with EGD with 2 day prep - recommended him to hold rivaroxaban for 3 days prior to procedure  I have discussed alternative options, risks & benefits,  which include, but are not limited to, bleeding, infection, perforation,respiratory complication & drug  reaction.  The patient agrees with this plan & written consent will be obtained.     Follow up in 2 months   [name] Darby, MD

## 2018-01-30 ENCOUNTER — Other Ambulatory Visit: Payer: Self-pay

## 2018-01-30 DIAGNOSIS — G8929 Other chronic pain: Secondary | ICD-10-CM

## 2018-01-30 DIAGNOSIS — Z1211 Encounter for screening for malignant neoplasm of colon: Secondary | ICD-10-CM

## 2018-01-30 DIAGNOSIS — R1013 Epigastric pain: Principal | ICD-10-CM

## 2018-02-06 DIAGNOSIS — I872 Venous insufficiency (chronic) (peripheral): Secondary | ICD-10-CM | POA: Insufficient documentation

## 2018-02-07 ENCOUNTER — Encounter: Payer: Self-pay | Admitting: Student

## 2018-02-10 ENCOUNTER — Ambulatory Visit: Payer: BLUE CROSS/BLUE SHIELD | Admitting: Anesthesiology

## 2018-02-10 ENCOUNTER — Ambulatory Visit
Admission: RE | Admit: 2018-02-10 | Discharge: 2018-02-10 | Disposition: A | Discharge status: Home / Self Care | Payer: BLUE CROSS/BLUE SHIELD | Source: Ambulatory Visit | Attending: Gastroenterology | Admitting: Gastroenterology

## 2018-02-10 ENCOUNTER — Encounter: Payer: Self-pay | Admitting: *Deleted

## 2018-02-10 ENCOUNTER — Encounter
Admission: RE | Disposition: A | Discharge status: Home / Self Care | Payer: Self-pay | Source: Ambulatory Visit | Attending: Gastroenterology

## 2018-02-10 DIAGNOSIS — E1122 Type 2 diabetes mellitus with diabetic chronic kidney disease: Secondary | ICD-10-CM | POA: Diagnosis not present

## 2018-02-10 DIAGNOSIS — K3189 Other diseases of stomach and duodenum: Secondary | ICD-10-CM

## 2018-02-10 DIAGNOSIS — N182 Chronic kidney disease, stage 2 (mild): Secondary | ICD-10-CM | POA: Insufficient documentation

## 2018-02-10 DIAGNOSIS — K5730 Diverticulosis of large intestine without perforation or abscess without bleeding: Secondary | ICD-10-CM | POA: Insufficient documentation

## 2018-02-10 DIAGNOSIS — R1013 Epigastric pain: Secondary | ICD-10-CM | POA: Insufficient documentation

## 2018-02-10 DIAGNOSIS — Z7901 Long term (current) use of anticoagulants: Secondary | ICD-10-CM | POA: Diagnosis not present

## 2018-02-10 DIAGNOSIS — K648 Other hemorrhoids: Secondary | ICD-10-CM | POA: Diagnosis not present

## 2018-02-10 DIAGNOSIS — K635 Polyp of colon: Secondary | ICD-10-CM | POA: Diagnosis not present

## 2018-02-10 DIAGNOSIS — G8929 Other chronic pain: Secondary | ICD-10-CM

## 2018-02-10 DIAGNOSIS — Z1211 Encounter for screening for malignant neoplasm of colon: Secondary | ICD-10-CM | POA: Insufficient documentation

## 2018-02-10 DIAGNOSIS — Z794 Long term (current) use of insulin: Secondary | ICD-10-CM | POA: Diagnosis not present

## 2018-02-10 DIAGNOSIS — D124 Benign neoplasm of descending colon: Secondary | ICD-10-CM | POA: Insufficient documentation

## 2018-02-10 DIAGNOSIS — B9681 Helicobacter pylori [H. pylori] as the cause of diseases classified elsewhere: Secondary | ICD-10-CM | POA: Diagnosis not present

## 2018-02-10 DIAGNOSIS — Z79899 Other long term (current) drug therapy: Secondary | ICD-10-CM | POA: Diagnosis not present

## 2018-02-10 DIAGNOSIS — K2950 Unspecified chronic gastritis without bleeding: Secondary | ICD-10-CM | POA: Diagnosis not present

## 2018-02-10 DIAGNOSIS — K228 Other specified diseases of esophagus: Secondary | ICD-10-CM

## 2018-02-10 DIAGNOSIS — I129 Hypertensive chronic kidney disease with stage 1 through stage 4 chronic kidney disease, or unspecified chronic kidney disease: Secondary | ICD-10-CM | POA: Diagnosis not present

## 2018-02-10 DIAGNOSIS — D122 Benign neoplasm of ascending colon: Secondary | ICD-10-CM | POA: Diagnosis not present

## 2018-02-10 DIAGNOSIS — K219 Gastro-esophageal reflux disease without esophagitis: Secondary | ICD-10-CM | POA: Insufficient documentation

## 2018-02-10 DIAGNOSIS — D120 Benign neoplasm of cecum: Secondary | ICD-10-CM

## 2018-02-10 DIAGNOSIS — Z86718 Personal history of other venous thrombosis and embolism: Secondary | ICD-10-CM | POA: Diagnosis not present

## 2018-02-10 HISTORY — PX: COLONOSCOPY WITH PROPOFOL: SHX5780

## 2018-02-10 HISTORY — DX: Personal history of other diseases of the digestive system: Z87.19

## 2018-02-10 HISTORY — PX: ESOPHAGOGASTRODUODENOSCOPY (EGD) WITH PROPOFOL: SHX5813

## 2018-02-10 LAB — GLUCOSE, CAPILLARY: Glucose-Capillary: 127 mg/dL — ABNORMAL HIGH (ref 70–99)

## 2018-02-10 SURGERY — COLONOSCOPY WITH PROPOFOL
Anesthesia: General

## 2018-02-10 MED ORDER — MIDAZOLAM HCL 2 MG/2ML IJ SOLN
INTRAMUSCULAR | Status: AC
  Filled 2018-02-10: qty 2

## 2018-02-10 MED ORDER — GLYCOPYRROLATE 0.2 MG/ML IJ SOLN
INTRAMUSCULAR | Status: AC
  Filled 2018-02-10: qty 1

## 2018-02-10 MED ORDER — GLYCOPYRROLATE 0.2 MG/ML IJ SOLN
INTRAMUSCULAR | Status: DC | PRN
  Administered 2018-02-10: 0.2 mg via INTRAVENOUS

## 2018-02-10 MED ORDER — MIDAZOLAM HCL 2 MG/2ML IJ SOLN
INTRAMUSCULAR | Status: DC | PRN
  Administered 2018-02-10: 2 mg via INTRAVENOUS

## 2018-02-10 MED ORDER — PROPOFOL 10 MG/ML IV BOLUS
INTRAVENOUS | Status: DC | PRN
  Administered 2018-02-10: 80 mg via INTRAVENOUS

## 2018-02-10 MED ORDER — PROPOFOL 500 MG/50ML IV EMUL
INTRAVENOUS | Status: AC
  Filled 2018-02-10: qty 50

## 2018-02-10 MED ORDER — LIDOCAINE HCL (CARDIAC) PF 100 MG/5ML IV SOSY
PREFILLED_SYRINGE | INTRAVENOUS | Status: DC | PRN
  Administered 2018-02-10: 40 mg via INTRAVENOUS

## 2018-02-10 MED ORDER — SODIUM CHLORIDE 0.9 % IV SOLN
INTRAVENOUS | Status: DC
  Administered 2018-02-10: 1000 mL via INTRAVENOUS

## 2018-02-10 MED ORDER — GLUCAGON HCL RDNA (DIAGNOSTIC) 1 MG IJ SOLR
INTRAMUSCULAR | Status: AC
  Filled 2018-02-10: qty 1

## 2018-02-10 MED ORDER — GLUCAGON HCL RDNA (DIAGNOSTIC) 1 MG IJ SOLR
INTRAMUSCULAR | Status: DC | PRN
  Administered 2018-02-10: 1 mg via INTRAVENOUS

## 2018-02-10 MED ORDER — PROPOFOL 500 MG/50ML IV EMUL
INTRAVENOUS | Status: DC | PRN
  Administered 2018-02-10: 150 ug/kg/min via INTRAVENOUS

## 2018-02-10 NOTE — Transfer of Care (Signed)
Immediate Anesthesia Transfer of Care Note  Patient: TYRONN GOLDA  Procedure(s) Performed: Procedure(s): COLONOSCOPY WITH PROPOFOL (N/A) ESOPHAGOGASTRODUODENOSCOPY (EGD) WITH PROPOFOL (N/A)  Patient Location: PACU and Endoscopy Unit  Anesthesia Type:General  Level of Consciousness: sedated  Airway & Oxygen Therapy: Patient Spontanous Breathing and Patient connected to nasal cannula oxygen  Post-op Assessment: Report given to RN and Post -op Vital signs reviewed and stable  Post vital signs: Reviewed and stable  Last Vitals:  Vitals:   02/10/18 0943 02/10/18 1112  BP: (!) 144/98 92/63  Pulse: 74 79  Resp: 20 20  Temp: (!) 35.9 C (!) 36.1 C  SpO2: 284% 13%    Complications: No apparent anesthesia complications

## 2018-02-10 NOTE — Anesthesia Preprocedure Evaluation (Signed)
Anesthesia Evaluation  Patient identified by MRN, date of birth, ID band Patient awake    Reviewed: Allergy & Precautions, H&P , NPO status , Patient's Chart, lab work & pertinent test results  History of Anesthesia Complications Negative for: history of anesthetic complications  Airway Mallampati: III  TM Distance: <3 FB Neck ROM: limited    Dental  (+) Chipped, Poor Dentition   Pulmonary neg pulmonary ROS, neg shortness of breath,           Cardiovascular Exercise Tolerance: Good hypertension, (-) angina+ Peripheral Vascular Disease  (-) Past MI and (-) DOE      Neuro/Psych negative neurological ROS  negative psych ROS   GI/Hepatic Neg liver ROS, hiatal hernia, GERD  Medicated and Controlled,  Endo/Other  diabetes, Type 2, Insulin Dependent  Renal/GU CRFRenal disease  negative genitourinary   Musculoskeletal   Abdominal   Peds  Hematology   Anesthesia Other Findings Past Medical History: No date: ARF (acute renal failure) ([place]) 03/31/2017: Cellulitis of right lower leg 03/31/2017: Chronic anticoagulation No date: Diabetes mellitus without complication ([place]) 2/72/5366: Diabetic ulcer of calf ([place]) 03/20/2017: DVT of proximal lower limb ([facility])     Comment:  R calf area No date: Gout No date: History of hiatal hernia No date: Hypertension 03/31/2017: Hypoalbuminemia No date: Necrotizing fasciitis ([place]) 03/31/2017: Right leg DVT ([place]) 03/31/2017: Sinus tachycardia 03/27/2017: Type 2 diabetes mellitus with stage 2 chronic kidney  disease, with long-term current use of insulin ([place])  Past Surgical History: 04/03/2017: I&D EXTREMITY; Right     Comment:  Procedure: IRRIGATION AND DEBRIDEMENT RIGHT LEG, APPLY               WOUND VAC;  Surgeon: Newt Minion, MD;  Location: [place];  Service: Orthopedics;  Laterality: Right; 04/05/2017: I&D EXTREMITY; Right     Comment:  Procedure: IRRIGATION AND  DEBRIDEMENT RIGHT LEG;                Surgeon: Newt Minion, MD;  Location: [place];  Service:               Orthopedics;  Laterality: Right; 2006: SCROTAL SURGERY 04/10/2017: SKIN SPLIT GRAFT; Right     Comment:  Procedure: SKIN GRAFT SPLIT THICKNESS RIGHT LEG;                Surgeon: Newt Minion, MD;  Location: [place];  Service:               Orthopedics;  Laterality: Right;  BMI    Body Mass Index:  35.62 kg/m      Reproductive/Obstetrics negative OB ROS                             Anesthesia Physical Anesthesia Plan  ASA: III  Anesthesia Plan: General   Post-op Pain Management:    Induction: Intravenous  PONV Risk Score and Plan: TIVA  Airway Management Planned: Natural Airway and Nasal Cannula  Additional Equipment:   Intra-op Plan:   Post-operative Plan:   Informed Consent: I have reviewed the patients History and Physical, chart, labs and discussed the procedure including the risks, benefits and alternatives for the proposed anesthesia with the patient or authorized representative who has indicated his/her understanding and acceptance.   Dental Advisory Given  Plan Discussed with: Anesthesiologist, CRNA and Surgeon  Anesthesia Plan  Comments: (Patient consented for risks of anesthesia including but not limited to:  - adverse reactions to medications - risk of intubation if required - damage to teeth, lips or other oral mucosa - sore throat or hoarseness - Damage to heart, brain, lungs or loss of life  Patient voiced understanding.)        Anesthesia Quick Evaluation

## 2018-02-10 NOTE — Op Note (Signed)
[facility] Gastroenterology Patient Name: [name] Procedure Date: 02/10/2018 10:04 AM MRN: [id number] Account #: [id number] Date of Birth: [birth year] Admit Type: Outpatient Age: 47 Room: [facility] ENDO ROOM 4 Gender: Male Note Status: Finalized Procedure:            Colonoscopy Indications:          Screening for colorectal malignant neoplasm, This is                        the patient's first colonoscopy Providers:            Lin Landsman MD, MD Medicines:            Monitored Anesthesia Care Complications:        No immediate complications. Estimated blood loss: None. Procedure:            Pre-Anesthesia Assessment:                       - Prior to the procedure, a History and Physical was                        performed, and patient medications and allergies were                        reviewed. The patient is competent. The risks and                        benefits of the procedure and the sedation options and                        risks were discussed with the patient. All questions                        were answered and informed consent was obtained.                        Patient identification and proposed procedure were                        verified by the physician, the nurse, the                        anesthesiologist, the anesthetist and the technician in                        the pre-procedure area in the procedure room in the                        endoscopy suite. Mental Status Examination: alert and                        oriented. Airway Examination: normal oropharyngeal                        airway and neck mobility. Respiratory Examination:                        clear to auscultation. CV Examination: normal.  Prophylactic Antibiotics: The patient does not require                        prophylactic antibiotics. Prior Anticoagulants: The                        patient has taken Xarelto (rivaroxaban), last  dose was                        5 days prior to procedure. ASA Grade Assessment: III -                        A patient with severe systemic disease. After reviewing                        the risks and benefits, the patient was deemed in                        satisfactory condition to undergo the procedure. The                        anesthesia plan was to use monitored anesthesia care                        (MAC). Immediately prior to administration of                        medications, the patient was re-assessed for adequacy                        to receive sedatives. The heart rate, respiratory rate,                        oxygen saturations, blood pressure, adequacy of                        pulmonary ventilation, and response to care were                        monitored throughout the procedure. The physical status                        of the patient was re-assessed after the procedure.                       After obtaining informed consent, the colonoscope was                        passed under direct vision. Throughout the procedure,                        the patient's blood pressure, pulse, and oxygen                        saturations were monitored continuously. The                        Colonoscope was introduced through the anus and  advanced to the the cecum, identified by appendiceal                        orifice and ileocecal valve. The colonoscopy was                        performed without difficulty. The patient tolerated the                        procedure well. The quality of the bowel preparation                        was evaluated using the BBPS [facility] Bowel Preparation                        Scale) with scores of: Right Colon = 3, Transverse                        Colon = 3 and Left Colon = 3 (entire mucosa seen well                        with no residual staining, small fragments of stool or                        opaque  liquid). The total BBPS score equals 9. Findings:      The perianal and digital rectal examinations were normal. Pertinent       negatives include normal sphincter tone and no palpable rectal lesions.      A diminutive polyp was found in the cecum. The polyp was sessile. The       polyp was removed with a cold biopsy forceps. Resection and retrieval       were complete.      A 7 mm polyp was found in the ascending colon. The polyp was sessile.       The polyp was removed with a hot snare. Resection and retrieval were       complete.      Three sessile polyps were found in the descending colon. The polyps were       4 to 5 mm in size. These polyps were removed with a cold snare.       Resection and retrieval were complete.      Non-bleeding internal hemorrhoids were found during retroflexion. The       hemorrhoids were large.      A single large-mouthed diverticulum was found in the ascending colon. Impression:           - One diminutive polyp in the cecum, removed with a                        cold biopsy forceps. Resected and retrieved.                       - One 7 mm polyp in the ascending colon, removed with a                        hot snare. Resected and retrieved.                       - Three  4 to 5 mm polyps in the descending colon,                        removed with a cold snare. Resected and retrieved.                       - Non-bleeding internal hemorrhoids.                       - Diverticulosis in the ascending colon. Recommendation:       - Discharge patient to home (with escort).                       - Diabetic (ADA) diet, low fat diet and low sodium diet                        today.                       - Continue present medications.                       - Resume Xarelto (rivaroxaban) at prior dose today.                       - Await pathology results.                       - Repeat colonoscopy in 3 years for surveillance of                        multiple  polyps.                       - Return to my office as previously scheduled. Procedure Code(s):    --- Professional ---                       [phone], Colonoscopy, flexible; with removal of tumor(s),                        polyp(s), or other lesion(s) by snare technique                       [id number], 54, Colonoscopy, flexible; with biopsy, single                        or multiple Diagnosis Code(s):    --- Professional ---                       Z12.11, Encounter for screening for malignant neoplasm                        of colon                       D12.0, Benign neoplasm of cecum                       D12.2, Benign neoplasm of ascending colon                       D12.4, Benign neoplasm of descending colon  K64.8, Other hemorrhoids                       K57.30, Diverticulosis of large intestine without                        perforation or abscess without bleeding CPT copyright 2017 American Medical Association. All rights reserved. The codes documented in this report are preliminary and upon coder review may  be revised to meet current compliance requirements. Dr. Ulyess Mort Lin Landsman MD, MD 02/10/2018 11:05:23 AM This report has been signed electronically. Number of Addenda: 0 Note Initiated On: 02/10/2018 10:04 AM Scope Withdrawal Time: 0 hours 20 minutes 42 seconds  Total Procedure Duration: 0 hours 23 minutes 14 seconds       [facility]

## 2018-02-10 NOTE — Op Note (Signed)
[facility] Gastroenterology Patient Name: [name] Procedure Date: 02/10/2018 10:05 AM MRN: [id number] Account #: [id number] Date of Birth: May 30, 1971 Admit Type: Outpatient Age: 47 Room: [facility] ENDO ROOM 4 Gender: Male Note Status: Finalized Procedure:            Upper GI endoscopy Indications:          Epigastric abdominal pain, Dyspepsia Providers:            Lin Landsman MD, MD Referring MD:         Tracie Harrier, MD (Referring MD) Medicines:            Monitored Anesthesia Care Complications:        No immediate complications. Estimated blood loss:                        Minimal. Procedure:            Pre-Anesthesia Assessment:                       - Prior to the procedure, a History and Physical was                        performed, and patient medications and allergies were                        reviewed. The patient is competent. The risks and                        benefits of the procedure and the sedation options and                        risks were discussed with the patient. All questions                        were answered and informed consent was obtained.                        Patient identification and proposed procedure were                        verified by the physician, the nurse, the                        anesthesiologist, the anesthetist and the technician in                        the pre-procedure area in the procedure room in the                        endoscopy suite. Mental Status Examination: alert and                        oriented. Airway Examination: normal oropharyngeal                        airway and neck mobility. Respiratory Examination:                        clear to auscultation. CV Examination: normal.  Prophylactic Antibiotics: The patient does not require                        prophylactic antibiotics. Prior Anticoagulants: The                        patient has taken Xarelto  (rivaroxaban), last dose was                        5 days prior to procedure. ASA Grade Assessment: III -                        A patient with severe systemic disease. After reviewing                        the risks and benefits, the patient was deemed in                        satisfactory condition to undergo the procedure. The                        anesthesia plan was to use monitored anesthesia care                        (MAC). Immediately prior to administration of                        medications, the patient was re-assessed for adequacy                        to receive sedatives. The heart rate, respiratory rate,                        oxygen saturations, blood pressure, adequacy of                        pulmonary ventilation, and response to care were                        monitored throughout the procedure. The physical status                        of the patient was re-assessed after the procedure.                       After obtaining informed consent, the endoscope was                        passed under direct vision. Throughout the procedure,                        the patient's blood pressure, pulse, and oxygen                        saturations were monitored continuously. The Endoscope                        was introduced through the mouth, and advanced to the  second part of duodenum. The upper GI endoscopy was                        accomplished without difficulty. The patient tolerated                        the procedure well. Findings:      The duodenal bulb and second portion of the duodenum were normal.      A 5 mm healed ulcer was found in the gastric antrum. The scar tissue was       healthy in appearance.      Diffuse mildly erythematous mucosa without bleeding was found on the       greater curvature of the gastric body. Biopsies were taken with a cold       forceps for Helicobacter pylori testing.      Two tongues of  salmon-colored mucosa were present at the GE junction. No       other visible abnormalities were present. The maximum longitudinal       extent of these esophageal mucosal changes was 0.5 cm in length.       Biopsies were taken with a cold forceps for histology.      The examined esophagus was normal.      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 Impression:           - Normal duodenal bulb and second portion of the                        duodenum.                       - Scar in the gastric antrum.                       - Erythematous mucosa in the greater curvature of the                        gastric body. Biopsied.                       - Salmon-colored mucosa suspicious for Barrett's                        esophagus. Biopsied.                       - Normal esophagus.                       - Ectopic gastric mucosa in the upper third of the                        esophagus. Recommendation:       - Await pathology results.                       - Use a proton pump inhibitor PO BID                       - Proceed with colonoscopy as scheduled                       -  See colonoscopy report Procedure Code(s):    --- Professional ---                       [phone], Esophagogastroduodenoscopy, flexible, transoral;                        with biopsy, single or multiple Diagnosis Code(s):    --- Professional ---                       K22.8, Other specified diseases of esophagus                       Q40.2, Other specified congenital malformations of                        stomach                       R10.13, Epigastric pain                       K31.89, Other diseases of stomach and duodenum CPT copyright 2017 American Medical Association. All rights reserved. The codes documented in this report are preliminary and upon coder review may  be revised to meet current compliance requirements. Dr. Ulyess Mort Lin Landsman MD,  MD 02/10/2018 10:35:35 AM This report has been signed electronically. Number of Addenda: 0 Note Initiated On: 02/10/2018 10:05 AM      [facility]

## 2018-02-10 NOTE — H&P (Signed)
Cephas Darby, MD [address]  [place]  [place], [place] [address]  Main: [phone]  Fax: [phone] Pager: [phone]  Primary Care Physician:  Tracie Harrier, MD Primary Gastroenterologist:  Dr. Cephas Darby  Pre-Procedure History & Physical: HPI:  [name] is a 47 y.o. male is here for an endoscopy and colonoscopy.   Past Medical History:  Diagnosis Date  . ARF (acute renal failure) ([place])   . Cellulitis of right lower leg 03/31/2017  . Chronic anticoagulation 03/31/2017  . Diabetes mellitus without complication ([place])   . Diabetic ulcer of calf ([place]) 03/31/2017  . DVT of proximal lower limb ([place]) 03/20/2017   R calf area  . Gout   . History of hiatal hernia   . Hypertension   . Hypoalbuminemia 03/31/2017  . Necrotizing fasciitis ([place])   . Right leg DVT ([place]) 03/31/2017  . Sinus tachycardia 03/31/2017  . Type 2 diabetes mellitus with stage 2 chronic kidney disease, with long-term current use of insulin ([place]) 03/27/2017    Past Surgical History:  Procedure Laterality Date  . I&D EXTREMITY Right 04/03/2017   Procedure: IRRIGATION AND DEBRIDEMENT RIGHT LEG, APPLY WOUND VAC;  Surgeon: Newt Minion, MD;  Location: [place];  Service: Orthopedics;  Laterality: Right;  . I&D EXTREMITY Right 04/05/2017   Procedure: IRRIGATION AND DEBRIDEMENT RIGHT LEG;  Surgeon: Newt Minion, MD;  Location: [place];  Service: Orthopedics;  Laterality: Right;  . SCROTAL SURGERY  2006  . SKIN SPLIT GRAFT Right 04/10/2017   Procedure: SKIN GRAFT SPLIT THICKNESS RIGHT LEG;  Surgeon: Newt Minion, MD;  Location: [place];  Service: Orthopedics;  Laterality: Right;    Prior to Admission medications   Medication Sig Start Date End Date Taking? Authorizing Provider  ACCU-CHEK FASTCLIX LANCETS MISC U UTD TO CHECK BLOOD SUGAR TID 01/13/18   [provider]  ACCU-CHEK GUIDE test strip USE TO TEST QID UTD 12/13/17   [provider]  Amino Acids-Protein Hydrolys (FEEDING  SUPPLEMENT, PRO-STAT SUGAR FREE 64,) LIQD Take 30 mLs by mouth 3 (three) times daily between meals. 04/12/17   Hosie Poisson, MD  amLODipine (NORVASC) 10 MG tablet Take 10 mg by mouth daily.    [provider]  cyclobenzaprine (FLEXERIL) 5 MG tablet Take 1 tablet (5 mg total) by mouth 3 (three) times daily as needed for muscle spasms. 04/12/17   Hosie Poisson, MD  ferrous sulfate 325 (65 FE) MG EC tablet Take 325 mg by mouth 3 (three) times daily with meals.    [provider]  glucose blood (ACCU-CHEK COMPACT PLUS) test strip Use 4 (four) times daily Use as instructed. 11/13/17   [provider]  hydrALAZINE (APRESOLINE) 50 MG tablet Take 50 mg by mouth 3 (three) times daily.    [provider]  insulin aspart (NOVOLOG FLEXPEN) 100 UNIT/ML FlexPen Inject into the skin. Sliding scale 121-150 2 units; 151-200 3 units; 201-250 5 units; 251-300 8 units; 301-350 11 units; 351-400 15 units, grater than 400 - 15 units, call doctor.    [provider]  Lancets Misc. (UNISTIK 2 NORMAL) MISC Use 1 each 3 (three) times daily Use as instructed. 09/23/17 09/23/18  [provider]  lisinopril (PRINIVIL,ZESTRIL) 10 MG tablet Take 10 mg by mouth daily.    [provider]  metoprolol tartrate (LOPRESSOR) 100 MG tablet Take 1 tablet (100 mg total) by mouth 2 (two) times daily. 04/12/17   Hosie Poisson, MD  rivaroxaban Alveda Reasons) 20  MG TABS tablet Take 20 mg by mouth daily with supper.    [provider]    Allergies as of 01/30/2018  . (No Known Allergies)    Family History  Problem Relation Age of Onset  . Hypertension Mother   . Stroke Mother   . Diabetes Father   . Renal Disease Father   . Hypertension Sister   . Diabetes Sister   . Renal Disease Sister   . Iron deficiency Sister     Social History   Socioeconomic History  . Marital status: Single    Spouse name: Not on file  . Number of children: Not on file  . Years of education:  Not on file  . Highest education level: Not on file  Occupational History  . Not on file  Social Needs  . Financial resource strain: Not on file  . Food insecurity:    Worry: Not on file    Inability: Not on file  . Transportation needs:    Medical: Not on file    Non-medical: Not on file  Tobacco Use  . Smoking status: Never Smoker  . Smokeless tobacco: Never Used  Substance and Sexual Activity  . Alcohol use: No  . Drug use: No  . Sexual activity: Never  Lifestyle  . Physical activity:    Days per week: Not on file    Minutes per session: Not on file  . Stress: Not on file  Relationships  . Social connections:    Talks on phone: Not on file    Gets together: Not on file    Attends religious service: Not on file    Active member of club or organization: Not on file    Attends meetings of clubs or organizations: Not on file    Relationship status: Not on file  . Intimate partner violence:    Fear of current or ex partner: Not on file    Emotionally abused: Not on file    Physically abused: Not on file    Forced sexual activity: Not on file  Other Topics Concern  . Not on file  Social History Narrative  . Not on file    Review of Systems: See HPI, otherwise negative ROS  Physical Exam: BP (!) 144/98   Pulse 74   Temp (!) 96.6 F (35.9 C) (Tympanic)   Resp 20   Ht 5' 11.5" (1.816 m)   Wt 259 lb (117.5 kg)   SpO2 100%   BMI 35.62 kg/m  General:   Alert,  pleasant and cooperative in NAD Head:  Normocephalic and atraumatic. Neck:  Supple; no masses or thyromegaly. Lungs:  Clear throughout to auscultation.    Heart:  Regular rate and rhythm. Abdomen:  Soft, nontender and nondistended. Normal bowel sounds, without guarding, and without rebound.   Neurologic:  Alert and  oriented x4;  grossly normal neurologically.  Impression/Plan: [name] is here for an endoscopy and colonoscopy to be performed for chronic epigastric pain and colon cancer  screening  Risks, benefits, limitations, and alternatives regarding  endoscopy and colonoscopy have been reviewed with the patient.  Questions have been answered.  All parties agreeable.   Sherri Sear, MD  02/10/2018, 10:03 AM

## 2018-02-10 NOTE — Anesthesia Post-op Follow-up Note (Signed)
Anesthesia QCDR form completed.        

## 2018-02-11 NOTE — Anesthesia Postprocedure Evaluation (Signed)
Anesthesia Post Note  Patient: JAYZEN PAVER  Procedure(s) Performed: COLONOSCOPY WITH PROPOFOL (N/A ) ESOPHAGOGASTRODUODENOSCOPY (EGD) WITH PROPOFOL (N/A )  Patient location during evaluation: Endoscopy Anesthesia Type: General Level of consciousness: awake and alert Pain management: pain level controlled Vital Signs Assessment: post-procedure vital signs reviewed and stable Respiratory status: spontaneous breathing, nonlabored ventilation, respiratory function stable and patient connected to nasal cannula oxygen Cardiovascular status: blood pressure returned to baseline and stable Postop Assessment: no apparent nausea or vomiting Anesthetic complications: no     Last Vitals:  Vitals:   02/10/18 1143 02/10/18 1156  BP: 106/73 139/85  Pulse:    Resp:    Temp:    SpO2:      Last Pain:  Vitals:   02/10/18 1156  TempSrc:   PainSc: 0-No pain                 Precious Haws Kamori Barbier

## 2018-02-13 LAB — SURGICAL PATHOLOGY

## 2018-02-14 ENCOUNTER — Other Ambulatory Visit: Payer: Self-pay

## 2018-02-14 ENCOUNTER — Encounter: Payer: Self-pay | Admitting: Gastroenterology

## 2018-02-14 DIAGNOSIS — A048 Other specified bacterial intestinal infections: Secondary | ICD-10-CM

## 2018-02-14 MED ORDER — CLARITHROMYCIN 250 MG PO TABS: 250.0000 mg | ORAL_TABLET | Freq: Two times a day (BID) | ORAL | 0 refills | Status: AC

## 2018-02-14 MED ORDER — OMEPRAZOLE 40 MG PO CPDR: 40.0000 mg | DELAYED_RELEASE_CAPSULE | Freq: Two times a day (BID) | ORAL | 0 refills | Status: DC

## 2018-02-14 MED ORDER — AMOXICILLIN 500 MG PO TABS: 500.0000 mg | ORAL_TABLET | Freq: Two times a day (BID) | ORAL | 0 refills | Status: AC

## 2018-04-01 ENCOUNTER — Ambulatory Visit: Payer: BLUE CROSS/BLUE SHIELD | Admitting: Gastroenterology

## 2018-05-13 ENCOUNTER — Other Ambulatory Visit: Payer: Self-pay

## 2018-05-13 ENCOUNTER — Emergency Department
Admission: EM | Admit: 2018-05-13 | Discharge: 2018-05-13 | Disposition: A | Discharge status: Home / Self Care | Payer: BLUE CROSS/BLUE SHIELD | Attending: Emergency Medicine | Admitting: Emergency Medicine

## 2018-05-13 ENCOUNTER — Ambulatory Visit (INDEPENDENT_AMBULATORY_CARE_PROVIDER_SITE_OTHER): Payer: BLUE CROSS/BLUE SHIELD | Admitting: Gastroenterology

## 2018-05-13 ENCOUNTER — Encounter: Payer: Self-pay | Admitting: Gastroenterology

## 2018-05-13 VITALS — BP 157/96 | HR 60 | Resp 16 | Ht 71.0 in | Wt 261.4 lb

## 2018-05-13 DIAGNOSIS — I129 Hypertensive chronic kidney disease with stage 1 through stage 4 chronic kidney disease, or unspecified chronic kidney disease: Secondary | ICD-10-CM | POA: Insufficient documentation

## 2018-05-13 DIAGNOSIS — Z794 Long term (current) use of insulin: Secondary | ICD-10-CM | POA: Diagnosis not present

## 2018-05-13 DIAGNOSIS — R109 Unspecified abdominal pain: Secondary | ICD-10-CM

## 2018-05-13 DIAGNOSIS — Z79899 Other long term (current) drug therapy: Secondary | ICD-10-CM | POA: Insufficient documentation

## 2018-05-13 DIAGNOSIS — R1013 Epigastric pain: Secondary | ICD-10-CM | POA: Insufficient documentation

## 2018-05-13 DIAGNOSIS — E1122 Type 2 diabetes mellitus with diabetic chronic kidney disease: Secondary | ICD-10-CM | POA: Diagnosis not present

## 2018-05-13 DIAGNOSIS — Z7901 Long term (current) use of anticoagulants: Secondary | ICD-10-CM | POA: Diagnosis not present

## 2018-05-13 DIAGNOSIS — G8929 Other chronic pain: Secondary | ICD-10-CM | POA: Diagnosis not present

## 2018-05-13 DIAGNOSIS — N182 Chronic kidney disease, stage 2 (mild): Secondary | ICD-10-CM | POA: Insufficient documentation

## 2018-05-13 DIAGNOSIS — A048 Other specified bacterial intestinal infections: Secondary | ICD-10-CM

## 2018-05-13 LAB — COMPREHENSIVE METABOLIC PANEL
ALT: 20 U/L (ref 0–44)
AST: 20 U/L (ref 15–41)
Albumin: 4 g/dL (ref 3.5–5.0)
Alkaline Phosphatase: 71 U/L (ref 38–126)
Anion gap: 9 (ref 5–15)
BUN: 23 mg/dL — ABNORMAL HIGH (ref 6–20)
CO2: 28 mmol/L (ref 22–32)
Calcium: 8.9 mg/dL (ref 8.9–10.3)
Chloride: 105 mmol/L (ref 98–111)
Creatinine, Ser: 1.53 mg/dL — ABNORMAL HIGH (ref 0.61–1.24)
GFR calc Af Amer: >60 mL/min (ref >60)
GFR calc non Af Amer: 53 mL/min — ABNORMAL LOW (ref >60)
Glucose, Bld: 109 mg/dL — ABNORMAL HIGH (ref 70–99)
Potassium: 4.1 mmol/L (ref 3.5–5.1)
Sodium: 142 mmol/L (ref 135–145)
Total Bilirubin: 0.7 mg/dL (ref 0.3–1.2)
Total Protein: 8 g/dL (ref 6.5–8.1)

## 2018-05-13 LAB — CBC
HCT: 46.1 % (ref 39.0–52.0)
Hemoglobin: 14.9 g/dL (ref 13.0–17.0)
MCH: 29.1 pg (ref 26.0–34.0)
MCHC: 32.3 g/dL (ref 30.0–36.0)
MCV: 90 fL (ref 80.0–100.0)
Platelets: 267 10*3/uL (ref 150–400)
RBC: 5.12 MIL/uL (ref 4.22–5.81)
RDW: 12.9 % (ref 11.5–15.5)
WBC: 5.3 10*3/uL (ref 4.0–10.5)
nRBC: 0 % (ref 0.0–0.2)

## 2018-05-13 LAB — LIPASE, BLOOD: Lipase: 26 U/L (ref 11–51)

## 2018-05-13 MED ORDER — MISOPROSTOL 200 MCG PO TABS
200.0000 ug | ORAL_TABLET | Freq: Once | ORAL | Status: AC
  Administered 2018-05-13: 200 ug via ORAL
  Filled 2018-05-13: qty 1

## 2018-05-13 MED ORDER — ALUM & MAG HYDROXIDE-SIMETH 200-200-20 MG/5ML PO SUSP
30.0000 mL | Freq: Once | ORAL | Status: AC
  Administered 2018-05-13: 30 mL via ORAL
  Filled 2018-05-13: qty 30

## 2018-05-13 MED ORDER — OLANZAPINE 5 MG PO TABS
5.0000 mg | ORAL_TABLET | Freq: Once | ORAL | Status: AC
  Administered 2018-05-13: 5 mg via ORAL
  Filled 2018-05-13: qty 1

## 2018-05-13 NOTE — ED Triage Notes (Signed)
Pt in via EMS from work with c/o pain abdominal area that starts mid epigastric and goes down. Pt reports has this same issue intermittently since 2011 and usually has to see his GI MD. Pt reports does have an appt with his GI MD tomorrow am. FSBS 102, BP 166/98,

## 2018-05-13 NOTE — Progress Notes (Signed)
[name] Darby, MD [address]  [place]  [place], [place] [address]  Main: [phone]  Fax: [phone]    Gastroenterology Consultation  Referring Provider:     Tracie Harrier, MD Primary Care Physician:  Tracie Harrier, MD Primary Gastroenterologist:  Dr. Cephas Price Reason for Consultation: H. pylori gastritis        HPI:   [name] is a 47 y.o. African-American male referred by Dr. Tracie Harrier, MD  for consultation & management of chronic epigastric pain.patient is to metabolic syndrome, reports that he has been suffering from intermittent episodes of sudden onset of epigastric pain since 2011. He has been diabetic prior to 2011, his initial A1c was 15.5, currently on insulin. He reports episodes of upper abdominal pain to be sudden onset, sharp, worse with standing and feels better by lying down. These episodes occur once every few weeks to 6 months or so. Most recent episode was last week when he went to [facility]. His basic labs were unremarkable, ultrasound revealed fatty liver, CT A/P revealed left inguinal hernia. He was discharged on Bentyl and Pepcid. He said he could not afford Bentyl. He did not try Pepcid. He denies any relation to food, not associated with nausea, vomiting, bloating, early satiety. He denies weight loss. Over the years, his A1c has improved but still high. Most recent value is 9.7. He denies smoking or drinking alcohol. He denies any lower GI symptoms   Follow-up visit 05/04/2018 He underwent EGD and colonoscopy.  EGD confirmed H. pylori gastritis status post triple therapy for 2 weeks.  Patient reports that epigastric pain has significantly improved.  Currently, pain is sporadic and mild.  His diabetes is significantly improved, most recent hemoglobin A1c is 7.6 in 02/2018.  He reports that he is watching his diet carefully.  He denies any other GI symptoms.  NSAIDs:  none  Antiplts/Anticoagulants/Anti thrombotics: on rivaroxaban with history of chronic DVT since 03/2017. Patient reports that the repeat ultrasound Doppler was inconclusive, therefore he decided to stay on the medication, however he is taking only twice weekly.  GI Procedures:  EGD 02/10/2018 - Normal duodenal bulb and second portion of the duodenum. - Scar in the gastric antrum. - Erythematous mucosa in the greater curvature of the gastric body. Biopsied. - Salmon-colored mucosa suspicious for Barrett's esophagus. Biopsied. - Normal esophagus. - Ectopic gastric mucosa in the upper third of the esophagus.  Colonoscopy 02/10/2018 - One diminutive polyp in the cecum, removed with a cold biopsy forceps. Resected and retrieved. - One 7 mm polyp in the ascending colon, removed with a hot snare. Resected and retrieved. - Three 4 to 5 mm polyps in the descending colon, removed with a cold snare. Resected and retrieved. - Non-bleeding internal hemorrhoids. - Diverticulosis in the ascending colon. He denies family history of GI malignancy  DIAGNOSIS:  A. STOMACH; COLD BIOPSY:  - ANTRAL AND OXYNTIC MUCOSA WITH PATCHY MILD CHRONIC ACTIVE GASTRITIS  WITH RARE HELICOBACTER ORGANISMS IDENTIFIED ON IHC STAIN.  - NEGATIVE FOR DYSPLASIA AND MALIGNANCY.   B. GEJ; COLD BIOPSY:  - PREDOMINANTLY SQUAMOUS MUCOSA WITH FOCAL GASTRIC CARDIAC GLANDULAR  MUCOSA.  - NEGATIVE FOR GOBLET CELLS, DYSPLASIA, AND MALIGNANCY.   C. COLON POLYP, CECUM; COLD BIOPSY:  - COLONIC MUCOSA WITH PROMINENT LYMPHOID AGGREGATE.  - NEGATIVE FOR DYSPLASIA AND MALIGNANCY.   D. COLON POLYP, ASCENDING; HOT SNARE:  - SESSILE SERRATED ADENOMA.  - NEGATIVE FOR HIGH-GRADE DYSPLASIA AND MALIGNANCY.  E. COLON POLYPS X3, DESCENDING; COLD SNARE AND COLD BIOPSY:  - SESSILE SERRATED ADENOMA (2 FRAGMENTS).  - TUBULAR ADENOMA (2 FRAGMENTS).  - NEGATIVE FOR HIGH-GRADE DYSPLASIA AND MALIGNANCY.   Past Medical History:  Diagnosis Date   . ARF (acute renal failure) ([place])   . Cellulitis of right lower leg 03/31/2017  . Chronic anticoagulation 03/31/2017  . Diabetes mellitus without complication ([place])   . Diabetic ulcer of calf ([place]) 03/31/2017  . DVT of proximal lower limb ([place]) 03/20/2017   R calf area  . Gout   . History of hiatal hernia   . Hypertension   . Hypoalbuminemia 03/31/2017  . Necrotizing fasciitis ([place])   . Right leg DVT ([place]) 03/31/2017  . Sinus tachycardia 03/31/2017  . Type 2 diabetes mellitus with stage 2 chronic kidney disease, with long-term current use of insulin ([place]) 03/27/2017    Past Surgical History:  Procedure Laterality Date  . COLONOSCOPY WITH PROPOFOL N/A 02/10/2018   Procedure: COLONOSCOPY WITH PROPOFOL;  Surgeon: Lin Landsman, MD;  Location: [facility] ENDOSCOPY;  Service: Gastroenterology;  Laterality: N/A;  . ESOPHAGOGASTRODUODENOSCOPY (EGD) WITH PROPOFOL N/A 02/10/2018   Procedure: ESOPHAGOGASTRODUODENOSCOPY (EGD) WITH PROPOFOL;  Surgeon: Lin Landsman, MD;  Location: [facility] ENDOSCOPY;  Service: Gastroenterology;  Laterality: N/A;  . I&D EXTREMITY Right 04/03/2017   Procedure: IRRIGATION AND DEBRIDEMENT RIGHT LEG, APPLY WOUND VAC;  Surgeon: Newt Minion, MD;  Location: [facility];  Service: Orthopedics;  Laterality: Right;  . I&D EXTREMITY Right 04/05/2017   Procedure: IRRIGATION AND DEBRIDEMENT RIGHT LEG;  Surgeon: Newt Minion, MD;  Location: [place];  Service: Orthopedics;  Laterality: Right;  . SCROTAL SURGERY  2006  . SKIN SPLIT GRAFT Right 04/10/2017   Procedure: SKIN GRAFT SPLIT THICKNESS RIGHT LEG;  Surgeon: Newt Minion, MD;  Location: [place];  Service: Orthopedics;  Laterality: Right;    Current Outpatient Medications:  .  ACCU-CHEK FASTCLIX LANCETS MISC, U UTD TO CHECK BLOOD SUGAR TID, Disp: , Rfl: 4 .  ACCU-CHEK GUIDE test strip, USE TO TEST QID UTD, Disp: , Rfl: 1 .  Amino Acids-Protein Hydrolys (FEEDING SUPPLEMENT, PRO-STAT SUGAR FREE 64,) LIQD, Take 30 mLs by mouth 3  (three) times daily between meals., Disp: 900 mL, Rfl: 0 .  amLODipine (NORVASC) 10 MG tablet, Take 10 mg by mouth daily., Disp: , Rfl:  .  cyclobenzaprine (FLEXERIL) 5 MG tablet, Take 1 tablet (5 mg total) by mouth 3 (three) times daily as needed for muscle spasms., Disp: 30 tablet, Rfl: 0 .  ferrous sulfate 325 (65 FE) MG EC tablet, Take 325 mg by mouth 3 (three) times daily with meals., Disp: , Rfl:  .  glucose blood (ACCU-CHEK COMPACT PLUS) test strip, Use 4 (four) times daily Use as instructed., Disp: , Rfl:  .  hydrALAZINE (APRESOLINE) 50 MG tablet, Take 50 mg by mouth 3 (three) times daily., Disp: , Rfl:  .  hydrochlorothiazide (HYDRODIURIL) 25 MG tablet, , Disp: , Rfl: 6 .  HYDROcodone-acetaminophen (NORCO/VICODIN) 5-325 MG tablet, TK 1 T PO QD PRF PAIN, Disp: , Rfl: 0 .  insulin aspart (NOVOLOG FLEXPEN) 100 UNIT/ML FlexPen, Inject into the skin. Sliding scale 121-150 2 units; 151-200 3 units; 201-250 5 units; 251-300 8 units; 301-350 11 units; 351-400 15 units, grater than 400 - 15 units, call doctor., Disp: , Rfl:  .  Lancets Misc. (UNISTIK 2 NORMAL) MISC, Use 1 each 3 (three) times daily Use as instructed., Disp: , Rfl:  .  lisinopril (PRINIVIL,ZESTRIL)  40 MG tablet, , Disp: , Rfl: 2 .  metoprolol tartrate (LOPRESSOR) 100 MG tablet, Take 1 tablet (100 mg total) by mouth 2 (two) times daily., Disp: , Rfl:  .  rivaroxaban (XARELTO) 20 MG TABS tablet, Take 20 mg by mouth daily with supper., Disp: , Rfl:  .  lisinopril (PRINIVIL,ZESTRIL) 10 MG tablet, Take 10 mg by mouth daily., Disp: , Rfl:  .  lisinopril (PRINIVIL,ZESTRIL) 20 MG tablet, Take by mouth., Disp: , Rfl:  .  omeprazole (PRILOSEC) 40 MG capsule, Take 1 capsule (40 mg total) by mouth 2 (two) times daily for 14 days., Disp: 28 capsule, Rfl: 0    Family History  Problem Relation Age of Onset  . Hypertension Mother   . Stroke Mother   . Diabetes Father   . Renal Disease Father   . Hypertension Sister   . Diabetes Sister   .  Renal Disease Sister   . Iron deficiency Sister      Social History   Tobacco Use  . Smoking status: Never Smoker  . Smokeless tobacco: Never Used  Substance Use Topics  . Alcohol use: No  . Drug use: No    Allergies as of 05/13/2018  . (No Known Allergies)    Review of Systems:    All systems reviewed and negative except where noted in HPI.   Physical Exam:  BP (!) 157/96 (BP Location: Left Arm, Patient Position: Sitting, Cuff Size: Large)   Pulse 60   Resp 16   Ht 5\' 11"  (1.803 m)   Wt 261 lb 6.4 oz (118.6 kg)   BMI 36.46 kg/m  No LMP for male patient.  General:   Alert,  Well-developed, well-nourished, pleasant and cooperative in NAD Head:  Normocephalic and atraumatic. Eyes:  Sclera clear, no icterus.   Conjunctiva pink. Ears:  Normal auditory acuity. Nose:  No deformity, discharge, or lesions. Mouth:  No deformity or lesions,oropharynx pink & moist. Neck:  Supple; no masses or thyromegaly. Lungs:  Respirations even and unlabored.  Clear throughout to auscultation.   No wheezes, crackles, or rhonchi. No acute distress. Heart:  Regular rate and rhythm; no murmurs, clicks, rubs, or gallops. Abdomen:  Normal bowel sounds. Soft,Obese, non-tender and non-distended without masses, hepatosplenomegaly, left inguinal hernia, nontender.  No guarding or rebound tenderness.   Rectal: Not performed Msk:  Symmetrical without gross deformities. Good, equal movement & strength bilaterally. Pulses:  Normal pulses noted. Extremities:  No clubbing, 1+ edema.  No cyanosis. Neurologic:  Alert and oriented x3;  grossly normal neurologically. Skin:  Intact without significant lesions or rashes. No jaundice. Lymph Nodes:  No significant cervical adenopathy. Psych:  Alert and cooperative. Normal mood and affect.  Imaging Studies: reviewed  Assessment and Plan:   KELSEN CELONA is a 47 y.o. African-American male with metabolic syndrome, poorly controlled diabetes mellitus presents  with chronic intermittent episodes of epigastric pain without any other constitutional symptoms. Most recent evaluation including CBC, LFTs, lipase, ultrasound and CT A/P were fairly unremarkable except for evidence of left inguinal hernia.    Chronic epigastric pain: EGD revealed H. pylori gastritis, status post triple therapy  Perform H. pylori breath test to confirm eradication, patient is currently off PPI Will contact patient if H. pylori breath test results come back positive for repeat treatment of H. pylori  Tubular adenomas of colon and sessile serrated adenoma: Recommend colonoscopy in 3 years  Follow up based on H. pylori results   [name] Darby, MD

## 2018-05-13 NOTE — ED Provider Notes (Signed)
[facility] Emergency Department Provider Note  ____________________________________________   First MD Initiated Contact with Patient 05/13/18 [phone]     (approximate)  I have reviewed the triage vital signs and the nursing notes.   HISTORY  Chief Complaint Abdominal Pain   HPI [name] is a 47 y.o. male who comes to the emergency department from his job working third shift with epigastric abdominal pain.  He is had a long-standing history of abdominal pain since 2011 and is followed by Dr. Marius Ditch of gastroenterology.  He has an appointment with her in 3 hours however his pain became severe tonight acutely which prompted the visit to the emergency department for relief prior to his appointment.  Pain is epigastric burning postprandial associated with nausea but no vomiting.  No diarrhea.   No history of abdominal surgeries although he has had an endoscopy showing H. pylori gastritis in the past.  He is currently compliant with his PPI.  Symptoms are intermittent severe and nothing in particular seems to make them better when his symptoms are so severe.  Clearly worsened by spicy or fried food.   Past Medical History:  Diagnosis Date  . ARF (acute renal failure) ([place])   . Cellulitis of right lower leg 03/31/2017  . Chronic anticoagulation 03/31/2017  . Diabetes mellitus without complication ([place])   . Diabetic ulcer of calf ([place]) 03/31/2017  . DVT of proximal lower limb ([place]) 03/20/2017   R calf area  . Gout   . History of hiatal hernia   . Hypertension   . Hypoalbuminemia 03/31/2017  . Necrotizing fasciitis ([place])   . Right leg DVT ([place]) 03/31/2017  . Sinus tachycardia 03/31/2017  . Type 2 diabetes mellitus with stage 2 chronic kidney disease, with long-term current use of insulin ([place]) 03/27/2017    Patient Active Problem List   Diagnosis Date Noted  . Elevated blood pressure reading in office with diagnosis of hypertension 12/17/2017  . Ulcer of  right lower extremity with fat layer exposed ([place]) 12/17/2017  . S/P split thickness skin graft 06/28/2017  . Escherichia coli sepsis ([place]) 04/18/2017  . Acute blood loss as cause of postoperative anemia 04/18/2017  . ARF (acute renal failure) ([place])   . Diabetic ulcer of ankle ([place])   . Necrotizing fasciitis of lower leg ([place])   . Hyperglycemia due to type 2 diabetes mellitus ([place]) 03/31/2017  . Acute kidney injury superimposed on chronic kidney disease ([place]) 03/31/2017  . Hypoalbuminemia 03/31/2017  . Right leg DVT ([place]) 03/31/2017  . Diabetic ulcer of calf ([place]) 03/31/2017  . Noncompliance w/medication treatment due to intermit use of medication 03/31/2017  . Sinus tachycardia 03/31/2017  . Fever 03/31/2017  . Chronic anticoagulation 03/31/2017  . Sepsis ([place]) 03/30/2017  . Type 2 diabetes mellitus with stage 2 chronic kidney disease, with long-term current use of insulin ([place]) 03/27/2017  . History of DVT (deep vein thrombosis) 03/27/2017  . Diabetes ([place]) 03/22/2017  . Essential hypertension 01/28/2008    Past Surgical History:  Procedure Laterality Date  . COLONOSCOPY WITH PROPOFOL N/A 02/10/2018   Procedure: COLONOSCOPY WITH PROPOFOL;  Surgeon: Lin Landsman, MD;  Location: [facility] ENDOSCOPY;  Service: Gastroenterology;  Laterality: N/A;  . ESOPHAGOGASTRODUODENOSCOPY (EGD) WITH PROPOFOL N/A 02/10/2018   Procedure: ESOPHAGOGASTRODUODENOSCOPY (EGD) WITH PROPOFOL;  Surgeon: Lin Landsman, MD;  Location: [facility] ENDOSCOPY;  Service: Gastroenterology;  Laterality: N/A;  . I&D EXTREMITY Right 04/03/2017   Procedure: IRRIGATION AND DEBRIDEMENT RIGHT LEG, APPLY WOUND VAC;  Surgeon: Newt Minion, MD;  Location: [facility];  Service: Orthopedics;  Laterality: Right;  . I&D EXTREMITY Right 04/05/2017   Procedure: IRRIGATION AND DEBRIDEMENT RIGHT LEG;  Surgeon: Newt Minion, MD;  Location: [place];  Service: Orthopedics;  Laterality: Right;  . SCROTAL SURGERY  2006  . SKIN SPLIT GRAFT  Right 04/10/2017   Procedure: SKIN GRAFT SPLIT THICKNESS RIGHT LEG;  Surgeon: Newt Minion, MD;  Location: [facility];  Service: Orthopedics;  Laterality: Right;    Prior to Admission medications   Medication Sig Start Date End Date Taking? Authorizing Provider  ACCU-CHEK FASTCLIX LANCETS MISC U UTD TO CHECK BLOOD SUGAR TID 01/13/18   [provider]  ACCU-CHEK GUIDE test strip USE TO TEST QID UTD 12/13/17   [provider]  Amino Acids-Protein Hydrolys (FEEDING SUPPLEMENT, PRO-STAT SUGAR FREE 64,) LIQD Take 30 mLs by mouth 3 (three) times daily between meals. 04/12/17   Hosie Poisson, MD  amLODipine (NORVASC) 10 MG tablet Take 10 mg by mouth daily.    [provider]  cyclobenzaprine (FLEXERIL) 5 MG tablet Take 1 tablet (5 mg total) by mouth 3 (three) times daily as needed for muscle spasms. 04/12/17   Hosie Poisson, MD  ferrous sulfate 325 (65 FE) MG EC tablet Take 325 mg by mouth 3 (three) times daily with meals.    [provider]  glucose blood (ACCU-CHEK COMPACT PLUS) test strip Use 4 (four) times daily Use as instructed. 11/13/17   [provider]  hydrALAZINE (APRESOLINE) 50 MG tablet Take 50 mg by mouth 3 (three) times daily.    [provider]  hydrochlorothiazide (HYDRODIURIL) 25 MG tablet  05/08/18   [provider]  HYDROcodone-acetaminophen (NORCO/VICODIN) 5-325 MG tablet TK 1 T PO QD PRF PAIN 03/19/18   [provider]  insulin aspart (NOVOLOG FLEXPEN) 100 UNIT/ML FlexPen Inject into the skin. Sliding scale 121-150 2 units; 151-200 3 units; 201-250 5 units; 251-300 8 units; 301-350 11 units; 351-400 15 units, grater than 400 - 15 units, call doctor.    [provider]  Lancets Misc. (UNISTIK 2 NORMAL) MISC Use 1 each 3 (three) times daily Use as instructed. 09/23/17 09/23/18  [provider]  lisinopril (PRINIVIL,ZESTRIL) 10 MG tablet Take 10 mg by mouth daily.    [provider]  lisinopril  (PRINIVIL,ZESTRIL) 20 MG tablet Take by mouth. 02/27/18 02/27/19  [provider]  lisinopril (PRINIVIL,ZESTRIL) 40 MG tablet  05/09/18   [provider]  metoprolol tartrate (LOPRESSOR) 100 MG tablet Take 1 tablet (100 mg total) by mouth 2 (two) times daily. 04/12/17   Hosie Poisson, MD  omeprazole (PRILOSEC) 40 MG capsule Take 1 capsule (40 mg total) by mouth 2 (two) times daily for 14 days. 02/14/18 02/28/18  Lin Landsman, MD  rivaroxaban (XARELTO) 20 MG TABS tablet Take 20 mg by mouth daily with supper.    [provider]    Allergies Patient has no known allergies.  Family History  Problem Relation Age of Onset  . Hypertension Mother   . Stroke Mother   . Diabetes Father   . Renal Disease Father   . Hypertension Sister   . Diabetes Sister   . Renal Disease Sister   . Iron deficiency Sister     Social History Social History   Tobacco Use  . Smoking status: Never Smoker  . Smokeless tobacco: Never Used  Substance Use Topics  . Alcohol use: No  . Drug use: No  Review of Systems Constitutional: No fever/chills Eyes: No visual changes. ENT: No sore throat. Cardiovascular: Denies chest pain. Respiratory: Denies shortness of breath. Gastrointestinal: Positive for abdominal pain.  Positive for nausea, no vomiting.  No diarrhea.  No constipation. Genitourinary: Negative for dysuria. Musculoskeletal: Negative for back pain. Skin: Negative for rash. Neurological: Negative for headaches, focal weakness or numbness.   ____________________________________________   PHYSICAL EXAM:  VITAL SIGNS: ED Triage Vitals  Enc Vitals Group     BP 05/13/18 0325 (!) 146/90     Pulse Rate 05/13/18 0325 65     Resp 05/13/18 0325 18     Temp 05/13/18 0325 98.5 F (36.9 C)     Temp Source 05/13/18 0325 Oral     SpO2 05/13/18 0325 99 %     Weight 05/13/18 0323 259 lb (117.5 kg)     Height 05/13/18 0323 5\' 11"  (1.803 m)     Head Circumference --       Peak Flow --      Pain Score 05/13/18 0323 8     Pain Loc --      Pain Edu? --      Excl. in [place]? --     Constitutional: Alert and oriented x4 well-appearing nontoxic no diaphoresis speaks in full clear sentences Eyes: PERRL EOMI. Head: Atraumatic. Nose: No congestion/rhinnorhea. Mouth/Throat: No trismus Neck: No stridor.   Cardiovascular: Normal rate, regular rhythm. Grossly normal heart sounds.  Good peripheral circulation. Respiratory: Normal respiratory effort.  No retractions. Lungs CTAB and moving good air Gastrointestinal: Soft minimal epigastric tenderness no rebound or guarding no peritonitis Musculoskeletal: No lower extremity edema   Neurologic:  Normal speech and language. No gross focal neurologic deficits are appreciated. Skin:  Skin is warm, dry and intact. No rash noted. Psychiatric: Mood and affect are normal. Speech and behavior are normal.    ____________________________________________   DIFFERENTIAL includes but not limited to  Gastritis, gastric reflux, pancreatitis, cholecystitis, appendicitis, diverticulitis ____________________________________________   LABS (all labs ordered are listed, but only abnormal results are displayed)  Labs Reviewed  COMPREHENSIVE METABOLIC PANEL - Abnormal; Notable for the following components:      Result Value   Glucose, Bld 109 (*)    BUN 23 (*)    Creatinine, Ser 1.53 (*)    GFR calc non Af Amer 53 (*)    All other components within normal limits  LIPASE, BLOOD  CBC    Lab work reviewed by me with no acute disease __________________________________________  EKG   ____________________________________________  RADIOLOGY   ____________________________________________   PROCEDURES  Procedure(s) performed: no  Procedures  Critical Care performed: no  ____________________________________________   INITIAL IMPRESSION / ASSESSMENT AND PLAN / ED COURSE  Pertinent labs & imaging results that were  available during my care of the patient were reviewed by me and considered in my medical decision making (see chart for details).   As part of my medical decision making, I reviewed the following data within the [place] History obtained from family if available, nursing notes, old chart and ekg, as well as notes from prior ED visits.  The patient is somewhat uncomfortable appearing although with many years of chronic abdominal pain that is acutely worsened.  He is known H. pylori gastritis in the past.  Given symptomatic treatment with Maalox, Cytotec, Carafate, and Zyprexa with improvement in his symptoms.  He has follow-up in the less than 2 hours and I have encouraged him to keep it.  Strict return precautions have been given.      ____________________________________________   FINAL CLINICAL IMPRESSION(S) / ED DIAGNOSES  Final diagnoses:  Chronic abdominal pain      NEW MEDICATIONS STARTED DURING THIS VISIT:  Discharge Medication List as of 05/13/2018  6:20 AM       Note:  This document was prepared using Dragon voice recognition software and may include unintentional dictation errors.     Darel Hong, MD 05/15/18 2135

## 2018-05-13 NOTE — ED Triage Notes (Signed)
Patient reports abdominal pain off/on over the weekend.  Tonight pain progressively worse.

## 2018-05-13 NOTE — Discharge Instructions (Signed)
It was a pleasure to take care of you today, and thank you for coming to our emergency department.  If you have any questions or concerns before leaving please ask the nurse to grab me and I'm more than happy to go through your aftercare instructions again.  If you were prescribed any opioid pain medication today such as Norco, Vicodin, Percocet, morphine, hydrocodone, or oxycodone please make sure you do not drive when you are taking this medication as it can alter your ability to drive safely.  If you have any concerns once you are home that you are not improving or are in fact getting worse before you can make it to your follow-up appointment, please do not hesitate to call 911 and come back for further evaluation.  Darel Hong, MD  Results for orders placed or performed during the hospital encounter of 05/13/18  Lipase, blood  Result Value Ref Range   Lipase 26 11 - 51 U/L  Comprehensive metabolic panel  Result Value Ref Range   Sodium 142 135 - 145 mmol/L   Potassium 4.1 3.5 - 5.1 mmol/L   Chloride 105 98 - 111 mmol/L   CO2 28 22 - 32 mmol/L   Glucose, Bld 109 (H) 70 - 99 mg/dL   BUN 23 (H) 6 - 20 mg/dL   Creatinine, Ser 1.53 (H) 0.61 - 1.24 mg/dL   Calcium 8.9 8.9 - 10.3 mg/dL   Total Protein 8.0 6.5 - 8.1 g/dL   Albumin 4.0 3.5 - 5.0 g/dL   AST 20 15 - 41 U/L   ALT 20 0 - 44 U/L   Alkaline Phosphatase 71 38 - 126 U/L   Total Bilirubin 0.7 0.3 - 1.2 mg/dL   GFR calc non Af Amer 53 (L) >60 mL/min   GFR calc Af Amer >60 >60 mL/min   Anion gap 9 5 - 15  CBC  Result Value Ref Range   WBC 5.3 4.0 - 10.5 K/uL   RBC 5.12 4.22 - 5.81 MIL/uL   Hemoglobin 14.9 13.0 - 17.0 g/dL   HCT 46.1 39.0 - 52.0 %   MCV 90.0 80.0 - 100.0 fL   MCH 29.1 26.0 - 34.0 pg   MCHC 32.3 30.0 - 36.0 g/dL   RDW 12.9 11.5 - 15.5 %   Platelets 267 150 - 400 K/uL   nRBC 0.0 0.0 - 0.2 %

## 2018-05-14 LAB — H. PYLORI BREATH TEST: H pylori Breath Test: NEGATIVE

## 2018-05-20 ENCOUNTER — Other Ambulatory Visit: Payer: Self-pay

## 2018-05-20 ENCOUNTER — Emergency Department: Payer: BLUE CROSS/BLUE SHIELD

## 2018-05-20 ENCOUNTER — Telehealth: Payer: Self-pay | Admitting: Gastroenterology

## 2018-05-20 ENCOUNTER — Encounter: Payer: Self-pay | Admitting: Gastroenterology

## 2018-05-20 ENCOUNTER — Emergency Department
Admission: EM | Admit: 2018-05-20 | Discharge: 2018-05-20 | Disposition: A | Discharge status: Home / Self Care | Payer: BLUE CROSS/BLUE SHIELD | Attending: Emergency Medicine | Admitting: Emergency Medicine

## 2018-05-20 DIAGNOSIS — R1010 Upper abdominal pain, unspecified: Secondary | ICD-10-CM | POA: Insufficient documentation

## 2018-05-20 DIAGNOSIS — N182 Chronic kidney disease, stage 2 (mild): Secondary | ICD-10-CM | POA: Insufficient documentation

## 2018-05-20 DIAGNOSIS — Z794 Long term (current) use of insulin: Secondary | ICD-10-CM | POA: Diagnosis not present

## 2018-05-20 DIAGNOSIS — I129 Hypertensive chronic kidney disease with stage 1 through stage 4 chronic kidney disease, or unspecified chronic kidney disease: Secondary | ICD-10-CM | POA: Insufficient documentation

## 2018-05-20 DIAGNOSIS — Z7901 Long term (current) use of anticoagulants: Secondary | ICD-10-CM | POA: Insufficient documentation

## 2018-05-20 DIAGNOSIS — E1122 Type 2 diabetes mellitus with diabetic chronic kidney disease: Secondary | ICD-10-CM | POA: Diagnosis not present

## 2018-05-20 DIAGNOSIS — Z79899 Other long term (current) drug therapy: Secondary | ICD-10-CM | POA: Insufficient documentation

## 2018-05-20 DIAGNOSIS — R1013 Epigastric pain: Secondary | ICD-10-CM | POA: Diagnosis present

## 2018-05-20 NOTE — Telephone Encounter (Signed)
error 

## 2018-05-20 NOTE — ED Provider Notes (Signed)
[facility] Emergency Department Provider Note   ____________________________________________    I have reviewed the triage vital signs and the nursing notes.   HISTORY  Chief Complaint Abdominal Pain     HPI [name] is a 47 y.o. male with a history of diabetes, chronic upper abdominal pain, who presents with epigastric discomfort.  Patient reports he has had this pain for many years, typically occurs soon after eating and will usually resolve if he holds very still and lies down.  He states currently his pain has almost abated.  Has not taken anything for this.  Has seen GI recently treated successfully for Helicobacter pylori.  CT scan performed in July.  No history of an ultrasound of the gallbladder   Past Medical History:  Diagnosis Date  . ARF (acute renal failure) ([place])   . Cellulitis of right lower leg 03/31/2017  . Chronic anticoagulation 03/31/2017  . Diabetes mellitus without complication ([place])   . Diabetic ulcer of calf ([place]) 03/31/2017  . DVT of proximal lower limb ([place]) 03/20/2017   R calf area  . Gout   . History of hiatal hernia   . Hypertension   . Hypoalbuminemia 03/31/2017  . Necrotizing fasciitis ([place])   . Right leg DVT ([place]) 03/31/2017  . Sinus tachycardia 03/31/2017  . Type 2 diabetes mellitus with stage 2 chronic kidney disease, with long-term current use of insulin ([place]) 03/27/2017    Patient Active Problem List   Diagnosis Date Noted  . Elevated blood pressure reading in office with diagnosis of hypertension 12/17/2017  . Ulcer of right lower extremity with fat layer exposed ([place]) 12/17/2017  . S/P split thickness skin graft 06/28/2017  . Escherichia coli sepsis ([place]) 04/18/2017  . Acute blood loss as cause of postoperative anemia 04/18/2017  . ARF (acute renal failure) ([place])   . Diabetic ulcer of ankle ([place])   . Necrotizing fasciitis of lower leg ([place])   . Hyperglycemia due to type 2 diabetes mellitus ([place])  03/31/2017  . Acute kidney injury superimposed on chronic kidney disease ([place]) 03/31/2017  . Hypoalbuminemia 03/31/2017  . Right leg DVT ([place]) 03/31/2017  . Diabetic ulcer of calf ([place]) 03/31/2017  . Noncompliance w/medication treatment due to intermit use of medication 03/31/2017  . Sinus tachycardia 03/31/2017  . Fever 03/31/2017  . Chronic anticoagulation 03/31/2017  . Sepsis ([place]) 03/30/2017  . Type 2 diabetes mellitus with stage 2 chronic kidney disease, with long-term current use of insulin ([place]) 03/27/2017  . History of DVT (deep vein thrombosis) 03/27/2017  . Diabetes ([place]) 03/22/2017  . Essential hypertension 01/28/2008    Past Surgical History:  Procedure Laterality Date  . COLONOSCOPY WITH PROPOFOL N/A 02/10/2018   Procedure: COLONOSCOPY WITH PROPOFOL;  Price: Lin Landsman, MD;  Location: [facility] ENDOSCOPY;  Service: Gastroenterology;  Laterality: N/A;  . ESOPHAGOGASTRODUODENOSCOPY (EGD) WITH PROPOFOL N/A 02/10/2018   Procedure: ESOPHAGOGASTRODUODENOSCOPY (EGD) WITH PROPOFOL;  Price: Lin Landsman, MD;  Location: [facility] ENDOSCOPY;  Service: Gastroenterology;  Laterality: N/A;  . I&D EXTREMITY Right 04/03/2017   Procedure: IRRIGATION AND DEBRIDEMENT RIGHT LEG, APPLY WOUND VAC;  Price: Newt Minion, MD;  Location: [place];  Service: Orthopedics;  Laterality: Right;  . I&D EXTREMITY Right 04/05/2017   Procedure: IRRIGATION AND DEBRIDEMENT RIGHT LEG;  Price: Newt Minion, MD;  Location: [place];  Service: Orthopedics;  Laterality: Right;  . SCROTAL SURGERY  2006  . SKIN SPLIT GRAFT Right 04/10/2017   Procedure: SKIN GRAFT  SPLIT THICKNESS RIGHT LEG;  Price: Newt Minion, MD;  Location: [place];  Service: Orthopedics;  Laterality: Right;    Prior to Admission medications   Medication Sig Start Date End Date Taking? Authorizing Provider  ACCU-CHEK FASTCLIX LANCETS MISC U UTD TO CHECK BLOOD SUGAR TID 01/13/18   [provider]  ACCU-CHEK GUIDE test strip USE  TO TEST QID UTD 12/13/17   [provider]  Amino Acids-Protein Hydrolys (FEEDING SUPPLEMENT, PRO-STAT SUGAR FREE 64,) LIQD Take 30 mLs by mouth 3 (three) times daily between meals. 04/12/17   Hosie Poisson, MD  amLODipine (NORVASC) 10 MG tablet Take 10 mg by mouth daily.    [provider]  cyclobenzaprine (FLEXERIL) 5 MG tablet Take 1 tablet (5 mg total) by mouth 3 (three) times daily as needed for muscle spasms. 04/12/17   Hosie Poisson, MD  ferrous sulfate 325 (65 FE) MG EC tablet Take 325 mg by mouth 3 (three) times daily with meals.    [provider]  glucose blood (ACCU-CHEK COMPACT PLUS) test strip Use 4 (four) times daily Use as instructed. 11/13/17   [provider]  hydrALAZINE (APRESOLINE) 50 MG tablet Take 50 mg by mouth 3 (three) times daily.    [provider]  hydrochlorothiazide (HYDRODIURIL) 25 MG tablet  05/08/18   [provider]  HYDROcodone-acetaminophen (NORCO/VICODIN) 5-325 MG tablet TK 1 T PO QD PRF PAIN 03/19/18   [provider]  insulin aspart (NOVOLOG FLEXPEN) 100 UNIT/ML FlexPen Inject into the skin. Sliding scale 121-150 2 units; 151-200 3 units; 201-250 5 units; 251-300 8 units; 301-350 11 units; 351-400 15 units, grater than 400 - 15 units, call doctor.    [provider]  Lancets Misc. (UNISTIK 2 NORMAL) MISC Use 1 each 3 (three) times daily Use as instructed. 09/23/17 09/23/18  [provider]  lisinopril (PRINIVIL,ZESTRIL) 10 MG tablet Take 10 mg by mouth daily.    [provider]  lisinopril (PRINIVIL,ZESTRIL) 20 MG tablet Take by mouth. 02/27/18 02/27/19  [provider]  lisinopril (PRINIVIL,ZESTRIL) 40 MG tablet  05/09/18   [provider]  metoprolol tartrate (LOPRESSOR) 100 MG tablet Take 1 tablet (100 mg total) by mouth 2 (two) times daily. 04/12/17   Hosie Poisson, MD  omeprazole (PRILOSEC) 40 MG capsule Take 1 capsule (40 mg total) by mouth 2 (two) times daily  for 14 days. 02/14/18 02/28/18  Lin Landsman, MD  rivaroxaban (XARELTO) 20 MG TABS tablet Take 20 mg by mouth daily with supper.    [provider]     Allergies Patient has no known allergies.  Family History  Problem Relation Age of Onset  . Hypertension Mother   . Stroke Mother   . Diabetes Father   . Renal Disease Father   . Hypertension Sister   . Diabetes Sister   . Renal Disease Sister   . Iron deficiency Sister     Social History Social History   Tobacco Use  . Smoking status: Never Smoker  . Smokeless tobacco: Never Used  Substance Use Topics  . Alcohol use: No  . Drug use: No    Review of Systems  Constitutional: No fever/chills Eyes: No visual changes.  ENT: No sore throat. Cardiovascular: Denies chest pain. Respiratory: Denies shortness of breath. Gastrointestinal: As above Genitourinary: Negative for dysuria. Musculoskeletal: Negative for back pain. Skin: Negative for rash. Neurological: Negative for headaches    ____________________________________________   PHYSICAL EXAM:  VITAL SIGNS: ED Triage Vitals  Enc Vitals Group     BP 05/20/18 0621 (!) 163/101     Pulse Rate 05/20/18 0621 77     Resp 05/20/18 0621 18     Temp 05/20/18 0621 98.5 F (36.9 C)     Temp src --      SpO2 05/20/18 0621 98 %     Weight 05/20/18 0620 117.5 kg (259 lb)     Height 05/20/18 0620 1.803 m (5\' 11" )     Head Circumference --      Peak Flow --      Pain Score 05/20/18 0620 9     Pain Loc --      Pain Edu? --      Excl. in [place]? --     Constitutional: Alert and oriented. No acute distress.  Eyes: Conjunctivae are normal.   Nose: No congestion/rhinnorhea. Mouth/Throat: Mucous membranes are moist.    Cardiovascular: Normal rate, regular rhythm. Grossly normal heart sounds.  Good peripheral circulation. Respiratory: Normal respiratory effort.  No retractions.  Gastrointestinal: Reassuring exam, no tenderness to palpation. No distention.     Genitourinary: deferred Musculoskeletal:  Warm and well perfused Neurologic:  Normal speech and language. No gross focal neurologic deficits are appreciated.  Skin:  Skin is warm, dry and intact. No rash noted. Psychiatric: Mood and affect are normal. Speech and behavior are normal.  ____________________________________________   LABS (all labs ordered are listed, but only abnormal results are displayed)  Labs Reviewed - No data to display ____________________________________________  EKG  None ____________________________________________  RADIOLOGY  None ____________________________________________   PROCEDURES  Procedure(s) performed: No  Procedures   Critical Care performed: No ____________________________________________   INITIAL IMPRESSION / ASSESSMENT AND PLAN / ED COURSE  Pertinent labs & imaging results that were available during my care of the patient were reviewed by me and considered in my medical decision making (see chart for details).  Patient with essentially chronic abdominal pain which is somewhat episodic and related to eating.  Certainly gastritis/PUD could be the cause of this however patient has never had imaging of his gallbladder, will send for right upper quadrant ultrasound.  He states he is feeling much better currently, just had labs 4 days ago, no need for repeat  Ultrasound negative for gallstones.  Patient asymptomatic, will have him follow-up with GI for further evaluation    ____________________________________________   FINAL CLINICAL IMPRESSION(S) / ED DIAGNOSES  Final diagnoses:  Upper abdominal pain  Epigastric pain        Note:  This document was prepared using Dragon voice recognition software and may include unintentional dictation errors.    Lavonia Drafts, MD 05/20/18 [phone]

## 2018-05-20 NOTE — Telephone Encounter (Signed)
Patient LVM that he is still having abd pain, pretty severe. What can he do?

## 2018-05-20 NOTE — ED Triage Notes (Signed)
Pt arrived via [facility] from work with c/o abdominal pain over the last week that has gotten worse tonight. EMS states the pt has had episodes of epigastric pain similar to this but the pain is "just really bad."

## 2018-05-20 NOTE — ED Notes (Signed)
Patient transported to Ultrasound 

## 2018-05-21 NOTE — Telephone Encounter (Signed)
Pt has been scheduled for 05/23/18

## 2018-05-23 ENCOUNTER — Encounter: Payer: Self-pay | Admitting: Gastroenterology

## 2018-05-23 ENCOUNTER — Ambulatory Visit: Payer: BLUE CROSS/BLUE SHIELD | Admitting: Gastroenterology

## 2018-05-23 ENCOUNTER — Ambulatory Visit (INDEPENDENT_AMBULATORY_CARE_PROVIDER_SITE_OTHER): Payer: BLUE CROSS/BLUE SHIELD | Admitting: Gastroenterology

## 2018-05-23 VITALS — BP 142/87 | HR 74 | Resp 17 | Ht 71.0 in | Wt 263.6 lb

## 2018-05-23 DIAGNOSIS — R1013 Epigastric pain: Secondary | ICD-10-CM

## 2018-05-23 DIAGNOSIS — Z8619 Personal history of other infectious and parasitic diseases: Secondary | ICD-10-CM

## 2018-05-23 MED ORDER — OMEPRAZOLE 40 MG PO CPDR: 40.0000 mg | DELAYED_RELEASE_CAPSULE | Freq: Two times a day (BID) | ORAL | 0 refills | Status: DC

## 2018-05-23 NOTE — Progress Notes (Signed)
[name] Darby, MD [address]  [place]  [place], [place] [address]  Main: [phone]  Fax: [phone]    Gastroenterology Consultation  Referring Provider:     Tracie Harrier, MD Primary Care Physician:  Tracie Harrier, MD Primary Gastroenterologist:  Dr. Cephas Price Reason for Consultation: Upper abdominal pain        HPI:   DEON DUER is a 47 y.o. African-American male referred by Dr. Tracie Harrier, MD  for consultation & management of chronic epigastric pain.patient is to metabolic syndrome, reports that he has been suffering from intermittent episodes of sudden onset of epigastric pain since 2011. He has been diabetic prior to 2011, his initial A1c was 15.5, currently on insulin. He reports episodes of upper abdominal pain to be sudden onset, sharp, worse with standing and feels better by lying down. These episodes occur once every few weeks to 6 months or so. Most recent episode was last week when he went to [facility] ER. His basic labs were unremarkable, ultrasound revealed fatty liver, CT A/P revealed left inguinal hernia. He was discharged on Bentyl and Pepcid. He said he could not afford Bentyl. He did not try Pepcid. He denies any relation to food, not associated with nausea, vomiting, bloating, early satiety. He denies weight loss. Over the years, his A1c has improved but still high. Most recent value is 9.7. He denies smoking or drinking alcohol. He denies any lower GI symptoms   Follow-up visit 05/04/2018 He underwent EGD and colonoscopy.  EGD confirmed H. pylori gastritis status post triple therapy for 2 weeks.  Patient reports that epigastric pain has significantly improved.  Currently, pain is sporadic and mild.  His diabetes is significantly improved, most recent hemoglobin A1c is 7.6 in 02/2018.  He reports that he is watching his diet carefully.  He denies any other GI symptoms.  Follow-up visit 05/23/2018 Patient  went to ER 3 days ago due to worsening of upper abdominal pain for the last week that has gotten worse.  He reports that this pain is similar to the episodes in the past.  He had an ultrasound right upper quadrant which revealed mild fatty liver only, there was no evidence of cholelithiasis, CBD was normal.  I perform H. pylori breath test after treating his H. pylori with triple therapy, which came back negative.  Patient is off omeprazole since completion of treatment for triple therapy. Patient has been gradually gaining weight.  He attributes it to sedentary lifestyle, unable to exercise due to leg pain He denies eating much, denies drinking sodas, red meat, bread or fried foods  NSAIDs: none  Antiplts/Anticoagulants/Anti thrombotics: on rivaroxaban with history of chronic DVT since 03/2017. Patient reports that the repeat ultrasound Doppler was inconclusive, therefore he decided to stay on the medication, however he is taking only twice weekly.  GI Procedures:  EGD 02/10/2018 - Normal duodenal bulb and second portion of the duodenum. - Scar in the gastric antrum. - Erythematous mucosa in the greater curvature of the gastric body. Biopsied. - Salmon-colored mucosa suspicious for Barrett's esophagus. Biopsied. - Normal esophagus. - Ectopic gastric mucosa in the upper third of the esophagus.  Colonoscopy 02/10/2018 - One diminutive polyp in the cecum, removed with a cold biopsy forceps. Resected and retrieved. - One 7 mm polyp in the ascending colon, removed with a hot snare. Resected and retrieved. - Three 4 to 5 mm polyps in the descending colon, removed with a cold snare. Resected  and retrieved. - Non-bleeding internal hemorrhoids. - Diverticulosis in the ascending colon. He denies family history of GI malignancy  DIAGNOSIS:  A. STOMACH; COLD BIOPSY:  - ANTRAL AND OXYNTIC MUCOSA WITH PATCHY MILD CHRONIC ACTIVE GASTRITIS  WITH RARE HELICOBACTER ORGANISMS IDENTIFIED ON IHC STAIN.  -  NEGATIVE FOR DYSPLASIA AND MALIGNANCY.   B. GEJ; COLD BIOPSY:  - PREDOMINANTLY SQUAMOUS MUCOSA WITH FOCAL GASTRIC CARDIAC GLANDULAR  MUCOSA.  - NEGATIVE FOR GOBLET CELLS, DYSPLASIA, AND MALIGNANCY.   C. COLON POLYP, CECUM; COLD BIOPSY:  - COLONIC MUCOSA WITH PROMINENT LYMPHOID AGGREGATE.  - NEGATIVE FOR DYSPLASIA AND MALIGNANCY.   D. COLON POLYP, ASCENDING; HOT SNARE:  - SESSILE SERRATED ADENOMA.  - NEGATIVE FOR HIGH-GRADE DYSPLASIA AND MALIGNANCY.   E. COLON POLYPS X3, DESCENDING; COLD SNARE AND COLD BIOPSY:  - SESSILE SERRATED ADENOMA (2 FRAGMENTS).  - TUBULAR ADENOMA (2 FRAGMENTS).  - NEGATIVE FOR HIGH-GRADE DYSPLASIA AND MALIGNANCY.   Past Medical History:  Diagnosis Date  . ARF (acute renal failure) ([place])   . Cellulitis of right lower leg 03/31/2017  . Chronic anticoagulation 03/31/2017  . Diabetes mellitus without complication ([place])   . Diabetic ulcer of calf ([place]) 03/31/2017  . DVT of proximal lower limb ([place]) 03/20/2017   R calf area  . Gout   . History of hiatal hernia   . Hypertension   . Hypoalbuminemia 03/31/2017  . Necrotizing fasciitis ([place])   . Right leg DVT ([place]) 03/31/2017  . Sinus tachycardia 03/31/2017  . Type 2 diabetes mellitus with stage 2 chronic kidney disease, with long-term current use of insulin ([place]) 03/27/2017    Past Surgical History:  Procedure Laterality Date  . COLONOSCOPY WITH PROPOFOL N/A 02/10/2018   Procedure: COLONOSCOPY WITH PROPOFOL;  Surgeon: Lin Landsman, MD;  Location: [facility] ENDOSCOPY;  Service: Gastroenterology;  Laterality: N/A;  . ESOPHAGOGASTRODUODENOSCOPY (EGD) WITH PROPOFOL N/A 02/10/2018   Procedure: ESOPHAGOGASTRODUODENOSCOPY (EGD) WITH PROPOFOL;  Surgeon: Lin Landsman, MD;  Location: [facility] ENDOSCOPY;  Service: Gastroenterology;  Laterality: N/A;  . I&D EXTREMITY Right 04/03/2017   Procedure: IRRIGATION AND DEBRIDEMENT RIGHT LEG, APPLY WOUND VAC;  Surgeon: Newt Minion, MD;  Location: [place];  Service:  Orthopedics;  Laterality: Right;  . I&D EXTREMITY Right 04/05/2017   Procedure: IRRIGATION AND DEBRIDEMENT RIGHT LEG;  Surgeon: Newt Minion, MD;  Location: [place];  Service: Orthopedics;  Laterality: Right;  . SCROTAL SURGERY  2006  . SKIN SPLIT GRAFT Right 04/10/2017   Procedure: SKIN GRAFT SPLIT THICKNESS RIGHT LEG;  Surgeon: Newt Minion, MD;  Location: [place];  Service: Orthopedics;  Laterality: Right;    Current Outpatient Medications:  .  ACCU-CHEK FASTCLIX LANCETS MISC, U UTD TO CHECK BLOOD SUGAR TID, Disp: , Rfl: 4 .  ACCU-CHEK GUIDE test strip, USE TO TEST QID UTD, Disp: , Rfl: 1 .  amLODipine (NORVASC) 10 MG tablet, Take 10 mg by mouth daily., Disp: , Rfl:  .  cyclobenzaprine (FLEXERIL) 5 MG tablet, Take 1 tablet (5 mg total) by mouth 3 (three) times daily as needed for muscle spasms., Disp: 30 tablet, Rfl: 0 .  ferrous sulfate 325 (65 FE) MG EC tablet, Take 325 mg by mouth 3 (three) times daily with meals., Disp: , Rfl:  .  glucose blood (ACCU-CHEK COMPACT PLUS) test strip, Use 4 (four) times daily Use as instructed., Disp: , Rfl:  .  hydrALAZINE (APRESOLINE) 50 MG tablet, Take 50 mg by mouth 3 (three) times daily., Disp: , Rfl:  .  hydrochlorothiazide (HYDRODIURIL) 25 MG tablet, , Disp: , Rfl: 6 .  HYDROcodone-acetaminophen (NORCO/VICODIN) 5-325 MG tablet, TK 1 T PO QD PRF PAIN, Disp: , Rfl: 0 .  insulin aspart (NOVOLOG FLEXPEN) 100 UNIT/ML FlexPen, Inject into the skin. Sliding scale 121-150 2 units; 151-200 3 units; 201-250 5 units; 251-300 8 units; 301-350 11 units; 351-400 15 units, grater than 400 - 15 units, call doctor., Disp: , Rfl:  .  Lancets Misc. (UNISTIK 2 NORMAL) MISC, Use 1 each 3 (three) times daily Use as instructed., Disp: , Rfl:  .  lisinopril (PRINIVIL,ZESTRIL) 40 MG tablet, , Disp: , Rfl: 2 .  metoprolol tartrate (LOPRESSOR) 100 MG tablet, Take 1 tablet (100 mg total) by mouth 2 (two) times daily., Disp: , Rfl:  .  rivaroxaban (XARELTO) 20 MG TABS tablet, Take  20 mg by mouth daily with supper., Disp: , Rfl:  .  Amino Acids-Protein Hydrolys (FEEDING SUPPLEMENT, PRO-STAT SUGAR FREE 64,) LIQD, Take 30 mLs by mouth 3 (three) times daily between meals. (Patient not taking: Reported on 05/23/2018), Disp: 900 mL, Rfl: 0 .  lisinopril (PRINIVIL,ZESTRIL) 10 MG tablet, Take 10 mg by mouth daily., Disp: , Rfl:  .  lisinopril (PRINIVIL,ZESTRIL) 20 MG tablet, Take by mouth., Disp: , Rfl:  .  omeprazole (PRILOSEC) 40 MG capsule, Take 1 capsule (40 mg total) by mouth 2 (two) times daily., Disp: 180 capsule, Rfl: 0    Family History  Problem Relation Age of Onset  . Hypertension Mother   . Stroke Mother   . Diabetes Father   . Renal Disease Father   . Hypertension Sister   . Diabetes Sister   . Renal Disease Sister   . Iron deficiency Sister      Social History   Tobacco Use  . Smoking status: Never Smoker  . Smokeless tobacco: Never Used  Substance Use Topics  . Alcohol use: No  . Drug use: No    Allergies as of 05/23/2018  . (No Known Allergies)    Review of Systems:    All systems reviewed and negative except where noted in HPI.   Physical Exam:  BP (!) 142/87 (BP Location: Left Arm, Patient Position: Sitting, Cuff Size: Large)   Pulse 74   Resp 17   Ht 5\' 11"  (1.803 m)   Wt 263 lb 9.6 oz (119.6 kg)   BMI 36.76 kg/m  No LMP for male patient.  General:   Alert,  Well-developed, well-nourished, pleasant and cooperative in NAD Head:  Normocephalic and atraumatic. Eyes:  Sclera clear, no icterus.   Conjunctiva pink. Ears:  Normal auditory acuity. Nose:  No deformity, discharge, or lesions. Mouth:  No deformity or lesions,oropharynx pink & moist. Neck:  Supple; no masses or thyromegaly. Lungs:  Respirations even and unlabored.  Clear throughout to auscultation.   No wheezes, crackles, or rhonchi. No acute distress. Heart:  Regular rate and rhythm; no murmurs, clicks, rubs, or gallops. Abdomen:  Normal bowel sounds. Soft,Obese,  tenderness in the epigastric and supraumbilical areas, and distended, tympanic to percussion without masses, hepatosplenomegaly, left inguinal hernia, nontender.  No guarding or rebound tenderness.   Rectal: Not performed Msk:  Symmetrical without gross deformities. Good, equal movement & strength bilaterally. Pulses:  Normal pulses noted. Extremities:  No clubbing, 1+ edema.  No cyanosis. Neurologic:  Alert and oriented x3;  grossly normal neurologically. Skin:  Intact without significant lesions or rashes. No jaundice. Lymph Nodes:  No significant cervical adenopathy. Psych:  Alert and cooperative. Normal  mood and affect.  Imaging Studies: reviewed  Assessment and Plan:   SOLMON BOHR is a 47 y.o. African-American male with metabolic syndrome, poorly controlled diabetes mellitus presents with chronic intermittent episodes of epigastric pain without any other constitutional symptoms. Most recent evaluation including CBC, LFTs, lipase, ultrasound and CT A/P were fairly unremarkable except for evidence of left inguinal hernia.    Chronic intermittent epigastric pain: EGD revealed H. pylori gastritis, status post triple therapy  H. pylori breath test confirmed eradication, patient is currently off PPI Restart omeprazole 40 mg twice daily Encouraged him to incorporate some exercise and try to lose weight  Tubular adenomas of colon and sessile serrated adenoma: Recommend colonoscopy in 3 years  Follow up in 1 month   [name] Darby, MD

## 2018-06-20 ENCOUNTER — Ambulatory Visit: Payer: BLUE CROSS/BLUE SHIELD | Admitting: Gastroenterology

## 2018-07-04 ENCOUNTER — Ambulatory Visit: Payer: BLUE CROSS/BLUE SHIELD | Admitting: Gastroenterology

## 2018-09-21 IMAGING — CR DG TIBIA/FIBULA 2V*R*
4 series · 4 of 4 positions shown · non-contrast
Comparison: None.

CLINICAL DATA: Laceration of the calf a few months ago. Patient is
on Xarelto for blood clot and has increased bleeding of the wound in
the back of the right calf. Rule out free air.

EXAM:
RIGHT TIBIA AND FIBULA - 2 VIEW

[tibia ap (1 of 2)]
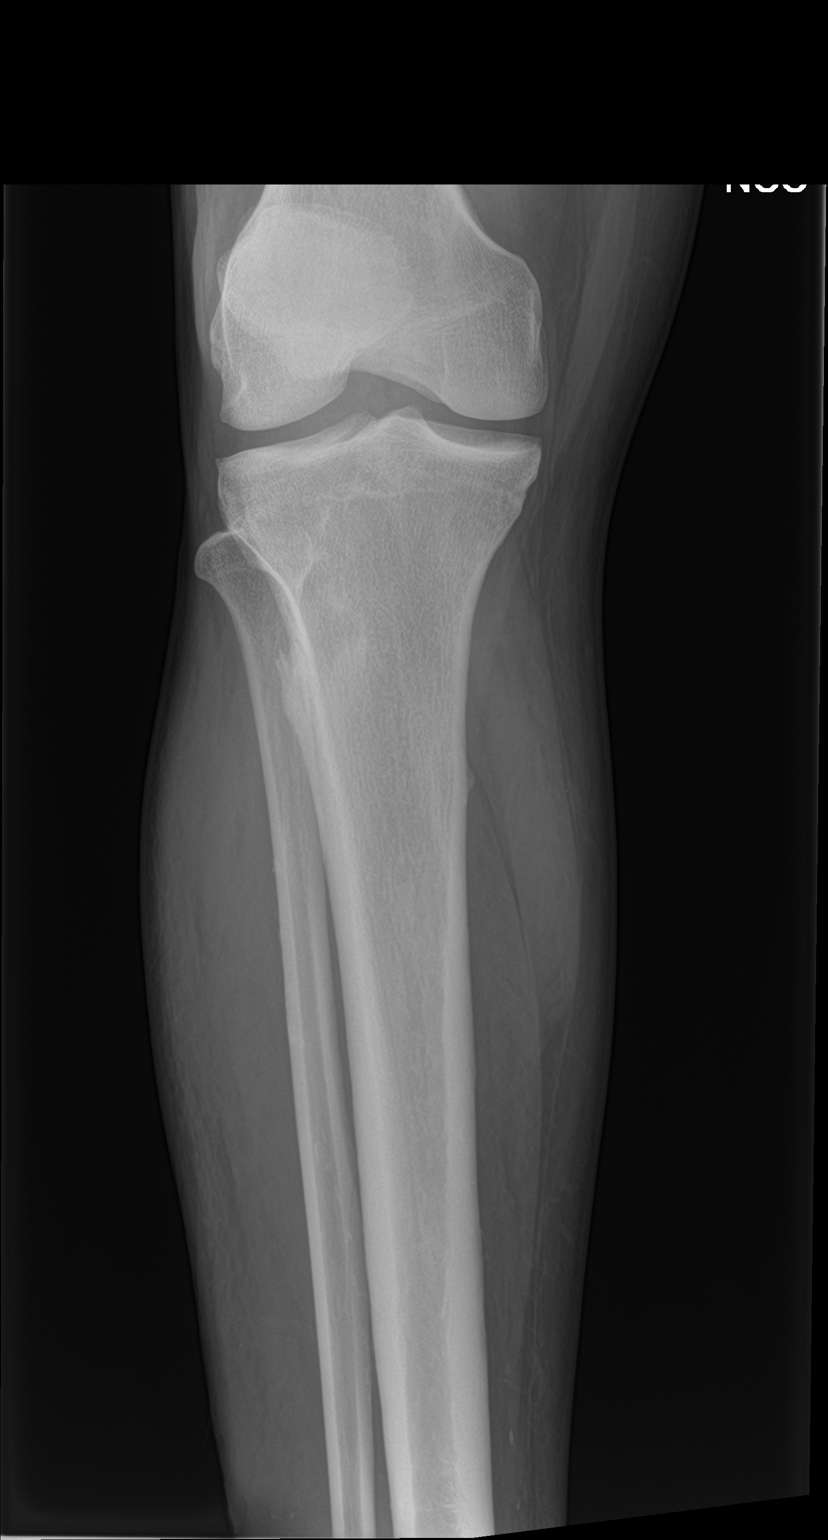

[tibia ap (2 of 2)]
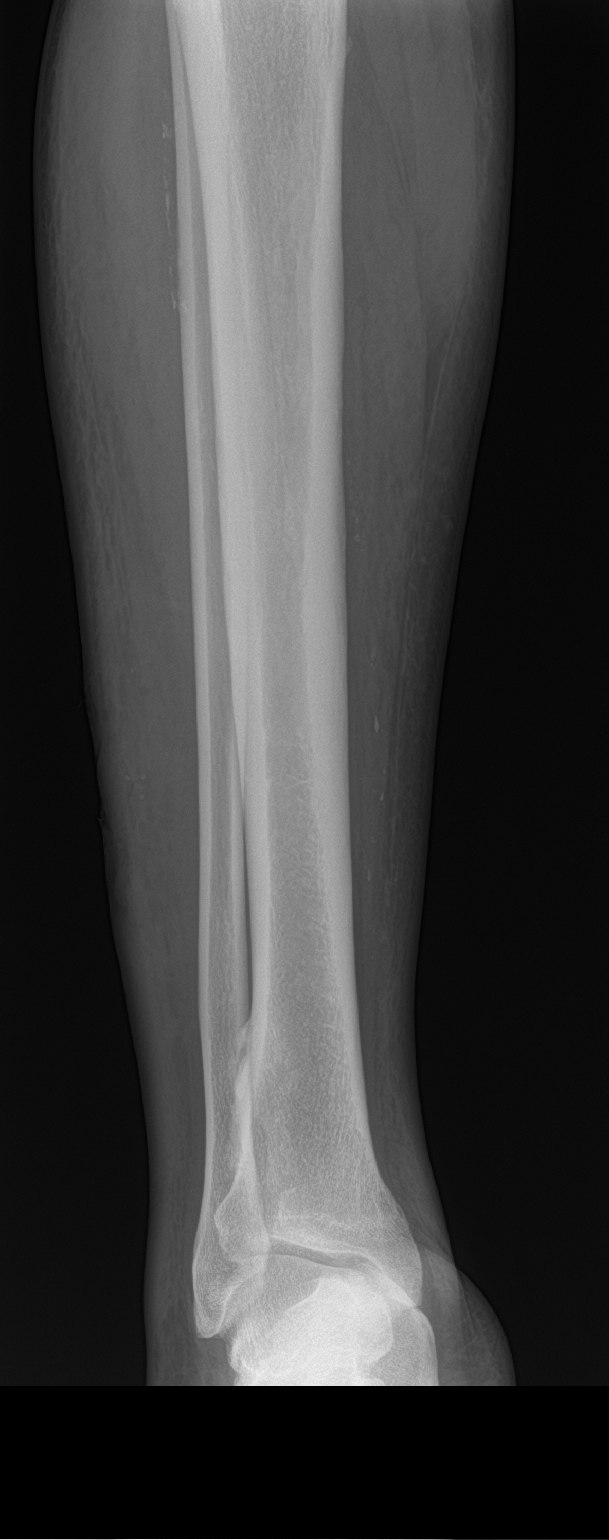

[tibia lat (1 of 2)]
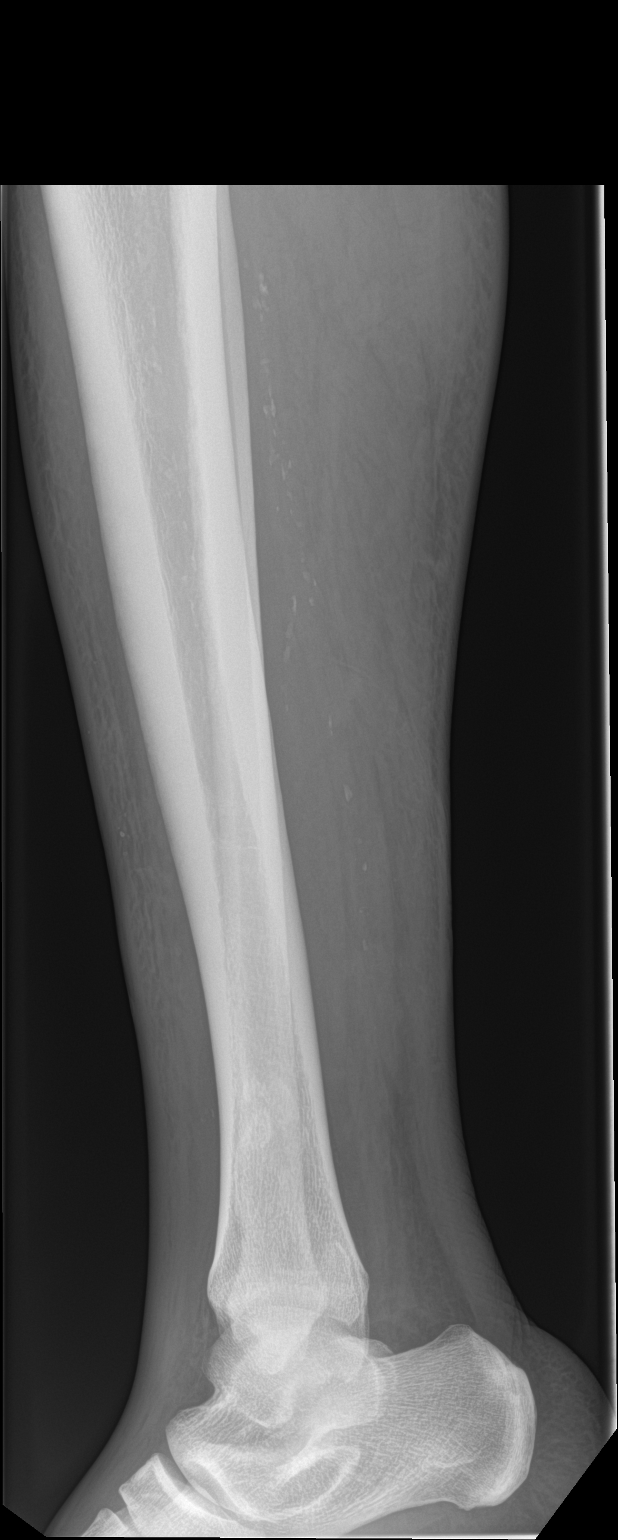

[tibia lat (2 of 2)]
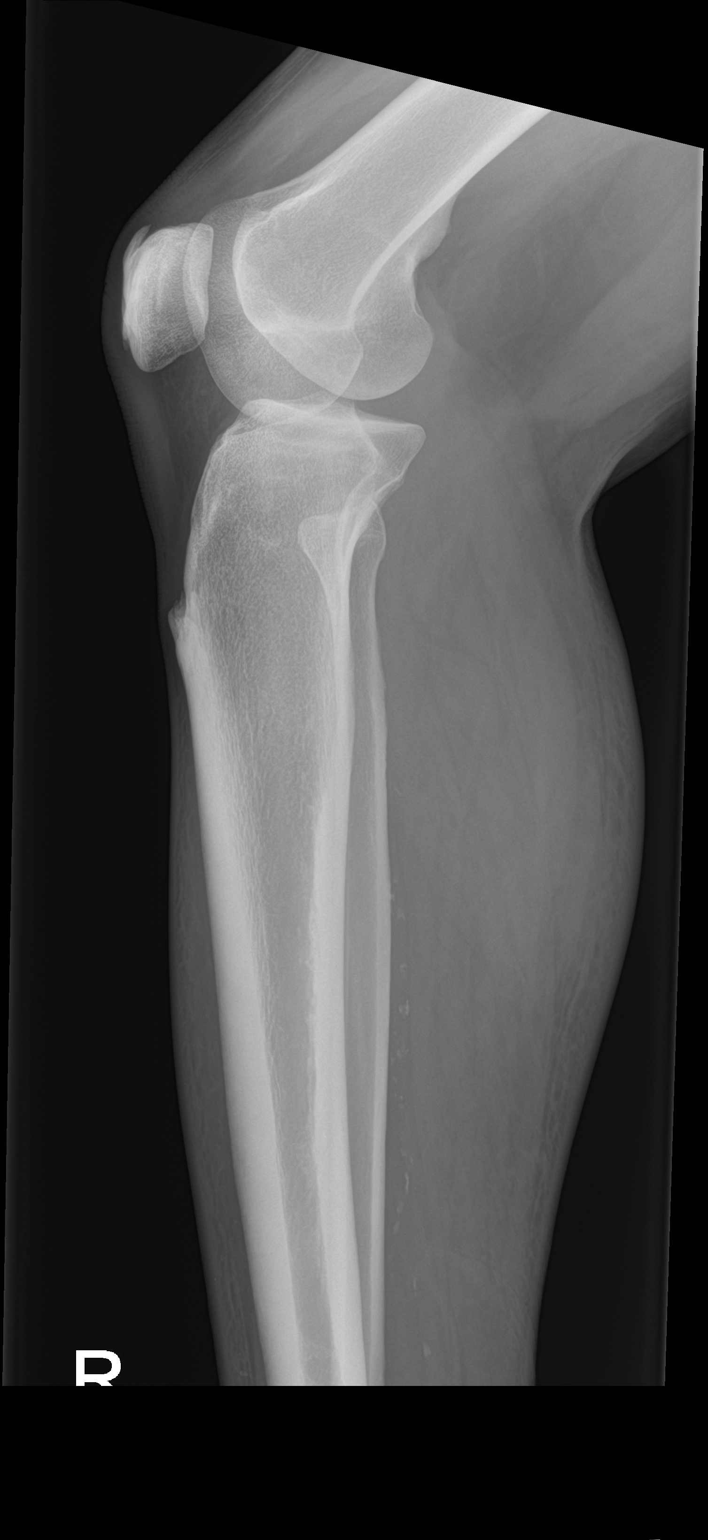

[4 of 4 positions shown; findings below may reference images not displayed]

FINDINGS: There is generalized mild soft tissue induration and swelling of the
right leg. There is a soft tissue laceration is seen along the
lateral aspect of the leg at the junction of the middle and distal
third. No soft tissue emphysema is seen. No acute osseous
abnormality is noted. Distal intraosseous membrane ossifications are
seen between the tibia and fibula.
IMPRESSION: Generalized soft tissue swelling of the right leg with soft tissue
laceration at the junction of the middle and distal third laterally.
No soft tissue emphysema or suspicious osseous abnormality.

## 2018-09-23 IMAGING — DX DG CHEST 2V
2 series · 2 of 2 positions shown · non-contrast
Comparison: None.

CLINICAL DATA: Fever and sepsis

EXAM:
CHEST  2 VIEW

[x chest ap]
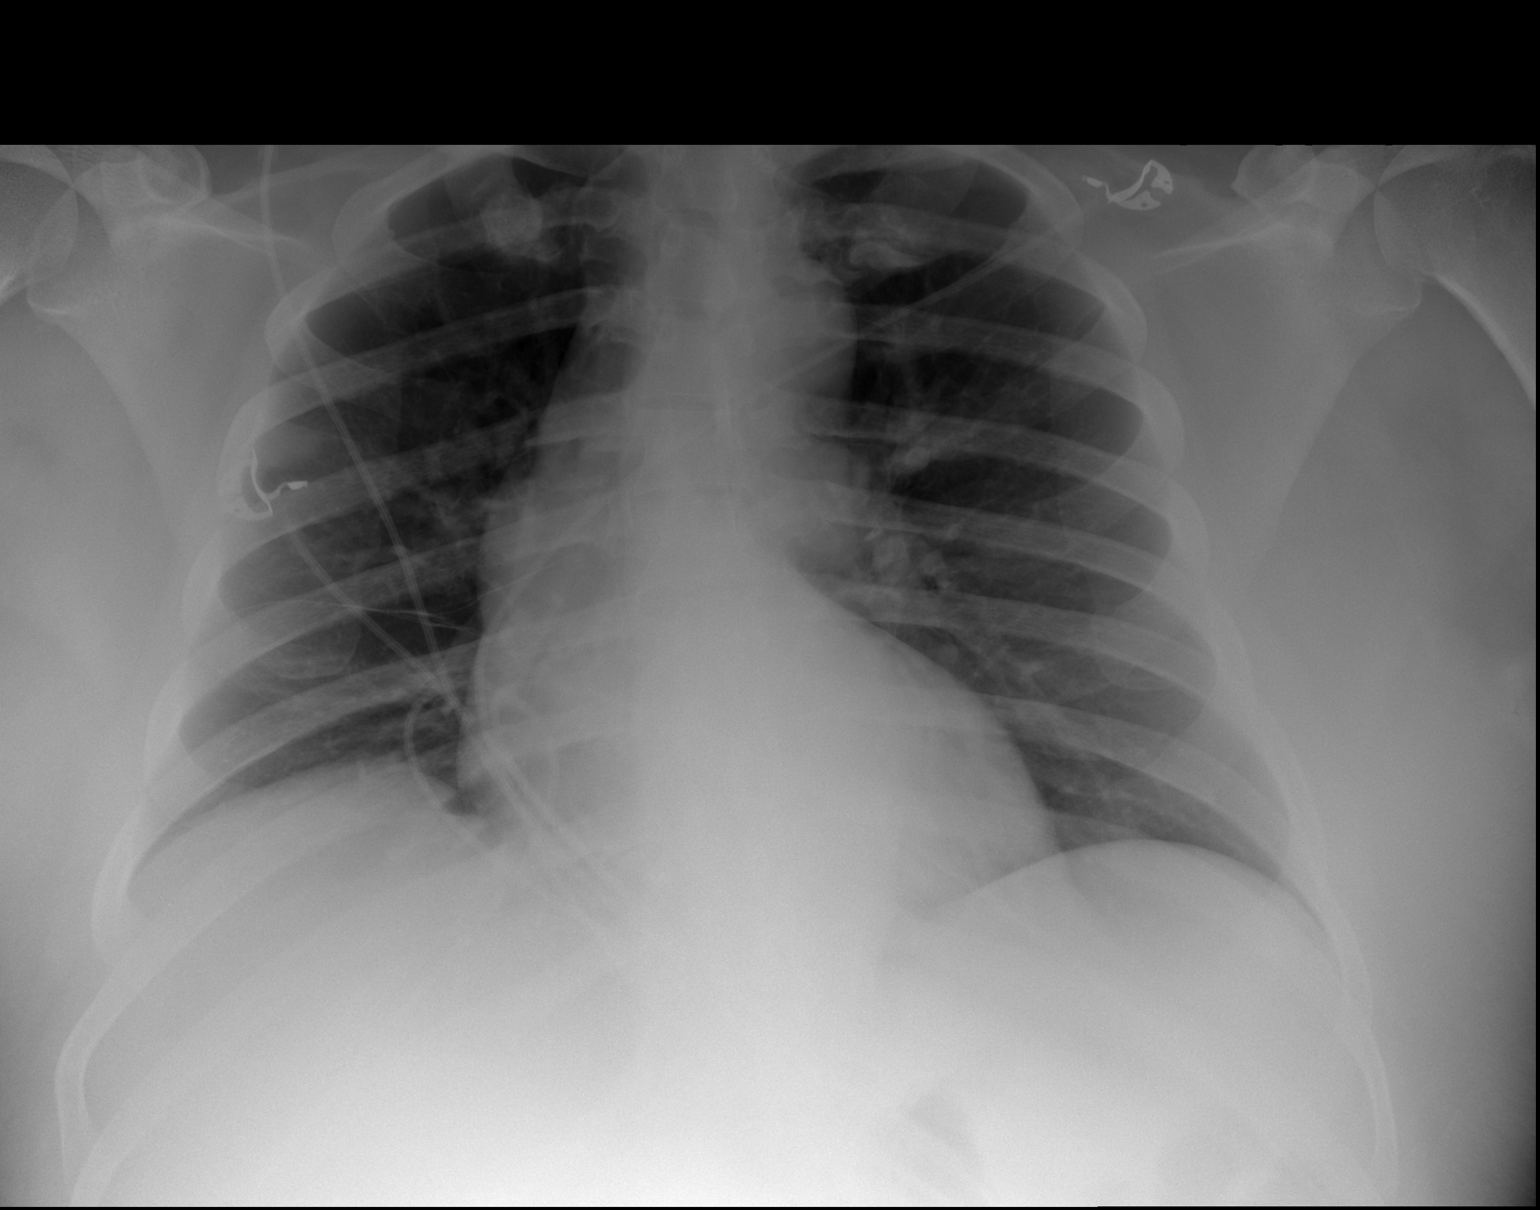

[w chest lat]
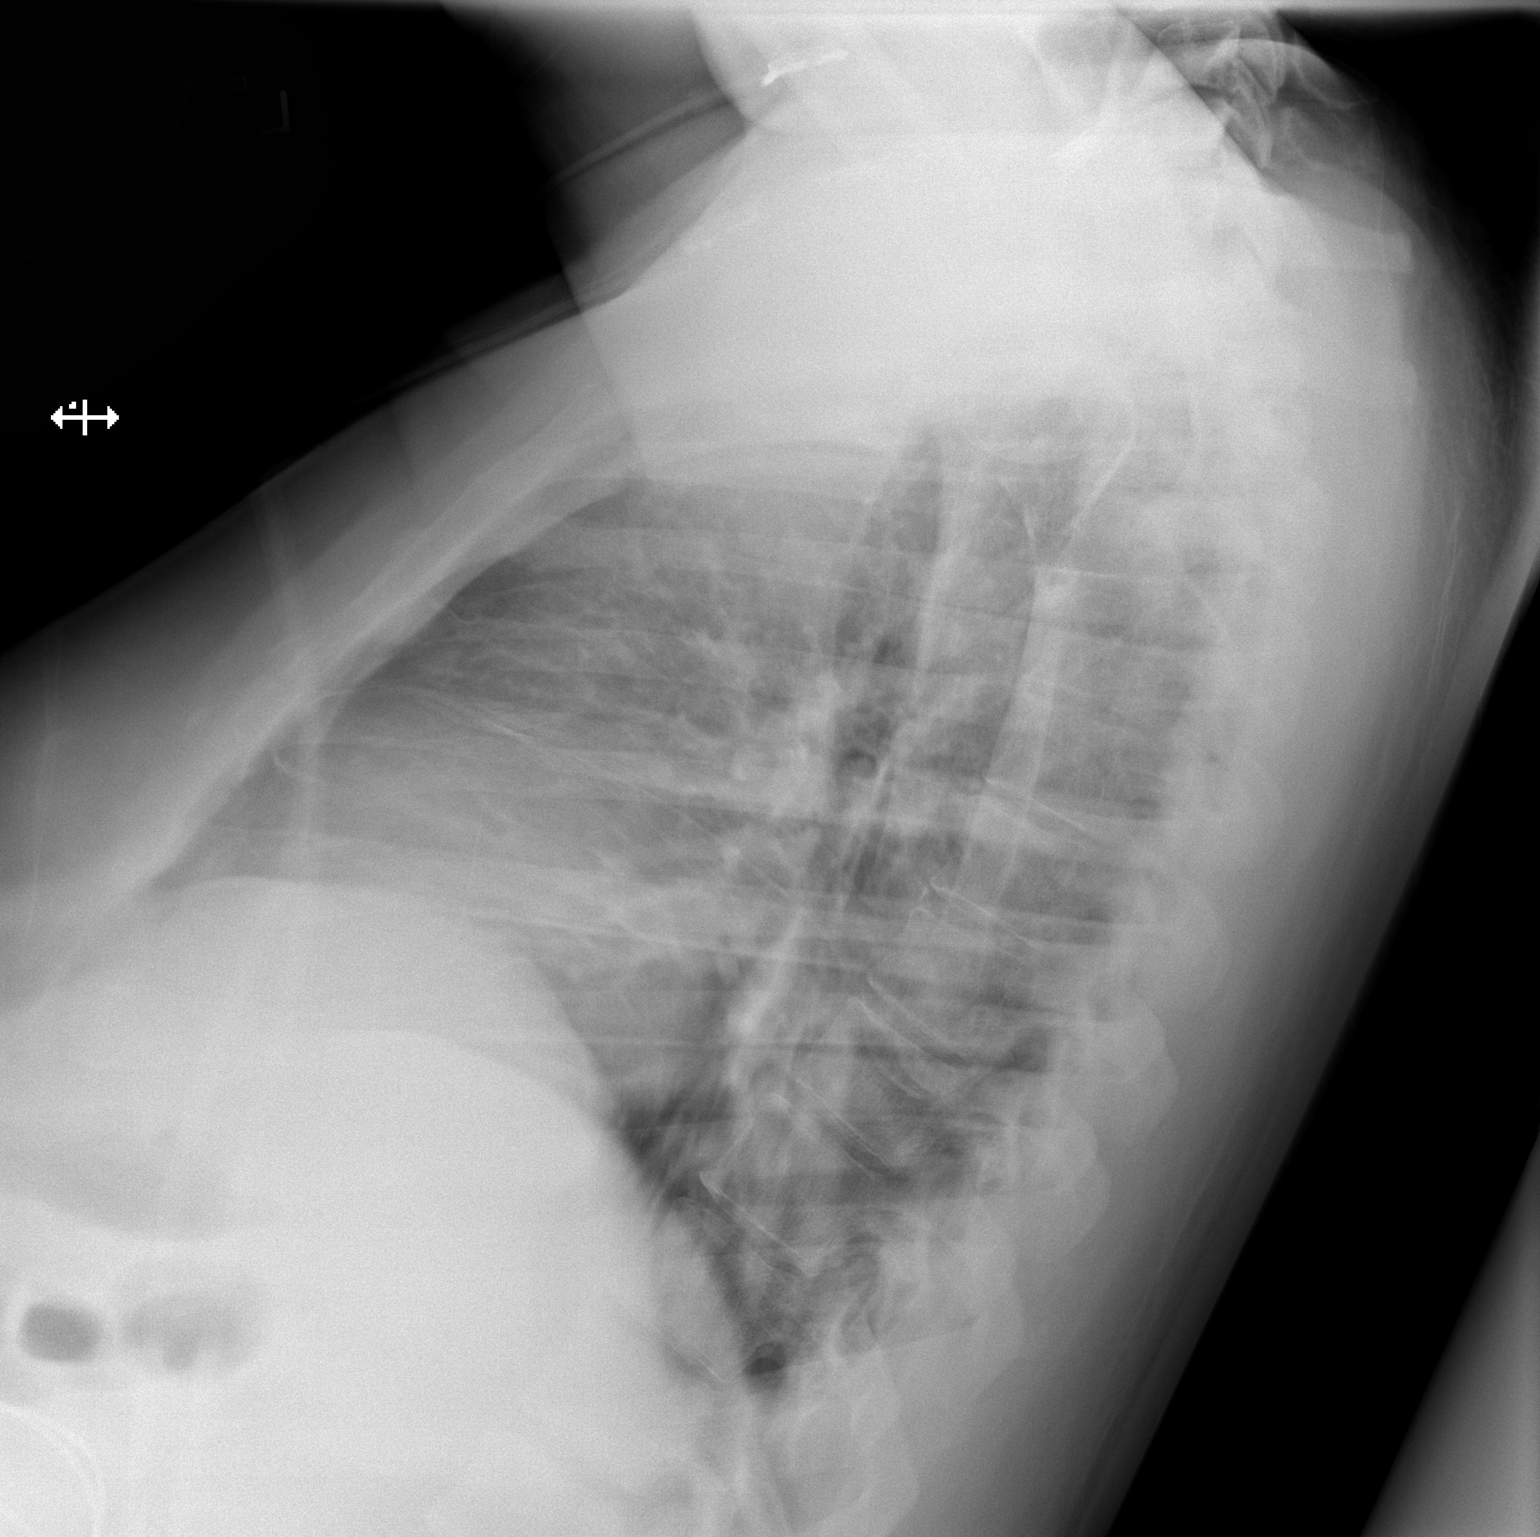

[2 of 2 positions shown; findings below may reference images not displayed]

FINDINGS: Lateral imaging is degraded by motion.

Normal heart size. Mild aortic tortuosity. No acute infiltrate or
edema. No effusion or pneumothorax. No acute osseous findings.
IMPRESSION: No evidence of active cardiopulmonary disease.

## 2018-12-30 ENCOUNTER — Encounter (HOSPITAL_COMMUNITY)
Admission: EM | Disposition: A | Discharge status: Home Health | Payer: Self-pay | Source: Home / Self Care | Attending: Family Medicine

## 2018-12-30 ENCOUNTER — Encounter (HOSPITAL_COMMUNITY): Payer: Self-pay | Admitting: Emergency Medicine

## 2018-12-30 ENCOUNTER — Inpatient Hospital Stay: Admit: 2018-12-30 | Payer: Self-pay | Admitting: Orthopaedic Surgery

## 2018-12-30 ENCOUNTER — Inpatient Hospital Stay (HOSPITAL_COMMUNITY)
Admission: EM | Admit: 2018-12-30 | Discharge: 2019-01-02 | DRG: 508 | Disposition: A | Discharge status: Home Health | Payer: Self-pay | Attending: Family Medicine | Admitting: Family Medicine

## 2018-12-30 ENCOUNTER — Other Ambulatory Visit: Payer: Self-pay

## 2018-12-30 ENCOUNTER — Encounter (HOSPITAL_COMMUNITY): Payer: Self-pay | Admitting: Certified Registered Nurse Anesthetist

## 2018-12-30 ENCOUNTER — Emergency Department (HOSPITAL_COMMUNITY): Payer: Self-pay

## 2018-12-30 DIAGNOSIS — I129 Hypertensive chronic kidney disease with stage 1 through stage 4 chronic kidney disease, or unspecified chronic kidney disease: Secondary | ICD-10-CM | POA: Diagnosis present

## 2018-12-30 DIAGNOSIS — I872 Venous insufficiency (chronic) (peripheral): Secondary | ICD-10-CM | POA: Diagnosis present

## 2018-12-30 DIAGNOSIS — Z79899 Other long term (current) drug therapy: Secondary | ICD-10-CM

## 2018-12-30 DIAGNOSIS — Z7901 Long term (current) use of anticoagulants: Secondary | ICD-10-CM

## 2018-12-30 DIAGNOSIS — E1122 Type 2 diabetes mellitus with diabetic chronic kidney disease: Secondary | ICD-10-CM

## 2018-12-30 DIAGNOSIS — Z86718 Personal history of other venous thrombosis and embolism: Secondary | ICD-10-CM

## 2018-12-30 DIAGNOSIS — Z79891 Long term (current) use of opiate analgesic: Secondary | ICD-10-CM

## 2018-12-30 DIAGNOSIS — Z8619 Personal history of other infectious and parasitic diseases: Secondary | ICD-10-CM

## 2018-12-30 DIAGNOSIS — I10 Essential (primary) hypertension: Secondary | ICD-10-CM | POA: Diagnosis present

## 2018-12-30 DIAGNOSIS — Z20828 Contact with and (suspected) exposure to other viral communicable diseases: Secondary | ICD-10-CM | POA: Diagnosis present

## 2018-12-30 DIAGNOSIS — L97909 Non-pressure chronic ulcer of unspecified part of unspecified lower leg with unspecified severity: Secondary | ICD-10-CM | POA: Diagnosis present

## 2018-12-30 DIAGNOSIS — N182 Chronic kidney disease, stage 2 (mild): Secondary | ICD-10-CM | POA: Diagnosis present

## 2018-12-30 DIAGNOSIS — M009 Pyogenic arthritis, unspecified: Principal | ICD-10-CM | POA: Diagnosis present

## 2018-12-30 DIAGNOSIS — I83009 Varicose veins of unspecified lower extremity with ulcer of unspecified site: Secondary | ICD-10-CM | POA: Diagnosis present

## 2018-12-30 DIAGNOSIS — Z794 Long term (current) use of insulin: Secondary | ICD-10-CM

## 2018-12-30 HISTORY — PX: IRRIGATION AND DEBRIDEMENT ABSCESS: SHX5252

## 2018-12-30 LAB — CBC WITH DIFFERENTIAL/PLATELET
Abs Immature Granulocytes: 0.02 10*3/uL (ref 0.00–0.07)
Basophils Absolute: 0 10*3/uL (ref 0.0–0.1)
Basophils Relative: 0 %
Eosinophils Absolute: 0 10*3/uL (ref 0.0–0.5)
Eosinophils Relative: 0 %
HCT: 49.6 % (ref 39.0–52.0)
Hemoglobin: 16.1 g/dL (ref 13.0–17.0)
Immature Granulocytes: 0 %
Lymphocytes Relative: 18 %
Lymphs Abs: 1.5 10*3/uL (ref 0.7–4.0)
MCH: 29.8 pg (ref 26.0–34.0)
MCHC: 32.5 g/dL (ref 30.0–36.0)
MCV: 91.7 fL (ref 80.0–100.0)
Monocytes Absolute: 0.6 10*3/uL (ref 0.1–1.0)
Monocytes Relative: 7 %
Neutro Abs: 6.5 10*3/uL (ref 1.7–7.7)
Neutrophils Relative %: 75 %
Platelets: 258 10*3/uL (ref 150–400)
RBC: 5.41 MIL/uL (ref 4.22–5.81)
RDW: 13.1 % (ref 11.5–15.5)
WBC: 8.7 10*3/uL (ref 4.0–10.5)
nRBC: 0 % (ref 0.0–0.2)

## 2018-12-30 LAB — BASIC METABOLIC PANEL
Anion gap: 13 (ref 5–15)
BUN: 18 mg/dL (ref 6–20)
CO2: 21 mmol/L — ABNORMAL LOW (ref 22–32)
Calcium: 9.3 mg/dL (ref 8.9–10.3)
Chloride: 104 mmol/L (ref 98–111)
Creatinine, Ser: 1.32 mg/dL — ABNORMAL HIGH (ref 0.61–1.24)
GFR calc Af Amer: >60 mL/min (ref >60)
GFR calc non Af Amer: >60 mL/min (ref >60)
Glucose, Bld: 162 mg/dL — ABNORMAL HIGH (ref 70–99)
Potassium: 4.2 mmol/L (ref 3.5–5.1)
Sodium: 138 mmol/L (ref 135–145)

## 2018-12-30 LAB — SYNOVIAL CELL COUNT + DIFF, W/ CRYSTALS
Crystals, Fluid: NONE SEEN
Eosinophils-Synovial: 0 % (ref 0–1)
Lymphocytes-Synovial Fld: 0 % (ref 0–20)
Monocyte-Macrophage-Synovial Fluid: 7 % — ABNORMAL LOW (ref 50–90)
Neutrophil, Synovial: 93 % — ABNORMAL HIGH (ref 0–25)
WBC, Synovial: 64640 /mm3 — ABNORMAL HIGH (ref 0–200)

## 2018-12-30 LAB — SEDIMENTATION RATE: Sed Rate: 18 mm/hr — ABNORMAL HIGH (ref 0–16)

## 2018-12-30 LAB — C-REACTIVE PROTEIN: CRP: 5.4 mg/dL — ABNORMAL HIGH (ref <1.0)

## 2018-12-30 LAB — SARS CORONAVIRUS 2 BY RT PCR (HOSPITAL ORDER, PERFORMED IN ~~LOC~~ HOSPITAL LAB): SARS Coronavirus 2: NEGATIVE

## 2018-12-30 SURGERY — MINOR INCISION AND DRAINAGE OF ABSCESS
Anesthesia: General | Laterality: Right

## 2018-12-30 SURGERY — INCISION AND DRAINAGE, ABSCESS
Anesthesia: General | Laterality: Right

## 2018-12-30 MED ORDER — CYCLOBENZAPRINE HCL 5 MG PO TABS: 5.0000 mg | ORAL_TABLET | Freq: Three times a day (TID) | ORAL | Status: DC | PRN

## 2018-12-30 MED ORDER — BUPIVACAINE HCL (PF) 0.5 % IJ SOLN
INTRAMUSCULAR | Status: AC
  Filled 2018-12-30: qty 30

## 2018-12-30 MED ORDER — SODIUM CHLORIDE 0.9 % IV SOLN: 250.0000 mL | INTRAVENOUS | Status: DC | PRN

## 2018-12-30 MED ORDER — LIDOCAINE-EPINEPHRINE (PF) 2 %-1:200000 IJ SOLN
10.0000 mL | Freq: Once | INTRAMUSCULAR | Status: AC
  Administered 2018-12-30: 10 mL via INTRADERMAL
  Filled 2018-12-30: qty 10

## 2018-12-30 MED ORDER — SODIUM CHLORIDE 0.9% FLUSH
3.0000 mL | Freq: Two times a day (BID) | INTRAVENOUS | Status: DC
  Administered 2018-12-31: 22:00:00 3 mL via INTRAVENOUS

## 2018-12-30 MED ORDER — PROPOFOL 10 MG/ML IV BOLUS
INTRAVENOUS | Status: AC
  Filled 2018-12-30: qty 20

## 2018-12-30 MED ORDER — ACETAMINOPHEN 325 MG PO TABS: 650.0000 mg | ORAL_TABLET | Freq: Four times a day (QID) | ORAL | Status: DC | PRN

## 2018-12-30 MED ORDER — ONDANSETRON HCL 4 MG/2ML IJ SOLN: 4.0000 mg | Freq: Four times a day (QID) | INTRAMUSCULAR | Status: DC | PRN

## 2018-12-30 MED ORDER — CHLORHEXIDINE GLUCONATE 4 % EX LIQD
60.0000 mL | Freq: Once | CUTANEOUS | Status: DC
  Administered 2018-12-30: 4 via TOPICAL

## 2018-12-30 MED ORDER — ACETAMINOPHEN 650 MG RE SUPP: 650.0000 mg | Freq: Four times a day (QID) | RECTAL | Status: DC | PRN

## 2018-12-30 MED ORDER — HYDRALAZINE HCL 50 MG PO TABS
50.0000 mg | ORAL_TABLET | Freq: Three times a day (TID) | ORAL | Status: DC
  Administered 2018-12-31 – 2019-01-02 (×9): 50 mg via ORAL
  Filled 2018-12-30 (×9): qty 1

## 2018-12-30 MED ORDER — HYDROCODONE-ACETAMINOPHEN 5-325 MG PO TABS
1.0000 | ORAL_TABLET | ORAL | Status: DC | PRN
  Administered 2018-12-31: 2 via ORAL
  Filled 2018-12-30: qty 2
  Filled 2018-12-30: qty 1

## 2018-12-30 MED ORDER — ACETAMINOPHEN 500 MG PO TABS
1000.0000 mg | ORAL_TABLET | Freq: Once | ORAL | Status: AC
  Administered 2018-12-30: 19:00:00 1000 mg via ORAL
  Filled 2018-12-30: qty 2

## 2018-12-30 MED ORDER — INSULIN GLARGINE 100 UNIT/ML ~~LOC~~ SOLN
15.0000 [IU] | Freq: Every day | SUBCUTANEOUS | Status: DC
  Administered 2018-12-31 – 2019-01-01 (×3): 15 [IU] via SUBCUTANEOUS
  Filled 2018-12-30 (×4): qty 0.15

## 2018-12-30 MED ORDER — MIDAZOLAM HCL 2 MG/2ML IJ SOLN
INTRAMUSCULAR | Status: AC
  Filled 2018-12-30: qty 2

## 2018-12-30 MED ORDER — SENNOSIDES-DOCUSATE SODIUM 8.6-50 MG PO TABS: 1.0000 | ORAL_TABLET | Freq: Every evening | ORAL | Status: DC | PRN

## 2018-12-30 MED ORDER — AMLODIPINE BESYLATE 10 MG PO TABS
10.0000 mg | ORAL_TABLET | Freq: Every day | ORAL | Status: DC
  Administered 2018-12-31 – 2019-01-02 (×3): 10 mg via ORAL
  Filled 2018-12-30 (×3): qty 1

## 2018-12-30 MED ORDER — POVIDONE-IODINE 10 % EX SWAB: 2.0000 "application " | Freq: Once | CUTANEOUS | Status: DC

## 2018-12-30 MED ORDER — INSULIN ASPART 100 UNIT/ML ~~LOC~~ SOLN
0.0000 [IU] | SUBCUTANEOUS | Status: DC
  Administered 2018-12-31 (×2): 1 [IU] via SUBCUTANEOUS
  Administered 2018-12-31: 12:00:00 3 [IU] via SUBCUTANEOUS

## 2018-12-30 MED ORDER — PANTOPRAZOLE SODIUM 40 MG PO TBEC
40.0000 mg | DELAYED_RELEASE_TABLET | Freq: Two times a day (BID) | ORAL | Status: DC
  Administered 2018-12-31 – 2019-01-02 (×6): 40 mg via ORAL
  Filled 2018-12-30 (×6): qty 1

## 2018-12-30 MED ORDER — OXYCODONE HCL 5 MG PO TABS
5.0000 mg | ORAL_TABLET | Freq: Once | ORAL | Status: AC
  Administered 2018-12-30: 5 mg via ORAL
  Filled 2018-12-30: qty 1

## 2018-12-30 MED ORDER — SODIUM CHLORIDE 0.9% FLUSH: 3.0000 mL | INTRAVENOUS | Status: DC | PRN

## 2018-12-30 MED ORDER — FENTANYL CITRATE (PF) 100 MCG/2ML IJ SOLN
INTRAMUSCULAR | Status: AC
  Filled 2018-12-30: qty 2

## 2018-12-30 MED ORDER — METOPROLOL TARTRATE 50 MG PO TABS
100.0000 mg | ORAL_TABLET | Freq: Two times a day (BID) | ORAL | Status: DC
  Administered 2018-12-31 – 2019-01-02 (×6): 100 mg via ORAL
  Filled 2018-12-30 (×6): qty 2

## 2018-12-30 MED ORDER — SODIUM CHLORIDE 0.9 % IR SOLN
Status: DC | PRN
  Administered 2018-12-30: 3000 mL

## 2018-12-30 MED ORDER — SODIUM CHLORIDE 0.9 % IV SOLN
INTRAVENOUS | Status: AC
  Filled 2018-12-30: qty 500000

## 2018-12-30 MED ORDER — ONDANSETRON HCL 4 MG PO TABS: 4.0000 mg | ORAL_TABLET | Freq: Four times a day (QID) | ORAL | Status: DC | PRN

## 2018-12-30 MED ORDER — KETOROLAC TROMETHAMINE 15 MG/ML IJ SOLN: 15.0000 mg | Freq: Four times a day (QID) | INTRAMUSCULAR | Status: DC | PRN

## 2018-12-30 SURGICAL SUPPLY — 25 items
ADAPTER CBCII HEMOVAC (AUTOTRANSFUSION) ×2 IMPLANT
BNDG CONFORM 6X.82 1P STRL (GAUZE/BANDAGES/DRESSINGS) ×1 IMPLANT
CHLORAPREP W/TINT 26 (MISCELLANEOUS) ×1 IMPLANT
COVER SURGICAL LIGHT HANDLE (MISCELLANEOUS) ×1 IMPLANT
CUFF TOURN SGL QUICK 24 (TOURNIQUET CUFF) ×1
CUFF TRNQT CYL 24X4X16.5-23 (TOURNIQUET CUFF) IMPLANT
DRAPE HALF SHEET 40X57 (DRAPES) ×1 IMPLANT
ELECT PENCIL ROCKER SW 15FT (MISCELLANEOUS) ×1 IMPLANT
ELECT REM PT RETURN 9FT ADLT (ELECTROSURGICAL) ×2
ELECTRODE REM PT RTRN 9FT ADLT (ELECTROSURGICAL) IMPLANT
GAUZE SPONGE 4X4 12PLY STRL (GAUZE/BANDAGES/DRESSINGS) ×1 IMPLANT
GAUZE XEROFORM 1X8 LF (GAUZE/BANDAGES/DRESSINGS) ×1 IMPLANT
GLOVE BIO SURGEON STRL SZ 6.5 (GLOVE) ×1 IMPLANT
GLOVE BIO SURGEON STRL SZ7.5 (GLOVE) ×1 IMPLANT
GLOVE BIO SURGEON STRL SZ8 (GLOVE) ×1 IMPLANT
GLOVE INDICATOR 6.5 STRL GRN (GLOVE) ×1 IMPLANT
GLOVE INDICATOR 8.0 STRL GRN (GLOVE) ×1 IMPLANT
NDL HYPO 21X1.5 SAFETY (NEEDLE) IMPLANT
NEEDLE HYPO 21X1.5 SAFETY (NEEDLE) ×2 IMPLANT
PACK BASIC VI WITH GOWN DISP (CUSTOM PROCEDURE TRAY) ×2 IMPLANT
SPONGE LAP 18X18 X RAY DECT (DISPOSABLE) ×1 IMPLANT
SUT PROLENE 4 0 PS 2 18 (SUTURE) ×2 IMPLANT
SYR BULB IRRIGATION 50ML (SYRINGE) ×1 IMPLANT
TOWEL OR 17X26 10 PK STRL BLUE (TOWEL DISPOSABLE) ×1 IMPLANT
YANKAUER SUCT BULB TIP NO VENT (SUCTIONS) ×1 IMPLANT

## 2018-12-30 NOTE — ED Notes (Signed)
ED TO INPATIENT HANDOFF REPORT  ED Nurse Name and Phone #: jon wled   S Name/Age/Gender [name] 48 y.o. male Room/Bed: WA11/WA11  Code Status   Code Status: Prior  Home/SNF/Other  {Patient oriented home  Is this baseline? Yes   Triage Complete: Triage complete  Chief Complaint Right elbow pain  Triage Note Patient c/o right elbow pain since 0100 today. Denies injury. Reports pain worsens with movement. Hx gout.   Allergies No Known Allergies  Level of Care/Admitting Diagnosis ED Disposition    ED Disposition Condition Comment   Admit  Hospital Area: [place] [[id number]]  Level of Care: Med-Surg [16]  Covid Evaluation: Screening Protocol (No Symptoms)  Diagnosis: Septic arthritis of elbow, right [facility]) [[id number]]  Admitting Physician: Vianne Bulls [[id number]]  Attending Physician: Vianne Bulls [[id number]]  PT Class (Do Not Modify): Observation [104]  PT Acc Code (Do Not Modify): Observation [10022]       B Medical/Surgery History Past Medical History:  Diagnosis Date  . Acute blood loss as cause of postoperative anemia 04/18/2017  . ARF (acute renal failure) ([place])   . Cellulitis of right lower leg 03/31/2017  . Chronic anticoagulation 03/31/2017  . Diabetes mellitus without complication ([place])   . Diabetic ulcer of calf ([place]) 03/31/2017  . DVT of proximal lower limb ([place]) 03/20/2017   R calf area  . Escherichia coli sepsis ([place]) 04/18/2017  . Gout   . History of hiatal hernia   . Hypertension   . Hypoalbuminemia 03/31/2017  . Necrotizing fasciitis ([place])   . Right leg DVT ([place]) 03/31/2017  . Sepsis ([place]) 03/30/2017  . Sinus tachycardia 03/31/2017  . Type 2 diabetes mellitus with stage 2 chronic kidney disease, with long-term current use of insulin ([place]) 03/27/2017   Past Surgical History:  Procedure Laterality Date  . COLONOSCOPY WITH PROPOFOL N/A 02/10/2018   Procedure: COLONOSCOPY WITH PROPOFOL;  Surgeon: Lin Landsman, MD;   Location[facility] ENDOSCOPY;  Service: Gastroenterology;  Laterality: N/A;  . ESOPHAGOGASTRODUODENOSCOPY (EGD) WITH PROPOFOL N/A 02/10/2018   Procedure: ESOPHAGOGASTRODUODENOSCOPY (EGD) WITH PROPOFOL;  Surgeon: Lin Landsman, MD;  Location[facility] ENDOSCOPY;  Service: Gastroenterology;  Laterality: N/A;  . I&D EXTREMITY Right 04/03/2017   Procedure: IRRIGATION AND DEBRIDEMENT RIGHT LEG, APPLY WOUND VAC;  Surgeon: Newt Minion, MD;  Location: [place];  Service: Orthopedics;  Laterality: Right;  . I&D EXTREMITY Right 04/05/2017   Procedure: IRRIGATION AND DEBRIDEMENT RIGHT LEG;  Surgeon: Newt Minion, MD;  Location: [place];  Service: Orthopedics;  Laterality: Right;  . SCROTAL SURGERY  2006  . SKIN SPLIT GRAFT Right 04/10/2017   Procedure: SKIN GRAFT SPLIT THICKNESS RIGHT LEG;  Surgeon: Newt Minion, MD;  Location: [place];  Service: Orthopedics;  Laterality: Right;     A IV Location/Drains/Wounds Patient Lines/Drains/Airways Status   Active Line/Drains/Airways    Name:   Placement date:   Placement time:   Site:   Days:   Peripheral IV 12/30/18 Left Hand   12/30/18    2232    Hand   less than 1   PICC Single Lumen 04/08/17 PICC Right Brachial 42 cm 0 cm   04/08/17    1200    Brachial   631   Negative Pressure Wound Therapy Leg Right;Lateral   04/05/17    1140    -   634   Incision (Closed) 04/05/17 Leg   04/05/17    1147     634  Incision (Closed) 04/05/17 Leg Right   04/05/17    1153     634   Incision (Closed) 04/10/17 Leg Right   04/10/17    0958     629          Intake/Output Last 24 hours No intake or output data in the 24 hours ending 12/30/18 2244  Labs/Imaging Results for orders placed or performed during the hospital encounter of 12/30/18 (from the past 48 hour(s))  CBC with Differential     Status: None   Collection Time: 12/30/18  6:38 PM  Result Value Ref Range   WBC 8.7 4.0 - 10.5 K/uL   RBC 5.41 4.22 - 5.81 MIL/uL   Hemoglobin 16.1 13.0 - 17.0 g/dL   HCT 49.6 39.0 -  52.0 %   MCV 91.7 80.0 - 100.0 fL   MCH 29.8 26.0 - 34.0 pg   MCHC 32.5 30.0 - 36.0 g/dL   RDW 13.1 11.5 - 15.5 %   Platelets 258 150 - 400 K/uL   nRBC 0.0 0.0 - 0.2 %   Neutrophils Relative % 75 %   Neutro Abs 6.5 1.7 - 7.7 K/uL   Lymphocytes Relative 18 %   Lymphs Abs 1.5 0.7 - 4.0 K/uL   Monocytes Relative 7 %   Monocytes Absolute 0.6 0.1 - 1.0 K/uL   Eosinophils Relative 0 %   Eosinophils Absolute 0.0 0.0 - 0.5 K/uL   Basophils Relative 0 %   Basophils Absolute 0.0 0.0 - 0.1 K/uL   Immature Granulocytes 0 %   Abs Immature Granulocytes 0.02 0.00 - 0.07 K/uL    Comment: Performed at [facility], [place] [address] [place], [place] [address]  Basic metabolic panel     Status: Abnormal   Collection Time: 12/30/18  6:38 PM  Result Value Ref Range   Sodium 138 135 - 145 mmol/L   Potassium 4.2 3.5 - 5.1 mmol/L   Chloride 104 98 - 111 mmol/L   CO2 21 (L) 22 - 32 mmol/L   Glucose, Bld 162 (H) 70 - 99 mg/dL   BUN 18 6 - 20 mg/dL   Creatinine, Ser 1.32 (H) 0.61 - 1.24 mg/dL   Calcium 9.3 8.9 - 10.3 mg/dL   GFR calc non Af Amer >60 >60 mL/min   GFR calc Af Amer >60 >60 mL/min   Anion gap 13 5 - 15    Comment: Performed at [facility], [place] [address], [place], [place] [address]  Sedimentation rate     Status: Abnormal   Collection Time: 12/30/18  6:38 PM  Result Value Ref Range   Sed Rate 18 (H) 0 - 16 mm/hr    Comment: Performed at [facility], [place] [address] [place], [place] [address]  C-reactive protein     Status: Abnormal   Collection Time: 12/30/18  6:38 PM  Result Value Ref Range   CRP 5.4 (H) <1.0 mg/dL    Comment: Performed at [facility], [place] [address], [place], [place] [address]  Body fluid culture     Status: None (Preliminary result)   Collection Time: 12/30/18  7:15 PM   Specimen: Synovium; Body Fluid  Result Value Ref Range   Specimen Description      SYNOVIAL Performed at [place] [address], [place], [place] [address]    Special Requests      SYRINGE Performed at [facility], [place] [address]  [place], [place] [address]    Gram Stain      ABUNDANT WBC PRESENT, PREDOMINANTLY PMN NO ORGANISMS SEEN Gram Stain Report Called to,Read Back By and Verified With: RN [facility] AT 2147 12/30/18 CRUICKSHANK A Performed at [facility], [place] [address], [place], [place] [address]    Culture PENDING    Report Status PENDING   Synovial cell count + diff, w/ crystals     Status: Abnormal   Collection Time: 12/30/18  7:15 PM  Result Value Ref Range   Color, Synovial YELLOW YELLOW   Appearance-Synovial CLOUDY (A) CLEAR   Crystals, Fluid NO CRYSTALS SEEN    WBC, Synovial 64,640 (H) 0 - 200 /cu mm   Neutrophil, Synovial 93 (H) 0 - 25 %   Lymphocytes-Synovial Fld 0 0 - 20 %   Monocyte-Macrophage-Synovial Fluid 7 (L) 50 - 90 %   Eosinophils-Synovial 0 0 - 1 %    Comment: Performed at [facility], [place] [address], [place], [place] [address]   Dg Elbow Complete Right  Result Date: 12/30/2018 CLINICAL DATA:  Right elbow pain EXAM: RIGHT ELBOW - COMPLETE 3+ VIEW COMPARISON:  None. FINDINGS: There is no acute displaced fracture or dislocation. There is some mild soft tissue swelling about the posterior aspect of the elbow. There is a small joint effusion. There is no radiopaque foreign body. IMPRESSION: 1. No acute displaced fracture or dislocation. 2. Soft tissue swelling about the elbow. 3. Small joint effusion. Electronically Signed   By: Constance Holster M.D.   On: 12/30/2018 19:33    Pending Labs Unresulted Labs (From admission, onward)    Start     Ordered   12/30/18 2139  SARS Coronavirus 2 (CEPHEID - Performed in [place] hospital lab), [facility] Order  (Asymptomatic Patients Labs)  ONCE - STAT,   STAT    Question:  Rule Out  Answer:  Yes   12/30/18 2138   12/30/18 1838  Glucose, Body Fluid Other   (Arthrocentesis Panel)  ONCE - STAT,   STAT     12/30/18 1838   12/30/18 1838  Protein, body fluid (other)  (Arthrocentesis Panel)  ONCE - STAT,   STAT     12/30/18 1838   Signed and Held  HIV antibody (Routine Testing)  Tomorrow morning,   R     Signed and Held   Signed and Held  Basic metabolic panel  Tomorrow morning,   R     Signed and Held   Signed and Held  CBC WITH DIFFERENTIAL  Tomorrow morning,   R     Signed and Held   Signed and Held  C-reactive protein  Daily,   R     Signed and Held          Vitals/Pain Today's Vitals   12/30/18 1810 12/30/18 1845 12/30/18 2113 12/30/18 2125  BP:  (!) 169/100  (!) 163/100  Pulse:  73  72  Resp:  18  15  Temp:    98.7 F (37.1 C)  TempSrc:    Oral  SpO2:  100%  99%  Weight:      Height:      PainSc: 10-Worst pain ever  8  6     Isolation Precautions No active isolations  Medications Medications  acetaminophen (TYLENOL) tablet 1,000 mg (1,000 mg Oral Given 12/30/18 1902)  oxyCODONE (Oxy IR/ROXICODONE) immediate release tablet 5 mg (5 mg Oral Given 12/30/18 1902)  lidocaine-EPINEPHrine (XYLOCAINE W/EPI) 2 %-1:200000 (  PF) injection 10 mL (10 mLs Intradermal Given 12/30/18 1903)  fentaNYL (SUBLIMAZE) 100 MCG/2ML injection (has no administration in time range)  midazolam (VERSED) 2 MG/2ML injection (has no administration in time range)  propofol (DIPRIVAN) 10 mg/mL bolus/IV push (has no administration in time range)    Mobility walks Low fall risk   Focused Assessment    R Recommendations: See Admitting Provider Note  Report given to:   Additional Notes:                     ED TO INPATIENT HANDOFF REPORT  ED Nurse Name and Phone #: jon wled   S Name/Age/Gender [name] 48 y.o. male Room/Bed: WA11/WA11  Code Status   Code Status: Prior  Home/SNF/Other Home  Patient oriented to: self Is this baseline yes   Triage Complete: Triage complete  Chief Complaint Right elbow  pain  Triage Note Patient c/o right elbow pain since 0100 today. Denies injury. Reports pain worsens with movement. Hx gout.   Allergies No Known Allergies  Level of Care/Admitting Diagnosis ED Disposition    ED Disposition Condition Comment   Admit  Hospital Area: [place] [[id number]]  Level of Care: Med-Surg [16]  Covid Evaluation: Screening Protocol (No Symptoms)  Diagnosis: Septic arthritis of elbow, right [facility]) [[id number]]  Admitting Physician: Vianne Bulls [[id number]]  Attending Physician: Vianne Bulls [[id number]]  PT Class (Do Not Modify): Observation [104]  PT Acc Code (Do Not Modify): Observation [10022]       B Medical/Surgery History Past Medical History:  Diagnosis Date  . Acute blood loss as cause of postoperative anemia 04/18/2017  . ARF (acute renal failure) ([place])   . Cellulitis of right lower leg 03/31/2017  . Chronic anticoagulation 03/31/2017  . Diabetes mellitus without complication ([place])   . Diabetic ulcer of calf ([place]) 03/31/2017  . DVT of proximal lower limb ([place]) 03/20/2017   R calf area  . Escherichia coli sepsis ([place]) 04/18/2017  . Gout   . History of hiatal hernia   . Hypertension   . Hypoalbuminemia 03/31/2017  . Necrotizing fasciitis ([place])   . Right leg DVT ([place]) 03/31/2017  . Sepsis ([place]) 03/30/2017  . Sinus tachycardia 03/31/2017  . Type 2 diabetes mellitus with stage 2 chronic kidney disease, with long-term current use of insulin ([place]) 03/27/2017   Past Surgical History:  Procedure Laterality Date  . COLONOSCOPY WITH PROPOFOL N/A 02/10/2018   Procedure: COLONOSCOPY WITH PROPOFOL;  Surgeon: Lin Landsman, MD;  Location: [facility] ENDOSCOPY;  Service: Gastroenterology;  Laterality: N/A;  . ESOPHAGOGASTRODUODENOSCOPY (EGD) WITH PROPOFOL N/A 02/10/2018   Procedure: ESOPHAGOGASTRODUODENOSCOPY (EGD) WITH PROPOFOL;  Surgeon: Lin Landsman, MD;  Location: [facility] ENDOSCOPY;  Service: Gastroenterology;  Laterality: N/A;  . I&D  EXTREMITY Right 04/03/2017   Procedure: IRRIGATION AND DEBRIDEMENT RIGHT LEG, APPLY WOUND VAC;  Surgeon: Newt Minion, MD;  Location: [place];  Service: Orthopedics;  Laterality: Right;  . I&D EXTREMITY Right 04/05/2017   Procedure: IRRIGATION AND DEBRIDEMENT RIGHT LEG;  Surgeon: Newt Minion, MD;  Location: [place];  Service: Orthopedics;  Laterality: Right;  . SCROTAL SURGERY  2006  . SKIN SPLIT GRAFT Right 04/10/2017   Procedure: SKIN GRAFT SPLIT THICKNESS RIGHT LEG;  Surgeon: Newt Minion, MD;  Location: [place];  Service: Orthopedics;  Laterality: Right;     A IV Location/Drains/Wounds Patient Lines/Drains/Airways Status   Active Line/Drains/Airways    Name:   Placement date:  Placement time:   Site:   Days:   Peripheral IV 12/30/18 Left Hand   12/30/18    2232    Hand   less than 1   PICC Single Lumen 04/08/17 PICC Right Brachial 42 cm 0 cm   04/08/17    1200    Brachial   631   Negative Pressure Wound Therapy Leg Right;Lateral   04/05/17    1140    -   634   Incision (Closed) 04/05/17 Leg   04/05/17    1147     634   Incision (Closed) 04/05/17 Leg Right   04/05/17    1153     634   Incision (Closed) 04/10/17 Leg Right   04/10/17    0958     629          Intake/Output Last 24 hours No intake or output data in the 24 hours ending 12/30/18 2244  Labs/Imaging Results for orders placed or performed during the hospital encounter of 12/30/18 (from the past 48 hour(s))  CBC with Differential     Status: None   Collection Time: 12/30/18  6:38 PM  Result Value Ref Range   WBC 8.7 4.0 - 10.5 K/uL   RBC 5.41 4.22 - 5.81 MIL/uL   Hemoglobin 16.1 13.0 - 17.0 g/dL   HCT 49.6 39.0 - 52.0 %   MCV 91.7 80.0 - 100.0 fL   MCH 29.8 26.0 - 34.0 pg   MCHC 32.5 30.0 - 36.0 g/dL   RDW 13.1 11.5 - 15.5 %   Platelets 258 150 - 400 K/uL   nRBC 0.0 0.0 - 0.2 %   Neutrophils Relative % 75 %   Neutro Abs 6.5 1.7 - 7.7 K/uL   Lymphocytes Relative 18 %   Lymphs Abs 1.5 0.7 - 4.0 K/uL   Monocytes  Relative 7 %   Monocytes Absolute 0.6 0.1 - 1.0 K/uL   Eosinophils Relative 0 %   Eosinophils Absolute 0.0 0.0 - 0.5 K/uL   Basophils Relative 0 %   Basophils Absolute 0.0 0.0 - 0.1 K/uL   Immature Granulocytes 0 %   Abs Immature Granulocytes 0.02 0.00 - 0.07 K/uL    Comment: Performed at [facility], [place] [address] [place], [place] [address]  Basic metabolic panel     Status: Abnormal   Collection Time: 12/30/18  6:38 PM  Result Value Ref Range   Sodium 138 135 - 145 mmol/L   Potassium 4.2 3.5 - 5.1 mmol/L   Chloride 104 98 - 111 mmol/L   CO2 21 (L) 22 - 32 mmol/L   Glucose, Bld 162 (H) 70 - 99 mg/dL   BUN 18 6 - 20 mg/dL   Creatinine, Ser 1.32 (H) 0.61 - 1.24 mg/dL   Calcium 9.3 8.9 - 10.3 mg/dL   GFR calc non Af Amer >60 >60 mL/min   GFR calc Af Amer >60 >60 mL/min   Anion gap 13 5 - 15    Comment: Performed at [facility], [place] [address] [place], [place] [address]  Sedimentation rate     Status: Abnormal   Collection Time: 12/30/18  6:38 PM  Result Value Ref Range   Sed Rate 18 (H) 0 - 16 mm/hr    Comment: Performed at [facility], [place] [address], [place], [place] [address]  C-reactive protein     Status: Abnormal   Collection Time: 12/30/18  6:38 PM  Result Value Ref Range  CRP 5.4 (H) <1.0 mg/dL    Comment: Performed at [facility], [place] [address], [place], [place] [address]  Body fluid culture     Status: None (Preliminary result)   Collection Time: 12/30/18  7:15 PM   Specimen: Synovium; Body Fluid  Result Value Ref Range   Specimen Description      SYNOVIAL Performed at [place] [address], [place], [place] [address]    Special Requests      SYRINGE Performed at [facility], [place] [address], [place], [place] [address]    Gram Stain      ABUNDANT WBC PRESENT, PREDOMINANTLY PMN NO ORGANISMS SEEN Gram Stain Report Called to,Read Back By and  Verified With: RN [facility] AT 2147 12/30/18 CRUICKSHANK A Performed at [facility], [place] [address], [place], [place] [address]    Culture PENDING    Report Status PENDING   Synovial cell count + diff, w/ crystals     Status: Abnormal   Collection Time: 12/30/18  7:15 PM  Result Value Ref Range   Color, Synovial YELLOW YELLOW   Appearance-Synovial CLOUDY (A) CLEAR   Crystals, Fluid NO CRYSTALS SEEN    WBC, Synovial 64,640 (H) 0 - 200 /cu mm   Neutrophil, Synovial 93 (H) 0 - 25 %   Lymphocytes-Synovial Fld 0 0 - 20 %   Monocyte-Macrophage-Synovial Fluid 7 (L) 50 - 90 %   Eosinophils-Synovial 0 0 - 1 %    Comment: Performed at [facility], [place] [address] [place],  [address]   Dg Elbow Complete Right  Result Date: 12/30/2018 CLINICAL DATA:  Right elbow pain EXAM: RIGHT ELBOW - COMPLETE 3+ VIEW COMPARISON:  None. FINDINGS: There is no acute displaced fracture or dislocation. There is some mild soft tissue swelling about the posterior aspect of the elbow. There is a small joint effusion. There is no radiopaque foreign body. IMPRESSION: 1. No acute displaced fracture or dislocation. 2. Soft tissue swelling about the elbow. 3. Small joint effusion. Electronically Signed   By: Constance Holster M.D.   On: 12/30/2018 19:33    Pending Labs Unresulted Labs (From admission, onward)    Start     Ordered   12/30/18 2139  SARS Coronavirus 2 (CEPHEID - Performed in [place] hospital lab), [facility] Order  (Asymptomatic Patients Labs)  ONCE - STAT,   STAT    Question:  Rule Out  Answer:  Yes   12/30/18 2138   12/30/18 1838  Glucose, Body Fluid Other  (Arthrocentesis Panel)  ONCE - STAT,   STAT     12/30/18 1838   12/30/18 1838  Protein, body fluid (other)  (Arthrocentesis Panel)  ONCE - STAT,   STAT     12/30/18 1838   Signed and Held  HIV antibody (Routine Testing)  Tomorrow morning,   R     Signed and Held   Signed and Held  Basic metabolic panel   Tomorrow morning,   R     Signed and Held   Signed and Held  CBC WITH DIFFERENTIAL  Tomorrow morning,   R     Signed and Held   Signed and Held  C-reactive protein  Daily,   R     Signed and Held          Vitals/Pain Today's Vitals   12/30/18 1810 12/30/18 1845 12/30/18 2113 12/30/18 2125  BP:  (!) 169/100  (!) 163/100  Pulse:  73  72  Resp:  18  15  Temp:    98.7 F (37.1 C)  TempSrc:    Oral  SpO2:  100%  99%  Weight:      Height:      PainSc: 10-Worst pain ever  8  6     Isolation Precautions No active isolations  Medications Medications  acetaminophen (TYLENOL) tablet 1,000 mg (1,000 mg Oral Given 12/30/18 1902)  oxyCODONE (Oxy IR/ROXICODONE) immediate release tablet 5 mg (5 mg Oral Given 12/30/18 1902)  lidocaine-EPINEPHrine (XYLOCAINE W/EPI) 2 %-1:200000 (PF) injection 10 mL (10 mLs Intradermal Given 12/30/18 1903)  fentaNYL (SUBLIMAZE) 100 MCG/2ML injection (has no administration in time range)  midazolam (VERSED) 2 MG/2ML injection (has no administration in time range)  propofol (DIPRIVAN) 10 mg/mL bolus/IV push (has no administration in time range)    Mobility walks Low fall risk   Focused Assessments    R Recommendations: See Admitting Provider Note  Report given to:   Additional Notes:

## 2018-12-30 NOTE — H&P (Signed)
ORTHOPAEDIC CONSULTATION  REQUESTING PHYSICIAN: Deno Etienne, DO  PCP:  Tracie Harrier, MD  Chief Complaint: Right elbow pain  HPI: [name] is a 48 y.o. male who complains of right elbow pain.  He states that early this morning he woke up with a significant right elbow pain.  Since then it has progressed and he has noticed progressive loss of range of motion.  He denies any trauma or injury.  He does have a significant past medical history including insulin-dependent diabetes mellitus, chronic kidney disease, chronic right leg wound.  Work-up in the ER found a right elbow effusion and subsequent aspirate of this had 65,000 WBCs which is consistent with a right elbow septic arthritis.  Hand surgery was then called for further recommendations and treatment options.  On exam he continues to complain of right elbow pain though it has improved some since the aspirate.  He denies numbness or tingling.  Is no pain elsewhere in the body.  Past Medical History:  Diagnosis Date  . Acute blood loss as cause of postoperative anemia 04/18/2017  . ARF (acute renal failure) ([place])   . Cellulitis of right lower leg 03/31/2017  . Chronic anticoagulation 03/31/2017  . Diabetes mellitus without complication ([place])   . Diabetic ulcer of calf ([place]) 03/31/2017  . DVT of proximal lower limb ([place]) 03/20/2017   R calf area  . Escherichia coli sepsis ([place]) 04/18/2017  . Gout   . History of hiatal hernia   . Hypertension   . Hypoalbuminemia 03/31/2017  . Necrotizing fasciitis ([place])   . Right leg DVT ([place]) 03/31/2017  . Sepsis ([place]) 03/30/2017  . Sinus tachycardia 03/31/2017  . Type 2 diabetes mellitus with stage 2 chronic kidney disease, with long-term current use of insulin ([place]) 03/27/2017   Past Surgical History:  Procedure Laterality Date  . COLONOSCOPY WITH PROPOFOL N/A 02/10/2018   Procedure: COLONOSCOPY WITH PROPOFOL;  Surgeon: Lin Landsman, MD;  Location: [facility] ENDOSCOPY;  Service:  Gastroenterology;  Laterality: N/A;  . ESOPHAGOGASTRODUODENOSCOPY (EGD) WITH PROPOFOL N/A 02/10/2018   Procedure: ESOPHAGOGASTRODUODENOSCOPY (EGD) WITH PROPOFOL;  Surgeon: Lin Landsman, MD;  Location: [facility] ENDOSCOPY;  Service: Gastroenterology;  Laterality: N/A;  . I&D EXTREMITY Right 04/03/2017   Procedure: IRRIGATION AND DEBRIDEMENT RIGHT LEG, APPLY WOUND VAC;  Surgeon: Newt Minion, MD;  Location: [facility];  Service: Orthopedics;  Laterality: Right;  . I&D EXTREMITY Right 04/05/2017   Procedure: IRRIGATION AND DEBRIDEMENT RIGHT LEG;  Surgeon: Newt Minion, MD;  Location: [place];  Service: Orthopedics;  Laterality: Right;  . SCROTAL SURGERY  2006  . SKIN SPLIT GRAFT Right 04/10/2017   Procedure: SKIN GRAFT SPLIT THICKNESS RIGHT LEG;  Surgeon: Newt Minion, MD;  Location: [place];  Service: Orthopedics;  Laterality: Right;   Social History   Socioeconomic History  . Marital status: Single    Spouse name: Not on file  . Number of children: Not on file  . Years of education: Not on file  . Highest education level: Not on file  Occupational History  . Not on file  Social Needs  . Financial resource strain: Not on file  . Food insecurity    Worry: Not on file    Inability: Not on file  . Transportation needs    Medical: Not on file    Non-medical: Not on file  Tobacco Use  . Smoking status: Never Smoker  . Smokeless tobacco: Never Used  Substance and Sexual Activity  .  Alcohol use: No  . Drug use: No  . Sexual activity: Never  Lifestyle  . Physical activity    Days per week: Not on file    Minutes per session: Not on file  . Stress: Not on file  Relationships  . Social Herbalist on phone: Not on file    Gets together: Not on file    Attends religious service: Not on file    Active member of club or organization: Not on file    Attends meetings of clubs or organizations: Not on file    Relationship status: Not on file  Other Topics Concern  . Not on file   Social History Narrative  . Not on file   Family History  Problem Relation Age of Onset  . Hypertension Mother   . Stroke Mother   . Diabetes Father   . Renal Disease Father   . Hypertension Sister   . Diabetes Sister   . Renal Disease Sister   . Iron deficiency Sister    No Known Allergies Prior to Admission medications   Medication Sig Start Date End Date Taking? Authorizing Provider  amLODipine (NORVASC) 10 MG tablet Take 10 mg by mouth daily.   Yes [provider]  cyclobenzaprine (FLEXERIL) 5 MG tablet Take 1 tablet (5 mg total) by mouth 3 (three) times daily as needed for muscle spasms. 04/12/17  Yes Hosie Poisson, MD  hydrALAZINE (APRESOLINE) 50 MG tablet Take 50 mg by mouth 3 (three) times daily.   Yes [provider]  hydrochlorothiazide (HYDRODIURIL) 25 MG tablet Take 25 mg by mouth daily.  05/08/18  Yes [provider]  HYDROcodone-acetaminophen (NORCO/VICODIN) 5-325 MG tablet Take 1 tablet by mouth every 4 (four) hours as needed for moderate pain.  03/19/18  Yes [provider]  insulin aspart (NOVOLOG FLEXPEN) 100 UNIT/ML FlexPen Inject 2-15 Units into the skin 4 (four) times daily. Sliding scale 121-150 2 units; 151-200 3 units; 201-250 5 units; 251-300 8 units; 301-350 11 units; 351-400 15 units, greater than 400 - 15 units, call doctor.   Yes [provider]  insulin glargine (LANTUS) 100 UNIT/ML injection Inject 32 Units into the skin daily.   Yes [provider]  lisinopril (PRINIVIL,ZESTRIL) 40 MG tablet Take 40 mg by mouth daily.  05/09/18  Yes [provider]  metoprolol tartrate (LOPRESSOR) 100 MG tablet Take 1 tablet (100 mg total) by mouth 2 (two) times daily. 04/12/17  Yes Hosie Poisson, MD  omeprazole (PRILOSEC) 40 MG capsule Take 1 capsule (40 mg total) by mouth 2 (two) times daily. 05/23/18 12/30/18 Yes Vanga, Tally Due, MD  rivaroxaban (XARELTO) 20 MG TABS tablet Take 20 mg by mouth daily with supper.    Yes [provider]  ACCU-CHEK FASTCLIX LANCETS MISC U UTD TO CHECK BLOOD SUGAR TID 01/13/18   [provider]  ACCU-CHEK GUIDE test strip USE TO TEST QID UTD 12/13/17   [provider]  glucose blood (ACCU-CHEK COMPACT PLUS) test strip Use 4 (four) times daily Use as instructed. 11/13/17   [provider]   Dg Elbow Complete Right  Result Date: 12/30/2018 CLINICAL DATA:  Right elbow pain EXAM: RIGHT ELBOW - COMPLETE 3+ VIEW COMPARISON:  None. FINDINGS: There is no acute displaced fracture or dislocation. There is some mild soft tissue swelling about the posterior aspect of the elbow. There is a small joint effusion. There is no radiopaque foreign body. IMPRESSION: 1. No acute displaced fracture or  dislocation. 2. Soft tissue swelling about the elbow. 3. Small joint effusion. Electronically Signed   By: Constance Holster M.D.   On: 12/30/2018 19:33    Positive ROS: All other systems have been reviewed and were otherwise negative with the exception of those mentioned in the HPI and as above.  Physical Exam: General: Alert, no acute distress Cardiovascular: No pedal edema Respiratory: No cyanosis, no use of accessory musculature Skin: No lesions in the area of chief complaint Neurologic: Sensation intact distally Psychiatric: Patient is competent for consent with normal mood and affect Lymphatic: No axillary or cervical lymphadenopathy  MUSCULOSKELETAL: Examination of the right upper upper extremity shows a mildly swollen right elbow.  There is no erythema, lymphadenopathy or signs of infection.  Patient has tenderness palpation diffusely about the right elbow.  He has close to zero active or passive ROM of the elbow.  There are no open wounds.  He has no pain elsewhere in the extremity.  His fingertips are all warm and well-perfused with brisk capillary refill.  His sensation is intact light touch throughout all digits.  Assessment: Right elbow septic  arthritis  Plan: I had a long discussion the patient today regarding his right elbow.  The aspirate obtained in the ER is consistent with a right elbow septic arthritis.  I did recommend proceeding forward for urgent right elbow irrigation and debridement.  The risks of surgery were discussed with him which include but are not limited to infection, bleeding, damage to surrounding structures including blood vessels and nerves, pain, stiffness and need for additional procedures.  He will be admitted to the hospitalist service for continued care following the surgery.  He will likely need several week course of antibiotics which we will consult infectious disease for further recommendations.  All of his questions were answered and he was happy with this plan    Verner Mould, MD Cell [phone]   12/30/2018 10:55 PM

## 2018-12-30 NOTE — ED Notes (Signed)
arthrocentesis equipment at bedside, EDP aware.

## 2018-12-30 NOTE — ED Notes (Signed)
EDP at bedside  

## 2018-12-30 NOTE — ED Provider Notes (Signed)
[facility] DEPT Provider Note   CSN: [id number] Arrival date & time: 12/30/18  1800    History   Chief Complaint Chief Complaint  Patient presents with  . Elbow Pain    HPI [name] is a 48 y.o. male.     48 yo M with a chief complaint of right elbow pain.  This started about 1 this morning.  Patient denies any trauma to it.  Denies fevers or chills.  Has a history of diabetes denies any other issues that would cause him to have a depressed immune system.  Pain is worse with movement diffusely about the elbow.  He feels like to him it feels diffusely swollen.  Denies  numbness or tingling to the hand.  He has had gout before in bilateral ankles but never in his elbow.  The history is provided by the patient.  Illness Severity:  Moderate Onset quality:  Gradual Duration:  14 hours Timing:  Constant Progression:  Unchanged Chronicity:  New Associated symptoms: no abdominal pain, no chest pain, no congestion, no diarrhea, no fever, no headaches, no myalgias, no rash, no shortness of breath and no vomiting     Past Medical History:  Diagnosis Date  . Acute blood loss as cause of postoperative anemia 04/18/2017  . ARF (acute renal failure) ([place])   . Cellulitis of right lower leg 03/31/2017  . Chronic anticoagulation 03/31/2017  . Diabetes mellitus without complication ([place])   . Diabetic ulcer of calf ([place]) 03/31/2017  . DVT of proximal lower limb ([place]) 03/20/2017   R calf area  . Escherichia coli sepsis ([place]) 04/18/2017  . Gout   . History of hiatal hernia   . Hypertension   . Hypoalbuminemia 03/31/2017  . Necrotizing fasciitis ([place])   . Right leg DVT ([place]) 03/31/2017  . Sepsis ([place]) 03/30/2017  . Sinus tachycardia 03/31/2017  . Type 2 diabetes mellitus with stage 2 chronic kidney disease, with long-term current use of insulin ([place]) 03/27/2017    Patient Active Problem List   Diagnosis Date Noted  . Septic arthritis of elbow, right  ([place]) 12/30/2018  . History of Helicobacter pylori infection 05/23/2018  . Chronic venous insufficiency 02/06/2018  . Elevated blood pressure reading in office with diagnosis of hypertension 12/17/2017  . Ulcer of right lower extremity with fat layer exposed ([place]) 12/17/2017  . S/P split thickness skin graft 06/28/2017  . ARF (acute renal failure) ([place])   . Diabetic ulcer of ankle ([place])   . Necrotizing fasciitis of lower leg ([place])   . Hyperglycemia due to type 2 diabetes mellitus ([place]) 03/31/2017  . Acute kidney injury superimposed on chronic kidney disease ([place]) 03/31/2017  . Right leg DVT ([place]) 03/31/2017  . Diabetic ulcer of calf ([place]) 03/31/2017  . Noncompliance w/medication treatment due to intermit use of medication 03/31/2017  . Sinus tachycardia 03/31/2017  . Chronic anticoagulation 03/31/2017  . Type 2 diabetes mellitus with stage 2 chronic kidney disease, with long-term current use of insulin ([place]) 03/27/2017  . History of DVT (deep vein thrombosis) 03/27/2017  . Diabetes ([place]) 03/22/2017  . Essential hypertension 01/28/2008    Past Surgical History:  Procedure Laterality Date  . COLONOSCOPY WITH PROPOFOL N/A 02/10/2018   Procedure: COLONOSCOPY WITH PROPOFOL;  Surgeon: Lin Landsman, MD;  Location: [facility] ENDOSCOPY;  Service: Gastroenterology;  Laterality: N/A;  . ESOPHAGOGASTRODUODENOSCOPY (EGD) WITH PROPOFOL N/A 02/10/2018   Procedure: ESOPHAGOGASTRODUODENOSCOPY (EGD) WITH PROPOFOL;  Surgeon: Lin Landsman, MD;  Location: [facility]  ENDOSCOPY;  Service: Gastroenterology;  Laterality: N/A;  . I&D EXTREMITY Right 04/03/2017   Procedure: IRRIGATION AND DEBRIDEMENT RIGHT LEG, APPLY WOUND VAC;  Surgeon: Newt Minion, MD;  Location: [facility];  Service: Orthopedics;  Laterality: Right;  . I&D EXTREMITY Right 04/05/2017   Procedure: IRRIGATION AND DEBRIDEMENT RIGHT LEG;  Surgeon: Newt Minion, MD;  Location: [facility];  Service: Orthopedics;  Laterality: Right;  . SCROTAL SURGERY   2006  . SKIN SPLIT GRAFT Right 04/10/2017   Procedure: SKIN GRAFT SPLIT THICKNESS RIGHT LEG;  Surgeon: Newt Minion, MD;  Location: [place];  Service: Orthopedics;  Laterality: Right;        Home Medications    Prior to Admission medications   Medication Sig Start Date End Date Taking? Authorizing Provider  ACCU-CHEK FASTCLIX LANCETS MISC U UTD TO CHECK BLOOD SUGAR TID 01/13/18   [provider]  ACCU-CHEK GUIDE test strip USE TO TEST QID UTD 12/13/17   [provider]  Amino Acids-Protein Hydrolys (FEEDING SUPPLEMENT, PRO-STAT SUGAR FREE 64,) LIQD Take 30 mLs by mouth 3 (three) times daily between meals. Patient not taking: Reported on 05/23/2018 04/12/17   Hosie Poisson, MD  amLODipine (NORVASC) 10 MG tablet Take 10 mg by mouth daily.    [provider]  cyclobenzaprine (FLEXERIL) 5 MG tablet Take 1 tablet (5 mg total) by mouth 3 (three) times daily as needed for muscle spasms. 04/12/17   Hosie Poisson, MD  ferrous sulfate 325 (65 FE) MG EC tablet Take 325 mg by mouth 3 (three) times daily with meals.    [provider]  glucose blood (ACCU-CHEK COMPACT PLUS) test strip Use 4 (four) times daily Use as instructed. 11/13/17   [provider]  hydrALAZINE (APRESOLINE) 50 MG tablet Take 50 mg by mouth 3 (three) times daily.    [provider]  hydrochlorothiazide (HYDRODIURIL) 25 MG tablet  05/08/18   [provider]  HYDROcodone-acetaminophen (NORCO/VICODIN) 5-325 MG tablet TK 1 T PO QD PRF PAIN 03/19/18   [provider]  insulin aspart (NOVOLOG FLEXPEN) 100 UNIT/ML FlexPen Inject into the skin. Sliding scale 121-150 2 units; 151-200 3 units; 201-250 5 units; 251-300 8 units; 301-350 11 units; 351-400 15 units, grater than 400 - 15 units, call doctor.    [provider]  lisinopril (PRINIVIL,ZESTRIL) 10 MG tablet Take 10 mg by mouth daily.    [provider]  lisinopril (PRINIVIL,ZESTRIL) 20 MG tablet Take by  mouth. 02/27/18 02/27/19  [provider]  lisinopril (PRINIVIL,ZESTRIL) 40 MG tablet  05/09/18   [provider]  metoprolol tartrate (LOPRESSOR) 100 MG tablet Take 1 tablet (100 mg total) by mouth 2 (two) times daily. 04/12/17   Hosie Poisson, MD  omeprazole (PRILOSEC) 40 MG capsule Take 1 capsule (40 mg total) by mouth 2 (two) times daily. 05/23/18 08/21/18  Lin Landsman, MD  rivaroxaban (XARELTO) 20 MG TABS tablet Take 20 mg by mouth daily with supper.    [provider]    Family History Family History  Problem Relation Age of Onset  . Hypertension Mother   . Stroke Mother   . Diabetes Father   . Renal Disease Father   . Hypertension Sister   . Diabetes Sister   . Renal Disease Sister   . Iron deficiency Sister     Social History Social History   Tobacco Use  . Smoking status: Never Smoker  . Smokeless tobacco: Never Used  Substance Use Topics  .  Alcohol use: No  . Drug use: No     Allergies   Patient has no known allergies.   Review of Systems Review of Systems  Constitutional: Negative for chills and fever.  HENT: Negative for congestion and facial swelling.   Eyes: Negative for discharge and visual disturbance.  Respiratory: Negative for shortness of breath.   Cardiovascular: Negative for chest pain and palpitations.  Gastrointestinal: Negative for abdominal pain, diarrhea and vomiting.  Musculoskeletal: Positive for arthralgias. Negative for myalgias.  Skin: Negative for color change and rash.  Neurological: Negative for tremors, syncope and headaches.  Psychiatric/Behavioral: Negative for confusion and dysphoric mood.     Physical Exam Updated Vital Signs BP (!) 163/100 (BP Location: Left Arm)   Pulse 72   Temp 98.7 F (37.1 C) (Oral)   Resp 15   Ht 5\' 11"  (1.803 m)   Wt 115.7 kg   SpO2 99%   BMI 35.57 kg/m   Physical Exam Vitals signs and nursing note reviewed.  Constitutional:      Appearance: He is  well-developed.  HENT:     Head: Normocephalic and atraumatic.  Eyes:     Pupils: Pupils are equal, round, and reactive to light.  Neck:     Musculoskeletal: Normal range of motion and neck supple.     Vascular: No JVD.  Cardiovascular:     Rate and Rhythm: Normal rate and regular rhythm.     Heart sounds: No murmur. No friction rub. No gallop.   Pulmonary:     Effort: No respiratory distress.     Breath sounds: No wheezing.  Abdominal:     General: There is no distension.     Tenderness: There is no guarding or rebound.  Musculoskeletal: Normal range of motion.        General: Tenderness present.     Comments: Patient is holding his right wrist with his left hand so his elbow does not move.  Exquisite tenderness with movement of the elbow.  Effusion palpated.  Pulse motor and sensation are intact distally.  Skin:    Coloration: Skin is not pale.     Findings: No rash.  Neurological:     Mental Status: He is alert and oriented to person, place, and time.  Psychiatric:        Behavior: Behavior normal.      ED Treatments / Results  Labs (all labs ordered are listed, but only abnormal results are displayed) Labs Reviewed  BASIC METABOLIC PANEL - Abnormal; Notable for the following components:      Result Value   CO2 21 (*)    Glucose, Bld 162 (*)    Creatinine, Ser 1.32 (*)    All other components within normal limits  C-REACTIVE PROTEIN - Abnormal; Notable for the following components:   CRP 5.4 (*)    All other components within normal limits  SYNOVIAL CELL COUNT + DIFF, W/ CRYSTALS - Abnormal; Notable for the following components:   Appearance-Synovial CLOUDY (*)    WBC, Synovial 64,640 (*)    Neutrophil, Synovial 93 (*)    Monocyte-Macrophage-Synovial Fluid 7 (*)    All other components within normal limits  BODY FLUID CULTURE  SARS CORONAVIRUS 2 (HOSPITAL ORDER, [facility])  CBC WITH DIFFERENTIAL/PLATELET  SEDIMENTATION RATE   GLUCOSE, BODY FLUID OTHER  PROTEIN, BODY FLUID (OTHER)    EKG None  Radiology Dg Elbow Complete Right  Result Date: 12/30/2018 CLINICAL DATA:  Right elbow pain  EXAM: RIGHT ELBOW - COMPLETE 3+ VIEW COMPARISON:  None. FINDINGS: There is no acute displaced fracture or dislocation. There is some mild soft tissue swelling about the posterior aspect of the elbow. There is a small joint effusion. There is no radiopaque foreign body. IMPRESSION: 1. No acute displaced fracture or dislocation. 2. Soft tissue swelling about the elbow. 3. Small joint effusion. Electronically Signed   By: Constance Holster M.D.   On: 12/30/2018 19:33    Procedures .Joint Aspiration/Arthrocentesis  Date/Time: 12/30/2018 7:40 PM Performed by: Deno Etienne, DO Authorized by: Deno Etienne, DO   Consent:    Consent obtained:  Verbal   Consent given by:  Patient   Risks discussed:  Bleeding, infection, incomplete drainage and pain   Alternatives discussed:  No treatment, delayed treatment and alternative treatment Location:    Location:  Elbow   Elbow:  R elbow Anesthesia (see MAR for exact dosages):    Anesthesia method:  Local infiltration   Local anesthetic:  Lidocaine 2% WITH epi Procedure details:    Preparation: Patient was prepped and draped in usual sterile fashion     Needle gauge: 21.   Ultrasound guidance: no     Approach:  Lateral   Aspirate amount:  12   Aspirate characteristics:  Cloudy and yellow   Steroid injected: no     Specimen collected: yes   Post-procedure details:    Dressing:  Adhesive bandage   Patient tolerance of procedure:  Tolerated well, no immediate complications   (including critical care time)  Medications Ordered in ED Medications  acetaminophen (TYLENOL) tablet 1,000 mg (1,000 mg Oral Given 12/30/18 1902)  oxyCODONE (Oxy IR/ROXICODONE) immediate release tablet 5 mg (5 mg Oral Given 12/30/18 1902)  lidocaine-EPINEPHrine (XYLOCAINE W/EPI) 2 %-1:200000 (PF)  injection 10 mL (10 mLs Intradermal Given 12/30/18 1903)     Initial Impression / Assessment and Plan / ED Course  I have reviewed the triage vital signs and the nursing notes.  Pertinent labs & imaging results that were available during my care of the patient were reviewed by me and considered in my medical decision making (see chart for details).        48 yo M with a chief complaint of right elbow pain.  Nontraumatic.  Concern for possible septic arthritis on initial exam, will obtain fluid studies lab work.  Patient's initial fluid studies are concerning for septic arthritis with white blood cell count of 64,000.  There are no crystals seen.  I discussed the case with the hand surgeon, Dr. Jeannie Fend.  Will come in to evaluate the patient and taken to the OR for washout.  He is requesting I discussed with the hospitalist for admission and likely antibiotic management.  The patients results and plan were reviewed and discussed.   Any x-rays performed were independently reviewed by myself.   Differential diagnosis were considered with the presenting HPI.  Medications  acetaminophen (TYLENOL) tablet 1,000 mg (1,000 mg Oral Given 12/30/18 1902)  oxyCODONE (Oxy IR/ROXICODONE) immediate release tablet 5 mg (5 mg Oral Given 12/30/18 1902)  lidocaine-EPINEPHrine (XYLOCAINE W/EPI) 2 %-1:200000 (PF) injection 10 mL (10 mLs Intradermal Given 12/30/18 1903)    Vitals:   12/30/18 1808 12/30/18 1845 12/30/18 2125  BP: (!) 161/105 (!) 169/100 (!) 163/100  Pulse: 73 73 72  Resp: 18 18 15   Temp: 98.8 F (37.1 C)  98.7 F (37.1 C)  TempSrc: Oral  Oral  SpO2: 100% 100% 99%  Weight: 115.7 kg  Height: 5\' 11"  (1.803 m)      Final diagnoses:  Pyogenic arthritis of right elbow, due to unspecified organism [facility])    Admission/ observation were discussed with the admitting physician, patient and/or family and they are comfortable with the plan.    Final Clinical Impressions(s) / ED Diagnoses    Final diagnoses:  Pyogenic arthritis of right elbow, due to unspecified organism [facility])    ED Discharge Orders    None       Deno Etienne, DO 12/30/18 2155

## 2018-12-30 NOTE — ED Notes (Signed)
SPOKE WITH OR WAITING COVID SCREEN

## 2018-12-30 NOTE — ED Triage Notes (Signed)
Patient c/o right elbow pain since 0100 today. Denies injury. Reports pain worsens with movement. Hx gout.

## 2018-12-30 NOTE — H&P (Signed)
History and Physical    [name] DXI:[id number] DOB: 1970-12-01 DOA: 12/30/2018  PCP: Tracie Harrier, MD   Patient coming from: Home  Chief Complaint: Right elbow pain   HPI: [name] is a 48 y.o. male with medical history significant for insulin-dependent diabetes mellitus, chronic kidney disease stage II, chronic right leg wound, hypertension, and history of right leg DVT on Xarelto, now presenting to the emergency department for evaluation of acute onset pain and swelling of the right elbow.  Patient reports he went to bed in his usual state last night but woke early this morning with right elbow pain.  He noticed some swelling around the elbow and has been having pain localized to the dorsal aspect.  He has not noted any fevers or chills, denies any preceding injury, did not have any wound near the elbow, and has not experienced this previously.  Reports that it has been months since he has taken his Xarelto due to running out.  He denies any recent fevers, chills, chest pain, cough, or shortness of breath.  Reports that his right leg wound continues to heal, but remains open at the lateral aspect near the ankle.  ED Course: Upon arrival to the ED, patient is found to be afebrile, saturating well on room air, and hypertensive to 170/100.  Chemistry panel features a creatinine 1.32, similar to priors, and CBC is unremarkable.  CRP is elevated to 5.4.  Plain radiographs of the right elbow are negative for fracture dislocation but notable for soft tissue swelling about the elbow and a small joint effusion.  Patient was treated with acetaminophen and oxycodone in the ED and hand surgery was consulted by the ED physician, recommended medical admission.  Review of Systems:  All other systems reviewed and apart from HPI, are negative.  Past Medical History:  Diagnosis Date  . Acute blood loss as cause of postoperative anemia 04/18/2017  . ARF (acute renal failure) ([place])   .  Cellulitis of right lower leg 03/31/2017  . Chronic anticoagulation 03/31/2017  . Diabetes mellitus without complication ([place])   . Diabetic ulcer of calf ([place]) 03/31/2017  . DVT of proximal lower limb ([place]) 03/20/2017   R calf area  . Escherichia coli sepsis ([place]) 04/18/2017  . Gout   . History of hiatal hernia   . Hypertension   . Hypoalbuminemia 03/31/2017  . Necrotizing fasciitis ([place])   . Right leg DVT ([place]) 03/31/2017  . Sepsis ([place]) 03/30/2017  . Sinus tachycardia 03/31/2017  . Type 2 diabetes mellitus with stage 2 chronic kidney disease, with long-term current use of insulin ([place]) 03/27/2017    Past Surgical History:  Procedure Laterality Date  . COLONOSCOPY WITH PROPOFOL N/A 02/10/2018   Procedure: COLONOSCOPY WITH PROPOFOL;  Surgeon: Lin Landsman, MD;  Location: [facility] ENDOSCOPY;  Service: Gastroenterology;  Laterality: N/A;  . ESOPHAGOGASTRODUODENOSCOPY (EGD) WITH PROPOFOL N/A 02/10/2018   Procedure: ESOPHAGOGASTRODUODENOSCOPY (EGD) WITH PROPOFOL;  Surgeon: Lin Landsman, MD;  Location[facility] END[facility]COPY;  Service: Gastroenterology;  Laterality: N/A;  . I&D EXTREMITY Right 04/03/2017   Procedure: IRRIGATION AND DEBRIDEMENT RIGHT LEG, APPLY WOUND VAC;  Surgeon: Newt Minion, MD;  Location: [place];  Service: Orthopedics;  Laterality: Right;  . I&D EXTREMITY Right 04/05/2017   Procedure: IRRIGATION AND DEBRIDEMENT RIGHT LEG;  Surgeon: Newt Minion, MD;  Location: [place];  Service: Orthopedics;  Laterality: Right;  . SCROTAL SURGERY  2006  . SKIN SPLIT GRAFT Right 04/10/2017   Procedure: SKIN  GRAFT SPLIT THICKNESS RIGHT LEG;  Surgeon: Newt Minion, MD;  Location: [place];  Service: Orthopedics;  Laterality: Right;     reports that he has never smoked. He has never used smokeless tobacco. He reports that he does not drink alcohol or use drugs.  No Known Allergies  Family History  Problem Relation Age of Onset  . Hypertension Mother   . Stroke Mother   . Diabetes Father   .  Renal Disease Father   . Hypertension Sister   . Diabetes Sister   . Renal Disease Sister   . Iron deficiency Sister      Prior to Admission medications   Medication Sig Start Date End Date Taking? Authorizing Provider  ACCU-CHEK FASTCLIX LANCETS MISC U UTD TO CHECK BLOOD SUGAR TID 01/13/18   [provider]  ACCU-CHEK GUIDE test strip USE TO TEST QID UTD 12/13/17   [provider]  Amino Acids-Protein Hydrolys (FEEDING SUPPLEMENT, PRO-STAT SUGAR FREE 64,) LIQD Take 30 mLs by mouth 3 (three) times daily between meals. Patient not taking: Reported on 05/23/2018 04/12/17   Hosie Poisson, MD  amLODipine (NORVASC) 10 MG tablet Take 10 mg by mouth daily.    [provider]  cyclobenzaprine (FLEXERIL) 5 MG tablet Take 1 tablet (5 mg total) by mouth 3 (three) times daily as needed for muscle spasms. 04/12/17   Hosie Poisson, MD  ferrous sulfate 325 (65 FE) MG EC tablet Take 325 mg by mouth 3 (three) times daily with meals.    [provider]  glucose blood (ACCU-CHEK COMPACT PLUS) test strip Use 4 (four) times daily Use as instructed. 11/13/17   [provider]  hydrALAZINE (APRESOLINE) 50 MG tablet Take 50 mg by mouth 3 (three) times daily.    [provider]  hydrochlorothiazide (HYDRODIURIL) 25 MG tablet  05/08/18   [provider]  HYDROcodone-acetaminophen (NORCO/VICODIN) 5-325 MG tablet TK 1 T PO QD PRF PAIN 03/19/18   [provider]  insulin aspart (NOVOLOG FLEXPEN) 100 UNIT/ML FlexPen Inject into the skin. Sliding scale 121-150 2 units; 151-200 3 units; 201-250 5 units; 251-300 8 units; 301-350 11 units; 351-400 15 units, grater than 400 - 15 units, call doctor.    [provider]  lisinopril (PRINIVIL,ZESTRIL) 10 MG tablet Take 10 mg by mouth daily.    [provider]  lisinopril (PRINIVIL,ZESTRIL) 20 MG tablet Take by mouth. 02/27/18 02/27/19  [provider]  lisinopril (PRINIVIL,ZESTRIL) 40 MG tablet   05/09/18   [provider]  metoprolol tartrate (LOPRESSOR) 100 MG tablet Take 1 tablet (100 mg total) by mouth 2 (two) times daily. 04/12/17   Hosie Poisson, MD  omeprazole (PRILOSEC) 40 MG capsule Take 1 capsule (40 mg total) by mouth 2 (two) times daily. 05/23/18 08/21/18  Lin Landsman, MD  rivaroxaban (XARELTO) 20 MG TABS tablet Take 20 mg by mouth daily with supper.    [provider]    Physical Exam: Vitals:   12/30/18 1808 12/30/18 1845 12/30/18 2125  BP: (!) 161/105 (!) 169/100 (!) 163/100  Pulse: 73 73 72  Resp: 18 18 15   Temp: 98.8 F (37.1 C)  98.7 F (37.1 C)  TempSrc: Oral  Oral  SpO2: 100% 100% 99%  Weight: 115.7 kg    Height: 5\' 11"  (1.803 m)      Constitutional: NAD, calm  Eyes: PERTLA, lids and conjunctivae normal ENMT: Mucous membranes are moist. Posterior pharynx clear of any exudate or lesions.   Neck:  normal, supple, no masses, no thyromegaly Respiratory: clear to auscultation bilaterally, no wheezing, no crackles. No accessory muscle use.  Cardiovascular: S1 & S2 heard, regular rate and rhythm. No significant JVD. Abdomen: No distension, no tenderness, soft. Bowel sounds normal.  Musculoskeletal: no clubbing / cyanosis. Right elbow tender and edematous at dorsal aspect.    Skin: no significant rashes, lesions, ulcers. Warm, dry, well-perfused. Neurologic: CN 2-12 grossly intact. Sensation intact. Strength 5/5 in all 4 limbs.  Psychiatric: Alert and oriented x 3. Very pleasant, cooperative.    Labs on Admission: I have personally reviewed following labs and imaging studies  CBC: Recent Labs  Lab 12/30/18 1838  WBC 8.7  NEUTROABS 6.5  HGB 16.1  HCT 49.6  MCV 91.7  PLT 099   Basic Metabolic Panel: Recent Labs  Lab 12/30/18 1838  NA 138  K 4.2  CL 104  CO2 21*  GLUCOSE 162*  BUN 18  CREATININE 1.32*  CALCIUM 9.3   GFR: Estimated Creatinine Clearance: 89.5 mL/min (A) (by C-G formula based on SCr of 1.32 mg/dL (H)).  Liver Function Tests: No results for input(s): AST, ALT, ALKPHOS, BILITOT, PROT, ALBUMIN in the last 168 hours. No results for input(s): LIPASE, AMYLASE in the last 168 hours. No results for input(s): AMMONIA in the last 168 hours. Coagulation Profile: No results for input(s): INR, PROTIME in the last 168 hours. Cardiac Enzymes: No results for input(s): CKTOTAL, CKMB, CKMBINDEX, TROPONINI in the last 168 hours. BNP (last 3 results) No results for input(s): PROBNP in the last 8760 hours. HbA1C: No results for input(s): HGBA1C in the last 72 hours. CBG: No results for input(s): GLUCAP in the last 168 hours. Lipid Profile: No results for input(s): CHOL, HDL, LDLCALC, TRIG, CHOLHDL, LDLDIRECT in the last 72 hours. Thyroid Function Tests: No results for input(s): TSH, T4TOTAL, FREET4, T3FREE, THYROIDAB in the last 72 hours. Anemia Panel: No results for input(s): VITAMINB12, FOLATE, FERRITIN, TIBC, IRON, RETICCTPCT in the last 72 hours. Urine analysis:    Component Value Date/Time   COLORURINE YELLOW (A) 01/23/2018 0012   APPEARANCEUR CLEAR (A) 01/23/2018 0012   LABSPEC 1.017 01/23/2018 0012   PHURINE 6.0 01/23/2018 0012   GLUCOSEU NEGATIVE 01/23/2018 0012   HGBUR NEGATIVE 01/23/2018 0012   BILIRUBINUR NEGATIVE 01/23/2018 0012   KETONESUR NEGATIVE 01/23/2018 0012   PROTEINUR 100 (A) 01/23/2018 0012   NITRITE NEGATIVE 01/23/2018 0012   LEUKOCYTESUR NEGATIVE 01/23/2018 0012   Sepsis Labs: @LABRCNTIP (procalcitonin:4,lacticidven:4) ) Recent Results (from the past 240 hour(s))  Body fluid culture     Status: None (Preliminary result)   Collection Time: 12/30/18  7:15 PM   Specimen: Synovium; Body Fluid  Result Value Ref Range Status   Specimen Description SYNOVIAL  Final   Special Requests Normal  Final   Gram Stain   Final    ABUNDANT WBC PRESENT, PREDOMINANTLY PMN NO ORGANISMS SEEN Gram Stain Report Called to,Read Back By and Verified With: RN [facility] AT 2147 12/30/18  CRUICKSHANK A Performed at [facility] [place] [address] [place], [place] [address]    Culture PENDING  Incomplete   Report Status PENDING  Incomplete     Radiological Exams on Admission: Dg Elbow Complete Right  Result Date: 12/30/2018 CLINICAL DATA:  Right elbow pain EXAM: RIGHT ELBOW - COMPLETE 3+ VIEW COMPARISON:  None. FINDINGS: There is no acute displaced fracture or dislocation. There is some mild soft tissue swelling about the posterior aspect of the elbow. There is a small joint  effusion. There is no radiopaque foreign body. IMPRESSION: 1. No acute displaced fracture or dislocation. 2. Soft tissue swelling about the elbow. 3. Small joint effusion. Electronically Signed   By: Constance Holster M.D.   On: 12/30/2018 19:33    EKG: Not performed.   Assessment/Plan   1. Right elbow pain and swelling  - Presents with one day of pain and swelling involving right elbow, found to be afebrile with no leukocytosis, cloudy joint aspirate with 65k WBC and gram-stain pending  - Hand surgery was consulted by ED physician  - Continue pain-control and supportive care, keep NPO pending surgeon's evaluation, follow-up gram stain and culture    2. Insulin-dependent DM  - No recent A1c available  - Managed at home with Lantus Novolog per sliding-scale at home, will continue   3. History of DVT  - History RLE DVT  - Reports that he has been off of Xarelto for ~3 months since running out  - Continue to hold Xarelto pending surgeon's evaluation   4. Hypertension  - BP elevated in ED with pain likely contributing  - Continue pain-control, continue Norvasc, Lopressor, and hydralazine     PPE: Mask, face shield. Patient wearing mask.  DVT prophylaxis: SCD's, Xarelto pta  Code Status: Full  Family Communication: Discussed with patient  Consults called: Hand surgery consulted by ED physician  Admission status: Observation     Vianne Bulls, MD [facility] Pager  [phone]  If 7PM-7AM, please contact night-coverage [email or web address] Password [facility]  12/30/2018, 9:56 PM

## 2018-12-30 NOTE — Anesthesia Preprocedure Evaluation (Signed)
Anesthesia Evaluation  Patient identified by MRN, date of birth, ID band Patient awake    Reviewed: Allergy & Precautions, NPO status , Patient's Chart, lab work & pertinent test results  Airway Mallampati: II  TM Distance: >3 FB Neck ROM: Full    Dental no notable dental hx.    Pulmonary neg pulmonary ROS,    Pulmonary exam normal breath sounds clear to auscultation       Cardiovascular hypertension, + DVT  Normal cardiovascular exam Rhythm:Regular Rate:Normal     Neuro/Psych negative neurological ROS  negative psych ROS   GI/Hepatic Neg liver ROS, hiatal hernia,   Endo/Other  diabetes, Type 2, Insulin Dependent  Renal/GU Renal disease  negative genitourinary   Musculoskeletal negative musculoskeletal ROS (+)   Abdominal   Peds negative pediatric ROS (+)  Hematology negative hematology ROS (+)   Anesthesia Other Findings   Reproductive/Obstetrics negative OB ROS                             Anesthesia Physical Anesthesia Plan  ASA: III  Anesthesia Plan: General   Post-op Pain Management:    Induction: Intravenous  PONV Risk Score and Plan: 2 and Ondansetron, Midazolam and Treatment may vary due to age or medical condition  Airway Management Planned: Oral ETT  Additional Equipment:   Intra-op Plan:   Post-operative Plan: Extubation in OR  Informed Consent: I have reviewed the patients History and Physical, chart, labs and discussed the procedure including the risks, benefits and alternatives for the proposed anesthesia with the patient or authorized representative who has indicated his/her understanding and acceptance.     Dental advisory given  Plan Discussed with: CRNA  Anesthesia Plan Comments:         Anesthesia Quick Evaluation

## 2018-12-30 NOTE — ED Notes (Signed)
Patient transported to X-ray 

## 2018-12-31 ENCOUNTER — Observation Stay (HOSPITAL_COMMUNITY): Payer: Self-pay | Admitting: Certified Registered Nurse Anesthetist

## 2018-12-31 ENCOUNTER — Encounter (HOSPITAL_COMMUNITY): Payer: Self-pay | Admitting: Orthopaedic Surgery

## 2018-12-31 DIAGNOSIS — I83009 Varicose veins of unspecified lower extremity with ulcer of unspecified site: Secondary | ICD-10-CM | POA: Diagnosis present

## 2018-12-31 DIAGNOSIS — Z8619 Personal history of other infectious and parasitic diseases: Secondary | ICD-10-CM

## 2018-12-31 DIAGNOSIS — L97909 Non-pressure chronic ulcer of unspecified part of unspecified lower leg with unspecified severity: Secondary | ICD-10-CM | POA: Diagnosis present

## 2018-12-31 DIAGNOSIS — Z872 Personal history of diseases of the skin and subcutaneous tissue: Secondary | ICD-10-CM

## 2018-12-31 DIAGNOSIS — Z794 Long term (current) use of insulin: Secondary | ICD-10-CM

## 2018-12-31 DIAGNOSIS — E119 Type 2 diabetes mellitus without complications: Secondary | ICD-10-CM

## 2018-12-31 DIAGNOSIS — M009 Pyogenic arthritis, unspecified: Principal | ICD-10-CM

## 2018-12-31 DIAGNOSIS — N182 Chronic kidney disease, stage 2 (mild): Secondary | ICD-10-CM | POA: Diagnosis present

## 2018-12-31 LAB — CBC WITH DIFFERENTIAL/PLATELET
Abs Immature Granulocytes: 0.02 10*3/uL (ref 0.00–0.07)
Basophils Absolute: 0 10*3/uL (ref 0.0–0.1)
Basophils Relative: 0 %
Eosinophils Absolute: 0 10*3/uL (ref 0.0–0.5)
Eosinophils Relative: 0 %
HCT: 48.5 % (ref 39.0–52.0)
Hemoglobin: 15.3 g/dL (ref 13.0–17.0)
Immature Granulocytes: 0 %
Lymphocytes Relative: 18 %
Lymphs Abs: 1.4 10*3/uL (ref 0.7–4.0)
MCH: 29.5 pg (ref 26.0–34.0)
MCHC: 31.5 g/dL (ref 30.0–36.0)
MCV: 93.6 fL (ref 80.0–100.0)
Monocytes Absolute: 0.6 10*3/uL (ref 0.1–1.0)
Monocytes Relative: 8 %
Neutro Abs: 5.9 10*3/uL (ref 1.7–7.7)
Neutrophils Relative %: 74 %
Platelets: 193 10*3/uL (ref 150–400)
RBC: 5.18 MIL/uL (ref 4.22–5.81)
RDW: 13 % (ref 11.5–15.5)
WBC: 8.1 10*3/uL (ref 4.0–10.5)
nRBC: 0 % (ref 0.0–0.2)

## 2018-12-31 LAB — BASIC METABOLIC PANEL
Anion gap: 13 (ref 5–15)
BUN: 17 mg/dL (ref 6–20)
CO2: 24 mmol/L (ref 22–32)
Calcium: 9 mg/dL (ref 8.9–10.3)
Chloride: 102 mmol/L (ref 98–111)
Creatinine, Ser: 1.35 mg/dL — ABNORMAL HIGH (ref 0.61–1.24)
GFR calc Af Amer: >60 mL/min (ref >60)
GFR calc non Af Amer: >60 mL/min (ref >60)
Glucose, Bld: 167 mg/dL — ABNORMAL HIGH (ref 70–99)
Potassium: 3.9 mmol/L (ref 3.5–5.1)
Sodium: 139 mmol/L (ref 135–145)

## 2018-12-31 LAB — GLUCOSE, CAPILLARY
Glucose-Capillary: 138 mg/dL — ABNORMAL HIGH (ref 70–99)
Glucose-Capillary: 139 mg/dL — ABNORMAL HIGH (ref 70–99)
Glucose-Capillary: 144 mg/dL — ABNORMAL HIGH (ref 70–99)
Glucose-Capillary: 145 mg/dL — ABNORMAL HIGH (ref 70–99)
Glucose-Capillary: 152 mg/dL — ABNORMAL HIGH (ref 70–99)
Glucose-Capillary: 159 mg/dL — ABNORMAL HIGH (ref 70–99)
Glucose-Capillary: 180 mg/dL — ABNORMAL HIGH (ref 70–99)
Glucose-Capillary: 204 mg/dL — ABNORMAL HIGH (ref 70–99)

## 2018-12-31 LAB — C-REACTIVE PROTEIN: CRP: 8.2 mg/dL — ABNORMAL HIGH (ref <1.0)

## 2018-12-31 LAB — HEMOGLOBIN A1C
Hgb A1c MFr Bld: 6.5 % — ABNORMAL HIGH (ref 4.8–5.6)
Mean Plasma Glucose: 139.85 mg/dL

## 2018-12-31 LAB — HIV ANTIBODY (ROUTINE TESTING W REFLEX): HIV Screen 4th Generation wRfx: NONREACTIVE

## 2018-12-31 MED ORDER — LACTATED RINGERS IV SOLN: INTRAVENOUS | Status: DC

## 2018-12-31 MED ORDER — INSULIN ASPART 100 UNIT/ML ~~LOC~~ SOLN: 0.0000 [IU] | Freq: Every day | SUBCUTANEOUS | Status: DC

## 2018-12-31 MED ORDER — CEFAZOLIN SODIUM-DEXTROSE 2-4 GM/100ML-% IV SOLN
INTRAVENOUS | Status: AC
  Filled 2018-12-31: qty 100

## 2018-12-31 MED ORDER — VANCOMYCIN HCL 10 G IV SOLR
2500.0000 mg | Freq: Once | INTRAVENOUS | Status: AC
  Administered 2018-12-31: 04:00:00 2500 mg via INTRAVENOUS
  Filled 2018-12-31: qty 500

## 2018-12-31 MED ORDER — INSULIN ASPART 100 UNIT/ML ~~LOC~~ SOLN
0.0000 [IU] | Freq: Three times a day (TID) | SUBCUTANEOUS | Status: DC
  Administered 2018-12-31: 17:00:00 2 [IU] via SUBCUTANEOUS
  Administered 2019-01-01 – 2019-01-02 (×3): 1 [IU] via SUBCUTANEOUS

## 2018-12-31 MED ORDER — METOCLOPRAMIDE HCL 5 MG/ML IJ SOLN: 10.0000 mg | Freq: Once | INTRAMUSCULAR | Status: DC | PRN

## 2018-12-31 MED ORDER — ONDANSETRON HCL 4 MG/2ML IJ SOLN
INTRAMUSCULAR | Status: DC | PRN
  Administered 2018-12-31: 4 mg via INTRAVENOUS

## 2018-12-31 MED ORDER — VANCOMYCIN HCL 1000 MG IV SOLR
1000.0000 mg | Freq: Two times a day (BID) | INTRAVENOUS | Status: DC
  Administered 2018-12-31 – 2019-01-02 (×5): 1000 mg via INTRAVENOUS
  Filled 2018-12-31 (×7): qty 1000

## 2018-12-31 MED ORDER — BUPIVACAINE HCL (PF) 0.5 % IJ SOLN
INTRAMUSCULAR | Status: DC | PRN
  Administered 2018-12-31: 20 mL

## 2018-12-31 MED ORDER — SUCCINYLCHOLINE CHLORIDE 20 MG/ML IJ SOLN
INTRAMUSCULAR | Status: DC | PRN
  Administered 2018-12-31: 140 mg via INTRAVENOUS

## 2018-12-31 MED ORDER — FENTANYL CITRATE (PF) 100 MCG/2ML IJ SOLN: 25.0000 ug | INTRAMUSCULAR | Status: DC | PRN

## 2018-12-31 MED ORDER — PROPOFOL 10 MG/ML IV BOLUS
INTRAVENOUS | Status: DC | PRN
  Administered 2018-12-31: 200 mg via INTRAVENOUS

## 2018-12-31 MED ORDER — MEPERIDINE HCL 50 MG/ML IJ SOLN: 6.2500 mg | INTRAMUSCULAR | Status: DC | PRN

## 2018-12-31 MED ORDER — LACTATED RINGERS IV SOLN
INTRAVENOUS | Status: DC | PRN
  Administered 2018-12-31: via INTRAVENOUS

## 2018-12-31 MED ORDER — LIDOCAINE 2% (20 MG/ML) 5 ML SYRINGE
INTRAMUSCULAR | Status: DC | PRN
  Administered 2018-12-31: 60 mg via INTRAVENOUS

## 2018-12-31 MED ORDER — VANCOMYCIN HCL IN DEXTROSE 1-5 GM/200ML-% IV SOLN
1000.0000 mg | Freq: Two times a day (BID) | INTRAVENOUS | Status: DC
  Filled 2018-12-31: qty 200

## 2018-12-31 MED ORDER — MIDAZOLAM HCL 5 MG/5ML IJ SOLN
INTRAMUSCULAR | Status: DC | PRN
  Administered 2018-12-31: 2 mg via INTRAVENOUS

## 2018-12-31 MED ORDER — CEFAZOLIN SODIUM-DEXTROSE 2-3 GM-%(50ML) IV SOLR
INTRAVENOUS | Status: DC | PRN
  Administered 2018-12-31: 2 g via INTRAVENOUS

## 2018-12-31 MED ORDER — SODIUM CHLORIDE 0.9 % IV SOLN
INTRAVENOUS | Status: DC | PRN
  Administered 2018-12-31: 500 mL

## 2018-12-31 MED ORDER — FENTANYL CITRATE (PF) 100 MCG/2ML IJ SOLN
INTRAMUSCULAR | Status: DC | PRN
  Administered 2018-12-31: 100 ug via INTRAVENOUS

## 2018-12-31 MED ORDER — PROPOFOL 10 MG/ML IV BOLUS
INTRAVENOUS | Status: AC
  Filled 2018-12-31: qty 20

## 2018-12-31 NOTE — Anesthesia Procedure Notes (Signed)
Procedure Name: Intubation Performed by: Gean Maidens, CRNA Pre-anesthesia Checklist: Patient identified, Emergency Drugs available, Suction available, Patient being monitored and Timeout performed Patient Re-evaluated:Patient Re-evaluated prior to induction Oxygen Delivery Method: Circle system utilized Preoxygenation: Pre-oxygenation with 100% oxygen Induction Type: IV induction Ventilation: Mask ventilation without difficulty Laryngoscope Size: Mac and 4 Grade View: Grade III Tube type: Oral Tube size: 7.5 mm Number of attempts: 1 Airway Equipment and Method: Stylet Placement Confirmation: ETT inserted through vocal cords under direct vision,  positive ETCO2 and breath sounds checked- equal and bilateral Secured at: 23 cm Tube secured with: Tape Dental Injury: Teeth and Oropharynx as per pre-operative assessment

## 2018-12-31 NOTE — Progress Notes (Signed)
Pharmacy Antibiotic Note  [name] is a 48 y.o. male admitted on 12/30/2018 with right elbow septic arthritis. Pharmacy has been consulted for vancomycin dosing.  Plan: Vancomycin 2500 mg IV loading dose Vancomycin 1000 mg IV Q 12 hrs. Goal AUC 400-550. Expected AUC: 527.4 31.7/14.5 SCr used: 1.32 Vd 0.5 Wt 115.7 kg F/u renal fxn, WBC, temp, culture data Vancomycin levels as needed  Height: 5\' 11"  (180.3 cm) Weight: 255 lb (115.7 kg) IBW/kg (Calculated) : 75.3  Temp (24hrs), Avg:98.7 F (37.1 C), Min:98.7 F (37.1 C), Max:98.8 F (37.1 C)  Recent Labs  Lab 12/30/18 1838  WBC 8.7  CREATININE 1.32*    Estimated Creatinine Clearance: 89.5 mL/min (A) (by C-G formula based on SCr of 1.32 mg/dL (H)).    No Known Allergies  Antimicrobials this admission: 6/17 vanc>> 6/17 ancef x 1 in OR Dose adjustments this admission:  Microbiology results: 6/16 SARS-CoV2 negative 6/16 synovial fluid: gram stain neg  6/17 Surgical cx aerobic/anaerobic: 6/17 surgical cx anaerobic:  Thank you for allowing pharmacy to be a part of this patient's care.  Eudelia Bunch, Pharm.D 12/31/2018 1:32 AM

## 2018-12-31 NOTE — Evaluation (Signed)
Occupational Therapy Evaluation Patient Details Name: [name] MRN: [id number] DOB: June 08, 1971 Today's Date: 12/31/2018    History of Present Illness Pt was admitted with septic arthritis of R elbow, s/p I & D.  PMH:  DM, HTN, Gout and DVT   Clinical Impression   This 48 year old man was admitted for the above.  At baseline, he is independent.  He stays with his 58 year old mother who can only provide limited assistance. Will follow in acute setting focusing on edema management, gentle ROM and increasing independence with adls.      Follow Up Recommendations  Supervision - Intermittent    Equipment Recommendations  None recommended by OT(likely)    Recommendations for Other Services       Precautions / Restrictions Restrictions Weight Bearing Restrictions: No Other Position/Activity Restrictions: NWB RUE      Mobility Bed Mobility Overal bed mobility: Needs Assistance Bed Mobility: Supine to Sit;Sit to Supine     Supine to sit: Min assist;HOB elevated Sit to supine: Min guard(HOB elevated only slightly for back to bed)   General bed mobility comments: could not tolerate HOB down for OOB, did better with this for back to bed. does a combo of semi rolling and using abs  Transfers Overall transfer level: Independent                    Balance Overall balance assessment: No apparent balance deficits (not formally assessed)                                         ADL either performed or assessed with clinical judgement   ADL Overall ADL's : Needs assistance/impaired Eating/Feeding: Set up   Grooming: Minimal assistance;Oral care;Standing   Upper Body Bathing: Moderate assistance   Lower Body Bathing: Moderate assistance   Upper Body Dressing : Minimal assistance   Lower Body Dressing: Maximal assistance   Toilet Transfer: Supervision/safety(assist for lines)   Toileting- Clothing Manipulation and Hygiene: Minimal assistance          General ADL Comments: stood at sink for teeth and ambulated to bathroom to urinate; other ADLs simulated     Vision         Perception     Praxis      Pertinent Vitals/Pain Pain Assessment: Faces Faces Pain Scale: Hurts little more Pain Location: RUE with movement Pain Descriptors / Indicators: Sore;Tightness Pain Intervention(s): Limited activity within patient's tolerance;Monitored during session;Repositioned     Hand Dominance Right   Extremity/Trunk Assessment Upper Extremity Assessment Upper Extremity Assessment: RUE deficits/detail RUE Deficits / Details: can wiggle fingers, approximate fist, 2nd digit lags.  Gentle AAROM to wrist and elbow, very small range (approximately 20 degrees), painful when pt tried to move without therapist taking weight/providing a little assistance           Communication Communication Communication: No difficulties   Cognition Arousal/Alertness: Awake/alert Behavior During Therapy: WFL for tasks assessed/performed Overall Cognitive Status: Within Functional Limits for tasks assessed                                     General Comments  pt reports open area on RLE; dressed    Exercises Exercises: Other exercises Other Exercises Other Exercises: AROM fingers, AAROM small range wrist and  elbow x 10 Other Exercises: educated on edema control and positioned   Shoulder Instructions      Home Living Family/patient expects to be discharged to:: Private residence Living Arrangements: Parent Available Help at Discharge: Family Type of Home: House Home Access: Level entry     Home Layout: One level         Biochemist, clinical: Standard         Additional Comments: home with mother who is 29 y o      Prior Functioning/Environment Level of Independence: Independent                 OT Problem List: Decreased strength;Decreased range of motion;Decreased activity tolerance;Decreased knowledge of  use of DME or AE;Pain;Impaired UE functional use      OT Treatment/Interventions: Self-care/ADL training;Therapeutic exercise;DME and/or AE instruction;Patient/family education;Therapeutic activities    OT Goals(Current goals can be found in the care plan section) Acute Rehab OT Goals Patient Stated Goal: return to independence OT Goal Formulation: With patient Time For Goal Achievement: 01/14/19 Potential to Achieve Goals: Good ADL Goals Additional ADL Goal #1: pt will be independent with edema control Additional ADL Goal #2: Pt will be independent with gentle ROM HEP Additional ADL Goal #3: pt will complete adl with set up for UB adls and LB bathing with long sponge; and complete LB dressing with min A  OT Frequency: Min 3X/week   Barriers to D/C:            Co-evaluation              AM-PAC OT "6 Clicks" Daily Activity     Outcome Measure Help from another person eating meals?: A Little Help from another person taking care of personal grooming?: A Little Help from another person toileting, which includes using toliet, bedpan, or urinal?: A Little Help from another person bathing (including washing, rinsing, drying)?: A Lot Help from another person to put on and taking off regular upper body clothing?: A Little Help from another person to put on and taking off regular lower body clothing?: A Lot 6 Click Score: 16   End of Session    Activity Tolerance: Patient tolerated treatment well Patient left: in bed;with call bell/phone within reach  OT Visit Diagnosis: Pain Pain - Right/Left: Right Pain - part of body: Arm                Time: 3299-2426 OT Time Calculation (min): 23 min Charges:  OT General Charges $OT Visit: 1 Visit OT Evaluation $OT Eval Low Complexity: 1 Low  Lesle Chris, OTR/L Acute Rehabilitation Services [phone] [facility] pager [phone] office 12/31/2018  [place] 12/31/2018, 10:23 AM

## 2018-12-31 NOTE — Consult Note (Signed)
[facility] Nurse wound consult note Reason for Consult: chronic nonhealing right lateral LE ulcer. Seen in an outpatient [facility] in [facility], [place] Wound type: venous insufficiency Pressure Injury POA: NA Measurement: 6cm x 1.5cm x 0.2cm with red, moist wound bed Wound bed:See above Drainage (amount, consistency, odor) small amount serous Periwound:intact with evidence of previous wound healing (scar with delayed return of melanin) Dressing procedure/placement/frequency: I will provide the Nursing staff with conservative care orders for this wound using xeroform gauze and dry boot (Kerlix/ACE) while in house.  He may return to the outpatient [facility] upon discharge for continuation of their orders.  [facility] nursing team will not follow, but will remain available to this patient, the nursing and medical teams.  Please re-consult if needed. Thanks, Maudie Flakes, MSN, RN, [place], Arther Abbott  Pager# [phone]

## 2018-12-31 NOTE — Progress Notes (Signed)
   Ortho Hand Progress Note  Subjective: No acute events since surgery.  Continues to have right elbow pain.  He may note some mild improvement from before surgery.   Objective: Vital signs in last 24 hours: Temp:  [98.2 F (36.8 C)-99.1 F (37.3 C)] 98.9 F (37.2 C) (06/17 0852) Pulse Rate:  [66-80] 80 (06/17 0852) Resp:  [15-18] 16 (06/17 0852) BP: (151-179)/(96-108) 163/99 (06/17 0852) SpO2:  [94 %-100 %] 94 % (06/17 0852) Weight:  [115.7 kg] 115.7 kg (06/16 1808)  Intake/Output from previous day: 06/16 0701 - 06/17 0700 In: 1550 [P.O.:360; I.V.:940; IV Piggyback:250] Out: 700 [Urine:600; Drains:90; Blood:10] Intake/Output this shift: Total I/O In: 120 [P.O.:120] Out: 15 [Drains:15]  Recent Labs    12/30/18 1838 12/31/18 0422  HGB 16.1 15.3   Recent Labs    12/30/18 1838 12/31/18 0422  WBC 8.7 8.1  RBC 5.41 5.18  HCT 49.6 48.5  PLT 258 193   Recent Labs    12/30/18 1838 12/31/18 0422  NA 138 139  K 4.2 3.9  CL 104 102  CO2 21* 24  BUN 18 17  CREATININE 1.32* 1.35*  GLUCOSE 162* 167*  CALCIUM 9.3 9.0   No results for input(s): LABPT, INR in the last 72 hours.  aaox3 nad resp nonlabored rrr RUE: Surgical dressings c/d/i. Drain with serosang output. Continued pain with elbow ROM though he may have approximately 10-15 degrees more motion from pre op. Motor intact ain/pin/u. SILT m/u/r. Fingers wwp with bcr.  Assessment/Plan: 48 yo M with right elbow septic arthritis, POD0 from Right elbow I&D  - Hospitalist primary. Appreciate management - Continue IV abx. Recommend ID consult for continued, outpatient antibiotic plan - Cxs pending - Continue drain at this point. Likely remove tomorrow - OK for gentle elbow ROM. - Encouraged finger and wrist ROM as tolerated - Elevate RUE for pain and swelling - Remainder per primary  Avanell Shackleton III 12/31/2018, 12:11 PM  [phone]

## 2018-12-31 NOTE — Transfer of Care (Signed)
Immediate Anesthesia Transfer of Care Note  Patient: [name]  Procedure(s) Performed: MINOR INCISION AND DRAINAGE OF ABSCESS (Right )  Patient Location: PACU  Anesthesia Type:General  Level of Consciousness: awake, alert  and oriented  Airway & Oxygen Therapy: Patient Spontanous Breathing and Patient connected to face mask oxygen  Post-op Assessment: Report given to RN and Post -op Vital signs reviewed and stable  Post vital signs: Reviewed and stable  Last Vitals:  Vitals Value Taken Time  BP 151/102 12/31/18 0124  Temp    Pulse 77 12/31/18 0125  Resp 14 12/31/18 0125  SpO2 94 % 12/31/18 0125  Vitals shown include unvalidated device data.  Last Pain:  Vitals:   12/30/18 2313  TempSrc: Oral  PainSc:          Complications: No apparent anesthesia complications

## 2018-12-31 NOTE — Op Note (Signed)
PREOPERATIVE DIAGNOSIS: Right elbow septic arthritis  POSTOPERATIVE DIAGNOSIS: Same  ATTENDING PHYSICIAN: Maudry Mayhew. Jeannie Fend, III, MD who was present and scrubbed for the entire case   ASSISTANT SURGEON: None.   ANESTHESIA: General  SURGICAL PROCEDURES: Right elbow arthrotomy and irrigation debridement for septic arthritis  SURGICAL INDICATIONS: Patient is a 48 year old male who presented to the ER earlier today.  He had acute onset right elbow pain as well as loss of range of motion.  He denied any trauma.  Aspirate performed by the ER was found to have 65,000 white blood cells which was consistent with a septic arthritis.  After discussing treatment options the patient did agree to proceed with operative drainage of the right elbow.  FINDING: Abundant, cloudy fluid within the elbow joint.  DESCRIPTION OF PROCEDURE: The patient was identified in the preoperative holding area where the risk benefits and alternatives of the procedure were discussed with the patient.  These include but are not limited to infection, bleeding, damage to surrounding structures including blood vessels and nerves, pain, stiffness, recurrence and need for additional procedures.  Informed consent was obtained that time patient's right elbow was marked with a surgical marking pen.  He was then brought back to operative suite where a timeout was performed identifying the correct patient operative site.  He was positioned supine operative table with his hand outstretched on a hand table.  He was induced under general endotracheal anesthesia.  The right upper extremity was then prepped and draped in usual sterile fashion.  Sterile tourniquet was placed on the upper arm.  The limb was then exsanguinated by gravity and the tourniquet was inflated.  Longitudinal incision was made over the lateral aspect of the elbow starting at the lateral epicondyle.  Dissection was carried through the subcutaneous tissues using electrocautery  until the level of the muscle and fascia was reached.  Plane between the ECRB and EDC muscles was identified.  Blunt dissection was performed to split the interval between these coming over top the anterior portion of the radial head.  Blunt dissection was then performed using retractors exposing the anterior capsule of the elbow joint.  The capsule was entered using scissors and there was a large blush of cloudy joint fluid.  This was cultured for aerobic and anaerobic bacteria.  A small window of capsule was excised and the joint was well visualized.  Normal saline mixed with antibiotics including bacitracin polymyxin was then used to irrigate the wound and joint.  Following that copious amounts of plain normal saline were used to further irrigate the wound and joint using cystoscopy tubing.  There is no further cloudy material being expressed from the joint following irrigation.  At this point a medium Hemovac drain was placed within the joint.  The skin was closed with interrupted 4-0 Prolene sutures.  Xeroform, 4 x 4's and a well-padded soft dressing were applied to the elbow.  The tourniquet was released and patient had return of brisk capillary refill to all of his digits.  He was woken from his anesthesia and extubated in operating room without any complications.  He was taken to the PACU in stable condition.  ESTIMATED BLOOD LOSS: 20 mL's  TOURNIQUET TIME: 31 minutes  SPECIMENS: Aerobic and anaerobic cultures  POSTOPERATIVE PLAN: The patient will be admitted to the hospitalist service.  We will follow-up cultures and likely consult infectious disease for antibiotic treatment plan going forward.  He can start early gentle range of motion as tolerated.  We will  plan to keep his sutures in for approximately 10 to 14 days.  IMPLANTS: None

## 2018-12-31 NOTE — Consult Note (Signed)
[name] for Infectious Disease    Date of Admission:  12/30/2018           Day 1 vancomycin       Reason for Consult: Right elbow septic arthritis    Referring Provider: Dr. Fayrene Helper Primary Care Provider: Dr. Tracie Harrier  Assessment: He has septic arthritis of his right elbow.  I agree with continuing empiric vancomycin to cover the most common pathogens such as staph and strep pending final culture results.  I talked to him about the probability of needing a PICC placed for 2 to 3 weeks of IV antibiotics.  I will check cultures and make that decision in the morning.  Plan: 1. Continue vancomycin pending final culture results  Principal Problem:   Septic arthritis of elbow, right ([name]) Active Problems:   Essential hypertension   Chronic anticoagulation   Type 2 diabetes mellitus with stage 2 chronic kidney disease, with long-term current use of insulin (HCC)   History of DVT (deep vein thrombosis)   Chronic venous insufficiency   Venous stasis ulcer (HCC)   Chronic kidney disease (CKD) stage G2/A2, mildly decreased glomerular filtration rate (GFR) between 60-89 mL/min/1.73 square meter and albuminuria creatinine ratio between 30-299 mg/g   Scheduled Meds: . amLODipine  10 mg Oral Daily  . hydrALAZINE  50 mg Oral TID  . insulin aspart  0-5 Units Subcutaneous QHS  . insulin aspart  0-9 Units Subcutaneous TID WC  . insulin glargine  15 Units Subcutaneous QHS  . metoprolol tartrate  100 mg Oral BID  . pantoprazole  40 mg Oral BID  . sodium chloride flush  3 mL Intravenous Q12H   Continuous Infusions: . sodium chloride    . vancomycin     PRN Meds:.sodium chloride, acetaminophen **OR** acetaminophen, cyclobenzaprine, HYDROcodone-acetaminophen, ketorolac, ondansetron **OR** ondansetron (ZOFRAN) IV, senna-docusate, sodium chloride flush  HPI: [name] is a 48 y.o. male with insulin-dependent diabetes.  He has been out of work since last  November.  He recently went to [place] to help a friend and returned home recently.  He said that he has been more tired than usual over the past week.  The night before last he noted some slight soreness in his right elbow.  The pain increased fairly quickly and he had trouble sleeping leading to admission yesterday.  He underwent arthrocentesis which showed 64,640 white blood cells of which 93% were segmented neutrophils.  No crystals were seen.  No organisms were seen on Gram stain.  He underwent incision and drainage early this morning.  No organisms were seen on the operative Gram stain.  Cultures are pending.  Two years ago he was hospitalized with necrotizing fasciitis of his right leg and E. coli bacteremia.  He was treated with serial debridement and 6 weeks of IV antibiotics therapy.  He did not have any problems managing his PICC line or antibiotics at that time.  His right leg wound has never healed completely.  He continues to be cared for at a wound care center in [place].  He says that the wound is gradually getting smaller.  There have been no changes recently to suggest worsening or infection.  He has not required any antibiotic therapy recently until this admission.  He is currently living here in [place] with his mother and older sister.   Review of Systems: Review of Systems  Constitutional: Negative for chills, diaphoresis and fever.  Respiratory: Negative for  cough, sputum production and shortness of breath.   Cardiovascular: Negative for chest pain.  Gastrointestinal: Negative for abdominal pain, diarrhea, nausea and vomiting.  Musculoskeletal: Positive for joint pain.    Past Medical History:  Diagnosis Date  . Acute blood loss as cause of postoperative anemia 04/18/2017  . ARF (acute renal failure) ([facility])   . Cellulitis of right lower leg 03/31/2017  . Chronic anticoagulation 03/31/2017  . Diabetes mellitus without complication ([facility])   . Diabetic ulcer of calf ([facility])  03/31/2017  . DVT of proximal lower limb ([facility]) 03/20/2017   R calf area  . Escherichia coli sepsis ([place]) 04/18/2017  . Gout   . History of hiatal hernia   . Hypertension   . Hypoalbuminemia 03/31/2017  . Necrotizing fasciitis ([facility])   . Right leg DVT ([facility]) 03/31/2017  . Sepsis ([facility]) 03/30/2017  . Sinus tachycardia 03/31/2017  . Type 2 diabetes mellitus with stage 2 chronic kidney disease, with long-term current use of insulin ([facility]) 03/27/2017    Social History   Tobacco Use  . Smoking status: Never Smoker  . Smokeless tobacco: Never Used  Substance Use Topics  . Alcohol use: No  . Drug use: No    Family History  Problem Relation Age of Onset  . Hypertension Mother   . Stroke Mother   . Diabetes Father   . Renal Disease Father   . Hypertension Sister   . Diabetes Sister   . Renal Disease Sister   . Iron deficiency Sister    No Known Allergies  OBJECTIVE: Blood pressure (!) 156/97, pulse 83, temperature 99.1 F (37.3 C), temperature source Oral, resp. rate 18, height 5\' 11"  (1.803 m), weight 115.7 kg, SpO2 95 %.  Physical Exam Constitutional:      Comments: He is in good spirits.  Cardiovascular:     Rate and Rhythm: Normal rate and regular rhythm.     Heart sounds: No murmur.  Pulmonary:     Effort: Pulmonary effort is normal.     Breath sounds: Normal breath sounds.  Abdominal:     Palpations: Abdomen is soft.     Tenderness: There is no abdominal tenderness.  Musculoskeletal:     Comments: He has a surgical dressing and Ace wrap on his right arm.  Surgical drain is in place with bloody drainage.  Psychiatric:        Mood and Affect: Mood normal.     Lab Results Lab Results  Component Value Date   WBC 8.1 12/31/2018   HGB 15.3 12/31/2018   HCT 48.5 12/31/2018   MCV 93.6 12/31/2018   PLT 193 12/31/2018    Lab Results  Component Value Date   CREATININE 1.35 (H) 12/31/2018   BUN 17 12/31/2018   NA 139 12/31/2018   K 3.9 12/31/2018   CL 102 12/31/2018    CO2 24 12/31/2018    Lab Results  Component Value Date   ALT 20 05/13/2018   AST 20 05/13/2018   ALKPHOS 71 05/13/2018   BILITOT 0.7 05/13/2018     Microbiology: Recent Results (from the past 240 hour(s))  Body fluid culture     Status: None (Preliminary result)   Collection Time: 12/30/18  7:15 PM   Specimen: Synovium; Body Fluid  Result Value Ref Range Status   Specimen Description   Final    SYNOVIAL Performed at [facility] [address], [place], [place] [address]    Special Requests   Final  SYRINGE Performed at [facility], [place] [address] [place], [place] [address]    Gram Stain   Final    ABUNDANT WBC PRESENT, PREDOMINANTLY PMN NO ORGANISMS SEEN Gram Stain Report Called to,Read Back By and Verified With: RN [facility] AT 2147 12/30/18 CRUICKSHANK A Performed at [facility], [place] [address] [place], [place] [address]    Culture   Final    NO GROWTH < 12 HOURS Performed at [facility] [address] [place], [place] [address]    Report Status PENDING  Incomplete  SARS Coronavirus 2 (CEPHEID - Performed in [facility] hospital lab), Hosp Order     Status: None   Collection Time: 12/30/18  9:39 PM   Specimen: Nasopharyngeal Swab  Result Value Ref Range Status   SARS Coronavirus 2 NEGATIVE NEGATIVE Final    Comment: (NOTE) If result is NEGATIVE SARS-CoV-2 target nucleic acids are NOT DETECTED. The SARS-CoV-2 RNA is generally detectable in upper and lower  respiratory specimens during the acute phase of infection. The lowest  concentration of SARS-CoV-2 viral copies this assay can detect is 250  copies / mL. A negative result does not preclude SARS-CoV-2 infection  and should not be used as the sole basis for treatment or other  patient management decisions.  A negative result may occur with  improper specimen collection / handling, submission of specimen other  than nasopharyngeal swab, presence of  viral mutation(s) within the  areas targeted by this assay, and inadequate number of viral copies  (<250 copies / mL). A negative result must be combined with clinical  observations, patient history, and epidemiological information. If result is POSITIVE SARS-CoV-2 target nucleic acids are DETECTED. The SARS-CoV-2 RNA is generally detectable in upper and lower  respiratory specimens dur ing the acute phase of infection.  Positive  results are indicative of active infection with SARS-CoV-2.  Clinical  correlation with patient history and other diagnostic information is  necessary to determine patient infection status.  Positive results do  not rule out bacterial infection or co-infection with other viruses. If result is PRESUMPTIVE POSTIVE SARS-CoV-2 nucleic acids MAY BE PRESENT.   A presumptive positive result was obtained on the submitted specimen  and confirmed on repeat testing.  While 2019 novel coronavirus  (SARS-CoV-2) nucleic acids may be present in the submitted sample  additional confirmatory testing may be necessary for epidemiological  and / or clinical management purposes  to differentiate between  SARS-CoV-2 and other Sarbecovirus currently known to infect humans.  If clinically indicated additional testing with an alternate test  methodology [phone]) is advised. The SARS-CoV-2 RNA is generally  detectable in upper and lower respiratory sp ecimens during the acute  phase of infection. The expected result is Negative. Fact Sheet for Patients:  StrictlyIdeas.no Fact Sheet for Healthcare Providers: BankingDealers.co.za This test is not yet approved or cleared by the [place] FDA and has been authorized for detection and/or diagnosis of SARS-CoV-2 by FDA under an Emergency Use Authorization (EUA).  This EUA will remain in effect (meaning this test can be used) for the duration of the COVID-19 declaration under Section  564(b)(1) of the Act, 21 U.S.C. section 360bbb-3(b)(1), unless the authorization is terminated or revoked sooner. Performed at [facility], [place] [address] [place], [place] [address]   Aerobic/Anaerobic Culture (surgical/deep wound)     Status: None (Preliminary result)   Collection Time: 12/31/18 12:35 AM   Specimen: Abscess  Result Value  Ref Range Status   Specimen Description   Final    ABSCESS RIGHT ELBOW Performed at [facility] [address], [place], [place] [address]    Special Requests   Final    NONE Performed at [facility], [place] [address] [place], [place] [address]    Gram Stain   Final    FEW WBC PRESENT, PREDOMINANTLY PMN NO ORGANISMS SEEN Performed at [facility], [place] [address], [place], [place] [address]    Culture PENDING  Incomplete   Report Status PENDING  Incomplete    Michel Bickers, MD [facility] for Infectious [place] [phone] pager   [phone] cell 12/31/2018, 3:59 PM

## 2018-12-31 NOTE — Progress Notes (Signed)
PROGRESS NOTE    CLARANCE Price  PQZ:[id number] DOB: 1970-10-28 DOA: 12/30/2018 PCP: Tracie Harrier, MD (Confirm with patient/family/NH records and if not entered, this HAS to be entered at [facility] point of entry. "No PCP" if truly none.)   Brief Narrative:  [name] is a 48 y.o. male with medical history significant for insulin-dependent diabetes mellitus, chronic kidney disease stage II, chronic right leg wound, hypertension, and history of right leg DVT on Xarelto, now presenting to the emergency department for evaluation of acute onset pain and swelling of the right elbow.  Patient reports he went to bed in his usual state last night but woke early this morning with right elbow pain.  He noticed some swelling around the elbow and has been having pain localized to the dorsal aspect.  He has not noted any fevers or chills, denies any preceding injury, did not have any wound near the elbow, and has not experienced this previously.  Reports that it has been months since he has taken his Xarelto due to running out.  He denies any recent fevers, chills, chest pain, cough, or shortness of breath.  Reports that his right leg wound continues to heal, but remains open at the lateral aspect near the ankle.  ED Course: Upon arrival to the ED, patient is found to be afebrile, saturating well on room air, and hypertensive to 170/100.  Chemistry panel features a creatinine 1.32, similar to priors, and CBC is unremarkable.  CRP is elevated to 5.4.  Plain radiographs of the right elbow are negative for fracture dislocation but notable for soft tissue swelling about the elbow and a small joint effusion.  Patient was treated with acetaminophen and oxycodone in the ED and hand surgery was consulted by the ED physician, recommended medical admission.   Assessment & Plan:   Principal Problem:   Septic arthritis of elbow, right (HCC) Active Problems:   Essential hypertension   Chronic anticoagulation  Type 2 diabetes mellitus with stage 2 chronic kidney disease, with long-term current use of insulin (HCC)   History of DVT (deep vein thrombosis)   Chronic venous insufficiency   Venous stasis ulcer (HCC)   Chronic kidney disease (CKD) stage G2/A2, mildly decreased glomerular filtration rate (GFR) between 60-89 mL/min/1.73 square meter and albuminuria creatinine ratio between 30-299 mg/g   1. Septic Arthritis R Elbow - s/p arthrotomy, I & D by orthopedics on 6/17 - 65,640 WBC's in joint fluid, 93% neutrophils, no crystals seen - 6/16 cx pending - 6/17 surgical cx pending - No blood cultures collected at admission - ID has been c/s, appreciate recommendations - Continue vancomycin for now, adjust abx per culture and ID recs  2. Insulin-dependent DM  - Repeat A1c, pending - Continue SSI, lantus at 15 units (decreased dose)  3. History of DVT  - History RLE DVT  - Reports that he has been off of Xarelto for ~3 months since running out  - Continue to hold Xarelto for now (DVT was in 2018)  4. Hypertension  - BP elevated in ED with pain likely contributing  - Continue pain-control, continue Norvasc, Lopressor, and hydralazine   DVT prophylaxis: SCD's, xarelto currently on hold Code Status: full code Family Communication: none at bedside Disposition Plan: pending further improvement, clearance by ortho and ID.  Culture data.  Requires inpatient management for continued IV abx and consultant clearance for d/c.   Consultants:   ID  Ortho  Procedures:  6/17 R elbow arthrotomy and irrigation  debridement for septic arthritis  Antimicrobials:  Anti-infectives (From admission, onward)   Start     Dose/Rate Route Frequency Ordered Stop   12/31/18 1600  vancomycin (VANCOCIN) IVPB 1000 mg/200 mL premix  Status:  Discontinued     1,000 mg 200 mL/hr over 60 Minutes Intravenous Every 12 hours 12/31/18 0448 12/31/18 1131   12/31/18 1600  vancomycin (VANCOCIN) 1,000 mg in sodium  chloride 0.9 % 250 mL IVPB     1,000 mg 250 mL/hr over 60 Minutes Intravenous Every 12 hours 12/31/18 1132     12/31/18 0130  vancomycin (VANCOCIN) 2,500 mg in sodium chloride 0.9 % 500 mL IVPB     2,500 mg 250 mL/hr over 120 Minutes Intravenous  Once 12/31/18 0116 12/31/18 0531   12/31/18 0104  polymyxin B 500,000 Units, bacitracin 50,000 Units in sodium chloride 0.9 % 500 mL irrigation  Status:  Discontinued       As needed 12/31/18 0104 12/31/18 0138   12/31/18 0019  ceFAZolin (ANCEF) 2-4 GM/100ML-% IVPB    Note to Pharmacy: [name]   : cabinet override      12/31/18 0019 12/31/18 1229         Subjective: Notes he had swelling to R elbow that started yesterday No known injury No recent dental work Has ulcer to RLE  Objective: Vitals:   12/31/18 0524 12/31/18 0852 12/31/18 1358 12/31/18 1428  BP: (!) 179/107 (!) 163/99 (!) 184/105 (!) 156/97  Pulse: 74 80 83   Resp: 18 16 18    Temp: 98.8 F (37.1 C) 98.9 F (37.2 C) 100.2 F (37.9 C) 99.1 F (37.3 C)  TempSrc: Oral Oral Oral Oral  SpO2: 100% 94% 95%   Weight:      Height:        Intake/Output Summary (Last 24 hours) at 12/31/2018 1532 Last data filed at 12/31/2018 1357 Gross per 24 hour  Intake 1670 ml  Output 715 ml  Net 955 ml   Filed Weights   12/30/18 1808  Weight: 115.7 kg    Examination:  General exam: Appears calm and comfortable  Respiratory system: Clear to auscultation. Respiratory effort normal. Cardiovascular system: S1 & S2 heard, RRR. Gastrointestinal system: Abdomen is nondistended, soft and nontender Central nervous system: Alert and oriented. No focal neurological deficits. Extremities: RUE with dressing intact, able to move all fingers Psychiatry: Judgement and insight appear normal. Mood & affect appropriate.     Data Reviewed: I have personally reviewed following labs and imaging studies  CBC: Recent Labs  Lab 12/30/18 1838 12/31/18 0422  WBC 8.7 8.1  NEUTROABS 6.5 5.9   HGB 16.1 15.3  HCT 49.6 48.5  MCV 91.7 93.6  PLT 258 875   Basic Metabolic Panel: Recent Labs  Lab 12/30/18 1838 12/31/18 0422  NA 138 139  K 4.2 3.9  CL 104 102  CO2 21* 24  GLUCOSE 162* 167*  BUN 18 17  CREATININE 1.32* 1.35*  CALCIUM 9.3 9.0   GFR: Estimated Creatinine Clearance: 87.5 mL/min (A) (by C-G formula based on SCr of 1.35 mg/dL (H)). Liver Function Tests: No results for input(s): AST, ALT, ALKPHOS, BILITOT, PROT, ALBUMIN in the last 168 hours. No results for input(s): LIPASE, AMYLASE in the last 168 hours. No results for input(s): AMMONIA in the last 168 hours. Coagulation Profile: No results for input(s): INR, PROTIME in the last 168 hours. Cardiac Enzymes: No results for input(s): CKTOTAL, CKMB, CKMBINDEX, TROPONINI in the last 168 hours. BNP (last 3 results)  No results for input(s): PROBNP in the last 8760 hours. HbA1C: No results for input(s): HGBA1C in the last 72 hours. CBG: Recent Labs  Lab 12/30/18 2359 12/31/18 0127 12/31/18 0402 12/31/18 0806 12/31/18 1148  GLUCAP 138* 152* 145* 144* 204*   Lipid Profile: No results for input(s): CHOL, HDL, LDLCALC, TRIG, CHOLHDL, LDLDIRECT in the last 72 hours. Thyroid Function Tests: No results for input(s): TSH, T4TOTAL, FREET4, T3FREE, THYROIDAB in the last 72 hours. Anemia Panel: No results for input(s): VITAMINB12, FOLATE, FERRITIN, TIBC, IRON, RETICCTPCT in the last 72 hours. Sepsis Labs: No results for input(s): PROCALCITON, LATICACIDVEN in the last 168 hours.  Recent Results (from the past 240 hour(s))  Body fluid culture     Status: None (Preliminary result)   Collection Time: 12/30/18  7:15 PM   Specimen: Synovium; Body Fluid  Result Value Ref Range Status   Specimen Description   Final    SYNOVIAL Performed at [facility] [address] [place], [place] [address]    Special Requests   Final    SYRINGE Performed at [facility], [place] [address]  [place], [place] [address]    Gram Stain   Final    ABUNDANT WBC PRESENT, PREDOMINANTLY PMN NO ORGANISMS SEEN Gram Stain Report Called to,Read Back By and Verified With: RN [facility] AT 2147 12/30/18 CRUICKSHANK A Performed at [facility], [place] [address] [place], [place] [address]    Culture   Final    NO GROWTH < 12 HOURS Performed at [facility] [address] [place], [place] [address]    Report Status PENDING  Incomplete  SARS Coronavirus 2 (CEPHEID - Performed in [facility] hospital lab), Hosp Order     Status: None   Collection Time: 12/30/18  9:39 PM   Specimen: Nasopharyngeal Swab  Result Value Ref Range Status   SARS Coronavirus 2 NEGATIVE NEGATIVE Final    Comment: (NOTE) If result is NEGATIVE SARS-CoV-2 target nucleic acids are NOT DETECTED. The SARS-CoV-2 RNA is generally detectable in upper and lower  respiratory specimens during the acute phase of infection. The lowest  concentration of SARS-CoV-2 viral copies this assay can detect is 250  copies / mL. A negative result does not preclude SARS-CoV-2 infection  and should not be used as the sole basis for treatment or other  patient management decisions.  A negative result may occur with  improper specimen collection / handling, submission of specimen other  than nasopharyngeal swab, presence of viral mutation(s) within the  areas targeted by this assay, and inadequate number of viral copies  (<250 copies / mL). A negative result must be combined with clinical  observations, patient history, and epidemiological information. If result is POSITIVE SARS-CoV-2 target nucleic acids are DETECTED. The SARS-CoV-2 RNA is generally detectable in upper and lower  respiratory specimens dur ing the acute phase of infection.  Positive  results are indicative of active infection with SARS-CoV-2.  Clinical  correlation with patient history and other diagnostic information is  necessary to determine  patient infection status.  Positive results do  not rule out bacterial infection or co-infection with other viruses. If result is PRESUMPTIVE POSTIVE SARS-CoV-2 nucleic acids MAY BE PRESENT.   A presumptive positive result was obtained on the submitted specimen  and confirmed on repeat testing.  While 2019 novel coronavirus  (SARS-CoV-2) nucleic acids may be present in the submitted sample  additional confirmatory testing may be necessary for epidemiological  and / or clinical management purposes  to differentiate between  SARS-CoV-2 and other Sarbecovirus currently known to infect humans.  If clinically indicated additional testing with an alternate test  methodology [phone]) is advised. The SARS-CoV-2 RNA is generally  detectable in upper and lower respiratory sp ecimens during the acute  phase of infection. The expected result is Negative. Fact Sheet for Patients:  StrictlyIdeas.no Fact Sheet for Healthcare Providers: BankingDealers.co.za This test is not yet approved or cleared by the [place] FDA and has been authorized for detection and/or diagnosis of SARS-CoV-2 by FDA under an Emergency Use Authorization (EUA).  This EUA will remain in effect (meaning this test can be used) for the duration of the COVID-19 declaration under Section 564(b)(1) of the Act, 21 U.S.C. section 360bbb-3(b)(1), unless the authorization is terminated or revoked sooner. Performed at [facility], [place] [address], [place], [place] [address]   Aerobic/Anaerobic Culture (surgical/deep wound)     Status: None (Preliminary result)   Collection Time: 12/31/18 12:35 AM   Specimen: Abscess  Result Value Ref Range Status   Specimen Description   Final    ABSCESS RIGHT ELBOW Performed at [facility] [address] [place], [place] [address]    Special Requests   Final    NONE Performed at [facility], [place] [address], [place], [place] [address]    Gram Stain   Final    FEW WBC PRESENT, PREDOMINANTLY PMN NO ORGANISMS SEEN Performed at [facility], [place] [address] [place], [place] [address]    Culture PENDING  Incomplete   Report Status PENDING  Incomplete         Radiology Studies: Dg Elbow Complete Right  Result Date: 12/30/2018 CLINICAL DATA:  Right elbow pain EXAM: RIGHT ELBOW - COMPLETE 3+ VIEW COMPARISON:  None. FINDINGS: There is no acute displaced fracture or dislocation. There is some mild soft tissue swelling about the posterior aspect of the elbow. There is a small joint effusion. There is no radiopaque foreign body. IMPRESSION: 1. No acute displaced fracture or dislocation. 2. Soft tissue swelling about the elbow. 3. Small joint effusion. Electronically Signed   By: Constance Holster M.D.   On: 12/30/2018 19:33        Scheduled Meds: . amLODipine  10 mg Oral Daily  . hydrALAZINE  50 mg Oral TID  . insulin aspart  0-9 Units Subcutaneous Q4H  . insulin glargine  15 Units Subcutaneous QHS  . metoprolol tartrate  100 mg Oral BID  . pantoprazole  40 mg Oral BID  . sodium chloride flush  3 mL Intravenous Q12H   Continuous Infusions: . sodium chloride    . vancomycin       LOS: 0 days    Time spent: over 30 min    Fayrene Helper, MD [facility] Pager AMION  If 7PM-7AM, please contact night-coverage [email or web address] Password [facility] 12/31/2018, 3:32 PM

## 2019-01-01 ENCOUNTER — Inpatient Hospital Stay: Payer: Self-pay

## 2019-01-01 LAB — CBC
HCT: 48.1 % (ref 39.0–52.0)
Hemoglobin: 15 g/dL (ref 13.0–17.0)
MCH: 28.8 pg (ref 26.0–34.0)
MCHC: 31.2 g/dL (ref 30.0–36.0)
MCV: 92.3 fL (ref 80.0–100.0)
Platelets: 225 10*3/uL (ref 150–400)
RBC: 5.21 MIL/uL (ref 4.22–5.81)
RDW: 12.7 % (ref 11.5–15.5)
WBC: 6.5 10*3/uL (ref 4.0–10.5)
nRBC: 0 % (ref 0.0–0.2)

## 2019-01-01 LAB — COMPREHENSIVE METABOLIC PANEL
ALT: 13 U/L (ref 0–44)
AST: 16 U/L (ref 15–41)
Albumin: 3.4 g/dL — ABNORMAL LOW (ref 3.5–5.0)
Alkaline Phosphatase: 72 U/L (ref 38–126)
Anion gap: 9 (ref 5–15)
BUN: 18 mg/dL (ref 6–20)
CO2: 23 mmol/L (ref 22–32)
Calcium: 8.6 mg/dL — ABNORMAL LOW (ref 8.9–10.3)
Chloride: 105 mmol/L (ref 98–111)
Creatinine, Ser: 1.36 mg/dL — ABNORMAL HIGH (ref 0.61–1.24)
GFR calc Af Amer: >60 mL/min (ref >60)
GFR calc non Af Amer: >60 mL/min (ref >60)
Glucose, Bld: 144 mg/dL — ABNORMAL HIGH (ref 70–99)
Potassium: 3.5 mmol/L (ref 3.5–5.1)
Sodium: 137 mmol/L (ref 135–145)
Total Bilirubin: 0.8 mg/dL (ref 0.3–1.2)
Total Protein: 7.8 g/dL (ref 6.5–8.1)

## 2019-01-01 LAB — GLUCOSE, CAPILLARY
Glucose-Capillary: 158 mg/dL — ABNORMAL HIGH (ref 70–99)
Glucose-Capillary: 116 mg/dL — ABNORMAL HIGH (ref 70–99)
Glucose-Capillary: 117 mg/dL — ABNORMAL HIGH (ref 70–99)
Glucose-Capillary: 131 mg/dL — ABNORMAL HIGH (ref 70–99)
Glucose-Capillary: 132 mg/dL — ABNORMAL HIGH (ref 70–99)

## 2019-01-01 LAB — C-REACTIVE PROTEIN: CRP: 13.7 mg/dL — ABNORMAL HIGH (ref <1.0)

## 2019-01-01 LAB — MAGNESIUM: Magnesium: 2 mg/dL (ref 1.7–2.4)

## 2019-01-01 LAB — GLUCOSE, BODY FLUID OTHER: Glucose, Body Fluid Other: 43 mg/dL

## 2019-01-01 LAB — PROTEIN, BODY FLUID (OTHER): Total Protein, Body Fluid Other: 4.7 g/dL

## 2019-01-01 MED ORDER — SODIUM CHLORIDE 0.9 % IV SOLN
2.0000 g | INTRAVENOUS | Status: DC
  Administered 2019-01-01 – 2019-01-02 (×2): 2 g via INTRAVENOUS
  Filled 2019-01-01 (×2): qty 2

## 2019-01-01 MED ORDER — SODIUM CHLORIDE 0.9% FLUSH: 10.0000 mL | INTRAVENOUS | Status: DC | PRN

## 2019-01-01 NOTE — Progress Notes (Signed)
Patient ID: [name], male   DOB: Sep 08, 1970, 48 y.o.   MRN: [id number]         [facility]  Date of Admission:  12/30/2018           Day 2 vancomycin ASSESSMENT: He has a septic right elbow.  So far, aspirate and operative cultures are negative.  I will add ceftriaxone to vancomycin pending final cultures.  I will go ahead and have a PICC placed in anticipation of at least 2 weeks of IV antibiotic therapy.  PLAN: 1. Continue vancomycin and start ceftriaxone pending final cultures 2. PICC placement  Diagnosis: Right elbow septic arthritis  Culture Result: Pending  No Known Allergies  OPAT Orders Discharge antibiotics: Per pharmacy protocol vancomycin and ceftriaxone Aim for Vancomycin trough 15-20 (unless otherwise indicated) Duration: 2 weeks End Date: 01/13/2019  [facility] Care Per Protocol:  Labs weekly while on IV antibiotics: _x_ CBC with differential _x_ BMP __ CMP _x_ CRP _x_ ESR _x_ Vancomycin trough __ CK  _x_ Please pull PIC at completion of IV antibiotics __ Please leave PIC in place until doctor has seen patient or been notified  Fax weekly labs to [phone]  Clinic Follow Up Appt: 01/12/2019   Principal Problem:   Septic arthritis of elbow, right ([place]) Active Problems:   Essential hypertension   Chronic anticoagulation   Type 2 diabetes mellitus with stage 2 chronic kidney disease, with long-term current use of insulin ([place])   History of DVT (deep vein thrombosis)   Chronic venous insufficiency   Venous stasis ulcer (HCC)   Chronic kidney disease (CKD) stage G2/A2, mildly decreased glomerular filtration rate (GFR) between 60-89 mL/min/1.73 square meter and albuminuria creatinine ratio between 30-299 mg/g   Scheduled Meds: . amLODipine  10 mg Oral Daily  . hydrALAZINE  50 mg Oral TID  . insulin aspart  0-5 Units Subcutaneous QHS  . insulin aspart  0-9 Units Subcutaneous TID WC  . insulin glargine  15  Units Subcutaneous QHS  . metoprolol tartrate  100 mg Oral BID  . pantoprazole  40 mg Oral BID  . sodium chloride flush  3 mL Intravenous Q12H   Continuous Infusions: . sodium chloride    . vancomycin 1,000 mg (01/01/19 0348)   PRN Meds:.sodium chloride, acetaminophen **OR** acetaminophen, cyclobenzaprine, HYDROcodone-acetaminophen, ketorolac, ondansetron **OR** ondansetron (ZOFRAN) IV, senna-docusate, sodium chloride flush   SUBJECTIVE: He is feeling better.  He has some residual soreness in his right elbow.  Review of Systems: Review of Systems  Constitutional: Negative for chills, diaphoresis and fever.  Musculoskeletal: Positive for joint pain.    No Known Allergies  OBJECTIVE: Vitals:   12/31/18 1358 12/31/18 1428 12/31/18 2034 01/01/19 0456  BP: (!) 184/105 (!) 156/97 (!) 156/97 (!) 148/87  Pulse: 83  73 73  Resp: _0 Temp: 100.2 F (37.9 C) 99.1 F (37.3 C) 98.2 F (36.8 C) 100.2 F (37.9 C)  TempSrc: Oral Oral Oral Oral  SpO2: 95%  92% 95%  Weight:      Height:       Body mass index is 35.57 kg/m.  Physical Exam Constitutional:      Comments: He is sitting up in bed.  He is in good spirits.  Musculoskeletal:     Comments: His surgical drain has been removed.  Psychiatric:        Mood and Affect: Mood normal.     Lab Results Lab Results  Component Value  Date   WBC 6.5 01/01/2019   HGB 15.0 01/01/2019   HCT 48.1 01/01/2019   MCV 92.3 01/01/2019   PLT 225 01/01/2019    Lab Results  Component Value Date   CREATININE 1.36 (H) 01/01/2019   BUN 18 01/01/2019   NA 137 01/01/2019   K 3.5 01/01/2019   CL 105 01/01/2019   CO2 23 01/01/2019    Lab Results  Component Value Date   ALT 13 01/01/2019   AST 16 01/01/2019   ALKPHOS 72 01/01/2019   BILITOT 0.8 01/01/2019     Microbiology: Recent Results (from the past 240 hour(s))  Body fluid culture     Status: None (Preliminary result)   Collection Time: 12/30/18  7:15 PM   Specimen:  Synovium; Body Fluid  Result Value Ref Range Status   Specimen Description   Final    SYNOVIAL Performed at [facility] [address] [place], [place] [address]    Special Requests   Final    SYRINGE Performed at [facility], [place] [address] [place], [place] [address]    Gram Stain   Final    ABUNDANT WBC PRESENT, PREDOMINANTLY PMN NO ORGANISMS SEEN Gram Stain Report Called to,Read Back By and Verified With: RN [facility] AT 2147 12/30/18 CRUICKSHANK A Performed at [facility], [place] [address] [place], [place] [address]    Culture   Final    NO GROWTH 2 DAYS Performed at [facility] [address], [place], [place] [address]    Report Status PENDING  Incomplete  SARS Coronavirus 2 (CEPHEID - Performed in [facility] hospital lab), Hosp Order     Status: None   Collection Time: 12/30/18  9:39 PM   Specimen: Nasopharyngeal Swab  Result Value Ref Range Status   SARS Coronavirus 2 NEGATIVE NEGATIVE Final    Comment: (NOTE) If result is NEGATIVE SARS-CoV-2 target nucleic acids are NOT DETECTED. The SARS-CoV-2 RNA is generally detectable in upper and lower  respiratory specimens during the acute phase of infection. The lowest  concentration of SARS-CoV-2 viral copies this assay can detect is 250  copies / mL. A negative result does not preclude SARS-CoV-2 infection  and should not be used as the sole basis for treatment or other  patient management decisions.  A negative result may occur with  improper specimen collection / handling, submission of specimen other  than nasopharyngeal swab, presence of viral mutation(s) within the  areas targeted by this assay, and inadequate number of viral copies  (<250 copies / mL). A negative result must be combined with clinical  observations, patient history, and epidemiological information. If result is POSITIVE SARS-CoV-2 target nucleic acids are DETECTED. The SARS-CoV-2 RNA is  generally detectable in upper and lower  respiratory specimens dur ing the acute phase of infection.  Positive  results are indicative of active infection with SARS-CoV-2.  Clinical  correlation with patient history and other diagnostic information is  necessary to determine patient infection status.  Positive results do  not rule out bacterial infection or co-infection with other viruses. If result is PRESUMPTIVE POSTIVE SARS-CoV-2 nucleic acids MAY BE PRESENT.   A presumptive positive result was obtained on the submitted specimen  and confirmed on repeat testing.  While 2019 novel coronavirus  (SARS-CoV-2) nucleic acids may be present in the submitted sample  additional confirmatory testing may be necessary for epidemiological  and / or clinical management purposes  to differentiate between  SARS-CoV-2 and other Sarbecovirus currently known to infect humans.  If clinically indicated additional testing with an alternate test  methodology [phone]) is advised. The SARS-CoV-2 RNA is generally  detectable in upper and lower respiratory sp ecimens during the acute  phase of infection. The expected result is Negative. Fact Sheet for Patients:  StrictlyIdeas.no Fact Sheet for Healthcare Providers: BankingDealers.co.za This test is not yet approved or cleared by the [place] FDA and has been authorized for detection and/or diagnosis of SARS-CoV-2 by FDA under an Emergency Use Authorization (EUA).  This EUA will remain in effect (meaning this test can be used) for the duration of the COVID-19 declaration under Section 564(b)(1) of the Act, 21 U.S.C. section 360bbb-3(b)(1), unless the authorization is terminated or revoked sooner. Performed at [facility], [place] [address] [place], [place] [address]   Aerobic/Anaerobic Culture (surgical/deep wound)     Status: None (Preliminary result)   Collection Time: 12/31/18 12:35  AM   Specimen: Abscess  Result Value Ref Range Status   Specimen Description   Final    ABSCESS RIGHT ELBOW Performed at [facility] [address] [place], [place] [address]    Special Requests   Final    NONE Performed at [facility], [place] [address], [place], [place] [address]    Gram Stain   Final    FEW WBC PRESENT, PREDOMINANTLY PMN NO ORGANISMS SEEN Performed at [facility], [place] [address] [place],  [address]    Culture PENDING  Incomplete   Report Status PENDING  Incomplete    Michel Bickers, MD [facility] [phone] pager   [phone] cell 01/01/2019, 10:03 AM

## 2019-01-01 NOTE — Progress Notes (Signed)
Peripherally Inserted Central Catheter/Midline Placement  The IV Nurse has discussed with the patient and/or persons authorized to consent for the patient, the purpose of this procedure and the potential benefits and risks involved with this procedure.  The benefits include less needle sticks, lab draws from the catheter, and the patient may be discharged home with the catheter. Risks include, but not limited to, infection, bleeding, blood clot (thrombus formation), and puncture of an artery; nerve damage and irregular heartbeat and possibility to perform a PICC exchange if needed/ordered by physician.  Alternatives to this procedure were also discussed.  Bard Power PICC patient education guide, fact sheet on infection prevention and patient information card has been provided to patient /or left at bedside.    PICC/Midline Placement Documentation        Darlyn Read 01/01/2019, 2:38 PM

## 2019-01-01 NOTE — Progress Notes (Signed)
Occupational Therapy Treatment Patient Details Name: OSINACHI NAVARRETTE MRN: [id number] DOB: 31-Oct-1970 Today's Date: 01/01/2019    History of present illness Pt was admitted with septic arthritis of R elbow, s/p I & D.  PMH:  DM, HTN, Gout and DVT   OT comments  Worked on R UE ROM; tolerating much better today and pt working on his own.  Educated on and issued long sponge, mostly to wash under LUE.  Pt is able to cross legs for LB adls. Will further assess if toilet aide would help   Follow Up Recommendations  Supervision - Intermittent    Equipment Recommendations  None recommended by OT    Recommendations for Other Services      Precautions / Restrictions Precautions Precaution Comments: gentle ROM R elbow Restrictions Other Position/Activity Restrictions: NWB RUE       Mobility Bed Mobility         Supine to sit: Supervision Sit to supine: Supervision   General bed mobility comments: HOB raised  Transfers Overall transfer level: Independent                    Balance                                           ADL either performed or assessed with clinical judgement   ADL           Upper Body Bathing: Minimal assistance;Sitting;With adaptive equipment   Lower Body Bathing: Min guard;Sit to/from stand       Lower Body Dressing: Minimal assistance;Sit to/from stand       Toileting- Water quality scientist and Hygiene: Minimal assistance         General ADL Comments: Issued long sponge to wash under L arm.  Pt can use with L hand better than R at this time. He is able to cross legs and don pants and socks.  Will further assess for toilet aide.  Pt following through for edema management.     Vision       Perception     Praxis      Cognition Arousal/Alertness: Awake/alert Behavior During Therapy: WFL for tasks assessed/performed Overall Cognitive Status: Within Functional Limits for tasks assessed                                           Exercises Other Exercises Other Exercises: gentle ROM for elbow.  Pt able to lift arm, move wrist and fingers.     Shoulder Instructions       General Comments      Pertinent Vitals/ Pain       Faces Pain Scale: Hurts a little bit Pain Location: RUE with movement Pain Descriptors / Indicators: Sore Pain Intervention(s): Limited activity within patient's tolerance;Monitored during session;Repositioned  Home Living                                          Prior Functioning/Environment              Frequency  Min 3X/week        Progress Toward Goals  OT Goals(current goals can now be found in  the care plan section)  Progress towards OT goals: Progressing toward goals     Plan      Co-evaluation                 AM-PAC OT "6 Clicks" Daily Activity     Outcome Measure   Help from another person eating meals?: A Little Help from another person taking care of personal grooming?: A Little Help from another person toileting, which includes using toliet, bedpan, or urinal?: A Little Help from another person bathing (including washing, rinsing, drying)?: A Little Help from another person to put on and taking off regular upper body clothing?: A Little Help from another person to put on and taking off regular lower body clothing?: A Little 6 Click Score: 18    End of Session    OT Visit Diagnosis: Pain Pain - Right/Left: Right Pain - part of body: Arm   Activity Tolerance Patient tolerated treatment well   Patient Left in bed;with call bell/phone within reach   Nurse Communication          Time: 1749-4496 OT Time Calculation (min): 16 min  Charges: OT General Charges $OT Visit: 1 Visit OT Treatments $Self Care/Home Management : 8-22 mins  Lesle Chris, OTR/L Acute Rehabilitation Services [phone] [facility] pager [phone] office 01/01/2019   [place] 01/01/2019, 2:47  PM

## 2019-01-01 NOTE — Anesthesia Postprocedure Evaluation (Signed)
Anesthesia Post Note  Patient: [name]  Procedure(s) Performed: MINOR INCISION AND DRAINAGE OF ABSCESS (Right )     Patient location during evaluation: PACU Anesthesia Type: General Level of consciousness: awake and alert Pain management: pain level controlled Vital Signs Assessment: post-procedure vital signs reviewed and stable Respiratory status: spontaneous breathing, nonlabored ventilation, respiratory function stable and patient connected to nasal cannula oxygen Cardiovascular status: blood pressure returned to baseline and stable Postop Assessment: no apparent nausea or vomiting Anesthetic complications: no    Last Vitals:  Vitals:   12/31/18 2034 01/01/19 0456  BP: (!) 156/97 (!) 148/87  Pulse: 73 73  Resp: 18 18  Temp: 36.8 C 37.9 C  SpO2: 92% 95%    Last Pain:  Vitals:   01/01/19 0900  TempSrc:   PainSc: 2                  Montez Hageman

## 2019-01-01 NOTE — Progress Notes (Signed)
   Ortho Hand Progress Note  Subjective: No acute events last night. Improved right elbow pain today   Objective: Vital signs in last 24 hours: Temp:  [98.2 F (36.8 C)-100.2 F (37.9 C)] 100.2 F (37.9 C) (06/18 0456) Pulse Rate:  [73-83] 73 (06/18 0456) Resp:  [16-18] 18 (06/18 0456) BP: (148-184)/(87-105) 148/87 (06/18 0456) SpO2:  [92 %-95 %] 95 % (06/18 0456)  Intake/Output from previous day: 06/17 0701 - 06/18 0700 In: 1580 [P.O.:1080; IV Piggyback:500] Out: 350 [Urine:300; Drains:50] Intake/Output this shift: No intake/output data recorded.  Recent Labs    12/30/18 1838 12/31/18 0422 01/01/19 0349  HGB 16.1 15.3 15.0   Recent Labs    12/31/18 0422 01/01/19 0349  WBC 8.1 6.5  RBC 5.18 5.21  HCT 48.5 48.1  PLT 193 225   Recent Labs    12/31/18 0422 01/01/19 0349  NA 139 137  K 3.9 3.5  CL 102 105  CO2 24 23  BUN 17 18  CREATININE 1.35* 1.36*  GLUCOSE 167* 144*  CALCIUM 9.0 8.6*   No results for input(s): LABPT, INR in the last 72 hours.  aaox3 nad resp nonlabored rrr RUE: Surgical dressings changed. Incision c/d/i. Drain with serosang output which was pulled. Improved elbow ROM from 30-90 but with pain. Motor intact ain/pin/u. SILT m/u/r. Fingers wwp with bcr.  Assessment/Plan: 48 yo M with right elbow septic arthritis, POD1 from Right elbow I&D  - Hospitalist primary. Appreciate management - Continue IV vanco. Appreciate ID input - Cxs NGTD - Drain pulled - OK for all ROM as tolerated. Encouraged him to work on regular elbow flexion and extension now that the drain is out - Elevate RUE for pain and swelling - Remainder per primary  - OK for d/c from my standpoint once abx plan has been determined. F/u with me in 10-14 days post op   Avanell Shackleton III 01/01/2019, 8:31 AM  [phone]

## 2019-01-01 NOTE — Progress Notes (Signed)
PROGRESS NOTE    [name]  PQZ:[id number] DOB: [birth year] DOA: 12/30/2018 PCP: [name] Harrier, MD (Confirm with patient/family/NH records and if not entered, this HAS to be entered at [facility] point of entry. "No PCP" if truly none.)   Brief Narrative:  [name] is [name] 48 y.o. male with medical history significant for insulin-dependent diabetes mellitus, chronic kidney disease stage II, chronic right leg wound, hypertension, and history of right leg DVT on Xarelto, now presenting to the emergency department for evaluation of acute onset pain and swelling of the right elbow.  Patient reports he went to bed in his usual state last night but woke early this morning with right elbow pain.  He noticed some swelling around the elbow and has been having pain localized to the dorsal aspect.  He has not noted any fevers or chills, denies any preceding injury, did not have any wound near the elbow, and has not experienced this previously.  Reports that it has been months since he has taken his Xarelto due to running out.  He denies any recent fevers, chills, chest pain, cough, or shortness of breath.  Reports that his right leg wound continues to heal, but remains open at the lateral aspect near the ankle.  ED Course: Upon arrival to the ED, patient is found to be afebrile, saturating well on room air, and hypertensive to 170/100.  Chemistry panel features [name] creatinine 1.32, similar to priors, and CBC is unremarkable.  CRP is elevated to 5.4.  Plain radiographs of the right elbow are negative for fracture dislocation but notable for soft tissue swelling about the elbow and [name] small joint effusion.  Patient was treated with acetaminophen and oxycodone in the ED and hand surgery was consulted by the ED physician, recommended medical admission.   Assessment & Plan:   Principal Problem:   Septic arthritis of elbow, right (HCC) Active Problems:   Essential hypertension   Chronic anticoagulation  Type 2 diabetes mellitus with stage 2 chronic kidney disease, with long-term current use of insulin (HCC)   History of DVT (deep vein thrombosis)   Chronic venous insufficiency   Venous stasis ulcer (HCC)   Chronic kidney disease (CKD) stage G2/A2, mildly decreased glomerular filtration rate (GFR) between 60-89 mL/min/1.73 square meter and albuminuria creatinine ratio between 30-299 mg/g   1. Septic Arthritis R Elbow - s/p arthrotomy, I & D by orthopedics on 6/17 - 65,640 WBC's in joint fluid, 93% neutrophils, no crystals seen - 6/16 cx pending - 6/17 surgical cx pending - No blood cultures collected at admission - ID has been c/s, appreciate recommendations - PICC, vanc/ceftriaxone  2. Insulin-dependent DM  - Repeat A1c, pending - Continue SSI, lantus at 15 units (decreased dose)  3. History of DVT  - History RLE DVT  - Reports that he has been off of Xarelto for ~3 months since running out  - Continue to hold Xarelto for now (DVT was in 2018)  4. Hypertension  - BP elevated in ED with pain likely contributing  - Continue pain-control, continue Norvasc, Lopressor, and hydralazine   Chronic Right LE ulcer: wound care per wound care c/s, appreciate recs  DVT prophylaxis: SCD's, xarelto currently on hold Code Status: full code Family Communication: none at bedside Disposition Plan: pending further improvement, clearance by ortho and ID.  Culture data.  Requires inpatient management for continued IV abx and consultant clearance for d/c.   Consultants:   ID  Ortho  Procedures:  6/17 R  elbow arthrotomy and irrigation debridement for septic arthritis  Antimicrobials:  Anti-infectives (From admission, onward)   Start     Dose/Rate Route Frequency Ordered Stop   01/01/19 1100  cefTRIAXone (ROCEPHIN) 2 g in sodium chloride 0.9 % 100 mL IVPB     2 g 200 mL/hr over 30 Minutes Intravenous Every 24 hours 01/01/19 1015     12/31/18 1600  vancomycin (VANCOCIN) IVPB 1000 mg/200  mL premix  Status:  Discontinued     1,000 mg 200 mL/hr over 60 Minutes Intravenous Every 12 hours 12/31/18 0448 12/31/18 1131   12/31/18 1600  vancomycin (VANCOCIN) 1,000 mg in sodium chloride 0.9 % 250 mL IVPB     1,000 mg 250 mL/hr over 60 Minutes Intravenous Every 12 hours 12/31/18 1132     12/31/18 0130  vancomycin (VANCOCIN) 2,500 mg in sodium chloride 0.9 % 500 mL IVPB     2,500 mg 250 mL/hr over 120 Minutes Intravenous  Once 12/31/18 0116 12/31/18 0531   12/31/18 0104  polymyxin B 500,000 Units, bacitracin 50,000 Units in sodium chloride 0.9 % 500 mL irrigation  Status:  Discontinued       As needed 12/31/18 0104 12/31/18 0138   12/31/18 0019  ceFAZolin (ANCEF) 2-4 GM/100ML-% IVPB    Note to Pharmacy: [name]   : cabinet override      12/31/18 0019 12/31/18 1229         Subjective: Feels better.  Objective: Vitals:   12/31/18 1358 12/31/18 1428 12/31/18 2034 01/01/19 0456  BP: (!) 184/105 (!) 156/97 (!) 156/97 (!) 148/87  Pulse: 83  73 73  Resp: 18  18 18   Temp: 100.2 F (37.9 C) 99.1 F (37.3 C) 98.2 F (36.8 C) 100.2 F (37.9 C)  TempSrc: Oral Oral Oral Oral  SpO2: 95%  92% 95%  Weight:      Height:        Intake/Output Summary (Last 24 hours) at 01/01/2019 1635 Last data filed at 01/01/2019 1458 Gross per 24 hour  Intake 1460 ml  Output 535 ml  Net 925 ml   Filed Weights   12/30/18 1808  Weight: 115.7 kg    Examination:  General: No acute distress. Cardiovascular: Heart sounds show [name] regular rate, and rhythm. N Lungs: Clear to auscultation bilaterally . Abdomen: Soft, nontender, nondistended  Neurological: Alert and oriented 3. Moves all extremities 4 . Cranial nerves II through XII grossly intact. Skin: Warm and dry. No rashes or lesions. Extremities: RUE with dressing intact, drain removed.  RLE with intact dressing (discussed with pt to let me know when dressing to be changed)   Data Reviewed: I have personally reviewed following  labs and imaging studies  CBC: Recent Labs  Lab 12/30/18 1838 12/31/18 0422 01/01/19 0349  WBC 8.7 8.1 6.5  NEUTROABS 6.5 5.9  --   HGB 16.1 15.3 15.0  HCT 49.6 48.5 48.1  MCV 91.7 93.6 92.3  PLT 258 193 240   Basic Metabolic Panel: Recent Labs  Lab 12/30/18 1838 12/31/18 0422 01/01/19 0349  NA 138 139 137  K 4.2 3.9 3.5  CL 104 102 105  CO2 21* 24 23  GLUCOSE 162* 167* 144*  BUN 18 17 18   CREATININE 1.32* 1.35* 1.36*  CALCIUM 9.3 9.0 8.6*  MG  --   --  2.0   GFR: Estimated Creatinine Clearance: 86.9 mL/min (Kerrick Miler) (by C-G formula based on SCr of 1.36 mg/dL (H)). Liver Function Tests: Recent Labs  Lab 01/01/19 [phone]  AST 16  ALT 13  ALKPHOS 72  BILITOT 0.8  PROT 7.8  ALBUMIN 3.4*   No results for input(s): LIPASE, AMYLASE in the last 168 hours. No results for input(s): AMMONIA in the last 168 hours. Coagulation Profile: No results for input(s): INR, PROTIME in the last 168 hours. Cardiac Enzymes: No results for input(s): CKTOTAL, CKMB, CKMBINDEX, TROPONINI in the last 168 hours. BNP (last 3 results) No results for input(s): PROBNP in the last 8760 hours. HbA1C: Recent Labs    12/31/18 1559  HGBA1C 6.5*   CBG: Recent Labs  Lab 12/31/18 2036 12/31/18 2332 01/01/19 0345 01/01/19 0815 01/01/19 1145  GLUCAP 180* 139* 158* 116* 117*   Lipid Profile: No results for input(s): CHOL, HDL, LDLCALC, TRIG, CHOLHDL, LDLDIRECT in the last 72 hours. Thyroid Function Tests: No results for input(s): TSH, T4TOTAL, FREET4, T3FREE, THYROIDAB in the last 72 hours. Anemia Panel: No results for input(s): VITAMINB12, FOLATE, FERRITIN, TIBC, IRON, RETICCTPCT in the last 72 hours. Sepsis Labs: No results for input(s): PROCALCITON, LATICACIDVEN in the last 168 hours.  Recent Results (from the past 240 hour(s))  Body fluid culture     Status: None (Preliminary result)   Collection Time: 12/30/18  7:15 PM   Specimen: Synovium; Body Fluid  Result Value Ref Range Status    Specimen Description   Final    SYNOVIAL Performed at [place] [address], [place], [place] [address]    Special Requests   Final    SYRINGE Performed at [facility], [place] [address] [place], [place] [address]    Gram Stain   Final    ABUNDANT WBC PRESENT, PREDOMINANTLY PMN NO ORGANISMS SEEN Gram Stain Report Called to,Read Back By and Verified With: RN [facility] AT 2147 12/30/18 CRUICKSHANK Tyran Huser Performed at [facility], [place] [address], [place], [place] [address]    Culture   Final    NO GROWTH 2 DAYS Performed at [place] [address], [place], [place] [address]    Report Status PENDING  Incomplete  SARS Coronavirus 2 (CEPHEID - Performed in [place] hospital lab), Hosp Order     Status: None   Collection Time: 12/30/18  9:39 PM   Specimen: Nasopharyngeal Swab  Result Value Ref Range Status   SARS Coronavirus 2 NEGATIVE NEGATIVE Final    Comment: (NOTE) If result is NEGATIVE SARS-CoV-2 target nucleic acids are NOT DETECTED. The SARS-CoV-2 RNA is generally detectable in upper and lower  respiratory specimens during the acute phase of infection. The lowest  concentration of SARS-CoV-2 viral copies this assay can detect is 250  copies / mL. Jacey Pelc negative result does not preclude SARS-CoV-2 infection  and should not be used as the sole basis for treatment or other  patient management decisions.  Joaquina Nissen negative result may occur with  improper specimen collection / handling, submission of specimen other  than nasopharyngeal swab, presence of viral mutation(s) within the  areas targeted by this assay, and inadequate number of viral copies  (<250 copies / mL). Alisha Bacus negative result must be combined with clinical  observations, patient history, and epidemiological information. If result is POSITIVE SARS-CoV-2 target nucleic acids are DETECTED. The SARS-CoV-2 RNA is generally detectable in upper and lower  respiratory  specimens dur ing the acute phase of infection.  Positive  results are indicative of active infection with SARS-CoV-2.  Clinical  correlation with patient history and other diagnostic information is  necessary to  determine patient infection status.  Positive results do  not rule out bacterial infection or co-infection with other viruses. If result is PRESUMPTIVE POSTIVE SARS-CoV-2 nucleic acids MAY BE PRESENT.   Ceniya Fowers presumptive positive result was obtained on the submitted specimen  and confirmed on repeat testing.  While 2019 novel coronavirus  (SARS-CoV-2) nucleic acids may be present in the submitted sample  additional confirmatory testing may be necessary for epidemiological  and / or clinical management purposes  to differentiate between  SARS-CoV-2 and other Sarbecovirus currently known to infect humans.  If clinically indicated additional testing with an alternate test  methodology [phone]) is advised. The SARS-CoV-2 RNA is generally  detectable in upper and lower respiratory sp ecimens during the acute  phase of infection. The expected result is Negative. Fact Sheet for Patients:  StrictlyIdeas.no Fact Sheet for Healthcare Providers: BankingDealers.co.za This test is not yet approved or cleared by the [place] FDA and has been authorized for detection and/or diagnosis of SARS-CoV-2 by FDA under an Emergency Use Authorization (EUA).  This EUA will remain in effect (meaning this test can be used) for the duration of the COVID-19 declaration under Section 564(b)(1) of the Act, 21 U.S.C. section 360bbb-3(b)(1), unless the authorization is terminated or revoked sooner. Performed at [facility], [place] [address], [place], [place] [address]   Aerobic/Anaerobic Culture (surgical/deep wound)     Status: None (Preliminary result)   Collection Time: 12/31/18 12:35 AM   Specimen: Abscess  Result Value Ref Range Status    Specimen Description   Final    ABSCESS RIGHT ELBOW Performed at [place] [address] [place], [place] [address]    Special Requests   Final    NONE Performed at [facility], [place] [address] [place], [place] [address]    Gram Stain   Final    FEW WBC PRESENT, PREDOMINANTLY PMN NO ORGANISMS SEEN    Culture   Final    NO GROWTH 1 DAY Performed at [place] [address] [place], [place] [address]    Report Status PENDING  Incomplete         Radiology Studies: Dg Elbow Complete Right  Result Date: 12/30/2018 CLINICAL DATA:  Right elbow pain EXAM: RIGHT ELBOW - COMPLETE 3+ VIEW COMPARISON:  None. FINDINGS: There is no acute displaced fracture or dislocation. There is some mild soft tissue swelling about the posterior aspect of the elbow. There is Mena Simonis small joint effusion. There is no radiopaque foreign body. IMPRESSION: 1. No acute displaced fracture or dislocation. 2. Soft tissue swelling about the elbow. 3. Small joint effusion. Electronically Signed   By: Constance Holster M.D.   On: 12/30/2018 19:33   [place] Ekg Site Rite  Result Date: 01/01/2019 If Site Rite image not attached, placement could not be confirmed due to current cardiac rhythm.       Scheduled Meds: . amLODipine  10 mg Oral Daily  . hydrALAZINE  50 mg Oral TID  . insulin aspart  0-5 Units Subcutaneous QHS  . insulin aspart  0-9 Units Subcutaneous TID WC  . insulin glargine  15 Units Subcutaneous QHS  . metoprolol tartrate  100 mg Oral BID  . pantoprazole  40 mg Oral BID  . sodium chloride flush  3 mL Intravenous Q12H   Continuous Infusions: . sodium chloride    . cefTRIAXone (ROCEPHIN)  IV 2 g (01/01/19 1458)  . vancomycin 1,000 mg (01/01/19 0348)  LOS: 1 day    Time spent: over 30 min    Fayrene Helper, MD [facility] Pager AMION  If 7PM-7AM, please contact night-coverage [email or web address] Password [facility] 01/01/2019, 4:35 PM

## 2019-01-02 LAB — BODY FLUID CULTURE: Culture: NO GROWTH

## 2019-01-02 LAB — CBC
HCT: 41.2 % (ref 39.0–52.0)
Hemoglobin: 13.5 g/dL (ref 13.0–17.0)
MCH: 29.7 pg (ref 26.0–34.0)
MCHC: 32.8 g/dL (ref 30.0–36.0)
MCV: 90.5 fL (ref 80.0–100.0)
Platelets: 233 10*3/uL (ref 150–400)
RBC: 4.55 MIL/uL (ref 4.22–5.81)
RDW: 12.6 % (ref 11.5–15.5)
WBC: 5.7 10*3/uL (ref 4.0–10.5)
nRBC: 0 % (ref 0.0–0.2)

## 2019-01-02 LAB — COMPREHENSIVE METABOLIC PANEL
ALT: 12 U/L (ref 0–44)
AST: 15 U/L (ref 15–41)
Albumin: 3 g/dL — ABNORMAL LOW (ref 3.5–5.0)
Alkaline Phosphatase: 63 U/L (ref 38–126)
Anion gap: 10 (ref 5–15)
BUN: 21 mg/dL — ABNORMAL HIGH (ref 6–20)
CO2: 25 mmol/L (ref 22–32)
Calcium: 8.4 mg/dL — ABNORMAL LOW (ref 8.9–10.3)
Chloride: 102 mmol/L (ref 98–111)
Creatinine, Ser: 1.46 mg/dL — ABNORMAL HIGH (ref 0.61–1.24)
GFR calc Af Amer: >60 mL/min (ref >60)
GFR calc non Af Amer: 56 mL/min — ABNORMAL LOW (ref >60)
Glucose, Bld: 127 mg/dL — ABNORMAL HIGH (ref 70–99)
Potassium: 3.6 mmol/L (ref 3.5–5.1)
Sodium: 137 mmol/L (ref 135–145)
Total Bilirubin: 0.6 mg/dL (ref 0.3–1.2)
Total Protein: 7.1 g/dL (ref 6.5–8.1)

## 2019-01-02 LAB — CREATININE, SERUM
Creatinine, Ser: 1.44 mg/dL — ABNORMAL HIGH (ref 0.61–1.24)
GFR calc Af Amer: >60 mL/min (ref >60)
GFR calc non Af Amer: 57 mL/min — ABNORMAL LOW (ref >60)

## 2019-01-02 LAB — GLUCOSE, CAPILLARY
Glucose-Capillary: 116 mg/dL — ABNORMAL HIGH (ref 70–99)
Glucose-Capillary: 121 mg/dL — ABNORMAL HIGH (ref 70–99)
Glucose-Capillary: 133 mg/dL — ABNORMAL HIGH (ref 70–99)

## 2019-01-02 LAB — C-REACTIVE PROTEIN: CRP: 10.8 mg/dL — ABNORMAL HIGH (ref <1.0)

## 2019-01-02 LAB — MAGNESIUM: Magnesium: 2 mg/dL (ref 1.7–2.4)

## 2019-01-02 LAB — VANCOMYCIN, TROUGH: Vancomycin Tr: 16 ug/mL (ref 15–20)

## 2019-01-02 LAB — VANCOMYCIN, PEAK: Vancomycin Pk: 28 ug/mL — ABNORMAL LOW (ref 30–40)

## 2019-01-02 MED ORDER — CEFTRIAXONE IV (FOR PTA / DISCHARGE USE ONLY): 2.0000 g | INTRAVENOUS | 0 refills | Status: DC

## 2019-01-02 MED ORDER — VANCOMYCIN IV (FOR PTA / DISCHARGE USE ONLY): 1000.0000 mg | Freq: Two times a day (BID) | INTRAVENOUS | 0 refills | Status: DC

## 2019-01-02 MED ORDER — HYDRALAZINE HCL 50 MG PO TABS: 50.0000 mg | ORAL_TABLET | Freq: Three times a day (TID) | ORAL | 0 refills | Status: DC

## 2019-01-02 NOTE — Progress Notes (Signed)
Occupational Therapy Treatment; d/c from OT Patient Details Name: [name] MRN: [id number] DOB: [birth year] Today's Date: 01/02/2019    History of present illness Pt was admitted with septic arthritis of R elbow, s/p I & D.  PMH:  DM, HTN, Gout and DVT   OT comments  Pt doing well and now able to feed himself. He is following through with gentle ROM and positioning.  Issued long sponge and toilet aide for increased independence with adls.  No further OT is needed at this time  Follow Up Recommendations  Supervision - Intermittent   Pt will follow up with Dr Jeannie Fend for further needs   Equipment Recommendations  None recommended by OT    Recommendations for Other Services      Precautions / Restrictions Precautions Precaution Comments: gentle ROM as tolerated throughout Restrictions Weight Bearing Restrictions: No       Mobility Bed Mobility               General bed mobility comments: oob  Transfers Overall transfer level: Independent                    Balance                                           ADL either performed or assessed with clinical judgement   ADL                               Toileting- Clothing Manipulation and Hygiene: Modified independent         General ADL Comments: provided toilet aide for toileting.  Pt used long sponge to wash under L UE.  Recommended he sponge bathe at home as he now has PICC line     Vision       Perception     Praxis      Cognition Arousal/Alertness: Awake/alert Behavior During Therapy: WFL for tasks assessed/performed Overall Cognitive Status: Within Functional Limits for tasks assessed                                          Exercises     Shoulder Instructions       General Comments pt is following through with ROM, positioning on his own    Pertinent Vitals/ Pain       Pain Assessment: Faces Faces Pain Scale: Hurts a  little bit Pain Location: RUE with movement Pain Descriptors / Indicators: Sore Pain Intervention(s): Limited activity within patient's tolerance  Home Living                                          Prior Functioning/Environment              Frequency           Progress Toward Goals  OT Goals(current goals can now be found in the care plan section)  Progress towards OT goals: Goals met/education completed, patient discharged from [place]  AM-PAC OT "6 Clicks" Daily Activity     Outcome Measure   Help from another person eating meals?: None Help from another person taking care of personal grooming?: None Help from another person toileting, which includes using toliet, bedpan, or urinal?: None Help from another person bathing (including washing, rinsing, drying)?: None Help from another person to put on and taking off regular upper body clothing?: None Help from another person to put on and taking off regular lower body clothing?: None 6 Click Score: 24    End of Session    OT Visit Diagnosis: Pain Pain - Right/Left: Right Pain - part of body: Arm   Activity Tolerance     Patient Left     Nurse Communication          Time: 5612-5483 OT Time Calculation (min): 16 min  Charges: OT General Charges $OT Visit: 1 Visit OT Treatments $Self Care/Home Management : 8-22 mins  Lesle Chris, OTR/L Acute Rehabilitation Services [phone] [facility] pager [phone] office 01/02/2019   [place] 01/02/2019, 11:27 AM

## 2019-01-02 NOTE — Progress Notes (Signed)
Pharmacy Brief Note - Vancomycin Consult:   For full note/review see note by Doreene Eland, PharmD.   Assessment:   Vancomycin trough = 16 mcg/mL; Calculated AUC = 526 - both of which are within therapeutic goal range   Plan:  OPAT consult completed.   Patient to discharge on current regimen of vancomycin 1000 mg IV q12h + CTX 2 g IV daily  Patient to receive vancomycin dose in hospital this evening prior to discharge   Lenis Noon, PharmD 01/02/19 3:53 PM

## 2019-01-02 NOTE — Progress Notes (Addendum)
Pharmacy Antibiotic Note  [name] is a 48 y.o. male admitted on 12/30/2018 with right elbow septic arthritis. Pharmacy has been consulted for vancomycin dosing.    6/17 I&D performed, intra-op cx unrevealing 6/18 Ceftriaxone added per ID, vanco peak and trough ordered for 6/19  Today, 01/02/2019 D3 vanco - Scr slightly increased 1.35 to 1.45 - significance unknown. Difficult to assess true UOP - Vancomycin 1gm IV q12 (dose given at 03:59 this am) - vancomycin peak = 28 mcg/ml (@ 06:34) - vancomycin trough (ordered for 15:00)   Plan: -Continue Vancomycin 1000 mg IV Q 12 hrs. Goal AUC 400-550. Levels pending to calculate AUC Expected AUC: 527.4 31.7/14.5 - MOnitor SCr closely - order daily SCr for now  - although no doses given, consider stopping toradol  Height: 5\' 11"  (180.3 cm) Weight: 255 lb (115.7 kg) IBW/kg (Calculated) : 75.3  Temp (24hrs), Avg:98.6 F (37 C), Min:98.5 F (36.9 C), Max:98.7 F (37.1 C)  Recent Labs  Lab 12/30/18 1838 12/31/18 0422 01/01/19 0349 01/02/19 0634  WBC 8.7 8.1 6.5 5.7  CREATININE 1.32* 1.35* 1.36* 1.46*  1.44*  VANCOPEAK  --   --   --  28*    Estimated Creatinine Clearance: 81 mL/min (A) (by C-G formula based on SCr of 1.46 mg/dL (H)).    No Known Allergies  Antimicrobials this admission: 6/17 vanc>> 6/17 ancef x 1 in OR 6/18 ceftriaxone >> Dose adjustments this admission:  Microbiology results: 6/16 SARS-CoV2 negative 6/16 synovial fluid: NGTD 6/17 Surgical cx aerobic/anaerobic: NG 6/17 surgical cx anaerobic:NG  Thank you for allowing pharmacy to be a part of this patient's care.  Doreene Eland, PharmD, BCPS.   Work Cell: [phone] 01/02/2019 9:05 AM

## 2019-01-02 NOTE — TOC Progression Note (Addendum)
Transition of Care [facility]) - Progression Note    Patient Details  Name: [name] MRN: [id number] Date of Birth: Jan 13, 1971  Transition of [facility]) CM/SW Contact  Leeroy Cha, RN Phone Number: 01/02/2019, 12:52 PM  Clinical Narrative:    [facility] this week case sent to [facility] at 1252/ [facility] with do the Nursing part/ tct-Pam Tamera Punt with [facility]/will do the iv abx, will be over later after the trough is done to get doses.        Expected Discharge Plan and Services                                                 Social Determinants of Health (SDOH) Interventions    Readmission Risk Interventions No flowsheet data found.

## 2019-01-02 NOTE — Discharge Summary (Signed)
Physician Discharge Summary  [name] VQM:[id number] DOB: Jan 10, 1971 DOA: 12/30/2018  PCP: Tracie Harrier, MD  Admit date: 12/30/2018 Discharge date: 01/02/2019  Time spent: 40 minutes  Recommendations for Outpatient Follow-up:  1. Follow outpatient CBC/CMP 2. Follow up with orthopedics outpatient 3. Follow up with ID outpatient 4. Continue antibiotics per ID, remove PICC once abx complete 5. Follow cultures, adjust abx per ID as needed 6. Pt previously on xarelto for hx of RLE DVT.  Please establish with new PCP and discuss long term plan regarding anticoagulation going forward.  7. Continue wound care as outpatient   Discharge Diagnoses:  Principal Problem:   Septic arthritis of elbow, right ([place]) Active Problems:   Essential hypertension   Chronic anticoagulation   Type 2 diabetes mellitus with stage 2 chronic kidney disease, with long-term current use of insulin (HCC)   History of DVT (deep vein thrombosis)   Chronic venous insufficiency   Venous stasis ulcer (HCC)   Chronic kidney disease (CKD) stage G2/A2, mildly decreased glomerular filtration rate (GFR) between 60-89 mL/min/1.73 square meter and albuminuria creatinine ratio between 30-299 mg/g   Discharge Condition: stable  Diet recommendation: heart healthy  Filed Weights   12/30/18 1808  Weight: 115.7 kg    History of present illness:  [name] 48 y.o.malewith medical history significant forinsulin-dependent diabetes mellitus, chronic kidney disease stage II,chronic right leg wound,hypertension, and history of right leg DVT on Xarelto, now presenting to the emergency department for evaluation of acute onset pain and swelling of the right elbow.Patient reports he went to bed in his usual state last night but woke early this morning with right elbow pain. He noticed some swelling around the elbow and has been having pain localized to the dorsal aspect. He has not noted any fevers or  chills, denies any preceding injury, did not have any wound near the elbow, and has not experienced this previously. Reports that it has been months since he has taken his Xarelto due to running out. He denies any recent fevers, chills, chest pain, cough, or shortness of breath. Reports that his right leg wound continues to heal, but remains open at the lateral aspect near the ankle.  ED Course:Upon arrival to the ED, patient is found to be afebrile, saturating well on room air, and hypertensive to 170/100. Chemistry panel features Syana Degraffenreid creatinine 1.32, similar to priors, and CBC is unremarkable. CRP is elevated to 5.4. Plain radiographs of the right elbow are negative for fracture dislocation but notable for soft tissue swelling about the elbow and Yitta Gongaware small joint effusion. Patient was treated with acetaminophen and oxycodone in the ED and hand surgery was consulted by the ED physician, recommended medical admission.  He was admitted for R elbow septic arthritis.  Now s/p I&D with ortho.  ID following and pt d/c'd on vanc/ceftriaxone.  Final cx pending.    See below for further details  Hospital Course:  .Septic Arthritis R Elbow - s/p arthrotomy, I & D by orthopedics on 6/17 - 65,640 WBC's in joint fluid, 93% neutrophils, no crystals seen - 6/16 cx no growth - 6/17 surgical cx pending - No blood cultures collected at admission - ID has been c/s, appreciate recommendations - PICC, vanc/ceftriaxone - discharge on vanc/ceftriaxone, ID to follow final cx.  F/u with ID on 01/12/2019.  2.Insulin-dependent DM -Repeat A1c, pending - 6.5 -Continue SSI, lantus  3.History of DVT  - History RLE DVT -Reports that he has been off of Xarelto for ~  3 months since running out -Continue to hold Xarelto for now (DVT was in 2018).  Discussed with patient.  Could restart anticoagulation now, but pt without insurance and cost of xarelto prohibitive at this point.  Discussed options, at this point,  recommend f/u with PCP and discuss long term anticoagulation plan.  His DVT seems like it was unprovoked based on discussion with him and review of chart (note from 03/2017).  Discussed could continue anticoagulation indefinitely and refill, but given issues with cost and need to establish pcp and several months off already, will defer discussion to PCP so they can select medication that can be continued uninterrupted based on cost/assistance programs.  4.Hypertension  - BP elevated in ED with pain likely contributing -Continue pain-control, continue Norvasc, Lopressor, and hydralazine   Chronic Right LE ulcer: wound care per wound care c/s, appreciate recs Wound care to right lateral LE ulcer, full thickness:  Cleanse with NS, pat dry. Cover with xeroform gauze Kellie Simmering # 294). Top with dry gauze 4x4s, wrap from toe to knee with Kerlix roll gauze and top with ACE bandage wrapped in Jaece Ducharme similar manner. Change twice daily.  Procedures: 6/17 R elbow arthrotomy and irrigation debridement for septic arthritis  Consultations:  ID  orthopedics  Discharge Exam: Vitals:   01/02/19 1717 01/02/19 1733  BP: (!) 196/99 140/90  Pulse: 69 72  Resp:  18  Temp:    SpO2:     Feels ready for d/c Discussed d/c plan and recs  General: No acute distress. Cardiovascular: Heart sounds show Syla Devoss regular rate, and rhythm.  Lungs: Clear to auscultation bilaterally Abdomen: Soft, nontender, nondistended  Neurological: Alert and oriented 3. Moves all extremities 4. Cranial nerves II through XII grossly intact. Skin: Warm and dry. No rashes or lesions. Extremities: No clubbing or cyanosis. No edema.  RLE ulcer, does not appear infected.  RUE in dressing.  PICC on L Discharge Instructions   Discharge Instructions    Call MD for:  difficulty breathing, headache or visual disturbances   Complete by: As directed    Call MD for:  extreme fatigue   Complete by: As directed    Call MD for:  hives   Complete  by: As directed    Call MD for:  persistant dizziness or light-headedness   Complete by: As directed    Call MD for:  persistant nausea and vomiting   Complete by: As directed    Call MD for:  redness, tenderness, or signs of infection (pain, swelling, redness, odor or green/yellow discharge around incision site)   Complete by: As directed    Call MD for:  severe uncontrolled pain   Complete by: As directed    Call MD for:  temperature >100.4   Complete by: As directed    Diet - low sodium heart healthy   Complete by: As directed    Discharge instructions   Complete by: As directed    You were seen for septic arthritis of the right arm.  You were treated surgically by orthopedics and we've started you on antibiotics.  Please follow up with orthopedics and infectious disease as an outpatient as scheduled.  Please follow up with your primary care doctor for wound care.  Follow wound care recs as noted in your discharge information.    You've been off anticoagulation for several months now.  I'd like you to discuss anticoagulation with your new PCP and whether or not you should restart your xarelto.  Please follow  up this discussion with your PCP.  Return for new, recurrent, or worsening symptoms.  Please ask your PCP to request records from this hospitalization so they know what was done and what the next steps will be.   Discharge wound care:   Complete by: As directed    Wound care to right lateral LE ulcer, full thickness:  Cleanse with NS, pat dry. Cover with xeroform gauze Kellie Simmering # [id number]). Top with dry gauze 4x4s, wrap from toe to knee with Kerlix roll gauze and top with ACE bandage wrapped in Aleea Hendry similar manner. Change twice daily.   Home infusion instructions [facility] May follow [place] Dosing Protocol; May administer Cathflo as needed to maintain patency of vascular access device.; [place]ushing of vascular access device: per [facility] Protocol: 0.9% NaCl pre/post medica...    Complete by: As directed    Instructions: May follow [place] Dosing Protocol   Instructions: May administer Cathflo as needed to maintain patency of vascular access device.   Instructions: [place]ushing of vascular access device: per [facility] Protocol: 0.9% NaCl pre/post medication administration and prn patency; Heparin 100 u/ml, 1m for implanted ports and Heparin 10u/ml, 564mfor all other central venous catheters.   Instructions: May follow [facility] Anaphylaxis Protocol for First Dose Administration in the home: 0.9% NaCl at 25-50 ml/hr to maintain IV access for protocol meds. Epinephrine 0.3 ml IV/IM PRN and Benadryl 25-50 IV/IM PRN s/s of anaphylaxis.   Instructions: AdKittsonnfusion Coordinator (RN) to assist per patient IV care needs in the home PRN.   Increase activity slowly   Complete by: As directed      Allergies as of 01/02/2019   No Known Allergies     Medication List    TAKE these medications   Accu-Chek Compact Plus test strip Generic drug: glucose blood Use 4 (four) times daily Use as instructed.   Accu-Chek Guide test strip Generic drug: glucose blood USE TO TEST QID UTD   Accu-Chek FastClix Lancets Misc U UTD TO CHECK BLOOD SUGAR TID   amLODipine 10 MG tablet Commonly known as: NORVASC Take 10 mg by mouth daily.   cefTRIAXone  IVPB Commonly known as: ROCEPHIN Inject 2 g into the vein daily for 11 days. Indication:  Septic arthritis Last Day of Therapy:  01/13/19 Labs - Once weekly:  CBC/D and BMP, Labs - Every other week:  ESR and CRP   cyclobenzaprine 5 MG tablet Commonly known as: FLEXERIL Take 1 tablet (5 mg total) by mouth 3 (three) times daily as needed for muscle spasms.   hydrALAZINE 50 MG tablet Commonly known as: APRESOLINE Take 1 tablet (50 mg total) by mouth 3 (three) times daily for 30 days.   hydrochlorothiazide 25 MG tablet Commonly known as: HYDRODIURIL Take 25 mg by mouth daily.   HYDROcodone-acetaminophen 5-325 MG tablet Commonly  known as: NORCO/VICODIN Take 1 tablet by mouth every 4 (four) hours as needed for moderate pain.   insulin glargine 100 UNIT/ML injection Commonly known as: LANTUS Inject 32 Units into the skin daily.   lisinopril 40 MG tablet Commonly known as: ZESTRIL Take 40 mg by mouth daily.   metoprolol tartrate 100 MG tablet Commonly known as: LOPRESSOR Take 1 tablet (100 mg total) by mouth 2 (two) times daily.   NovoLOG FlexPen 100 UNIT/ML FlexPen Generic drug: insulin aspart Inject 2-15 Units into the skin 4 (four) times daily. Sliding scale 121-150 2 units; 151-200 3 units; 201-250 5 units; 251-300 8 units; 301-350 11  units; 351-400 15 units, greater than 400 - 15 units, call doctor.   omeprazole 40 MG capsule Commonly known as: PRILOSEC Take 1 capsule (40 mg total) by mouth 2 (two) times daily.   rivaroxaban 20 MG Tabs tablet Commonly known as: XARELTO Take 20 mg by mouth daily with supper.   vancomycin  IVPB Inject 1,000 mg into the vein every 12 (twelve) hours for 11 days. Indication:  Septic arthritis Last Day of Therapy:  01/13/19 Labs - _0 /19/20 1557           Discharge Care Instructions  (From admission, onward)         Start     Ordered   01/02/19 0000  Discharge wound care:    Comments: Wound care to right lateral LE ulcer, full thickness:  Cleanse with NS, pat dry. Cover with xeroform gauze Kellie Simmering # 294). Top with dry gauze 4x4s, wrap from toe to knee with Kerlix roll gauze and top with ACE bandage wrapped in Nollie Shiflett similar manner. Change twice daily.   01/02/19 1856         No Known Allergies Follow-up Information    Avanell Shackleton III, MD. Schedule an appointment as soon as possible for Heinz Eckert visit in 10 day(s).   Contact information: [address] [place] [address] [phone]        Tracie Harrier, MD Follow up.   Specialty: Internal Medicine Contact information: [address] [place] [address] [phone]        Michel Bickers, MD Follow up.   Specialty: Infectious Diseases Why: Follow up with Dr. Megan Salon as scheduled Contact information: [address] [place] [address] [phone]            The results of significant diagnostics from this hospitalization (including imaging, microbiology, ancillary and laboratory) are listed below for reference.    Significant Diagnostic Studies: Dg Elbow Complete Right  Result Date: 12/30/2018 CLINICAL DATA:  Right elbow pain EXAM: RIGHT ELBOW - COMPLETE 3+ VIEW COMPARISON:  None. FINDINGS: There is no acute displaced fracture or dislocation. There is some mild  soft tissue swelling  about the posterior aspect of the elbow. There is Vincent Ehrler small joint effusion. There is no radiopaque foreign body. IMPRESSION: 1. No acute displaced fracture or dislocation. 2. Soft tissue swelling about the elbow. 3. Small joint effusion. Electronically Signed   By: Constance Holster M.D.   On: 12/30/2018 19:33   [place] Ekg Site Rite  Result Date: 01/01/2019 If Site Rite image not attached, placement could not be confirmed due to current cardiac rhythm.   Microbiology: Recent Results (from the past 240 hour(s))  Body fluid culture     Status: None   Collection Time: 12/30/18  7:15 PM   Specimen: Synovium; Body Fluid  Result Value Ref Range Status   Specimen Description   Final    SYNOVIAL Performed at [facility] [address] [place], [place] [address]    Special Requests   Final    SYRINGE Performed at [facility], [place] [address], [place], [place] [address]    Gram Stain   Final    ABUNDANT WBC PRESENT, PREDOMINANTLY PMN NO ORGANISMS SEEN Gram Stain Report Called to,Read Back By and Verified With: RN [facility] AT 2147 12/30/18 CRUICKSHANK Argie Applegate Performed at [facility], [place] [address], [place], [place] [address]    Culture   Final    NO GROWTH Performed at [facility], [place] [address] [place], [place] [address]    Report Status 01/02/2019 FINAL  Final  SARS Coronavirus 2 (CEPHEID - Performed in [place] hospital lab), Hosp Order     Status: None   Collection Time: 12/30/18  9:39 PM   Specimen: Nasopharyngeal Swab  Result Value Ref Range Status   SARS Coronavirus 2 NEGATIVE NEGATIVE Final    Comment: (NOTE) If result is NEGATIVE SARS-CoV-2 target nucleic acids are NOT DETECTED. The SARS-CoV-2 RNA is generally detectable in upper and lower  respiratory specimens during the acute phase of infection. The lowest  concentration of SARS-CoV-2 viral copies this assay can detect is 250  copies / mL. Kamden Reber  negative result does not preclude SARS-CoV-2 infection  and should not be used as the sole basis for treatment or other  patient management decisions.  Mikal Blasdell negative result may occur with  improper specimen collection / handling, submission of specimen other  than nasopharyngeal swab, presence of viral mutation(s) within the  areas targeted by this assay, and inadequate number of viral copies  (<250 copies / mL). Irfan Veal negative result must be combined with clinical  observations, patient history, and epidemiological information. If result is POSITIVE SARS-CoV-2 target nucleic acids are DETECTED. The SARS-CoV-2 RNA is generally detectable in upper and lower  respiratory specimens dur ing the acute phase of infection.  Positive  results are indicative of active infection with SARS-CoV-2.  Clinical  correlation with patient history and other diagnostic information is  necessary to determine patient infection status.  Positive results do  not rule out bacterial infection or co-infection with other viruses. If result is PRESUMPTIVE POSTIVE SARS-CoV-2 nucleic acids MAY BE PRESENT.   Avri Paiva presumptive positive result was obtained on the submitted specimen  and confirmed on repeat testing.  While 2019 novel coronavirus  (SARS-CoV-2) nucleic acids may be present in the submitted sample  additional confirmatory testing may be necessary for epidemiological  and / or clinical management purposes  to differentiate between  SARS-CoV-2 and other Sarbecovirus currently known to infect humans.  If clinically indicated additional testing with an alternate test  methodology [phone])  is advised. The SARS-CoV-2 RNA is generally  detectable in upper and lower respiratory sp ecimens during the acute  phase of infection. The expected result is Negative. Fact Sheet for Patients:  StrictlyIdeas.no Fact Sheet for Healthcare Providers: BankingDealers.co.za This test is not  yet approved or cleared by the [place] FDA and has been authorized for detection and/or diagnosis of SARS-CoV-2 by FDA under an Emergency Use Authorization (EUA).  This EUA will remain in effect (meaning this test can be used) for the duration of the COVID-19 declaration under Section 564(b)(1) of the Act, 21 U.S.C. section 360bbb-3(b)(1), unless the authorization is terminated or revoked sooner. Performed at [facility], [place] [address] [place], [place] [address]   Aerobic/Anaerobic Culture (surgical/deep wound)     Status: None (Preliminary result)   Collection Time: 12/31/18 12:35 AM   Specimen: Abscess  Result Value Ref Range Status   Specimen Description   Final    ABSCESS RIGHT ELBOW Performed at [facility] [address], [place], [place] [address]    Special Requests   Final    NONE Performed at [facility], [place] [address], [place], [place] [address]    Gram Stain   Final    FEW WBC PRESENT, PREDOMINANTLY PMN NO ORGANISMS SEEN    Culture   Final    NO GROWTH 2 DAYS NO ANAEROBES ISOLATED; CULTURE IN PROGRESS FOR 5 DAYS Performed at [facility] [address], [place],  [address]    Report Status PENDING  Incomplete     Labs: Basic Metabolic Panel: Recent Labs  Lab 12/30/18 1838 12/31/18 0422 01/01/19 0349 01/02/19 0634  NA 138 139 137 137  K 4.2 3.9 3.5 3.6  CL 104 102 105 102  CO2 21* _0 GLUCOSE 162* 167* 144* 127*  BUN _1 21*  CREATININE 1.32* 1.35* 1.36* 1.46*  1.44*  CALCIUM 9.3 9.0 8.6* 8.4*  MG  --   --  2.0 2.0   Liver Function Tests: Recent Labs  Lab 01/01/19 0349 01/02/19 0634  AST 16 15  ALT 13 12  ALKPHOS 72 63  BILITOT 0.8 0.6  PROT 7.8 7.1  ALBUMIN 3.4* 3.0*   No results for input(s): LIPASE, AMYLASE in the last 168 hours. No results for input(s): AMMONIA in the last 168 hours. CBC: Recent Labs  Lab 12/30/18 1838 12/31/18 0422  01/01/19 0349 01/02/19 0634  WBC 8.7 8.1 6.5 5.7  NEUTROABS 6.5 5.9  --   --   HGB 16.1 15.3 15.0 13.5  HCT 49.6 48.5 48.1 41.2  MCV 91.7 93.6 92.3 90.5  PLT 258 193 225 233   Cardiac Enzymes: No results for input(s): CKTOTAL, CKMB, CKMBINDEX, TROPONINI in the last 168 hours. BNP: BNP (last 3 results) No results for input(s): BNP in the last 8760 hours.  ProBNP (last 3 results) No results for input(s): PROBNP in the last 8760 hours.  CBG: Recent Labs  Lab 01/01/19 1638 01/01/19 2111 01/02/19 0734 01/02/19 1121 01/02/19 1635  GLUCAP 131* 132* 116* 121* 133*       Signed:  Fayrene Helper MD.  [facility] 01/02/2019, 7:08 PM

## 2019-01-02 NOTE — Progress Notes (Signed)
Patient ID: [name], male   DOB: November 08, 1970, 48 y.o.   MRN: [id number]         [facility] for Infectious Disease  Date of Admission:  12/30/2018           Day 3 vancomycin        Day 2 ceftriaxone ASSESSMENT: His synovial fluid cultures remain negative.  I recommend discharge home on his current empiric antibiotic regimen.  I plan on 2 weeks of IV therapy then consideration of switching to oral therapy for a few more weeks.  PLAN: 1. Continue vancomycin and ceftriaxone pending  2. I will check final cultures and make any adjustments necessary 3. He will follow-up with me on 01/12/2019  Diagnosis: Right elbow septic arthritis  Culture Result: Pending  No Known Allergies  OPAT Orders Discharge antibiotics: Per pharmacy protocol vancomycin and ceftriaxone Aim for Vancomycin trough 15-20 (unless otherwise indicated) Duration: 2 weeks End Date: 01/13/2019  [facility] Care Per Protocol:  Labs weekly while on IV antibiotics: _x_ CBC with differential _x_ BMP __ CMP _x_ CRP _x_ ESR _x_ Vancomycin trough __ CK  _x_ Please pull PIC at completion of IV antibiotics __ Please leave PIC in place until doctor has seen patient or been notified  Fax weekly labs to [phone]  Clinic Follow Up Appt: 01/12/2019   Principal Problem:   Septic arthritis of elbow, right ([place]) Active Problems:   Essential hypertension   Chronic anticoagulation   Type 2 diabetes mellitus with stage 2 chronic kidney disease, with long-term current use of insulin ([place])   History of DVT (deep vein thrombosis)   Chronic venous insufficiency   Venous stasis ulcer (HCC)   Chronic kidney disease (CKD) stage G2/A2, mildly decreased glomerular filtration rate (GFR) between 60-89 mL/min/1.73 square meter and albuminuria creatinine ratio between 30-299 mg/g   Scheduled Meds: . amLODipine  10 mg Oral Daily  . hydrALAZINE  50 mg Oral TID  . insulin aspart  0-5 Units Subcutaneous QHS  . insulin  aspart  0-9 Units Subcutaneous TID WC  . insulin glargine  15 Units Subcutaneous QHS  . metoprolol tartrate  100 mg Oral BID  . pantoprazole  40 mg Oral BID  . sodium chloride flush  3 mL Intravenous Q12H   Continuous Infusions: . sodium chloride    . cefTRIAXone (ROCEPHIN)  IV 2 g (01/01/19 1458)  . vancomycin 1,000 mg (01/02/19 0359)   PRN Meds:.sodium chloride, acetaminophen **OR** acetaminophen, cyclobenzaprine, HYDROcodone-acetaminophen, ondansetron **OR** ondansetron (ZOFRAN) IV, senna-docusate, sodium chloride flush, sodium chloride flush   SUBJECTIVE: He is feeling much better.  Review of Systems: Review of Systems  Constitutional: Negative for chills, diaphoresis and fever.  Musculoskeletal: Positive for joint pain.    No Known Allergies  OBJECTIVE: Vitals:   01/01/19 2109 01/01/19 2132 01/01/19 2249 01/02/19 0539  BP: (!) 171/110 (!) 196/91 (!) 173/82 (!) 184/92  Pulse: 76   67  Resp: 18   18  Temp: 98.7 F (37.1 C)   98.5 F (36.9 C)  TempSrc: Oral   Oral  SpO2: 96%   96%  Weight:      Height:       Body mass index is 35.57 kg/m.  Physical Exam Constitutional:      Comments: He is sitting up in a chair.  He is in good spirits.  Psychiatric:        Mood and Affect: Mood normal.     Lab Results Lab Results  Component  Value Date   WBC 5.7 01/02/2019   HGB 13.5 01/02/2019   HCT 41.2 01/02/2019   MCV 90.5 01/02/2019   PLT 233 01/02/2019    Lab Results  Component Value Date   CREATININE 1.46 (H) 01/02/2019   CREATININE 1.44 (H) 01/02/2019   BUN 21 (H) 01/02/2019   NA 137 01/02/2019   K 3.6 01/02/2019   CL 102 01/02/2019   CO2 25 01/02/2019    Lab Results  Component Value Date   ALT 12 01/02/2019   AST 15 01/02/2019   ALKPHOS 63 01/02/2019   BILITOT 0.6 01/02/2019     Microbiology: Recent Results (from the past 240 hour(s))  Body fluid culture     Status: None   Collection Time: 12/30/18  7:15 PM   Specimen: Synovium; Body Fluid   Result Value Ref Range Status   Specimen Description   Final    SYNOVIAL Performed at [facility] [address], [place], [place] [address]    Special Requests   Final    SYRINGE Performed at [facility], [place] [address] [place], [place] [address]    Gram Stain   Final    ABUNDANT WBC PRESENT, PREDOMINANTLY PMN NO ORGANISMS SEEN Gram Stain Report Called to,Read Back By and Verified With: RN [facility] AT 2147 12/30/18 CRUICKSHANK A Performed at [facility], [place] [address], [place], [place] [address]    Culture   Final    NO GROWTH Performed at [facility], [place] [address] [place], [place] [address]    Report Status 01/02/2019 FINAL  Final  SARS Coronavirus 2 (CEPHEID - Performed in [place] hospital lab), Hosp Order     Status: None   Collection Time: 12/30/18  9:39 PM   Specimen: Nasopharyngeal Swab  Result Value Ref Range Status   SARS Coronavirus 2 NEGATIVE NEGATIVE Final    Comment: (NOTE) If result is NEGATIVE SARS-CoV-2 target nucleic acids are NOT DETECTED. The SARS-CoV-2 RNA is generally detectable in upper and lower  respiratory specimens during the acute phase of infection. The lowest  concentration of SARS-CoV-2 viral copies this assay can detect is 250  copies / mL. A negative result does not preclude SARS-CoV-2 infection  and should not be used as the sole basis for treatment or other  patient management decisions.  A negative result may occur with  improper specimen collection / handling, submission of specimen other  than nasopharyngeal swab, presence of viral mutation(s) within the  areas targeted by this assay, and inadequate number of viral copies  (<250 copies / mL). A negative result must be combined with clinical  observations, patient history, and epidemiological information. If result is POSITIVE SARS-CoV-2 target nucleic acids are DETECTED. The SARS-CoV-2 RNA is generally detectable in upper  and lower  respiratory specimens dur ing the acute phase of infection.  Positive  results are indicative of active infection with SARS-CoV-2.  Clinical  correlation with patient history and other diagnostic information is  necessary to determine patient infection status.  Positive results do  not rule out bacterial infection or co-infection with other viruses. If result is PRESUMPTIVE POSTIVE SARS-CoV-2 nucleic acids MAY BE PRESENT.   A presumptive positive result was obtained on the submitted specimen  and confirmed on repeat testing.  While 2019 novel coronavirus  (SARS-CoV-2) nucleic acids may be present in the submitted sample  additional confirmatory testing may be necessary for epidemiological  and / or clinical management  purposes  to differentiate between  SARS-CoV-2 and other Sarbecovirus currently known to infect humans.  If clinically indicated additional testing with an alternate test  methodology [phone]) is advised. The SARS-CoV-2 RNA is generally  detectable in upper and lower respiratory sp ecimens during the acute  phase of infection. The expected result is Negative. Fact Sheet for Patients:  StrictlyIdeas.no Fact Sheet for Healthcare Providers: BankingDealers.co.za This test is not yet approved or cleared by the [place] FDA and has been authorized for detection and/or diagnosis of SARS-CoV-2 by FDA under an Emergency Use Authorization (EUA).  This EUA will remain in effect (meaning this test can be used) for the duration of the COVID-19 declaration under Section 564(b)(1) of the Act, 21 U.S.C. section 360bbb-3(b)(1), unless the authorization is terminated or revoked sooner. Performed at [facility], [place] [address] [place], [place] [address]   Aerobic/Anaerobic Culture (surgical/deep wound)     Status: None (Preliminary result)   Collection Time: 12/31/18 12:35 AM   Specimen: Abscess   Result Value Ref Range Status   Specimen Description   Final    ABSCESS RIGHT ELBOW Performed at [facility] [address], [place], [place] [address]    Special Requests   Final    NONE Performed at [facility], [place] [address] [place], [place] [address]    Gram Stain   Final    FEW WBC PRESENT, PREDOMINANTLY PMN NO ORGANISMS SEEN    Culture   Final    NO GROWTH 1 DAY Performed at [place] [address], [place], [place] [address]    Report Status PENDING  Incomplete    Michel Bickers, MD [facility] for Infectious [place] [facility] [phone] pager   [phone] cell 01/02/2019, 10:31 AM

## 2019-01-02 NOTE — Progress Notes (Signed)
PHARMACY CONSULT NOTE FOR:  OUTPATIENT  PARENTERAL ANTIBIOTIC THERAPY (OPAT)  Indication: Septic arthritis Regimen: Ceftriaxone 2 g IV q24h; vancomycin 1000 mg IV q12h End date: 01/13/19  IV antibiotic discharge orders are pended. To discharging provider:  please sign these orders via discharge navigator,  Select New Orders & click on the button choice - Manage This Unsigned Work.     Thank you for allowing pharmacy to be a part of this patient's care.  Lenis Noon, PharmD 01/02/2019, 3:46 PM

## 2019-01-02 NOTE — Progress Notes (Signed)
Discharge instructions given patient. Patient has no questions. Once patient's ride gets here. NT or writer will take patient outside to the front entrance

## 2019-01-02 NOTE — Progress Notes (Signed)
   Ortho Hand Progress Note  Subjective: No acute events last night. States elbow feels "a whole lot better".   Objective: Vital signs in last 24 hours: Temp:  [98.5 F (36.9 C)-98.7 F (37.1 C)] 98.5 F (36.9 C) (06/19 0539) Pulse Rate:  [67-76] 67 (06/19 0539) Resp:  [18] 18 (06/19 0539) BP: (171-196)/(82-110) 184/92 (06/19 0539) SpO2:  [96 %] 96 % (06/19 0539)  Intake/Output from previous day: 06/18 0701 - 06/19 0700 In: 1620 [P.O.:1020; IV Piggyback:600] Out: 500 [Urine:500] Intake/Output this shift: No intake/output data recorded.  Recent Labs    12/30/18 1838 12/31/18 0422 01/01/19 0349 01/02/19 0634  HGB 16.1 15.3 15.0 13.5   Recent Labs    01/01/19 0349 01/02/19 0634  WBC 6.5 5.7  RBC 5.21 4.55  HCT 48.1 41.2  PLT 225 233   Recent Labs    01/01/19 0349 01/02/19 0634  NA 137 137  K 3.5 3.6  CL 105 102  CO2 23 25  BUN 18 21*  CREATININE 1.36* 1.46*  1.44*  GLUCOSE 144* 127*  CALCIUM 8.6* 8.4*   No results for input(s): LABPT, INR in the last 72 hours.  aaox3 nad resp nonlabored rrr RUE: Surgical dressings changed. Incision c/d/i. Drain with serosang output which was pulled. Improved elbow ROM from 30-90 but with pain. Motor intact ain/pin/u. SILT m/u/r. Fingers wwp with bcr.  Assessment/Plan: 48 yo M with right elbow septic arthritis, POD2 from Right elbow I&D  - Hospitalist primary. Appreciate management - Continue IV vanco. Appreciate ID input - Cxs NGTD - Drain pulled - OK for all ROM as tolerated. Encouraged him to work on regular elbow flexion and extension now that the drain is out - Elevate RUE for pain and swelling - Remainder per primary  - OK for d/c from my standpoint once abx plan has been determined. F/u with me in 10-14 days post op   Avanell Shackleton III 01/02/2019, 7:22 AM  [phone]

## 2019-01-05 LAB — AEROBIC/ANAEROBIC CULTURE W GRAM STAIN (SURGICAL/DEEP WOUND): Culture: NO GROWTH

## 2019-01-09 ENCOUNTER — Encounter: Payer: Self-pay | Admitting: Internal Medicine

## 2019-01-12 ENCOUNTER — Ambulatory Visit (INDEPENDENT_AMBULATORY_CARE_PROVIDER_SITE_OTHER): Payer: Self-pay | Admitting: Internal Medicine

## 2019-01-12 ENCOUNTER — Other Ambulatory Visit: Payer: Self-pay

## 2019-01-12 ENCOUNTER — Encounter: Payer: Self-pay | Admitting: Internal Medicine

## 2019-01-12 ENCOUNTER — Telehealth: Payer: Self-pay

## 2019-01-12 DIAGNOSIS — M009 Pyogenic arthritis, unspecified: Secondary | ICD-10-CM

## 2019-01-12 MED ORDER — VANCOMYCIN IV (FOR PTA / DISCHARGE USE ONLY): 1000.0000 mg | Freq: Two times a day (BID) | INTRAVENOUS | Status: AC

## 2019-01-12 MED ORDER — CEFTRIAXONE IV (FOR PTA / DISCHARGE USE ONLY): 2.0000 g | INTRAVENOUS | Status: AC

## 2019-01-12 MED ORDER — CEFUROXIME AXETIL 250 MG PO TABS: 250.0000 mg | ORAL_TABLET | Freq: Two times a day (BID) | ORAL | 0 refills | Status: DC

## 2019-01-12 MED ORDER — DOXYCYCLINE HYCLATE 100 MG PO TABS: 100.0000 mg | ORAL_TABLET | Freq: Two times a day (BID) | ORAL | 0 refills | Status: DC

## 2019-01-12 NOTE — Assessment & Plan Note (Signed)
He is improving on empiric antibiotic therapy for culture-negative septic arthritis of his right elbow.  He will complete IV antibiotic therapy tomorrow and have his pick pulled.  I will complete therapy with 2 weeks of oral doxycycline and cefuroxime.  He will follow-up here in 1 month.

## 2019-01-12 NOTE — Telephone Encounter (Signed)
Per Dr. Megan Salon called [facility] with verbal order to stop patient Iv antibiotics and pull picc on 6/30. Spoke with Jackelyn Poling who was able to take verbal order. Order was repeated and confirmed before calling was ended. [place]

## 2019-01-12 NOTE — Progress Notes (Signed)
[place] for Infectious Disease  Patient Active Problem List   Diagnosis Date Noted  . Septic arthritis of elbow, right ([place]) 12/30/2018    Priority: High  . Venous stasis ulcer ([place]) 12/31/2018  . Chronic kidney disease (CKD) stage G2/A2, mildly decreased glomerular filtration rate (GFR) between 60-89 mL/min/1.73 square meter and albuminuria creatinine ratio between 30-299 mg/g 12/31/2018  . Chronic venous insufficiency 02/06/2018  . Chronic anticoagulation 03/31/2017  . Type 2 diabetes mellitus with stage 2 chronic kidney disease, with long-term current use of insulin ([place]) 03/27/2017  . History of DVT (deep vein thrombosis) 03/27/2017  . Essential hypertension 01/28/2008    Patient's Medications  New Prescriptions   CEFUROXIME (CEFTIN) 250 MG TABLET    Take 1 tablet (250 mg total) by mouth 2 (two) times daily with a meal.   DOXYCYCLINE (VIBRA-TABS) 100 MG TABLET    Take 1 tablet (100 mg total) by mouth 2 (two) times daily.  Previous Medications   ACCU-CHEK FASTCLIX LANCETS MISC    U UTD TO CHECK BLOOD SUGAR TID   ACCU-CHEK GUIDE TEST STRIP    USE TO TEST QID UTD   AMLODIPINE (NORVASC) 10 MG TABLET    Take 10 mg by mouth daily.   CYCLOBENZAPRINE (FLEXERIL) 5 MG TABLET    Take 1 tablet (5 mg total) by mouth 3 (three) times daily as needed for muscle spasms.   GLUCOSE BLOOD (ACCU-CHEK COMPACT PLUS) TEST STRIP    Use 4 (four) times daily Use as instructed.   HYDRALAZINE (APRESOLINE) 50 MG TABLET    Take 1 tablet (50 mg total) by mouth 3 (three) times daily for 30 days.   HYDROCHLOROTHIAZIDE (HYDRODIURIL) 25 MG TABLET    Take 25 mg by mouth daily.    HYDROCODONE-ACETAMINOPHEN (NORCO/VICODIN) 5-325 MG TABLET    Take 1 tablet by mouth every 4 (four) hours as needed for moderate pain.    INSULIN ASPART (NOVOLOG FLEXPEN) 100 UNIT/ML FLEXPEN    Inject 2-15 Units into the skin 4 (four) times daily. Sliding scale 121-150 2 units; 151-200 3 units; 201-250 5 units; 251-300 8  units; 301-350 11 units; 351-400 15 units, greater than 400 - 15 units, call doctor.   INSULIN GLARGINE (LANTUS) 100 UNIT/ML INJECTION    Inject 32 Units into the skin daily.   LISINOPRIL (PRINIVIL,ZESTRIL) 40 MG TABLET    Take 40 mg by mouth daily.    METOPROLOL TARTRATE (LOPRESSOR) 100 MG TABLET    Take 1 tablet (100 mg total) by mouth 2 (two) times daily.   OMEPRAZOLE (PRILOSEC) 40 MG CAPSULE    Take 1 capsule (40 mg total) by mouth 2 (two) times daily.   RIVAROXABAN (XARELTO) 20 MG TABS TABLET    Take 20 mg by mouth daily with supper.  Modified Medications   Modified Medication Previous Medication   CEFTRIAXONE (ROCEPHIN) IVPB cefTRIAXone (ROCEPHIN) IVPB      Inject 2 g into the vein daily for 1 day. Indication:  Septic arthritis Last Day of Therapy:  01/13/19 Labs - Once weekly:  CBC/D and BMP, Labs - Every other week:  ESR and CRP    Inject 2 g into the vein daily for 11 days. Indication:  Septic arthritis Last Day of Therapy:  01/13/19 Labs - Once weekly:  CBC/D and BMP, Labs - Every other week:  ESR and CRP   VANCOMYCIN IVPB vancomycin IVPB      Inject 1,000 mg into the vein every  12 (twelve) hours for 1 day. Indication:  Septic arthritis Last Day of Therapy:  01/13/19 Labs - Sunday/Monday:  CBC/D, BMP, and vancomycin trough. Labs - Thursday:  BMP and vancomycin trough Labs - Every other week:  ESR and CRP    Inject 1,000 mg into the vein every 12 (twelve) hours for 11 days. Indication:  Septic arthritis Last Day of Therapy:  01/13/19 Labs - Sunday/Monday:  CBC/D, BMP, and vancomycin trough. Labs - Thursday:  BMP and vancomycin trough Labs - Every other week:  ESR and CRP  Discontinued Medications   No medications on file    Subjective: [name] is in for his hospital follow-up visit.  He developed spontaneous right elbow septic arthritis.  He underwent incision and drainage.  Aspirate and operative Gram stain and cultures were negative.  He was discharged on empiric  vancomycin and ceftriaxone.  He is now completed 13 days of total IV antibiotic therapy.  He has had no problems tolerating his antibiotics or PICC.  He has some soreness in his elbow but overall his pain continues to decrease.  He is doing passive range of motion exercises at home.  Review of Systems: Review of Systems  Constitutional: Negative for chills, diaphoresis and fever.  Gastrointestinal: Negative for diarrhea, nausea and vomiting.  Musculoskeletal: Positive for joint pain.    Past Medical History:  Diagnosis Date  . Acute blood loss as cause of postoperative anemia 04/18/2017  . ARF (acute renal failure) ([place])   . Cellulitis of right lower leg 03/31/2017  . Chronic anticoagulation 03/31/2017  . Diabetes mellitus without complication ([place])   . Diabetic ulcer of calf ([place]) 03/31/2017  . DVT of proximal lower limb ([place]) 03/20/2017   R calf area  . Escherichia coli sepsis ([place]) 04/18/2017  . Gout   . History of hiatal hernia   . Hypertension   . Hypoalbuminemia 03/31/2017  . Necrotizing fasciitis ([place])   . Right leg DVT ([place]) 03/31/2017  . Sepsis ([place]) 03/30/2017  . Sinus tachycardia 03/31/2017  . Type 2 diabetes mellitus with stage 2 chronic kidney disease, with long-term current use of insulin ([place]) 03/27/2017    Social History   Tobacco Use  . Smoking status: Never Smoker  . Smokeless tobacco: Never Used  Substance Use Topics  . Alcohol use: No  . Drug use: No    Family History  Problem Relation Age of Onset  . Hypertension Mother   . Stroke Mother   . Diabetes Father   . Renal Disease Father   . Hypertension Sister   . Diabetes Sister   . Renal Disease Sister   . Iron deficiency Sister     No Known Allergies  Objective: Vitals:   01/12/19 0929  BP: (!) 146/96  Pulse: 84  Temp: 98.3 F (36.8 C)  TempSrc: Oral  Weight: 260 lb (117.9 kg)   Body mass index is 36.26 kg/m.  Physical Exam Constitutional:      Comments: He is in good spirits.   Musculoskeletal:     Comments: Surgical incision on the lateral aspect of his right elbow looks good.  His sutures remain in place.  There is no unusual redness, swelling or warmth.  Skin:    Comments: Left arm PIC site looks good.  Psychiatric:        Mood and Affect: Mood normal.     Lab Results    Problem List Items Addressed This Visit      High   Septic  arthritis of elbow, right ([place])    He is improving on empiric antibiotic therapy for culture-negative septic arthritis of his right elbow.  He will complete IV antibiotic therapy tomorrow and have his pick pulled.  I will complete therapy with 2 weeks of oral doxycycline and cefuroxime.  He will follow-up here in 1 month.      Relevant Medications   cefTRIAXone (ROCEPHIN) IVPB   vancomycin IVPB   doxycycline (VIBRA-TABS) 100 MG tablet   cefUROXime (CEFTIN) 250 MG tablet       [name] Bickers, MD [facility] for Infectious [place] [facility] [phone] pager   [phone] cell 01/12/2019, 9:56 AM

## 2019-01-13 DIAGNOSIS — M25521 Pain in right elbow: Secondary | ICD-10-CM | POA: Insufficient documentation

## 2019-01-14 ENCOUNTER — Other Ambulatory Visit: Payer: Self-pay | Admitting: *Deleted

## 2019-01-14 DIAGNOSIS — M009 Pyogenic arthritis, unspecified: Secondary | ICD-10-CM

## 2019-01-14 MED ORDER — CEFUROXIME AXETIL 250 MG PO TABS: 250.0000 mg | ORAL_TABLET | Freq: Two times a day (BID) | ORAL | 0 refills | Status: DC

## 2019-01-14 MED ORDER — DOXYCYCLINE HYCLATE 100 MG PO TABS: 100.0000 mg | ORAL_TABLET | Freq: Two times a day (BID) | ORAL | 0 refills | Status: DC

## 2019-01-14 MED FILL — CEFUROXIME AXETIL 250 MG TA: 250 | 14 days supply | Qty: 28 | Fill #0

## 2019-01-14 MED FILL — DOXYCYCLINE HYCLATE 100 MG: 100 | 14 days supply | Qty: 28 | Fill #0

## 2019-02-05 ENCOUNTER — Other Ambulatory Visit: Payer: Self-pay

## 2019-02-05 ENCOUNTER — Ambulatory Visit (INDEPENDENT_AMBULATORY_CARE_PROVIDER_SITE_OTHER): Payer: Medicaid Other | Admitting: Internal Medicine

## 2019-02-05 ENCOUNTER — Encounter: Payer: Self-pay | Admitting: Internal Medicine

## 2019-02-05 DIAGNOSIS — M009 Pyogenic arthritis, unspecified: Secondary | ICD-10-CM

## 2019-02-05 NOTE — Progress Notes (Signed)
[place] for Infectious Disease  Patient Active Problem List   Diagnosis Date Noted  . Septic arthritis of elbow, right ([place]) 12/30/2018    Priority: High  . Venous stasis ulcer ([place]) 12/31/2018  . Chronic kidney disease (CKD) stage G2/A2, mildly decreased glomerular filtration rate (GFR) between 60-89 mL/min/1.73 square meter and albuminuria creatinine ratio between 30-299 mg/g 12/31/2018  . Chronic venous insufficiency 02/06/2018  . Chronic anticoagulation 03/31/2017  . Type 2 diabetes mellitus with stage 2 chronic kidney disease, with long-term current use of insulin ([place]) 03/27/2017  . History of DVT (deep vein thrombosis) 03/27/2017  . Essential hypertension 01/28/2008    Patient's Medications  New Prescriptions   No medications on file  Previous Medications   ACCU-CHEK FASTCLIX LANCETS MISC    U UTD TO CHECK BLOOD SUGAR TID   ACCU-CHEK GUIDE TEST STRIP    USE TO TEST QID UTD   AMLODIPINE (NORVASC) 10 MG TABLET    Take 10 mg by mouth daily.   CYCLOBENZAPRINE (FLEXERIL) 5 MG TABLET    Take 1 tablet (5 mg total) by mouth 3 (three) times daily as needed for muscle spasms.   GLUCOSE BLOOD (ACCU-CHEK COMPACT PLUS) TEST STRIP    Use 4 (four) times daily Use as instructed.   HYDRALAZINE (APRESOLINE) 50 MG TABLET    Take 1 tablet (50 mg total) by mouth 3 (three) times daily for 30 days.   HYDROCHLOROTHIAZIDE (HYDRODIURIL) 25 MG TABLET    Take 25 mg by mouth daily.    HYDROCODONE-ACETAMINOPHEN (NORCO/VICODIN) 5-325 MG TABLET    Take 1 tablet by mouth every 4 (four) hours as needed for moderate pain.    INSULIN ASPART (NOVOLOG FLEXPEN) 100 UNIT/ML FLEXPEN    Inject 2-15 Units into the skin 4 (four) times daily. Sliding scale 121-150 2 units; 151-200 3 units; 201-250 5 units; 251-300 8 units; 301-350 11 units; 351-400 15 units, greater than 400 - 15 units, call doctor.   INSULIN GLARGINE (LANTUS) 100 UNIT/ML INJECTION    Inject 32 Units into the skin daily.   LISINOPRIL  (PRINIVIL,ZESTRIL) 40 MG TABLET    Take 40 mg by mouth daily.    METOPROLOL TARTRATE (LOPRESSOR) 100 MG TABLET    Take 1 tablet (100 mg total) by mouth 2 (two) times daily.   OMEPRAZOLE (PRILOSEC) 40 MG CAPSULE    Take 1 capsule (40 mg total) by mouth 2 (two) times daily.   RIVAROXABAN (XARELTO) 20 MG TABS TABLET    Take 20 mg by mouth daily with supper.  Modified Medications   No medications on file  Discontinued Medications   CEFUROXIME (CEFTIN) 250 MG TABLET    Take 1 tablet (250 mg total) by mouth 2 (two) times daily with a meal.   DOXYCYCLINE (VIBRA-TABS) 100 MG TABLET    Take 1 tablet (100 mg total) by mouth 2 (two) times daily.    Subjective: [name] is in for his follow-up visit.  He developed spontaneous right elbow septic arthritis.  He underwent incision and drainage.  Aspirate and operative Gram stain and cultures were negative.  He was discharged on empiric vancomycin and ceftriaxone.  He is now completed 14 days of total IV antibiotic therapy then transitioned to oral doxycycline and cefuroxime for 2 weeks.  He completed antibiotic therapy 8 days ago.  He had no problems tolerating his antibiotics.  He has minimal pain and his surgical incision has healed nicely.  He has excellent  and improving range of motion.  Review of Systems: Review of Systems  Constitutional: Negative for chills, diaphoresis and fever.  Gastrointestinal: Negative for diarrhea, nausea and vomiting.  Musculoskeletal: Positive for joint pain.    Past Medical History:  Diagnosis Date  . Acute blood loss as cause of postoperative anemia 04/18/2017  . ARF (acute renal failure) ([place])   . Cellulitis of right lower leg 03/31/2017  . Chronic anticoagulation 03/31/2017  . Diabetes mellitus without complication ([place])   . Diabetic ulcer of calf ([place]) 03/31/2017  . DVT of proximal lower limb ([place]) 03/20/2017   R calf area  . Escherichia coli sepsis ([place]) 04/18/2017  . Gout   . History of hiatal hernia   .  Hypertension   . Hypoalbuminemia 03/31/2017  . Necrotizing fasciitis ([place])   . Right leg DVT ([place]) 03/31/2017  . Sepsis ([place]) 03/30/2017  . Sinus tachycardia 03/31/2017  . Type 2 diabetes mellitus with stage 2 chronic kidney disease, with long-term current use of insulin ([place]) 03/27/2017    Social History   Tobacco Use  . Smoking status: Never Smoker  . Smokeless tobacco: Never Used  Substance Use Topics  . Alcohol use: No  . Drug use: No    Family History  Problem Relation Age of Onset  . Hypertension Mother   . Stroke Mother   . Diabetes Father   . Renal Disease Father   . Hypertension Sister   . Diabetes Sister   . Renal Disease Sister   . Iron deficiency Sister     No Known Allergies  Objective: Vitals:   02/05/19 0843  BP: 139/84  Pulse: 74  Temp: 98 F (36.7 C)  Weight: 260 lb (117.9 kg)  Height: 5\' 10"  (1.778 m)   Body mass index is 37.31 kg/m.  Physical Exam Constitutional:      Comments: He is in good spirits.  Musculoskeletal:     Comments: Surgical incision on the lateral aspect of his right elbow has healed nicely.  There is no unusual redness, swelling or warmth.  Psychiatric:        Mood and Affect: Mood normal.     Lab Results    Problem List Items Addressed This Visit      High   Septic arthritis of elbow, right ([place])    I am very hopeful that his septic arthritis has now been cured through a combination of surgery and 4 weeks of antibiotic therapy.  He can follow-up here as needed.          Michel Bickers, MD [facility] for Infectious [place] [facility] [phone] pager   [phone] cell 02/05/2019, 8:54 AM

## 2019-02-05 NOTE — Assessment & Plan Note (Signed)
I am very hopeful that his septic arthritis has now been cured through a combination of surgery and 4 weeks of antibiotic therapy.  He can follow-up here as needed.

## 2019-02-08 IMAGING — US US RENAL
1 series · 14 of 25 positions shown · non-contrast
Comparison: None.

CLINICAL DATA: Acute renal failure

EXAM:
RENAL / URINARY TRACT ULTRASOUND COMPLETE

[Series 1: us renal · 0.28mm/px · 14 of 34 slices shown]
[im 1/34]
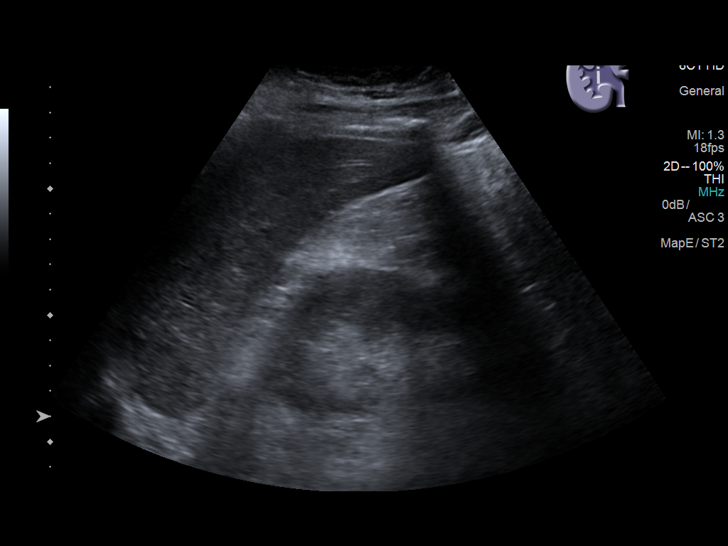
[im 3/34]
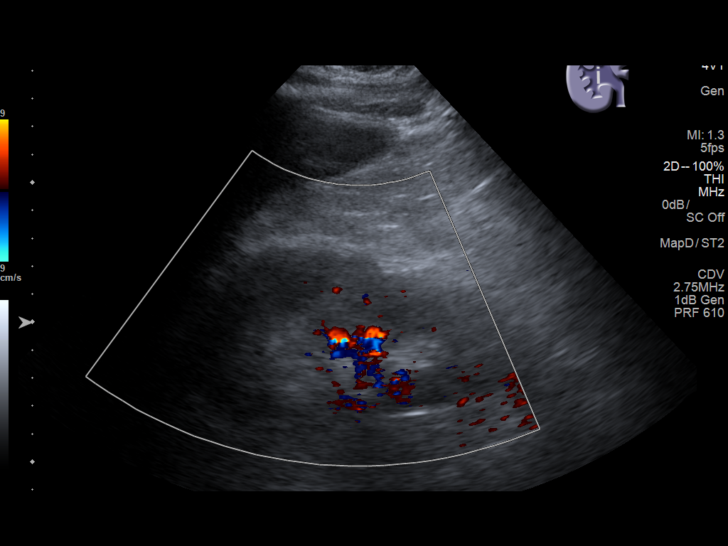
[im 6/34]
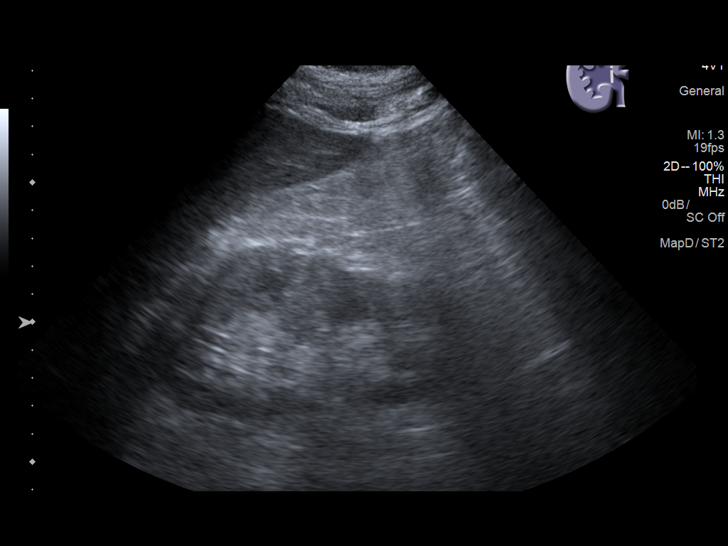
[im 9/34]
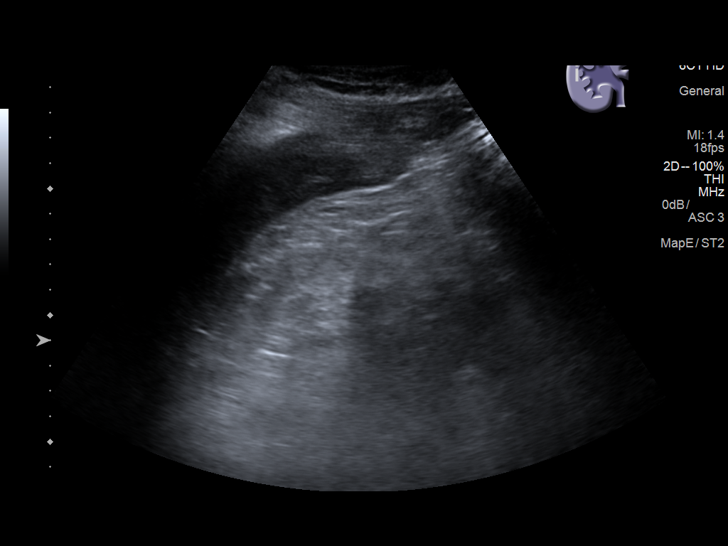
[im 12/34]
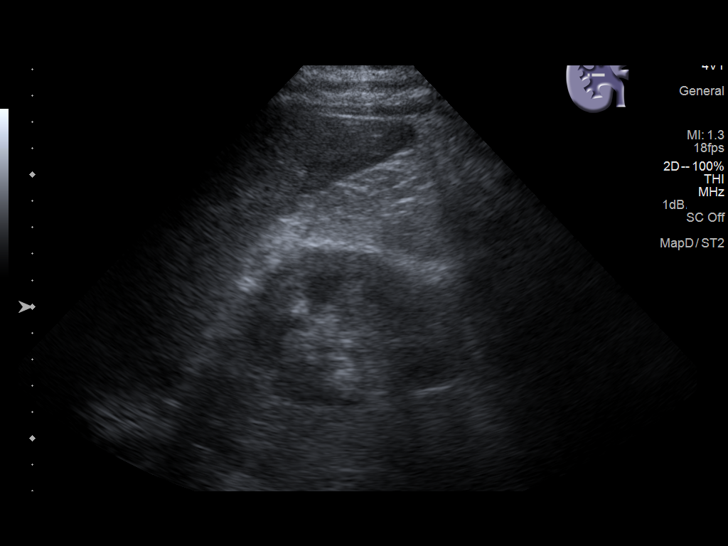
[im 13/34]
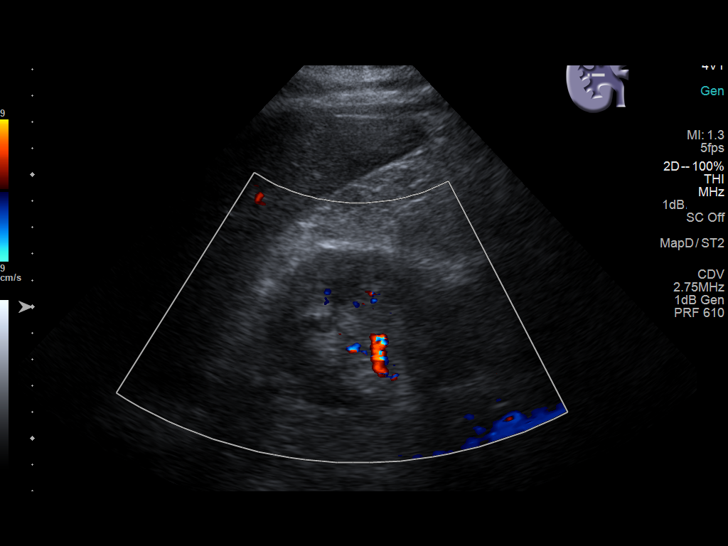
[im 16/34]
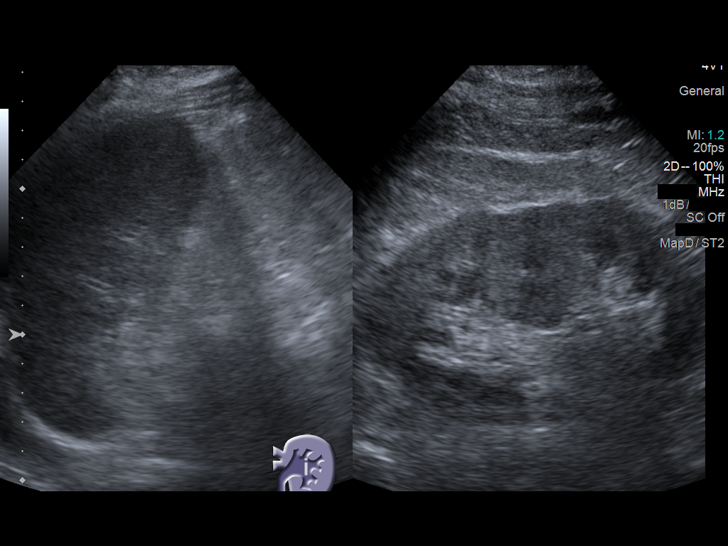
[im 18/34]
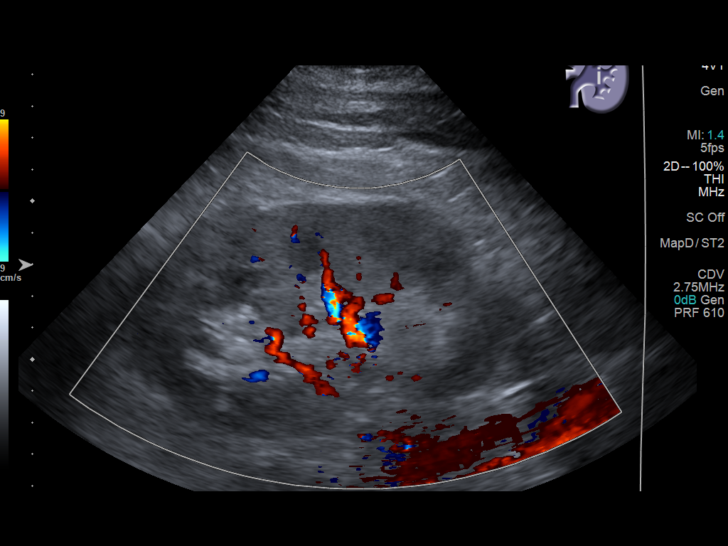
[im 21/34]
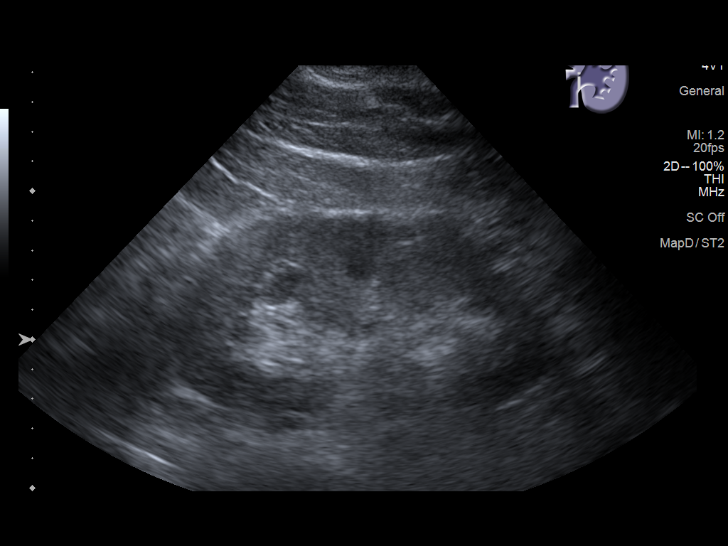
[im 23/34]
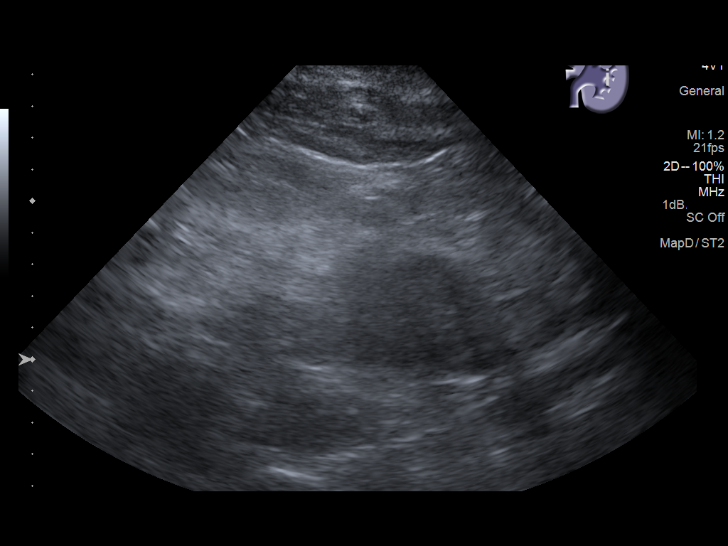
[im 25/34]
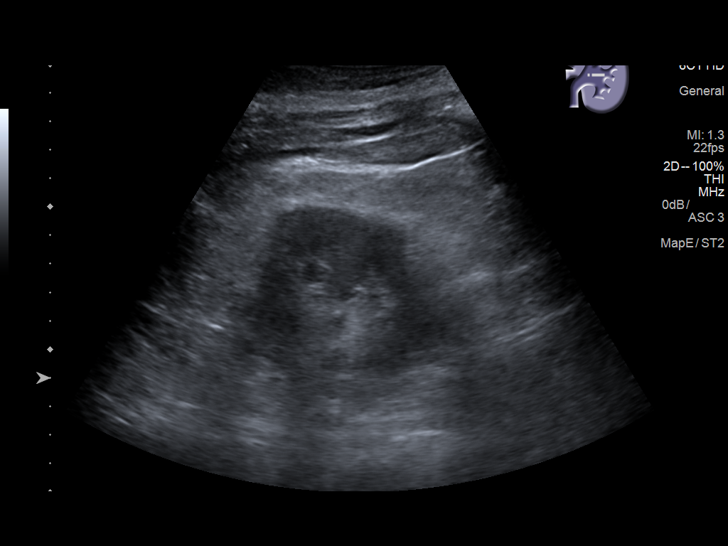
[im 28/34]
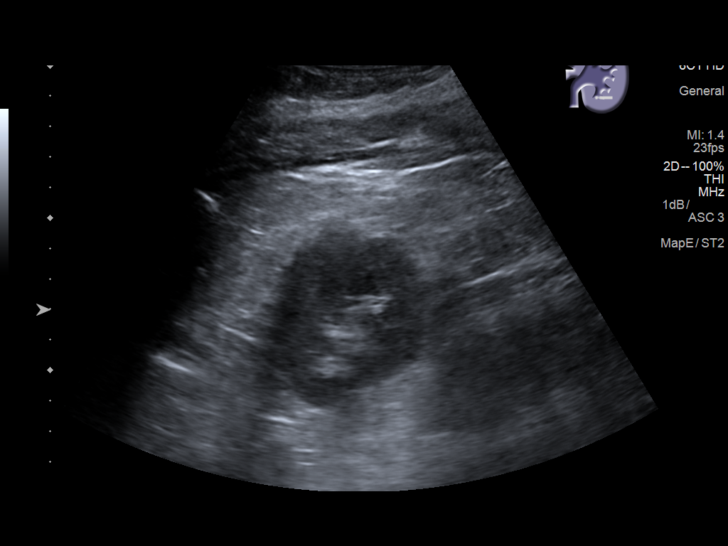
[im 31/34]
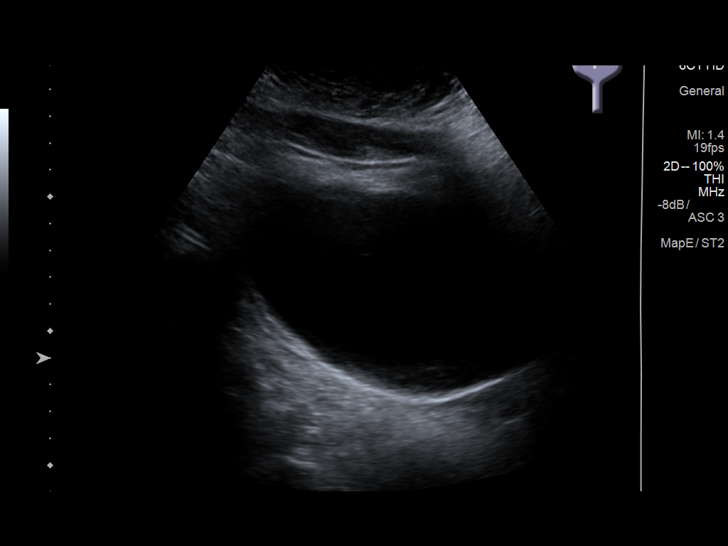
[im 34/34]
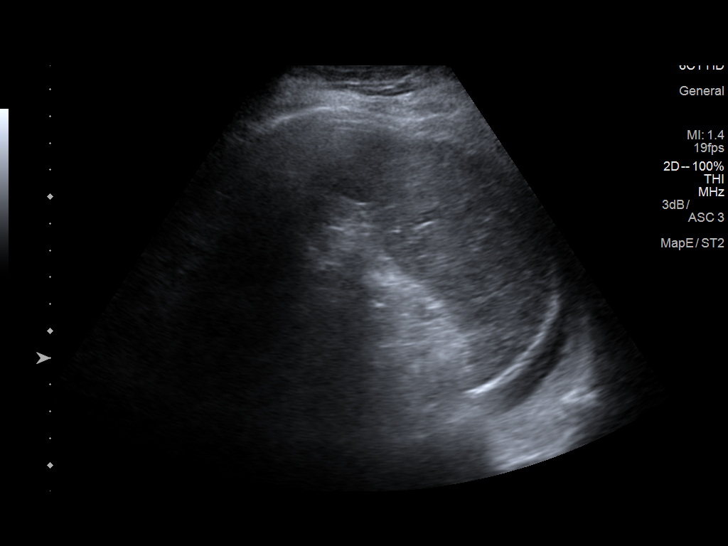

[14 of 25 positions shown; findings below may reference images not displayed]

FINDINGS: Right Kidney:

Length: 11.6 cm. Echogenicity within normal limits. No mass or
hydronephrosis visualized.

Left Kidney:

Length: 13.1 cm. Echogenicity within normal limits. No mass or
hydronephrosis visualized.

Bladder:

Appears normal for degree of bladder distention. Incidental note
made of small left pleural effusion
IMPRESSION: 1. Normal renal ultrasound
2. Incidental note made of tiny left pleural effusion

## 2019-09-23 ENCOUNTER — Other Ambulatory Visit: Payer: Self-pay

## 2019-09-23 DIAGNOSIS — Z7901 Long term (current) use of anticoagulants: Secondary | ICD-10-CM | POA: Insufficient documentation

## 2019-09-23 DIAGNOSIS — M25571 Pain in right ankle and joints of right foot: Secondary | ICD-10-CM | POA: Insufficient documentation

## 2019-09-23 DIAGNOSIS — N182 Chronic kidney disease, stage 2 (mild): Secondary | ICD-10-CM | POA: Insufficient documentation

## 2019-09-23 DIAGNOSIS — E1122 Type 2 diabetes mellitus with diabetic chronic kidney disease: Secondary | ICD-10-CM | POA: Diagnosis not present

## 2019-09-23 DIAGNOSIS — I129 Hypertensive chronic kidney disease with stage 1 through stage 4 chronic kidney disease, or unspecified chronic kidney disease: Secondary | ICD-10-CM | POA: Diagnosis not present

## 2019-09-23 DIAGNOSIS — Z794 Long term (current) use of insulin: Secondary | ICD-10-CM | POA: Insufficient documentation

## 2019-09-24 ENCOUNTER — Encounter (HOSPITAL_COMMUNITY): Payer: Self-pay

## 2019-09-24 ENCOUNTER — Ambulatory Visit (HOSPITAL_COMMUNITY): Admission: RE | Admit: 2019-09-24 | Payer: PRIVATE HEALTH INSURANCE | Source: Ambulatory Visit

## 2019-09-24 ENCOUNTER — Emergency Department (HOSPITAL_COMMUNITY)
Admission: EM | Admit: 2019-09-24 | Discharge: 2019-09-24 | Disposition: A | Discharge status: Home / Self Care | Payer: PRIVATE HEALTH INSURANCE | Attending: Emergency Medicine | Admitting: Emergency Medicine

## 2019-09-24 ENCOUNTER — Emergency Department (HOSPITAL_COMMUNITY): Payer: PRIVATE HEALTH INSURANCE

## 2019-09-24 DIAGNOSIS — M25571 Pain in right ankle and joints of right foot: Secondary | ICD-10-CM

## 2019-09-24 LAB — CBC WITH DIFFERENTIAL/PLATELET
Abs Immature Granulocytes: 0 10*3/uL (ref 0.00–0.07)
Basophils Absolute: 0 10*3/uL (ref 0.0–0.1)
Basophils Relative: 0 %
Eosinophils Absolute: 0.1 10*3/uL (ref 0.0–0.5)
Eosinophils Relative: 1 %
HCT: 46.7 % (ref 39.0–52.0)
Hemoglobin: 14.8 g/dL (ref 13.0–17.0)
Immature Granulocytes: 0 %
Lymphocytes Relative: 29 %
Lymphs Abs: 1.4 10*3/uL (ref 0.7–4.0)
MCH: 29.8 pg (ref 26.0–34.0)
MCHC: 31.7 g/dL (ref 30.0–36.0)
MCV: 94.2 fL (ref 80.0–100.0)
Monocytes Absolute: 0.5 10*3/uL (ref 0.1–1.0)
Monocytes Relative: 10 %
Neutro Abs: 2.8 10*3/uL (ref 1.7–7.7)
Neutrophils Relative %: 60 %
Platelets: 302 10*3/uL (ref 150–400)
RBC: 4.96 MIL/uL (ref 4.22–5.81)
RDW: 12.5 % (ref 11.5–15.5)
WBC: 4.6 10*3/uL (ref 4.0–10.5)
nRBC: 0 % (ref 0.0–0.2)

## 2019-09-24 LAB — BASIC METABOLIC PANEL
Anion gap: 7 (ref 5–15)
BUN: 18 mg/dL (ref 6–20)
CO2: 27 mmol/L (ref 22–32)
Calcium: 9.2 mg/dL (ref 8.9–10.3)
Chloride: 105 mmol/L (ref 98–111)
Creatinine, Ser: 1.37 mg/dL — ABNORMAL HIGH (ref 0.61–1.24)
GFR calc Af Amer: >60 mL/min (ref >60)
GFR calc non Af Amer: >60 mL/min (ref >60)
Glucose, Bld: 79 mg/dL (ref 70–99)
Potassium: 4.2 mmol/L (ref 3.5–5.1)
Sodium: 139 mmol/L (ref 135–145)

## 2019-09-24 NOTE — Discharge Instructions (Addendum)
No clear cause for your symptoms was found tonight.  Your x-ray and blood work revealed no evidence of infection or fracture.  Its possible that your pain could be due to gout, sprain, or possible a DVT (blood clot).  I recommend that you have an ultrasound done tomorrow to rule out blood clot.  If this test is negative, please follow-up with your doctor as needed.  Please return to the ER for new or worsening symptoms.

## 2019-09-24 NOTE — ED Provider Notes (Signed)
[facility] DEPT Provider Note   CSN: [id number] Arrival date & time: 09/23/19  2358     History Chief Complaint  Patient presents with  . Ankle Pain    [name] is a 49 y.o. male.  Patient presents to the ED with a chief complaint of right ankle pain.  He has hx of prior staff infection that required aggressive I&D.  Patient has had poor wound healing due to DM and is seen in the wound care center.  He states that he sees wound care weekly.  He reports that today he noticed some increased swelling around his ankle. He denies any new discharge or redness around the wound.   He denies any fevers or chills.  He states that he has had gout before, but states this doesn't really feel like gout.  He is concerned he might have rolled the ankle, but can't recall a specific injury.  He reports increased pain with ambulation.  The history is provided by the patient. No language interpreter was used.       Past Medical History:  Diagnosis Date  . Acute blood loss as cause of postoperative anemia 04/18/2017  . ARF (acute renal failure) ([place])   . Cellulitis of right lower leg 03/31/2017  . Chronic anticoagulation 03/31/2017  . Diabetes mellitus without complication ([place])   . Diabetic ulcer of calf ([place]) 03/31/2017  . DVT of proximal lower limb ([place]) 03/20/2017   R calf area  . Escherichia coli sepsis ([place]) 04/18/2017  . Gout   . History of hiatal hernia   . Hypertension   . Hypoalbuminemia 03/31/2017  . Necrotizing fasciitis ([place])   . Right leg DVT ([place]) 03/31/2017  . Sepsis ([place]) 03/30/2017  . Sinus tachycardia 03/31/2017  . Type 2 diabetes mellitus with stage 2 chronic kidney disease, with long-term current use of insulin ([place]) 03/27/2017    Patient Active Problem List   Diagnosis Date Noted  . Venous stasis ulcer ([place]) 12/31/2018  . Chronic kidney disease (CKD) stage G2/A2, mildly decreased glomerular filtration rate (GFR) between 60-89 mL/min/1.73  square meter and albuminuria creatinine ratio between 30-299 mg/g 12/31/2018  . Septic arthritis of elbow, right ([place]) 12/30/2018  . Chronic venous insufficiency 02/06/2018  . Chronic anticoagulation 03/31/2017  . Type 2 diabetes mellitus with stage 2 chronic kidney disease, with long-term current use of insulin ([place]) 03/27/2017  . History of DVT (deep vein thrombosis) 03/27/2017  . Essential hypertension 01/28/2008    Past Surgical History:  Procedure Laterality Date  . COLONOSCOPY WITH PROPOFOL N/A 02/10/2018   Procedure: COLONOSCOPY WITH PROPOFOL;  Surgeon: Lin Landsman, MD;  Location: [facility] ENDOSCOPY;  Service: Gastroenterology;  Laterality: N/A;  . ESOPHAGOGASTRODUODENOSCOPY (EGD) WITH PROPOFOL N/A 02/10/2018   Procedure: ESOPHAGOGASTRODUODENOSCOPY (EGD) WITH PROPOFOL;  Surgeon: Lin Landsman, MD;  Location: [facility] ENDOSCOPY;  Service: Gastroenterology;  Laterality: N/A;  . I & D EXTREMITY Right 04/03/2017   Procedure: IRRIGATION AND DEBRIDEMENT RIGHT LEG, APPLY WOUND VAC;  Surgeon: Newt Minion, MD;  Location: [facility];  Service: Orthopedics;  Laterality: Right;  . I & D EXTREMITY Right 04/05/2017   Procedure: IRRIGATION AND DEBRIDEMENT RIGHT LEG;  Surgeon: Newt Minion, MD;  Location: [facility];  Service: Orthopedics;  Laterality: Right;  . IRRIGATION AND DEBRIDEMENT ABSCESS Right 12/30/2018   Procedure: MINOR INCISION AND DRAINAGE OF ABSCESS;  Surgeon: Verner Mould, MD;  Location: [facility] ORS;  Service: Orthopedics;  Laterality: Right;  . SCROTAL SURGERY  2006  . SKIN SPLIT GRAFT Right 04/10/2017   Procedure: SKIN GRAFT SPLIT THICKNESS RIGHT LEG;  Surgeon: Newt Minion, MD;  Location: [facility];  Service: Orthopedics;  Laterality: Right;       Family History  Problem Relation Age of Onset  . Hypertension Mother   . Stroke Mother   . Diabetes Father   . Renal Disease Father   . Hypertension Sister   . Diabetes Sister   . Renal Disease Sister   . Iron deficiency Sister      Social History   Tobacco Use  . Smoking status: Never Smoker  . Smokeless tobacco: Never Used  Substance Use Topics  . Alcohol use: No  . Drug use: No    Home Medications Prior to Admission medications   Medication Sig Start Date End Date Taking? Authorizing Provider  ACCU-CHEK FASTCLIX LANCETS MISC U UTD TO CHECK BLOOD SUGAR TID 01/13/18   [provider]  ACCU-CHEK GUIDE test strip USE TO TEST QID UTD 12/13/17   [provider]  amLODipine (NORVASC) 10 MG tablet Take 10 mg by mouth daily.    [provider]  cyclobenzaprine (FLEXERIL) 5 MG tablet Take 1 tablet (5 mg total) by mouth 3 (three) times daily as needed for muscle spasms. 04/12/17   Hosie Poisson, MD  glucose blood (ACCU-CHEK COMPACT PLUS) test strip Use 4 (four) times daily Use as instructed. 11/13/17   [provider]  hydrALAZINE (APRESOLINE) 50 MG tablet Take 1 tablet (50 mg total) by mouth 3 (three) times daily for 30 days. 01/02/19 02/01/19  Elodia Florence., MD  hydrochlorothiazide (HYDRODIURIL) 25 MG tablet Take 25 mg by mouth daily.  05/08/18   [provider]  HYDROcodone-acetaminophen (NORCO/VICODIN) 5-325 MG tablet Take 1 tablet by mouth every 4 (four) hours as needed for moderate pain.  03/19/18   [provider]  insulin aspart (NOVOLOG FLEXPEN) 100 UNIT/ML FlexPen Inject 2-15 Units into the skin 4 (four) times daily. Sliding scale 121-150 2 units; 151-200 3 units; 201-250 5 units; 251-300 8 units; 301-350 11 units; 351-400 15 units, greater than 400 - 15 units, call doctor.    [provider]  insulin glargine (LANTUS) 100 UNIT/ML injection Inject 32 Units into the skin daily.    [provider]  lisinopril (PRINIVIL,ZESTRIL) 40 MG tablet Take 40 mg by mouth daily.  05/09/18   [provider]  metoprolol tartrate (LOPRESSOR) 100 MG tablet Take 1 tablet (100 mg total) by mouth 2 (two) times daily. 04/12/17   Hosie Poisson, MD    omeprazole (PRILOSEC) 40 MG capsule Take 1 capsule (40 mg total) by mouth 2 (two) times daily. 05/23/18 12/30/18  Lin Landsman, MD  rivaroxaban (XARELTO) 20 MG TABS tablet Take 20 mg by mouth daily with supper.    [provider]    Allergies    Patient has no known allergies.  Review of Systems   Review of Systems  Constitutional: Negative for chills and fever.  Cardiovascular: Positive for leg swelling.  Skin: Positive for color change and wound.       No new changes    Physical Exam Updated Vital Signs BP (!) 137/98 (BP Location: Left Arm)   Pulse 70   Temp 98 F (36.7 C) (Oral)   Resp 15   Ht 5\' 9"  (1.753 m)   Wt 115.7 kg   SpO2 98%   BMI 37.66 kg/m   Physical Exam Vitals and nursing note reviewed.  Constitutional:      General: He is not in acute distress.    Appearance: He is well-developed. He is not ill-appearing.  HENT:     Head: Normocephalic and atraumatic.  Eyes:     Conjunctiva/sclera: Conjunctivae normal.  Cardiovascular:     Rate and Rhythm: Normal rate.     Pulses: Normal pulses.     Comments: Intact distal pulses Pulmonary:     Effort: Pulmonary effort is normal. No respiratory distress.  Abdominal:     General: There is no distension.  Musculoskeletal:     Cervical back: Neck supple.     Comments: ROM and strength of right ankle 5/5 No bony deformity  Skin:    General: Skin is warm and dry.     Comments: No noticeable swelling or edema on my exam of the right ankle and leg.  There is a chronic wound to the distal/lateral lower leg that has granulation tissue and no evidence of infection or abscess  Neurological:     Mental Status: He is alert and oriented to person, place, and time.  Psychiatric:        Mood and Affect: Mood normal.        Behavior: Behavior normal.     ED Results / Procedures / Treatments   Labs (all labs ordered are listed, but only abnormal results are displayed) Labs Reviewed  BASIC METABOLIC PANEL  - Abnormal; Notable for the following components:      Result Value   Creatinine, Ser 1.37 (*)    All other components within normal limits  CBC WITH DIFFERENTIAL/PLATELET    EKG None  Radiology DG Ankle Complete Right  Result Date: 09/24/2019 CLINICAL DATA:  Pain. EXAM: RIGHT ANKLE - COMPLETE 3+ VIEW COMPARISON:  None. FINDINGS: There is soft tissue swelling about the ankle. There is no displaced fracture or dislocation. Multiple staples project over the patient's distal right lower extremity. There is no evidence for osteomyelitis. IMPRESSION: Negative. Electronically Signed   By: Constance Holster M.D.   On: 09/24/2019 01:00    Procedures Procedures (including critical care time)  Medications Ordered in ED Medications - No data to display  ED Course  I have reviewed the triage vital signs and the nursing notes.  Pertinent labs & imaging results that were available during my care of the patient were reviewed by me and considered in my medical decision making (see chart for details).    MDM Rules/Calculators/A&P                      Patient with reported pain and swelling of the right ankle.  I don't see any clear signs of infection on physical exam.  There is normal appearing granulation tissue.  There is no evidence of abscess or cellulitis.    Labs show no leukocytosis or severe electrolyte derangement.  X-ray negative.  Consider DVT, but no palpable calf tenderness.  I doubt DVT, but have offered [place] to rule out DVT in the morning.  Return precautions given.  Patient understands and agrees with the plan.  He ambulates with a cane. Final Clinical Impression(s) / ED Diagnoses Final diagnoses:  Acute right ankle pain    Rx / DC Orders ED Discharge Orders         Ordered    VAS [place] LOWER EXTREMITY VENOUS (DVT)     09/24/19 0249           Montine Circle, PA-C 09/24/19 [id number]  Palumbo, April, MD 09/24/19 [id number]

## 2019-09-24 NOTE — ED Triage Notes (Signed)
Pt reports R ankle pain and swelling that started this morning at work. Denies injury. Pt reports a chronic wound above the location of the swelling.

## 2019-09-24 NOTE — Progress Notes (Signed)
Orthopedic Tech Progress Note Patient Details:  [name] 10-23-1970 [id number]  Ortho Devices Type of Ortho Device: Other (comment) Ortho Device/Splint Location: After speaking with the Dr and explaining that we did not have the same items that were taken off the pts leg we agreed on a bulky compressive dressing with a coban wrap. the wrap that was taken off had a coban wrap. the rn's put on the dressing to the wound and i applied extra padding to the wound. Ortho Device/Splint Interventions: Ordered, Application, Adjustment   Post Interventions Patient Tolerated: Well Instructions Provided: Care of device, Adjustment of device   Karolee Stamps 09/24/2019, 3:07 AM

## 2019-12-25 ENCOUNTER — Emergency Department
Admission: EM | Admit: 2019-12-25 | Discharge: 2019-12-25 | Disposition: A | Discharge status: Home / Self Care | Payer: PRIVATE HEALTH INSURANCE | Attending: Emergency Medicine | Admitting: Emergency Medicine

## 2019-12-25 ENCOUNTER — Other Ambulatory Visit: Payer: Self-pay

## 2019-12-25 ENCOUNTER — Emergency Department: Payer: PRIVATE HEALTH INSURANCE

## 2019-12-25 ENCOUNTER — Encounter: Payer: Self-pay | Admitting: Emergency Medicine

## 2019-12-25 DIAGNOSIS — K2970 Gastritis, unspecified, without bleeding: Secondary | ICD-10-CM | POA: Diagnosis not present

## 2019-12-25 DIAGNOSIS — Z86718 Personal history of other venous thrombosis and embolism: Secondary | ICD-10-CM | POA: Diagnosis not present

## 2019-12-25 DIAGNOSIS — Z794 Long term (current) use of insulin: Secondary | ICD-10-CM | POA: Diagnosis not present

## 2019-12-25 DIAGNOSIS — R0602 Shortness of breath: Secondary | ICD-10-CM | POA: Diagnosis not present

## 2019-12-25 DIAGNOSIS — Z79899 Other long term (current) drug therapy: Secondary | ICD-10-CM | POA: Insufficient documentation

## 2019-12-25 DIAGNOSIS — I129 Hypertensive chronic kidney disease with stage 1 through stage 4 chronic kidney disease, or unspecified chronic kidney disease: Secondary | ICD-10-CM | POA: Insufficient documentation

## 2019-12-25 DIAGNOSIS — E1122 Type 2 diabetes mellitus with diabetic chronic kidney disease: Secondary | ICD-10-CM | POA: Insufficient documentation

## 2019-12-25 DIAGNOSIS — R1013 Epigastric pain: Secondary | ICD-10-CM | POA: Diagnosis not present

## 2019-12-25 DIAGNOSIS — N182 Chronic kidney disease, stage 2 (mild): Secondary | ICD-10-CM | POA: Diagnosis not present

## 2019-12-25 DIAGNOSIS — Z7901 Long term (current) use of anticoagulants: Secondary | ICD-10-CM | POA: Insufficient documentation

## 2019-12-25 DIAGNOSIS — Z20822 Contact with and (suspected) exposure to covid-19: Secondary | ICD-10-CM | POA: Diagnosis not present

## 2019-12-25 LAB — URINALYSIS, COMPLETE (UACMP) WITH MICROSCOPIC
Bacteria, UA: NONE SEEN
Bilirubin Urine: NEGATIVE
Glucose, UA: NEGATIVE mg/dL
Hgb urine dipstick: NEGATIVE
Ketones, ur: NEGATIVE mg/dL
Leukocytes,Ua: NEGATIVE
Nitrite: NEGATIVE
Protein, ur: NEGATIVE mg/dL
Specific Gravity, Urine: 1.017 (ref 1.005–1.030)
Squamous Epithelial / HPF: NONE SEEN (ref 0–5)
pH: 6 (ref 5.0–8.0)

## 2019-12-25 LAB — CBC WITH DIFFERENTIAL/PLATELET
Abs Immature Granulocytes: 0.02 10*3/uL (ref 0.00–0.07)
Basophils Absolute: 0 10*3/uL (ref 0.0–0.1)
Basophils Relative: 0 %
Eosinophils Absolute: 0 10*3/uL (ref 0.0–0.5)
Eosinophils Relative: 0 %
HCT: 45.5 % (ref 39.0–52.0)
Hemoglobin: 15 g/dL (ref 13.0–17.0)
Immature Granulocytes: 0 %
Lymphocytes Relative: 16 %
Lymphs Abs: 1.1 10*3/uL (ref 0.7–4.0)
MCH: 29.2 pg (ref 26.0–34.0)
MCHC: 33 g/dL (ref 30.0–36.0)
MCV: 88.5 fL (ref 80.0–100.0)
Monocytes Absolute: 0.4 10*3/uL (ref 0.1–1.0)
Monocytes Relative: 6 %
Neutro Abs: 5.5 10*3/uL (ref 1.7–7.7)
Neutrophils Relative %: 78 %
Platelets: 263 10*3/uL (ref 150–400)
RBC: 5.14 MIL/uL (ref 4.22–5.81)
RDW: 13.4 % (ref 11.5–15.5)
WBC: 7.1 10*3/uL (ref 4.0–10.5)
nRBC: 0 % (ref 0.0–0.2)

## 2019-12-25 LAB — TROPONIN I (HIGH SENSITIVITY)
Troponin I (High Sensitivity): 11 ng/L (ref <18)
Troponin I (High Sensitivity): 10 ng/L (ref <18)

## 2019-12-25 LAB — COMPREHENSIVE METABOLIC PANEL
ALT: 17 U/L (ref 0–44)
AST: 19 U/L (ref 15–41)
Albumin: 4 g/dL (ref 3.5–5.0)
Alkaline Phosphatase: 60 U/L (ref 38–126)
Anion gap: 10 (ref 5–15)
BUN: 26 mg/dL — ABNORMAL HIGH (ref 6–20)
CO2: 26 mmol/L (ref 22–32)
Calcium: 9.4 mg/dL (ref 8.9–10.3)
Chloride: 104 mmol/L (ref 98–111)
Creatinine, Ser: 1.73 mg/dL — ABNORMAL HIGH (ref 0.61–1.24)
GFR calc Af Amer: 53 mL/min — ABNORMAL LOW (ref >60)
GFR calc non Af Amer: 46 mL/min — ABNORMAL LOW (ref >60)
Glucose, Bld: 124 mg/dL — ABNORMAL HIGH (ref 70–99)
Potassium: 4 mmol/L (ref 3.5–5.1)
Sodium: 140 mmol/L (ref 135–145)
Total Bilirubin: 0.8 mg/dL (ref 0.3–1.2)
Total Protein: 8 g/dL (ref 6.5–8.1)

## 2019-12-25 LAB — SARS CORONAVIRUS 2 BY RT PCR (HOSPITAL ORDER, PERFORMED IN ~~LOC~~ HOSPITAL LAB): SARS Coronavirus 2: NEGATIVE

## 2019-12-25 LAB — LIPASE, BLOOD: Lipase: 24 U/L (ref 11–51)

## 2019-12-25 LAB — FIBRIN DERIVATIVES D-DIMER (ARMC ONLY): Fibrin derivatives D-dimer (ARMC): 1701.14 ng/mL (FEU) — ABNORMAL HIGH (ref 0.00–499.00)

## 2019-12-25 MED ORDER — IOHEXOL 350 MG/ML SOLN
100.0000 mL | Freq: Once | INTRAVENOUS | Status: AC | PRN
  Administered 2019-12-25: 100 mL via INTRAVENOUS

## 2019-12-25 MED ORDER — PANTOPRAZOLE SODIUM 20 MG PO TBEC: 20.0000 mg | DELAYED_RELEASE_TABLET | Freq: Every day | ORAL | 0 refills | Status: DC

## 2019-12-25 MED ORDER — HYDROCODONE-ACETAMINOPHEN 5-325 MG PO TABS: 1.0000 | ORAL_TABLET | ORAL | 0 refills | Status: DC | PRN

## 2019-12-25 MED ORDER — PANTOPRAZOLE SODIUM 40 MG PO TBEC: 40.0000 mg | DELAYED_RELEASE_TABLET | Freq: Every day | ORAL | 1 refills | Status: DC

## 2019-12-25 NOTE — ED Notes (Signed)
Pt resting comfortably with eyes closed

## 2019-12-25 NOTE — ED Triage Notes (Signed)
Pt to triage via w/c with no distress noted, mask in place; pt reports mid upper abd pain accomp by [facility] tonight; st hx of same with hernia

## 2019-12-25 NOTE — ED Provider Notes (Signed)
-----------------------------------------   10:03 AM on 12/25/2019 -----------------------------------------  Patient CTA is negative.  Lab work is otherwise largely nonrevealing including troponin negative x2.  I have spoken to the patient.  He denies any discomfort at this time.  We will place the patient on Protonix, short course of pain medication if the pain were to return as well as GI medicine referral.  I spoke to the patient if he has any development of significant pain or pain traveling into his chest shortness of breath or diaphoresis he needs to return to the emergency department.  Patient agreeable to plan of care.   Harvest Dark, MD 12/25/19 1004

## 2019-12-25 NOTE — ED Notes (Signed)
CXR done

## 2019-12-25 NOTE — ED Provider Notes (Signed)
[facility] Emergency Department Provider Note  ____________________________________________   First MD Initiated Contact with Patient 12/25/19 [phone]     (approximate)  I have reviewed the triage vital signs and the nursing notes.   HISTORY  Chief Complaint Abdominal Pain    HPI [name] is a 49 y.o. male with diabetes, prior DVT who comes in with abdominal pain, shortness of breath.  Patient reports that he has had some upper abdominal pain for the past 2 days.  States that he was told that he had a hernia but had not had any issues for over a year.  He states that he was at work today when he started feeling like he was having a lot of sweating all over his body although he was hardly exerting himself.  Sweating seem to stop after a few minutes but then when he was walking to his car he felt very short of breath.  Patient notes that he was recently taken off his blood thinner 3 months ago after having a DVT.  Denies any recurrent leg swelling.  He states that his abdominal pain and shortness of breath have now resolved       Past Medical History:  Diagnosis Date  . Acute blood loss as cause of postoperative anemia 04/18/2017  . ARF (acute renal failure) ([place])   . Cellulitis of right lower leg 03/31/2017  . Chronic anticoagulation 03/31/2017  . Diabetes mellitus without complication ([place])   . Diabetic ulcer of calf ([place]) 03/31/2017  . DVT of proximal lower limb ([place]) 03/20/2017   R calf area  . Escherichia coli sepsis ([place]) 04/18/2017  . Gout   . History of hiatal hernia   . Hypertension   . Hypoalbuminemia 03/31/2017  . Necrotizing fasciitis ([place])   . Right leg DVT ([place]) 03/31/2017  . Sepsis ([place]) 03/30/2017  . Sinus tachycardia 03/31/2017  . Type 2 diabetes mellitus with stage 2 chronic kidney disease, with long-term current use of insulin ([place]) 03/27/2017    Patient Active Problem List   Diagnosis Date Noted  . Venous stasis ulcer ([place])  12/31/2018  . Chronic kidney disease (CKD) stage G2/A2, mildly decreased glomerular filtration rate (GFR) between 60-89 mL/min/1.73 square meter and albuminuria creatinine ratio between 30-299 mg/g 12/31/2018  . Septic arthritis of elbow, right ([place]) 12/30/2018  . Chronic venous insufficiency 02/06/2018  . Chronic anticoagulation 03/31/2017  . Type 2 diabetes mellitus with stage 2 chronic kidney disease, with long-term current use of insulin ([place]) 03/27/2017  . History of DVT (deep vein thrombosis) 03/27/2017  . Essential hypertension 01/28/2008    Past Surgical History:  Procedure Laterality Date  . COLONOSCOPY WITH PROPOFOL N/A 02/10/2018   Procedure: COLONOSCOPY WITH PROPOFOL;  Surgeon: Lin Landsman, MD;  Location[facility] ENDOSCOPY;  Service: Gastroenterology;  Laterality: N/A;  . ESOPHAGOGASTRODUODENOSCOPY (EGD) WITH PROPOFOL N/A 02/10/2018   Procedure: ESOPHAGOGASTRODUODENOSCOPY (EGD) WITH PROPOFOL;  Surgeon: Lin Landsman, MD;  Location: [facility] ENDOSCOPY;  Service: Gastroenterology;  Laterality: N/A;  . I & D EXTREMITY Right 04/03/2017   Procedure: IRRIGATION AND DEBRIDEMENT RIGHT LEG, APPLY WOUND VAC;  Surgeon: Newt Minion, MD;  Location: [place];  Service: Orthopedics;  Laterality: Right;  . I & D EXTREMITY Right 04/05/2017   Procedure: IRRIGATION AND DEBRIDEMENT RIGHT LEG;  Surgeon: Newt Minion, MD;  Location: [place];  Service: Orthopedics;  Laterality: Right;  . IRRIGATION AND DEBRIDEMENT ABSCESS Right 12/30/2018   Procedure: MINOR INCISION AND DRAINAGE OF ABSCESS;  Surgeon: Verner Mould, MD;  Location: [facility];  Service: Orthopedics;  Laterality: Right;  . SCROTAL SURGERY  2006  . SKIN SPLIT GRAFT Right 04/10/2017   Procedure: SKIN GRAFT SPLIT THICKNESS RIGHT LEG;  Surgeon: Newt Minion, MD;  Location: [place];  Service: Orthopedics;  Laterality: Right;    Prior to Admission medications   Medication Sig Start Date End Date Taking? Authorizing Provider  ACCU-CHEK  FASTCLIX LANCETS MISC U UTD TO CHECK BLOOD SUGAR TID 01/13/18   [provider]  ACCU-CHEK GUIDE test strip USE TO TEST QID UTD 12/13/17   [provider]  amLODipine (NORVASC) 10 MG tablet Take 10 mg by mouth daily.    [provider]  cyclobenzaprine (FLEXERIL) 5 MG tablet Take 1 tablet (5 mg total) by mouth 3 (three) times daily as needed for muscle spasms. 04/12/17   Hosie Poisson, MD  glucose blood (ACCU-CHEK COMPACT PLUS) test strip Use 4 (four) times daily Use as instructed. 11/13/17   [provider]  hydrALAZINE (APRESOLINE) 50 MG tablet Take 1 tablet (50 mg total) by mouth 3 (three) times daily for 30 days. 01/02/19 02/01/19  Elodia Florence., MD  hydrochlorothiazide (HYDRODIURIL) 25 MG tablet Take 25 mg by mouth daily.  05/08/18   [provider]  HYDROcodone-acetaminophen (NORCO/VICODIN) 5-325 MG tablet Take 1 tablet by mouth every 4 (four) hours as needed for moderate pain.  03/19/18   [provider]  insulin aspart (NOVOLOG FLEXPEN) 100 UNIT/ML FlexPen Inject 2-15 Units into the skin 4 (four) times daily. Sliding scale 121-150 2 units; 151-200 3 units; 201-250 5 units; 251-300 8 units; 301-350 11 units; 351-400 15 units, greater than 400 - 15 units, call doctor.    [provider]  insulin glargine (LANTUS) 100 UNIT/ML injection Inject 32 Units into the skin daily.    [provider]  lisinopril (PRINIVIL,ZESTRIL) 40 MG tablet Take 40 mg by mouth daily.  05/09/18   [provider]  metoprolol tartrate (LOPRESSOR) 100 MG tablet Take 1 tablet (100 mg total) by mouth 2 (two) times daily. 04/12/17   Hosie Poisson, MD  omeprazole (PRILOSEC) 40 MG capsule Take 1 capsule (40 mg total) by mouth 2 (two) times daily. 05/23/18 12/30/18  Lin Landsman, MD  rivaroxaban (XARELTO) 20 MG TABS tablet Take 20 mg by mouth daily with supper.    [provider]    Allergies Patient has no known allergies.  Family  History  Problem Relation Age of Onset  . Hypertension Mother   . Stroke Mother   . Diabetes Father   . Renal Disease Father   . Hypertension Sister   . Diabetes Sister   . Renal Disease Sister   . Iron deficiency Sister     Social History Social History   Tobacco Use  . Smoking status: Never Smoker  . Smokeless tobacco: Never Used  Vaping Use  . Vaping Use: Never used  Substance Use Topics  . Alcohol use: No  . Drug use: No      Review of Systems Constitutional: No fever/chills, positive sweating Eyes: No visual changes. ENT: No sore throat. Cardiovascular: Denies chest pain. Respiratory: Positive shortness of breath Gastrointestinal: Positive abdominal pain no nausea, no vomiting.  No diarrhea.  No constipation. Genitourinary: Negative for dysuria. Musculoskeletal: Negative for back pain. Skin: Negative for rash. Neurological: Negative for headaches, focal weakness or numbness. All other ROS negative ____________________________________________   PHYSICAL EXAM:  VITAL SIGNS: ED Triage  Vitals  Enc Vitals Group     BP 12/25/19 0124 133/89     Pulse Rate 12/25/19 0124 64     Resp 12/25/19 0124 18     Temp 12/25/19 0124 98.8 F (37.1 C)     Temp Source 12/25/19 0124 Oral     SpO2 12/25/19 0124 99 %     Weight 12/25/19 0124 275 lb (124.7 kg)     Height 12/25/19 0124 5\' 11"  (1.803 m)     Head Circumference --      Peak Flow --      Pain Score 12/25/19 0123 9     Pain Loc --      Pain Edu? --      Excl. in [place]? --     Constitutional: Alert and oriented. Well appearing and in no acute distress. Eyes: Conjunctivae are normal. EOMI. Head: Atraumatic. Nose: No congestion/rhinnorhea. Mouth/Throat: Mucous membranes are moist.   Neck: No stridor. Trachea Midline. FROM Cardiovascular: Normal rate, regular rhythm. Grossly normal heart sounds.  Good peripheral circulation. Respiratory: Normal respiratory effort.  No retractions. Lungs CTAB. Gastrointestinal:  Soft and nontender. No distention. No abdominal bruits.  Musculoskeletal: No lower extremity tenderness nor edema.  No joint effusions.  Compression socks on both legs Neurologic:  Normal speech and language. No gross focal neurologic deficits are appreciated.  Skin:  Skin is warm, dry and intact. No rash noted. Psychiatric: Mood and affect are normal. Speech and behavior are normal. GU: Deferred   ____________________________________________   LABS (all labs ordered are listed, but only abnormal results are displayed)  Labs Reviewed  COMPREHENSIVE METABOLIC PANEL - Abnormal; Notable for the following components:      Result Value   Glucose, Bld 124 (*)    BUN 26 (*)    Creatinine, Ser 1.73 (*)    GFR calc non Af Amer 46 (*)    GFR calc Af Amer 53 (*)    All other components within normal limits  URINALYSIS, COMPLETE (UACMP) WITH MICROSCOPIC - Abnormal; Notable for the following components:   Color, Urine YELLOW (*)    APPearance CLEAR (*)    All other components within normal limits  FIBRIN DERIVATIVES D-DIMER ([facility] ONLY) - Abnormal; Notable for the following components:   Fibrin derivatives D-dimer ([facility]) 1,701.14 (*)    All other components within normal limits  SARS CORONAVIRUS 2 BY RT PCR (HOSPITAL ORDER, [facility])  CBC WITH DIFFERENTIAL/PLATELET  LIPASE, BLOOD  TROPONIN I (HIGH SENSITIVITY)  TROPONIN I (HIGH SENSITIVITY)   ____________________________________________   ED ECG REPORT I, Vanessa , the attending physician, personally viewed and interpreted this ECG.  Normal sinus rate 51, no ST elevation, no T wave inversions, normal intervals ____________________________________________  RADIOLOGY Robert Bellow, personally viewed and evaluated these images (plain radiographs) as part of my medical decision making, as well as reviewing the written report by the radiologist.  ED MD interpretation: No pneumonia  Official radiology  report(s): DG Chest Portable 1 View  Result Date: 12/25/2019 CLINICAL DATA:  Shortness of breath, upper abdominal pain with shortness of breath EXAM: PORTABLE CHEST 1 VIEW COMPARISON:  Radiograph 04/01/2017 FINDINGS: Bandlike opacity in the left lung base likely reflecting subsegmental atelectasis with additional mild streaky basilar opacities favoring further atelectatic change. Increased attenuation towards the bases is at least partially attributable to body habitus. No consolidation, features of edema, pneumothorax, or effusion. The cardiomediastinal contours are unremarkable. No acute osseous or soft tissue  abnormality. IMPRESSION: Basilar atelectatic changes. No other acute cardiopulmonary abnormality. Electronically Signed   By: Lovena Le M.D.   On: 12/25/2019 05:58    ____________________________________________   PROCEDURES  Procedure(s) performed (including Critical Care):  Procedures   ____________________________________________   INITIAL IMPRESSION / ASSESSMENT AND PLAN / ED COURSE  [name] was evaluated in Emergency Department on 12/25/2019 for the symptoms described in the history of present illness. He was evaluated in the context of the global COVID-19 pandemic, which necessitated consideration that the patient might be at risk for infection with the SARS-CoV-2 virus that causes COVID-19. Institutional protocols and algorithms that pertain to the evaluation of patients at risk for COVID-19 are in a state of rapid change based on information released by regulatory bodies including the CDC and federal and state organizations. These policies and algorithms were followed during the patient's care in the ED.    Patient is a 49 year old who is feeling much better but came in secondary to sweating episode with shortness of breath.  His symptoms have since mostly resolved however he is not been on his blood thinner for the past 3 months after completing a course for DVT.   Given patient is otherwise low risk will get D-dimer to evaluate for PE.  Chest x-ray to evaluate for pneumonia, pneumothorax.  Will get cardiac markers to evaluate for ACS.  His abdomen feels soft and nontender and low suspicion for acute abdominal process.  Consider gastritis.  I reviewed his prior CT imaging is no prior indication of hernia.  At this time I think we can start him on a PPI and he can return if he develops worsening abdominal pain.   UA no evidence of UTI Labs are reassuring except kidney function is 1.73 up from 3 months ago Troponin was 11  Repeat cardiac marker rules out for ACS  Covid is negative  D-dimer is positive.  Will get CT PE.  Reevaluated patient continues to have no abdominal pain.  Discussed with patient and he would like to hold off on CT abdomen given no continued pain I do suspect this is more likely gastritis.  He is also had recent ultrasound does not show any signs of gallbladder pathology.  Patient handed off to oncoming team pending CT PE and if negative most likely discharge home.  At this time patient denies any symptoms and feels comfortable with discharge home if CT is negative     ____________________________________________   FINAL CLINICAL IMPRESSION(S) / ED DIAGNOSES   Final diagnoses:  None      MEDICATIONS GIVEN DURING THIS VISIT:  Medications - No data to display   ED Discharge Orders         Ordered    pantoprazole (PROTONIX) 20 MG tablet  Daily     Discontinue  Reprint     12/25/19 0729           Note:  This document was prepared using Dragon voice recognition software and may include unintentional dictation errors.   Vanessa [place], MD 12/25/19 0730

## 2019-12-25 NOTE — Discharge Instructions (Addendum)
Take the acid reducer for possible gastritis for your epigastric abdominal pain.  Return the ER if you develop worsening pain

## 2019-12-25 NOTE — ED Notes (Signed)
Pt in with co epigastric pain that started yesterday. Hx of the same intermittently for years and was dx with hernia. Pt denies any n.v.d. or dysuria. Pt is painfree at this time.

## 2019-12-29 ENCOUNTER — Encounter (HOSPITAL_COMMUNITY): Payer: Self-pay

## 2019-12-29 ENCOUNTER — Emergency Department (HOSPITAL_COMMUNITY)
Admission: EM | Admit: 2019-12-29 | Discharge: 2019-12-29 | Disposition: A | Discharge status: Home / Self Care | Payer: PRIVATE HEALTH INSURANCE | Attending: Emergency Medicine | Admitting: Emergency Medicine

## 2019-12-29 ENCOUNTER — Other Ambulatory Visit: Payer: Self-pay

## 2019-12-29 DIAGNOSIS — Z79899 Other long term (current) drug therapy: Secondary | ICD-10-CM | POA: Insufficient documentation

## 2019-12-29 DIAGNOSIS — E1122 Type 2 diabetes mellitus with diabetic chronic kidney disease: Secondary | ICD-10-CM | POA: Insufficient documentation

## 2019-12-29 DIAGNOSIS — R1013 Epigastric pain: Secondary | ICD-10-CM | POA: Diagnosis present

## 2019-12-29 DIAGNOSIS — K2900 Acute gastritis without bleeding: Secondary | ICD-10-CM

## 2019-12-29 DIAGNOSIS — Z794 Long term (current) use of insulin: Secondary | ICD-10-CM | POA: Diagnosis not present

## 2019-12-29 DIAGNOSIS — I129 Hypertensive chronic kidney disease with stage 1 through stage 4 chronic kidney disease, or unspecified chronic kidney disease: Secondary | ICD-10-CM | POA: Insufficient documentation

## 2019-12-29 DIAGNOSIS — N182 Chronic kidney disease, stage 2 (mild): Secondary | ICD-10-CM | POA: Diagnosis not present

## 2019-12-29 LAB — COMPREHENSIVE METABOLIC PANEL
ALT: 15 U/L (ref 0–44)
AST: 18 U/L (ref 15–41)
Albumin: 4.3 g/dL (ref 3.5–5.0)
Alkaline Phosphatase: 65 U/L (ref 38–126)
Anion gap: 10 (ref 5–15)
BUN: 19 mg/dL (ref 6–20)
CO2: 27 mmol/L (ref 22–32)
Calcium: 9.4 mg/dL (ref 8.9–10.3)
Chloride: 102 mmol/L (ref 98–111)
Creatinine, Ser: 1.58 mg/dL — ABNORMAL HIGH (ref 0.61–1.24)
GFR calc Af Amer: 59 mL/min — ABNORMAL LOW (ref >60)
GFR calc non Af Amer: 51 mL/min — ABNORMAL LOW (ref >60)
Glucose, Bld: 109 mg/dL — ABNORMAL HIGH (ref 70–99)
Potassium: 4 mmol/L (ref 3.5–5.1)
Sodium: 139 mmol/L (ref 135–145)
Total Bilirubin: 0.8 mg/dL (ref 0.3–1.2)
Total Protein: 8.4 g/dL — ABNORMAL HIGH (ref 6.5–8.1)

## 2019-12-29 LAB — CBC
HCT: 47.9 % (ref 39.0–52.0)
Hemoglobin: 15.6 g/dL (ref 13.0–17.0)
MCH: 29.7 pg (ref 26.0–34.0)
MCHC: 32.6 g/dL (ref 30.0–36.0)
MCV: 91.2 fL (ref 80.0–100.0)
Platelets: 256 10*3/uL (ref 150–400)
RBC: 5.25 MIL/uL (ref 4.22–5.81)
RDW: 13.2 % (ref 11.5–15.5)
WBC: 4.7 10*3/uL (ref 4.0–10.5)
nRBC: 0 % (ref 0.0–0.2)

## 2019-12-29 LAB — URINALYSIS, ROUTINE W REFLEX MICROSCOPIC
Bacteria, UA: NONE SEEN
Bilirubin Urine: NEGATIVE
Glucose, UA: NEGATIVE mg/dL
Hgb urine dipstick: NEGATIVE
Ketones, ur: NEGATIVE mg/dL
Leukocytes,Ua: NEGATIVE
Nitrite: NEGATIVE
Protein, ur: 30 mg/dL — AB
Specific Gravity, Urine: 1.016 (ref 1.005–1.030)
pH: 7 (ref 5.0–8.0)

## 2019-12-29 LAB — LIPASE, BLOOD: Lipase: 23 U/L (ref 11–51)

## 2019-12-29 MED ORDER — ALUM & MAG HYDROXIDE-SIMETH 200-200-20 MG/5ML PO SUSP
30.0000 mL | Freq: Once | ORAL | Status: AC
  Administered 2019-12-29: 30 mL via ORAL
  Filled 2019-12-29: qty 30

## 2019-12-29 MED ORDER — SODIUM CHLORIDE 0.9% FLUSH: 3.0000 mL | Freq: Once | INTRAVENOUS | Status: DC

## 2019-12-29 NOTE — Discharge Instructions (Signed)
You can take Maalox over-the-counter as needed to help with your symptoms. Continue the Protonix as well.  Be aware that this may take up to 2 weeks for improvement. Follow-up with your GI specialist with the one listed below. Return to the ER for worsening abdominal pain, vomiting, chest pain, shortness of breath or leg swelling.

## 2019-12-29 NOTE — ED Notes (Signed)
Lab draw unsuccessful 

## 2019-12-29 NOTE — ED Provider Notes (Signed)
[facility] DEPT Provider Note   CSN: [id number] Arrival date & time: 12/29/19  0102     History Chief Complaint  Patient presents with  . Abdominal Pain    [name] is a 49 y.o. male with a past medical history of CKD, hypertension, diabetes, prior DVT not currently on Xarelto, presenting to ED with a chief complaint of abdominal pain.  States for the past 5 days has been experiencing epigastric and mid abdominal pain that is worse at work.  States that he has had to leave work due to the amount of pain.  He was seen and evaluated at [facility] ED on 12/25/2019 as he was having shortness of breath along with the abdominal pain at the time.  He had negative work-up including CT angio to rule out PE.  He was given Protonix which he has been taking for 4 days but states that he has not noticed much difference.  He states that the pain is worse with moving around.  He denies any nausea, vomiting, diarrhea, urinary symptoms, chest pain or shortness of breath.  No suspicious food intake or sick contacts with similar symptoms.  Denies prior abdominal surgeries, alcohol use or chronic NSAID use.  HPI     Past Medical History:  Diagnosis Date  . Acute blood loss as cause of postoperative anemia 04/18/2017  . ARF (acute renal failure) ([place])   . Cellulitis of right lower leg 03/31/2017  . Chronic anticoagulation 03/31/2017  . Diabetes mellitus without complication ([place])   . Diabetic ulcer of calf ([place]) 03/31/2017  . DVT of proximal lower limb ([place]) 03/20/2017   R calf area  . Escherichia coli sepsis ([place]) 04/18/2017  . Gout   . History of hiatal hernia   . Hypertension   . Hypoalbuminemia 03/31/2017  . Necrotizing fasciitis ([place])   . Right leg DVT ([place]) 03/31/2017  . Sepsis ([place]) 03/30/2017  . Sinus tachycardia 03/31/2017  . Type 2 diabetes mellitus with stage 2 chronic kidney disease, with long-term current use of insulin ([place]) 03/27/2017    Patient Active  Problem List   Diagnosis Date Noted  . Venous stasis ulcer ([place]) 12/31/2018  . Chronic kidney disease (CKD) stage G2/A2, mildly decreased glomerular filtration rate (GFR) between 60-89 mL/min/1.73 square meter and albuminuria creatinine ratio between 30-299 mg/g 12/31/2018  . Septic arthritis of elbow, right ([place]) 12/30/2018  . Chronic venous insufficiency 02/06/2018  . Chronic anticoagulation 03/31/2017  . Type 2 diabetes mellitus with stage 2 chronic kidney disease, with long-term current use of insulin ([place]) 03/27/2017  . History of DVT (deep vein thrombosis) 03/27/2017  . Essential hypertension 01/28/2008    Past Surgical History:  Procedure Laterality Date  . COLONOSCOPY WITH PROPOFOL N/A 02/10/2018   Procedure: COLONOSCOPY WITH PROPOFOL;  Surgeon: Lin Landsman, MD;  Location[facility] ENDOSCOPY;  Service: Gastroenterology;  Laterality: N/A;  . ESOPHAGOGASTRODUODENOSCOPY (EGD) WITH PROPOFOL N/A 02/10/2018   Procedure: ESOPHAGOGASTRODUODENOSCOPY (EGD) WITH PROPOFOL;  Surgeon: Lin Landsman, MD;  Location: [facility] ENDOSCOPY;  Service: Gastroenterology;  Laterality: N/A;  . I & D EXTREMITY Right 04/03/2017   Procedure: IRRIGATION AND DEBRIDEMENT RIGHT LEG, APPLY WOUND VAC;  Surgeon: Newt Minion, MD;  Location: [facility];  Service: Orthopedics;  Laterality: Right;  . I & D EXTREMITY Right 04/05/2017   Procedure: IRRIGATION AND DEBRIDEMENT RIGHT LEG;  Surgeon: Newt Minion, MD;  Location: [facility];  Service: Orthopedics;  Laterality: Right;  . IRRIGATION AND DEBRIDEMENT ABSCESS Right  12/30/2018   Procedure: MINOR INCISION AND DRAINAGE OF ABSCESS;  Surgeon: Verner Mould, MD;  Location: [facility] ORS;  Service: Orthopedics;  Laterality: Right;  . SCROTAL SURGERY  2006  . SKIN SPLIT GRAFT Right 04/10/2017   Procedure: SKIN GRAFT SPLIT THICKNESS RIGHT LEG;  Surgeon: Newt Minion, MD;  Location: [facility];  Service: Orthopedics;  Laterality: Right;       Family History  Problem Relation Age  of Onset  . Hypertension Mother   . Stroke Mother   . Diabetes Father   . Renal Disease Father   . Hypertension Sister   . Diabetes Sister   . Renal Disease Sister   . Iron deficiency Sister     Social History   Tobacco Use  . Smoking status: Never Smoker  . Smokeless tobacco: Never Used  Vaping Use  . Vaping Use: Never used  Substance Use Topics  . Alcohol use: No  . Drug use: No    Home Medications Prior to Admission medications   Medication Sig Start Date End Date Taking? Authorizing Provider  amLODipine (NORVASC) 10 MG tablet Take 10 mg by mouth daily.   Yes [provider]  hydrALAZINE (APRESOLINE) 100 MG tablet Take 100 mg by mouth 3 (three) times daily. 12/25/19  Yes [provider]  hydrochlorothiazide (HYDRODIURIL) 25 MG tablet Take 25 mg by mouth daily.  05/08/18  Yes [provider]  HYDROcodone-acetaminophen (NORCO/VICODIN) 5-325 MG tablet Take 1 tablet by mouth every 4 (four) hours as needed. 12/25/19  Yes Paduchowski, Lennette Bihari, MD  insulin aspart (NOVOLOG FLEXPEN) 100 UNIT/ML FlexPen Inject 2-15 Units into the skin 4 (four) times daily. Sliding scale 121-150 2 units; 151-200 3 units; 201-250 5 units; 251-300 8 units; 301-350 11 units; 351-400 15 units, greater than 400 - 15 units, call doctor.   Yes [provider]  lisinopril (PRINIVIL,ZESTRIL) 40 MG tablet Take 40 mg by mouth daily.  05/09/18  Yes [provider]  metoprolol tartrate (LOPRESSOR) 100 MG tablet Take 1 tablet (100 mg total) by mouth 2 (two) times daily. 04/12/17  Yes Hosie Poisson, MD  pantoprazole (PROTONIX) 40 MG tablet Take 1 tablet (40 mg total) by mouth daily. 12/25/19 12/24/20 Yes Paduchowski, Lennette Bihari, MD  ACCU-CHEK FASTCLIX LANCETS MISC U UTD TO CHECK BLOOD SUGAR TID 01/13/18   [provider]  ACCU-CHEK GUIDE test strip USE TO TEST QID UTD 12/13/17   [provider]  glucose blood (ACCU-CHEK COMPACT PLUS) test strip Use 4 (four) times daily Use  as instructed. 11/13/17   [provider]  insulin glargine (LANTUS) 100 UNIT/ML injection Inject 30 Units into the skin in the morning.     [provider]    Allergies    Patient has no known allergies.  Review of Systems   Review of Systems  Constitutional: Negative for appetite change, chills and fever.  HENT: Negative for ear pain, rhinorrhea, sneezing and sore throat.   Eyes: Negative for photophobia and visual disturbance.  Respiratory: Negative for cough, chest tightness, shortness of breath and wheezing.   Cardiovascular: Negative for chest pain and palpitations.  Gastrointestinal: Positive for abdominal pain. Negative for blood in stool, constipation, diarrhea, nausea and vomiting.  Genitourinary: Negative for dysuria, hematuria and urgency.  Musculoskeletal: Negative for myalgias.  Skin: Negative for rash.  Neurological: Negative for dizziness, weakness and light-headedness.    Physical Exam Updated Vital Signs BP (!) 145/97 (BP Location: Right Arm)   Pulse 63   Temp  97.9 F (36.6 C) (Oral)   Resp 16   SpO2 99%   Physical Exam Vitals and nursing note reviewed.  Constitutional:      General: He is not in acute distress.    Appearance: He is well-developed.  HENT:     Head: Normocephalic and atraumatic.     Nose: Nose normal.  Eyes:     General: No scleral icterus.       Left eye: No discharge.     Conjunctiva/sclera: Conjunctivae normal.  Cardiovascular:     Rate and Rhythm: Normal rate and regular rhythm.     Heart sounds: Normal heart sounds. No murmur heard.  No friction rub. No gallop.   Pulmonary:     Effort: Pulmonary effort is normal. No respiratory distress.     Breath sounds: Normal breath sounds.  Abdominal:     General: Bowel sounds are normal. There is no distension.     Palpations: Abdomen is soft.     Tenderness: There is no abdominal tenderness. There is no guarding.  Musculoskeletal:        General: Normal range of  motion.     Cervical back: Normal range of motion and neck supple.  Skin:    General: Skin is warm and dry.     Findings: No rash.  Neurological:     Mental Status: He is alert.     Motor: No abnormal muscle tone.     Coordination: Coordination normal.     ED Results / Procedures / Treatments   Labs (all labs ordered are listed, but only abnormal results are displayed) Labs Reviewed  COMPREHENSIVE METABOLIC PANEL - Abnormal; Notable for the following components:      Result Value   Glucose, Bld 109 (*)    Creatinine, Ser 1.58 (*)    Total Protein 8.4 (*)    GFR calc non Af Amer 51 (*)    GFR calc Af Amer 59 (*)    All other components within normal limits  URINALYSIS, ROUTINE W REFLEX MICROSCOPIC - Abnormal; Notable for the following components:   Protein, ur 30 (*)    All other components within normal limits  LIPASE, BLOOD  CBC    EKG EKG Interpretation  Date/Time:  Tuesday December 29 2019 02:00:59 EDT Ventricular Rate:  60 PR Interval:    QRS Duration: 94 QT Interval:  486 QTC Calculation: 486 R Axis:   17 Text Interpretation: Sinus rhythm Probable anteroseptal infarct, old 12 Lead; [place] No significant change was found Confirmed by Ezequiel Essex [phone]) on 12/29/2019 2:07:16 AM   Radiology No results found.  Procedures Procedures (including critical care time)  Medications Ordered in ED Medications  sodium chloride flush (NS) 0.9 % injection 3 mL (has no administration in time range)  alum & mag hydroxide-simeth (MAALOX/MYLANTA) 200-200-20 MG/5ML suspension 30 mL (30 mLs Oral Given 12/29/19 5621)    ED Course  I have reviewed the triage vital signs and the nursing notes.  Pertinent labs & imaging results that were available during my care of the patient were reviewed by me and considered in my medical decision making (see chart for details).    MDM Rules/Calculators/A&P                          49 year old male with past medical history of CKD,  hypertension, diabetes, prior DVT not currently on anticoagulant presenting to the ED with a chief complaint of abdominal pain.  Reports epigastric and midabdominal pain worse at work.  Seen evaluated 4 days ago for the symptoms as well as shortness of breath with reassuring work-up including negative CT angiogram.  He was given Protonix which he has been taking.  On exam abdomen is soft, nontender nondistended.  She denies any nausea, vomiting or diarrhea.  He denies any chest pain or shortness of breath today.  EKG here shows sinus rhythm, no changes from prior tracings.  Reviewed visit from 12/25/2019 which showed no abnormal findings and there was concern for gastritis.  CMP showing BUN and creatinine at baseline consistent with his CKD.  CBC and lipase unremarkable.  Patient was given GI cocktail with improvement in his symptoms.  Suspect that his symptoms are due to gastritis.  I doubt appendicitis, cholecystitis, other emergent cause of his symptoms.  He was encouraged to follow-up with gastroenterology which I feel is important.  We will have him continue his home medications and take Maalox as needed. Return precautions given.   Patient is hemodynamically stable, in NAD, and able to ambulate in the ED. Evaluation does not show pathology that would require ongoing emergent intervention or inpatient treatment. I explained the diagnosis to the patient. Pain has been managed and has no complaints prior to discharge. Patient is comfortable with above plan and is stable for discharge at this time. All questions were answered prior to disposition. Strict return precautions for returning to the ED were discussed. Encouraged follow up with PCP.   An After Visit Summary was printed and given to the patient.   Portions of this note were generated with [employer]. Dictation errors may occur despite best attempts at proofreading.  Final Clinical Impression(s) / ED Diagnoses Final diagnoses:    Acute gastritis without hemorrhage, unspecified gastritis type    Rx / DC Orders ED Discharge Orders    None       Delia Heady, PA-C 12/29/19 2426    Lacretia Leigh, MD 12/31/19 [phone]

## 2019-12-29 NOTE — ED Triage Notes (Signed)
Patient arrived stating [facility]. States he is still having abdominal pain despite the protonix prescription. Declines any nausea, vomiting or diarrhea.

## 2020-01-28 ENCOUNTER — Other Ambulatory Visit: Payer: Self-pay

## 2020-01-28 ENCOUNTER — Encounter: Payer: Self-pay | Admitting: Gastroenterology

## 2020-01-28 ENCOUNTER — Ambulatory Visit (INDEPENDENT_AMBULATORY_CARE_PROVIDER_SITE_OTHER): Payer: PRIVATE HEALTH INSURANCE | Admitting: Gastroenterology

## 2020-01-28 VITALS — BP 146/85 | HR 73 | Temp 98.7°F | Ht 70.0 in | Wt 277.0 lb

## 2020-01-28 DIAGNOSIS — G8929 Other chronic pain: Secondary | ICD-10-CM

## 2020-01-28 DIAGNOSIS — R1013 Epigastric pain: Secondary | ICD-10-CM

## 2020-01-28 DIAGNOSIS — E8881 Metabolic syndrome: Secondary | ICD-10-CM | POA: Diagnosis not present

## 2020-01-28 NOTE — Patient Instructions (Addendum)
Gastri emptying study is scheduled for 02/12/2019 [place] [facility]. Nothing to eat or drink after midnight  Hold Pantoprazole and omeprazole 8 hours before procedure.

## 2020-01-28 NOTE — Progress Notes (Signed)
Cephas Darby, MD [address]  [place]  [place], [place] [address]  Main: [phone]  Fax: [phone]    Gastroenterology Consultation  Referring Provider:     Tracie Harrier, MD Primary Care Physician:  Tracie Harrier, MD Primary Gastroenterologist:  Dr. Cephas Darby Reason for Consultation: Upper abdominal pain        HPI:   [name] is a 49 y.o. African-American male referred by Dr. Tracie Harrier, MD  for consultation & management of chronic epigastric pain.patient is to metabolic syndrome, reports that he has been suffering from intermittent episodes of sudden onset of epigastric pain since 2011. He has been diabetic prior to 2011, his initial A1c was 15.5, currently on insulin. He reports episodes of upper abdominal pain to be sudden onset, sharp, worse with standing and feels better by lying down. These episodes occur once every few weeks to 6 months or so. Most recent episode was last week when he went to [facility] ER. His basic labs were unremarkable, ultrasound revealed fatty liver, CT A/P revealed left inguinal hernia. He was discharged on Bentyl and Pepcid. He said he could not afford Bentyl. He did not try Pepcid. He denies any relation to food, not associated with nausea, vomiting, bloating, early satiety. He denies weight loss. Over the years, his A1c has improved but still high. Most recent value is 9.7. He denies smoking or drinking alcohol. He denies any lower GI symptoms   Follow-up visit 05/04/2018 He underwent EGD and colonoscopy.  EGD confirmed H. pylori gastritis status post triple therapy for 2 weeks.  Patient reports that epigastric pain has significantly improved.  Currently, pain is sporadic and mild.  His diabetes is significantly improved, most recent hemoglobin A1c is 7.6 in 02/2018.  He reports that he is watching his diet carefully.  He denies any other GI symptoms.  Follow-up visit 05/23/2018 Patient  went to ER 3 days ago due to worsening of upper abdominal pain for the last week that has gotten worse.  He reports that this pain is similar to the episodes in the past.  He had an ultrasound right upper quadrant which revealed mild fatty liver only, there was no evidence of cholelithiasis, CBD was normal.  I perform H. pylori breath test after treating his H. pylori with triple therapy, which came back negative.  Patient is off omeprazole since completion of treatment for triple therapy. Patient has been gradually gaining weight.  He attributes it to sedentary lifestyle, unable to exercise due to leg pain He denies eating much, denies drinking sodas, red meat, bread or fried foods  Follow-up visit 01/28/2020 Patient reports intermittent episodes of epigastric pain sometimes radiating to right upper quadrant.  He most recently had another episode in June, went to ER Protonix 40 mg twice daily.  He reports that whenever he tries to bend over or stand, he reports onset of pain associated with intense sweating.  He is not able to work because of pain.  The only thing he can do is lay down.  He denies any particular relation to food.  He continues to gain weight.  He is not physically active.  He denies nausea, vomiting abdominal bloating or radiation to the back.  He underwent EGD in the past, treated for H. pylori  NSAIDs: none  Antiplts/Anticoagulants/Anti thrombotics: on rivaroxaban with history of chronic DVT since 03/2017. Patient reports that the repeat ultrasound Doppler was inconclusive, therefore he decided to stay on  the medication, however he is taking only twice weekly.  GI Procedures:  EGD 02/10/2018 - Normal duodenal bulb and second portion of the duodenum. - Scar in the gastric antrum. - Erythematous mucosa in the greater curvature of the gastric body. Biopsied. - Salmon-colored mucosa suspicious for Barrett's esophagus. Biopsied. - Normal esophagus. - Ectopic gastric mucosa in the upper  third of the esophagus.  Colonoscopy 02/10/2018 - One diminutive polyp in the cecum, removed with a cold biopsy forceps. Resected and retrieved. - One 7 mm polyp in the ascending colon, removed with a hot snare. Resected and retrieved. - Three 4 to 5 mm polyps in the descending colon, removed with a cold snare. Resected and retrieved. - Non-bleeding internal hemorrhoids. - Diverticulosis in the ascending colon. He denies family history of GI malignancy  DIAGNOSIS:  A. STOMACH; COLD BIOPSY:  - ANTRAL AND OXYNTIC MUCOSA WITH PATCHY MILD CHRONIC ACTIVE GASTRITIS  WITH RARE HELICOBACTER ORGANISMS IDENTIFIED ON IHC STAIN.  - NEGATIVE FOR DYSPLASIA AND MALIGNANCY.   B. GEJ; COLD BIOPSY:  - PREDOMINANTLY SQUAMOUS MUCOSA WITH FOCAL GASTRIC CARDIAC GLANDULAR  MUCOSA.  - NEGATIVE FOR GOBLET CELLS, DYSPLASIA, AND MALIGNANCY.   C. COLON POLYP, CECUM; COLD BIOPSY:  - COLONIC MUCOSA WITH PROMINENT LYMPHOID AGGREGATE.  - NEGATIVE FOR DYSPLASIA AND MALIGNANCY.   D. COLON POLYP, ASCENDING; HOT SNARE:  - SESSILE SERRATED ADENOMA.  - NEGATIVE FOR HIGH-GRADE DYSPLASIA AND MALIGNANCY.   E. COLON POLYPS X3, DESCENDING; COLD SNARE AND COLD BIOPSY:  - SESSILE SERRATED ADENOMA (2 FRAGMENTS).  - TUBULAR ADENOMA (2 FRAGMENTS).  - NEGATIVE FOR HIGH-GRADE DYSPLASIA AND MALIGNANCY.   Past Medical History:  Diagnosis Date  . Acute blood loss as cause of postoperative anemia 04/18/2017  . ARF (acute renal failure) ([place])   . Cellulitis of right lower leg 03/31/2017  . Chronic anticoagulation 03/31/2017  . Diabetes mellitus without complication ([place])   . Diabetic ulcer of calf ([place]) 03/31/2017  . DVT of proximal lower limb ([name]) 03/20/2017   R calf area  . Escherichia coli sepsis ([place]) 04/18/2017  . Gout   . History of hiatal hernia   . Hypertension   . Hypoalbuminemia 03/31/2017  . Necrotizing fasciitis ([place])   . Right leg DVT ([place]) 03/31/2017  . Sepsis ([place]) 03/30/2017  . Sinus tachycardia 03/31/2017    . Type 2 diabetes mellitus with stage 2 chronic kidney disease, with long-term current use of insulin ([place]) 03/27/2017    Past Surgical History:  Procedure Laterality Date  . COLONOSCOPY WITH PROPOFOL N/A 02/10/2018   Procedure: COLONOSCOPY WITH PROPOFOL;  Surgeon: Lin Landsman, MD;  Location[facility] ENDOSCOPY;  Service: Gastroenterology;  Laterality: N/A;  . ESOPHAGOGASTRODUODENOSCOPY (EGD) WITH PROPOFOL N/A 02/10/2018   Procedure: ESOPHAGOGASTRODUODENOSCOPY (EGD) WITH PROPOFOL;  Surgeon: Lin Landsman, MD;  Location[facility]OSCOPY;  Service: Gastroenterology;  Laterality: N/A;  . I & D EXTREMITY Right 04/03/2017   Procedure: IRRIGATION AND DEBRIDEMENT RIGHT LEG, APPLY WOUND VAC;  Surgeon: Newt Minion, MD;  Location: [place];  Service: Orthopedics;  Laterality: Right;  . I & D EXTREMITY Right 04/05/2017   Procedure: IRRIGATION AND DEBRIDEMENT RIGHT LEG;  Surgeon: Newt Minion, MD;  Location: [place];  Service: Orthopedics;  Laterality: Right;  . IRRIGATION AND DEBRIDEMENT ABSCESS Right 12/30/2018   Procedure: MINOR INCISION AND DRAINAGE OF ABSCESS;  Surgeon: Verner Mould, MD;  Location: [facility] ORS;  Service: Orthopedics;  Laterality: Right;  . SCROTAL SURGERY  2006  . SKIN SPLIT GRAFT  Right 04/10/2017   Procedure: SKIN GRAFT SPLIT THICKNESS RIGHT LEG;  Surgeon: Newt Minion, MD;  Location: [place];  Service: Orthopedics;  Laterality: Right;    Current Outpatient Medications:  .  ACCU-CHEK FASTCLIX LANCETS MISC, U UTD TO CHECK BLOOD SUGAR TID, Disp: , Rfl: 4 .  ACCU-CHEK GUIDE test strip, USE TO TEST QID UTD, Disp: , Rfl: 1 .  aluminum-magnesium hydroxide 200-200 MG/5ML suspension, Take by mouth., Disp: , Rfl:  .  amLODipine (NORVASC) 10 MG tablet, Take 10 mg by mouth daily., Disp: , Rfl:  .  glucose blood (ACCU-CHEK COMPACT PLUS) test strip, Use 4 (four) times daily Use as instructed., Disp: , Rfl:  .  hydrALAZINE (APRESOLINE) 100 MG tablet, Take 100 mg by mouth 3 (three)  times daily., Disp: , Rfl:  .  hydrochlorothiazide (HYDRODIURIL) 25 MG tablet, Take 25 mg by mouth daily. , Disp: , Rfl: 6 .  HYDROcodone-acetaminophen (NORCO/VICODIN) 5-325 MG tablet, Take 1 tablet by mouth every 4 (four) hours as needed., Disp: 8 tablet, Rfl: 0 .  insulin aspart (NOVOLOG FLEXPEN) 100 UNIT/ML FlexPen, Inject 2-15 Units into the skin 4 (four) times daily. Sliding scale 121-150 2 units; 151-200 3 units; 201-250 5 units; 251-300 8 units; 301-350 11 units; 351-400 15 units, greater than 400 - 15 units, call doctor., Disp: , Rfl:  .  insulin glargine (LANTUS) 100 UNIT/ML injection, Inject 30 Units into the skin in the morning. , Disp: , Rfl:  .  insulin lispro (HUMALOG) 100 UNIT/ML KwikPen, INJECT 10 TO 15 UNITS WITH MEALS BASED ON FOOD AND BLOOD SUGAR WITH A MAX DOSE OF 50 UNITS DAILY., Disp: , Rfl:  .  lisinopril (PRINIVIL,ZESTRIL) 40 MG tablet, Take 40 mg by mouth daily. , Disp: , Rfl: 2 .  metoprolol tartrate (LOPRESSOR) 100 MG tablet, Take 1 tablet (100 mg total) by mouth 2 (two) times daily., Disp: , Rfl:  .  omeprazole (PRILOSEC) 40 MG capsule, omeprazole 40 mg capsule,delayed release, Disp: , Rfl:  .  pantoprazole (PROTONIX) 40 MG tablet, Take 1 tablet (40 mg total) by mouth daily., Disp: 30 tablet, Rfl: 1    Family History  Problem Relation Age of Onset  . Hypertension Mother   . Stroke Mother   . Diabetes Father   . Renal Disease Father   . Hypertension Sister   . Diabetes Sister   . Renal Disease Sister   . Iron deficiency Sister      Social History   Tobacco Use  . Smoking status: Never Smoker  . Smokeless tobacco: Never Used  Vaping Use  . Vaping Use: Never used  Substance Use Topics  . Alcohol use: No  . Drug use: No    Allergies as of 01/28/2020  . (No Known Allergies)    Review of Systems:    All systems reviewed and negative except where noted in HPI.   Physical Exam:  BP (!) 146/85 (BP Location: Left Arm, Patient Position: Sitting, Cuff  Size: Normal)   Pulse 73   Temp 98.7 F (37.1 C) (Oral)   Ht 5\' 10"  (1.778 m)   Wt 277 lb (125.6 kg)   BMI 39.75 kg/m  No LMP for male patient.  General:   Alert,  Well-developed, well-nourished, pleasant and cooperative in NAD Head:  Normocephalic and atraumatic. Eyes:  Sclera clear, no icterus.   Conjunctiva pink. Ears:  Normal auditory acuity. Nose:  No deformity, discharge, or lesions. Mouth:  No deformity or lesions,oropharynx pink & moist.  Neck:  Supple; no masses or thyromegaly. Lungs:  Respirations even and unlabored.  Clear throughout to auscultation.   No wheezes, crackles, or rhonchi. No acute distress. Heart:  Regular rate and rhythm; no murmurs, clicks, rubs, or gallops. Abdomen:  Normal bowel sounds. Soft,Obese, without masses, hepatosplenomegaly, left inguinal hernia, nontender.  No guarding or rebound tenderness.   Rectal: Not performed Msk:  Symmetrical without gross deformities. Good, equal movement & strength bilaterally. Pulses:  Normal pulses noted. Extremities:  No clubbing, 1+ edema.  No cyanosis. Neurologic:  Alert and oriented x3;  grossly normal neurologically. Skin:  Intact without significant lesions or rashes. No jaundice. Psych:  Alert and cooperative. Normal mood and affect.  Imaging Studies: reviewed  Assessment and Plan:   [name] is a 49 y.o. African-American male with metabolic syndrome, diabetes mellitus on insulin presents with chronic intermittent episodes of epigastric pain without any other constitutional symptoms.   Chronic epigastric pain Work-up thus far including CBC, LFTs, lipase, ultrasound and CT A/P were fairly unremarkable except for evidence of left inguinal hernia.   EGD revealed H. pylori gastritis, status post triple therapy, H. pylori breath test confirmed eradication.  Given ongoing pain, recommend repeat EGD with biopsies and evaluate for hiatal hernia.  Recommend gastric emptying study to evaluate for  gastroparesis.  If above work-up is unremarkable, will refer to general surgery to evaluate for cholecystectomy  Tubular adenomas of colon and sessile serrated adenoma: Recommend colonoscopy in 3 years  Follow up in 3 months   Cephas Darby, MD

## 2020-02-12 ENCOUNTER — Encounter
Admission: RE | Admit: 2020-02-12 | Discharge: 2020-02-12 | Disposition: A | Discharge status: Home / Self Care | Payer: Self-pay | Source: Ambulatory Visit | Attending: Gastroenterology | Admitting: Gastroenterology

## 2020-02-12 ENCOUNTER — Other Ambulatory Visit: Payer: Self-pay

## 2020-02-12 DIAGNOSIS — R1013 Epigastric pain: Secondary | ICD-10-CM | POA: Insufficient documentation

## 2020-02-12 MED ORDER — TECHNETIUM TC 99M SULFUR COLLOID
2.6300 | Freq: Once | INTRAVENOUS | Status: AC | PRN
  Administered 2020-02-12: 2.63 via INTRAVENOUS

## 2020-02-22 ENCOUNTER — Other Ambulatory Visit: Payer: Self-pay

## 2020-02-22 ENCOUNTER — Other Ambulatory Visit
Admission: RE | Admit: 2020-02-22 | Discharge: 2020-02-22 | Disposition: A | Discharge status: Home / Self Care | Payer: Medicaid Other | Source: Ambulatory Visit | Attending: Gastroenterology | Admitting: Gastroenterology

## 2020-02-22 DIAGNOSIS — Z20822 Contact with and (suspected) exposure to covid-19: Secondary | ICD-10-CM | POA: Insufficient documentation

## 2020-02-22 DIAGNOSIS — Z01812 Encounter for preprocedural laboratory examination: Secondary | ICD-10-CM | POA: Insufficient documentation

## 2020-02-23 LAB — SARS CORONAVIRUS 2 (TAT 6-24 HRS): SARS Coronavirus 2: NEGATIVE

## 2020-02-24 ENCOUNTER — Other Ambulatory Visit: Payer: Self-pay

## 2020-02-24 ENCOUNTER — Encounter
Admission: RE | Disposition: A | Discharge status: Home / Self Care | Payer: Self-pay | Source: Home / Self Care | Attending: Gastroenterology

## 2020-02-24 ENCOUNTER — Ambulatory Visit
Admission: RE | Admit: 2020-02-24 | Discharge: 2020-02-24 | Disposition: A | Discharge status: Home / Self Care | Payer: Self-pay | Source: Home / Self Care | Attending: Gastroenterology | Admitting: Gastroenterology

## 2020-02-24 ENCOUNTER — Ambulatory Visit: Payer: Self-pay | Admitting: Anesthesiology

## 2020-02-24 SURGERY — ESOPHAGOGASTRODUODENOSCOPY (EGD) WITH PROPOFOL
Anesthesia: General

## 2020-02-24 MED ORDER — PROPOFOL 500 MG/50ML IV EMUL
INTRAVENOUS | Status: AC
  Filled 2020-02-24: qty 50

## 2020-02-24 MED ORDER — LIDOCAINE HCL (PF) 2 % IJ SOLN
INTRAMUSCULAR | Status: AC
  Filled 2020-02-24: qty 5

## 2020-02-24 NOTE — Anesthesia Preprocedure Evaluation (Deleted)

## 2020-04-05 ENCOUNTER — Encounter (HOSPITAL_COMMUNITY): Payer: Self-pay | Admitting: Emergency Medicine

## 2020-04-05 ENCOUNTER — Emergency Department (HOSPITAL_COMMUNITY)
Admission: EM | Admit: 2020-04-05 | Discharge: 2020-04-05 | Disposition: A | Discharge status: Home / Self Care | Payer: PRIVATE HEALTH INSURANCE | Attending: Emergency Medicine | Admitting: Emergency Medicine

## 2020-04-05 DIAGNOSIS — K2970 Gastritis, unspecified, without bleeding: Secondary | ICD-10-CM | POA: Insufficient documentation

## 2020-04-05 DIAGNOSIS — I129 Hypertensive chronic kidney disease with stage 1 through stage 4 chronic kidney disease, or unspecified chronic kidney disease: Secondary | ICD-10-CM | POA: Insufficient documentation

## 2020-04-05 DIAGNOSIS — L98499 Non-pressure chronic ulcer of skin of other sites with unspecified severity: Secondary | ICD-10-CM | POA: Insufficient documentation

## 2020-04-05 DIAGNOSIS — X58XXXA Exposure to other specified factors, initial encounter: Secondary | ICD-10-CM | POA: Insufficient documentation

## 2020-04-05 DIAGNOSIS — Z79899 Other long term (current) drug therapy: Secondary | ICD-10-CM | POA: Insufficient documentation

## 2020-04-05 DIAGNOSIS — E1122 Type 2 diabetes mellitus with diabetic chronic kidney disease: Secondary | ICD-10-CM | POA: Insufficient documentation

## 2020-04-05 DIAGNOSIS — Z794 Long term (current) use of insulin: Secondary | ICD-10-CM | POA: Insufficient documentation

## 2020-04-05 DIAGNOSIS — S81801A Unspecified open wound, right lower leg, initial encounter: Secondary | ICD-10-CM | POA: Insufficient documentation

## 2020-04-05 DIAGNOSIS — E11622 Type 2 diabetes mellitus with other skin ulcer: Secondary | ICD-10-CM | POA: Insufficient documentation

## 2020-04-05 DIAGNOSIS — R1013 Epigastric pain: Secondary | ICD-10-CM | POA: Insufficient documentation

## 2020-04-05 DIAGNOSIS — N182 Chronic kidney disease, stage 2 (mild): Secondary | ICD-10-CM | POA: Insufficient documentation

## 2020-04-05 LAB — COMPREHENSIVE METABOLIC PANEL
ALT: 14 U/L (ref 0–44)
AST: 18 U/L (ref 15–41)
Albumin: 3.7 g/dL (ref 3.5–5.0)
Alkaline Phosphatase: 56 U/L (ref 38–126)
Anion gap: 11 (ref 5–15)
BUN: 24 mg/dL — ABNORMAL HIGH (ref 6–20)
CO2: 24 mmol/L (ref 22–32)
Calcium: 9.3 mg/dL (ref 8.9–10.3)
Chloride: 105 mmol/L (ref 98–111)
Creatinine, Ser: 1.57 mg/dL — ABNORMAL HIGH (ref 0.61–1.24)
GFR calc Af Amer: 60 mL/min — ABNORMAL LOW (ref >60)
GFR calc non Af Amer: 51 mL/min — ABNORMAL LOW (ref >60)
Glucose, Bld: 80 mg/dL (ref 70–99)
Potassium: 3.7 mmol/L (ref 3.5–5.1)
Sodium: 140 mmol/L (ref 135–145)
Total Bilirubin: 0.5 mg/dL (ref 0.3–1.2)
Total Protein: 7.2 g/dL (ref 6.5–8.1)

## 2020-04-05 LAB — CBC
HCT: 46.8 % (ref 39.0–52.0)
Hemoglobin: 15 g/dL (ref 13.0–17.0)
MCH: 29.1 pg (ref 26.0–34.0)
MCHC: 32.1 g/dL (ref 30.0–36.0)
MCV: 90.7 fL (ref 80.0–100.0)
Platelets: 326 10*3/uL (ref 150–400)
RBC: 5.16 MIL/uL (ref 4.22–5.81)
RDW: 13.3 % (ref 11.5–15.5)
WBC: 5.6 10*3/uL (ref 4.0–10.5)
nRBC: 0 % (ref 0.0–0.2)

## 2020-04-05 LAB — LIPASE, BLOOD: Lipase: 23 U/L (ref 11–51)

## 2020-04-05 MED ORDER — ALUM & MAG HYDROXIDE-SIMETH 200-200-20 MG/5ML PO SUSP
30.0000 mL | Freq: Once | ORAL | Status: AC
  Administered 2020-04-05: 30 mL via ORAL
  Filled 2020-04-05: qty 30

## 2020-04-05 MED ORDER — PANTOPRAZOLE SODIUM 40 MG PO TBEC
40.0000 mg | DELAYED_RELEASE_TABLET | Freq: Every day | ORAL | 1 refills | Status: DC
Start: 2020-04-05 — End: 2020-06-04

## 2020-04-05 MED ORDER — PANTOPRAZOLE SODIUM 40 MG IV SOLR
40.0000 mg | Freq: Once | INTRAVENOUS | Status: AC
  Administered 2020-04-05: 40 mg via INTRAVENOUS
  Filled 2020-04-05: qty 40

## 2020-04-05 MED ORDER — LIDOCAINE VISCOUS HCL 2 % MT SOLN
15.0000 mL | Freq: Once | OROMUCOSAL | Status: AC
  Administered 2020-04-05: 15 mL via ORAL
  Filled 2020-04-05: qty 15

## 2020-04-05 MED ORDER — FENTANYL CITRATE (PF) 100 MCG/2ML IJ SOLN
50.0000 ug | Freq: Once | INTRAMUSCULAR | Status: AC
  Administered 2020-04-05: 50 ug via INTRAVENOUS
  Filled 2020-04-05: qty 2

## 2020-04-05 NOTE — ED Triage Notes (Signed)
Per EMS, patient from home, c/o LUQ pain today. Denies N/V/D.

## 2020-04-05 NOTE — ED Notes (Signed)
Assumed care of patient at this time, nad noted, sr up x2, bed locked and low, call bell w/I reach.  Will continue to monitor. ° °

## 2020-04-05 NOTE — Discharge Instructions (Signed)
Call make an appointment to follow-up with your gastroenterologist.  Take your medications as directed.  Return here as needed if you have any worsening symptoms.

## 2020-04-05 NOTE — ED Provider Notes (Addendum)
[facility] DEPT Provider Note   CSN: [id number] Arrival date & time: 04/05/20  1754     History Chief Complaint  Patient presents with  . Abdominal Pain    [name] is a 49 y.o. male.  Patient is a 49 year old male who presents with abdominal pain.  He has a history of recurrent gastritis.  He previously was on Protonix but he said he ran out about 2 months ago.  He has some chronic discomfort in his epigastrium but says it got worse today.  He denies any GERD symptoms currently.  No nausea or vomiting.  No change in his bowels.  No fevers.  No urinary changes.  He has been treated for a wound on his right lower leg.  Is being managed by the wound care clinic.  He says it is doing well and he does not report any changes to this.        Past Medical History:  Diagnosis Date  . Acute blood loss as cause of postoperative anemia 04/18/2017  . ARF (acute renal failure) ([place])   . Cellulitis of right lower leg 03/31/2017  . Chronic anticoagulation 03/31/2017  . Diabetes mellitus without complication ([place])   . Diabetic ulcer of calf ([place]) 03/31/2017  . DVT of proximal lower limb ([place]) 03/20/2017   R calf area  . Escherichia coli sepsis ([place]) 04/18/2017  . Gout   . History of hiatal hernia   . Hypertension   . Hypoalbuminemia 03/31/2017  . Necrotizing fasciitis ([place])   . Right leg DVT ([place]) 03/31/2017  . Sepsis ([place]) 03/30/2017  . Sinus tachycardia 03/31/2017  . Type 2 diabetes mellitus with stage 2 chronic kidney disease, with long-term current use of insulin ([place]) 03/27/2017    Patient Active Problem List   Diagnosis Date Noted  . Arthralgia of right elbow 01/13/2019  . Venous stasis ulcer ([place]) 12/31/2018  . Chronic kidney disease (CKD) stage G2/A2, mildly decreased glomerular filtration rate (GFR) between 60-89 mL/min/1.73 square meter and albuminuria creatinine ratio between 30-299 mg/g 12/31/2018  . Septic arthritis of elbow, right ([place])  12/30/2018  . Chronic venous insufficiency 02/06/2018  . Chronic anticoagulation 03/31/2017  . Type 2 diabetes mellitus with stage 2 chronic kidney disease, with long-term current use of insulin ([place]) 03/27/2017  . History of DVT (deep vein thrombosis) 03/27/2017  . Essential hypertension 01/28/2008    Past Surgical History:  Procedure Laterality Date  . COLONOSCOPY WITH PROPOFOL N/A 02/10/2018   Procedure: COLONOSCOPY WITH PROPOFOL;  Surgeon: Lin Landsman, MD;  Location: [facility] ENDOSCOPY;  Service: Gastroenterology;  Laterality: N/A;  . ESOPHAGOGASTRODUODENOSCOPY (EGD) WITH PROPOFOL N/A 02/10/2018   Procedure: ESOPHAGOGASTRODUODENOSCOPY (EGD) WITH PROPOFOL;  Surgeon: Lin Landsman, MD;  Location: [facility] ENDOSCOPY;  Service: Gastroenterology;  Laterality: N/A;  . I & D EXTREMITY Right 04/03/2017   Procedure: IRRIGATION AND DEBRIDEMENT RIGHT LEG, APPLY WOUND VAC;  Surgeon: Newt Minion, MD;  Location: [facility];  Service: Orthopedics;  Laterality: Right;  . I & D EXTREMITY Right 04/05/2017   Procedure: IRRIGATION AND DEBRIDEMENT RIGHT LEG;  Surgeon: Newt Minion, MD;  Location: [facility];  Service: Orthopedics;  Laterality: Right;  . IRRIGATION AND DEBRIDEMENT ABSCESS Right 12/30/2018   Procedure: MINOR INCISION AND DRAINAGE OF ABSCESS;  Surgeon: Verner Mould, MD;  Location: [facility] OR[facility];  Service: Orthopedics;  Laterality: Right;  . SCROTAL SURGERY  2006  . SKIN SPLIT GRAFT Right 04/10/2017   Procedure: SKIN GRAFT SPLIT THICKNESS  RIGHT LEG;  Surgeon: Newt Minion, MD;  Location: [facility];  Service: Orthopedics;  Laterality: Right;       Family History  Problem Relation Age of Onset  . Hypertension Mother   . Stroke Mother   . Diabetes Father   . Renal Disease Father   . Hypertension Sister   . Diabetes Sister   . Renal Disease Sister   . Iron deficiency Sister     Social History   Tobacco Use  . Smoking status: Never Smoker  . Smokeless tobacco: Never Used  Vaping Use    . Vaping Use: Never used  Substance Use Topics  . Alcohol use: No  . Drug use: No    Home Medications Prior to Admission medications   Medication Sig Start Date End Date Taking? Authorizing Provider  ACCU-CHEK FASTCLIX LANCETS MISC U UTD TO CHECK BLOOD SUGAR TID 01/13/18   [provider]  ACCU-CHEK GUIDE test strip USE TO TEST QID UTD 12/13/17   [provider]  aluminum-magnesium hydroxide 200-200 MG/5ML suspension Take by mouth.    [provider]  amLODipine (NORVASC) 10 MG tablet Take 10 mg by mouth daily.    [provider]  glucose blood (ACCU-CHEK COMPACT PLUS) test strip Use 4 (four) times daily Use as instructed. 11/13/17   [provider]  hydrALAZINE (APRESOLINE) 100 MG tablet Take 100 mg by mouth 3 (three) times daily. 12/25/19   [provider]  hydrochlorothiazide (HYDRODIURIL) 25 MG tablet Take 25 mg by mouth daily.  05/08/18   [provider]  HYDROcodone-acetaminophen (NORCO/VICODIN) 5-325 MG tablet Take 1 tablet by mouth every 4 (four) hours as needed. 12/25/19   Harvest Dark, MD  insulin aspart (NOVOLOG FLEXPEN) 100 UNIT/ML FlexPen Inject 2-15 Units into the skin 4 (four) times daily. Sliding scale 121-150 2 units; 151-200 3 units; 201-250 5 units; 251-300 8 units; 301-350 11 units; 351-400 15 units, greater than 400 - 15 units, call doctor.    [provider]  insulin glargine (LANTUS) 100 UNIT/ML injection Inject 30 Units into the skin in the morning.     [provider]  insulin lispro (HUMALOG) 100 UNIT/ML KwikPen INJECT 10 TO 15 UNITS WITH MEALS BASED ON FOOD AND BLOOD SUGAR WITH A MAX DOSE OF 50 UNITS DAILY. 11/06/19   [provider]  lisinopril (PRINIVIL,ZESTRIL) 40 MG tablet Take 40 mg by mouth daily.  05/09/18   [provider]  metoprolol tartrate (LOPRESSOR) 100 MG tablet Take 1 tablet (100 mg total) by mouth 2 (two) times daily. 04/12/17   Hosie Poisson, MD   pantoprazole (PROTONIX) 40 MG tablet Take 1 tablet (40 mg total) by mouth daily. 04/05/20 04/05/21  Malvin Johns, MD    Allergies    Patient has no known allergies.  Review of Systems   Review of Systems  Constitutional: Negative for chills, diaphoresis, fatigue and fever.  HENT: Negative for congestion, rhinorrhea and sneezing.   Eyes: Negative.   Respiratory: Negative for cough, chest tightness and shortness of breath.   Cardiovascular: Negative for chest pain and leg swelling.  Gastrointestinal: Positive for abdominal pain. Negative for blood in stool, diarrhea, nausea and vomiting.  Genitourinary: Negative for difficulty urinating, flank pain, frequency and hematuria.  Musculoskeletal: Negative for arthralgias and back pain.  Skin: Positive for wound (wound on right leg being followed by wound clinic). Negative for rash.  Neurological: Negative for dizziness, speech difficulty, weakness, numbness and headaches.    Physical Exam Updated  Vital Signs BP 113/87   Pulse 67   Temp 99.2 F (37.3 C) (Oral)   Resp 15   Ht 5\' 11"  (1.803 m)   Wt 120.2 kg   SpO2 95%   BMI 36.96 kg/m   Physical Exam Constitutional:      Appearance: He is well-developed.  HENT:     Head: Normocephalic and atraumatic.  Eyes:     Pupils: Pupils are equal, round, and reactive to light.  Cardiovascular:     Rate and Rhythm: Normal rate and regular rhythm.     Heart sounds: Normal heart sounds.  Pulmonary:     Effort: Pulmonary effort is normal. No respiratory distress.     Breath sounds: Normal breath sounds. No wheezing or rales.  Chest:     Chest wall: No tenderness.  Abdominal:     General: Bowel sounds are normal.     Palpations: Abdomen is soft.     Tenderness: There is abdominal tenderness in the epigastric area. There is no guarding or rebound.  Musculoskeletal:        General: Normal range of motion.     Cervical back: Normal range of motion and neck supple.  Lymphadenopathy:      Cervical: No cervical adenopathy.  Skin:    General: Skin is warm and dry.     Findings: No rash.  Neurological:     Mental Status: He is alert and oriented to person, place, and time.     ED Results / Procedures / Treatments   Labs (all labs ordered are listed, but only abnormal results are displayed) Labs Reviewed  COMPREHENSIVE METABOLIC PANEL - Abnormal; Notable for the following components:      Result Value   BUN 24 (*)    Creatinine, Ser 1.57 (*)    GFR calc non Af Amer 51 (*)    GFR calc Af Amer 60 (*)    All other components within normal limits  LIPASE, BLOOD  CBC    EKG EKG Interpretation  Date/Time:  Tuesday April 05 2020 19:34:01 EDT Ventricular Rate:  71 PR Interval:    QRS Duration: 95 QT Interval:  408 QTC Calculation: 444 R Axis:   24 Text Interpretation: Sinus rhythm Borderline low voltage, extremity leads Probable anteroseptal infarct, old Baseline wander in lead(s) II III aVF V1 since last tracing no significant change Confirmed by Malvin Johns [phone]) on 04/05/2020 7:45:15 PM   Radiology No results found.  Procedures Procedures (including critical care time)  Medications Ordered in ED Medications  pantoprazole (PROTONIX) injection 40 mg (40 mg Intravenous Given 04/05/20 1924)  alum & mag hydroxide-simeth (MAALOX/MYLANTA) 200-200-20 MG/5ML suspension 30 mL (30 mLs Oral Given 04/05/20 1924)    And  lidocaine (XYLOCAINE) 2 % viscous mouth solution 15 mL (15 mLs Oral Given 04/05/20 1924)  fentaNYL (SUBLIMAZE) injection 50 mcg (50 mcg Intravenous Given 04/05/20 2123)    ED Course  I have reviewed the triage vital signs and the nursing notes.  Pertinent labs & imaging results that were available during my care of the patient were reviewed by me and considered in my medical decision making (see chart for details).    MDM Rules/Calculators/A&P                          Patient is a 49 year old male who has chronic epigastric pain.  He reports  that he has a flareup of his pain.  He has been out  of his Protonix for about 2 months.  He says he always has this pain but it got worse today.  He says it feels the same as his typical abdominal pain.  His labs are nonconcerning.  His creatinine is similar to baseline values.  There is no suggestions of pancreatitis.  No elevated liver enzymes.  He is followed by a gastroenterologist.  He has had a prior EGD and was supposed to have another EGD last month but he did not go to his appointment.  He was given Protonix and GI cocktail in the ED.  He had minimal improvement in symptoms.  He was given dose of fentanyl.  He again says that his pain is similar to his chronic epigastric pain.  His abdominal exam is benign.  I do not feel that he needs imaging studies at this point.  He was discharged home in good condition.  He was encouraged to follow-up with his gastroenterologist.  He was started back on his Protonix.  Return precautions were given. Final Clinical Impression(s) / ED Diagnoses Final diagnoses:  Epigastric pain    Rx / DC Orders ED Discharge Orders         Ordered    pantoprazole (PROTONIX) 40 MG tablet  Daily        04/05/20 2209           Malvin Johns, MD 04/05/20 2214    Malvin Johns, MD 04/05/20 2216

## 2020-04-07 ENCOUNTER — Other Ambulatory Visit: Payer: Self-pay

## 2020-04-07 ENCOUNTER — Encounter: Payer: Self-pay | Admitting: Gastroenterology

## 2020-04-07 ENCOUNTER — Telehealth: Payer: Self-pay

## 2020-04-07 DIAGNOSIS — R1013 Epigastric pain: Secondary | ICD-10-CM

## 2020-04-07 NOTE — Telephone Encounter (Signed)
Patient called to rescheduled his EGD. Scheduled EGD on 04/14/2020 in [place]. Informed patient he would [facility]. Informed patient that he would need some one to drive him and find out the time the day before procedure. Informed patient that he would have nothing to eat or drink after midnight

## 2020-04-12 ENCOUNTER — Other Ambulatory Visit
Admission: RE | Admit: 2020-04-12 | Discharge: 2020-04-12 | Disposition: A | Discharge status: Home / Self Care | Payer: HRSA Program | Source: Ambulatory Visit | Attending: Gastroenterology | Admitting: Gastroenterology

## 2020-04-12 ENCOUNTER — Other Ambulatory Visit: Payer: Self-pay

## 2020-04-12 DIAGNOSIS — Z01812 Encounter for preprocedural laboratory examination: Secondary | ICD-10-CM | POA: Insufficient documentation

## 2020-04-12 DIAGNOSIS — Z20822 Contact with and (suspected) exposure to covid-19: Secondary | ICD-10-CM | POA: Insufficient documentation

## 2020-04-12 LAB — SARS CORONAVIRUS 2 (TAT 6-24 HRS): SARS Coronavirus 2: NEGATIVE

## 2020-04-13 NOTE — Discharge Instructions (Signed)
General Anesthesia, Adult, Care After This sheet gives you information about how to care for yourself after your procedure. Your health care provider may also give you more specific instructions. If you have problems or questions, contact your health care provider. What can I expect after the procedure? After the procedure, the following side effects are common:  Pain or discomfort at the IV site.  Nausea.  Vomiting.  Sore throat.  Trouble concentrating.  Feeling cold or chills.  Weak or tired.  Sleepiness and fatigue.  Soreness and body aches. These side effects can affect parts of the body that were not involved in surgery. Follow these instructions at home:  For at least 24 hours after the procedure:  Have a responsible adult stay with you. It is important to have someone help care for you until you are awake and alert.  Rest as needed.  Do not: ? Participate in activities in which you could fall or become injured. ? Drive. ? Use heavy machinery. ? Drink alcohol. ? Take sleeping pills or medicines that cause drowsiness. ? Make important decisions or sign legal documents. ? Take care of children on your own. Eating and drinking  Follow any instructions from your health care provider about eating or drinking restrictions.  When you feel hungry, start by eating small amounts of foods that are soft and easy to digest (bland), such as toast. Gradually return to your regular diet.  Drink enough fluid to keep your urine pale yellow.  If you vomit, rehydrate by drinking water, juice, or clear broth. General instructions  If you have sleep apnea, surgery and certain medicines can increase your risk for breathing problems. Follow instructions from your health care provider about wearing your sleep device: ? Anytime you are sleeping, including during daytime naps. ? While taking prescription pain medicines, sleeping medicines, or medicines that make you drowsy.  Return to  your normal activities as told by your health care provider. Ask your health care provider what activities are safe for you.  Take over-the-counter and prescription medicines only as told by your health care provider.  If you smoke, do not smoke without supervision.  Keep all follow-up visits as told by your health care provider. This is important. Contact a health care provider if:  You have nausea or vomiting that does not get better with medicine.  You cannot eat or drink without vomiting.  You have pain that does not get better with medicine.  You are unable to pass urine.  You develop a skin rash.  You have a fever.  You have redness around your IV site that gets worse. Get help right away if:  You have difficulty breathing.  You have chest pain.  You have blood in your urine or stool, or you vomit blood. Summary  After the procedure, it is common to have a sore throat or nausea. It is also common to feel tired.  Have a responsible adult stay with you for the first 24 hours after general anesthesia. It is important to have someone help care for you until you are awake and alert.  When you feel hungry, start by eating small amounts of foods that are soft and easy to digest (bland), such as toast. Gradually return to your regular diet.  Drink enough fluid to keep your urine pale yellow.  Return to your normal activities as told by your health care provider. Ask your health care provider what activities are safe for you. This information is not   intended to replace advice given to you by your health care provider. Make sure you discuss any questions you have with your health care provider. Document Revised: 07/05/2017 Document Reviewed: 02/15/2017 Elsevier Patient Education  2020 Elsevier Inc.  

## 2020-04-14 ENCOUNTER — Ambulatory Visit: Payer: Self-pay | Admitting: Anesthesiology

## 2020-04-14 ENCOUNTER — Encounter
Admission: RE | Disposition: A | Discharge status: Home / Self Care | Payer: Self-pay | Source: Home / Self Care | Attending: Gastroenterology

## 2020-04-14 ENCOUNTER — Other Ambulatory Visit: Payer: Self-pay

## 2020-04-14 ENCOUNTER — Ambulatory Visit
Admission: RE | Admit: 2020-04-14 | Discharge: 2020-04-14 | Disposition: A | Discharge status: Home / Self Care | Payer: Self-pay | Attending: Gastroenterology | Admitting: Gastroenterology

## 2020-04-14 ENCOUNTER — Encounter: Payer: Self-pay | Admitting: Gastroenterology

## 2020-04-14 DIAGNOSIS — Z841 Family history of disorders of kidney and ureter: Secondary | ICD-10-CM | POA: Insufficient documentation

## 2020-04-14 DIAGNOSIS — N182 Chronic kidney disease, stage 2 (mild): Secondary | ICD-10-CM | POA: Insufficient documentation

## 2020-04-14 DIAGNOSIS — G8929 Other chronic pain: Secondary | ICD-10-CM

## 2020-04-14 DIAGNOSIS — K3189 Other diseases of stomach and duodenum: Secondary | ICD-10-CM | POA: Insufficient documentation

## 2020-04-14 DIAGNOSIS — R1013 Epigastric pain: Secondary | ICD-10-CM | POA: Insufficient documentation

## 2020-04-14 DIAGNOSIS — Z7901 Long term (current) use of anticoagulants: Secondary | ICD-10-CM | POA: Insufficient documentation

## 2020-04-14 DIAGNOSIS — Z832 Family history of diseases of the blood and blood-forming organs and certain disorders involving the immune mechanism: Secondary | ICD-10-CM | POA: Insufficient documentation

## 2020-04-14 DIAGNOSIS — Z6836 Body mass index (BMI) 36.0-36.9, adult: Secondary | ICD-10-CM | POA: Insufficient documentation

## 2020-04-14 DIAGNOSIS — E1122 Type 2 diabetes mellitus with diabetic chronic kidney disease: Secondary | ICD-10-CM | POA: Insufficient documentation

## 2020-04-14 DIAGNOSIS — Z833 Family history of diabetes mellitus: Secondary | ICD-10-CM | POA: Insufficient documentation

## 2020-04-14 DIAGNOSIS — I129 Hypertensive chronic kidney disease with stage 1 through stage 4 chronic kidney disease, or unspecified chronic kidney disease: Secondary | ICD-10-CM | POA: Insufficient documentation

## 2020-04-14 DIAGNOSIS — Z794 Long term (current) use of insulin: Secondary | ICD-10-CM | POA: Insufficient documentation

## 2020-04-14 DIAGNOSIS — Z86718 Personal history of other venous thrombosis and embolism: Secondary | ICD-10-CM | POA: Insufficient documentation

## 2020-04-14 DIAGNOSIS — Z823 Family history of stroke: Secondary | ICD-10-CM | POA: Insufficient documentation

## 2020-04-14 DIAGNOSIS — Z8249 Family history of ischemic heart disease and other diseases of the circulatory system: Secondary | ICD-10-CM | POA: Insufficient documentation

## 2020-04-14 DIAGNOSIS — Z79899 Other long term (current) drug therapy: Secondary | ICD-10-CM | POA: Insufficient documentation

## 2020-04-14 HISTORY — PX: ESOPHAGOGASTRODUODENOSCOPY (EGD) WITH PROPOFOL: SHX5813

## 2020-04-14 LAB — GLUCOSE, CAPILLARY
Glucose-Capillary: 56 mg/dL — ABNORMAL LOW (ref 70–99)
Glucose-Capillary: 78 mg/dL (ref 70–99)
Glucose-Capillary: 88 mg/dL (ref 70–99)
Glucose-Capillary: 91 mg/dL (ref 70–99)

## 2020-04-14 SURGERY — ESOPHAGOGASTRODUODENOSCOPY (EGD) WITH PROPOFOL
Anesthesia: General

## 2020-04-14 MED ORDER — LACTATED RINGERS IV SOLN: INTRAVENOUS | Status: DC

## 2020-04-14 MED ORDER — DEXTROSE 50 % IV SOLN
25.0000 mL | Freq: Once | INTRAVENOUS | Status: AC
  Administered 2020-04-14: 25 mL via INTRAVENOUS

## 2020-04-14 MED ORDER — ACETAMINOPHEN 160 MG/5ML PO SOLN: 325.0000 mg | Freq: Once | ORAL | Status: DC

## 2020-04-14 MED ORDER — ACETAMINOPHEN 325 MG PO TABS: 325.0000 mg | ORAL_TABLET | Freq: Once | ORAL | Status: DC

## 2020-04-14 MED ORDER — PROPOFOL 10 MG/ML IV BOLUS
INTRAVENOUS | Status: DC | PRN
  Administered 2020-04-14: 50 mg via INTRAVENOUS
  Administered 2020-04-14: 200 mg via INTRAVENOUS
  Administered 2020-04-14: 50 mg via INTRAVENOUS
  Administered 2020-04-14 (×2): 40 mg via INTRAVENOUS

## 2020-04-14 MED ORDER — LIDOCAINE HCL (CARDIAC) PF 100 MG/5ML IV SOSY
PREFILLED_SYRINGE | INTRAVENOUS | Status: DC | PRN
  Administered 2020-04-14: 30 mg via INTRAVENOUS

## 2020-04-14 MED ORDER — GLYCOPYRROLATE 0.2 MG/ML IJ SOLN
INTRAMUSCULAR | Status: DC | PRN
  Administered 2020-04-14: .1 mg via INTRAVENOUS

## 2020-04-14 SURGICAL SUPPLY — 33 items
BALLN DILATOR 10-12 8 (BALLOONS)
BALLN DILATOR 12-15 8 (BALLOONS)
BALLN DILATOR 15-18 8 (BALLOONS)
BALLN DILATOR CRE 0-12 8 (BALLOONS)
BALLN DILATOR ESOPH 8 10 CRE (MISCELLANEOUS) IMPLANT
BALLOON DILATOR 12-15 8 (BALLOONS) IMPLANT
BALLOON DILATOR 15-18 8 (BALLOONS) IMPLANT
BALLOON DILATOR CRE 0-12 8 (BALLOONS) IMPLANT
BLOCK BITE 60FR ADLT L/F GRN (MISCELLANEOUS) ×2 IMPLANT
CLIP HMST 235XBRD CATH ROT (MISCELLANEOUS) IMPLANT
CLIP RESOLUTION 360 11X235 (MISCELLANEOUS)
ELECT REM PT RETURN 9FT ADLT (ELECTROSURGICAL)
ELECTRODE REM PT RTRN 9FT ADLT (ELECTROSURGICAL) IMPLANT
FCP ESCP3.2XJMB 240X2.8X (MISCELLANEOUS)
FORCEPS BIOP RAD 4 LRG CAP 4 (CUTTING FORCEPS) ×2 IMPLANT
FORCEPS BIOP RJ4 240 W/NDL (MISCELLANEOUS)
FORCEPS ESCP3.2XJMB 240X2.8X (MISCELLANEOUS) IMPLANT
GOWN CVR UNV OPN BCK APRN NK (MISCELLANEOUS) ×2 IMPLANT
GOWN ISOL THUMB LOOP REG UNIV (MISCELLANEOUS) ×4
INJECTOR VARIJECT VIN23 (MISCELLANEOUS) IMPLANT
KIT DEFENDO VALVE AND CONN (KITS) IMPLANT
KIT ENDO PROCEDURE OLY (KITS) ×2 IMPLANT
MANIFOLD NEPTUNE II (INSTRUMENTS) ×2 IMPLANT
MARKER SPOT ENDO TATTOO 5ML (MISCELLANEOUS) IMPLANT
RETRIEVER NET PLAT FOOD (MISCELLANEOUS) IMPLANT
SNARE SHORT THROW 13M SML OVAL (MISCELLANEOUS) IMPLANT
SNARE SHORT THROW 30M LRG OVAL (MISCELLANEOUS) IMPLANT
SPOT EX ENDOSCOPIC TATTOO (MISCELLANEOUS)
SYR INFLATION 60ML (SYRINGE) IMPLANT
TRAP ETRAP POLY (MISCELLANEOUS) IMPLANT
VARIJECT INJECTOR VIN23 (MISCELLANEOUS)
WATER STERILE IRR 250ML POUR (IV SOLUTION) ×2 IMPLANT
WIRE CRE 18-20MM 8CM F G (MISCELLANEOUS) IMPLANT

## 2020-04-14 NOTE — Anesthesia Procedure Notes (Signed)
Date/Time: 04/14/2020 9:01 AM Performed by: Cameron Ali, CRNA Pre-anesthesia Checklist: Patient identified, Emergency Drugs available, Suction available, Timeout performed and Patient being monitored Patient Re-evaluated:Patient Re-evaluated prior to induction Oxygen Delivery Method: Nasal cannula Placement Confirmation: positive ETCO2

## 2020-04-14 NOTE — H&P (Signed)
[name] Darby, MD [address]  [place]  [place], [place] [address]  Main: [phone]  Fax: [phone] Pager: [phone]  Primary Care Physician:  Tracie Harrier, MD Primary Gastroenterologist:  Dr. Cephas Price  Pre-Procedure History & Physical: HPI:  [name] is a 49 y.o. male is here for an endoscopy.   Past Medical History:  Diagnosis Date  . Acute blood loss as cause of postoperative anemia 04/18/2017  . ARF (acute renal failure) ([place])   . Cellulitis of right lower leg 03/31/2017  . Chronic anticoagulation 03/31/2017  . Diabetes mellitus without complication ([place])   . Diabetic ulcer of calf ([place]) 03/31/2017  . DVT of proximal lower limb ([place]) 03/20/2017   R calf area  . Escherichia coli sepsis ([place]) 04/18/2017  . Gout   . History of hiatal hernia   . Hypertension   . Hypoalbuminemia 03/31/2017  . Necrotizing fasciitis ([place])   . Right leg DVT ([place]) 03/31/2017  . Sepsis ([place]) 03/30/2017  . Sinus tachycardia 03/31/2017  . Type 2 diabetes mellitus with stage 2 chronic kidney disease, with long-term current use of insulin ([place]) 03/27/2017    Past Surgical History:  Procedure Laterality Date  . COLONOSCOPY WITH PROPOFOL N/A 02/10/2018   Procedure: COLONOSCOPY WITH PROPOFOL;  Surgeon: Lin Landsman, MD;  Location: [facility] ENDOSCOPY;  Service: Gastroenterology;  Laterality: N/A;  . ESOPHAGOGASTRODUODENOSCOPY (EGD) WITH PROPOFOL N/A 02/10/2018   Procedure: ESOPHAGOGASTRODUODENOSCOPY (EGD) WITH PROPOFOL;  Surgeon: Lin Landsman, MD;  Location: [facility] ENDOSCOPY;  Service: Gastroenterology;  Laterality: N/A;  . I & D EXTREMITY Right 04/03/2017   Procedure: IRRIGATION AND DEBRIDEMENT RIGHT LEG, APPLY WOUND VAC;  Surgeon: Newt Minion, MD;  Location: [facility];  Service: Orthopedics;  Laterality: Right;  . I & D EXTREMITY Right 04/05/2017   Procedure: IRRIGATION AND DEBRIDEMENT RIGHT LEG;  Surgeon: Newt Minion, MD;  Location: [facility];  Service: Orthopedics;   Laterality: Right;  . IRRIGATION AND DEBRIDEMENT ABSCESS Right 12/30/2018   Procedure: MINOR INCISION AND DRAINAGE OF ABSCESS;  Surgeon: Verner Mould, MD;  Location: [facility] ORS;  Service: Orthopedics;  Laterality: Right;  . SCROTAL SURGERY  2006  . SKIN SPLIT GRAFT Right 04/10/2017   Procedure: SKIN GRAFT SPLIT THICKNESS RIGHT LEG;  Surgeon: Newt Minion, MD;  Location: [facility];  Service: Orthopedics;  Laterality: Right;    Prior to Admission medications   Medication Sig Start Date End Date Taking? Authorizing Provider  amLODipine (NORVASC) 10 MG tablet Take 10 mg by mouth daily.   Yes [provider]  hydrALAZINE (APRESOLINE) 100 MG tablet Take 100 mg by mouth 3 (three) times daily. 12/25/19  Yes [provider]  hydrochlorothiazide (HYDRODIURIL) 25 MG tablet Take 25 mg by mouth daily.  05/08/18  Yes [provider]  insulin aspart (NOVOLOG FLEXPEN) 100 UNIT/ML FlexPen Inject 2-15 Units into the skin 4 (four) times daily. Sliding scale 121-150 2 units; 151-200 3 units; 201-250 5 units; 251-300 8 units; 301-350 11 units; 351-400 15 units, greater than 400 - 15 units, call doctor.   Yes [provider]  insulin glargine (LANTUS) 100 UNIT/ML injection Inject 30 Units into the skin in the morning.    Yes [provider]  lisinopril (PRINIVIL,ZESTRIL) 40 MG tablet Take 40 mg by mouth daily.  05/09/18  Yes [provider]  metoprolol tartrate (LOPRESSOR) 100 MG tablet Take 1 tablet (100 mg total) by mouth 2 (two) times daily. 04/12/17  Yes Karleen Hampshire,  Jeoffrey Massed, MD  ACCU-CHEK FASTCLIX LANCETS MISC U UTD TO CHECK BLOOD SUGAR TID 01/13/18   [provider]  ACCU-CHEK GUIDE test strip USE TO TEST QID UTD 12/13/17   [provider]  aluminum-magnesium hydroxide 200-200 MG/5ML suspension Take by mouth.    [provider]  glucose blood (ACCU-CHEK COMPACT PLUS) test strip Use 4 (four) times daily Use as instructed. 11/13/17   [provider]  HYDROcodone-acetaminophen (NORCO/VICODIN) 5-325 MG tablet Take 1 tablet by mouth every 4 (four) hours as needed. Patient not taking: Reported on 04/07/2020 12/25/19   Harvest Dark, MD  insulin lispro (HUMALOG) 100 UNIT/ML KwikPen INJECT 10 TO 15 UNITS WITH MEALS BASED ON FOOD AND BLOOD SUGAR WITH A MAX DOSE OF 50 UNITS DAILY. Patient not taking: INJECT 10 TO 15 UNITS WITH MEALS BASED ON FOOD AND BLOOD SUGAR WITH A MAX DOSE OF 50 UNITS DAILY. 11/06/19   [provider]  pantoprazole (PROTONIX) 40 MG tablet Take 1 tablet (40 mg total) by mouth daily. Patient not taking: Reported on 04/07/2020 04/05/20 04/05/21  Malvin Johns, MD    Allergies as of 04/07/2020  . (No Known Allergies)    Family History  Problem Relation Age of Onset  . Hypertension Mother   . Stroke Mother   . Diabetes Father   . Renal Disease Father   . Hypertension Sister   . Diabetes Sister   . Renal Disease Sister   . Iron deficiency Sister     Social History   Socioeconomic History  . Marital status: Single    Spouse name: Not on file  . Number of children: Not on file  . Years of education: Not on file  . Highest education level: Not on file  Occupational History  . Not on file  Tobacco Use  . Smoking status: Never Smoker  . Smokeless tobacco: Never Used  Vaping Use  . Vaping Use: Never used  Substance and Sexual Activity  . Alcohol use: No  . Drug use: No  . Sexual activity: Never  Other Topics Concern  . Not on file  Social History Narrative  . Not on file   Social Determinants of Health   Financial Resource Strain:   . Difficulty of Paying Living Expenses: Not on file  Food Insecurity:   . Worried About Charity fundraiser in the Last Year: Not on file  . Ran Out of Food in the Last Year: Not on file  Transportation Needs:   . Lack of Transportation (Medical): Not on file  . Lack of Transportation (Non-Medical): Not on file  Physical Activity:   . Days of  Exercise per Week: Not on file  . Minutes of Exercise per Session: Not on file  Stress:   . Feeling of Stress : Not on file  Social Connections:   . Frequency of Communication with Friends and Family: Not on file  . Frequency of Social Gatherings with Friends and Family: Not on file  . Attends Religious Services: Not on file  . Active Member of Clubs or Organizations: Not on file  . Attends Archivist Meetings: Not on file  . Marital Status: Not on file  Intimate Partner Violence:   . Fear of Current or Ex-Partner: Not on file  . Emotionally Abused: Not on file  . Physically Abused: Not on file  . Sexually Abused: Not on file    Review of Systems: See HPI, otherwise negative ROS  Physical Exam: BP Marland Kitchen)  142/93   Pulse 72   Temp (!) 97.4 F (36.3 C) (Temporal)   Ht 5\' 11"  (1.803 m)   Wt 118.4 kg   SpO2 100%   BMI 36.40 kg/m  General:   Alert,  pleasant and cooperative in NAD Head:  Normocephalic and atraumatic. Neck:  Supple; no masses or thyromegaly. Lungs:  Clear throughout to auscultation.    Heart:  Regular rate and rhythm. Abdomen:  Soft, nontender and nondistended. Normal bowel sounds, without guarding, and without rebound.   Neurologic:  Alert and  oriented x4;  grossly normal neurologically.  Impression/Plan: Roland K Fralix is here for an endoscopy to be performed for chronic epigastric pain  Risks, benefits, limitations, and alternatives regarding  endoscopy have been reviewed with the patient.  Questions have been answered.  All parties agreeable.   Sherri Sear, MD  04/14/2020, 9:02 AM

## 2020-04-14 NOTE — Anesthesia Preprocedure Evaluation (Signed)
Anesthesia Evaluation  Patient identified by MRN, date of birth, ID band Patient awake    Reviewed: Allergy & Precautions, H&P , NPO status , Patient's Chart, lab work & pertinent test results  Airway Mallampati: II  TM Distance: >3 FB Neck ROM: full    Dental no notable dental hx.    Pulmonary    Pulmonary exam normal breath sounds clear to auscultation       Cardiovascular hypertension, + DVT  Normal cardiovascular exam Rhythm:regular Rate:Normal     Neuro/Psych    GI/Hepatic   Endo/Other  diabetes, Type 2, Insulin DependentMorbid obesity  Renal/GU      Musculoskeletal   Abdominal   Peds  Hematology   Anesthesia Other Findings   Reproductive/Obstetrics                             Anesthesia Physical Anesthesia Plan  ASA: III  Anesthesia Plan: General   Post-op Pain Management:    Induction: Intravenous  PONV Risk Score and Plan: 2 and Treatment may vary due to age or medical condition, TIVA and Propofol infusion  Airway Management Planned: Natural Airway  Additional Equipment:   Intra-op Plan:   Post-operative Plan:   Informed Consent: I have reviewed the patients History and Physical, chart, labs and discussed the procedure including the risks, benefits and alternatives for the proposed anesthesia with the patient or authorized representative who has indicated his/her understanding and acceptance.     Dental Advisory Given  Plan Discussed with: CRNA  Anesthesia Plan Comments:         Anesthesia Quick Evaluation

## 2020-04-14 NOTE — Op Note (Signed)
[facility] Gastroenterology Patient Name: [name] Procedure Date: 04/14/2020 8:54 AM MRN: [id number] Account #: [id number] Date of Birth: 03/18/1971 Admit Type: Outpatient Age: 49 Room: [facility] OR ROOM 01 Gender: Male Note Status: Finalized Procedure:             Upper GI endoscopy Indications:           Epigastric abdominal pain Providers:             Lin Landsman MD, MD Referring MD:          Tracie Harrier, MD (Referring MD) Medicines:             Monitored Anesthesia Care Complications:         No immediate complications. Estimated blood loss: None. Procedure:             Pre-Anesthesia Assessment:                        - Prior to the procedure, a History and Physical was                         performed, and patient medications and allergies were                         reviewed. The patient is competent. The risks and                         benefits of the procedure and the sedation options and                         risks were discussed with the patient. All questions                         were answered and informed consent was obtained.                         Patient identification and proposed procedure were                         verified by the physician, the nurse, the                         anesthesiologist, the anesthetist and the technician                         in the pre-procedure area in the procedure room in the                         endoscopy suite. Mental Status Examination: alert and                         oriented. Airway Examination: normal oropharyngeal                         airway and neck mobility. Respiratory Examination:                         clear to auscultation. CV Examination: normal.  Prophylactic Antibiotics: The patient does not require                         prophylactic antibiotics. Prior Anticoagulants: The                         patient has taken no previous  anticoagulant or                         antiplatelet agents. ASA Grade Assessment: III - A                         patient with severe systemic disease. After reviewing                         the risks and benefits, the patient was deemed in                         satisfactory condition to undergo the procedure. The                         anesthesia plan was to use monitored anesthesia care                         (MAC). Immediately prior to administration of                         medications, the patient was re-assessed for adequacy                         to receive sedatives. The heart rate, respiratory                         rate, oxygen saturations, blood pressure, adequacy of                         pulmonary ventilation, and response to care were                         monitored throughout the procedure. The physical                         status of the patient was re-assessed after the                         procedure.                        After obtaining informed consent, the endoscope was                         passed under direct vision. Throughout the procedure,                         the patient's blood pressure, pulse, and oxygen                         saturations were monitored continuously. The  Endosonoscope was introduced through the mouth, and                         advanced to the second part of duodenum. The upper GI                         endoscopy was accomplished without difficulty. The                         patient tolerated the procedure well. Findings:      The duodenal bulb and second portion of the duodenum were normal.      A single umbilicated lesion measuring 10 mm in diameter was found in the       gastric antrum. Biopsies were taken with a cold forceps for histology.      The entire examined stomach was normal. Biopsies were taken with a cold       forceps for Helicobacter pylori testing.      The cardia and  gastric fundus were normal on retroflexion.      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 Impression:            - Normal duodenal bulb and second portion of the                         duodenum.                        - A single lesion suspicious for aberrant pancreas was                         found in the stomach. Biopsied.                        - Normal stomach. Biopsied.                        - Ectopic gastric mucosa in the upper third of the                         esophagus. Recommendation:        - Discharge patient to home (with escort).                        - Resume previous diet today.                        - Continue present medications.                        - Await pathology results. Procedure Code(s):     --- Professional ---                        [phone], Esophagogastroduodenoscopy, flexible,                         transoral; with biopsy, single or multiple Diagnosis Code(s):     --- Professional ---  K31.89, Other diseases of stomach and duodenum                        R10.13, Epigastric pain CPT copyright 2019 American Medical Association. All rights reserved. The codes documented in this report are preliminary and upon coder review may  be revised to meet current compliance requirements. Dr. Ulyess Mort Lin Landsman MD, MD 04/14/2020 9:23:25 AM This report has been signed electronically. Number of Addenda: 0 Note Initiated On: 04/14/2020 8:54 AM Total Procedure Duration: 0 hours 10 minutes 11 seconds  Estimated Blood Loss:  Estimated blood loss: none.      [facility]

## 2020-04-14 NOTE — Transfer of Care (Signed)
Immediate Anesthesia Transfer of Care Note  Patient: [name]  Procedure(s) Performed: ESOPHAGOGASTRODUODENOSCOPY (EGD) WITH PROPOFOL (N/A )  Patient Location: PACU  Anesthesia Type: General  Level of Consciousness: awake, alert  and patient cooperative  Airway and Oxygen Therapy: Patient Spontanous Breathing and Patient connected to supplemental oxygen  Post-op Assessment: Post-op Vital signs reviewed, Patient's Cardiovascular Status Stable, Respiratory Function Stable, Patent Airway and No signs of Nausea or vomiting  Post-op Vital Signs: Reviewed and stable  Complications: No complications documented.

## 2020-04-14 NOTE — Anesthesia Postprocedure Evaluation (Signed)
Anesthesia Post Note  Patient: Darwyn K Lezama  Procedure(s) Performed: ESOPHAGOGASTRODUODENOSCOPY (EGD) WITH PROPOFOL (N/A )     Patient location during evaluation: PACU Anesthesia Type: General Level of consciousness: awake and alert and oriented Pain management: satisfactory to patient Vital Signs Assessment: post-procedure vital signs reviewed and stable Respiratory status: spontaneous breathing, nonlabored ventilation and respiratory function stable Cardiovascular status: blood pressure returned to baseline and stable Postop Assessment: Adequate PO intake and No signs of nausea or vomiting Anesthetic complications: no   No complications documented.  Raliegh Ip

## 2020-04-18 LAB — SURGICAL PATHOLOGY

## 2020-04-29 ENCOUNTER — Ambulatory Visit: Payer: Self-pay | Admitting: Gastroenterology

## 2020-04-29 ENCOUNTER — Other Ambulatory Visit: Payer: Self-pay

## 2020-04-29 ENCOUNTER — Encounter: Payer: Self-pay | Admitting: Gastroenterology

## 2020-04-29 VITALS — BP 153/63 | HR 76 | Temp 98.5°F | Ht 70.0 in | Wt 264.4 lb

## 2020-04-29 DIAGNOSIS — R1012 Left upper quadrant pain: Secondary | ICD-10-CM

## 2020-04-29 DIAGNOSIS — R1010 Upper abdominal pain, unspecified: Secondary | ICD-10-CM

## 2020-04-29 DIAGNOSIS — G8929 Other chronic pain: Secondary | ICD-10-CM

## 2020-04-29 NOTE — Progress Notes (Addendum)
[name] Darby, MD [address]  [place]  [place], [place] [address]  Main: [phone]  Fax: [phone]    Gastroenterology Consultation  Referring Provider:     Tracie Harrier, MD Primary Care Physician:  [name] Harrier, MD Primary Gastroenterologist:  Dr. Cephas Price Reason for Consultation: Upper abdominal pain        HPI:   [name] is a 49 y.o. African-American male referred by Dr. Tracie Harrier, MD  for consultation & management of chronic epigastric pain.patient is to metabolic syndrome, reports that he has been suffering from intermittent episodes of sudden onset of epigastric pain since 2011. He has been diabetic prior to 2011, his initial A1c was 15.5, currently on insulin. He reports episodes of upper abdominal pain to be sudden onset, sharp, worse with standing and feels better by lying down. These episodes occur once every few weeks to 6 months or so. Most recent episode was last week when he went to [facility] ER. His basic labs were unremarkable, ultrasound revealed fatty liver, CT A/P revealed left inguinal hernia. He was discharged on Bentyl and Pepcid. He said he could not afford Bentyl. He did not try Pepcid. He denies any relation to food, not associated with nausea, vomiting, bloating, early satiety. He denies weight loss. Over the years, his A1c has improved but still high. Most recent value is 9.7. He denies smoking or drinking alcohol. He denies any lower GI symptoms   Follow-up visit 05/04/2018 He underwent EGD and colonoscopy.  EGD confirmed H. pylori gastritis status post triple therapy for 2 weeks.  Patient reports that epigastric pain has significantly improved.  Currently, pain is sporadic and mild.  His diabetes is significantly improved, most recent hemoglobin A1c is 7.6 in 02/2018.  He reports that he is watching his diet carefully.  He denies any other GI symptoms.  Follow-up visit 05/23/2018 Patient  went to ER 3 days ago due to worsening of upper abdominal pain for the last week that has gotten worse.  He reports that this pain is similar to the episodes in the past.  He had an ultrasound right upper quadrant which revealed mild fatty liver only, there was no evidence of cholelithiasis, CBD was normal.  I perform H. pylori breath test after treating his H. pylori with triple therapy, which came back negative.  Patient is off omeprazole since completion of treatment for triple therapy. Patient has been gradually gaining weight.  He attributes it to sedentary lifestyle, unable to exercise due to leg pain He denies eating much, denies drinking sodas, red meat, bread or fried foods  Follow-up visit 01/28/2020 Patient reports intermittent episodes of epigastric pain sometimes radiating to right upper quadrant.  He most recently had another episode in June, went to ER Protonix 40 mg twice daily.  He reports that whenever he tries to bend over or stand, he reports onset of pain associated with intense sweating.  He is not able to work because of pain.  The only thing he can do is lay down.  He denies any particular relation to food.  He continues to gain weight.  He is not physically active.  He denies nausea, vomiting abdominal bloating or radiation to the back.  He underwent EGD in the past, treated for H. Pylori  Follow-up visit 04/29/2020 Patient underwent repeat upper endoscopy for chronic left upper quadrant pain, it was unremarkable.  He underwent gastric emptying study which was normal.  He reports  constant left upper quadrant pain below the subcostal margin, worse moving around.  Does not get worse after eating.  The pain radiates across the upper abdomen.  He underwent extensive work-up so far which has been unremarkable.  Patient was in fact upset today that he had to pay $50 co-pay for the office visit today.  When I interviewed the patient today he did not make any eye contact during the entire  visit.  He lost about 7 pounds since last visit. NSAIDs: none  Antiplts/Anticoagulants/Anti thrombotics: on rivaroxaban with history of chronic DVT since 03/2017. Patient reports that the repeat ultrasound Doppler was inconclusive, therefore he decided to stay on the medication, however he is taking only twice weekly.  GI Procedures:  Upper endoscopy 04/14/2020 - Normal duodenal bulb and second portion of the duodenum. - A single lesion suspicious for aberrant pancreas was found in the stomach. Biopsied. - Normal stomach. Biopsied. - Ectopic gastric mucosa in the upper third of the esophagus.  DIAGNOSIS:  A. STOMACH; COLD BIOPSY:  - MINUTE FOCUS OF GOBLET CELL INTESTINAL METAPLASIA.  - REACTIVE GASTROPATHY.  - NEGATIVE FOR ACTIVE INFLAMMATION AND H PYLORI.  - NEGATIVE FOR DYSPLASIA AND MALIGNANCY.   B. STOMACH, RANDOM; COLD BIOPSY:  - ANTRAL MUCOSA WITH REACTIVE FOVEOLAR HYPERPLASIA.  - OXYNTIC MUCOSA WITH NO SIGNIFICANT PATHOLOGIC ALTERATION.  - NEGATIVE FOR ACTIVE INFLAMMATION AND H PYLORI.  - NEGATIVE FOR DYSPLASIA AND MALIGNANCY.  [name] she needed a work note  EGD 02/10/2018 - Normal duodenal bulb and second portion of the duodenum. - Scar in the gastric antrum. - Erythematous mucosa in the greater curvature of the gastric body. Biopsied. - Salmon-colored mucosa suspicious for Barrett's esophagus. Biopsied. - Normal esophagus. - Ectopic gastric mucosa in the upper third of the esophagus.  Colonoscopy 02/10/2018 - One diminutive polyp in the cecum, removed with a cold biopsy forceps. Resected and retrieved. - One 7 mm polyp in the ascending colon, removed with a hot snare. Resected and retrieved. - Three 4 to 5 mm polyps in the descending colon, removed with a cold snare. Resected and retrieved. - Non-bleeding internal hemorrhoids. - Diverticulosis in the ascending colon. He denies family history of GI malignancy  DIAGNOSIS:  A. STOMACH; COLD BIOPSY:  - ANTRAL AND OXYNTIC  MUCOSA WITH PATCHY MILD CHRONIC ACTIVE GASTRITIS  WITH RARE HELICOBACTER ORGANISMS IDENTIFIED ON IHC STAIN.  - NEGATIVE FOR DYSPLASIA AND MALIGNANCY.   B. GEJ; COLD BIOPSY:  - PREDOMINANTLY SQUAMOUS MUCOSA WITH FOCAL GASTRIC CARDIAC GLANDULAR  MUCOSA.  - NEGATIVE FOR GOBLET CELLS, DYSPLASIA, AND MALIGNANCY.   C. COLON POLYP, CECUM; COLD BIOPSY:  - COLONIC MUCOSA WITH PROMINENT LYMPHOID AGGREGATE.  - NEGATIVE FOR DYSPLASIA AND MALIGNANCY.   D. COLON POLYP, ASCENDING; HOT SNARE:  - SESSILE SERRATED ADENOMA.  - NEGATIVE FOR HIGH-GRADE DYSPLASIA AND MALIGNANCY.   E. COLON POLYPS X3, DESCENDING; COLD SNARE AND COLD BIOPSY:  - SESSILE SERRATED ADENOMA (2 FRAGMENTS).  - TUBULAR ADENOMA (2 FRAGMENTS).  - NEGATIVE FOR HIGH-GRADE DYSPLASIA AND MALIGNANCY.   Past Medical History:  Diagnosis Date  . Acute blood loss as cause of postoperative anemia 04/18/2017  . ARF (acute renal failure) ([place])   . Cellulitis of right lower leg 03/31/2017  . Chronic anticoagulation 03/31/2017  . Diabetes mellitus without complication ([place])   . Diabetic ulcer of calf ([place]) 03/31/2017  . DVT of proximal lower limb ([place]) 03/20/2017   R calf area  . Escherichia coli sepsis ([place]) 04/18/2017  . Gout   .  History of hiatal hernia   . Hypertension   . Hypoalbuminemia 03/31/2017  . Necrotizing fasciitis ([place])   . Right leg DVT ([place]) 03/31/2017  . Sepsis ([place]) 03/30/2017  . Sinus tachycardia 03/31/2017  . Type 2 diabetes mellitus with stage 2 chronic kidney disease, with long-term current use of insulin ([place]) 03/27/2017    Past Surgical History:  Procedure Laterality Date  . COLONOSCOPY WITH PROPOFOL N/A 02/10/2018   Procedure: COLONOSCOPY WITH PROPOFOL;  Surgeon: Lin Landsman, MD;  Location[facility] ENDOSCOPY;  Service: Gastroenterology;  Laterality: N/A;  . ESOPHAGOGASTRODUODENOSCOPY (EGD) WITH PROPOFOL N/A 02/10/2018   Procedure: ESOPHAGOGASTRODUODENOSCOPY (EGD) WITH PROPOFOL;  Surgeon: Lin Landsman,  MD;  Location: [facility] ENDOSCOPY;  Service: Gastroenterology;  Laterality: N/A;  . ESOPHAGOGASTRODUODENOSCOPY (EGD) WITH PROPOFOL N/A 04/14/2020   Procedure: ESOPHAGOGASTRODUODENOSCOPY (EGD) WITH PROPOFOL;  Surgeon: Lin Landsman, MD;  Location: [place];  Service: Endoscopy;  Laterality: N/A;  diabetic - insulin  . I & D EXTREMITY Right 04/03/2017   Procedure: IRRIGATION AND DEBRIDEMENT RIGHT LEG, APPLY WOUND VAC;  Surgeon: Newt Minion, MD;  Location: [place];  Service: Orthopedics;  Laterality: Right;  . I & D EXTREMITY Right 04/05/2017   Procedure: IRRIGATION AND DEBRIDEMENT RIGHT LEG;  Surgeon: Newt Minion, MD;  Location: [place];  Service: Orthopedics;  Laterality: Right;  . IRRIGATION AND DEBRIDEMENT ABSCESS Right 12/30/2018   Procedure: MINOR INCISION AND DRAINAGE OF ABSCESS;  Surgeon: Verner Mould, MD;  Location: [facility];  Service: Orthopedics;  Laterality: Right;  . SCROTAL SURGERY  2006  . SKIN SPLIT GRAFT Right 04/10/2017   Procedure: SKIN GRAFT SPLIT THICKNESS RIGHT LEG;  Surgeon: Newt Minion, MD;  Location: [place];  Service: Orthopedics;  Laterality: Right;    Current Outpatient Medications:  .  ACCU-CHEK FASTCLIX LANCETS MISC, U UTD TO CHECK BLOOD SUGAR TID, Disp: , Rfl: 4 .  ACCU-CHEK GUIDE test strip, USE TO TEST QID UTD, Disp: , Rfl: 1 .  aluminum-magnesium hydroxide 200-200 MG/5ML suspension, Take by mouth., Disp: , Rfl:  .  amLODipine (NORVASC) 10 MG tablet, Take 10 mg by mouth daily., Disp: , Rfl:  .  glucose blood (ACCU-CHEK COMPACT PLUS) test strip, Use 4 (four) times daily Use as instructed., Disp: , Rfl:  .  hydrALAZINE (APRESOLINE) 100 MG tablet, Take 100 mg by mouth 3 (three) times daily., Disp: , Rfl:  .  hydrochlorothiazide (HYDRODIURIL) 25 MG tablet, Take 25 mg by mouth daily. , Disp: , Rfl: 6 .  insulin aspart (NOVOLOG FLEXPEN) 100 UNIT/ML FlexPen, Inject 2-15 Units into the skin 4 (four) times daily. Sliding scale 121-150 2 units; 151-200 3  units; 201-250 5 units; 251-300 8 units; 301-350 11 units; 351-400 15 units, greater than 400 - 15 units, call doctor., Disp: , Rfl:  .  insulin glargine (LANTUS) 100 UNIT/ML injection, Inject 30 Units into the skin in the morning. , Disp: , Rfl:  .  insulin lispro (HUMALOG) 100 UNIT/ML KwikPen, INJECT 10 TO 15 UNITS WITH MEALS BASED ON FOOD AND BLOOD SUGAR WITH A MAX DOSE OF 50 UNITS DAILY., Disp: , Rfl:  .  lisinopril (PRINIVIL,ZESTRIL) 40 MG tablet, Take 40 mg by mouth daily. , Disp: , Rfl: 2 .  metoprolol tartrate (LOPRESSOR) 100 MG tablet, Take 1 tablet (100 mg total) by mouth 2 (two) times daily., Disp: , Rfl:  .  pantoprazole (PROTONIX) 40 MG tablet, Take 1 tablet (40 mg total) by mouth daily., Disp: 30 tablet, Rfl:  1    Family History  Problem Relation Age of Onset  . Hypertension Mother   . Stroke Mother   . Diabetes Father   . Renal Disease Father   . Hypertension Sister   . Diabetes Sister   . Renal Disease Sister   . Iron deficiency Sister      Social History   Tobacco Use  . Smoking status: Never Smoker  . Smokeless tobacco: Never Used  Vaping Use  . Vaping Use: Never used  Substance Use Topics  . Alcohol use: No  . Drug use: No    Allergies as of 04/29/2020  . (No Known Allergies)    Review of Systems:    All systems reviewed and negative except where noted in HPI.   Physical Exam:  BP (!) 153/63 (BP Location: Left Arm, Patient Position: Sitting, Cuff Size: Normal)   Pulse 76   Temp 98.5 F (36.9 C) (Oral)   Ht 5\' 10"  (1.778 m)   Wt 264 lb 6 oz (119.9 kg)   BMI 37.93 kg/m  No LMP for male patient.  General:   Alert,  Well-developed, well-nourished, pleasant and cooperative in NAD Head:  Normocephalic and atraumatic. Eyes:  Sclera clear, no icterus.   Conjunctiva pink. Ears:  Normal auditory acuity. Nose:  No deformity, discharge, or lesions. Mouth:  No deformity or lesions,oropharynx pink & moist. Neck:  Supple; no masses or thyromegaly. Lungs:   Respirations even and unlabored.  Clear throughout to auscultation.   No wheezes, crackles, or rhonchi. No acute distress. Heart:  Regular rate and rhythm; no murmurs, clicks, rubs, or gallops. Abdomen:  Normal bowel sounds.  Tenderness in the left subcostal margin on deep palpation only, soft,Obese, without masses, hepatosplenomegaly, left inguinal hernia, nontender.  No guarding or rebound tenderness.   Rectal: Not performed Msk:  Symmetrical without gross deformities. Good, equal movement & strength bilaterally. Pulses:  Normal pulses noted. Extremities:  No clubbing, 1+ edema.  No cyanosis. Neurologic:  Alert and oriented x3;  grossly normal neurologically. Skin:  Intact without significant lesions or rashes. No jaundice. Psych:  Alert and cooperative. Normal mood and affect.  Imaging Studies: reviewed  Assessment and Plan:   [name] is a 49 y.o. African-American male with metabolic syndrome, diabetes mellitus on insulin presents with chronic left upper quadrant/epigastric pain without any other constitutional symptoms.   Chronic left upper quadrant/epigastric pain Work-up thus far including CBC, LFTs, lipase, ultrasound and CT A/P were fairly unremarkable except for evidence of left inguinal hernia.   EGD revealed H. pylori gastritis, status post triple therapy, H. pylori breath test confirmed eradication, repeat EGD with biopsies were unremarkable, gastric emptying study was negative for gastroparesis.  Based on further discussion and examination today, I do not suspect pain is secondary to gallbladder etiology.  The origin of pain is in fact in the left upper quadrant area.  I suspect pain is either superficial like a trigger point or costochondritis or musculoskeletal.  I advised him to see pain medicine specialist for lidocaine injection of the trigger point.  I also offered him referral to GI for a second opinion to [facility] or [facility], he was not interested. I have advised him to  try extra strength Tylenol or short course of NSAID for possible costochondritis and he said none of these worked.  He reported trial of hydrocodone which did not help as well At the end of visit, patient instead of checking out, walked out of the clinic  Tubular adenomas of colon and sessile serrated adenoma: Recommend colonoscopy in 01/2021  Follow up as needed   [name] Darby, MD

## 2020-06-04 ENCOUNTER — Emergency Department
Admission: EM | Admit: 2020-06-04 | Discharge: 2020-06-04 | Disposition: A | Discharge status: Home / Self Care | Payer: Medicaid Other | Attending: Emergency Medicine | Admitting: Emergency Medicine

## 2020-06-04 ENCOUNTER — Other Ambulatory Visit: Payer: Self-pay

## 2020-06-04 DIAGNOSIS — Z794 Long term (current) use of insulin: Secondary | ICD-10-CM | POA: Insufficient documentation

## 2020-06-04 DIAGNOSIS — I129 Hypertensive chronic kidney disease with stage 1 through stage 4 chronic kidney disease, or unspecified chronic kidney disease: Secondary | ICD-10-CM | POA: Insufficient documentation

## 2020-06-04 DIAGNOSIS — E1122 Type 2 diabetes mellitus with diabetic chronic kidney disease: Secondary | ICD-10-CM | POA: Insufficient documentation

## 2020-06-04 DIAGNOSIS — G8929 Other chronic pain: Secondary | ICD-10-CM | POA: Insufficient documentation

## 2020-06-04 DIAGNOSIS — N182 Chronic kidney disease, stage 2 (mild): Secondary | ICD-10-CM | POA: Insufficient documentation

## 2020-06-04 DIAGNOSIS — R1013 Epigastric pain: Secondary | ICD-10-CM | POA: Insufficient documentation

## 2020-06-04 DIAGNOSIS — Z79899 Other long term (current) drug therapy: Secondary | ICD-10-CM | POA: Insufficient documentation

## 2020-06-04 LAB — COMPREHENSIVE METABOLIC PANEL
ALT: 19 U/L (ref 0–44)
AST: 24 U/L (ref 15–41)
Albumin: 3.9 g/dL (ref 3.5–5.0)
Alkaline Phosphatase: 54 U/L (ref 38–126)
Anion gap: 9 (ref 5–15)
BUN: 31 mg/dL — ABNORMAL HIGH (ref 6–20)
CO2: 27 mmol/L (ref 22–32)
Calcium: 9.2 mg/dL (ref 8.9–10.3)
Chloride: 106 mmol/L (ref 98–111)
Creatinine, Ser: 1.65 mg/dL — ABNORMAL HIGH (ref 0.61–1.24)
GFR, Estimated: 51 mL/min — ABNORMAL LOW (ref >60)
Glucose, Bld: 82 mg/dL (ref 70–99)
Potassium: 3.9 mmol/L (ref 3.5–5.1)
Sodium: 142 mmol/L (ref 135–145)
Total Bilirubin: 0.9 mg/dL (ref 0.3–1.2)
Total Protein: 7.3 g/dL (ref 6.5–8.1)

## 2020-06-04 LAB — LIPASE, BLOOD: Lipase: 20 U/L (ref 11–51)

## 2020-06-04 LAB — CBC
HCT: 42.9 % (ref 39.0–52.0)
Hemoglobin: 13.9 g/dL (ref 13.0–17.0)
MCH: 29.4 pg (ref 26.0–34.0)
MCHC: 32.4 g/dL (ref 30.0–36.0)
MCV: 90.9 fL (ref 80.0–100.0)
Platelets: 218 10*3/uL (ref 150–400)
RBC: 4.72 MIL/uL (ref 4.22–5.81)
RDW: 13.5 % (ref 11.5–15.5)
WBC: 4.5 10*3/uL (ref 4.0–10.5)
nRBC: 0 % (ref 0.0–0.2)

## 2020-06-04 MED ORDER — ALUM & MAG HYDROXIDE-SIMETH 200-200-20 MG/5ML PO SUSP
15.0000 mL | Freq: Once | ORAL | Status: AC
  Administered 2020-06-04: 15 mL via ORAL
  Filled 2020-06-04: qty 30

## 2020-06-04 MED ORDER — LIDOCAINE VISCOUS HCL 2 % MT SOLN
15.0000 mL | Freq: Once | OROMUCOSAL | Status: AC
  Administered 2020-06-04: 15 mL via ORAL
  Filled 2020-06-04: qty 15

## 2020-06-04 MED ORDER — OMEPRAZOLE 20 MG PO CPDR: 20.0000 mg | DELAYED_RELEASE_CAPSULE | Freq: Every day | ORAL | 1 refills | Status: DC

## 2020-06-04 NOTE — ED Triage Notes (Signed)
Patient reports having abdominal pain for months.  States he has seen a GI doctor and still have not found what is causing his pain.

## 2020-06-04 NOTE — ED Notes (Signed)
This RN at bedside. Pt stating no relief with GI cocktail at this time. MD made aware

## 2020-06-04 NOTE — ED Provider Notes (Signed)
[facility] Emergency Department Provider Note   ____________________________________________   First MD Initiated Contact with Patient 06/04/20 [phone]     (approximate)  I have reviewed the triage vital signs and the nursing notes.   HISTORY  Chief Complaint Abdominal Pain    HPI [name] is a 49 y.o. male with past medical history of hypertension, diabetes, DVT, and CKD who presents to the ED complaining of abdominal pain.  Patient reports he has been dealing with intermittent episodes of upper abdominal pain for greater than 1 year.  Pain is sharp and typically affects his epigastrium, but can radiate to both his left upper quadrant and right upper quadrant.  It is not exacerbated or alleviated by anything in particular, but he had acute worsening of his pain last night while at work.  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  He has not had any associated nausea, vomiting, diarrhea, constipation, fever, cough, chest pain, or shortness of breath.  He had previously been following with GI for this issue with essentially unremarkable endoscopy and colonoscopy.  He was told they were unable to find a cause of his pain.        Past Medical History:  Diagnosis Date  . Acute blood loss as cause of postoperative anemia 04/18/2017  . ARF (acute renal failure) ([place])   . Cellulitis of right lower leg 03/31/2017  . Chronic anticoagulation 03/31/2017  . Diabetes mellitus without complication ([place])   . Diabetic ulcer of calf ([place]) 03/31/2017  . DVT of proximal lower limb ([place]) 03/20/2017   R calf area  . Escherichia coli sepsis ([place]) 04/18/2017  . Gout   . History of hiatal hernia   . Hypertension   . Hypoalbuminemia 03/31/2017  . Necrotizing fasciitis ([place])   . Right leg DVT ([place]) 03/31/2017  . Sepsis ([place]) 03/30/2017  . Sinus tachycardia 03/31/2017  . Type 2 diabetes mellitus with stage 2 chronic kidney disease, with long-term current use  of insulin ([place]) 03/27/2017    Patient Active Problem List   Diagnosis Date Noted  . Abdominal pain, chronic, epigastric   . Arthralgia of right elbow 01/13/2019  . Venous stasis ulcer ([place]) 12/31/2018  . Chronic kidney disease (CKD) stage G2/A2, mildly decreased glomerular filtration rate (GFR) between 60-89 mL/min/1.73 square meter and albuminuria creatinine ratio between 30-299 mg/g 12/31/2018  . Septic arthritis of elbow, right ([place]) 12/30/2018  . Chronic venous insufficiency 02/06/2018  . Chronic anticoagulation 03/31/2017  . Type 2 diabetes mellitus with stage 2 chronic kidney disease, with long-term current use of insulin ([place]) 03/27/2017  . History of DVT (deep vein thrombosis) 03/27/2017  . Essential hypertension 01/28/2008    Past Surgical History:  Procedure Laterality Date  . COLONOSCOPY WITH PROPOFOL N/A 02/10/2018   Procedure: COLONOSCOPY WITH PROPOFOL;  Surgeon: Lin Landsman, MD;  Location: [facility] ENDOSCOPY;  Service: Gastroenterology;  Laterality: N/A;  . ESOPHAGOGASTRODUODENOSCOPY (EGD) WITH PROPOFOL N/A 02/10/2018   Procedure: ESOPHAGOGASTRODUODENOSCOPY (EGD) WITH PROPOFOL;  Surgeon: Lin Landsman, MD;  Location: [facility] ENDOSCOPY;  Service: Gastroenterology;  Laterality: N/A;  . ESOPHAGOGASTRODUODENOSCOPY (EGD) WITH PROPOFOL N/A 04/14/2020   Procedure: ESOPHAGOGASTRODUODENOSCOPY (EGD) WITH PROPOFOL;  Surgeon: Lin Landsman, MD;  Location: [place];  Service: Endoscopy;  Laterality: N/A;  diabetic - insulin  . I & D EXTREMITY Right 04/03/2017   Procedure: IRRIGATION AND DEBRIDEMENT RIGHT LEG, APPLY WOUND VAC;  Surgeon: Newt Minion, MD;  Location: [place];  Service: Orthopedics;  Laterality: Right;  . I & D EXTREMITY Right 04/05/2017   Procedure: IRRIGATION AND DEBRIDEMENT RIGHT LEG;  Surgeon: Newt Minion, MD;  Location: [place];  Service: Orthopedics;  Laterality: Right;  . IRRIGATION AND DEBRIDEMENT ABSCESS Right 12/30/2018   Procedure: MINOR  INCISION AND DRAINAGE OF ABSCESS;  Surgeon: Verner Mould, MD;  Location: [facility];  Service: Orthopedics;  Laterality: Right;  . SCROTAL SURGERY  2006  . SKIN SPLIT GRAFT Right 04/10/2017   Procedure: SKIN GRAFT SPLIT THICKNESS RIGHT LEG;  Surgeon: Newt Minion, MD;  Location: [place];  Service: Orthopedics;  Laterality: Right;    Prior to Admission medications   Medication Sig Start Date End Date Taking? Authorizing Provider  ACCU-CHEK FASTCLIX LANCETS MISC U UTD TO CHECK BLOOD SUGAR TID 01/13/18   [provider]  ACCU-CHEK GUIDE test strip USE TO TEST QID UTD 12/13/17   [provider]  aluminum-magnesium hydroxide 200-200 MG/5ML suspension Take by mouth.    [provider]  amLODipine (NORVASC) 10 MG tablet Take 10 mg by mouth daily.    [provider]  glucose blood (ACCU-CHEK COMPACT PLUS) test strip Use 4 (four) times daily Use as instructed. 11/13/17   [provider]  hydrALAZINE (APRESOLINE) 100 MG tablet Take 100 mg by mouth 3 (three) times daily. 12/25/19   [provider]  hydrochlorothiazide (HYDRODIURIL) 25 MG tablet Take 25 mg by mouth daily.  05/08/18   [provider]  insulin aspart (NOVOLOG FLEXPEN) 100 UNIT/ML FlexPen Inject 2-15 Units into the skin 4 (four) times daily. Sliding scale 121-150 2 units; 151-200 3 units; 201-250 5 units; 251-300 8 units; 301-350 11 units; 351-400 15 units, greater than 400 - 15 units, call doctor.    [provider]  insulin glargine (LANTUS) 100 UNIT/ML injection Inject 30 Units into the skin in the morning.     [provider]  insulin lispro (HUMALOG) 100 UNIT/ML KwikPen INJECT 10 TO 15 UNITS WITH MEALS BASED ON FOOD AND BLOOD SUGAR WITH A MAX DOSE OF 50 UNITS DAILY. 11/06/19   [provider]  lisinopril (PRINIVIL,ZESTRIL) 40 MG tablet Take 40 mg by mouth daily.  05/09/18   [provider]  metoprolol tartrate (LOPRESSOR) 100 MG tablet Take 1  tablet (100 mg total) by mouth 2 (two) times daily. 04/12/17   Hosie Poisson, MD  omeprazole (PRILOSEC) 20 MG capsule Take 1 capsule (20 mg total) by mouth daily. 06/04/20 06/04/21  Blake Divine, MD    Allergies Patient has no known allergies.  Family History  Problem Relation Age of Onset  . Hypertension Mother   . Stroke Mother   . Diabetes Father   . Renal Disease Father   . Hypertension Sister   . Diabetes Sister   . Renal Disease Sister   . Iron deficiency Sister     Social History Social History   Tobacco Use  . Smoking status: Never Smoker  . Smokeless tobacco: Never Used  Vaping Use  . Vaping Use: Never used  Substance Use Topics  . Alcohol use: No  . Drug use: No    Review of Systems  Constitutional: No fever/chills Eyes: No visual changes. ENT: No sore throat. Cardiovascular: Denies chest pain. Respiratory: Denies shortness of breath. Gastrointestinal: Positive for abdominal pain.  No nausea, no vomiting.  No diarrhea.  No constipation. Genitourinary: Negative for dysuria. Musculoskeletal: Negative for back pain. Skin: Negative for rash. Neurological: Negative for headaches, focal  weakness or numbness.  ____________________________________________   PHYSICAL EXAM:  VITAL SIGNS: ED Triage Vitals  Enc Vitals Group     BP 06/04/20 0627 139/88     Pulse Rate 06/04/20 0627 72     Resp 06/04/20 0627 18     Temp 06/04/20 0627 98 F (36.7 C)     Temp Source 06/04/20 0627 Oral     SpO2 06/04/20 0627 98 %     Weight 06/04/20 0631 265 lb (120.2 kg)     Height 06/04/20 0631 5\' 11"  (1.803 m)     Head Circumference --      Peak Flow --      Pain Score 06/04/20 0631 5     Pain Loc --      Pain Edu? --      Excl. in [place]? --     Constitutional: Alert and oriented. Eyes: Conjunctivae are normal. Head: Atraumatic. Nose: No congestion/rhinnorhea. Mouth/Throat: Mucous membranes are moist. Neck: Normal ROM Cardiovascular: Normal rate, regular rhythm.  Grossly normal heart sounds. Respiratory: Normal respiratory effort.  No retractions. Lungs CTAB. Gastrointestinal: Soft and tender to palpation in the epigastrium with no rebound or guarding. No distention. Genitourinary: deferred Musculoskeletal: No lower extremity tenderness nor edema. Neurologic:  Normal speech and language. No gross focal neurologic deficits are appreciated. Skin:  Skin is warm, dry and intact. No rash noted. Psychiatric: Mood and affect are normal. Speech and behavior are normal.  ____________________________________________   LABS (all labs ordered are listed, but only abnormal results are displayed)  Labs Reviewed  COMPREHENSIVE METABOLIC PANEL - Abnormal; Notable for the following components:      Result Value   BUN 31 (*)    Creatinine, Ser 1.65 (*)    GFR, Estimated 51 (*)    All other components within normal limits  LIPASE, BLOOD  CBC  URINALYSIS, COMPLETE (UACMP) WITH MICROSCOPIC   ____________________________________________  EKG  ED ECG REPORT I, Blake Divine, the attending physician, personally viewed and interpreted this ECG.   Date: 06/04/2020  EKG Time: 6:26  Rate: 70  Rhythm: normal sinus rhythm  Axis: Normal  Intervals:none  ST&T Change: None  PROCEDURES  Procedure(s) performed (including Critical Care):  Procedures   ____________________________________________   INITIAL IMPRESSION / ASSESSMENT AND PLAN / ED COURSE       49 year old male with past medical history of hypertension, diabetes, DVT, and CKD who presents to the ED complaining of acute on chronic epigastric abdominal pain while at work overnight.  He states pain has improved since onset and episode today was very similar to what he has dealt with for greater than 1 year.  He has some mild epigastric tenderness but given this seems to be a chronic issue, we will hold off on imaging for now.  We will treat his pain with GI cocktail as prior endoscopy did show  evidence of gastritis and hiatal hernia.  We will also check labs including LFTs and lipase.  If work-up is unremarkable, plan to start patient back on PPI with referral back to GI.  Lab work is unremarkable, patient with minimal change following GI cocktail but given reassuring work-up and chronicity of pain, he is appropriate for discharge home with GI follow-up.  We will restart him on a PPI and he was counseled to return to the ED for new worsening symptoms, patient agrees with plan.      ____________________________________________   FINAL CLINICAL IMPRESSION(S) / ED DIAGNOSES  Final diagnoses:  Chronic abdominal  pain     ED Discharge Orders         Ordered    omeprazole (PRILOSEC) 20 MG capsule  Daily        06/04/20 1111           Note:  This document was prepared using Dragon voice recognition software and may include unintentional dictation errors.   Blake Divine, MD 06/04/20 1112

## 2020-06-04 NOTE — ED Notes (Signed)
Blood draw attempted x2 prior to bringing pt back. This RN unable to obtain blood work at this time. IV team consult placed. MD made aware

## 2020-07-18 ENCOUNTER — Ambulatory Visit: Payer: Self-pay | Admitting: Surgery

## 2020-07-18 NOTE — H&P (Signed)
Subjective:   CC: Non-recurrent unilateral inguinal hernia without obstruction or gangrene [K40.90]  HPI: [name] is a 50 y.o. male who was referred by Jason Hestle Whitaker, * for evaluation of above. Symptoms were first noted several days ago. Pain is dull and discomfort, confined to the left groin, without radiation. Associated with nothing specific, exacerbated by activity Lump is reducible.   Past Medical History: has a past medical history of Diabetes mellitus type 2, uncomplicated (CMS-HCC), Enlargement of scrotal sac, Essential hypertension, benign (03/27/2017), History of staph infection, and HTN (hypertension).  Past Surgical History:  Past Surgical History:  Procedure Laterality Date  . elbow surgery 12/2018  . RESECTION SCROTUM 2016   Family History: family history includes Anemia in his sister; Diabetes in his father; Heart failure in his sister; High blood pressure (Hypertension) in his maternal grandfather, maternal grandmother, and mother; No Known Problems in his brother, paternal grandfather, and paternal grandmother.  Social History: reports that he has never smoked. He has never used smokeless tobacco. He reports previous drug use. He reports that he does not drink alcohol.  Current Medications: has a current medication list which includes the following prescription(s): accu-chek fastclix lancet drum, amlodipine, blood glucose diagnostic, drum, hydralazine, hydrochlorothiazide, insulin aspart, insulin glargine, lisinopril, metoprolol tartrate, and insulin lispro.  Allergies:  Allergies as of 07/18/2020  . (No Known Allergies)   ROS:  A 15 point review of systems was performed and pertinent positives and negatives noted in HPI  Objective:    BP 127/78  Pulse 70  Ht 181.6 cm (5' 11.5")  Wt (!) 121.6 kg (268 lb)  BMI 36.86 kg/m   Constitutional : alert, appears stated age, cooperative and no distress  Lymphatics/Throat: no asymmetry, masses, or scars   Respiratory: clear to auscultation bilaterally  Cardiovascular: regular rate and rhythm  Gastrointestinal: soft, non-tender; bowel sounds normal; no masses, no organomegaly. inguinal hernia noted. large, reducible, no overlying skin changes and bilateral, L>R  Musculoskeletal: Steady gait and movement  Skin: Cool and moist, left groin surgical scars from previous scrotal surgery from infection?  Psychiatric: Normal affect, non-agitated, not confused    LABS:  n/a   RADS: CLINICAL DATA: Abdominal pain.  EXAM: CT ABDOMEN AND PELVIS WITH CONTRAST  TECHNIQUE: Multidetector CT imaging of the abdomen and pelvis was performed using the standard protocol following bolus administration of intravenous contrast.  CONTRAST: 125mL ISOVUE-300 IOPAMIDOL (ISOVUE-300) INJECTION 61%  COMPARISON: None.  FINDINGS: Lower chest: The lung bases are clear.  Hepatobiliary: Focal fatty infiltration adjacent adjacent to the falciform ligament. No suspicious hepatic lesion. Gallbladder partially distended. No calcified stone or pericholecystic inflammation. No biliary dilatation.  Pancreas: No ductal dilatation or inflammation.  Spleen: Normal in size without focal abnormality. Splenule is anteriorly.  Adrenals/Urinary Tract: Normal adrenal glands. No hydronephrosis. Symmetric bilateral perinephric edema is nonspecific. 18 mm cyst in the upper left kidney. The urinary bladder is distended and extends into a left inguinal hernia. Mild wall thickening of the bladder at the hernia neck.  Stomach/Bowel: Stomach is nondistended. No bowel wall thickening, inflammatory change or obstruction. Single diverticulum at the hepatic flexure of the colon. Normal appendix.  Vascular/Lymphatic: Mild external iliac atherosclerosis. Normal caliber abdominal aorta. No enlarged abdominal or pelvic lymph nodes.  Reproductive: Prostate is unremarkable.  Other: Left inguinal hernia contains fat and  portions of the urinary bladder. Right inguinal hernia contains only fat. No ascites or free air. Tiny fat containing umbilical hernia.  Musculoskeletal: Degenerative disc disease at L4-L5. There   are no acute or suspicious osseous abnormalities.  IMPRESSION: 1. Urinary bladder extends into a left inguinal hernia with mild wall thickening at the hernia neck which may reflect mechanical inflammation. 2. Nonspecific bilateral perinephric edema, can be seen with pyelonephritis or acute or chronic renal disease.   Electronically Signed By: Melanie Ehinger M.D. On: 01/23/2018 06:31  Assessment:    Non-recurrent unilateral inguinal hernia without obstruction or gangrene [K40.90]   Obesity CKD  Plan:    1. Non-recurrent unilateral inguinal hernia without obstruction or gangrene [K40.90]  Discussed the risk of surgery including recurrence, which can be up to 50% in the case of incisional or complex hernias, possible use of prosthetic materials (mesh) and the increased risk of mesh infxn if used, bleeding, chronic pain, post-op infxn, post-op SBO or ileus, and possible re-operation to address said risks. The risks of general anesthetic, if used, includes MI, CVA, sudden death or even reaction to anesthetic medications also discussed. Alternatives include continued observation. Benefits include possible symptom relief, prevention of incarceration, strangulation, enlargement in size over time, and the risk of emergency surgery in the face of strangulation.   Typical post-op recovery time of 3-5 days with 2 weeks of activity restrictions were also discussed.  ED return precautions given for sudden increase in pain, size of hernia with accompanying fever, nausea, and/or vomiting.  The patient verbalized understanding and all questions were answered to the patient's satisfaction.  Obesity places him at higher risk of perioperative complications and recurrence, patient understands but due  to symptoms inhibiting him from exercising.  2. Patient has elected to proceed with surgical treatment. Procedure will be scheduled. Written consent was obtained. robotic assisted laparoscopic, bilateral   

## 2020-07-25 ENCOUNTER — Other Ambulatory Visit: Payer: Self-pay

## 2020-07-25 ENCOUNTER — Encounter
Admission: RE | Admit: 2020-07-25 | Discharge: 2020-07-25 | Disposition: A | Discharge status: Home / Self Care | Payer: Medicaid Other | Source: Ambulatory Visit | Attending: Surgery | Admitting: Surgery

## 2020-07-25 NOTE — Patient Instructions (Addendum)
Your procedure is scheduled on:07-28-20 THURSDAY Report to the Registration Desk on the 1st floor of the [facility]-Then proceed to the 2nd floor Surgery Desk in the [facility] To find out your arrival time, please call [phone] between 1PM - 3PM on:07-27-20 WEDNESDAY  REMEMBER: Instructions that are not followed completely may result in serious medical risk, up to and including death; or upon the discretion of your surgeon and anesthesiologist your surgery may need to be rescheduled.  Do not eat food after midnight the night before surgery.  No gum chewing, lozengers or hard candies.  You may however, drink WATER up to 2 hours before you are scheduled to arrive for your surgery. Do not drink anything within 2 hours of your scheduled arrival time  Type 1 and Type 2 diabetics should only drink water.  TAKE THESE MEDICATIONS THE MORNING OF SURGERY WITH A SIP OF WATER: -NORVASC(AMLODIPINE) -HYDRALAZINE (APRESOLINE) -METOPROLOL (TOPROL)  DO NOT TAKE ANY INSULIN THE MORNING OF YOUR SURGERY  One week prior to surgery: Stop Anti-inflammatories (NSAIDS) such as Advil, Aleve, Ibuprofen, Motrin, Naproxen, Naprosyn and Aspirin based products such as Excedrin, Goodys Powder, BC Powder-OK TO TAKE TYLENOL IF NEEDED  Stop ANY OVER THE COUNTER supplements until after surgery.  No Alcohol for 24 hours before or after surgery.  No Smoking including e-cigarettes for 24 hours prior to surgery.  No chewable tobacco products for at least 6 hours prior to surgery.  No nicotine patches on the day of surgery.  Do not use any "recreational" drugs for at least a week prior to your surgery.  Please be advised that the combination of cocaine and anesthesia may have negative outcomes, up to and including death. If you test positive for cocaine, your surgery will be cancelled.  On the morning of surgery brush your teeth with toothpaste and water, you may rinse your mouth with mouthwash if you wish. Do  not swallow any toothpaste or mouthwash.  Do not wear jewelry, make-up, hairpins, clips or nail polish.  Do not wear lotions, powders, or perfumes.   Do not shave body from the neck down 48 hours prior to surgery just in case you cut yourself which could leave a site for infection.  Also, freshly shaved skin may become irritated if using the CHG soap.  Contact lenses, hearing aids and dentures may not be worn into surgery.  Do not bring valuables to the hospital. [facility] is not responsible for any missing/lost belongings or valuables.   Use CHG Soap as directed on instruction sheet.   Notify your doctor if there is any change in your medical condition (cold, fever, infection).  Wear comfortable clothing (specific to your surgery type) to the hospital.  Plan for stool softeners for home use; pain medications have a tendency to cause constipation. You can also help prevent constipation by eating foods high in fiber such as fruits and vegetables and drinking plenty of fluids as your diet allows.  After surgery, you can help prevent lung complications by doing breathing exercises.  Take deep breaths and cough every 1-2 hours. Your doctor may order a device called an Incentive Spirometer to help you take deep breaths. When coughing or sneezing, hold a pillow firmly against your incision with both hands. This is called "splinting." Doing this helps protect your incision. It also decreases belly discomfort.  If you are being admitted to the hospital overnight, leave your suitcase in the car. After surgery it may be brought to your room.  If you are being discharged the day of surgery, you will not be allowed to drive home. You will need a responsible adult (18 years or older) to drive you home and stay with you that night.   If you are taking public transportation, you will need to have a responsible adult (18 years or older) with you. Please confirm with your physician that it is  acceptable to use public transportation.   Please call the [facility] Dept. at [phone] if you have any questions about these instructions.  Visitation Policy:  Patients undergoing a surgery or procedure may have one family member or support person with them as long as that person is not COVID-19 positive or experiencing its symptoms.  That person may remain in the waiting area during the procedure.  Inpatient Visitation:    Visiting hours are 7 a.m. to 8 p.m. Patients will be allowed one visitor. The visitor may change daily. The visitor must pass COVID-19 screenings, use hand sanitizer when entering and exiting the patient's room and wear a mask at all times, including in the patient's room. Patients must also wear a mask when staff or their visitor are in the room. Masking is required regardless of vaccination status. Systemwide, no visitors 17 or younger.

## 2020-07-26 ENCOUNTER — Other Ambulatory Visit: Payer: Medicaid Other

## 2020-07-26 ENCOUNTER — Inpatient Hospital Stay
Admission: RE | Admit: 2020-07-26 | Discharge: 2020-07-26 | Disposition: A | Discharge status: Home / Self Care | Payer: Medicaid Other | Source: Ambulatory Visit | Attending: Surgery | Admitting: Surgery

## 2020-07-26 NOTE — Pre-Procedure Instructions (Signed)
Contacted patient via phone regarding his scheduled Covid test today for his upcoming surgery.  He stated that he was unable to find anyone to bring him to the hospital or home from the hospital after surgery and he would like to reschedule.  Gave him the phone number for Dr. Lysle Pearl and he said he would contact them.

## 2020-07-28 ENCOUNTER — Ambulatory Visit: Admission: RE | Admit: 2020-07-28 | Payer: Medicaid Other | Source: Home / Self Care | Admitting: Surgery

## 2020-07-28 ENCOUNTER — Encounter: Admission: RE | Payer: Self-pay | Source: Home / Self Care

## 2020-07-28 SURGERY — REPAIR, HERNIA, INGUINAL, ROBOT-ASSISTED, LAPAROSCOPIC, USING MESH
Anesthesia: General | Site: Groin | Laterality: Bilateral

## 2020-08-30 ENCOUNTER — Other Ambulatory Visit: Admission: RE | Admit: 2020-08-30 | Payer: Self-pay | Source: Ambulatory Visit

## 2020-08-30 ENCOUNTER — Ambulatory Visit: Admit: 2020-08-30 | Payer: Self-pay | Admitting: Surgery

## 2020-08-30 ENCOUNTER — Other Ambulatory Visit
Admission: RE | Admit: 2020-08-30 | Discharge: 2020-08-30 | Disposition: A | Discharge status: Home / Self Care | Payer: Medicaid Other | Source: Ambulatory Visit | Attending: Surgery | Admitting: Surgery

## 2020-08-30 ENCOUNTER — Other Ambulatory Visit: Payer: Self-pay

## 2020-08-30 DIAGNOSIS — Z20822 Contact with and (suspected) exposure to covid-19: Secondary | ICD-10-CM | POA: Diagnosis not present

## 2020-08-30 DIAGNOSIS — Z01812 Encounter for preprocedural laboratory examination: Secondary | ICD-10-CM | POA: Diagnosis not present

## 2020-08-30 LAB — SARS CORONAVIRUS 2 (TAT 6-24 HRS): SARS Coronavirus 2: NEGATIVE

## 2020-08-30 SURGERY — REPAIR, HERNIA, INGUINAL, ROBOT-ASSISTED, LAPAROSCOPIC, USING MESH
Anesthesia: General | Site: Groin | Laterality: Bilateral

## 2020-09-01 ENCOUNTER — Encounter
Admission: RE | Disposition: A | Discharge status: Home / Self Care | Payer: Self-pay | Source: Home / Self Care | Attending: Surgery

## 2020-09-01 ENCOUNTER — Ambulatory Visit: Admission: RE | Admit: 2020-09-01 | Payer: Self-pay | Source: Home / Self Care | Admitting: Surgery

## 2020-09-01 ENCOUNTER — Other Ambulatory Visit: Payer: Self-pay

## 2020-09-01 ENCOUNTER — Ambulatory Visit: Payer: Self-pay | Admitting: Anesthesiology

## 2020-09-01 ENCOUNTER — Encounter: Admission: RE | Payer: Self-pay | Source: Home / Self Care

## 2020-09-01 ENCOUNTER — Ambulatory Visit
Admission: RE | Admit: 2020-09-01 | Discharge: 2020-09-01 | Disposition: A | Discharge status: Home / Self Care | Payer: Self-pay | Attending: Surgery | Admitting: Surgery

## 2020-09-01 ENCOUNTER — Encounter: Payer: Self-pay | Admitting: Surgery

## 2020-09-01 DIAGNOSIS — D176 Benign lipomatous neoplasm of spermatic cord: Secondary | ICD-10-CM | POA: Insufficient documentation

## 2020-09-01 DIAGNOSIS — Z833 Family history of diabetes mellitus: Secondary | ICD-10-CM | POA: Insufficient documentation

## 2020-09-01 DIAGNOSIS — Z79899 Other long term (current) drug therapy: Secondary | ICD-10-CM | POA: Insufficient documentation

## 2020-09-01 DIAGNOSIS — E1122 Type 2 diabetes mellitus with diabetic chronic kidney disease: Secondary | ICD-10-CM | POA: Insufficient documentation

## 2020-09-01 DIAGNOSIS — Z794 Long term (current) use of insulin: Secondary | ICD-10-CM | POA: Insufficient documentation

## 2020-09-01 DIAGNOSIS — K4020 Bilateral inguinal hernia, without obstruction or gangrene, not specified as recurrent: Secondary | ICD-10-CM | POA: Insufficient documentation

## 2020-09-01 DIAGNOSIS — Z8249 Family history of ischemic heart disease and other diseases of the circulatory system: Secondary | ICD-10-CM | POA: Insufficient documentation

## 2020-09-01 DIAGNOSIS — N189 Chronic kidney disease, unspecified: Secondary | ICD-10-CM | POA: Insufficient documentation

## 2020-09-01 DIAGNOSIS — I129 Hypertensive chronic kidney disease with stage 1 through stage 4 chronic kidney disease, or unspecified chronic kidney disease: Secondary | ICD-10-CM | POA: Insufficient documentation

## 2020-09-01 HISTORY — PX: XI ROBOTIC ASSISTED INGUINAL HERNIA REPAIR WITH MESH: SHX6706

## 2020-09-01 LAB — GLUCOSE, CAPILLARY
Glucose-Capillary: 94 mg/dL (ref 70–99)
Glucose-Capillary: 131 mg/dL — ABNORMAL HIGH (ref 70–99)

## 2020-09-01 SURGERY — Surgical Case
Anesthesia: *Unknown

## 2020-09-01 SURGERY — REPAIR, HERNIA, INGUINAL, ROBOT-ASSISTED, LAPAROSCOPIC, USING MESH
Anesthesia: General | Site: Groin | Laterality: Bilateral

## 2020-09-01 MED ORDER — MIDAZOLAM HCL 2 MG/2ML IJ SOLN
INTRAMUSCULAR | Status: DC | PRN
  Administered 2020-09-01: 2 mg via INTRAVENOUS

## 2020-09-01 MED ORDER — ACETAMINOPHEN 325 MG PO TABS: 650.0000 mg | ORAL_TABLET | Freq: Three times a day (TID) | ORAL | 0 refills | Status: AC | PRN

## 2020-09-01 MED ORDER — PROMETHAZINE HCL 25 MG/ML IJ SOLN: 6.2500 mg | INTRAMUSCULAR | Status: DC | PRN

## 2020-09-01 MED ORDER — MEPERIDINE HCL 50 MG/ML IJ SOLN: 6.2500 mg | INTRAMUSCULAR | Status: DC | PRN

## 2020-09-01 MED ORDER — SODIUM CHLORIDE 0.9 % IV SOLN: INTRAVENOUS | Status: DC

## 2020-09-01 MED ORDER — OXYCODONE HCL 5 MG PO TABS: 5.0000 mg | ORAL_TABLET | Freq: Once | ORAL | Status: DC | PRN

## 2020-09-01 MED ORDER — CELECOXIB 200 MG PO CAPS: 200.0000 mg | ORAL_CAPSULE | ORAL | Status: AC

## 2020-09-01 MED ORDER — BUPIVACAINE LIPOSOME 1.3 % IJ SUSP
INTRAMUSCULAR | Status: AC
  Filled 2020-09-01: qty 20

## 2020-09-01 MED ORDER — ONDANSETRON HCL 4 MG/2ML IJ SOLN
INTRAMUSCULAR | Status: DC | PRN
  Administered 2020-09-01: 4 mg via INTRAVENOUS

## 2020-09-01 MED ORDER — SUCCINYLCHOLINE CHLORIDE 20 MG/ML IJ SOLN
INTRAMUSCULAR | Status: DC | PRN
  Administered 2020-09-01: 140 mg via INTRAVENOUS

## 2020-09-01 MED ORDER — SUGAMMADEX SODIUM 500 MG/5ML IV SOLN
INTRAVENOUS | Status: DC | PRN
  Administered 2020-09-01: 300 mg via INTRAVENOUS

## 2020-09-01 MED ORDER — CEFAZOLIN SODIUM-DEXTROSE 2-4 GM/100ML-% IV SOLN
INTRAVENOUS | Status: AC
  Filled 2020-09-01: qty 100

## 2020-09-01 MED ORDER — BUPIVACAINE LIPOSOME 1.3 % IJ SUSP
INTRAMUSCULAR | Status: DC | PRN
  Administered 2020-09-01: 20 mL

## 2020-09-01 MED ORDER — DOCUSATE SODIUM 100 MG PO CAPS: 100.0000 mg | ORAL_CAPSULE | Freq: Two times a day (BID) | ORAL | 0 refills | Status: AC | PRN

## 2020-09-01 MED ORDER — ACETAMINOPHEN 500 MG PO TABS: 1000.0000 mg | ORAL_TABLET | ORAL | Status: AC

## 2020-09-01 MED ORDER — EPINEPHRINE PF 1 MG/ML IJ SOLN
INTRAMUSCULAR | Status: AC
  Filled 2020-09-01: qty 1

## 2020-09-01 MED ORDER — HYDROMORPHONE HCL 1 MG/ML IJ SOLN: 0.2500 mg | INTRAMUSCULAR | Status: DC | PRN

## 2020-09-01 MED ORDER — DEXAMETHASONE SODIUM PHOSPHATE 10 MG/ML IJ SOLN
INTRAMUSCULAR | Status: DC | PRN
  Administered 2020-09-01: 10 mg via INTRAVENOUS

## 2020-09-01 MED ORDER — PROPOFOL 10 MG/ML IV BOLUS
INTRAVENOUS | Status: DC | PRN
  Administered 2020-09-01: 200 mg via INTRAVENOUS

## 2020-09-01 MED ORDER — CHLORHEXIDINE GLUCONATE 0.12 % MT SOLN
OROMUCOSAL | Status: AC
  Administered 2020-09-01: 15 mL
  Filled 2020-09-01: qty 15

## 2020-09-01 MED ORDER — FENTANYL CITRATE (PF) 100 MCG/2ML IJ SOLN
INTRAMUSCULAR | Status: DC | PRN
  Administered 2020-09-01 (×2): 50 ug via INTRAVENOUS

## 2020-09-01 MED ORDER — GABAPENTIN 300 MG PO CAPS
ORAL_CAPSULE | ORAL | Status: AC
  Administered 2020-09-01: 300 mg via ORAL
  Filled 2020-09-01: qty 1

## 2020-09-01 MED ORDER — EPHEDRINE SULFATE 50 MG/ML IJ SOLN
INTRAMUSCULAR | Status: DC | PRN
  Administered 2020-09-01 (×3): 10 mg via INTRAVENOUS

## 2020-09-01 MED ORDER — ACETAMINOPHEN 500 MG PO TABS
ORAL_TABLET | ORAL | Status: AC
  Administered 2020-09-01: 1000 mg via ORAL
  Filled 2020-09-01: qty 2

## 2020-09-01 MED ORDER — ROCURONIUM BROMIDE 100 MG/10ML IV SOLN
INTRAVENOUS | Status: DC | PRN
  Administered 2020-09-01: 10 mg via INTRAVENOUS
  Administered 2020-09-01: 50 mg via INTRAVENOUS
  Administered 2020-09-01: 20 mg via INTRAVENOUS
  Administered 2020-09-01: 30 mg via INTRAVENOUS
  Administered 2020-09-01: 40 mg via INTRAVENOUS
  Administered 2020-09-01: 10 mg via INTRAVENOUS

## 2020-09-01 MED ORDER — CHLORHEXIDINE GLUCONATE CLOTH 2 % EX PADS: 6.0000 | MEDICATED_PAD | Freq: Once | CUTANEOUS | Status: DC

## 2020-09-01 MED ORDER — LIDOCAINE HCL (CARDIAC) PF 100 MG/5ML IV SOSY
PREFILLED_SYRINGE | INTRAVENOUS | Status: DC | PRN
  Administered 2020-09-01: 100 mg via INTRAVENOUS

## 2020-09-01 MED ORDER — PHENYLEPHRINE HCL (PRESSORS) 10 MG/ML IV SOLN
INTRAVENOUS | Status: DC | PRN
  Administered 2020-09-01 (×5): 100 ug via INTRAVENOUS

## 2020-09-01 MED ORDER — OXYCODONE HCL 5 MG/5ML PO SOLN: 5.0000 mg | Freq: Once | ORAL | Status: DC | PRN

## 2020-09-01 MED ORDER — BUPIVACAINE-EPINEPHRINE 0.5% -1:200000 IJ SOLN
INTRAMUSCULAR | Status: DC | PRN
  Administered 2020-09-01: 20 mL
  Administered 2020-09-01: 10 mL

## 2020-09-01 MED ORDER — GABAPENTIN 300 MG PO CAPS: 300.0000 mg | ORAL_CAPSULE | ORAL | Status: AC

## 2020-09-01 MED ORDER — CEFAZOLIN SODIUM-DEXTROSE 2-4 GM/100ML-% IV SOLN
2.0000 g | INTRAVENOUS | Status: AC
  Administered 2020-09-01: 2 g via INTRAVENOUS

## 2020-09-01 MED ORDER — CELECOXIB 200 MG PO CAPS
ORAL_CAPSULE | ORAL | Status: AC
  Administered 2020-09-01: 200 mg via ORAL
  Filled 2020-09-01: qty 1

## 2020-09-01 MED ORDER — IBUPROFEN 800 MG PO TABS: 800.0000 mg | ORAL_TABLET | Freq: Three times a day (TID) | ORAL | 0 refills | Status: DC | PRN

## 2020-09-01 MED ORDER — LORAZEPAM 2 MG/ML IJ SOLN: 1.0000 mg | Freq: Once | INTRAMUSCULAR | Status: DC | PRN

## 2020-09-01 MED ORDER — DROPERIDOL 2.5 MG/ML IJ SOLN
0.6250 mg | Freq: Once | INTRAMUSCULAR | Status: DC | PRN
  Filled 2020-09-01: qty 2

## 2020-09-01 MED ORDER — BUPIVACAINE HCL (PF) 0.5 % IJ SOLN
INTRAMUSCULAR | Status: AC
  Filled 2020-09-01: qty 30

## 2020-09-01 MED ORDER — HYDROCODONE-ACETAMINOPHEN 5-325 MG PO TABS: 1.0000 | ORAL_TABLET | Freq: Four times a day (QID) | ORAL | 0 refills | Status: DC | PRN

## 2020-09-01 SURGICAL SUPPLY — 50 items
ADH SKN CLS APL DERMABOND .7 (GAUZE/BANDAGES/DRESSINGS) ×1
APL PRP STRL LF DISP 70% ISPRP (MISCELLANEOUS) ×1
BAG INFUSER PRESSURE 100CC (MISCELLANEOUS) IMPLANT
BLADE SURG SZ11 CARB STEEL (BLADE) ×2 IMPLANT
BNDG GAUZE 4.5X4.1 6PLY STRL (MISCELLANEOUS) ×2 IMPLANT
CANISTER SUCT 1200ML W/VALVE (MISCELLANEOUS) ×2 IMPLANT
CHLORAPREP W/TINT 26 (MISCELLANEOUS) ×2 IMPLANT
COVER TIP SHEARS 8 DVNC (MISCELLANEOUS) ×1 IMPLANT
COVER TIP SHEARS 8MM DA VINCI (MISCELLANEOUS) ×2
COVER WAND RF STERILE (DRAPES) IMPLANT
DEFOGGER SCOPE WARMER CLEARIFY (MISCELLANEOUS) ×2 IMPLANT
DERMABOND ADVANCED (GAUZE/BANDAGES/DRESSINGS) ×1
DERMABOND ADVANCED .7 DNX12 (GAUZE/BANDAGES/DRESSINGS) ×1 IMPLANT
DRAPE ARM DVNC X/XI (DISPOSABLE) ×3 IMPLANT
DRAPE COLUMN DVNC XI (DISPOSABLE) ×1 IMPLANT
DRAPE DA VINCI XI ARM (DISPOSABLE) ×6
DRAPE DA VINCI XI COLUMN (DISPOSABLE) ×2
ELECT CAUTERY BLADE 6.4 (BLADE) IMPLANT
ELECT REM PT RETURN 9FT ADLT (ELECTROSURGICAL) ×2
ELECTRODE REM PT RTRN 9FT ADLT (ELECTROSURGICAL) ×1 IMPLANT
GLOVE SURG SYN 6.5 ES PF (GLOVE) ×4 IMPLANT
GLOVE SURG UNDER POLY LF SZ7 (GLOVE) ×4 IMPLANT
GOWN STRL REUS W/ TWL LRG LVL3 (GOWN DISPOSABLE) ×3 IMPLANT
GOWN STRL REUS W/TWL LRG LVL3 (GOWN DISPOSABLE) ×6
IRRIGATOR SUCT 8 DISP DVNC XI (IRRIGATION / IRRIGATOR) IMPLANT
IRRIGATOR SUCTION 8MM XI DISP (IRRIGATION / IRRIGATOR)
IV NS 1000ML (IV SOLUTION)
IV NS 1000ML BAXH (IV SOLUTION) IMPLANT
LABEL OR SOLS (LABEL) IMPLANT
MANIFOLD NEPTUNE II (INSTRUMENTS) ×2 IMPLANT
MESH 3DMAX 4X6 RT LRG (Mesh General) ×1 IMPLANT
MESH 3DMAX MID 4X6 RT LRG (Mesh General) ×1 IMPLANT
MESH PROGRIP LAP SELF FIXATING (Mesh General) ×2 IMPLANT
MESH PROGRIP LAP SLF FIX 16X12 (Mesh General) ×1 IMPLANT
NEEDLE HYPO 22GX1.5 SAFETY (NEEDLE) ×2 IMPLANT
NEEDLE INSUFFLATION 14GA 120MM (NEEDLE) ×2 IMPLANT
OBTURATOR OPTICAL STANDARD 8MM (TROCAR) ×2
OBTURATOR OPTICAL STND 8 DVNC (TROCAR) ×1
OBTURATOR OPTICALSTD 8 DVNC (TROCAR) ×1 IMPLANT
PACK LAP CHOLECYSTECTOMY (MISCELLANEOUS) ×2 IMPLANT
PENCIL ELECTRO HAND CTR (MISCELLANEOUS) ×2 IMPLANT
SEAL CANN UNIV 5-8 DVNC XI (MISCELLANEOUS) ×3 IMPLANT
SEAL XI 5MM-8MM UNIVERSAL (MISCELLANEOUS) ×6
SET TUBE SMOKE EVAC HIGH FLOW (TUBING) ×2 IMPLANT
SOLUTION ELECTROLUBE (MISCELLANEOUS) ×2 IMPLANT
SUT MNCRL AB 4-0 PS2 18 (SUTURE) ×2 IMPLANT
SUT VIC AB 2-0 SH 27 (SUTURE) ×4
SUT VIC AB 2-0 SH 27XBRD (SUTURE) ×2 IMPLANT
SUT VLOC 90 6 CV-15 VIOLET (SUTURE) ×4 IMPLANT
SYR 30ML LL (SYRINGE) ×2 IMPLANT

## 2020-09-01 NOTE — Anesthesia Procedure Notes (Signed)
Procedure Name: Intubation Date/Time: 09/01/2020 7:40 AM Performed by: Aline Brochure, CRNA Pre-anesthesia Checklist: Patient identified, Emergency Drugs available, Suction available and Patient being monitored Patient Re-evaluated:Patient Re-evaluated prior to induction Oxygen Delivery Method: Circle system utilized Preoxygenation: Pre-oxygenation with 100% oxygen Induction Type: IV induction Ventilation: Mask ventilation without difficulty Laryngoscope Size: McGraph and 4 Grade View: Grade I Tube type: Oral Tube size: 7.5 mm Number of attempts: 1 Airway Equipment and Method: Stylet and Video-laryngoscopy Placement Confirmation: ETT inserted through vocal cords under direct vision,  positive ETCO2 and breath sounds checked- equal and bilateral Secured at: 21 cm Tube secured with: Tape Dental Injury: Teeth and Oropharynx as per pre-operative assessment

## 2020-09-01 NOTE — Interval H&P Note (Signed)
History and Physical Interval Note:  09/01/2020 7:06 AM  [name]  has presented today for surgery, with the diagnosis of K40.90 non-recurrent bilateral hernia w/o obstrucstion or gangrene.  The various methods of treatment have been discussed with the patient and family. After consideration of risks, benefits and other options for treatment, the patient has consented to  Procedure(s): XI ROBOTIC [place] (Bilateral) as a surgical intervention.  The patient's history has been reviewed, patient examined, no change in status, stable for surgery.  I have reviewed the patient's chart and labs.  Questions were answered to the patient's satisfaction.     [name]

## 2020-09-01 NOTE — Transfer of Care (Signed)
Immediate Anesthesia Transfer of Care Note  Patient: [name]  Procedure(s) Performed: XI ROBOTIC ASSISTED INGUINAL HERNIA REPAIR WITH MESH (Bilateral Groin)  Patient Location: PACU  Anesthesia Type:General  Level of Consciousness: sedated  Airway & Oxygen Therapy: Patient Spontanous Breathing and Patient connected to face mask oxygen  Post-op Assessment: Post -op Vital signs reviewed and stable  Post vital signs: stable  Last Vitals:  Vitals Value Taken Time  BP 98/69 09/01/20 1116  Temp    Pulse 61 09/01/20 1120  Resp 11 09/01/20 1120  SpO2 97 % 09/01/20 1120  Vitals shown include unvalidated device data.  Last Pain: There were no vitals filed for this visit.       Complications: No complications documented.

## 2020-09-01 NOTE — Op Note (Signed)
Preoperative diagnosis: Bilateral reducible initial inguinal Hernia.  Postoperative diagnosis: Bilateral reducible initial inguinal Hernia  Procedure: Robotic assisted laparoscopic Bilateral reducible initial inguinal hernia repair with mesh  Anesthesia: General  Surgeon: Dr. Lysle Pearl  Wound Classification: Clean  Specimen: none  Complications: None  Estimated Blood Loss: 39mL   Indications:  inguinal hernia. Repair was indicated to avoid complications of incarceration, obstruction and pain, and a prosthetic mesh repair was elected.  See H&P for further details.  Findings: 1. Vas Deferens and cord structures identified and preserved 2. Bard 3D max medium weight mesh used for repair 3. Adequate hemostasis achieved  Description of procedure: The patient was taken to the operating room. A time-out was completed verifying correct patient, procedure, site, positioning, and implant(s) and/or special equipment prior to beginning this procedure.  Area was prepped and draped in the usual sterile fashion. An incision was marked 20 cm above the pubic tubercle, slightly above the umbilicus  Scrotum wrapped in Kerlix roll.  Veress needle inserted at palmer's point.  Saline drop test noted to be positive with gradual increase in pressure after initiation of gas insufflation.  15 mm of pressure was achieved prior to removing the Veress needle and then placing a 8 mm port via the Optiview technique through the supraumbilical site.  Inspection of the area afterwards noted no injury to the surrounding organs during insertion of the needle and the port.  2 port sites were marked 8 cm to the lateral sides of the initial port, and a 8 mm robotic port was placed on the left side, another 8 mm robotic port on the right side under direct supervision.  Local anesthesia  infused to the preplanned incision sites prior to insertion of the port.  The [place] was then brought into the operative field and  docked to the ports.  Examination of the abdominal cavity noted a left indirect inguinal hernia.  A peritoneal flap was created approximately 8cm cephalad to the defect by using scissors with electrocautery.  Dissection was carried down towards the pubic tubercle, developing the myopectineal orifice view.  Laterally the flap was carried towards the ASIS.  Very large hernia sac with part of bladder wall was noted, which carefully dissected away from the adjacent tissues to be fully reduced out of hernia cavity.  Any bleeding was controlled with combination of electrocautery and manual pressure.    After confirming adequate dissection and the peritoneal reflection completely down and away from the cord structures, lap Progrip mesh was placed within the anterior abdominal wall, sfter noting proper placement of the mesh with the peritoneal reflection deep to it, the previously created peritoneal flap was secured back up to the anterior abdominal wall using running 3-0 V-Lock.  Suture then cut and removed from abdominal cavity.  Attention turned to right inguinal region. A peritoneal flap was created approximately 8cm cephalad to the defect by using scissors with electrocautery.  Dissection was carried down towards the pubic tubercle, developing the myopectineal orifice view.  Laterally the flap was carried towards the ASIS.  Large lipoma and smaller indirect hernia sacl was noted, which carefully dissected away from the adjacent tissues to be fully reduced out of hernia cavity.  Any bleeding was controlled with combination of electrocautery and manual pressure.    After confirming adequate dissection and the peritoneal reflection completely down and away from the cord structures, a Large Bard 3DMax medium weight mesh was placed within the anterior abdominal wall, secured in place using  2-0 Vicryl on an SH needle immediately above the pubic tubercle.  After noting proper placement of the mesh with the peritoneal  reflection deep to it, the previously created peritoneal flap was secured back up to the anterior abdominal wall using running 3-0 V-Lock.  Both needles were then removed out of the abdominal cavity, Xi platform undocked from the ports and removed off of operative field.   Both needles were then removed out of the abdominal cavity, Xi platform undocked from the ports and removed off of operative field.  exparel infused as ilioinguinal block bilaterally.  Abdomen then desufflated and ports removed. All the skin incisions were then closed with a subcuticular stitch of Monocryl 4-0. Dermabond was applied. The testis was gently pulled down into its anatomic position in the scrotum.  The patient tolerated the procedure well and was taken to the postanesthesia care unit in stable condition. Sponge and instrument count correct at end of procedure.

## 2020-09-01 NOTE — Anesthesia Postprocedure Evaluation (Signed)
Anesthesia Post Note  Patient: [name]  Procedure(s) Performed: XI ROBOTIC ASSISTED INGUINAL HERNIA REPAIR WITH MESH (Bilateral Groin)  Patient location during evaluation: PACU Anesthesia Type: General Level of consciousness: awake Pain management: pain level controlled Vital Signs Assessment: post-procedure vital signs reviewed and stable Respiratory status: spontaneous breathing Cardiovascular status: stable Postop Assessment: no apparent nausea or vomiting Anesthetic complications: no   No complications documented.   Last Vitals:  Vitals:   09/01/20 1217 09/01/20 1229  BP:  131/81  Pulse:  69  Resp:  16  Temp: (!) 36.2 C (!) 36.1 C  SpO2:  93%    Last Pain:  Vitals:   09/01/20 1229  TempSrc: Temporal  PainSc: 0-No pain                 Neva Seat

## 2020-09-01 NOTE — H&P (Signed)
Subjective:   CC: Non-recurrent unilateral inguinal hernia without obstruction or gangrene [K40.90]  HPI: [name] is a 50 y.o. male who was referred by Donnamarie Rossetti, * for evaluation of above. Symptoms were first noted several days ago. Pain is dull and discomfort, confined to the left groin, without radiation. Associated with nothing specific, exacerbated by activity Lump is reducible.   Past Medical History: has a past medical history of Diabetes mellitus type 2, uncomplicated (CMS-HCC), Enlargement of scrotal sac, Essential hypertension, benign (03/27/2017), History of staph infection, and HTN (hypertension).  Past Surgical History:  Past Surgical History:  Procedure Laterality Date  . elbow surgery 12/2018  . RESECTION SCROTUM 2016   Family History: family history includes Anemia in his sister; Diabetes in his father; Heart failure in his sister; High blood pressure (Hypertension) in his maternal grandfather, maternal grandmother, and mother; No Known Problems in his brother, paternal grandfather, and paternal grandmother.  Social History: reports that he has never smoked. He has never used smokeless tobacco. He reports previous drug use. He reports that he does not drink alcohol.  Current Medications: has a current medication list which includes the following prescription(s): accu-chek fastclix lancet drum, amlodipine, blood glucose diagnostic, drum, hydralazine, hydrochlorothiazide, insulin aspart, insulin glargine, lisinopril, metoprolol tartrate, and insulin lispro.  Allergies:  Allergies as of 07/18/2020  . (No Known Allergies)   ROS:  A 15 point review of systems was performed and pertinent positives and negatives noted in HPI  Objective:    BP 127/78  Pulse 70  Ht 181.6 cm (5' 11.5")  Wt (!) 121.6 kg (268 lb)  BMI 36.86 kg/m   Constitutional : alert, appears stated age, cooperative and no distress  Lymphatics/Throat: no asymmetry, masses, or scars   Respiratory: clear to auscultation bilaterally  Cardiovascular: regular rate and rhythm  Gastrointestinal: soft, non-tender; bowel sounds normal; no masses, no organomegaly. inguinal hernia noted. large, reducible, no overlying skin changes and bilateral, L>R  Musculoskeletal: Steady gait and movement  Skin: Cool and moist, left groin surgical scars from previous scrotal surgery from infection?  Psychiatric: Normal affect, non-agitated, not confused    LABS:  n/a   RADS: CLINICAL DATA: Abdominal pain.  EXAM: CT ABDOMEN AND PELVIS WITH CONTRAST  TECHNIQUE: Multidetector CT imaging of the abdomen and pelvis was performed using the standard protocol following bolus administration of intravenous contrast.  CONTRAST: 175mL ISOVUE-300 IOPAMIDOL (ISOVUE-300) INJECTION 61%  COMPARISON: None.  FINDINGS: Lower chest: The lung bases are clear.  Hepatobiliary: Focal fatty infiltration adjacent adjacent to the falciform ligament. No suspicious hepatic lesion. Gallbladder partially distended. No calcified stone or pericholecystic inflammation. No biliary dilatation.  Pancreas: No ductal dilatation or inflammation.  Spleen: Normal in size without focal abnormality. Splenule is anteriorly.  Adrenals/Urinary Tract: Normal adrenal glands. No hydronephrosis. Symmetric bilateral perinephric edema is nonspecific. 18 mm cyst in the upper left kidney. The urinary bladder is distended and extends into a left inguinal hernia. Mild wall thickening of the bladder at the hernia neck.  Stomach/Bowel: Stomach is nondistended. No bowel wall thickening, inflammatory change or obstruction. Single diverticulum at the hepatic flexure of the colon. Normal appendix.  Vascular/Lymphatic: Mild external iliac atherosclerosis. Normal caliber abdominal aorta. No enlarged abdominal or pelvic lymph nodes.  Reproductive: Prostate is unremarkable.  Other: Left inguinal hernia contains fat and  portions of the urinary bladder. Right inguinal hernia contains only fat. No ascites or free air. Tiny fat containing umbilical hernia.  Musculoskeletal: Degenerative disc disease at L4-L5. There  are no acute or suspicious osseous abnormalities.  IMPRESSION: 1. Urinary bladder extends into a left inguinal hernia with mild wall thickening at the hernia neck which may reflect mechanical inflammation. 2. Nonspecific bilateral perinephric edema, can be seen with pyelonephritis or acute or chronic renal disease.   Electronically Signed By: Jeb Levering M.D. On: 01/23/2018 06:31  Assessment:    Non-recurrent unilateral inguinal hernia without obstruction or gangrene [K40.90]   Obesity CKD  Plan:    1. Non-recurrent unilateral inguinal hernia without obstruction or gangrene [K40.90]  Discussed the risk of surgery including recurrence, which can be up to 50% in the case of incisional or complex hernias, possible use of prosthetic materials (mesh) and the increased risk of mesh infxn if used, bleeding, chronic pain, post-op infxn, post-op SBO or ileus, and possible re-operation to address said risks. The risks of general anesthetic, if used, includes MI, CVA, sudden death or even reaction to anesthetic medications also discussed. Alternatives include continued observation. Benefits include possible symptom relief, prevention of incarceration, strangulation, enlargement in size over time, and the risk of emergency surgery in the face of strangulation.   Typical post-op recovery time of 3-5 days with 2 weeks of activity restrictions were also discussed.  ED return precautions given for sudden increase in pain, size of hernia with accompanying fever, nausea, and/or vomiting.  The patient verbalized understanding and all questions were answered to the patient's satisfaction.  Obesity places him at higher risk of perioperative complications and recurrence, patient understands but due  to symptoms inhibiting him from exercising.  2. Patient has elected to proceed with surgical treatment. Procedure will be scheduled. Written consent was obtained. robotic assisted laparoscopic, bilateral

## 2020-09-01 NOTE — Anesthesia Preprocedure Evaluation (Signed)
Anesthesia Evaluation  Patient identified by MRN, date of birth, ID band Patient awake    Reviewed: Allergy & Precautions, H&P , NPO status , Patient's Chart, lab work & pertinent test results  Airway Mallampati: II       Dental no notable dental hx. (+) Teeth Intact   Pulmonary neg pulmonary ROS,    Pulmonary exam normal breath sounds clear to auscultation       Cardiovascular hypertension, negative cardio ROS Normal cardiovascular exam Rhythm:Regular Rate:Normal     Neuro/Psych negative neurological ROS  negative psych ROS   GI/Hepatic Neg liver ROS, hiatal hernia,   Endo/Other  negative endocrine ROSdiabetes  Renal/GU CRFRenal disease  negative genitourinary   Musculoskeletal  (+) Arthritis ,   Abdominal   Peds negative pediatric ROS (+)  Hematology negative hematology ROS (+) anemia ,   Anesthesia Other Findings Past Medical History: 04/18/2017: Acute blood loss as cause of postoperative anemia No date: ARF (acute renal failure) ([place]) 03/31/2017: Cellulitis of right lower leg 03/31/2017: Chronic anticoagulation No date: Diabetes mellitus without complication ([place]) 1/54/0086: Diabetic ulcer of calf ([place]) 03/20/2017: DVT of proximal lower limb (HCC)     Comment:  R calf area 04/18/2017: Escherichia coli sepsis (HCC) No date: Gout No date: History of hiatal hernia No date: Hypertension 03/31/2017: Hypoalbuminemia No date: Necrotizing fasciitis ([place]) 03/31/2017: Right leg DVT ([place]) 03/30/2017: Sepsis ([place]) 03/31/2017: Sinus tachycardia 03/27/2017: Type 2 diabetes mellitus with stage 2 chronic kidney  disease, with long-term current use of insulin (HCC)   Reproductive/Obstetrics negative OB ROS                             Anesthesia Physical Anesthesia Plan  ASA: III  Anesthesia Plan: General   Post-op Pain Management:    Induction: Intravenous  PONV Risk Score and Plan: 2 and  Ondansetron, Dexamethasone and Treatment may vary due to age or medical condition  Airway Management Planned: Oral ETT  Additional Equipment:   Intra-op Plan:   Post-operative Plan: Extubation in OR  Informed Consent: I have reviewed the patients History and Physical, chart, labs and discussed the procedure including the risks, benefits and alternatives for the proposed anesthesia with the patient or authorized representative who has indicated his/her understanding and acceptance.     Dental advisory given  Plan Discussed with: CRNA, Anesthesiologist and Surgeon  Anesthesia Plan Comments:         Anesthesia Quick Evaluation

## 2020-09-01 NOTE — Discharge Instructions (Signed)
AMBULATORY SURGERY  DISCHARGE INSTRUCTIONS   1) The drugs that you were given will stay in your system until tomorrow so for the next 24 hours you should not:  A) Drive an automobile B) Make any legal decisions C) Drink any alcoholic beverage   2) You may resume regular meals tomorrow.  Today it is better to start with liquids and gradually work up to solid foods.  You may eat anything you prefer, but it is better to start with liquids, then soup and crackers, and gradually work up to solid foods.   3) Please notify your doctor immediately if you have any unusual bleeding, trouble breathing, redness and pain at the surgery site, drainage, fever, or pain not relieved by medication.    4) Additional Instructions:        Please contact your physician with any problems or Same Day Surgery at [phone], Monday through Friday 6 am to 4 pm, or [facility] number at [phone].   Hernia repair, Care After This sheet gives you information about how to care for yourself after your procedure. Your health care provider may also give you more specific instructions. If you have problems or questions, contact your health care provider. What can I expect after the procedure? After your procedure, it is common to have the following:  Pain in your abdomen, especially in the incision areas. You will be given medicine to control the pain.  Tiredness. This is a normal part of the recovery process. Your energy level will return to normal over the next several weeks.  Changes in your bowel movements, such as constipation or needing to go more often. Talk with your health care provider about how to manage this. Follow these instructions at home: Medicines   tylenol and advil as needed for discomfort.  Please alternate between the two every four hours as needed for pain.     Use narcotics, if prescribed, only when tylenol and motrin is not enough to control pain.    325-650mg every 8hrs to max of 3000mg/24hrs (including the 325mg in every norco dose) for the tylenol.     Advil up to 800mg per dose every 8hrs as needed for pain.    PLEASE RECORD NUMBER OF PILLS TAKEN UNTIL NEXT FOLLOW UP APPT.  THIS WILL HELP DETERMINE HOW READY YOU ARE TO BE RELEASED FROM ANY ACTIVITY RESTRICTIONS  Do not drive or use heavy machinery while taking prescription pain medicine.  Do not drink alcohol while taking prescription pain medicine.  Incision care     Follow instructions from your health care provider about how to take care of your incision areas. Make sure you: ? Keep your incisions clean and dry. ? Wash your hands with soap and water before and after applying medicine to the areas, and before and after changing your bandage (dressing). If soap and water are not available, use hand sanitizer. ? Change your dressing as told by your health care provider. ? Leave stitches (sutures), skin glue, or adhesive strips in place. These skin closures may need to stay in place for 2 weeks or longer. If adhesive strip edges start to loosen and curl up, you may trim the loose edges. Do not remove adhesive strips completely unless your health care provider tells you to do that.  Do not wear tight clothing over the incisions. Tight clothing may rub and irritate the incision areas, which may cause the incisions to open.  Do not take baths, swim, or   use a hot tub until your health care provider approves. OK TO SHOWER IN 24HRS.    Check your incision area every day for signs of infection. Check for: ? More redness, swelling, or pain. ? More fluid or blood. ? Warmth. ? Pus or a bad smell. Activity  Avoid lifting anything that is heavier than 10 lb (4.5 kg) for 2 weeks or until your health care provider says it is okay.  No pushing/pulling greater than 30lbs  You may resume normal activities as told by your health care provider. Ask your health care provider what activities are  safe for you.  Take rest breaks during the day as needed. Eating and drinking  Follow instructions from your health care provider about what you can eat after surgery.  To prevent or treat constipation while you are taking prescription pain medicine, your health care provider may recommend that you: ? Drink enough fluid to keep your urine clear or pale yellow. ? Take over-the-counter or prescription medicines. ? Eat foods that are high in fiber, such as fresh fruits and vegetables, whole grains, and beans. ? Limit foods that are high in fat and processed sugars, such as fried and sweet foods. General instructions  Ask your health care provider when you will need an appointment to get your sutures or staples removed.  Keep all follow-up visits as told by your health care provider. This is important. Contact a health care provider if:  You have more redness, swelling, or pain around your incisions.  You have more fluid or blood coming from the incisions.  Your incisions feel warm to the touch.  You have pus or a bad smell coming from your incisions or your dressing.  You have a fever.  You have an incision that breaks open (edges not staying together) after sutures or staples have been removed. Get help right away if:  You develop a rash.  You have chest pain or difficulty breathing.  You have pain or swelling in your legs.  You feel light-headed or you faint.  Your abdomen swells (becomes distended).  You have nausea or vomiting.  You have blood in your stool (feces). This information is not intended to replace advice given to you by your health care provider. Make sure you discuss any questions you have with your health care provider. Document Released: 01/19/2005 Document Revised: 03/21/2018 Document Reviewed: 04/02/2016 Elsevier Interactive Patient Education  2019 [employer]    

## 2020-09-02 ENCOUNTER — Encounter: Payer: Self-pay | Admitting: Surgery

## 2020-09-02 NOTE — Progress Notes (Signed)
UNABLE TO LEAVE MESSAGE DUE TO PATIENT NOT HAVING VOICEMAIL WITH NAME

## 2020-10-04 ENCOUNTER — Emergency Department (HOSPITAL_COMMUNITY)
Admission: EM | Admit: 2020-10-04 | Discharge: 2020-10-04 | Disposition: A | Discharge status: Home / Self Care | Payer: Self-pay | Attending: Emergency Medicine | Admitting: Emergency Medicine

## 2020-10-04 ENCOUNTER — Other Ambulatory Visit: Payer: Self-pay

## 2020-10-04 ENCOUNTER — Encounter (HOSPITAL_COMMUNITY): Payer: Self-pay

## 2020-10-04 DIAGNOSIS — G510 Bell's palsy: Secondary | ICD-10-CM | POA: Diagnosis not present

## 2020-10-04 DIAGNOSIS — E1122 Type 2 diabetes mellitus with diabetic chronic kidney disease: Secondary | ICD-10-CM | POA: Insufficient documentation

## 2020-10-04 DIAGNOSIS — I129 Hypertensive chronic kidney disease with stage 1 through stage 4 chronic kidney disease, or unspecified chronic kidney disease: Secondary | ICD-10-CM | POA: Insufficient documentation

## 2020-10-04 DIAGNOSIS — L97209 Non-pressure chronic ulcer of unspecified calf with unspecified severity: Secondary | ICD-10-CM | POA: Insufficient documentation

## 2020-10-04 DIAGNOSIS — E11622 Type 2 diabetes mellitus with other skin ulcer: Secondary | ICD-10-CM | POA: Insufficient documentation

## 2020-10-04 DIAGNOSIS — Z79899 Other long term (current) drug therapy: Secondary | ICD-10-CM | POA: Insufficient documentation

## 2020-10-04 DIAGNOSIS — Z794 Long term (current) use of insulin: Secondary | ICD-10-CM | POA: Insufficient documentation

## 2020-10-04 DIAGNOSIS — N182 Chronic kidney disease, stage 2 (mild): Secondary | ICD-10-CM | POA: Insufficient documentation

## 2020-10-04 MED ORDER — VALACYCLOVIR HCL 1 G PO TABS: 1000.0000 mg | ORAL_TABLET | Freq: Three times a day (TID) | ORAL | 0 refills | Status: AC

## 2020-10-04 MED ORDER — PREDNISONE 20 MG PO TABS: 60.0000 mg | ORAL_TABLET | Freq: Every day | ORAL | 0 refills | Status: AC

## 2020-10-04 NOTE — ED Provider Notes (Signed)
[facility] DEPT Provider Note   CSN: [id number] Arrival date & time: 10/04/20  2119     History Chief Complaint  Patient presents with  . Facial Swelling    [name] is a 50 y.o. male.  About 1.5 weeks ago, the patient noted that his right face seemed to be swollen.  He noticed his right lip was drooping.  One of his friends told him he might have a dental problem, and another friend told him he might have Bell's palsy.  He presented for further evaluation.  Does have diabetes, and he states his blood sugars are usually in the upper 80s to lower 100s.  The history is provided by the patient.  Neurologic Problem This is a new problem. The current episode started more than 1 week ago (1.5 weeks). The problem occurs constantly. The problem has not changed since onset.Pertinent negatives include no chest pain, no abdominal pain, no headaches and no shortness of breath. Nothing aggravates the symptoms. Nothing relieves the symptoms. He has tried nothing for the symptoms. The treatment provided no relief.       Past Medical History:  Diagnosis Date  . Acute blood loss as cause of postoperative anemia 04/18/2017  . ARF (acute renal failure) ([place])   . Cellulitis of right lower leg 03/31/2017  . Chronic anticoagulation 03/31/2017  . Diabetes mellitus without complication ([place])   . Diabetic ulcer of calf ([place]) 03/31/2017  . DVT of proximal lower limb ([place]) 03/20/2017   R calf area  . Escherichia coli sepsis ([place]) 04/18/2017  . Gout   . History of hiatal hernia   . Hypertension   . Hypoalbuminemia 03/31/2017  . Necrotizing fasciitis ([place])   . Right leg DVT ([place]) 03/31/2017  . Sepsis ([place]) 03/30/2017  . Sinus tachycardia 03/31/2017  . Type 2 diabetes mellitus with stage 2 chronic kidney disease, with long-term current use of insulin ([place]) 03/27/2017    Patient Active Problem List   Diagnosis Date Noted  . Abdominal pain, chronic, epigastric   .  Arthralgia of right elbow 01/13/2019  . Venous stasis ulcer ([place]) 12/31/2018  . Chronic kidney disease (CKD) stage G2/A2, mildly decreased glomerular filtration rate (GFR) between 60-89 mL/min/1.73 square meter and albuminuria creatinine ratio between 30-299 mg/g 12/31/2018  . Septic arthritis of elbow, right ([place]) 12/30/2018  . Chronic venous insufficiency 02/06/2018  . Chronic anticoagulation 03/31/2017  . Type 2 diabetes mellitus with stage 2 chronic kidney disease, with long-term current use of insulin ([place]) 03/27/2017  . History of DVT (deep vein thrombosis) 03/27/2017  . Essential hypertension 01/28/2008    Past Surgical History:  Procedure Laterality Date  . COLONOSCOPY WITH PROPOFOL N/A 02/10/2018   Procedure: COLONOSCOPY WITH PROPOFOL;  Surgeon: Lin Landsman, MD;  Location: [facility] ENDOSCOPY;  Service: Gastroenterology;  Laterality: N/A;  . ELBOW SURGERY Right    DUE TO INFECTION  . ESOPHAGOGASTRODUODENOSCOPY (EGD) WITH PROPOFOL N/A 02/10/2018   Procedure: ESOPHAGOGASTRODUODENOSCOPY (EGD) WITH PROPOFOL;  Surgeon: Lin Landsman, MD;  Location: [facility];  Service: Gastroenterology;  Laterality: N/A;  . ESOPHAGOGASTRODUODENOSCOPY (EGD) WITH PROPOFOL N/A 04/14/2020   Procedure: ESOPHAGOGASTRODUODENOSCOPY (EGD) WITH PROPOFOL;  Surgeon: Lin Landsman, MD;  Location: [facility];  Service: Endoscopy;  Laterality: N/A;  diabetic - insulin  . I & D EXTREMITY Right 04/03/2017   Procedure: IRRIGATION AND DEBRIDEMENT RIGHT LEG, APPLY WOUND VAC;  Surgeon: Newt Minion, MD;  Location: [facility];  Service: Orthopedics;  Laterality: Right;  .  I & D EXTREMITY Right 04/05/2017   Procedure: IRRIGATION AND DEBRIDEMENT RIGHT LEG;  Surgeon: Newt Minion, MD;  Location: [facility];  Service: Orthopedics;  Laterality: Right;  . IRRIGATION AND DEBRIDEMENT ABSCESS Right 12/30/2018   Procedure: MINOR INCISION AND DRAINAGE OF ABSCESS;  Surgeon: Verner Mould, MD;  Location: [facility];   Service: Orthopedics;  Laterality: Right;  . SCROTAL SURGERY  2006  . SKIN SPLIT GRAFT Right 04/10/2017   Procedure: SKIN GRAFT SPLIT THICKNESS RIGHT LEG;  Surgeon: Newt Minion, MD;  Location: [facility];  Service: Orthopedics;  Laterality: Right;  . XI ROBOTIC ASSISTED INGUINAL HERNIA REPAIR WITH MESH Bilateral 09/01/2020   Procedure: XI ROBOTIC ASSISTED INGUINAL HERNIA REPAIR WITH MESH;  Surgeon: Benjamine Sprague, DO;  Location: [facility];  Service: General;  Laterality: Bilateral;       Family History  Problem Relation Age of Onset  . Hypertension Mother   . Stroke Mother   . Diabetes Father   . Renal Disease Father   . Hypertension Sister   . Diabetes Sister   . Renal Disease Sister   . Iron deficiency Sister     Social History   Tobacco Use  . Smoking status: Never Smoker  . Smokeless tobacco: Never Used  Vaping Use  . Vaping Use: Never used  Substance Use Topics  . Alcohol use: No  . Drug use: No    Home Medications Prior to Admission medications   Medication Sig Start Date End Date Taking? Authorizing Provider  predniSONE (DELTASONE) 20 MG tablet Take 3 tablets (60 mg total) by mouth daily for 5 days. 10/04/20 10/09/20 Yes Arnaldo Natal, MD  valACYclovir (VALTREX) 1000 MG tablet Take 1 tablet (1,000 mg total) by mouth 3 (three) times daily for 7 days. 10/04/20 10/11/20 Yes Arnaldo Natal, MD  ACCU-CHEK FASTCLIX LANCETS MISC U UTD TO CHECK BLOOD SUGAR TID 01/13/18   [provider]  ACCU-CHEK GUIDE test strip USE TO TEST QID UTD 12/13/17   [provider]  amLODipine (NORVASC) 10 MG tablet Take 10 mg by mouth every morning.    [provider]  amLODipine (NORVASC) 10 MG tablet Take 10 mg by mouth daily.    [provider]  glucose blood (ACCU-CHEK COMPACT PLUS) test strip Use 4 (four) times daily Use as instructed. 11/13/17   [provider]  hydrALAZINE (APRESOLINE) 100 MG tablet Take 100 mg by mouth 3 (three) times daily. 12/25/19    [provider]  hydrALAZINE (APRESOLINE) 100 MG tablet Take 100 mg by mouth 3 (three) times daily.    [provider]  hydrochlorothiazide (HYDRODIURIL) 25 MG tablet Take 25 mg by mouth every morning. 05/08/18   [provider]  hydrochlorothiazide (HYDRODIURIL) 25 MG tablet Take 25 mg by mouth daily.    [provider]  HYDROcodone-acetaminophen (NORCO) 5-325 MG tablet Take 1 tablet by mouth every 6 (six) hours as needed for up to 6 doses for moderate pain. 09/01/20   Lysle Pearl, Isami, DO  ibuprofen (ADVIL) 800 MG tablet Take 1 tablet (800 mg total) by mouth every 8 (eight) hours as needed for mild pain or moderate pain. 09/01/20   Lysle Pearl, Isami, DO  insulin aspart (NOVOLOG) 100 UNIT/ML FlexPen Inject 10-15 Units into the skin 4 (four) times daily. Sliding scale    [provider]  insulin aspart (NOVOLOG) 100 UNIT/ML FlexPen Inject 4-16 Units into the skin 4 (four) times daily.    [provider]  insulin glargine (LANTUS) 100 UNIT/ML injection Inject 30 Units into the skin in the morning.     [provider]  insulin glargine (LANTUS) 100 UNIT/ML injection Inject 30 Units into the skin daily before breakfast.    [provider]  lisinopril (PRINIVIL,ZESTRIL) 40 MG tablet Take 40 mg by mouth every morning. 05/09/18   [provider]  lisinopril (ZESTRIL) 40 MG tablet Take 40 mg by mouth daily.    [provider]  metoprolol tartrate (LOPRESSOR) 100 MG tablet Take 1 tablet (100 mg total) by mouth 2 (two) times daily. 04/12/17   Hosie Poisson, MD  metoprolol tartrate (LOPRESSOR) 100 MG tablet Take 100 mg by mouth 2 (two) times daily.    [provider]    Allergies    Patient has no known allergies.  Review of Systems   Review of Systems  Constitutional: Negative for chills and fever.  HENT: Negative for ear pain and sore throat.   Eyes: Negative for pain and visual disturbance.  Respiratory: Negative  for cough and shortness of breath.   Cardiovascular: Negative for chest pain and palpitations.  Gastrointestinal: Negative for abdominal pain and vomiting.  Genitourinary: Negative for dysuria and hematuria.  Musculoskeletal: Negative for arthralgias and back pain.  Skin: Negative for color change and rash.  Neurological: Positive for facial asymmetry. Negative for seizures, syncope, weakness, numbness and headaches.  All other systems reviewed and are negative.   Physical Exam Updated Vital Signs BP (!) 144/94 (BP Location: Right Arm)   Pulse 76   Temp 98 F (36.7 C) (Oral)   Resp 17   Ht 5\' 8"  (1.727 m)   Wt 117.9 kg   SpO2 100%   BMI 39.53 kg/m   Physical Exam Vitals and nursing note reviewed.  Constitutional:      Appearance: He is well-developed.  HENT:     Head: Normocephalic and atraumatic.  Eyes:     Conjunctiva/sclera: Conjunctivae normal.  Cardiovascular:     Rate and Rhythm: Normal rate and regular rhythm.     Heart sounds: No murmur heard.   Pulmonary:     Effort: Pulmonary effort is normal. No respiratory distress.     Breath sounds: Normal breath sounds.  Musculoskeletal:     Cervical back: Neck supple.  Skin:    General: Skin is warm and dry.  Neurological:     Mental Status: He is alert and oriented to person, place, and time.     Cranial Nerves: Facial asymmetry present.     Sensory: Sensation is intact.     Motor: No weakness or pronator drift.     Coordination: Finger-Nose-Finger Test normal.     Gait: Gait is intact.     Comments: The patient has weakness of his right eyelids, and drooping of the right mouth.     ED Results / Procedures / Treatments   Labs (all labs ordered are listed, but only abnormal results are displayed) Labs Reviewed - No data to display  EKG None  Radiology No results found.  Procedures Procedures   Medications Ordered in ED Medications - No data to display  ED Course  I have reviewed the triage vital  signs and the nursing notes.  Pertinent labs & imaging results that were available during my care of the patient were reviewed by me and considered in my medical decision making (see chart for details).    MDM Rules/Calculators/A&P  Patient's exam is consistent with Bell's palsy.  No other neurologic symptoms to suggest a central cause.  He was given medication for this disorder.  Because he is diabetic, we spent some time talking about the possibility of hyperglycemia when taking steroids. He monitors his blood sugar closely, and he is comfortable adjusting his medication if necessary.  We also discussed lubrication of his eye.  Return precautions were given. Final Clinical Impression(s) / ED Diagnoses Final diagnoses:  Right-sided Bell's palsy    Rx / DC Orders ED Discharge Orders         Ordered    valACYclovir (VALTREX) 1000 MG tablet  3 times daily        10/04/20 0928    predniSONE (DELTASONE) 20 MG tablet  Daily        10/04/20 0928           Arnaldo Natal, MD 10/04/20 [phone]

## 2020-10-04 NOTE — ED Triage Notes (Signed)
Patient c/o right facial swelling x 1 1/2 weeks. patient denies any dental issues at this time. Patient denies any problems breathing or swalowling, but does have difficulty saying words that start with p's  and b's. Patient has drooping of the right side of his mouth.

## 2020-10-04 NOTE — Discharge Instructions (Signed)
As we discussed, steroids can sometimes increase her blood sugar.  Please keep close tabs on your glucose levels during your steroid course.  Also obtain over-the-counter eye lubricating drops.

## 2020-11-25 ENCOUNTER — Emergency Department (HOSPITAL_COMMUNITY): Payer: Self-pay

## 2020-11-25 ENCOUNTER — Inpatient Hospital Stay (HOSPITAL_COMMUNITY)
Admission: EM | Admit: 2020-11-25 | Discharge: 2020-11-26 | DRG: 065 | Disposition: A | Discharge status: Home / Self Care | Payer: Self-pay | Attending: Family Medicine | Admitting: Family Medicine

## 2020-11-25 ENCOUNTER — Encounter (HOSPITAL_COMMUNITY): Payer: Self-pay | Admitting: Emergency Medicine

## 2020-11-25 ENCOUNTER — Other Ambulatory Visit: Payer: Self-pay

## 2020-11-25 DIAGNOSIS — R4781 Slurred speech: Secondary | ICD-10-CM | POA: Diagnosis present

## 2020-11-25 DIAGNOSIS — N189 Chronic kidney disease, unspecified: Secondary | ICD-10-CM

## 2020-11-25 DIAGNOSIS — R2681 Unsteadiness on feet: Secondary | ICD-10-CM | POA: Diagnosis present

## 2020-11-25 DIAGNOSIS — E669 Obesity, unspecified: Secondary | ICD-10-CM | POA: Diagnosis present

## 2020-11-25 DIAGNOSIS — Z6841 Body Mass Index (BMI) 40.0 and over, adult: Secondary | ICD-10-CM

## 2020-11-25 DIAGNOSIS — E1122 Type 2 diabetes mellitus with diabetic chronic kidney disease: Secondary | ICD-10-CM

## 2020-11-25 DIAGNOSIS — N182 Chronic kidney disease, stage 2 (mild): Secondary | ICD-10-CM | POA: Diagnosis present

## 2020-11-25 DIAGNOSIS — R29701 NIHSS score 1: Secondary | ICD-10-CM | POA: Diagnosis present

## 2020-11-25 DIAGNOSIS — Z7901 Long term (current) use of anticoagulants: Secondary | ICD-10-CM

## 2020-11-25 DIAGNOSIS — Z841 Family history of disorders of kidney and ureter: Secondary | ICD-10-CM

## 2020-11-25 DIAGNOSIS — G8191 Hemiplegia, unspecified affecting right dominant side: Secondary | ICD-10-CM | POA: Diagnosis present

## 2020-11-25 DIAGNOSIS — Z79899 Other long term (current) drug therapy: Secondary | ICD-10-CM

## 2020-11-25 DIAGNOSIS — Z20822 Contact with and (suspected) exposure to covid-19: Secondary | ICD-10-CM | POA: Diagnosis present

## 2020-11-25 DIAGNOSIS — I10 Essential (primary) hypertension: Secondary | ICD-10-CM | POA: Diagnosis present

## 2020-11-25 DIAGNOSIS — Z833 Family history of diabetes mellitus: Secondary | ICD-10-CM

## 2020-11-25 DIAGNOSIS — Z823 Family history of stroke: Secondary | ICD-10-CM

## 2020-11-25 DIAGNOSIS — I129 Hypertensive chronic kidney disease with stage 1 through stage 4 chronic kidney disease, or unspecified chronic kidney disease: Secondary | ICD-10-CM | POA: Diagnosis present

## 2020-11-25 DIAGNOSIS — I639 Cerebral infarction, unspecified: Principal | ICD-10-CM | POA: Diagnosis present

## 2020-11-25 DIAGNOSIS — Z794 Long term (current) use of insulin: Secondary | ICD-10-CM

## 2020-11-25 DIAGNOSIS — Z86718 Personal history of other venous thrombosis and embolism: Secondary | ICD-10-CM

## 2020-11-25 DIAGNOSIS — Z8249 Family history of ischemic heart disease and other diseases of the circulatory system: Secondary | ICD-10-CM

## 2020-11-25 DIAGNOSIS — R2990 Unspecified symptoms and signs involving the nervous system: Secondary | ICD-10-CM

## 2020-11-25 DIAGNOSIS — M109 Gout, unspecified: Secondary | ICD-10-CM | POA: Diagnosis present

## 2020-11-25 LAB — I-STAT CHEM 8, ED
BUN: 18 mg/dL (ref 6–20)
Calcium, Ion: 1.22 mmol/L (ref 1.15–1.40)
Chloride: 105 mmol/L (ref 98–111)
Creatinine, Ser: 1.4 mg/dL — ABNORMAL HIGH (ref 0.61–1.24)
Glucose, Bld: 92 mg/dL (ref 70–99)
HCT: 48 % (ref 39.0–52.0)
Hemoglobin: 16.3 g/dL (ref 13.0–17.0)
Potassium: 4.3 mmol/L (ref 3.5–5.1)
Sodium: 140 mmol/L (ref 135–145)
TCO2: 26 mmol/L (ref 22–32)

## 2020-11-25 LAB — CBC
HCT: 47.6 % (ref 39.0–52.0)
Hemoglobin: 15.2 g/dL (ref 13.0–17.0)
MCH: 29.7 pg (ref 26.0–34.0)
MCHC: 31.9 g/dL (ref 30.0–36.0)
MCV: 93 fL (ref 80.0–100.0)
Platelets: 319 10*3/uL (ref 150–400)
RBC: 5.12 MIL/uL (ref 4.22–5.81)
RDW: 13.2 % (ref 11.5–15.5)
WBC: 5.1 10*3/uL (ref 4.0–10.5)
nRBC: 0 % (ref 0.0–0.2)

## 2020-11-25 LAB — PROTIME-INR
INR: 1 (ref 0.8–1.2)
Prothrombin Time: 13.2 seconds (ref 11.4–15.2)

## 2020-11-25 LAB — DIFFERENTIAL
Abs Immature Granulocytes: 0.02 10*3/uL (ref 0.00–0.07)
Basophils Absolute: 0 10*3/uL (ref 0.0–0.1)
Basophils Relative: 0 %
Eosinophils Absolute: 0 10*3/uL (ref 0.0–0.5)
Eosinophils Relative: 0 %
Immature Granulocytes: 0 %
Lymphocytes Relative: 19 %
Lymphs Abs: 1 10*3/uL (ref 0.7–4.0)
Monocytes Absolute: 0.3 10*3/uL (ref 0.1–1.0)
Monocytes Relative: 6 %
Neutro Abs: 3.8 10*3/uL (ref 1.7–7.7)
Neutrophils Relative %: 75 %

## 2020-11-25 LAB — CBG MONITORING, ED: Glucose-Capillary: 78 mg/dL (ref 70–99)

## 2020-11-25 LAB — COMPREHENSIVE METABOLIC PANEL
ALT: 16 U/L (ref 0–44)
AST: 28 U/L (ref 15–41)
Albumin: 3.6 g/dL (ref 3.5–5.0)
Alkaline Phosphatase: 63 U/L (ref 38–126)
Anion gap: 9 (ref 5–15)
BUN: 16 mg/dL (ref 6–20)
CO2: 24 mmol/L (ref 22–32)
Calcium: 9.3 mg/dL (ref 8.9–10.3)
Chloride: 104 mmol/L (ref 98–111)
Creatinine, Ser: 1.29 mg/dL — ABNORMAL HIGH (ref 0.61–1.24)
GFR, Estimated: >60 mL/min (ref >60)
Glucose, Bld: 93 mg/dL (ref 70–99)
Potassium: 4.2 mmol/L (ref 3.5–5.1)
Sodium: 137 mmol/L (ref 135–145)
Total Bilirubin: 0.5 mg/dL (ref 0.3–1.2)
Total Protein: 7.4 g/dL (ref 6.5–8.1)

## 2020-11-25 LAB — RESP PANEL BY RT-PCR (FLU A&B, COVID) ARPGX2
Influenza A by PCR: NEGATIVE
Influenza B by PCR: NEGATIVE
SARS Coronavirus 2 by RT PCR: NEGATIVE

## 2020-11-25 LAB — APTT: aPTT: 41 seconds — ABNORMAL HIGH (ref 24–36)

## 2020-11-25 LAB — ETHANOL: Alcohol, Ethyl (B): <10 mg/dL (ref <10)

## 2020-11-25 NOTE — ED Notes (Signed)
Patient transported to MRI 

## 2020-11-25 NOTE — Consult Note (Signed)
Neurology Consultation Reason for Consult: Right-sided weakness Referring Physician: [place], J  CC: Right-sided weakness  History is obtained from: Patient  HPI: [name] is a 50 y.o. male with a history of diabetes, hypertension who presents with right-sided weakness that started on awakening from his nap.  He states that he laid down around 11 AM and then subsequently when he went to get out of bed he found himself unable to move his right side.  This was around 6:45 PM.  Since then, his symptoms have significantly improved but he still has some right-sided weakness.  He denies numbness.  He was brought in by EMS, and given that he reported onset at 6:45 PM, code stroke was activated.  Of note, he was recently diagnosed with Bell's palsy and has persistent right facial weakness due to this.  He also had transient unilateral weakness about a month ago as well.  LKW: 11 AM tpa given?: no, outside window NIHSS: 2    ROS: A 14 point ROS was performed and is negative except as noted in the HPI.   Past Medical History:  Diagnosis Date  . Acute blood loss as cause of postoperative anemia 04/18/2017  . ARF (acute renal failure) ([place])   . Cellulitis of right lower leg 03/31/2017  . Chronic anticoagulation 03/31/2017  . Diabetes mellitus without complication ([place])   . Diabetic ulcer of calf ([place]) 03/31/2017  . DVT of proximal lower limb ([place]) 03/20/2017   R calf area  . Escherichia coli sepsis ([place]) 04/18/2017  . Gout   . History of hiatal hernia   . Hypertension   . Hypoalbuminemia 03/31/2017  . Necrotizing fasciitis ([place])   . Right leg DVT ([place]) 03/31/2017  . Sepsis ([place]) 03/30/2017  . Sinus tachycardia 03/31/2017  . Type 2 diabetes mellitus with stage 2 chronic kidney disease, with long-term current use of insulin ([place]) 03/27/2017     Family History  Problem Relation Age of Onset  . Hypertension Mother   . Stroke Mother   . Diabetes Father   . Renal Disease Father   .  Hypertension Sister   . Diabetes Sister   . Renal Disease Sister   . Iron deficiency Sister      Social History:  reports that he has never smoked. He has never used smokeless tobacco. He reports that he does not drink alcohol and does not use drugs.   Exam: Current vital signs: BP 133/89   Pulse 66   Resp 15   SpO2 99%  Vital signs in last 24 hours: Pulse Rate:  [66] 66 (05/13 2027) Resp:  [15] 15 (05/13 2027) BP: (133)/(89) 133/89 (05/13 2027) SpO2:  [99 %] 99 % (05/13 2027)   Physical Exam  Constitutional: Appears well-developed and well-nourished.  Psych: Affect appropriate to situation Eyes: No scleral injection HENT: No OP obstruction MSK: no joint deformities.  Cardiovascular: Normal rate and regular rhythm.  Respiratory: Effort normal, non-labored breathing GI: Soft.  No distension. There is no tenderness.  Skin: WDI  Neuro: Mental Status: Patient is awake, alert, oriented to person, place, month, year, and situation. Patient is able to give a clear and coherent history. No signs of aphasia or neglect Cranial Nerves: II: Visual Fields are full. Pupils are equal, round, and reactive to light.   III,IV, VI: EOMI without ptosis or diploplia.  V: Facial sensation is symmetric to temperature VII: Facial movement with right facial weakness VIII: hearing is intact to voice X: Uvula elevates symmetrically  XI: Shoulder shrug is symmetric. XII: tongue is midline without atrophy or fasciculations.  Motor: Tone is normal. Bulk is normal. 5/5 strength was present on the left, though he does not drift on the right he does have some weakness to confrontation in both the right arm and leg. Sensory: Sensation is symmetric to light touch and temperature in the arms and legs. Cerebellar: FNFintact bilaterally      I have reviewed labs in epic and the results pertinent to this consultation are: Creatinine 1.29  I have reviewed the images obtained: CT  head-negative  Impression: 50 year old male with improving right-sided weakness.  I suspect this represents transient ischemic attack versus small mostly resolved infarct.  He will need to be admitted for secondary risk factor modification.  Recommendations: - HgbA1c, fasting lipid panel - MRI, MRA  of the brain without contrast - Frequent neuro checks - Echocardiogram - Carotid dopplers - Prophylactic therapy-Antiplatelet med: Aspirin -81 mg and Plavix 75 mg daily after 300 mg load - Risk factor modification - Telemetry monitoring - PT consult, OT consult, Speech consult - Stroke team to follow    Roland Rack, MD [facility] [phone]  If 7pm- 7am, please page neurology on call as listed in [place].

## 2020-11-25 NOTE — ED Provider Notes (Addendum)
[facility] EMERGENCY DEPARTMENT Provider Note   CSN: [id number] Arrival date & time: 11/25/20  2024  An emergency department physician performed an initial assessment on this suspected stroke patient at 2028.  History Chief Complaint  Patient presents with  . Code Stroke    [name] is a 50 y.o. male.  Patient presents with right-sided weakness unsteady gait.  He states he went to sleep around 11 AM with a normal neuro exam.  He woke up at 1600 and noticed difficulty walking felt weak on the right side had slurred speech she was on a Zoom call with his friend who noticed all these as well and advised him to come to the hospital.  Patient otherwise denies any fever vomiting cough diarrhea.  Denies any pain.  Addendum patient states he has a history of Bell's palsy with residual right-sided facial weakness.        Past Medical History:  Diagnosis Date  . Acute blood loss as cause of postoperative anemia 04/18/2017  . ARF (acute renal failure) ([place])   . Cellulitis of right lower leg 03/31/2017  . Chronic anticoagulation 03/31/2017  . Diabetes mellitus without complication ([place])   . Diabetic ulcer of calf ([place]) 03/31/2017  . DVT of proximal lower limb ([place]) 03/20/2017   R calf area  . Escherichia coli sepsis ([place]) 04/18/2017  . Gout   . History of hiatal hernia   . Hypertension   . Hypoalbuminemia 03/31/2017  . Necrotizing fasciitis ([place])   . Right leg DVT ([place]) 03/31/2017  . Sepsis ([place]) 03/30/2017  . Sinus tachycardia 03/31/2017  . Type 2 diabetes mellitus with stage 2 chronic kidney disease, with long-term current use of insulin ([place]) 03/27/2017    Patient Active Problem List   Diagnosis Date Noted  . Abdominal pain, chronic, epigastric   . Arthralgia of right elbow 01/13/2019  . Venous stasis ulcer ([place]) 12/31/2018  . Chronic kidney disease (CKD) stage G2/A2, mildly decreased glomerular filtration rate (GFR) between 60-89 mL/min/1.73 square meter  and albuminuria creatinine ratio between 30-299 mg/g 12/31/2018  . Septic arthritis of elbow, right ([place]) 12/30/2018  . Chronic venous insufficiency 02/06/2018  . Chronic anticoagulation 03/31/2017  . Type 2 diabetes mellitus with stage 2 chronic kidney disease, with long-term current use of insulin ([place]) 03/27/2017  . History of DVT (deep vein thrombosis) 03/27/2017  . Essential hypertension 01/28/2008    Past Surgical History:  Procedure Laterality Date  . COLONOSCOPY WITH PROPOFOL N/A 02/10/2018   Procedure: COLONOSCOPY WITH PROPOFOL;  Surgeon: Lin Landsman, MD;  Location[facility] ENDOSCOPY;  Service: Gastroenterology;  Laterality: N/A;  . ELBOW SURGERY Right    DUE TO INFECTION  . ESOPHAGOGASTRODUODENOSCOPY (EGD) WITH PROPOFOL N/A 02/10/2018   Procedure: ESOPHAGOGASTRODUODENOSCOPY (EGD) WITH PROPOFOL;  Surgeon: Lin Landsman, MD;  Location: [facility];  Service: Gastroenterology;  Laterality: N/A;  . ESOPHAGOGASTRODUODENOSCOPY (EGD) WITH PROPOFOL N/A 04/14/2020   Procedure: ESOPHAGOGASTRODUODENOSCOPY (EGD) WITH PROPOFOL;  Surgeon: Lin Landsman, MD;  Location: [facility];  Service: Endoscopy;  Laterality: N/A;  diabetic - insulin  . I & D EXTREMITY Right 04/03/2017   Procedure: IRRIGATION AND DEBRIDEMENT RIGHT LEG, APPLY WOUND VAC;  Surgeon: Newt Minion, MD;  Location: [facility];  Service: Orthopedics;  Laterality: Right;  . I & D EXTREMITY Right 04/05/2017   Procedure: IRRIGATION AND DEBRIDEMENT RIGHT LEG;  Surgeon: Newt Minion, MD;  Location: [place] [facility];  Service: Orthopedics;  Laterality: Right;  . IRRIGATION AND DEBRIDEMENT  ABSCESS Right 12/30/2018   Procedure: MINOR INCISION AND DRAINAGE OF ABSCESS;  Surgeon: Verner Mould, MD;  Location: [facility];  Service: Orthopedics;  Laterality: Right;  . SCROTAL SURGERY  2006  . SKIN SPLIT GRAFT Right 04/10/2017   Procedure: SKIN GRAFT SPLIT THICKNESS RIGHT LEG;  Surgeon: Newt Minion, MD;  Location: [facility];   Service: Orthopedics;  Laterality: Right;  . XI ROBOTIC ASSISTED INGUINAL HERNIA REPAIR WITH MESH Bilateral 09/01/2020   Procedure: XI ROBOTIC ASSISTED INGUINAL HERNIA REPAIR WITH MESH;  Surgeon: Benjamine Sprague, DO;  Location: [facility];  Service: General;  Laterality: Bilateral;       Family History  Problem Relation Age of Onset  . Hypertension Mother   . Stroke Mother   . Diabetes Father   . Renal Disease Father   . Hypertension Sister   . Diabetes Sister   . Renal Disease Sister   . Iron deficiency Sister     Social History   Tobacco Use  . Smoking status: Never Smoker  . Smokeless tobacco: Never Used  Vaping Use  . Vaping Use: Never used  Substance Use Topics  . Alcohol use: No  . Drug use: No    Home Medications Prior to Admission medications   Medication Sig Start Date End Date Taking? Authorizing Provider  ACCU-CHEK FASTCLIX LANCETS MISC U UTD TO CHECK BLOOD SUGAR TID 01/13/18   [provider]  ACCU-CHEK GUIDE test strip USE TO TEST QID UTD 12/13/17   [provider]  amLODipine (NORVASC) 10 MG tablet Take 10 mg by mouth every morning.    [provider]  amLODipine (NORVASC) 10 MG tablet Take 10 mg by mouth daily.    [provider]  glucose blood (ACCU-CHEK COMPACT PLUS) test strip Use 4 (four) times daily Use as instructed. 11/13/17   [provider]  hydrALAZINE (APRESOLINE) 100 MG tablet Take 100 mg by mouth 3 (three) times daily. 12/25/19   [provider]  hydrALAZINE (APRESOLINE) 100 MG tablet Take 100 mg by mouth 3 (three) times daily.    [provider]  hydrochlorothiazide (HYDRODIURIL) 25 MG tablet Take 25 mg by mouth every morning. 05/08/18   [provider]  hydrochlorothiazide (HYDRODIURIL) 25 MG tablet Take 25 mg by mouth daily.    [provider]  HYDROcodone-acetaminophen (NORCO) 5-325 MG tablet Take 1 tablet by mouth every 6 (six) hours as needed for up to 6 doses for moderate  pain. 09/01/20   Lysle Pearl, Isami, DO  ibuprofen (ADVIL) 800 MG tablet Take 1 tablet (800 mg total) by mouth every 8 (eight) hours as needed for mild pain or moderate pain. 09/01/20   Lysle Pearl, Isami, DO  insulin aspart (NOVOLOG) 100 UNIT/ML FlexPen Inject 10-15 Units into the skin 4 (four) times daily. Sliding scale    [provider]  insulin aspart (NOVOLOG) 100 UNIT/ML FlexPen Inject 4-16 Units into the skin 4 (four) times daily.    [provider]  insulin glargine (LANTUS) 100 UNIT/ML injection Inject 30 Units into the skin in the morning.     [provider]  insulin glargine (LANTUS) 100 UNIT/ML injection Inject 30 Units into the skin daily before breakfast.    [provider]  lisinopril (PRINIVIL,ZESTRIL) 40 MG tablet Take 40 mg by mouth every morning. 05/09/18   [provider]  lisinopril (ZESTRIL) 40 MG tablet Take 40 mg by mouth daily.    [provider]  metoprolol tartrate (LOPRESSOR) 100 MG tablet  Take 1 tablet (100 mg total) by mouth 2 (two) times daily. 04/12/17   Hosie Poisson, MD  metoprolol tartrate (LOPRESSOR) 100 MG tablet Take 100 mg by mouth 2 (two) times daily.    [provider]    Allergies    Patient has no known allergies.  Review of Systems   Review of Systems  Constitutional: Negative for fever.  HENT: Negative for ear pain and sore throat.   Eyes: Negative for pain.  Respiratory: Negative for cough.   Cardiovascular: Negative for chest pain.  Gastrointestinal: Negative for abdominal pain.  Genitourinary: Negative for flank pain.  Musculoskeletal: Negative for back pain.  Skin: Negative for color change and rash.  Neurological: Negative for syncope.  All other systems reviewed and are negative.   Physical Exam Updated Vital Signs BP 131/88   Pulse (!) 59   Temp 98.1 F (36.7 C) (Oral)   Resp 13   Ht 5\' 8"  (1.727 m)   Wt 120 kg   SpO2 98%   BMI 40.22 kg/m   Physical Exam Constitutional:       General: He is not in acute distress.    Appearance: He is well-developed.  HENT:     Head: Normocephalic.     Nose: Nose normal.  Eyes:     Extraocular Movements: Extraocular movements intact.  Cardiovascular:     Rate and Rhythm: Normal rate.  Pulmonary:     Effort: Pulmonary effort is normal.  Skin:    Coloration: Skin is not jaundiced.  Neurological:     Mental Status: He is alert.     Comments: Right-sided facial droop noted.  Questionable right upper extremity weakness.  Questionable slurred speech.     ED Results / Procedures / Treatments   Labs (all labs ordered are listed, but only abnormal results are displayed) Labs Reviewed  APTT - Abnormal; Notable for the following components:      Result Value   aPTT 41 (*)    All other components within normal limits  COMPREHENSIVE METABOLIC PANEL - Abnormal; Notable for the following components:   Creatinine, Ser 1.29 (*)    All other components within normal limits  I-STAT CHEM 8, ED - Abnormal; Notable for the following components:   Creatinine, Ser 1.40 (*)    All other components within normal limits  RESP PANEL BY RT-PCR (FLU A&B, COVID) ARPGX2  ETHANOL  PROTIME-INR  CBC  DIFFERENTIAL  RAPID URINE DRUG SCREEN, HOSP PERFORMED  URINALYSIS, ROUTINE W REFLEX MICROSCOPIC  CBG MONITORING, ED    EKG None  Radiology CT HEAD CODE STROKE WO CONTRAST  Result Date: 11/25/2020 CLINICAL DATA:  Code stroke. Initial evaluation for acute right-sided weakness, right facial droop. EXAM: CT HEAD WITHOUT CONTRAST TECHNIQUE: Contiguous axial images were obtained from the base of the skull through the vertex without intravenous contrast. COMPARISON:  None. FINDINGS: Brain: Cerebral volume within normal limits for age. No acute intracranial hemorrhage. No acute large vessel territory infarct. No mass lesion, midline shift or mass effect. No hydrocephalus or extra-axial fluid collection. Vascular: No appreciable hyperdense vessel.  Skull: Scalp soft tissues and calvarium within normal limits. Sinuses/Orbits: Globes orbital soft tissues are normal. Bilateral maxillary sinus retention cyst noted. Paranasal sinuses are otherwise clear. No mastoid effusion. Other: None. ASPECTS [facility] Stroke Program Early CT Score) - Ganglionic level infarction (caudate, lentiform nuclei, internal capsule, insula, M1-M3 cortex): 7 - Supraganglionic infarction (M4-M6 cortex): 3 Total score (0-10 with 10 being normal): 10 IMPRESSION: 1. Negative  head CT.  No acute intracranial abnormality. 2. ASPECTS is 10. These results were communicated to Dr. Leonel Ramsay at 8:46 pmon 5/13/2022by text page via the [facility] messaging system. Electronically Signed   By: Jeannine Boga M.D.   On: 11/25/2020 20:47    Procedures .Critical Care E&M Performed by: Luna Fuse, MD  Critical care provider statement:    Critical care time (minutes):  30   Critical care time was exclusive of:  Separately billable procedures and treating other patients   Critical care was necessary to treat or prevent imminent or life-threatening deterioration of the following conditions:  CNS failure or compromise After initial E/M assessment, critical care services were subsequently performed that were exclusive of separately billable procedures or treatment.       Medications Ordered in ED Medications - No data to display  ED Course  I have reviewed the triage vital signs and the nursing notes.  Pertinent labs & imaging results that were available during my care of the patient were reviewed by me and considered in my medical decision making (see chart for details).    MDM Rules/Calculators/A&P                          Patient was a stroke activation prior to arrival.  He was seen in the ER with neurologist.  tPA was not given as last known well was greater than 4.5 hours.  CT of the head is unremarkable, MRI imaging pursued.  Patient will be signed out to oncoming  provider.   Final Clinical Impression(s) / ED Diagnoses Final diagnoses:  Stroke-like episode    Rx / DC Orders ED Discharge Orders    None       Luna Fuse, MD 11/25/20 2214    Luna Fuse, MD 11/25/20 2215

## 2020-11-25 NOTE — Code Documentation (Signed)
Stroke Response Nurse Documentation Code Documentation  Alyan Raliegh Ip Johndrow is a 50 y.o. male arriving to [place] ED via [facility] on 5/13 with past medical hx of bells palsy and HTN. Code stroke was activated by EMS. Patient from home where he was LKW at 1100 and now complaining of right sided weakness, right facial droop, and slurred speech. On No antithrombotic. Stroke team at the bedside on patient arrival. Labs drawn and patient cleared for CT by Dr. Alcide Goodness. Patient to CT with team. NIHSS 1, see documentation for details and code stroke times. Patient with right facial droop on exam. The following imaging was completed:  CT. Patient is not a candidate for tPA due to Out of window. Bedside handoff with ED RN Mykenzie.    Madelynn Done  Rapid Response RN

## 2020-11-25 NOTE — ED Triage Notes (Signed)
Pt BIB [facility] as code stroke for R side weakness, and R side facial droop. Pt states he went to sleep at 1100, woke up at 1800 and felt normal, he went to join a zoom call around 1845 and noticed his speech was slurred, and he had R side weakness. Aox4, vss. CBG for ems was 55, pt given oral glucose w/ increase to 72

## 2020-11-25 NOTE — ED Notes (Signed)
Pt still in MRI will obtain vitals when pt returns.

## 2020-11-26 ENCOUNTER — Other Ambulatory Visit: Payer: Self-pay | Admitting: Neurology

## 2020-11-26 ENCOUNTER — Inpatient Hospital Stay (HOSPITAL_COMMUNITY): Payer: Self-pay

## 2020-11-26 ENCOUNTER — Other Ambulatory Visit: Payer: Self-pay | Admitting: Physician Assistant

## 2020-11-26 ENCOUNTER — Encounter (HOSPITAL_COMMUNITY): Payer: Medicaid Other

## 2020-11-26 DIAGNOSIS — I10 Essential (primary) hypertension: Secondary | ICD-10-CM

## 2020-11-26 DIAGNOSIS — I639 Cerebral infarction, unspecified: Secondary | ICD-10-CM

## 2020-11-26 DIAGNOSIS — I6389 Other cerebral infarction: Secondary | ICD-10-CM

## 2020-11-26 DIAGNOSIS — G459 Transient cerebral ischemic attack, unspecified: Secondary | ICD-10-CM

## 2020-11-26 DIAGNOSIS — N189 Chronic kidney disease, unspecified: Secondary | ICD-10-CM

## 2020-11-26 LAB — LIPID PANEL
Cholesterol: 109 mg/dL (ref 0–200)
HDL: 50 mg/dL (ref >40)
LDL Cholesterol: 51 mg/dL (ref 0–99)
Total CHOL/HDL Ratio: 2.2 RATIO
Triglycerides: 40 mg/dL (ref <150)
VLDL: 8 mg/dL (ref 0–40)

## 2020-11-26 LAB — ECHOCARDIOGRAM COMPLETE
Area-P 1/2: 3.6 cm2
Height: 68 in
S' Lateral: 3 cm
Weight: 4232.83 oz

## 2020-11-26 LAB — CBG MONITORING, ED
Glucose-Capillary: 84 mg/dL (ref 70–99)
Glucose-Capillary: 78 mg/dL (ref 70–99)
Glucose-Capillary: 116 mg/dL — ABNORMAL HIGH (ref 70–99)

## 2020-11-26 LAB — URINALYSIS, ROUTINE W REFLEX MICROSCOPIC
Bilirubin Urine: NEGATIVE
Glucose, UA: NEGATIVE mg/dL
Hgb urine dipstick: NEGATIVE
Ketones, ur: 5 mg/dL — AB
Leukocytes,Ua: NEGATIVE
Nitrite: NEGATIVE
Protein, ur: NEGATIVE mg/dL
Specific Gravity, Urine: 1.016 (ref 1.005–1.030)
pH: 5 (ref 5.0–8.0)

## 2020-11-26 LAB — RAPID URINE DRUG SCREEN, HOSP PERFORMED
Amphetamines: NOT DETECTED
Barbiturates: NOT DETECTED
Benzodiazepines: NOT DETECTED
Cocaine: NOT DETECTED
Opiates: NOT DETECTED
Tetrahydrocannabinol: NOT DETECTED

## 2020-11-26 LAB — HEMOGLOBIN A1C
Hgb A1c MFr Bld: 5.8 % — ABNORMAL HIGH (ref 4.8–5.6)
Mean Plasma Glucose: 119.76 mg/dL

## 2020-11-26 LAB — HIV ANTIBODY (ROUTINE TESTING W REFLEX): HIV Screen 4th Generation wRfx: NONREACTIVE

## 2020-11-26 MED ORDER — ATORVASTATIN CALCIUM 10 MG PO TABS
20.0000 mg | ORAL_TABLET | Freq: Every day | ORAL | Status: DC
  Administered 2020-11-26: 20 mg via ORAL
  Filled 2020-11-26: qty 2

## 2020-11-26 MED ORDER — ASPIRIN EC 81 MG PO TBEC
81.0000 mg | DELAYED_RELEASE_TABLET | Freq: Every day | ORAL | Status: DC
  Administered 2020-11-26: 81 mg via ORAL
  Filled 2020-11-26: qty 1

## 2020-11-26 MED ORDER — ASPIRIN 81 MG PO TBEC: 81.0000 mg | DELAYED_RELEASE_TABLET | Freq: Every day | ORAL | 1 refills | Status: AC

## 2020-11-26 MED ORDER — CLOPIDOGREL BISULFATE 300 MG PO TABS
300.0000 mg | ORAL_TABLET | Freq: Once | ORAL | Status: AC
  Administered 2020-11-26: 300 mg via ORAL
  Filled 2020-11-26: qty 1

## 2020-11-26 MED ORDER — CLOPIDOGREL BISULFATE 75 MG PO TABS: 75.0000 mg | ORAL_TABLET | Freq: Every day | ORAL | Status: DC

## 2020-11-26 MED ORDER — INSULIN ASPART 100 UNIT/ML IJ SOLN: 0.0000 [IU] | Freq: Three times a day (TID) | INTRAMUSCULAR | Status: DC

## 2020-11-26 MED ORDER — ACETAMINOPHEN 325 MG PO TABS: 650.0000 mg | ORAL_TABLET | ORAL | Status: DC | PRN

## 2020-11-26 MED ORDER — CLOPIDOGREL BISULFATE 75 MG PO TABS: 75.0000 mg | ORAL_TABLET | Freq: Every day | ORAL | 0 refills | Status: AC

## 2020-11-26 MED ORDER — ACETAMINOPHEN 160 MG/5ML PO SOLN: 650.0000 mg | ORAL | Status: DC | PRN

## 2020-11-26 MED ORDER — INSULIN GLARGINE 100 UNIT/ML ~~LOC~~ SOLN
30.0000 [IU] | Freq: Every day | SUBCUTANEOUS | Status: DC
  Filled 2020-11-26: qty 0.3

## 2020-11-26 MED ORDER — ATORVASTATIN CALCIUM 10 MG PO TABS: 20.0000 mg | ORAL_TABLET | Freq: Every day | ORAL | 0 refills | Status: DC

## 2020-11-26 MED ORDER — ACETAMINOPHEN 650 MG RE SUPP: 650.0000 mg | RECTAL | Status: DC | PRN

## 2020-11-26 MED ORDER — ENOXAPARIN SODIUM 40 MG/0.4ML IJ SOSY: 40.0000 mg | PREFILLED_SYRINGE | INTRAMUSCULAR | Status: DC

## 2020-11-26 MED ORDER — STROKE: EARLY STAGES OF RECOVERY BOOK
Freq: Once | Status: DC
  Filled 2020-11-26: qty 1

## 2020-11-26 MED ORDER — INSULIN ASPART 100 UNIT/ML IJ SOLN: 0.0000 [IU] | INTRAMUSCULAR | Status: DC

## 2020-11-26 MED ORDER — INSULIN ASPART 100 UNIT/ML IJ SOLN: 0.0000 [IU] | Freq: Every day | INTRAMUSCULAR | Status: DC

## 2020-11-26 NOTE — Evaluation (Signed)
Physical Therapy Evaluation Patient Details Name: [name] MRN: [id number] DOB: January 11, 1971 Today's Date: 11/26/2020   History of Present Illness  50 y.o. male presents to [name] ED on 11/25/2020 as a code stroke with R weakness. MRI demonstrates an acute R basal ganglia infarct. PMH includes hypertension, insulin-dependent type 2 diabetes, DVT, chronic venous insufficiency, CKD stage II, Bell's palsy.  Clinical Impression  Pt presents to PT mobilizing independently at this time. Pt reports strength and mobility have returned to baseline and the pt expresses no concerns currently about mobility at this time. PT encourages the patient to ambulate multiple times daily for the remainder of admission. Pt has no further acute PT needs at this time, acute PT signing off.    Follow Up Recommendations No PT follow up    Equipment Recommendations  None recommended by PT    Recommendations for Other Services       Precautions / Restrictions Precautions Precautions: None Restrictions Weight Bearing Restrictions: No      Mobility  Bed Mobility Overal bed mobility: Independent                  Transfers Overall transfer level: Independent                  Ambulation/Gait Ambulation/Gait assistance: Independent Gait Distance (Feet): 250 Feet Assistive device: None Gait Pattern/deviations: WFL(Within Functional Limits) Gait velocity: functional Gait velocity interpretation: >2.62 ft/sec, indicative of community ambulatory General Gait Details: pt ambulates with steady step-through gait, able to perform head turns, change stride length and gait speed, turn quickly, and pick up objects from the floor in a standing position  Stairs            Wheelchair Mobility    Modified Rankin (Stroke Patients Only) Modified Rankin (Stroke Patients Only) Pre-Morbid Rankin Score: No symptoms Modified Rankin: No symptoms     Balance Overall balance assessment:  Independent                                           Pertinent Vitals/Pain Pain Assessment: No/denies pain    Home Living Family/patient expects to be discharged to:: Other (Comment) (motel) Living Arrangements: Non-relatives/Friends (PRN, seems unsure) Available Help at Discharge: Friend(s);Available PRN/intermittently Type of Home:  (motel) Home Access: Level entry     Home Layout: One level Home Equipment: Cane - single point      Prior Function Level of Independence: Independent         Comments: works driving dump trucks for [employer]   Dominant Hand: Right    Extremity/Trunk Assessment   Upper Extremity Assessment Upper Extremity Assessment: Overall WFL for tasks assessed    Lower Extremity Assessment Lower Extremity Assessment: Overall WFL for tasks assessed    Cervical / Trunk Assessment Cervical / Trunk Assessment: Normal  Communication   Communication: No difficulties  Cognition Arousal/Alertness: Awake/alert Behavior During Therapy: WFL for tasks assessed/performed Overall Cognitive Status: Within Functional Limits for tasks assessed                                        General Comments General comments (skin integrity, edema, etc.): VSS on RA    Exercises     Assessment/Plan    PT Assessment Patent  does not need any further PT services  PT Problem List         PT Treatment Interventions      PT Goals (Current goals can be found in the Care Plan section)       Frequency     Barriers to discharge        Co-evaluation               AM-PAC PT "6 Clicks" Mobility  Outcome Measure Help needed turning from your back to your side while in a flat bed without using bedrails?: None Help needed moving from lying on your back to sitting on the side of a flat bed without using bedrails?: None Help needed moving to and from a bed to a chair (including a wheelchair)?: None Help  needed standing up from a chair using your arms (e.g., wheelchair or bedside chair)?: None Help needed to walk in hospital room?: None Help needed climbing 3-5 steps with a railing? : None 6 Click Score: 24    End of Session   Activity Tolerance: Patient tolerated treatment well Patient left: in bed;with call bell/phone within reach Nurse Communication: Mobility status      Time: 5035-4656 PT Time Calculation (min) (ACUTE ONLY): 10 min   Charges:   PT Evaluation $PT Eval Low Complexity: [place], PT, DPT Acute Rehabilitation Pager: [place] 11/26/2020, 11:10 AM

## 2020-11-26 NOTE — Discharge Instructions (Signed)
Ischemic Stroke An ischemic stroke (cerebrovascular accident, or CVA) is the sudden death of brain tissue that occurs when an area of the brain does not get enough blood flow. This condition is a medical emergency that must be treated right away. An ischemic stroke can cause permanent loss of brain function. Losing brain function can cause problems with how different parts of the body work. What are the causes? This condition is caused by a decrease of blood flow to an area of the brain, which may be the result of: A small blood clot (embolus) or a buildup of plaque in the blood vessels, called atherosclerosis, that blocks blood flow in the brain. An abnormal heart rhythm called atrial fibrillation, which sends a small blood clot to the brain. A blocked or damaged artery in the head or neck. Certain infections. Inflammation of the arteries in the brain (vasculitis). Sometimes, the cause of ischemic stroke is not known. What increases the risk? The following factors may make you more likely to develop this condition: Factors that you can change High blood pressure (hypertension) or certain other medical conditions, such as: Heart disease. Diabetes mellitus. High cholesterol. Obesity. Sleep apnea. Migraine headache. Smoking cigarettes or using other tobacco products. Physical inactivity. Heavy alcohol use. Use of illegal drugs, especially cocaine and methamphetamine. Taking birth control pills, especially if you also use tobacco. Factors that you cannot change Being older than age 60. History of blood clots, stroke, or mini-stroke (transient ischemic attack, TIA). History of high blood pressure when pregnant (preeclampsia), in women. Family history of stroke. Sickle cell disease. Blood clotting disorders (hypercoagulable state). What are the signs or symptoms? Symptoms of this condition usually develop suddenly, or you may notice them after waking from sleep. These sudden symptoms may  include: Weakness or numbness of your face, arm, or leg, especially on one side of your body. Loss of balance or coordination. Slurred speech, or aphasia. Aphasia is trouble speaking or trouble understanding speech or both. Vision changes in one or both eyes. You may have double vision, blurred vision, or loss of vision. Dizziness or confusion. Nausea and vomiting. Severe headache with no known cause. If possible, write down the exact time that you last felt like your normal self and what time your symptoms started. Tell your health care provider. If symptoms come and go, they could be signs of a TIA (transient ischemic attack). Get help right away, even if you feel better. How is this diagnosed? This condition may be diagnosed based on: Your symptoms, your medical history, and a physical exam. CT scan of the brain. MRI. Imaging tests that scan blood flow (circulation) in the brain. These may be CT angiogram, MRI angiogram, or cerebral angiogram. You may need to see a health care provider who specializes in stroke care. A stroke specialist can be seen in person or through communication using telephone or television technology (telemedicine). You may also have other tests, including: Electrocardiogram (ECG). Continuous heart monitoring. Transthoracic echocardiogram (TTE). Transesophageal echocardiogram (TEE). Carotid ultrasound. Blood tests. Sleep study to check for sleep apnea. How is this treated? Treatment for this condition depends on the duration, severity, and cause of your symptoms and on the area of the brain affected. It is very important to get treatment at the first sign of stroke symptoms. Some treatments work better if they are done within 3-6 hours of the start of stroke symptoms. These initial treatments may include: Thrombolytic medicine that is injected to dissolve the blood clot. Treatments given directly   to the affected artery to remove or dissolve the blood  clot. Medicines to control blood pressure. Anticoagulant or antiplatelet medicines to thin the blood. Other treatments may include: Oxygen. IV fluids. Procedures to increase blood flow. Medicines and diet changes may be used to help manage risk factors for stroke, such as diabetes, high cholesterol, and high blood pressure. After a stroke, you may work with physical, speech, mental health, or occupational therapists to help you recover. Follow these instructions at home: Medicines Take over-the-counter and prescription medicines only as told by your health care provider. If you were told to take a medicine to thin your blood, take your medicine exactly as told, at the same time every day. This includes aspirin or an anticoagulant. Taking too much blood-thinning medicine can cause bleeding. If you do not take enough blood-thinning medicine, you will not be protected enough against another stroke and other problems. Understand the side effects of taking anticoagulant medicine. When taking this type of medicine, make sure you: Hold pressure over any cuts for longer than usual. Tell your dentist and other health care providers that you are taking anticoagulants before you have any procedures that may cause bleeding. Avoid activities that could cause injury or bruising. Wear a medical alert bracelet or carry a card that lists what medicines you take. Eating and drinking Follow instructions from your health care provider about diet. Eat healthy foods. If your stroke affected your ability to swallow, you may need to take steps to avoid choking. These may include taking small bites when eating and eating foods that are soft or pureed. Safety Follow instructions from your health care team about physical activity. Use a walker or cane as told by your health care provider. Take steps to create a safe home environment to lower your risk of falls. These steps may include: Having your home looked at by  specialists. Installing grab bars in the bedroom and bathroom. Using safety equipment, such as raised toilets and a seat in the shower. General instructions Do not use any products that contain nicotine or tobacco, such as cigarettes, e-cigarettes, and chewing tobacco. If you need help quitting, ask your health care provider. If you drink alcohol: Limit how much you use to: 0-1 drink a day for women. 0-2 drinks a day for men. Be aware of how much alcohol is in your drink. In the [place] one drink equals one 12 oz bottle of beer (355 mL), one 5 oz glass of wine (148 mL), or one 1 oz glass of hard liquor (44 mL). If you need help to stop using drugs or alcohol, ask your health care provider about a referral to a program or specialist. Maintain an active and healthy lifestyle. Get regular exercise as told. Wear a medical bracelet as told by your health care provider. Keep all follow-up visits as told by your health care provider, including visits with all specialists on your health care team. This is important. How is this prevented? You can lower your risk of another stroke by managing high blood pressure, high cholesterol, diabetes, heart disease, sleep apnea, and obesity. Your risk can also be lowered by quitting smoking, limiting alcohol, and staying physically active. Your health care provider will continue to help you with ways to prevent short-term and long-term problems caused by stroke. Get help right away if: You have any symptoms of a stroke. "BE FAST" is an easy way to remember the main warning signs of a stroke: B - Balance. Signs are dizziness,   sudden trouble walking, or loss of balance. E - Eyes. Signs are trouble seeing or a sudden change in vision. F - Face. Signs are sudden weakness or numbness of the face, or the face or eyelid drooping on one side. A - Arms. Signs are weakness or numbness in an arm. This happens suddenly and usually on one side of the body. S - Speech. Signs  are sudden trouble speaking, slurred speech, or trouble understanding what people say. T - Time. Time to call emergency services. Write down what time symptoms started. You have other signs of a stroke, such as: A sudden, severe headache with no known cause. Nausea or vomiting. Seizure. These symptoms may represent a serious problem that is an emergency. Do not wait to see if the symptoms will go away. Get medical help right away. Call your local emergency services (911 in the [place] Do not drive yourself to the hospital. Summary An ischemic stroke (cerebrovascular accident, or CVA) is the sudden death of brain tissue that occurs when an area of the brain does not get enough blood flow. Symptoms of this condition usually develop suddenly, or you may notice them after waking from sleep. It is very important to get treatment at the first sign of stroke symptoms. Stroke is a medical emergency that must be treated right away. This information is not intended to replace advice given to you by your health care provider. Make sure you discuss any questions you have with your health care provider. Document Revised: 06/29/2019 Document Reviewed: 06/29/2019 [employer] Patient Education  2021 [employer]  

## 2020-11-26 NOTE — H&P (Signed)
History and Physical    [name] [id number] DOB: 1970/08/17 DOA: 11/25/2020  PCP: Tracie Harrier, MD Patient coming from: Home  Chief Complaint: Right-sided weakness  HPI: [name] is a 50 y.o. male with medical history significant of hypertension, insulin-dependent type 2 diabetes, DVT, chronic venous insufficiency, CKD stage II, Bell's palsy presented to the ED via EMS as code stroke for evaluation of right-sided weakness, last known well at 11 AM.  Seen by neurology and out of tPA window.  Head CT negative for acute finding.  Brain MRI showing an acute posterior right basal ganglia infarct.  No associated hemorrhage or mass-effect.  MRA head and neck normal. Labs in the ED showing WBC 5.1, hemoglobin 15.2, platelet count 319K.  Sodium 137, potassium 4.2, chloride 104, bicarb 24, BUN 16, creatinine 1.2 (at baseline), glucose 93.  INR 1.0.  Blood ethanol level undetectable.  UA and UDS pending.  Screening COVID and influenza PCR negative.  Patient states he works third shift and went to bed around 11 AM this morning.  He then woke up at 6:45 PM and noticed that his right arm and leg were weak.  He was also having slurred speech at that time.  He checked his blood sugar and it was 55 and he was drenched in sweat.  He had cereal around 7 AM in the morning and took his insulin at that time.  Reports a similar episode of right-sided weakness a month ago but he did not seek medical attention at that time.  Also reports right-sided facial droop for the past 2 months and states he was diagnosed with Bell's palsy.  Denies history of prior strokes.  No other complaints.  Denies fevers, cough, shortness of breath, chest pain, nausea, vomiting, abdominal pain, or diarrhea.  Review of Systems:  All systems reviewed and apart from history of presenting illness, are negative.  Past Medical History:  Diagnosis Date  . Acute blood loss as cause of postoperative anemia 04/18/2017  . ARF  (acute renal failure) ([place])   . Cellulitis of right lower leg 03/31/2017  . Chronic anticoagulation 03/31/2017  . Diabetes mellitus without complication ([place])   . Diabetic ulcer of calf ([place]) 03/31/2017  . DVT of proximal lower limb ([place]) 03/20/2017   R calf area  . Escherichia coli sepsis ([place]) 04/18/2017  . Gout   . History of hiatal hernia   . Hypertension   . Hypoalbuminemia 03/31/2017  . Necrotizing fasciitis ([place])   . Right leg DVT ([place]) 03/31/2017  . Sepsis ([place]) 03/30/2017  . Sinus tachycardia 03/31/2017  . Type 2 diabetes mellitus with stage 2 chronic kidney disease, with long-term current use of insulin ([place]) 03/27/2017    Past Surgical History:  Procedure Laterality Date  . COLONOSCOPY WITH PROPOFOL N/A 02/10/2018   Procedure: COLONOSCOPY WITH PROPOFOL;  Surgeon: Lin Landsman, MD;  Location: [facility];  Service: Gastroenterology;  Laterality: N/A;  . ELBOW SURGERY Right    DUE TO INFECTION  . ESOPHAGOGASTRODUODENOSCOPY (EGD) WITH PROPOFOL N/A 02/10/2018   Procedure: ESOPHAGOGASTRODUODENOSCOPY (EGD) WITH PROPOFOL;  Surgeon: Lin Landsman, MD;  Location: [place];  Service: Gastroenterology;  Laterality: N/A;  . ESOPHAGOGASTRODUODENOSCOPY (EGD) WITH PROPOFOL N/A 04/14/2020   Procedure: ESOPHAGOGASTRODUODENOSCOPY (EGD) WITH PROPOFOL;  Surgeon: Lin Landsman, MD;  Location: [place];  Service: Endoscopy;  Laterality: N/A;  diabetic - insulin  . I & D EXTREMITY Right 04/03/2017   Procedure: IRRIGATION AND DEBRIDEMENT RIGHT LEG, APPLY WOUND VAC;  Surgeon: Newt Minion, MD;  Location: [place];  Service: Orthopedics;  Laterality: Right;  . I & D EXTREMITY Right 04/05/2017   Procedure: IRRIGATION AND DEBRIDEMENT RIGHT LEG;  Surgeon: Newt Minion, MD;  Location: [place];  Service: Orthopedics;  Laterality: Right;  . IRRIGATION AND DEBRIDEMENT ABSCESS Right 12/30/2018   Procedure: MINOR INCISION AND DRAINAGE OF ABSCESS;  Surgeon: Verner Mould, MD;   Location: [facility];  Service: Orthopedics;  Laterality: Right;  . SCROTAL SURGERY  2006  . SKIN SPLIT GRAFT Right 04/10/2017   Procedure: SKIN GRAFT SPLIT THICKNESS RIGHT LEG;  Surgeon: Newt Minion, MD;  Location: [place];  Service: Orthopedics;  Laterality: Right;  . XI ROBOTIC ASSISTED INGUINAL HERNIA REPAIR WITH MESH Bilateral 09/01/2020   Procedure: XI ROBOTIC ASSISTED INGUINAL HERNIA REPAIR WITH MESH;  Surgeon: Benjamine Sprague, DO;  Location: [facility];  Service: General;  Laterality: Bilateral;     reports that he has never smoked. He has never used smokeless tobacco. He reports that he does not drink alcohol and does not use drugs.  No Known Allergies  Family History  Problem Relation Age of Onset  . Hypertension Mother   . Stroke Mother   . Diabetes Father   . Renal Disease Father   . Hypertension Sister   . Diabetes Sister   . Renal Disease Sister   . Iron deficiency Sister     Prior to Admission medications   Medication Sig Start Date End Date Taking? Authorizing Provider  ACCU-CHEK FASTCLIX LANCETS MISC U UTD TO CHECK BLOOD SUGAR TID 01/13/18  Yes [provider]  ACCU-CHEK GUIDE test strip USE TO TEST QID UTD 12/13/17  Yes [provider]  amLODipine (NORVASC) 10 MG tablet Take 10 mg by mouth every morning.   Yes [provider]  glucose blood test strip Use 4 (four) times daily Use as instructed. 11/13/17  Yes [provider]  hydrALAZINE (APRESOLINE) 100 MG tablet Take 100 mg by mouth 3 (three) times daily. 12/25/19  Yes [provider]  hydrochlorothiazide (HYDRODIURIL) 25 MG tablet Take 25 mg by mouth every morning. 05/08/18  Yes [provider]  HYDROcodone-acetaminophen (NORCO) 5-325 MG tablet Take 1 tablet by mouth every 6 (six) hours as needed for up to 6 doses for moderate pain. 09/01/20  Yes Sakai, Isami, DO  ibuprofen (ADVIL) 800 MG tablet Take 1 tablet (800 mg total) by mouth every 8 (eight) hours as needed for mild  pain or moderate pain. 09/01/20  Yes Sakai, Isami, DO  insulin aspart (NOVOLOG) 100 UNIT/ML FlexPen Inject 10-15 Units into the skin 4 (four) times daily. Sliding scale   Yes [provider]  insulin glargine (LANTUS) 100 UNIT/ML injection Inject 30 Units into the skin in the morning.    Yes [provider]  lisinopril (PRINIVIL,ZESTRIL) 40 MG tablet Take 40 mg by mouth every morning. 05/09/18  Yes [provider]  metoprolol tartrate (LOPRESSOR) 100 MG tablet Take 1 tablet (100 mg total) by mouth 2 (two) times daily. 04/12/17  Yes Hosie Poisson, MD    Physical Exam: Vitals:   11/25/20 2252 11/25/20 2300 11/25/20 2345 11/26/20 0146  BP:  (!) 142/94 134/86 133/85  Pulse: (!) 58 61 62 65  Resp: 12 10 13 12   Temp:    97.9 F (36.6 C)  TempSrc:    Oral  SpO2: 94% 100% 97% 96%  Weight:      Height:  Physical Exam Constitutional:      General: He is not in acute distress. HENT:     Head: Normocephalic and atraumatic.  Eyes:     Extraocular Movements: Extraocular movements intact.     Conjunctiva/sclera: Conjunctivae normal.  Cardiovascular:     Rate and Rhythm: Normal rate and regular rhythm.     Pulses: Normal pulses.  Pulmonary:     Effort: Pulmonary effort is normal.     Breath sounds: Normal breath sounds.  Abdominal:     General: Bowel sounds are normal. There is no distension.     Palpations: Abdomen is soft.     Tenderness: There is no abdominal tenderness.  Musculoskeletal:     Cervical back: Normal range of motion and neck supple.  Skin:    General: Skin is warm and dry.  Neurological:     General: No focal deficit present.     Mental Status: He is alert and oriented to person, place, and time.     Cranial Nerves: Cranial nerve deficit present.     Sensory: No sensory deficit.     Motor: No weakness.     Comments: Right-sided facial droop     Labs on Admission: I have personally reviewed following labs and imaging  studies  CBC: Recent Labs  Lab 11/25/20 2026 11/25/20 2039  WBC 5.1  --   NEUTROABS 3.8  --   HGB 15.2 16.3  HCT 47.6 48.0  MCV 93.0  --   PLT 319  --    Basic Metabolic Panel: Recent Labs  Lab 11/25/20 2026 11/25/20 2039  NA 137 140  K 4.2 4.3  CL 104 105  CO2 24  --   GLUCOSE 93 92  BUN 16 18  CREATININE 1.29* 1.40*  CALCIUM 9.3  --    GFR: Estimated Creatinine Clearance: 80.3 mL/min (A) (by C-G formula based on SCr of 1.4 mg/dL (H)). Liver Function Tests: Recent Labs  Lab 11/25/20 2026  AST 28  ALT 16  ALKPHOS 63  BILITOT 0.5  PROT 7.4  ALBUMIN 3.6   No results for input(s): LIPASE, AMYLASE in the last 168 hours. No results for input(s): AMMONIA in the last 168 hours. Coagulation Profile: Recent Labs  Lab 11/25/20 2026  INR 1.0   Cardiac Enzymes: No results for input(s): CKTOTAL, CKMB, CKMBINDEX, TROPONINI in the last 168 hours. BNP (last 3 results) No results for input(s): PROBNP in the last 8760 hours. HbA1C: No results for input(s): HGBA1C in the last 72 hours. CBG: Recent Labs  Lab 11/25/20 2026  GLUCAP 78   Lipid Profile: No results for input(s): CHOL, HDL, LDLCALC, TRIG, CHOLHDL, LDLDIRECT in the last 72 hours. Thyroid Function Tests: No results for input(s): TSH, T4TOTAL, FREET4, T3FREE, THYROIDAB in the last 72 hours. Anemia Panel: No results for input(s): VITAMINB12, FOLATE, FERRITIN, TIBC, IRON, RETICCTPCT in the last 72 hours. Urine analysis:    Component Value Date/Time   COLORURINE YELLOW 12/29/2019 [place] 12/29/2019 0655   LABSPEC 1.016 12/29/2019 0655   PHURINE 7.0 12/29/2019 0655   GLUCOSEU NEGATIVE 12/29/2019 0655   HGBUR NEGATIVE 12/29/2019 0655   BILIRUBINUR NEGATIVE 12/29/2019 0655   [place] 12/29/2019 0655   PROTEINUR 30 (A) 12/29/2019 0655   NITRITE NEGATIVE 12/29/2019 0655   LEUKOCYTESUR NEGATIVE 12/29/2019 0655    Radiological Exams on Admission: MR ANGIO HEAD WO  CONTRAST  Result Date: 11/25/2020 CLINICAL DATA:  Initial evaluation for acute stroke. Slurred speech, right-sided weakness. EXAM:  MRI HEAD WITHOUT CONTRAST MRA HEAD WITHOUT CONTRAST MRA NECK WITHOUT CONTRAST TECHNIQUE: Multiplanar, multiecho pulse sequences of the brain and surrounding structures were obtained without intravenous contrast. Angiographic images of the Circle of Willis were obtained using MRA technique without intravenous contrast. Angiographic images of the neck were obtained using MRA technique without intravenous contrast. Carotid stenosis measurements (when applicable) are obtained utilizing NASCET criteria, using the distal internal carotid diameter as the denominator. COMPARISON:  Previous head CT from earlier the same day. FINDINGS: MRI HEAD FINDINGS Brain: Cerebral volume within normal limits. No significant cerebral white matter disease. 1 cm focus of curvilinear restricted diffusion seen involving the posterior right basal ganglia, consistent with an acute perforator type infarct (series 5, image 82). Associated T2/FLAIR signal abnormality. No associated hemorrhage or mass effect. No other evidence for acute or subacute ischemia. Gray-white matter differentiation otherwise maintained. No areas of remote cortical infarction. No evidence for acute or chronic intracranial hemorrhage. No mass lesion, midline shift or mass effect. No hydrocephalus or extra-axial fluid collection. Pituitary gland suprasellar region within normal limits. Midline structures intact. Vascular: Major intracranial vascular flow voids are maintained. Skull and upper cervical spine: Craniocervical junction normal. Bone marrow signal intensity within normal limits. No scalp soft tissue abnormality. Sinuses/Orbits: Globes and orbital soft tissues within normal limits. Retention cyst noted within the maxillary sinuses bilaterally. Paranasal sinuses are otherwise clear. No mastoid effusion. Inner ear structures grossly  normal. Other: None. MRA HEAD FINDINGS ANTERIOR CIRCULATION: Visualized distal cervical segments of the internal carotid arteries are patent with antegrade flow. Petrous, cavernous, and supraclinoid segments widely patent without stenosis or other abnormality. A1 segments widely patent. Normal anterior communicating artery complex. Anterior cerebral arteries patent to their distal aspects without stenosis. No M1 stenosis or occlusion. Normal MCA bifurcations. Distal MCA branches well perfused and symmetric. POSTERIOR CIRCULATION: Both V4 segments patent to the vertebrobasilar junction without stenosis. Right vertebral artery slightly dominant. Both PICA origins patent and normal. Basilar widely patent to its distal aspect without stenosis. Superior cerebellar arteries patent bilaterally. PCAs supplied via the basilar as well as small bilateral posterior communicating arteries. Both PCAs well perfused to their distal aspects without stenosis. No intracranial aneurysm. MRA NECK FINDINGS AORTIC ARCH: Examination limited by lack of IV contrast and motion artifact. Visualized aortic arch normal caliber with normal 3 vessel morphology. No hemodynamically significant stenosis seen about the origin of the great vessels. RIGHT CAROTID SYSTEM: Visualized right CCA patent without stenosis. No significant atheromatous narrowing about the right bifurcation. Right ICA patent distally without stenosis, evidence for dissection or occlusion. LEFT CAROTID SYSTEM: Visualized left CCA patent from its origin to the bifurcation without stenosis. No significant atheromatous narrowing about the left bifurcation. Left ICA patent distally without stenosis, evidence for dissection or occlusion. VERTEBRAL ARTERIES: Both vertebral arteries appear to arise from the subclavian arteries. Origins not well assessed on this somewhat limited exam. Right vertebral artery dominant. Visualized portions of the vertebral arteries patent without stenosis,  evidence for dissection or occlusion. IMPRESSION: MRI HEAD: 1. 1 cm acute perforator type infarct involving the posterior right basal ganglia. No associated hemorrhage or mass effect. 2. Otherwise normal brain MRI. MRA HEAD: Normal intracranial MRA. No large vessel occlusion, hemodynamically significant stenosis, or other acute vascular abnormality. MRA NECK: Normal MRA of the neck. No hemodynamically significant or critical flow limiting stenosis. Electronically Signed   By: Jeannine Boga M.D.   On: 11/25/2020 23:07   MR ANGIO NECK WO CONTRAST  Result Date: 11/25/2020 CLINICAL  DATA:  Initial evaluation for acute stroke. Slurred speech, right-sided weakness. EXAM: MRI HEAD WITHOUT CONTRAST MRA HEAD WITHOUT CONTRAST MRA NECK WITHOUT CONTRAST TECHNIQUE: Multiplanar, multiecho pulse sequences of the brain and surrounding structures were obtained without intravenous contrast. Angiographic images of the Circle of Willis were obtained using MRA technique without intravenous contrast. Angiographic images of the neck were obtained using MRA technique without intravenous contrast. Carotid stenosis measurements (when applicable) are obtained utilizing NASCET criteria, using the distal internal carotid diameter as the denominator. COMPARISON:  Previous head CT from earlier the same day. FINDINGS: MRI HEAD FINDINGS Brain: Cerebral volume within normal limits. No significant cerebral white matter disease. 1 cm focus of curvilinear restricted diffusion seen involving the posterior right basal ganglia, consistent with an acute perforator type infarct (series 5, image 82). Associated T2/FLAIR signal abnormality. No associated hemorrhage or mass effect. No other evidence for acute or subacute ischemia. Gray-white matter differentiation otherwise maintained. No areas of remote cortical infarction. No evidence for acute or chronic intracranial hemorrhage. No mass lesion, midline shift or mass effect. No hydrocephalus or  extra-axial fluid collection. Pituitary gland suprasellar region within normal limits. Midline structures intact. Vascular: Major intracranial vascular flow voids are maintained. Skull and upper cervical spine: Craniocervical junction normal. Bone marrow signal intensity within normal limits. No scalp soft tissue abnormality. Sinuses/Orbits: Globes and orbital soft tissues within normal limits. Retention cyst noted within the maxillary sinuses bilaterally. Paranasal sinuses are otherwise clear. No mastoid effusion. Inner ear structures grossly normal. Other: None. MRA HEAD FINDINGS ANTERIOR CIRCULATION: Visualized distal cervical segments of the internal carotid arteries are patent with antegrade flow. Petrous, cavernous, and supraclinoid segments widely patent without stenosis or other abnormality. A1 segments widely patent. Normal anterior communicating artery complex. Anterior cerebral arteries patent to their distal aspects without stenosis. No M1 stenosis or occlusion. Normal MCA bifurcations. Distal MCA branches well perfused and symmetric. POSTERIOR CIRCULATION: Both V4 segments patent to the vertebrobasilar junction without stenosis. Right vertebral artery slightly dominant. Both PICA origins patent and normal. Basilar widely patent to its distal aspect without stenosis. Superior cerebellar arteries patent bilaterally. PCAs supplied via the basilar as well as small bilateral posterior communicating arteries. Both PCAs well perfused to their distal aspects without stenosis. No intracranial aneurysm. MRA NECK FINDINGS AORTIC ARCH: Examination limited by lack of IV contrast and motion artifact. Visualized aortic arch normal caliber with normal 3 vessel morphology. No hemodynamically significant stenosis seen about the origin of the great vessels. RIGHT CAROTID SYSTEM: Visualized right CCA patent without stenosis. No significant atheromatous narrowing about the right bifurcation. Right ICA patent distally  without stenosis, evidence for dissection or occlusion. LEFT CAROTID SYSTEM: Visualized left CCA patent from its origin to the bifurcation without stenosis. No significant atheromatous narrowing about the left bifurcation. Left ICA patent distally without stenosis, evidence for dissection or occlusion. VERTEBRAL ARTERIES: Both vertebral arteries appear to arise from the subclavian arteries. Origins not well assessed on this somewhat limited exam. Right vertebral artery dominant. Visualized portions of the vertebral arteries patent without stenosis, evidence for dissection or occlusion. IMPRESSION: MRI HEAD: 1. 1 cm acute perforator type infarct involving the posterior right basal ganglia. No associated hemorrhage or mass effect. 2. Otherwise normal brain MRI. MRA HEAD: Normal intracranial MRA. No large vessel occlusion, hemodynamically significant stenosis, or other acute vascular abnormality. MRA NECK: Normal MRA of the neck. No hemodynamically significant or critical flow limiting stenosis. Electronically Signed   By: Jeannine Boga M.D.   On: 11/25/2020 23:07  MR BRAIN WO CONTRAST  Result Date: 11/25/2020 CLINICAL DATA:  Initial evaluation for acute stroke. Slurred speech, right-sided weakness. EXAM: MRI HEAD WITHOUT CONTRAST MRA HEAD WITHOUT CONTRAST MRA NECK WITHOUT CONTRAST TECHNIQUE: Multiplanar, multiecho pulse sequences of the brain and surrounding structures were obtained without intravenous contrast. Angiographic images of the Circle of Willis were obtained using MRA technique without intravenous contrast. Angiographic images of the neck were obtained using MRA technique without intravenous contrast. Carotid stenosis measurements (when applicable) are obtained utilizing NASCET criteria, using the distal internal carotid diameter as the denominator. COMPARISON:  Previous head CT from earlier the same day. FINDINGS: MRI HEAD FINDINGS Brain: Cerebral volume within normal limits. No significant  cerebral white matter disease. 1 cm focus of curvilinear restricted diffusion seen involving the posterior right basal ganglia, consistent with an acute perforator type infarct (series 5, image 82). Associated T2/FLAIR signal abnormality. No associated hemorrhage or mass effect. No other evidence for acute or subacute ischemia. Gray-white matter differentiation otherwise maintained. No areas of remote cortical infarction. No evidence for acute or chronic intracranial hemorrhage. No mass lesion, midline shift or mass effect. No hydrocephalus or extra-axial fluid collection. Pituitary gland suprasellar region within normal limits. Midline structures intact. Vascular: Major intracranial vascular flow voids are maintained. Skull and upper cervical spine: Craniocervical junction normal. Bone marrow signal intensity within normal limits. No scalp soft tissue abnormality. Sinuses/Orbits: Globes and orbital soft tissues within normal limits. Retention cyst noted within the maxillary sinuses bilaterally. Paranasal sinuses are otherwise clear. No mastoid effusion. Inner ear structures grossly normal. Other: None. MRA HEAD FINDINGS ANTERIOR CIRCULATION: Visualized distal cervical segments of the internal carotid arteries are patent with antegrade flow. Petrous, cavernous, and supraclinoid segments widely patent without stenosis or other abnormality. A1 segments widely patent. Normal anterior communicating artery complex. Anterior cerebral arteries patent to their distal aspects without stenosis. No M1 stenosis or occlusion. Normal MCA bifurcations. Distal MCA branches well perfused and symmetric. POSTERIOR CIRCULATION: Both V4 segments patent to the vertebrobasilar junction without stenosis. Right vertebral artery slightly dominant. Both PICA origins patent and normal. Basilar widely patent to its distal aspect without stenosis. Superior cerebellar arteries patent bilaterally. PCAs supplied via the basilar as well as small  bilateral posterior communicating arteries. Both PCAs well perfused to their distal aspects without stenosis. No intracranial aneurysm. MRA NECK FINDINGS AORTIC ARCH: Examination limited by lack of IV contrast and motion artifact. Visualized aortic arch normal caliber with normal 3 vessel morphology. No hemodynamically significant stenosis seen about the origin of the great vessels. RIGHT CAROTID SYSTEM: Visualized right CCA patent without stenosis. No significant atheromatous narrowing about the right bifurcation. Right ICA patent distally without stenosis, evidence for dissection or occlusion. LEFT CAROTID SYSTEM: Visualized left CCA patent from its origin to the bifurcation without stenosis. No significant atheromatous narrowing about the left bifurcation. Left ICA patent distally without stenosis, evidence for dissection or occlusion. VERTEBRAL ARTERIES: Both vertebral arteries appear to arise from the subclavian arteries. Origins not well assessed on this somewhat limited exam. Right vertebral artery dominant. Visualized portions of the vertebral arteries patent without stenosis, evidence for dissection or occlusion. IMPRESSION: MRI HEAD: 1. 1 cm acute perforator type infarct involving the posterior right basal ganglia. No associated hemorrhage or mass effect. 2. Otherwise normal brain MRI. MRA HEAD: Normal intracranial MRA. No large vessel occlusion, hemodynamically significant stenosis, or other acute vascular abnormality. MRA NECK: Normal MRA of the neck. No hemodynamically significant or critical flow limiting stenosis. Electronically Signed   By:  Jeannine Boga M.D.   On: 11/25/2020 23:07   CT HEAD CODE STROKE WO CONTRAST  Result Date: 11/25/2020 CLINICAL DATA:  Code stroke. Initial evaluation for acute right-sided weakness, right facial droop. EXAM: CT HEAD WITHOUT CONTRAST TECHNIQUE: Contiguous axial images were obtained from the base of the skull through the vertex without intravenous  contrast. COMPARISON:  None. FINDINGS: Brain: Cerebral volume within normal limits for age. No acute intracranial hemorrhage. No acute large vessel territory infarct. No mass lesion, midline shift or mass effect. No hydrocephalus or extra-axial fluid collection. Vascular: No appreciable hyperdense vessel. Skull: Scalp soft tissues and calvarium within normal limits. Sinuses/Orbits: Globes orbital soft tissues are normal. Bilateral maxillary sinus retention cyst noted. Paranasal sinuses are otherwise clear. No mastoid effusion. Other: None. ASPECTS [facility] Stroke Program Early CT Score) - Ganglionic level infarction (caudate, lentiform nuclei, internal capsule, insula, M1-M3 cortex): 7 - Supraganglionic infarction (M4-M6 cortex): 3 Total score (0-10 with 10 being normal): 10 IMPRESSION: 1. Negative head CT.  No acute intracranial abnormality. 2. ASPECTS is 10. These results were communicated to Dr. Leonel Ramsay at 8:46 pmon 5/13/2022by text page via the [facility] messaging system. Electronically Signed   By: Jeannine Boga M.D.   On: 11/25/2020 20:47    EKG: Independently reviewed.  Sinus rhythm.  Assessment/Plan Principal Problem:   Acute CVA (cerebrovascular accident) [facility]) Active Problems:   Essential hypertension   Type 2 diabetes mellitus with stage 2 chronic kidney disease, with long-term current use of insulin (HCC)   CKD (chronic kidney disease)   Acute CVA Out of tPA window at the time of arrival to the ED. Head CT negative for acute finding.  Brain MRI showing an acute posterior right basal ganglia infarct.  No associated hemorrhage or mass-effect.  MRA head and neck normal. -Neurology following, appreciate recommendations -Telemetry monitoring -Carotid Dopplers -2D echocardiogram -Hemoglobin A1c, fasting lipid panel -Neurology recommending dual antiplatelet therapy with aspirin 81 mg daily and Plavix 75 mg daily after a 300 mg loading dose. -Frequent neurochecks -PT, OT, speech  therapy. -N.p.o. until cleared by bedside swallow evaluation or formal speech evaluation  Hypertension -Allow permissive hypertension at this time  Hypoglycemia Insulin-dependent type 2 diabetes Patient reports hypoglycemic episode at home with blood glucose 55.  Blood glucose 93 on metabolic panel. -Check H8N.  Hold home basal insulin at this time.  Very sensitive sliding scale insulin every 4 hours.  CKD stage II Stable.  Creatinine at baseline.  DVT prophylaxis: Lovenox Code Status: Full code Family Communication: No family available at this time. Disposition Plan: Status is: Inpatient  Remains inpatient appropriate because:Ongoing diagnostic testing needed not appropriate for outpatient work up and Inpatient level of care appropriate due to severity of illness   Dispo: The patient is from: Home              Anticipated d/c is to: Home              Patient currently is not medically stable to d/c.   Difficult to place patient No   Level of care: Level of care: Telemetry Medical   The medical decision making on this patient was of high complexity and the patient is at high risk for clinical deterioration, therefore this is a level 3 visit.  Shela Leff MD [facility]  If 7PM-7AM, please contact night-coverage [email or web address]  11/26/2020, 4:00 AM

## 2020-11-26 NOTE — Progress Notes (Signed)
*  PRELIMINARY RESULTS* Echocardiogram 2D Echocardiogram has been performed.  [name] 11/26/2020, 12:59 PM

## 2020-11-26 NOTE — Progress Notes (Signed)
Carotid duplex has been completed.  Results can be found under chart review under CV PROC. 11/26/2020 4:11 PM Khyler Eschmann RVT, RDMS

## 2020-11-26 NOTE — ED Notes (Signed)
Urine requested  ?

## 2020-11-26 NOTE — Progress Notes (Addendum)
STROKE TEAM PROGRESS NOTE   INTERVAL HISTORY No one is at the bedside.     Vitals:   11/26/20 1030 11/26/20 1045 11/26/20 1115 11/26/20 1130  BP:   (!) 153/95 (!) 170/98  Pulse: 73 76 77 77  Resp: 14 17 (!) 6 15  Temp:      TempSrc:      SpO2: 96% 93% 96% 97%  Weight:      Height:       CBC:  Recent Labs  Lab 11/25/20 2026 11/25/20 2039  WBC 5.1  --   NEUTROABS 3.8  --   HGB 15.2 16.3  HCT 47.6 48.0  MCV 93.0  --   PLT 319  --    Basic Metabolic Panel:  Recent Labs  Lab 11/25/20 2026 11/25/20 2039  NA 137 140  K 4.2 4.3  CL 104 105  CO2 24  --   GLUCOSE 93 92  BUN 16 18  CREATININE 1.29* 1.40*  CALCIUM 9.3  --     Lipid Panel:  Recent Labs  Lab 11/26/20 0350  CHOL 109  TRIG 40  HDL 50  CHOLHDL 2.2  VLDL 8  LDLCALC 51    HgbA1c:  Recent Labs  Lab 11/26/20 0350  HGBA1C 5.8*   Urine Drug Screen:  Recent Labs  Lab 11/26/20 1050  LABOPIA NONE DETECTED  COCAINSCRNUR NONE DETECTED  LABBENZ NONE DETECTED  AMPHETMU NONE DETECTED  THCU NONE DETECTED  LABBARB NONE DETECTED    Alcohol Level  Recent Labs  Lab 11/25/20 2026  ETH <10    IMAGING past 24 hours MR ANGIO HEAD WO CONTRAST  Result Date: 11/25/2020 CLINICAL DATA:  Initial evaluation for acute stroke. Slurred speech, right-sided weakness. EXAM: MRI HEAD WITHOUT CONTRAST MRA HEAD WITHOUT CONTRAST MRA NECK WITHOUT CONTRAST TECHNIQUE: Multiplanar, multiecho pulse sequences of the brain and surrounding structures were obtained without intravenous contrast. Angiographic images of the Circle of Willis were obtained using MRA technique without intravenous contrast. Angiographic images of the neck were obtained using MRA technique without intravenous contrast. Carotid stenosis measurements (when applicable) are obtained utilizing NASCET criteria, using the distal internal carotid diameter as the denominator. COMPARISON:  Previous head CT from earlier the same day. FINDINGS: MRI HEAD FINDINGS Brain:  Cerebral volume within normal limits. No significant cerebral white matter disease. 1 cm focus of curvilinear restricted diffusion seen involving the posterior right basal ganglia, consistent with an acute perforator type infarct (series 5, image 82). Associated T2/FLAIR signal abnormality. No associated hemorrhage or mass effect. No other evidence for acute or subacute ischemia. Gray-white matter differentiation otherwise maintained. No areas of remote cortical infarction. No evidence for acute or chronic intracranial hemorrhage. No mass lesion, midline shift or mass effect. No hydrocephalus or extra-axial fluid collection. Pituitary gland suprasellar region within normal limits. Midline structures intact. Vascular: Major intracranial vascular flow voids are maintained. Skull and upper cervical spine: Craniocervical junction normal. Bone marrow signal intensity within normal limits. No scalp soft tissue abnormality. Sinuses/Orbits: Globes and orbital soft tissues within normal limits. Retention cyst noted within the maxillary sinuses bilaterally. Paranasal sinuses are otherwise clear. No mastoid effusion. Inner ear structures grossly normal. Other: None. MRA HEAD FINDINGS ANTERIOR CIRCULATION: Visualized distal cervical segments of the internal carotid arteries are patent with antegrade flow. Petrous, cavernous, and supraclinoid segments widely patent without stenosis or other abnormality. A1 segments widely patent. Normal anterior communicating artery complex. Anterior cerebral arteries patent to their distal aspects without stenosis. No M1 stenosis or  occlusion. Normal MCA bifurcations. Distal MCA branches well perfused and symmetric. POSTERIOR CIRCULATION: Both V4 segments patent to the vertebrobasilar junction without stenosis. Right vertebral artery slightly dominant. Both PICA origins patent and normal. Basilar widely patent to its distal aspect without stenosis. Superior cerebellar arteries patent  bilaterally. PCAs supplied via the basilar as well as small bilateral posterior communicating arteries. Both PCAs well perfused to their distal aspects without stenosis. No intracranial aneurysm. MRA NECK FINDINGS AORTIC ARCH: Examination limited by lack of IV contrast and motion artifact. Visualized aortic arch normal caliber with normal 3 vessel morphology. No hemodynamically significant stenosis seen about the origin of the great vessels. RIGHT CAROTID SYSTEM: Visualized right CCA patent without stenosis. No significant atheromatous narrowing about the right bifurcation. Right ICA patent distally without stenosis, evidence for dissection or occlusion. LEFT CAROTID SYSTEM: Visualized left CCA patent from its origin to the bifurcation without stenosis. No significant atheromatous narrowing about the left bifurcation. Left ICA patent distally without stenosis, evidence for dissection or occlusion. VERTEBRAL ARTERIES: Both vertebral arteries appear to arise from the subclavian arteries. Origins not well assessed on this somewhat limited exam. Right vertebral artery dominant. Visualized portions of the vertebral arteries patent without stenosis, evidence for dissection or occlusion. IMPRESSION: MRI HEAD: 1. 1 cm acute perforator type infarct involving the posterior right basal ganglia. No associated hemorrhage or mass effect. 2. Otherwise normal brain MRI. MRA HEAD: Normal intracranial MRA. No large vessel occlusion, hemodynamically significant stenosis, or other acute vascular abnormality. MRA NECK: Normal MRA of the neck. No hemodynamically significant or critical flow limiting stenosis. Electronically Signed   By: Jeannine Boga M.D.   On: 11/25/2020 23:07   MR ANGIO NECK WO CONTRAST  Result Date: 11/25/2020 CLINICAL DATA:  Initial evaluation for acute stroke. Slurred speech, right-sided weakness. EXAM: MRI HEAD WITHOUT CONTRAST MRA HEAD WITHOUT CONTRAST MRA NECK WITHOUT CONTRAST TECHNIQUE: Multiplanar,  multiecho pulse sequences of the brain and surrounding structures were obtained without intravenous contrast. Angiographic images of the Circle of Willis were obtained using MRA technique without intravenous contrast. Angiographic images of the neck were obtained using MRA technique without intravenous contrast. Carotid stenosis measurements (when applicable) are obtained utilizing NASCET criteria, using the distal internal carotid diameter as the denominator. COMPARISON:  Previous head CT from earlier the same day. FINDINGS: MRI HEAD FINDINGS Brain: Cerebral volume within normal limits. No significant cerebral white matter disease. 1 cm focus of curvilinear restricted diffusion seen involving the posterior right basal ganglia, consistent with an acute perforator type infarct (series 5, image 82). Associated T2/FLAIR signal abnormality. No associated hemorrhage or mass effect. No other evidence for acute or subacute ischemia. Gray-white matter differentiation otherwise maintained. No areas of remote cortical infarction. No evidence for acute or chronic intracranial hemorrhage. No mass lesion, midline shift or mass effect. No hydrocephalus or extra-axial fluid collection. Pituitary gland suprasellar region within normal limits. Midline structures intact. Vascular: Major intracranial vascular flow voids are maintained. Skull and upper cervical spine: Craniocervical junction normal. Bone marrow signal intensity within normal limits. No scalp soft tissue abnormality. Sinuses/Orbits: Globes and orbital soft tissues within normal limits. Retention cyst noted within the maxillary sinuses bilaterally. Paranasal sinuses are otherwise clear. No mastoid effusion. Inner ear structures grossly normal. Other: None. MRA HEAD FINDINGS ANTERIOR CIRCULATION: Visualized distal cervical segments of the internal carotid arteries are patent with antegrade flow. Petrous, cavernous, and supraclinoid segments widely patent without stenosis  or other abnormality. A1 segments widely patent. Normal anterior communicating artery complex. Anterior  cerebral arteries patent to their distal aspects without stenosis. No M1 stenosis or occlusion. Normal MCA bifurcations. Distal MCA branches well perfused and symmetric. POSTERIOR CIRCULATION: Both V4 segments patent to the vertebrobasilar junction without stenosis. Right vertebral artery slightly dominant. Both PICA origins patent and normal. Basilar widely patent to its distal aspect without stenosis. Superior cerebellar arteries patent bilaterally. PCAs supplied via the basilar as well as small bilateral posterior communicating arteries. Both PCAs well perfused to their distal aspects without stenosis. No intracranial aneurysm. MRA NECK FINDINGS AORTIC ARCH: Examination limited by lack of IV contrast and motion artifact. Visualized aortic arch normal caliber with normal 3 vessel morphology. No hemodynamically significant stenosis seen about the origin of the great vessels. RIGHT CAROTID SYSTEM: Visualized right CCA patent without stenosis. No significant atheromatous narrowing about the right bifurcation. Right ICA patent distally without stenosis, evidence for dissection or occlusion. LEFT CAROTID SYSTEM: Visualized left CCA patent from its origin to the bifurcation without stenosis. No significant atheromatous narrowing about the left bifurcation. Left ICA patent distally without stenosis, evidence for dissection or occlusion. VERTEBRAL ARTERIES: Both vertebral arteries appear to arise from the subclavian arteries. Origins not well assessed on this somewhat limited exam. Right vertebral artery dominant. Visualized portions of the vertebral arteries patent without stenosis, evidence for dissection or occlusion. IMPRESSION: MRI HEAD: 1. 1 cm acute perforator type infarct involving the posterior right basal ganglia. No associated hemorrhage or mass effect. 2. Otherwise normal brain MRI. MRA HEAD: Normal  intracranial MRA. No large vessel occlusion, hemodynamically significant stenosis, or other acute vascular abnormality. MRA NECK: Normal MRA of the neck. No hemodynamically significant or critical flow limiting stenosis. Electronically Signed   By: Jeannine Boga M.D.   On: 11/25/2020 23:07   MR BRAIN WO CONTRAST  Result Date: 11/25/2020 CLINICAL DATA:  Initial evaluation for acute stroke. Slurred speech, right-sided weakness. EXAM: MRI HEAD WITHOUT CONTRAST MRA HEAD WITHOUT CONTRAST MRA NECK WITHOUT CONTRAST TECHNIQUE: Multiplanar, multiecho pulse sequences of the brain and surrounding structures were obtained without intravenous contrast. Angiographic images of the Circle of Willis were obtained using MRA technique without intravenous contrast. Angiographic images of the neck were obtained using MRA technique without intravenous contrast. Carotid stenosis measurements (when applicable) are obtained utilizing NASCET criteria, using the distal internal carotid diameter as the denominator. COMPARISON:  Previous head CT from earlier the same day. FINDINGS: MRI HEAD FINDINGS Brain: Cerebral volume within normal limits. No significant cerebral white matter disease. 1 cm focus of curvilinear restricted diffusion seen involving the posterior right basal ganglia, consistent with an acute perforator type infarct (series 5, image 82). Associated T2/FLAIR signal abnormality. No associated hemorrhage or mass effect. No other evidence for acute or subacute ischemia. Gray-white matter differentiation otherwise maintained. No areas of remote cortical infarction. No evidence for acute or chronic intracranial hemorrhage. No mass lesion, midline shift or mass effect. No hydrocephalus or extra-axial fluid collection. Pituitary gland suprasellar region within normal limits. Midline structures intact. Vascular: Major intracranial vascular flow voids are maintained. Skull and upper cervical spine: Craniocervical junction  normal. Bone marrow signal intensity within normal limits. No scalp soft tissue abnormality. Sinuses/Orbits: Globes and orbital soft tissues within normal limits. Retention cyst noted within the maxillary sinuses bilaterally. Paranasal sinuses are otherwise clear. No mastoid effusion. Inner ear structures grossly normal. Other: None. MRA HEAD FINDINGS ANTERIOR CIRCULATION: Visualized distal cervical segments of the internal carotid arteries are patent with antegrade flow. Petrous, cavernous, and supraclinoid segments widely patent without stenosis or other  abnormality. A1 segments widely patent. Normal anterior communicating artery complex. Anterior cerebral arteries patent to their distal aspects without stenosis. No M1 stenosis or occlusion. Normal MCA bifurcations. Distal MCA branches well perfused and symmetric. POSTERIOR CIRCULATION: Both V4 segments patent to the vertebrobasilar junction without stenosis. Right vertebral artery slightly dominant. Both PICA origins patent and normal. Basilar widely patent to its distal aspect without stenosis. Superior cerebellar arteries patent bilaterally. PCAs supplied via the basilar as well as small bilateral posterior communicating arteries. Both PCAs well perfused to their distal aspects without stenosis. No intracranial aneurysm. MRA NECK FINDINGS AORTIC ARCH: Examination limited by lack of IV contrast and motion artifact. Visualized aortic arch normal caliber with normal 3 vessel morphology. No hemodynamically significant stenosis seen about the origin of the great vessels. RIGHT CAROTID SYSTEM: Visualized right CCA patent without stenosis. No significant atheromatous narrowing about the right bifurcation. Right ICA patent distally without stenosis, evidence for dissection or occlusion. LEFT CAROTID SYSTEM: Visualized left CCA patent from its origin to the bifurcation without stenosis. No significant atheromatous narrowing about the left bifurcation. Left ICA patent  distally without stenosis, evidence for dissection or occlusion. VERTEBRAL ARTERIES: Both vertebral arteries appear to arise from the subclavian arteries. Origins not well assessed on this somewhat limited exam. Right vertebral artery dominant. Visualized portions of the vertebral arteries patent without stenosis, evidence for dissection or occlusion. IMPRESSION: MRI HEAD: 1. 1 cm acute perforator type infarct involving the posterior right basal ganglia. No associated hemorrhage or mass effect. 2. Otherwise normal brain MRI. MRA HEAD: Normal intracranial MRA. No large vessel occlusion, hemodynamically significant stenosis, or other acute vascular abnormality. MRA NECK: Normal MRA of the neck. No hemodynamically significant or critical flow limiting stenosis. Electronically Signed   By: Jeannine Boga M.D.   On: 11/25/2020 23:07   CT HEAD CODE STROKE WO CONTRAST  Result Date: 11/25/2020 CLINICAL DATA:  Code stroke. Initial evaluation for acute right-sided weakness, right facial droop. EXAM: CT HEAD WITHOUT CONTRAST TECHNIQUE: Contiguous axial images were obtained from the base of the skull through the vertex without intravenous contrast. COMPARISON:  None. FINDINGS: Brain: Cerebral volume within normal limits for age. No acute intracranial hemorrhage. No acute large vessel territory infarct. No mass lesion, midline shift or mass effect. No hydrocephalus or extra-axial fluid collection. Vascular: No appreciable hyperdense vessel. Skull: Scalp soft tissues and calvarium within normal limits. Sinuses/Orbits: Globes orbital soft tissues are normal. Bilateral maxillary sinus retention cyst noted. Paranasal sinuses are otherwise clear. No mastoid effusion. Other: None. ASPECTS [facility] Stroke Program Early CT Score) - Ganglionic level infarction (caudate, lentiform nuclei, internal capsule, insula, M1-M3 cortex): 7 - Supraganglionic infarction (M4-M6 cortex): 3 Total score (0-10 with 10 being normal): 10  IMPRESSION: 1. Negative head CT.  No acute intracranial abnormality. 2. ASPECTS is 10. These results were communicated to Dr. Leonel Ramsay at 8:46 pmon 5/13/2022by text page via the [facility] messaging system. Electronically Signed   By: Jeannine Boga M.D.   On: 11/25/2020 20:47    PHYSICAL EXAM   Physical Exam  Constitutional: Appears well-developed and well-nourished.  Psych: Affect appropriate to situation Eyes: No scleral injection HENT: No OP obstruction MSK: no joint deformities.  Cardiovascular: Normal rate and regular rhythm.  Respiratory: Effort normal, non-labored breathing GI: Soft.  No distension. There is no tenderness.  Skin: WDI  Neuro: Mental Status: Patient is awake, alert, oriented to person, place, month, year, and situation. Patient is able to give a clear and coherent history. No signs of  aphasia or neglect Cranial Nerves: II: Visual Fields are full. Pupils are equal, round, and reactive to light.   III,IV, VI: EOMI without ptosis or diploplia.  V: Facial sensation is symmetric to temperature VII: Facial movement with right facial weakness VIII: hearing is intact to voice X: Uvula elevates symmetrically XI: Shoulder shrug is symmetric. XII: tongue is midline without atrophy or fasciculations.  Motor: Tone is normal. Bulk is normal. 5/5 strength was present on the left, though he does not drift on the right he does have minimal weakness to confrontation in both the right arm and leg. Sensory: Sensation is symmetric to light touch and temperature in the arms and legs. Cerebellar: FNFintact bilaterally   ASSESSMENT/PLAN [name] is a 50 y.o. male with history of  Diabetes, and  hypertension who presents with right-sided weakness that started on awakening from his nap.  He states that he laid down around 11 AM on day of admission nd then subsequently when he went to get out of bed he found himself unable to move his right side.  This was  around 6:45 PM.  Since then, his symptoms have significantly improved but he still has some right-sided weakness.  He denies numbness.  He was brought in by EMS, and given that he reported onset at 6:45 PM, code stroke was activated. initial NIHSS 2. CT head showed no acute intracranial abnormality, aspects 10.   Of note, he was recently diagnosed with Bell's palsy and has persistent right facial weakness due to this.  He also had transient unilateral weakness about a month ago as well.   MRI brain done showed small acute infarcts at right basal ganglia without hemorrhage or mass effect. MRA head and neck were unremarkable.   Admit for stroke workup. We will follow along.    Stroke: right basal ganglia.    Code Stroke  CT head No acute abnormality.    ASPECTS 10.       MRI 11/25/20   1. 1 cm acute perforator type infarct involving the posterior right  basal ganglia. No associated hemorrhage or mass effect.  2. Otherwise normal brain MRI.  MRA  11/25/20  Normal intracranial MRA. No large vessel occlusion, hemodynamically  significant stenosis, or other acute vascular abnormality.  MRA NECK:  Normal MRA of the neck. No hemodynamically significant or critical flow limiting stenosis.   2D Echo  Unremarkable. LVEF 55-60%. Left atrium normal. No IA shunt.  LDL 51  HgbA1c 5.8  VTE prophylaxis -  lovenox 40mg  daily    Diet   Diet NPO time specified     No antithrombotic prior to admission, now on aspirin 81 mg daily and clopidogrel 75 mg daily.    Therapy recommendations:  pending  Disposition:  pending  Hypertension  Home meds:  amlodipine10mg  daily, hydralzaiine 100mg  tid, hctz 25mg  daily, lisinopri 40mg  qhs.    Stable . Permissive hypertension (OK if < 220/120) but gradually normalize in 5-7 days . Long-term BP goal normotensive  Hyperlipidemia  Home meds:  non, atorvastatin 20mg  started in hospital  LDL 51, goal < 70    High intensity statin   Atorvastatin 20mg  started   Continue statin at discharge  Diabetes type II   Controlled  Home meds:  insulin  HgbA1c 5.8, goal < 7.0  CBGs Recent Labs    11/26/20 0428 11/26/20 0816 11/26/20 1148  GLUCAP 84 78 116*      SSI  Other Stroke Risk Factors  Obesity, Body mass index is 40.22 kg/m., BMI >/= 30 associated with increased stroke risk, recommend weight loss, diet and exercise as appropriate      Hospital day # 0   ATTENDING NOTE: I reviewed above note and agree with the assessment and plan. Pt was seen and examined.   50 yo M with PMH of DM, HTN, recent right bell's palsy admitted for right sided weakness. Improved in ED and resolved on my exam. He stated that he had one similar episode one month ago and resolved quickly. 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 CT no acute change. MRI showed right small/puncate infarct. MRA head and neck neg. CUS neg. EF 60-65%. A1C 5.8 and LDL 51.   On exam, he is neurologically intact except right facial droop, seems able to close both eyes equally. Right UE and LE strength equal to the left.   Pt right sided weakness not fit to right small CR infarct. It is likely the right small CR infarct is separate from pt current symptoms which is likely left brain TIA. Both events likely small vessel disease given location and lack of LVO sign. Now he is on ASA 81 and plavix 75 DAPT for 3 weeks and then ASA alone. Continue lipitor 20. Given bilateral involvement, recommend 30 day cardiac event monitoring as outpt to rule out afib. PT no follow up needed.   For detailed assessment and plan, please refer to above as I have made changes wherever appropriate.   Neurology will sign off. Please call with questions. Pt will follow up with stroke clinic NP at [facility] in about 4 weeks. Thanks for the consult.   Rosalin Hawking, MD PhD Stroke  Neurology 11/26/2020 10:48 PM       To contact Stroke Continuity provider, please refer to [email or web address] After hours, contact General Neurology

## 2020-11-26 NOTE — Discharge Summary (Signed)
Physician Discharge Summary  [name] [id number] DOB: [birth year] DOA: 11/25/2020  PCP: Tracie Harrier, MD  Admit date: 11/25/2020 Discharge date: 11/26/2020 30 Day Unplanned Readmission Risk Score   Flowsheet Row ED from 11/25/2020 in [place]  30 Day Unplanned Readmission Risk Score (%) 8.19 Filed at 11/26/2020 1200     This score is the patient's risk of an unplanned readmission within 30 days of being discharged (0 -100%). The score is based on dignosis, age, lab data, medications, orders, and past utilization.   Low:  0-14.9   Medium: 15-21.9   High: 22-29.9   Extreme: 30 and above         Admitted From: Home Disposition: Home  Recommendations for Outpatient Follow-up:  1. Follow up with PCP in 1-2 weeks 2. Follow-up with neurology in 4 weeks 3. Please obtain BMP/CBC in one week 4. Please follow up with your PCP on the following pending results: Unresulted Labs (From admission, onward)         None        Home Health: None Equipment/Devices: None  Discharge Condition: Stable CODE STATUS: Full code Diet recommendation: Cardiac  Subjective: Seen and examined this morning.  He stated that he was feeling well and all the symptoms that he came for have disappeared.  He wanted to be discharged early in the morning as he has to work.  After request, he was willing to stay all day to get the work-up done but was not willing to stay overnight.  HPI: [name] is a 50 y.o. male with medical history significant of hypertension, insulin-dependent type 2 diabetes, DVT, chronic venous insufficiency, CKD stage II, Bell's palsy presented to the ED via EMS as code stroke for evaluation of right-sided weakness, last known well at 11 AM.  Seen by neurology and out of tPA window.  Head CT negative for acute finding.  Brain MRI showing an acute posterior right basal ganglia infarct.  No associated hemorrhage or mass-effect.  MRA  head and neck normal. Labs in the ED showing WBC 5.1, hemoglobin 15.2, platelet count 319K.  Sodium 137, potassium 4.2, chloride 104, bicarb 24, BUN 16, creatinine 1.2 (at baseline), glucose 93.  INR 1.0.  Blood ethanol level undetectable.  UA and UDS pending.  Screening COVID and influenza PCR negative.  Patient states he works third shift and went to bed around 11 AM this morning.  He then woke up at 6:45 PM and noticed that his right arm and leg were weak.  He was also having slurred speech at that time.  He checked his blood sugar and it was 55 and he was drenched in sweat.  He had cereal around 7 AM in the morning and took his insulin at that time.  Reports a similar episode of right-sided weakness a month ago but he did not seek medical attention at that time.  Also reports right-sided facial droop for the past 2 months and states he was diagnosed with Bell's palsy.  Denies history of prior strokes.  No other complaints.  Denies fevers, cough, shortness of breath, chest pain, nausea, vomiting, abdominal pain, or diarrhea.  Brief/Interim Summary: Patient was admitted to hospital service with a stroke symptoms.  He was diagnosed with 1 cm acute infarct involving posterior right basal ganglia.  Patient was seen by neurologist.  Patient's symptoms had disappeared few hours after presentation to the ED.  He had very minimal right upper and lower extremity weakness  compared to the left side.  He was evaluated by PT OT and they did not recommend any further PT OT.  MRA of the head and neck without contrast was negative for any acute pathology.  Transthoracic echo ruled out any shunt and his ejection fraction was normal as well.  He passed swallow evaluation.  Neurology started him on aspirin and Plavix with instructions to continue both for 3 weeks and then continue aspirin alone after that.  Patient was very eager to be discharged and after a lot of request, he was willing to stay to complete the work-up.   Patient's carotid Doppler is still pending.  I spoke to the vascular lab technician personally and they will try to get this done before 5 PM.  Patient is willing to stay until the study is done but he is not willing to stay until the results are done.  Neurology has cleared him for discharge as well.  However they have recommended 30-day monitor.  I spoke to Fabian Sharp of cardiology and provided patient's information to them so they can arrange 30-day cardiac monitor for him.  He is also being discharged on atorvastatin.  Ideally, patients are advised to rest for few days however patient wants to go so he can work and pay his bills.  He understands our recommendations.  Please note that he has right facial droop due to his recent diagnosis of Bell's palsy.  PS: Per neurology, patient's right-sided weakness does not align with right-sided stroke as well.  Neurology believes that patient's current symptoms were likely TIA however he likely had recent or subacute right-sided stroke as well with no residual deficit on the left side of the body.  Discharge Diagnoses:  Principal Problem:   Acute CVA (cerebrovascular accident) [facility]) Active Problems:   Essential hypertension   Type 2 diabetes mellitus with stage 2 chronic kidney disease, with long-term current use of insulin (HCC)   CKD (chronic kidney disease)    Discharge Instructions   Allergies as of 11/26/2020   No Known Allergies     Medication List    TAKE these medications   Accu-Chek FastClix Lancets Misc U UTD TO CHECK BLOOD SUGAR TID   amLODipine 10 MG tablet Commonly known as: NORVASC Take 10 mg by mouth every morning.   aspirin 81 MG EC tablet Take 1 tablet (81 mg total) by mouth daily. Swallow whole. Start taking on: Nov 27, 2020   atorvastatin 10 MG tablet Commonly known as: LIPITOR Take 2 tablets (20 mg total) by mouth daily. Start taking on: Nov 27, 2020   clopidogrel 75 MG tablet Commonly known as: PLAVIX Take 1  tablet (75 mg total) by mouth daily for 20 days. Start taking on: Nov 28, 2020   glucose blood test strip Use 4 (four) times daily Use as instructed.   Accu-Chek Guide test strip Generic drug: glucose blood USE TO TEST QID UTD   hydrALAZINE 100 MG tablet Commonly known as: APRESOLINE Take 100 mg by mouth 3 (three) times daily.   hydrochlorothiazide 25 MG tablet Commonly known as: HYDRODIURIL Take 25 mg by mouth every morning.   HYDROcodone-acetaminophen 5-325 MG tablet Commonly known as: Norco Take 1 tablet by mouth every 6 (six) hours as needed for up to 6 doses for moderate pain.   ibuprofen 800 MG tablet Commonly known as: ADVIL Take 1 tablet (800 mg total) by mouth every 8 (eight) hours as needed for mild pain or moderate pain.   insulin aspart  100 UNIT/ML FlexPen Commonly known as: NOVOLOG Inject 10-15 Units into the skin 4 (four) times daily. Sliding scale   insulin glargine 100 UNIT/ML injection Commonly known as: LANTUS Inject 30 Units into the skin in the morning.   lisinopril 40 MG tablet Commonly known as: ZESTRIL Take 40 mg by mouth every morning.   metoprolol tartrate 100 MG tablet Commonly known as: LOPRESSOR Take 1 tablet (100 mg total) by mouth 2 (two) times daily.       Follow-up Information    Barbette ReichmannHande, Vishwanath, MD Follow up in 1 week(s).   Specialty: Internal Medicine Contact information: [address] [place] [place] [address] [phone]        Micki RileySethi, Pramod S, MD Follow up in 4 week(s).   Specialties: Neurology, Radiology Contact information: [address] [place] [address] [phone]              No Known Allergies  Consultations: Neurology   Procedures/Studies: MR ANGIO HEAD WO CONTRAST  Result Date: 11/25/2020 CLINICAL DATA:  Initial evaluation for acute stroke. Slurred speech, right-sided weakness. EXAM: MRI HEAD WITHOUT CONTRAST MRA HEAD WITHOUT CONTRAST MRA NECK WITHOUT  CONTRAST TECHNIQUE: Multiplanar, multiecho pulse sequences of the brain and surrounding structures were obtained without intravenous contrast. Angiographic images of the Circle of Willis were obtained using MRA technique without intravenous contrast. Angiographic images of the neck were obtained using MRA technique without intravenous contrast. Carotid stenosis measurements (when applicable) are obtained utilizing NASCET criteria, using the distal internal carotid diameter as the denominator. COMPARISON:  Previous head CT from earlier the same day. FINDINGS: MRI HEAD FINDINGS Brain: Cerebral volume within normal limits. No significant cerebral white matter disease. 1 cm focus of curvilinear restricted diffusion seen involving the posterior right basal ganglia, consistent with an acute perforator type infarct (series 5, image 82). Associated T2/FLAIR signal abnormality. No associated hemorrhage or mass effect. No other evidence for acute or subacute ischemia. Gray-white matter differentiation otherwise maintained. No areas of remote cortical infarction. No evidence for acute or chronic intracranial hemorrhage. No mass lesion, midline shift or mass effect. No hydrocephalus or extra-axial fluid collection. Pituitary gland suprasellar region within normal limits. Midline structures intact. Vascular: Major intracranial vascular flow voids are maintained. Skull and upper cervical spine: Craniocervical junction normal. Bone marrow signal intensity within normal limits. No scalp soft tissue abnormality. Sinuses/Orbits: Globes and orbital soft tissues within normal limits. Retention cyst noted within the maxillary sinuses bilaterally. Paranasal sinuses are otherwise clear. No mastoid effusion. Inner ear structures grossly normal. Other: None. MRA HEAD FINDINGS ANTERIOR CIRCULATION: Visualized distal cervical segments of the internal carotid arteries are patent with antegrade flow. Petrous, cavernous, and supraclinoid  segments widely patent without stenosis or other abnormality. A1 segments widely patent. Normal anterior communicating artery complex. Anterior cerebral arteries patent to their distal aspects without stenosis. No M1 stenosis or occlusion. Normal MCA bifurcations. Distal MCA branches well perfused and symmetric. POSTERIOR CIRCULATION: Both V4 segments patent to the vertebrobasilar junction without stenosis. Right vertebral artery slightly dominant. Both PICA origins patent and normal. Basilar widely patent to its distal aspect without stenosis. Superior cerebellar arteries patent bilaterally. PCAs supplied via the basilar as well as small bilateral posterior communicating arteries. Both PCAs well perfused to their distal aspects without stenosis. No intracranial aneurysm. MRA NECK FINDINGS AORTIC ARCH: Examination limited by lack of IV contrast and motion artifact. Visualized aortic arch normal caliber with normal 3 vessel morphology. No hemodynamically significant stenosis seen  about the origin of the great vessels. RIGHT CAROTID SYSTEM: Visualized right CCA patent without stenosis. No significant atheromatous narrowing about the right bifurcation. Right ICA patent distally without stenosis, evidence for dissection or occlusion. LEFT CAROTID SYSTEM: Visualized left CCA patent from its origin to the bifurcation without stenosis. No significant atheromatous narrowing about the left bifurcation. Left ICA patent distally without stenosis, evidence for dissection or occlusion. VERTEBRAL ARTERIES: Both vertebral arteries appear to arise from the subclavian arteries. Origins not well assessed on this somewhat limited exam. Right vertebral artery dominant. Visualized portions of the vertebral arteries patent without stenosis, evidence for dissection or occlusion. IMPRESSION: MRI HEAD: 1. 1 cm acute perforator type infarct involving the posterior right basal ganglia. No associated hemorrhage or mass effect. 2. Otherwise  normal brain MRI. MRA HEAD: Normal intracranial MRA. No large vessel occlusion, hemodynamically significant stenosis, or other acute vascular abnormality. MRA NECK: Normal MRA of the neck. No hemodynamically significant or critical flow limiting stenosis. Electronically Signed   By: Jeannine Boga M.D.   On: 11/25/2020 23:07   MR ANGIO NECK WO CONTRAST  Result Date: 11/25/2020 CLINICAL DATA:  Initial evaluation for acute stroke. Slurred speech, right-sided weakness. EXAM: MRI HEAD WITHOUT CONTRAST MRA HEAD WITHOUT CONTRAST MRA NECK WITHOUT CONTRAST TECHNIQUE: Multiplanar, multiecho pulse sequences of the brain and surrounding structures were obtained without intravenous contrast. Angiographic images of the Circle of Willis were obtained using MRA technique without intravenous contrast. Angiographic images of the neck were obtained using MRA technique without intravenous contrast. Carotid stenosis measurements (when applicable) are obtained utilizing NASCET criteria, using the distal internal carotid diameter as the denominator. COMPARISON:  Previous head CT from earlier the same day. FINDINGS: MRI HEAD FINDINGS Brain: Cerebral volume within normal limits. No significant cerebral white matter disease. 1 cm focus of curvilinear restricted diffusion seen involving the posterior right basal ganglia, consistent with an acute perforator type infarct (series 5, image 82). Associated T2/FLAIR signal abnormality. No associated hemorrhage or mass effect. No other evidence for acute or subacute ischemia. Gray-white matter differentiation otherwise maintained. No areas of remote cortical infarction. No evidence for acute or chronic intracranial hemorrhage. No mass lesion, midline shift or mass effect. No hydrocephalus or extra-axial fluid collection. Pituitary gland suprasellar region within normal limits. Midline structures intact. Vascular: Major intracranial vascular flow voids are maintained. Skull and upper  cervical spine: Craniocervical junction normal. Bone marrow signal intensity within normal limits. No scalp soft tissue abnormality. Sinuses/Orbits: Globes and orbital soft tissues within normal limits. Retention cyst noted within the maxillary sinuses bilaterally. Paranasal sinuses are otherwise clear. No mastoid effusion. Inner ear structures grossly normal. Other: None. MRA HEAD FINDINGS ANTERIOR CIRCULATION: Visualized distal cervical segments of the internal carotid arteries are patent with antegrade flow. Petrous, cavernous, and supraclinoid segments widely patent without stenosis or other abnormality. A1 segments widely patent. Normal anterior communicating artery complex. Anterior cerebral arteries patent to their distal aspects without stenosis. No M1 stenosis or occlusion. Normal MCA bifurcations. Distal MCA branches well perfused and symmetric. POSTERIOR CIRCULATION: Both V4 segments patent to the vertebrobasilar junction without stenosis. Right vertebral artery slightly dominant. Both PICA origins patent and normal. Basilar widely patent to its distal aspect without stenosis. Superior cerebellar arteries patent bilaterally. PCAs supplied via the basilar as well as small bilateral posterior communicating arteries. Both PCAs well perfused to their distal aspects without stenosis. No intracranial aneurysm. MRA NECK FINDINGS AORTIC ARCH: Examination limited by lack of IV contrast and motion artifact. Visualized aortic arch  normal caliber with normal 3 vessel morphology. No hemodynamically significant stenosis seen about the origin of the great vessels. RIGHT CAROTID SYSTEM: Visualized right CCA patent without stenosis. No significant atheromatous narrowing about the right bifurcation. Right ICA patent distally without stenosis, evidence for dissection or occlusion. LEFT CAROTID SYSTEM: Visualized left CCA patent from its origin to the bifurcation without stenosis. No significant atheromatous narrowing about  the left bifurcation. Left ICA patent distally without stenosis, evidence for dissection or occlusion. VERTEBRAL ARTERIES: Both vertebral arteries appear to arise from the subclavian arteries. Origins not well assessed on this somewhat limited exam. Right vertebral artery dominant. Visualized portions of the vertebral arteries patent without stenosis, evidence for dissection or occlusion. IMPRESSION: MRI HEAD: 1. 1 cm acute perforator type infarct involving the posterior right basal ganglia. No associated hemorrhage or mass effect. 2. Otherwise normal brain MRI. MRA HEAD: Normal intracranial MRA. No large vessel occlusion, hemodynamically significant stenosis, or other acute vascular abnormality. MRA NECK: Normal MRA of the neck. No hemodynamically significant or critical flow limiting stenosis. Electronically Signed   By: Jeannine Boga M.D.   On: 11/25/2020 23:07   MR BRAIN WO CONTRAST  Result Date: 11/25/2020 CLINICAL DATA:  Initial evaluation for acute stroke. Slurred speech, right-sided weakness. EXAM: MRI HEAD WITHOUT CONTRAST MRA HEAD WITHOUT CONTRAST MRA NECK WITHOUT CONTRAST TECHNIQUE: Multiplanar, multiecho pulse sequences of the brain and surrounding structures were obtained without intravenous contrast. Angiographic images of the Circle of Willis were obtained using MRA technique without intravenous contrast. Angiographic images of the neck were obtained using MRA technique without intravenous contrast. Carotid stenosis measurements (when applicable) are obtained utilizing NASCET criteria, using the distal internal carotid diameter as the denominator. COMPARISON:  Previous head CT from earlier the same day. FINDINGS: MRI HEAD FINDINGS Brain: Cerebral volume within normal limits. No significant cerebral white matter disease. 1 cm focus of curvilinear restricted diffusion seen involving the posterior right basal ganglia, consistent with an acute perforator type infarct (series 5, image 82).  Associated T2/FLAIR signal abnormality. No associated hemorrhage or mass effect. No other evidence for acute or subacute ischemia. Gray-white matter differentiation otherwise maintained. No areas of remote cortical infarction. No evidence for acute or chronic intracranial hemorrhage. No mass lesion, midline shift or mass effect. No hydrocephalus or extra-axial fluid collection. Pituitary gland suprasellar region within normal limits. Midline structures intact. Vascular: Major intracranial vascular flow voids are maintained. Skull and upper cervical spine: Craniocervical junction normal. Bone marrow signal intensity within normal limits. No scalp soft tissue abnormality. Sinuses/Orbits: Globes and orbital soft tissues within normal limits. Retention cyst noted within the maxillary sinuses bilaterally. Paranasal sinuses are otherwise clear. No mastoid effusion. Inner ear structures grossly normal. Other: None. MRA HEAD FINDINGS ANTERIOR CIRCULATION: Visualized distal cervical segments of the internal carotid arteries are patent with antegrade flow. Petrous, cavernous, and supraclinoid segments widely patent without stenosis or other abnormality. A1 segments widely patent. Normal anterior communicating artery complex. Anterior cerebral arteries patent to their distal aspects without stenosis. No M1 stenosis or occlusion. Normal MCA bifurcations. Distal MCA branches well perfused and symmetric. POSTERIOR CIRCULATION: Both V4 segments patent to the vertebrobasilar junction without stenosis. Right vertebral artery slightly dominant. Both PICA origins patent and normal. Basilar widely patent to its distal aspect without stenosis. Superior cerebellar arteries patent bilaterally. PCAs supplied via the basilar as well as small bilateral posterior communicating arteries. Both PCAs well perfused to their distal aspects without stenosis. No intracranial aneurysm. MRA NECK FINDINGS AORTIC ARCH: Examination limited  by lack of IV  contrast and motion artifact. Visualized aortic arch normal caliber with normal 3 vessel morphology. No hemodynamically significant stenosis seen about the origin of the great vessels. RIGHT CAROTID SYSTEM: Visualized right CCA patent without stenosis. No significant atheromatous narrowing about the right bifurcation. Right ICA patent distally without stenosis, evidence for dissection or occlusion. LEFT CAROTID SYSTEM: Visualized left CCA patent from its origin to the bifurcation without stenosis. No significant atheromatous narrowing about the left bifurcation. Left ICA patent distally without stenosis, evidence for dissection or occlusion. VERTEBRAL ARTERIES: Both vertebral arteries appear to arise from the subclavian arteries. Origins not well assessed on this somewhat limited exam. Right vertebral artery dominant. Visualized portions of the vertebral arteries patent without stenosis, evidence for dissection or occlusion. IMPRESSION: MRI HEAD: 1. 1 cm acute perforator type infarct involving the posterior right basal ganglia. No associated hemorrhage or mass effect. 2. Otherwise normal brain MRI. MRA HEAD: Normal intracranial MRA. No large vessel occlusion, hemodynamically significant stenosis, or other acute vascular abnormality. MRA NECK: Normal MRA of the neck. No hemodynamically significant or critical flow limiting stenosis. Electronically Signed   By: Jeannine Boga M.D.   On: 11/25/2020 23:07   ECHOCARDIOGRAM COMPLETE  Result Date: 11/26/2020    ECHOCARDIOGRAM REPORT   Patient Name:   [name] Date of Exam: 11/26/2020 Medical Rec #:  [id number]          Height:       68.0 in Accession #:    [id number]         Weight:       264.6 lb Date of Birth:  09/26/1970          BSA:          2.302 m Patient Age:    50 years           BP:           146/86 mmHg Patient Gender: M                  HR:           77 bpm. Exam Location:  [facility] Procedure: 2D Echo, Cardiac Doppler and Color Doppler  Indications:    Stroke I63.9  History:        Patient has no prior history of Echocardiogram examinations.                 Stroke; Risk Factors:Hypertension and Dyslipidemia. Obesity.  Sonographer:    Alvino Chapel RCS Referring Phys: [id number] Parkin  1. Left ventricular ejection fraction, by estimation, is 60 to 65%. The left ventricle has normal function. The left ventricle has no regional wall motion abnormalities. There is mild concentric left ventricular hypertrophy. Left ventricular diastolic parameters are consistent with Grade I diastolic dysfunction (impaired relaxation).  2. Right ventricular systolic function is normal. The right ventricular size is normal. Tricuspid regurgitation signal is inadequate for assessing PA pressure.  3. The mitral valve is normal in structure. No evidence of mitral valve regurgitation. No evidence of mitral stenosis.  4. The aortic valve has an indeterminant number of cusps. Aortic valve regurgitation is mild. Mild to moderate aortic valve sclerosis/calcification is present, without any evidence of aortic stenosis.  5. The inferior vena cava is normal in size with greater than 50% respiratory variability, suggesting right atrial pressure of 3 mmHg. FINDINGS  Left Ventricle: Left ventricular ejection fraction, by estimation, is 60 to 65%. The left ventricle has  normal function. The left ventricle has no regional wall motion abnormalities. The left ventricular internal cavity size was normal in size. There is  mild concentric left ventricular hypertrophy. Left ventricular diastolic parameters are consistent with Grade I diastolic dysfunction (impaired relaxation). Normal left ventricular filling pressure. Right Ventricle: The right ventricular size is normal. No increase in right ventricular wall thickness. Right ventricular systolic function is normal. Tricuspid regurgitation signal is inadequate for assessing PA pressure. Left Atrium: Left atrial size was  normal in size. Right Atrium: Right atrial size was normal in size. Pericardium: There is no evidence of pericardial effusion. Mitral Valve: The mitral valve is normal in structure. Mild mitral annular calcification. No evidence of mitral valve regurgitation. No evidence of mitral valve stenosis. Tricuspid Valve: The tricuspid valve is normal in structure. Tricuspid valve regurgitation is not demonstrated. No evidence of tricuspid stenosis. Aortic Valve: The aortic valve has an indeterminant number of cusps. Aortic valve regurgitation is mild. Mild to moderate aortic valve sclerosis/calcification is present, without any evidence of aortic stenosis. Pulmonic Valve: The pulmonic valve was normal in structure. Pulmonic valve regurgitation is not visualized. No evidence of pulmonic stenosis. Aorta: The aortic root is normal in size and structure. Venous: The inferior vena cava is normal in size with greater than 50% respiratory variability, suggesting right atrial pressure of 3 mmHg. IAS/Shunts: No atrial level shunt detected by color flow Doppler.  LEFT VENTRICLE PLAX 2D LVIDd:         5.00 cm  Diastology LVIDs:         3.00 cm  LV e' medial:    5.55 cm/s LV PW:         1.10 cm  LV E/e' medial:  13.1 LV IVS:        1.10 cm  LV e' lateral:   7.51 cm/s LVOT diam:     2.00 cm  LV E/e' lateral: 9.7 LV SV:         79 LV SV Index:   35 LVOT Area:     3.14 cm  RIGHT VENTRICLE RV S prime:     16.80 cm/s TAPSE (M-mode): 2.1 cm LEFT ATRIUM             Index       RIGHT ATRIUM           Index LA diam:        2.70 cm 1.17 cm/m  RA Area:     14.80 cm LA Vol (A2C):   75.2 ml 32.67 ml/m RA Volume:   28.60 ml  12.43 ml/m LA Vol (A4C):   66.5 ml 28.89 ml/m LA Biplane Vol: 72.3 ml 31.41 ml/m  AORTIC VALVE LVOT Vmax:   127.00 cm/s LVOT Vmean:  77.000 cm/s LVOT VTI:    0.253 m  AORTA Ao Root diam: 3.00 cm MITRAL VALVE MV Area (PHT): 3.60 cm    SHUNTS MV Decel Time: 211 msec    Systemic VTI:  0.25 m MV E velocity: 72.80 cm/s   Systemic Diam: 2.00 cm MV A velocity: 96.40 cm/s MV E/A ratio:  0.76 Fransico Him MD Electronically signed by Fransico Him MD Signature Date/Time: 11/26/2020/1:12:56 PM    Final    CT HEAD CODE STROKE WO CONTRAST  Result Date: 11/25/2020 CLINICAL DATA:  Code stroke. Initial evaluation for acute right-sided weakness, right facial droop. EXAM: CT HEAD WITHOUT CONTRAST TECHNIQUE: Contiguous axial images were obtained from the base of the skull through the vertex without intravenous contrast.  COMPARISON:  None. FINDINGS: Brain: Cerebral volume within normal limits for age. No acute intracranial hemorrhage. No acute large vessel territory infarct. No mass lesion, midline shift or mass effect. No hydrocephalus or extra-axial fluid collection. Vascular: No appreciable hyperdense vessel. Skull: Scalp soft tissues and calvarium within normal limits. Sinuses/Orbits: Globes orbital soft tissues are normal. Bilateral maxillary sinus retention cyst noted. Paranasal sinuses are otherwise clear. No mastoid effusion. Other: None. ASPECTS [facility] Stroke Program Early CT Score) - Ganglionic level infarction (caudate, lentiform nuclei, internal capsule, insula, M1-M3 cortex): 7 - Supraganglionic infarction (M4-M6 cortex): 3 Total score (0-10 with 10 being normal): 10 IMPRESSION: 1. Negative head CT.  No acute intracranial abnormality. 2. ASPECTS is 10. These results were communicated to Dr. Leonel Ramsay at 8:46 pmon 5/13/2022by text page via the [facility] messaging system. Electronically Signed   By: Jeannine Boga M.D.   On: 11/25/2020 20:47      Discharge Exam: Vitals:   11/26/20 1315 11/26/20 1345  BP: 138/86 139/87  Pulse: 72 72  Resp: 16 15  Temp:    SpO2: 94% 93%   Vitals:   11/26/20 1215 11/26/20 1245 11/26/20 1315 11/26/20 1345  BP: (!) 146/86 133/71 138/86 139/87  Pulse: 75 71 72 72  Resp: 11 15 16 15   Temp:      TempSrc:      SpO2: 98% 95% 94% 93%  Weight:      Height:        General: Pt is  alert, awake, not in acute distress Cardiovascular: RRR, S1/S2 +, no rubs, no gallops Respiratory: CTA bilaterally, no wheezing, no rhonchi Abdominal: Soft, NT, ND, bowel sounds + Extremities: no edema, no cyanosis Neuro exam: Very minimal weakness in the right upper and lower extremity, right facial droop.    The results of significant diagnostics from this hospitalization (including imaging, microbiology, ancillary and laboratory) are listed below for reference.     Microbiology: Recent Results (from the past 240 hour(s))  Resp Panel by RT-PCR (Flu A&B, Covid) Nasopharyngeal Swab     Status: None   Collection Time: 11/25/20 10:53 PM   Specimen: Nasopharyngeal Swab; Nasopharyngeal(NP) swabs in vial transport medium  Result Value Ref Range Status   SARS Coronavirus 2 by RT PCR NEGATIVE NEGATIVE Final    Comment: (NOTE) SARS-CoV-2 target nucleic acids are NOT DETECTED.  The SARS-CoV-2 RNA is generally detectable in upper respiratory specimens during the acute phase of infection. The lowest concentration of SARS-CoV-2 viral copies this assay can detect is 138 copies/mL. A negative result does not preclude SARS-Cov-2 infection and should not be used as the sole basis for treatment or other patient management decisions. A negative result may occur with  improper specimen collection/handling, submission of specimen other than nasopharyngeal swab, presence of viral mutation(s) within the areas targeted by this assay, and inadequate number of viral copies(<138 copies/mL). A negative result must be combined with clinical observations, patient history, and epidemiological information. The expected result is Negative.  Fact Sheet for Patients:  EntrepreneurPulse.com.au  Fact Sheet for Healthcare Providers:  IncredibleEmployment.be  This test is no t yet approved or cleared by the [place] FDA and  has been authorized for detection and/or  diagnosis of SARS-CoV-2 by FDA under an Emergency Use Authorization (EUA). This EUA will remain  in effect (meaning this test can be used) for the duration of the COVID-19 declaration under Section 564(b)(1) of the Act, 21 U.S.C.section 360bbb-3(b)(1), unless the authorization is terminated  or revoked sooner.  Influenza A by PCR NEGATIVE NEGATIVE Final   Influenza B by PCR NEGATIVE NEGATIVE Final    Comment: (NOTE) The Xpert Xpress SARS-CoV-2/FLU/RSV plus assay is intended as an aid in the diagnosis of influenza from Nasopharyngeal swab specimens and should not be used as a sole basis for treatment. Nasal washings and aspirates are unacceptable for Xpert Xpress SARS-CoV-2/FLU/RSV testing.  Fact Sheet for Patients: EntrepreneurPulse.com.au  Fact Sheet for Healthcare Providers: IncredibleEmployment.be  This test is not yet approved or cleared by the [place] FDA and has been authorized for detection and/or diagnosis of SARS-CoV-2 by FDA under an Emergency Use Authorization (EUA). This EUA will remain in effect (meaning this test can be used) for the duration of the COVID-19 declaration under Section 564(b)(1) of the Act, 21 U.S.C. section 360bbb-3(b)(1), unless the authorization is terminated or revoked.  Performed at [facility], [place] [address], [place],  [address]      Labs: BNP (last 3 results) No results for input(s): BNP in the last 8760 hours. Basic Metabolic Panel: Recent Labs  Lab 11/25/20 2026 11/25/20 2039  NA 137 140  K 4.2 4.3  CL 104 105  CO2 24  --   GLUCOSE 93 92  BUN 16 18  CREATININE 1.29* 1.40*  CALCIUM 9.3  --    Liver Function Tests: Recent Labs  Lab 11/25/20 2026  AST 28  ALT 16  ALKPHOS 63  BILITOT 0.5  PROT 7.4  ALBUMIN 3.6   No results for input(s): LIPASE, AMYLASE in the last 168 hours. No results for input(s): AMMONIA in the last 168 hours. CBC: Recent Labs  Lab  11/25/20 2026 11/25/20 2039  WBC 5.1  --   NEUTROABS 3.8  --   HGB 15.2 16.3  HCT 47.6 48.0  MCV 93.0  --   PLT 319  --    Cardiac Enzymes: No results for input(s): CKTOTAL, CKMB, CKMBINDEX, TROPONINI in the last 168 hours. BNP: Invalid input(s): POCBNP CBG: Recent Labs  Lab 11/25/20 2026 11/26/20 0428 11/26/20 0816 11/26/20 1148  GLUCAP 78 84 78 116*   D-Dimer No results for input(s): DDIMER in the last 72 hours. Hgb A1c Recent Labs    11/26/20 0350  HGBA1C 5.8*   Lipid Profile Recent Labs    11/26/20 0350  CHOL 109  HDL 50  LDLCALC 51  TRIG 40  CHOLHDL 2.2   Thyroid function studies No results for input(s): TSH, T4TOTAL, T3FREE, THYROIDAB in the last 72 hours.  Invalid input(s): FREET3 Anemia work up No results for input(s): VITAMINB12, FOLATE, FERRITIN, TIBC, IRON, RETICCTPCT in the last 72 hours. Urinalysis    Component Value Date/Time   COLORURINE YELLOW 11/26/2020 1050   APPEARANCEUR CLEAR 11/26/2020 1050   LABSPEC 1.016 11/26/2020 1050   PHURINE 5.0 11/26/2020 1050   GLUCOSEU NEGATIVE 11/26/2020 [place] 11/26/2020 [place] 11/26/2020 1050   KETONESUR 5 (A) 11/26/2020 [place] 11/26/2020 1050   NITRITE NEGATIVE 11/26/2020 [place] 11/26/2020 1050   Sepsis Labs Invalid input(s): PROCALCITONIN,  WBC,  LACTICIDVEN Microbiology Recent Results (from the past 240 hour(s))  Resp Panel by RT-PCR (Flu A&B, Covid) Nasopharyngeal Swab     Status: None   Collection Time: 11/25/20 10:53 PM   Specimen: Nasopharyngeal Swab; Nasopharyngeal(NP) swabs in vial transport medium  Result Value Ref Range Status   SARS Coronavirus 2 by RT PCR NEGATIVE NEGATIVE Final    Comment: (NOTE) SARS-CoV-2  target nucleic acids are NOT DETECTED.  The SARS-CoV-2 RNA is generally detectable in upper respiratory specimens during the acute phase of infection. The lowest concentration of SARS-CoV-2 viral  copies this assay can detect is 138 copies/mL. A negative result does not preclude SARS-Cov-2 infection and should not be used as the sole basis for treatment or other patient management decisions. A negative result may occur with  improper specimen collection/handling, submission of specimen other than nasopharyngeal swab, presence of viral mutation(s) within the areas targeted by this assay, and inadequate number of viral copies(<138 copies/mL). A negative result must be combined with clinical observations, patient history, and epidemiological information. The expected result is Negative.  Fact Sheet for Patients:  EntrepreneurPulse.com.au  Fact Sheet for Healthcare Providers:  IncredibleEmployment.be  This test is no t yet approved or cleared by the [place] FDA and  has been authorized for detection and/or diagnosis of SARS-CoV-2 by FDA under an Emergency Use Authorization (EUA). This EUA will remain  in effect (meaning this test can be used) for the duration of the COVID-19 declaration under Section 564(b)(1) of the Act, 21 U.S.C.section 360bbb-3(b)(1), unless the authorization is terminated  or revoked sooner.       Influenza A by PCR NEGATIVE NEGATIVE Final   Influenza B by PCR NEGATIVE NEGATIVE Final    Comment: (NOTE) The Xpert Xpress SARS-CoV-2/FLU/RSV plus assay is intended as an aid in the diagnosis of influenza from Nasopharyngeal swab specimens and should not be used as a sole basis for treatment. Nasal washings and aspirates are unacceptable for Xpert Xpress SARS-CoV-2/FLU/RSV testing.  Fact Sheet for Patients: EntrepreneurPulse.com.au  Fact Sheet for Healthcare Providers: IncredibleEmployment.be  This test is not yet approved or cleared by the [place] FDA and has been authorized for detection and/or diagnosis of SARS-CoV-2 by FDA under an Emergency Use Authorization (EUA). This  EUA will remain in effect (meaning this test can be used) for the duration of the COVID-19 declaration under Section 564(b)(1) of the Act, 21 U.S.C. section 360bbb-3(b)(1), unless the authorization is terminated or revoked.  Performed at [facility], [place] [address], [place], [place] [address]      Time coordinating discharge: Over 30 minutes  SIGNED:   Darliss Cheney, MD  [facility] 11/26/2020, 3:26 PM  If 7PM-7AM, please contact night-coverage [email or web address]

## 2021-01-23 NOTE — Progress Notes (Deleted)
[facility] [address] [place].  [address] [phone]       HOSPITAL FOLLOW UP NOTE  [name] Date of Birth:  [birth year] Medical Record Number:  [id number]   Reason for Referral:  hospital stroke follow up    SUBJECTIVE:   CHIEF COMPLAINT:  No chief complaint on file.   HPI:   [name] is a 50 y.o. male with history of DM, HTN and right Bell's palsy with residual right facial weakness in 09/2020 who presented on 11/25/2020 with right-sided weakness that started on awakening from a nap where he layed down around 11am and upon awakening around 6:45pm, he was found he was unable to his right side. Personally reviewed hospitalization pertinent progress notes, lab work and imaging with summary provided. Per review of ED notes, symptoms significantly improved upon arrival and eventually resolved - also noted to have similar episode 1 month ago but resolved quickly.  Stroke work-up revealed right basal ganglia infarct although presenting symptoms do not correlate and likely presenting symptoms possibly in setting of left brain TIA.  Both events likely small vessel disease etiology given location and lack of LVO but given bilateral involvement, recommend completion of 30-day cardiac event monitor to rule out A. fib.  MRA head negative. CUS neg. EF 60 to 65%.  A1c 5.8.  LDL 51.  Recommended DAPT for 3 weeks then aspirin alone as well as continuation of atorvastatin 20 mg daily.        ROS:   14 system review of systems performed and negative with exception of ***  PMH:  Past Medical History:  Diagnosis Date   Acute blood loss as cause of postoperative anemia 04/18/2017   ARF (acute renal failure) (HCC)    Cellulitis of right lower leg 03/31/2017   Chronic anticoagulation 03/31/2017   Diabetes mellitus without complication ([place])    Diabetic ulcer of calf ([place]) 03/31/2017   DVT of proximal lower limb ([place]) 03/20/2017   R calf area    Escherichia coli sepsis ([place]) 04/18/2017   Gout    History of hiatal hernia    Hypertension    Hypoalbuminemia 03/31/2017   Necrotizing fasciitis (HCC)    Right leg DVT ([place]) 03/31/2017   Sepsis ([place]) 03/30/2017   Sinus tachycardia 03/31/2017   Type 2 diabetes mellitus with stage 2 chronic kidney disease, with long-term current use of insulin ([place]) 03/27/2017    PSH:  Past Surgical History:  Procedure Laterality Date   COLONOSCOPY WITH PROPOFOL N/A 02/10/2018   Procedure: COLONOSCOPY WITH PROPOFOL;  Surgeon: Lin Landsman, MD;  Location: [facility];  Service: Gastroenterology;  Laterality: N/A;   ELBOW SURGERY Right    DUE TO INFECTION   ESOPHAGOGASTRODUODENOSCOPY (EGD) WITH PROPOFOL N/A 02/10/2018   Procedure: ESOPHAGOGASTRODUODENOSCOPY (EGD) WITH PROPOFOL;  Surgeon: Lin Landsman, MD;  Location: [place];  Service: Gastroenterology;  Laterality: N/A;   ESOPHAGOGASTRODUODENOSCOPY (EGD) WITH PROPOFOL N/A 04/14/2020   Procedure: ESOPHAGOGASTRODUODENOSCOPY (EGD) WITH PROPOFOL;  Surgeon: Lin Landsman, MD;  Location: [place];  Service: Endoscopy;  Laterality: N/A;  diabetic - insulin   I & D EXTREMITY Right 04/03/2017   Procedure: IRRIGATION AND DEBRIDEMENT RIGHT LEG, APPLY WOUND VAC;  Surgeon: Newt Minion, MD;  Location: [place];  Service: Orthopedics;  Laterality: Right;   I & D EXTREMITY Right 04/05/2017   Procedure: IRRIGATION AND DEBRIDEMENT RIGHT LEG;  Surgeon: Newt Minion, MD;  Location: [place];  Service: Orthopedics;  Laterality: Right;   IRRIGATION AND DEBRIDEMENT ABSCESS Right 12/30/2018   Procedure: MINOR INCISION AND DRAINAGE OF ABSCESS;  Surgeon: Verner Mould, MD;  Location: [facility];  Service: Orthopedics;  Laterality: Right;   SCROTAL SURGERY  2006   SKIN SPLIT GRAFT Right 04/10/2017   Procedure: SKIN GRAFT SPLIT THICKNESS RIGHT LEG;  Surgeon: Newt Minion, MD;  Location: [place];  Service: Orthopedics;  Laterality: Right;   XI  ROBOTIC ASSISTED INGUINAL HERNIA REPAIR WITH MESH Bilateral 09/01/2020   Procedure: XI ROBOTIC ASSISTED INGUINAL HERNIA REPAIR WITH MESH;  Surgeon: Benjamine Sprague, DO;  Location: [facility];  Service: General;  Laterality: Bilateral;    Social History:  Social History   Socioeconomic History   Marital status: Single    Spouse name: Not on file   Number of children: Not on file   Years of education: Not on file   Highest education level: Not on file  Occupational History   Not on file  Tobacco Use   Smoking status: Never   Smokeless tobacco: Never  Vaping Use   Vaping Use: Never used  Substance and Sexual Activity   Alcohol use: No   Drug use: No   Sexual activity: Never  Other Topics Concern   Not on file  Social History Narrative   ** Merged History Encounter **       Social Determinants of Health   Financial Resource Strain: Not on file  Food Insecurity: Not on file  Transportation Needs: Not on file  Physical Activity: Not on file  Stress: Not on file  Social Connections: Not on file  Intimate Partner Violence: Not on file    Family History:  Family History  Problem Relation Age of Onset   Hypertension Mother    Stroke Mother    Diabetes Father    Renal Disease Father    Hypertension Sister    Diabetes Sister    Renal Disease Sister    Iron deficiency Sister     Medications:   Current Outpatient Medications on File Prior to Visit  Medication Sig Dispense Refill   ACCU-CHEK FASTCLIX LANCETS MISC U UTD TO CHECK BLOOD SUGAR TID  4   ACCU-CHEK GUIDE test strip USE TO TEST QID UTD  1   amLODipine (NORVASC) 10 MG tablet Take 10 mg by mouth every morning.     aspirin EC 81 MG EC tablet Take 1 tablet (81 mg total) by mouth daily. Swallow whole. 30 tablet 1   atorvastatin (LIPITOR) 10 MG tablet Take 2 tablets (20 mg total) by mouth daily. 60 tablet 0   glucose blood test strip Use 4 (four) times daily Use as instructed.     hydrALAZINE (APRESOLINE) 100 MG tablet  Take 100 mg by mouth 3 (three) times daily.     hydrochlorothiazide (HYDRODIURIL) 25 MG tablet Take 25 mg by mouth every morning.  6   HYDROcodone-acetaminophen (NORCO) 5-325 MG tablet Take 1 tablet by mouth every 6 (six) hours as needed for up to 6 doses for moderate pain. 6 tablet 0   ibuprofen (ADVIL) 800 MG tablet Take 1 tablet (800 mg total) by mouth every 8 (eight) hours as needed for mild pain or moderate pain. 30 tablet 0   insulin aspart (NOVOLOG) 100 UNIT/ML FlexPen Inject 10-15 Units into the skin 4 (four) times daily. Sliding scale     insulin glargine (LANTUS) 100 UNIT/ML injection Inject 30 Units into the skin in the morning.  lisinopril (PRINIVIL,ZESTRIL) 40 MG tablet Take 40 mg by mouth every morning.  2   metoprolol tartrate (LOPRESSOR) 100 MG tablet Take 1 tablet (100 mg total) by mouth 2 (two) times daily.     No current facility-administered medications on file prior to visit.    Allergies:  No Known Allergies    OBJECTIVE:  Physical Exam  There were no vitals filed for this visit. There is no height or weight on file to calculate BMI. No results found.  Depression screen [facility] 2/9 02/05/2019  Decreased Interest 0  Down, Depressed, Hopeless 0  PHQ - 2 Score 0     General: well developed, well nourished, seated, in no evident distress Head: head normocephalic and atraumatic.   Neck: supple with no carotid or supraclavicular bruits Cardiovascular: regular rate and rhythm, no murmurs Musculoskeletal: no deformity Skin:  no rash/petichiae Vascular:  Normal pulses all extremities   Neurologic Exam Mental Status: Awake and fully alert. Oriented to place and time. Recent and remote memory intact. Attention span, concentration and fund of knowledge appropriate. Mood and affect appropriate.  Cranial Nerves: Fundoscopic exam reveals sharp disc margins. Pupils equal, briskly reactive to light. Extraocular movements full without nystagmus. Visual fields full to  confrontation. Hearing intact. Facial sensation intact. Face, tongue, palate moves normally and symmetrically.  Motor: Normal bulk and tone. Normal strength in all tested extremity muscles Sensory.: intact to touch , pinprick , position and vibratory sensation.  Coordination: Rapid alternating movements normal in all extremities. Finger-to-nose and heel-to-shin performed accurately bilaterally. Gait and Station: Arises from chair without difficulty. Stance is normal. Gait demonstrates normal stride length and balance with ***. Tandem walk and heel toe ***.  Reflexes: 1+ and symmetric. Toes downgoing.     NIHSS  *** Modified [place]  ***      ASSESSMENT: [name] is a 50 y.o. year old male with recent likely left brain TIA on 11/25/2020 after presenting with right-sided weakness and incidental small right CR infarct likely due to small vessel disease however given bilateral location, recommended 30-day cardiac event monitor outpatient to rule out A. fib.  Vascular risk factors include HTN, HLD, DM and right-sided Bell's palsy with residual right facial weakness on 10/04/2020.      PLAN:  L brain TIA, R CR stroke: Residual deficit: ***. Continue aspirin 81 mg daily  and atorvastatin 20 mg daily for secondary stroke prevention.  Discussed secondary stroke prevention measures and importance of close PCP follow up for aggressive stroke risk factor management  HTN: BP goal <130/90.  Stable on amlodipine, hydralazine, hydrochlorothiazide and lisinopril per PCP HLD: LDL goal <70. Recent LDL 51 -started atorvastatin 20 mg daily.  DMII: A1c goal<7.0. Recent A1c 5.8, insulin.     Follow up in *** or call earlier if needed   CC:  [facility] provider: Dr. Leonie Man PCP: Tracie Harrier, MD    I spent *** minutes of face-to-face and non-face-to-face time with patient.  This included previsit chart review, lab review, study review, order entry, electronic health record documentation, patient  education regarding recent stroke, residual deficits, importance of managing stroke risk factors and answered all questions to patient satisfaction     Frann Rider, [facility]  [facility] [address] [place], [place] [address]  Phone [phone] Fax [phone] Note: This document was prepared with digital dictation and possible smart phrase technology. Any transcriptional errors that result from this process are unintentional.

## 2021-01-24 ENCOUNTER — Inpatient Hospital Stay: Payer: MEDICAID | Admitting: Adult Health

## 2021-01-24 ENCOUNTER — Telehealth: Payer: Self-pay | Admitting: *Deleted

## 2021-01-24 ENCOUNTER — Telehealth: Payer: Self-pay

## 2021-01-24 ENCOUNTER — Encounter: Payer: Self-pay | Admitting: Adult Health

## 2021-01-24 NOTE — Telephone Encounter (Signed)
error 

## 2021-01-24 NOTE — Telephone Encounter (Signed)
Pt no showed 01/24/21 appt with JM, NP.

## 2021-09-28 ENCOUNTER — Emergency Department (HOSPITAL_COMMUNITY)
Admission: EM | Admit: 2021-09-28 | Discharge: 2021-09-28 | Disposition: A | Discharge status: Home / Self Care | Payer: Self-pay | Attending: Emergency Medicine | Admitting: Emergency Medicine

## 2021-09-28 ENCOUNTER — Emergency Department (HOSPITAL_COMMUNITY): Payer: Self-pay

## 2021-09-28 ENCOUNTER — Encounter (HOSPITAL_COMMUNITY): Payer: Self-pay

## 2021-09-28 ENCOUNTER — Other Ambulatory Visit: Payer: Self-pay

## 2021-09-28 ENCOUNTER — Emergency Department (HOSPITAL_BASED_OUTPATIENT_CLINIC_OR_DEPARTMENT_OTHER): Payer: Self-pay

## 2021-09-28 DIAGNOSIS — M79672 Pain in left foot: Secondary | ICD-10-CM | POA: Diagnosis not present

## 2021-09-28 DIAGNOSIS — R609 Edema, unspecified: Secondary | ICD-10-CM

## 2021-09-28 DIAGNOSIS — I10 Essential (primary) hypertension: Secondary | ICD-10-CM | POA: Insufficient documentation

## 2021-09-28 DIAGNOSIS — Z79899 Other long term (current) drug therapy: Secondary | ICD-10-CM | POA: Insufficient documentation

## 2021-09-28 DIAGNOSIS — Z794 Long term (current) use of insulin: Secondary | ICD-10-CM | POA: Insufficient documentation

## 2021-09-28 DIAGNOSIS — E119 Type 2 diabetes mellitus without complications: Secondary | ICD-10-CM | POA: Insufficient documentation

## 2021-09-28 LAB — BASIC METABOLIC PANEL
Anion gap: 6 (ref 5–15)
BUN: 23 mg/dL — ABNORMAL HIGH (ref 6–20)
CO2: 26 mmol/L (ref 22–32)
Calcium: 8.8 mg/dL — ABNORMAL LOW (ref 8.9–10.3)
Chloride: 103 mmol/L (ref 98–111)
Creatinine, Ser: 1.51 mg/dL — ABNORMAL HIGH (ref 0.61–1.24)
GFR, Estimated: 56 mL/min — ABNORMAL LOW (ref >60)
Glucose, Bld: 121 mg/dL — ABNORMAL HIGH (ref 70–99)
Potassium: 4.2 mmol/L (ref 3.5–5.1)
Sodium: 135 mmol/L (ref 135–145)

## 2021-09-28 LAB — CBC WITH DIFFERENTIAL/PLATELET
Abs Immature Granulocytes: 0.01 10*3/uL (ref 0.00–0.07)
Basophils Absolute: 0 10*3/uL (ref 0.0–0.1)
Basophils Relative: 0 %
Eosinophils Absolute: 0 10*3/uL (ref 0.0–0.5)
Eosinophils Relative: 0 %
HCT: 46.5 % (ref 39.0–52.0)
Hemoglobin: 15.1 g/dL (ref 13.0–17.0)
Immature Granulocytes: 0 %
Lymphocytes Relative: 16 %
Lymphs Abs: 1.1 10*3/uL (ref 0.7–4.0)
MCH: 29.5 pg (ref 26.0–34.0)
MCHC: 32.5 g/dL (ref 30.0–36.0)
MCV: 91 fL (ref 80.0–100.0)
Monocytes Absolute: 0.5 10*3/uL (ref 0.1–1.0)
Monocytes Relative: 8 %
Neutro Abs: 5.2 10*3/uL (ref 1.7–7.7)
Neutrophils Relative %: 76 %
Platelets: 249 10*3/uL (ref 150–400)
RBC: 5.11 MIL/uL (ref 4.22–5.81)
RDW: 12.7 % (ref 11.5–15.5)
WBC: 7 10*3/uL (ref 4.0–10.5)
nRBC: 0 % (ref 0.0–0.2)

## 2021-09-28 MED ORDER — COLCHICINE 0.6 MG PO TABS
1.2000 mg | ORAL_TABLET | Freq: Once | ORAL | Status: AC
  Administered 2021-09-28: 1.2 mg via ORAL
  Filled 2021-09-28: qty 2

## 2021-09-28 MED ORDER — OXYCODONE HCL 5 MG PO TABS
5.0000 mg | ORAL_TABLET | Freq: Once | ORAL | Status: AC
  Administered 2021-09-28: 5 mg via ORAL
  Filled 2021-09-28: qty 1

## 2021-09-28 MED ORDER — COLCHICINE 0.6 MG PO TABS
0.6000 mg | ORAL_TABLET | Freq: Once | ORAL | Status: AC
  Administered 2021-09-28: 0.6 mg via ORAL
  Filled 2021-09-28: qty 1

## 2021-09-28 MED ORDER — COLCHICINE 0.6 MG PO TABS: 0.6000 mg | ORAL_TABLET | Freq: Every day | ORAL | 0 refills | Status: DC

## 2021-09-28 MED ORDER — ACETAMINOPHEN 325 MG PO TABS: 650.0000 mg | ORAL_TABLET | Freq: Four times a day (QID) | ORAL | 0 refills | Status: AC | PRN

## 2021-09-28 MED ORDER — KETOROLAC TROMETHAMINE 15 MG/ML IJ SOLN
15.0000 mg | Freq: Once | INTRAMUSCULAR | Status: AC
  Administered 2021-09-28: 15 mg via INTRAVENOUS
  Filled 2021-09-28: qty 1

## 2021-09-28 MED ORDER — ACETAMINOPHEN 500 MG PO TABS
1000.0000 mg | ORAL_TABLET | Freq: Once | ORAL | Status: AC
  Administered 2021-09-28: 1000 mg via ORAL
  Filled 2021-09-28: qty 2

## 2021-09-28 MED ORDER — OXYCODONE HCL 5 MG PO TABS: 5.0000 mg | ORAL_TABLET | Freq: Four times a day (QID) | ORAL | 0 refills | Status: DC | PRN

## 2021-09-28 NOTE — Discharge Instructions (Addendum)
Please follow up with your primary care provider next week in the office. ? ?Your ultrasound did not show signs of a blood clot in your leg.  ? ?It is possible you are experiencing gout or else pain from poor blood flow in the leg.  We can try to treat this for the next week with pain medications at home, as well as colchicine.  If the swelling and pain improved, is likely related to gout.  If you continue having pain moving forward, you may need to follow-up with a vascular clinic.  I provided you the office number for them.  You should discuss this with your primary care doctor. ?

## 2021-09-28 NOTE — ED Provider Notes (Signed)
Pt is here with leg swelling, pending DVT study ?Has some redness and pain of lower extremity ? ?Patient has had about a week of left leg pain and swelling.  He says his foot is aching and has difficulty bearing weight.  He does report a history of similar symptoms in the right foot many years ago which come and go.  He was told he has gout in the past as well. ? ?Labs and work-up were reviewed from earlier provider evaluation.  The patient has no leukocytosis.  His glucose level is fairly well under control, he is a type 2 diabetic on insulin (10-15 units short acting TID).  Creatinine level at baseline. ? ?On exam he does have edema of the left lower extremity, there is warmth of the bilateral lower extremities.  There is smoothness of the skin which suggest possibly some vascular disease.  He does have present pedal pulses.  There are some tenderness of the ankle joint and also the dorsum of the foot.  No significant erythema or warmth to suggest cellulitis.  Overall my suspicion for a septic joint is relatively low, given that he is afebrile, has no leukocytosis, no other nidus of infection.  This may be a gout flareup.  He was started on colchicine by the earlier provider.  He was given additional pain medications in the ED and can be prescribed some for home. ? ?DVT ultrasound is personally reviewed and interpreted, showing no acute DVT ? ?Patient to be discharged on pain medications, colchine, advised PCP f/u early next week, work note provided for home. ? ? ? ? ?  ?Wyvonnia Dusky, MD ?09/28/21 [phone] ? ?

## 2021-09-28 NOTE — ED Provider Notes (Signed)
?[facility] DEPT ?Provider Note ? ? ?CSN: [id number] ?Arrival date & time: 09/28/21  6962 ? ?  ? ?History ? ?Chief Complaint  ?Patient presents with  ? Foot Pain  ? Foot Swelling  ? ? ?[name] is a 51 y.o. male. ? ?The history is provided by the patient and medical records.  ?Foot Pain ? ?[name] is a 51 y.o. male who presents to the Emergency Department complaining of foot pain.  He presents emergency department complaining of 1 week of atraumatic left foot pain.  Pain is located in the distal midfoot and great toe.  It is constant in nature.  He does have associated swelling.  Pain has worsened over the last 24 hours, prompting ED visit.  Pain is worse with weightbearing.  No associated fevers, chest pain, difficulty breathing, nausea, vomiting.  No known injuries to this foot.  He has a history of diabetes and hypertension.  He is very good about taking his medications.  He does have a remote history of gout but does not feel like this feels like his prior gout. ? ?He does have a remote history of DVT in the right lower extremity. ? ?  ? ?Home Medications ?Prior to Admission medications   ?Medication Sig Start Date End Date Taking? Authorizing Provider  ?amLODipine (NORVASC) 10 MG tablet Take 10 mg by mouth every morning.   Yes [provider]  ?colchicine 0.6 MG tablet Take 1 tablet (0.6 mg total) by mouth daily. 09/28/21  Yes Quintella Reichert, MD  ?hydrALAZINE (APRESOLINE) 100 MG tablet Take 100 mg by mouth 3 (three) times daily. 12/25/19  Yes [provider]  ?hydrochlorothiazide (HYDRODIURIL) 25 MG tablet Take 25 mg by mouth every morning. 05/08/18  Yes [provider]  ?ibuprofen (ADVIL) 800 MG tablet Take 1 tablet (800 mg total) by mouth every 8 (eight) hours as needed for mild pain or moderate pain. 09/01/20  Yes Sakai, Isami, DO  ?insulin aspart (NOVOLOG) 100 UNIT/ML FlexPen Inject 10-15 Units into the skin 4 (four) times daily.  Sliding scale   Yes [provider]  ?lisinopril (PRINIVIL,ZESTRIL) 40 MG tablet Take 40 mg by mouth every morning. 05/09/18  Yes [provider]  ?metoprolol tartrate (LOPRESSOR) 100 MG tablet Take 1 tablet (100 mg total) by mouth 2 (two) times daily. 04/12/17  Yes Hosie Poisson, MD  ?ACCU-CHEK FASTCLIX LANCETS MISC U UTD TO CHECK BLOOD SUGAR TID 01/13/18   [provider]  ?ACCU-CHEK GUIDE test strip USE TO TEST QID UTD 12/13/17   [provider]  ?glucose blood test strip Use 4 (four) times daily Use as instructed. 11/13/17   [provider]  ?   ? ?Allergies    ?Patient has no known allergies.   ? ?Review of Systems   ?Review of Systems  ?All other systems reviewed and are negative. ? ?Physical Exam ?Updated Vital Signs ?BP (!) 168/98 (BP Location: Right Arm)   Pulse 74   Temp 98.2 ?F (36.8 ?C) (Oral)   Resp 18   Ht '5\' 11"'$  (1.803 m)   Wt 113.4 kg   SpO2 100%   BMI 34.87 kg/m?  ?Physical Exam ?Vitals and nursing note reviewed.  ?Constitutional:   ?   Appearance: He is well-developed.  ?HENT:  ?   Head: Normocephalic and atraumatic.  ?Cardiovascular:  ?   Rate and Rhythm: Normal rate and regular rhythm.  ?   Heart sounds: No murmur heard. ?Pulmonary:  ?  Effort: Pulmonary effort is normal. No respiratory distress.  ?   Breath sounds: Normal breath sounds.  ?Abdominal:  ?   Palpations: Abdomen is soft.  ?   Tenderness: There is no abdominal tenderness. There is no guarding or rebound.  ?Musculoskeletal:  ?   Comments: 2+ DP pulses bilaterally.  There is soft tissue swelling to bilateral lower extremities, left greater than right.  There is a dressing in place to the right ankle that is clean and dry.  Patient states he has a chronic wound in this area.  The left foot is without wounds.  There is no overt erythema.  He does have tenderness to palpation over the left first MTP joint without effusion.  He also does have some tenderness to the midfoot.  He is able to  wiggle all the toes.  ?Skin: ?   General: Skin is warm and dry.  ?Neurological:  ?   Mental Status: He is alert and oriented to person, place, and time.  ?Psychiatric:     ?   Behavior: Behavior normal.  ? ? ?ED Results / Procedures / Treatments   ?Labs ?(all labs ordered are listed, but only abnormal results are displayed) ?Labs Reviewed  ?BASIC METABOLIC PANEL - Abnormal; Notable for the following components:  ?    Result Value  ? Glucose, Bld 121 (*)   ? BUN 23 (*)   ? Creatinine, Ser 1.51 (*)   ? Calcium 8.8 (*)   ? GFR, Estimated 56 (*)   ? All other components within normal limits  ?CBC WITH DIFFERENTIAL/PLATELET  ? ? ?EKG ?None ? ?Radiology ?DG Foot Complete Left ? ?Result Date: 09/28/2021 ?CLINICAL DATA:  Foot pain. EXAM: LEFT FOOT - COMPLETE 3+ VIEW COMPARISON:  None. FINDINGS: There are second through fifth hammertoe deformities. No underlying acute fracture or dislocation. Mild dorsal soft tissue swelling is identified. IMPRESSION: 1. No acute findings. 2. Hammertoe deformities. 3. Dorsal soft tissue swelling. Electronically Signed   By: Kerby Moors M.D.   On: 09/28/2021 05:56   ? ?Procedures ?Procedures  ? ? ?Medications Ordered in ED ?Medications  ?colchicine tablet 1.2 mg (1.2 mg Oral Given 09/28/21 0436)  ?colchicine tablet 0.6 mg (0.6 mg Oral Given 09/28/21 0542)  ?acetaminophen (TYLENOL) tablet 1,000 mg (1,000 mg Oral Given 09/28/21 0641)  ? ? ?ED Course/ Medical Decision Making/ A&P ?  ?                        ?Medical Decision Making ?Amount and/or Complexity of Data Reviewed ?Labs: ordered. ?Radiology: ordered. ? ?Risk ?OTC drugs. ?Prescription drug management. ? ? ?Patient with history of hypertension, diabetes here for evaluation of 1 week of atraumatic left foot pain.Marland Kitchen  He states a history of gout but has never had a fluid analysis of joint fluid.  He also has a history of DVT.  On examination his foot is well-perfused.  He has diffuse tenderness to palpation over the distal foot and digits  without any overlying skin changes consistent with a cellulitis.  Current clinical picture is not consistent with limb ischemia, septic arthritis.  He was started on empiric treatment for possible gouty arthritis.  Given his history of DVT will obtain vascular ultrasound to rule out DVT.  Patient care transferred pending imaging. ? ? ? ? ? ? ? ?Final Clinical Impression(s) / ED Diagnoses ?Final diagnoses:  ?Foot pain, left  ? ? ?Rx / DC Orders ?ED Discharge Orders   ? ?  Ordered  ?  colchicine 0.6 MG tablet  Daily       ? 09/28/21 0630  ? ?  ?  ? ?  ? ? ?  ?Quintella Reichert, MD ?09/28/21 [phone] ? ?

## 2021-09-28 NOTE — ED Triage Notes (Signed)
Pt reports with left foot pain and swelling x 1 week. Pt reports pain on the top, sides, and bottom.  ?

## 2021-09-28 NOTE — Progress Notes (Signed)
Left lower extremity venous duplex has been completed. ?Preliminary results can be found in CV Proc through chart review.  ?Results were given to Dr. Langston Masker. ? ?09/28/21 8:58 AM ?Carlos Levering RVT   ?

## 2022-06-29 ENCOUNTER — Emergency Department (HOSPITAL_COMMUNITY): Payer: Medicaid Other

## 2022-06-29 ENCOUNTER — Emergency Department (HOSPITAL_COMMUNITY)
Admission: EM | Admit: 2022-06-29 | Discharge: 2022-06-30 | Discharge status: Against Medical Advice | Payer: Medicaid Other | Attending: Physician Assistant | Admitting: Physician Assistant

## 2022-06-29 DIAGNOSIS — I10 Essential (primary) hypertension: Secondary | ICD-10-CM | POA: Insufficient documentation

## 2022-06-29 DIAGNOSIS — X58XXXA Exposure to other specified factors, initial encounter: Secondary | ICD-10-CM | POA: Diagnosis not present

## 2022-06-29 DIAGNOSIS — E119 Type 2 diabetes mellitus without complications: Secondary | ICD-10-CM | POA: Insufficient documentation

## 2022-06-29 DIAGNOSIS — Z5321 Procedure and treatment not carried out due to patient leaving prior to being seen by health care provider: Secondary | ICD-10-CM | POA: Diagnosis not present

## 2022-06-29 DIAGNOSIS — Z86718 Personal history of other venous thrombosis and embolism: Secondary | ICD-10-CM | POA: Insufficient documentation

## 2022-06-29 DIAGNOSIS — R600 Localized edema: Secondary | ICD-10-CM | POA: Diagnosis not present

## 2022-06-29 DIAGNOSIS — R944 Abnormal results of kidney function studies: Secondary | ICD-10-CM | POA: Insufficient documentation

## 2022-06-29 DIAGNOSIS — S81801A Unspecified open wound, right lower leg, initial encounter: Secondary | ICD-10-CM | POA: Diagnosis not present

## 2022-06-29 DIAGNOSIS — Z79899 Other long term (current) drug therapy: Secondary | ICD-10-CM | POA: Diagnosis not present

## 2022-06-29 DIAGNOSIS — M25571 Pain in right ankle and joints of right foot: Secondary | ICD-10-CM | POA: Insufficient documentation

## 2022-06-29 DIAGNOSIS — Z794 Long term (current) use of insulin: Secondary | ICD-10-CM | POA: Diagnosis not present

## 2022-06-29 DIAGNOSIS — M109 Gout, unspecified: Secondary | ICD-10-CM | POA: Insufficient documentation

## 2022-06-29 DIAGNOSIS — M79671 Pain in right foot: Secondary | ICD-10-CM | POA: Diagnosis not present

## 2022-06-29 DIAGNOSIS — M7989 Other specified soft tissue disorders: Secondary | ICD-10-CM | POA: Diagnosis not present

## 2022-06-29 NOTE — ED Provider Triage Note (Signed)
Emergency Medicine Provider Triage Evaluation Note  [name] , a 51 y.o. male  was evaluated in triage.  Pt complains of right foot and ankle pain and swelling.  States he started noticing an ulcer to his right ankle.  Unknown warmth.  History of necrotizing fasciitis.  Denies fever.  Typically wraps his lower extremities. Hx of DVT  Poor historian  Review of Systems  Positive: Right leg pain, swelling Negative: Fever, numbness, weakness  Physical Exam  There were no vitals taken for this visit. Gen:   Awake, no distress   Resp:  Normal effort  MSK:   Moves extremities without difficulty, RLE swelling Other:    Medical Decision Making  Medically screening exam initiated at 11:37 PM.  Appropriate orders placed.  [name] was informed that the remainder of the evaluation will be completed by another provider, this initial triage assessment does not replace that evaluation, and the importance of remaining in the ED until their evaluation is complete.  Right foot pain, swelling   Laisha Rau A, PA-C 06/29/22 2339

## 2022-06-29 NOTE — ED Triage Notes (Signed)
Patient coming to ED for evaluation of R foot pain and swelling.  Reports he noticed swelling on Wednesday.  Currently has a wound on R lower leg that "is healing very slowly."  Has bandage in place.  No reports of numbness or tingling.  Severe pain with weight bearing.  No recent falls.

## 2022-06-30 ENCOUNTER — Other Ambulatory Visit: Payer: Self-pay

## 2022-06-30 ENCOUNTER — Encounter (HOSPITAL_COMMUNITY): Payer: Self-pay

## 2022-06-30 ENCOUNTER — Emergency Department (HOSPITAL_COMMUNITY): Payer: Medicaid Other

## 2022-06-30 ENCOUNTER — Emergency Department (HOSPITAL_COMMUNITY)
Admission: EM | Admit: 2022-06-30 | Discharge: 2022-06-30 | Disposition: A | Discharge status: Home / Self Care | Payer: Medicaid Other | Source: Home / Self Care | Attending: Emergency Medicine | Admitting: Emergency Medicine

## 2022-06-30 DIAGNOSIS — E119 Type 2 diabetes mellitus without complications: Secondary | ICD-10-CM | POA: Insufficient documentation

## 2022-06-30 DIAGNOSIS — R944 Abnormal results of kidney function studies: Secondary | ICD-10-CM | POA: Insufficient documentation

## 2022-06-30 DIAGNOSIS — Z794 Long term (current) use of insulin: Secondary | ICD-10-CM | POA: Insufficient documentation

## 2022-06-30 DIAGNOSIS — S81801A Unspecified open wound, right lower leg, initial encounter: Secondary | ICD-10-CM | POA: Insufficient documentation

## 2022-06-30 DIAGNOSIS — Z79899 Other long term (current) drug therapy: Secondary | ICD-10-CM | POA: Insufficient documentation

## 2022-06-30 DIAGNOSIS — M7989 Other specified soft tissue disorders: Secondary | ICD-10-CM | POA: Diagnosis not present

## 2022-06-30 DIAGNOSIS — X58XXXA Exposure to other specified factors, initial encounter: Secondary | ICD-10-CM | POA: Insufficient documentation

## 2022-06-30 DIAGNOSIS — I10 Essential (primary) hypertension: Secondary | ICD-10-CM | POA: Insufficient documentation

## 2022-06-30 DIAGNOSIS — M79671 Pain in right foot: Secondary | ICD-10-CM | POA: Diagnosis not present

## 2022-06-30 DIAGNOSIS — T148XXA Other injury of unspecified body region, initial encounter: Secondary | ICD-10-CM

## 2022-06-30 DIAGNOSIS — R600 Localized edema: Secondary | ICD-10-CM | POA: Diagnosis not present

## 2022-06-30 LAB — BASIC METABOLIC PANEL
Anion gap: 7 (ref 5–15)
BUN: 20 mg/dL (ref 6–20)
CO2: 27 mmol/L (ref 22–32)
Calcium: 9.3 mg/dL (ref 8.9–10.3)
Chloride: 107 mmol/L (ref 98–111)
Creatinine, Ser: 1.55 mg/dL — ABNORMAL HIGH (ref 0.61–1.24)
GFR, Estimated: 54 mL/min — ABNORMAL LOW (ref >60)
Glucose, Bld: 66 mg/dL — ABNORMAL LOW (ref 70–99)
Potassium: 4 mmol/L (ref 3.5–5.1)
Sodium: 141 mmol/L (ref 135–145)

## 2022-06-30 LAB — CBC WITH DIFFERENTIAL/PLATELET
Abs Immature Granulocytes: 0 10*3/uL (ref 0.00–0.07)
Abs Immature Granulocytes: 0.02 10*3/uL (ref 0.00–0.07)
Basophils Absolute: 0 10*3/uL (ref 0.0–0.1)
Basophils Absolute: 0 10*3/uL (ref 0.0–0.1)
Basophils Relative: 0 %
Basophils Relative: 0 %
Eosinophils Absolute: 0 10*3/uL (ref 0.0–0.5)
Eosinophils Absolute: 0 10*3/uL (ref 0.0–0.5)
Eosinophils Relative: 1 %
Eosinophils Relative: 1 %
HCT: 50.5 % (ref 39.0–52.0)
HCT: 51.6 % (ref 39.0–52.0)
Hemoglobin: 15.9 g/dL (ref 13.0–17.0)
Hemoglobin: 16.3 g/dL (ref 13.0–17.0)
Immature Granulocytes: 0 %
Immature Granulocytes: 0 %
Lymphocytes Relative: 24 %
Lymphocytes Relative: 34 %
Lymphs Abs: 1.3 10*3/uL (ref 0.7–4.0)
Lymphs Abs: 1.6 10*3/uL (ref 0.7–4.0)
MCH: 28.9 pg (ref 26.0–34.0)
MCH: 29.1 pg (ref 26.0–34.0)
MCHC: 31.5 g/dL (ref 30.0–36.0)
MCHC: 31.6 g/dL (ref 30.0–36.0)
MCV: 91.7 fL (ref 80.0–100.0)
MCV: 92.1 fL (ref 80.0–100.0)
Monocytes Absolute: 0.4 10*3/uL (ref 0.1–1.0)
Monocytes Absolute: 0.4 10*3/uL (ref 0.1–1.0)
Monocytes Relative: 8 %
Monocytes Relative: 9 %
Neutro Abs: 2.5 10*3/uL (ref 1.7–7.7)
Neutro Abs: 3.5 10*3/uL (ref 1.7–7.7)
Neutrophils Relative %: 56 %
Neutrophils Relative %: 67 %
Platelets: 223 10*3/uL (ref 150–400)
Platelets: 231 10*3/uL (ref 150–400)
RBC: 5.51 MIL/uL (ref 4.22–5.81)
RBC: 5.6 MIL/uL (ref 4.22–5.81)
RDW: 13.3 % (ref 11.5–15.5)
RDW: 13.5 % (ref 11.5–15.5)
WBC: 4.6 10*3/uL (ref 4.0–10.5)
WBC: 5.2 10*3/uL (ref 4.0–10.5)
nRBC: 0 % (ref 0.0–0.2)
nRBC: 0 % (ref 0.0–0.2)

## 2022-06-30 LAB — COMPREHENSIVE METABOLIC PANEL
ALT: 20 U/L (ref 0–44)
AST: 20 U/L (ref 15–41)
Albumin: 3.7 g/dL (ref 3.5–5.0)
Alkaline Phosphatase: 69 U/L (ref 38–126)
Anion gap: 6 (ref 5–15)
BUN: 28 mg/dL — ABNORMAL HIGH (ref 6–20)
CO2: 28 mmol/L (ref 22–32)
Calcium: 8.8 mg/dL — ABNORMAL LOW (ref 8.9–10.3)
Chloride: 102 mmol/L (ref 98–111)
Creatinine, Ser: 1.66 mg/dL — ABNORMAL HIGH (ref 0.61–1.24)
GFR, Estimated: 50 mL/min — ABNORMAL LOW (ref >60)
Glucose, Bld: 100 mg/dL — ABNORMAL HIGH (ref 70–99)
Potassium: 3.8 mmol/L (ref 3.5–5.1)
Sodium: 136 mmol/L (ref 135–145)
Total Bilirubin: 0.5 mg/dL (ref 0.3–1.2)
Total Protein: 7.4 g/dL (ref 6.5–8.1)

## 2022-06-30 LAB — LACTIC ACID, PLASMA: Lactic Acid, Venous: 0.8 mmol/L (ref 0.5–1.9)

## 2022-06-30 NOTE — ED Provider Notes (Incomplete)
[facility] EMERGENCY DEPARTMENT Provider Note   CSN: [id number] Arrival date & time: 06/30/22  1837     History {Add pertinent medical, surgical, social history, OB history to HPI:1} Chief Complaint  Patient presents with   Foot Problem    [name] is a 51 y.o. male.  Pt is a 51 yo male with a pmhx significant for htn, dm, gout, dvt, nec fasc in 2018, and chronic ulcer RLE since then.  Pt had been going to wound care, but the wound care clinic was in [place].  He lost his car and was no longer able to make it to the appointments.  So, he's been caring for the wound.  Pt said his right foot has been swelling.  He went to [facility] yesterday evening and waited all night and day and was never seen, so he left and came here.  Pt denies fevers.  No n/v.       Home Medications Prior to Admission medications   Medication Sig Start Date End Date Taking? Authorizing Provider  ACCU-CHEK FASTCLIX LANCETS MISC U UTD TO CHECK BLOOD SUGAR TID 01/13/18   [provider]  ACCU-CHEK GUIDE test strip USE TO TEST QID UTD 12/13/17   [provider]  acetaminophen (TYLENOL) 325 MG tablet Take 2 tablets (650 mg total) by mouth every 6 (six) hours as needed for up to 30 doses. 09/28/21   Wyvonnia Dusky, MD  amLODipine (NORVASC) 10 MG tablet Take 10 mg by mouth every morning.    [provider]  colchicine 0.6 MG tablet Take 1 tablet (0.6 mg total) by mouth daily. 09/28/21   Quintella Reichert, MD  glucose blood test strip Use 4 (four) times daily Use as instructed. 11/13/17   [provider]  hydrALAZINE (APRESOLINE) 100 MG tablet Take 100 mg by mouth 3 (three) times daily. 12/25/19   [provider]  hydrochlorothiazide (HYDRODIURIL) 25 MG tablet Take 25 mg by mouth every morning. 05/08/18   [provider]  ibuprofen (ADVIL) 800 MG tablet Take 1 tablet (800 mg total) by mouth every 8 (eight) hours as needed for mild pain or moderate pain. 09/01/20    Lysle Pearl, Isami, DO  insulin aspart (NOVOLOG) 100 UNIT/ML FlexPen Inject 10-15 Units into the skin 4 (four) times daily. Sliding scale    [provider]  lisinopril (PRINIVIL,ZESTRIL) 40 MG tablet Take 40 mg by mouth every morning. 05/09/18   [provider]  metoprolol tartrate (LOPRESSOR) 100 MG tablet Take 1 tablet (100 mg total) by mouth 2 (two) times daily. 04/12/17   Hosie Poisson, MD  oxyCODONE (ROXICODONE) 5 MG immediate release tablet Take 1 tablet (5 mg total) by mouth every 6 (six) hours as needed for up to 15 doses for severe pain. 09/28/21   Wyvonnia Dusky, MD      Allergies    Patient has no known allergies.    Review of Systems   Review of Systems  Skin:  Positive for wound.  All other systems reviewed and are negative.   Physical Exam Updated Vital Signs BP (!) 150/99   Pulse 68   Temp 99 F (37.2 C) (Oral)   Resp 19   Ht '5\' 11"'$  (1.803 m)   Wt 119.3 kg   SpO2 99%   BMI 36.68 kg/m  Physical Exam Vitals and nursing note reviewed.  Constitutional:      Appearance: Normal appearance. He is obese.  HENT:     Head: Normocephalic and  atraumatic.     Right Ear: External ear normal.     Left Ear: External ear normal.     Nose: Nose normal.     Mouth/Throat:     Mouth: Mucous membranes are moist.     Pharynx: Oropharynx is clear.  Eyes:     Extraocular Movements: Extraocular movements intact.     Conjunctiva/sclera: Conjunctivae normal.     Pupils: Pupils are equal, round, and reactive to light.  Cardiovascular:     Rate and Rhythm: Normal rate and regular rhythm.     Pulses: Normal pulses.     Heart sounds: Normal heart sounds.  Pulmonary:     Effort: Pulmonary effort is normal.     Breath sounds: Normal breath sounds.  Abdominal:     General: Abdomen is flat. Bowel sounds are normal.     Palpations: Abdomen is soft.  Musculoskeletal:     Cervical back: Normal range of motion and neck supple.     Comments: Pt has a very tight compression  dressing on leg.  Swelling to foot distal to the dressing likely due to the dressing being on so tight.  RLE nonhealing wound.  See picture.  Skin:    General: Skin is warm.     Capillary Refill: Capillary refill takes less than 2 seconds.  Neurological:     General: No focal deficit present.     Mental Status: He is alert and oriented to person, place, and time.  Psychiatric:        Mood and Affect: Mood normal.        Behavior: Behavior normal.        ED Results / Procedures / Treatments   Labs (all labs ordered are listed, but only abnormal results are displayed) Labs Reviewed  COMPREHENSIVE METABOLIC PANEL - Abnormal; Notable for the following components:      Result Value   Glucose, Bld 100 (*)    BUN 28 (*)    Creatinine, Ser 1.66 (*)    Calcium 8.8 (*)    GFR, Estimated 50 (*)    All other components within normal limits  CBC WITH DIFFERENTIAL/PLATELET  LACTIC ACID, PLASMA  LACTIC ACID, PLASMA    EKG None  Radiology DG Tibia/Fibula Right  Result Date: 06/30/2022 CLINICAL DATA:  Swelling wound EXAM: RIGHT TIBIA AND FIBULA - 2 VIEW COMPARISON:  None Available. FINDINGS: No fracture or malalignment. No periostitis or osseous destructive change. Soft tissue edema without emphysema. Irregularity of the skin surface distal anterior and lateral lower leg likely corresponding to history of wound/ulcer. Vascular calcifications. IMPRESSION: 1. No acute osseous abnormality. 2. Soft tissue edema without emphysema. Electronically Signed   By: Donavan Foil M.D.   On: 06/30/2022 20:15   DG Foot Complete Right  Result Date: 06/30/2022 CLINICAL DATA:  Right foot pain and swelling EXAM: RIGHT FOOT COMPLETE - 3+ VIEW; RIGHT ANKLE - COMPLETE 3+ VIEW COMPARISON:  None Available. FINDINGS: There is moderate diffuse soft tissue swelling of the right foot and at the right ankle. There is bandaging material above the ankle. No acute fracture. There is no soft tissue gas. IMPRESSION:  Moderate diffuse soft tissue swelling of the right foot and at the right ankle. No acute fracture. Electronically Signed   By: Ulyses Jarred M.D.   On: 06/30/2022 00:39   DG Ankle Complete Right  Result Date: 06/30/2022 CLINICAL DATA:  Right foot pain and swelling EXAM: RIGHT FOOT COMPLETE - 3+ VIEW; RIGHT ANKLE - COMPLETE  3+ VIEW COMPARISON:  None Available. FINDINGS: There is moderate diffuse soft tissue swelling of the right foot and at the right ankle. There is bandaging material above the ankle. No acute fracture. There is no soft tissue gas. IMPRESSION: Moderate diffuse soft tissue swelling of the right foot and at the right ankle. No acute fracture. Electronically Signed   By: Ulyses Jarred M.D.   On: 06/30/2022 00:39    Procedures Procedures  {Document cardiac monitor, telemetry assessment procedure when appropriate:1}  Medications Ordered in ED Medications - No data to display  ED Course/ Medical Decision Making/ A&P                           Medical Decision Making Amount and/or Complexity of Data Reviewed Labs: ordered. Radiology: ordered.   This patient presents to the ED for concern of wound, this involves an extensive number of treatment options, and is a complaint that carries with it a high risk of complications and morbidity.  The differential diagnosis includes cellulitis, dvt,    Co morbidities that complicate the patient evaluation  htn, dm, gout, dvt, nec fasc    Additional history obtained:  Additional history obtained from epic chart review  Lab Tests:  I Ordered, and personally interpreted labs.  The pertinent results include:  cbc nl, lactic nl, cmp with bun 28 and cr 1.66 (chronic)   Imaging Studies ordered:  I ordered imaging studies including tib/fib  I independently visualized and interpreted imaging which showed  1. No acute osseous abnormality.  2. Soft tissue edema without emphysema.   I agree with the radiologist  interpretation   Cardiac Monitoring:  The patient was maintained on a cardiac monitor.  I personally viewed and interpreted the cardiac monitored which showed an underlying rhythm of: nsr   Medicines ordered and prescription drug management:   I have reviewed the patients home medicines and have made adjustments as needed   Problem List / ED Course:  Right leg swelling:  swelling to the foot has gone down after removing the tight dressings.  Wound looks good.  No signs of infection.  Pt is mainly here to get a wound treatment referral.   Reevaluation:  After the interventions noted above, I reevaluated the patient and found that they have :improved   Social Determinants of Health:  Lives at home   Dispostion:  After consideration of the diagnostic results and the patients response to treatment, I feel that the patent would benefit from discharge with outpatient f/u.    {Document critical care time when appropriate:1} {Document review of labs and clinical decision tools ie heart score, Chads2Vasc2 etc:1}  {Document your independent review of radiology images, and any outside records:1} {Document your discussion with family members, caretakers, and with consultants:1} {Document social determinants of health affecting pt's care:1} {Document your decision making why or why not admission, treatments were needed:1} Final Clinical Impression(s) / ED Diagnoses Final diagnoses:  None    Rx / DC Orders ED Discharge Orders     None

## 2022-06-30 NOTE — ED Triage Notes (Signed)
Pt presents with R foot swelling that started on 12/13. Pt does have a diabetic ulcer to the RLE. Denies recent injury. Pt was seen at [facility] and had an XR but pt left after triage.

## 2022-07-02 ENCOUNTER — Telehealth: Payer: Self-pay

## 2022-07-02 NOTE — Patient Outreach (Signed)
Transition Care Management Unsuccessful Follow-up Telephone Call  Date of discharge and from where:  06/30/22 [facility]  Attempts:  1st Attempt  Reason for unsuccessful TCM follow-up call:  Left voice message  Mickel Fuchs, [place], [place] Medicaid Team  [phone]

## 2022-07-03 ENCOUNTER — Telehealth: Payer: Self-pay

## 2022-07-03 NOTE — Patient Outreach (Signed)
Transition Care Management Unsuccessful Follow-up Telephone Call  Date of discharge and from where:  06/30/22 [facility] hospital  Attempts:  2nd Attempt  Reason for unsuccessful TCM follow-up call:  Left voice message   Mickel Fuchs, BSW, [place] Medicaid Team  [phone]

## 2022-11-09 DIAGNOSIS — K5790 Diverticulosis of intestine, part unspecified, without perforation or abscess without bleeding: Secondary | ICD-10-CM | POA: Diagnosis not present

## 2022-11-09 DIAGNOSIS — Z86010 Personal history of colonic polyps: Secondary | ICD-10-CM | POA: Diagnosis not present

## 2022-11-11 ENCOUNTER — Emergency Department (HOSPITAL_COMMUNITY): Payer: Medicaid Other

## 2022-11-11 ENCOUNTER — Other Ambulatory Visit: Payer: Self-pay

## 2022-11-11 ENCOUNTER — Emergency Department (HOSPITAL_COMMUNITY)
Admission: EM | Admit: 2022-11-11 | Discharge: 2022-11-11 | Disposition: A | Discharge status: Home / Self Care | Payer: Medicaid Other | Attending: Emergency Medicine | Admitting: Emergency Medicine

## 2022-11-11 DIAGNOSIS — E1122 Type 2 diabetes mellitus with diabetic chronic kidney disease: Secondary | ICD-10-CM | POA: Diagnosis not present

## 2022-11-11 DIAGNOSIS — Z794 Long term (current) use of insulin: Secondary | ICD-10-CM | POA: Diagnosis not present

## 2022-11-11 DIAGNOSIS — S81809D Unspecified open wound, unspecified lower leg, subsequent encounter: Secondary | ICD-10-CM

## 2022-11-11 DIAGNOSIS — N189 Chronic kidney disease, unspecified: Secondary | ICD-10-CM | POA: Diagnosis not present

## 2022-11-11 DIAGNOSIS — Z0389 Encounter for observation for other suspected diseases and conditions ruled out: Secondary | ICD-10-CM | POA: Diagnosis not present

## 2022-11-11 DIAGNOSIS — S81802D Unspecified open wound, left lower leg, subsequent encounter: Secondary | ICD-10-CM | POA: Insufficient documentation

## 2022-11-11 DIAGNOSIS — W57XXXD Bitten or stung by nonvenomous insect and other nonvenomous arthropods, subsequent encounter: Secondary | ICD-10-CM | POA: Insufficient documentation

## 2022-11-11 LAB — CBC WITH DIFFERENTIAL/PLATELET
Abs Immature Granulocytes: 0.01 10*3/uL (ref 0.00–0.07)
Basophils Absolute: 0 10*3/uL (ref 0.0–0.1)
Basophils Relative: 0 %
Eosinophils Absolute: 0.1 10*3/uL (ref 0.0–0.5)
Eosinophils Relative: 2 %
HCT: 45.4 % (ref 39.0–52.0)
Hemoglobin: 14.7 g/dL (ref 13.0–17.0)
Immature Granulocytes: 0 %
Lymphocytes Relative: 43 %
Lymphs Abs: 1.6 10*3/uL (ref 0.7–4.0)
MCH: 29.4 pg (ref 26.0–34.0)
MCHC: 32.4 g/dL (ref 30.0–36.0)
MCV: 90.8 fL (ref 80.0–100.0)
Monocytes Absolute: 0.3 10*3/uL (ref 0.1–1.0)
Monocytes Relative: 8 %
Neutro Abs: 1.7 10*3/uL (ref 1.7–7.7)
Neutrophils Relative %: 47 %
Platelets: 247 10*3/uL (ref 150–400)
RBC: 5 MIL/uL (ref 4.22–5.81)
RDW: 13.2 % (ref 11.5–15.5)
WBC: 3.6 10*3/uL — ABNORMAL LOW (ref 4.0–10.5)
nRBC: 0 % (ref 0.0–0.2)

## 2022-11-11 LAB — COMPREHENSIVE METABOLIC PANEL
ALT: 19 U/L (ref 0–44)
AST: 20 U/L (ref 15–41)
Albumin: 4 g/dL (ref 3.5–5.0)
Alkaline Phosphatase: 77 U/L (ref 38–126)
Anion gap: 8 (ref 5–15)
BUN: 23 mg/dL — ABNORMAL HIGH (ref 6–20)
CO2: 25 mmol/L (ref 22–32)
Calcium: 9 mg/dL (ref 8.9–10.3)
Chloride: 104 mmol/L (ref 98–111)
Creatinine, Ser: 1.31 mg/dL — ABNORMAL HIGH (ref 0.61–1.24)
GFR, Estimated: >60 mL/min (ref >60)
Glucose, Bld: 153 mg/dL — ABNORMAL HIGH (ref 70–99)
Potassium: 3.7 mmol/L (ref 3.5–5.1)
Sodium: 137 mmol/L (ref 135–145)
Total Bilirubin: 0.6 mg/dL (ref 0.3–1.2)
Total Protein: 7.7 g/dL (ref 6.5–8.1)

## 2022-11-11 MED ORDER — DOXYCYCLINE HYCLATE 100 MG PO TABS
100.0000 mg | ORAL_TABLET | Freq: Once | ORAL | Status: AC
  Administered 2022-11-11: 100 mg via ORAL
  Filled 2022-11-11: qty 1

## 2022-11-11 MED ORDER — DOXYCYCLINE HYCLATE 100 MG PO CAPS: 100.0000 mg | ORAL_CAPSULE | Freq: Two times a day (BID) | ORAL | 0 refills | Status: DC

## 2022-11-11 NOTE — ED Notes (Signed)
Dc instructions and scripts reviewed with pt. Will follow up with wound care this week.

## 2022-11-11 NOTE — ED Triage Notes (Signed)
Patient states prior  to [place] he got a bug bite on his left posterior leg. Area noted to have erythema and drainage. + DM. Patient states it is a throbbing pain that has increased over time.

## 2022-11-11 NOTE — Discharge Instructions (Signed)
You have been given a referral to our wound care clinic.  Please reach out to them if you have not heard from them within the next 48 hours to arrange outpatient wound care for your legs.  Take the entire course of the antibiotics prescribed.

## 2022-11-11 NOTE — ED Provider Notes (Signed)
[facility]GENC[facility] DEPARTMENT AT [facility] Provider Note   CSN: [id number] Arrival date & time: 11/11/22  0857     History  Chief Complaint  Patient presents with   Wound Infection    [name] is a 52 y.o. male with a history including type 2 diabetes, chronic kidney disease, venous insufficiency, history of DVT and chronic venous stasis ulcer right lateral calf presenting for evaluation of a new wound on his left calf.  He states approximately a month ago he noticed a small "scratch" in his posterior right calf, thought it might have been a bug bite, over the past month it has expanded and yesterday the top layer sloughed off and now it is a ulcer.  He endorses aching and throbbing when he is standing, otherwise is fairly comfortable.  He was at work today when he noticed it drained blood through the dressing therefore presented here for further evaluation.  He has a history of necrotizing fasciitis of his right leg, was admitted to the hospital in 2018 where he underwent debridement.  He continues to have a nonhealing portion from this debridement right lateral lower leg.   He was seen for this here in December and referred to wound care but he states he was never able to get in touch with them.  He denies fevers or chills, nausea or vomiting, no other complaints.  He has been applying raw honey to the right leg with dressing changes twice weekly.  The history is provided by the patient.       Home Medications Prior to Admission medications   Medication Sig Start Date End Date Taking? Authorizing Provider  doxycycline (VIBRAMYCIN) 100 MG capsule Take 1 capsule (100 mg total) by mouth 2 (two) times daily. 11/11/22  Yes Hiedi Touchton, Raynelle Fanning, PA-C  ACCU-CHEK FASTCLIX LANCETS MISC U UTD TO CHECK BLOOD SUGAR TID 01/13/18   [provider]  ACCU-CHEK GUIDE test strip USE TO TEST QID UTD 12/13/17   [provider]  acetaminophen (TYLENOL) 325 MG tablet Take 2  tablets (650 mg total) by mouth every 6 (six) hours as needed for up to 30 doses. 09/28/21   Terald Sleeper, MD  amLODipine (NORVASC) 10 MG tablet Take 10 mg by mouth every morning.    [provider]  colchicine 0.6 MG tablet Take 1 tablet (0.6 mg total) by mouth daily. 09/28/21   Tilden Fossa, MD  glucose blood test strip Use 4 (four) times daily Use as instructed. 11/13/17   [provider]  hydrALAZINE (APRESOLINE) 100 MG tablet Take 100 mg by mouth 3 (three) times daily. 12/25/19   [provider]  hydrochlorothiazide (HYDRODIURIL) 25 MG tablet Take 25 mg by mouth every morning. 05/08/18   [provider]  ibuprofen (ADVIL) 800 MG tablet Take 1 tablet (800 mg total) by mouth every 8 (eight) hours as needed for mild pain or moderate pain. 09/01/20   Tonna Boehringer, Isami, DO  insulin aspart (NOVOLOG) 100 UNIT/ML FlexPen Inject 10-15 Units into the skin 4 (four) times daily. Sliding scale    [provider]  lisinopril (PRINIVIL,ZESTRIL) 40 MG tablet Take 40 mg by mouth every morning. 05/09/18   [provider]  metoprolol tartrate (LOPRESSOR) 100 MG tablet Take 1 tablet (100 mg total) by mouth 2 (two) times daily. 04/12/17   Kathlen Mody, MD  oxyCODONE (ROXICODONE) 5 MG immediate release tablet Take 1 tablet (5 mg total) by mouth every 6 (six) hours as needed for  up to 15 doses for severe pain. 09/28/21   Terald Sleeper, MD      Allergies    Patient has no known allergies.    Review of Systems   Review of Systems  Constitutional:  Negative for chills and fever.  HENT:  Negative for congestion and sore throat.   Eyes: Negative.   Respiratory:  Negative for chest tightness and shortness of breath.   Cardiovascular:  Negative for chest pain.  Gastrointestinal:  Negative for abdominal pain and nausea.  Genitourinary: Negative.   Musculoskeletal:  Negative for arthralgias, joint swelling and neck pain.  Skin:  Positive for wound. Negative for  rash.  Neurological:  Negative for dizziness, weakness, light-headedness, numbness and headaches.  Psychiatric/Behavioral: Negative.    All other systems reviewed and are negative.   Physical Exam Updated Vital Signs BP (!) 154/91 (BP Location: Left Arm)   Pulse 62   Temp 97.8 F (36.6 C) (Oral)   SpO2 99%  Physical Exam Vitals and nursing note reviewed.  Constitutional:      Appearance: He is well-developed.  HENT:     Head: Normocephalic and atraumatic.  Eyes:     Conjunctiva/sclera: Conjunctivae normal.  Cardiovascular:     Rate and Rhythm: Normal rate and regular rhythm.     Pulses:          Dorsalis pedis pulses are 2+ on the right side and 2+ on the left side.     Heart sounds: Normal heart sounds.  Pulmonary:     Effort: Pulmonary effort is normal.     Breath sounds: Normal breath sounds. No wheezing.  Abdominal:     General: Bowel sounds are normal.     Palpations: Abdomen is soft.     Tenderness: There is no abdominal tenderness.  Musculoskeletal:        General: Normal range of motion.     Cervical back: Normal range of motion.  Skin:    General: Skin is warm and dry.  Neurological:     Mental Status: He is alert.     First photo chronic wound right leg last documented in photo 12/23.  Second photo new wound left posterior calf      ED Results / Procedures / Treatments   Labs (all labs ordered are listed, but only abnormal results are displayed) Labs Reviewed  CBC WITH DIFFERENTIAL/PLATELET - Abnormal; Notable for the following components:      Result Value   WBC 3.6 (*)    All other components within normal limits  COMPREHENSIVE METABOLIC PANEL - Abnormal; Notable for the following components:   Glucose, Bld 153 (*)    BUN 23 (*)    Creatinine, Ser 1.31 (*)    All other components within normal limits    EKG None  Radiology DG Tibia/Fibula Left  Result Date: 11/11/2022 CLINICAL DATA:  suspected diabetic wound left posterior calf EXAM:  LEFT TIBIA AND FIBULA - 2 VIEW COMPARISON:  09/28/2021 and previous FINDINGS: Skin defect posteroinferior calf. No definite subcutaneous gas or radiodense foreign body. Scattered arterial calcifications. Regional bones unremarkable. IMPRESSION: Skin defect without subcutaneous gas or radiodense foreign body. Electronically Signed   By: Corlis Leak M.D.   On: 11/11/2022 10:43    Procedures Procedures    Medications Ordered in ED Medications  doxycycline (VIBRA-TABS) tablet 100 mg (100 mg Oral Given 11/11/22 1109)    ED Course/ Medical Decision Making/ A&P  Medical Decision Making Patient presenting with chronic lower extremity wounds, the wound on his right leg has been present for years and is not substantially changed since last documented visit here in December 2023, new wound left calf, neither wounds appear infected, although there was white exudate on the dressings, there is no surrounding cellulitis, fluctuance, wound bases have healthy appearing granulation tissue.  He does have a history of venous stasis, he does have adequate DP pulses with 2-second cap refill in his toes.  We discussed need for a wound care clinic and he is given a referral today.  He was advised that he should call them if he has not heard from the clinic within the next 24 hours to arrange follow-up care.  He has been placed on doxycycline today to cover for infection.  Dressings replaced.  Amount and/or Complexity of Data Reviewed Labs: ordered.    Details: Reviewed, normal WBC count, CBG 153. Radiology: ordered.    Details: Left tib-fib, no subcu gas.  Risk Prescription drug management.           Final Clinical Impression(s) / ED Diagnoses Final diagnoses:  Open leg wound, unspecified laterality, subsequent encounter    Rx / DC Orders ED Discharge Orders          Ordered    doxycycline (VIBRAMYCIN) 100 MG capsule  2 times daily        11/11/22 1057    Ambulatory  referral to Wound Clinic        11/11/22 1101              Burgess Amor, Cordelia Poche 11/11/22 1121    Gerhard Munch, MD 11/11/22 1520

## 2022-11-14 NOTE — Therapy (Addendum)
OUTPATIENT PHYSICAL THERAPY Wound EVALUATION   Patient Name: [name] MRN: [id number] DOB:March 02, 1971, 52 y.o., male Today's Date: 11/15/2022   PCP: Roxine Caddy REFERRING PROVIDER: Burgess Amor, PA-C  END OF SESSION:  PT End of Session - 11/15/22 1119     Visit Number 1    Number of Visits 12    Date for PT Re-Evaluation 12/27/22    Authorization Type medicaid healthy auth put in    Progress Note Due on Visit 12    PT Start Time 1030    PT Stop Time 1120    PT Time Calculation (min) 50 min    Activity Tolerance Patient tolerated treatment well    Behavior During Therapy [facility] for tasks assessed/performed             Past Medical History:  Diagnosis Date   Acute blood loss as cause of postoperative anemia 04/18/2017   ARF (acute renal failure) (HCC)    Cellulitis of right lower leg 03/31/2017   Chronic anticoagulation 03/31/2017   Diabetes mellitus without complication (HCC)    Diabetic ulcer of calf (HCC) 03/31/2017   DVT of proximal lower limb (HCC) 03/20/2017   R calf area   Escherichia coli sepsis (HCC) 04/18/2017   Gout    History of hiatal hernia    Hypertension    Hypoalbuminemia 03/31/2017   Necrotizing fasciitis (HCC)    Right leg DVT (HCC) 03/31/2017   Sepsis (HCC) 03/30/2017   Sinus tachycardia 03/31/2017   Type 2 diabetes mellitus with stage 2 chronic kidney disease, with long-term current use of insulin (HCC) 03/27/2017   Past Surgical History:  Procedure Laterality Date   COLONOSCOPY WITH PROPOFOL N/A 02/10/2018   Procedure: COLONOSCOPY WITH PROPOFOL;  Surgeon: Toney Reil, MD;  Location: [facility] ENDOSCOPY;  Service: Gastroenterology;  Laterality: N/A;   ELBOW SURGERY Right    DUE TO INFECTION   ESOPHAGOGASTRODUODENOSCOPY (EGD) WITH PROPOFOL N/A 02/10/2018   Procedure: ESOPHAGOGASTRODUODENOSCOPY (EGD) WITH PROPOFOL;  Surgeon: Toney Reil, MD;  Location: [facility] ENDOSCOPY;  Service: Gastroenterology;  Laterality: N/A;    ESOPHAGOGASTRODUODENOSCOPY (EGD) WITH PROPOFOL N/A 04/14/2020   Procedure: ESOPHAGOGASTRODUODENOSCOPY (EGD) WITH PROPOFOL;  Surgeon: Toney Reil, MD;  Location: [facility];  Service: Endoscopy;  Laterality: N/A;  diabetic - insulin   I & D EXTREMITY Right 04/03/2017   Procedure: IRRIGATION AND DEBRIDEMENT RIGHT LEG, APPLY WOUND VAC;  Surgeon: Nadara Mustard, MD;  Location: [facility] OR;  Service: Orthopedics;  Laterality: Right;   I & D EXTREMITY Right 04/05/2017   Procedure: IRRIGATION AND DEBRIDEMENT RIGHT LEG;  Surgeon: Nadara Mustard, MD;  Location[facility] OR;  Service: Orthopedics;  Laterality: Right;   IRRIGATION AND DEBRIDEMENT ABSCESS Right 12/30/2018   Procedure: MINOR INCISION AND DRAINAGE OF ABSCESS;  Surgeon: Ernest Mallick, MD;  Location: [facility] ORS;  Service: Orthopedics;  Laterality: Right;   SCROTAL SURGERY  2006   SKIN SPLIT GRAFT Right 04/10/2017   Procedure: SKIN GRAFT SPLIT THICKNESS RIGHT LEG;  Surgeon: Nadara Mustard, MD;  Location: [facility] OR;  Service: Orthopedics;  Laterality: Right;   XI ROBOTIC ASSISTED INGUINAL HERNIA REPAIR WITH MESH Bilateral 09/01/2020   Procedure: XI ROBOTIC ASSISTED INGUINAL HERNIA REPAIR WITH MESH;  Surgeon: Sung Amabile, DO;  Location: [facility] ORS;  Service: General;  Laterality: Bilateral;   Patient Active Problem List   Diagnosis Date Noted   Acute CVA (cerebrovascular accident) (HCC) 11/26/2020   CKD (chronic kidney disease) 11/26/2020   Abdominal pain, chronic,  epigastric    Arthralgia of right elbow 01/13/2019   Venous stasis ulcer (HCC) 12/31/2018   Chronic kidney disease (CKD) stage G2/A2, mildly decreased glomerular filtration rate (GFR) between 60-89 mL/min/1.73 square meter and albuminuria creatinine ratio between 30-299 mg/g 12/31/2018   Septic arthritis of elbow, right (HCC) 12/30/2018   Chronic venous insufficiency 02/06/2018   Chronic anticoagulation 03/31/2017   Type 2 diabetes mellitus with stage 2 chronic kidney disease, with  long-term current use of insulin (HCC) 03/27/2017   History of DVT (deep vein thrombosis) 03/27/2017   Essential hypertension 01/28/2008    ONSET DATE: acute wound 09/04/22; chronic 2018  REFERRING DIAG: non healing CVI wound Rt LE; non healing CVA wound Lt LE  THERAPY DIAG:  Non healing Rt LE wound Non healing Lt LE wound   Rationale for Evaluation and Treatment: Rehabilitation     Wound Therapy - 11/15/22 0001     Subjective Pt states that he has had the wound on his Rt leg since 2018, the wound on his Lt leg has been there for about two months.  He has been caring at home with compression garments, elevation and dressing but it is getting bigger therefore he went to the ER who referred him to this facility.  He was going to the wound care center in [place] but he lost his car and could not afford the [place] costs back and forth.    Patient and Family Stated Goals wounds to heal    Date of Onset --   Rt 2018; Lt March   Pain Scale 0-10    Pain Score 5     Pain Type Acute pain    Pain Location Leg    Pain Orientation Left    Pain Descriptors / Indicators Throbbing    Patients Stated Pain Goal 1    Evaluation and Treatment Procedures Explained to Patient/Family Yes    Evaluation and Treatment Procedures agreed to    Wound Properties Date First Assessed: 11/15/22 Time First Assessed: 1039 Wound Type: Diabetic ulcer Location: Leg Location Orientation: Right;Lateral Present on Admission: Yes   Wound Image Images linked: 1    Dressing Type Gauze (Comment)    Dressing Changed Changed    Dressing Status Old drainage    Dressing Change Frequency PRN    Site / Wound Assessment Painful;Dusky;Friable    % Wound base Red or Granulating 80%    % Wound base Yellow/Fibrinous Exudate 20%    Peri-wound Assessment Edema    Wound Length (cm) 6 cm    Wound Width (cm) 2.8 cm    Wound Depth (cm) 0.2 cm    Wound Volume (cm^3) 3.36 cm^3    Wound Surface Area (cm^2) 16.8 cm^2    Drainage Amount  Minimal    Treatment Cleansed;Debridement (Selective)    Wound Properties Date First Assessed: 11/15/22 Time First Assessed: 1040 Wound Type: Skin tear Location: Leg Location Orientation: Left;Proximal;Posterior Present on Admission: Yes   Wound Image Images linked: 1    Dressing Type Gauze (Comment)    Dressing Changed Changed    Dressing Status Old drainage    Dressing Change Frequency PRN    Site / Wound Assessment Granulation tissue    % Wound base Red or Granulating 90%    % Wound base Yellow/Fibrinous Exudate 10%    Peri-wound Assessment Edema    Wound Length (cm) 3 cm    Wound Width (cm) 3 cm    Wound Surface Area (cm^2) 9 cm^2  Drainage Amount Minimal    Treatment Cleansed;Other (Comment)   compression dressing   Selective Debridement (non-excisional) - Location edges of wound    Selective Debridement (non-excisional) - Tools Used Forceps;Scalpel;Scissors    Selective Debridement (non-excisional) - Tissue Removed devitalized tissue, biofilm    Wound Therapy - Clinical Statement see below    Wound Therapy - Functional Problem List dressing, bathing    Factors Delaying/Impairing Wound Healing Diabetes Mellitus;Altered sensation;Multiple medical problems;Polypharmacy;Vascular compromise    Hydrotherapy Plan Debridement;Dressing change;Patient/family education    Wound Therapy - Frequency 2X / week   x 6 weeks   Wound Therapy - Current Recommendations PT    Wound Plan cleanse, moisturize, debridement and compression dressing             PATIENT EDUCATION: Education details: Keep dressing dry, do not remove unless dressing becomes wet or painful  Person educated: Patient Education method: Explanation Education comprehension: verbalized understanding   HOME EXERCISE PROGRAM: Ankle pumps    GOALS: Goals reviewed with patient? No  SHORT TERM GOALS: Target date: 09/07/22  Pt wounds to be 100% granulated  Baseline: Goal status: INITIAL  2.  Pt wounds to have  decreased 50% in size  Baseline:  Goal status: INITIAL  LONG TERM GOALS: Target date: 12/28/22  Pt wounds to be healed to decrease risk of infection. Baseline:  Goal status: INITIAL  2.  Pt to be using compression garments Baseline:  Goal status: INITIAL  3.  Pt to have obtained and be using a compression pump. Baseline:  Goal status: INITIAL    ASSESSMENT:  CLINICAL IMPRESSION: Patient is a 52 y.o. male who was seen today for physical therapy evaluation and treatment for LE wounds on both LE. [name] is a 52 y.o. male with a history including type 2 diabetes, chronic kidney disease, venous insufficiency, history of DVT and chronic venous stasis ulcer right lateral calf presenting for evaluation of a new wound on his left calf.  He states approximately a month ago he noticed a small "scratch" in his posterior right calf, thought it might have been a bug bite, over the past month it has expanded and yesterday the top layer sloughed off and now it is a ulcer.  [name] will benefit from skilled PT for debridement and dressing changes to create a healing environment for his wounds.  The pt will benefit from a compression pump as he has been wearing compression garments  and elevating for over 8 weeks without total relief of his edema which is not allowing his wounds to heal.   OBJECTIVE IMPAIRMENTS: increased edema, pain, and decreased skin integrity  .   ACTIVITY LIMITATIONS: hygiene/grooming  PARTICIPATION LIMITATIONS: yard work  PERSONAL FACTORS: 3+ comorbidities: DM< CKD, CVI  are also affecting patient's functional outcome.   REHAB POTENTIAL: Fair    CLINICAL DECISION MAKING: Evolving/moderate complexity  EVALUATION COMPLEXITY: Moderate  PLAN: PT FREQUENCY: 2x/week  PT DURATION: 6 weeks  PLANNED INTERVENTIONS: Patient/Family education, Self Care, and debridement and dressing   PLAN FOR NEXT SESSION: Continue with debridement and dressing change.    Virgina Organ, PT CLT [phone]  11/15/2022, 11:34 AM

## 2022-11-15 ENCOUNTER — Ambulatory Visit (HOSPITAL_COMMUNITY): Payer: Medicaid Other | Attending: Internal Medicine | Admitting: Physical Therapy

## 2022-11-15 ENCOUNTER — Other Ambulatory Visit: Payer: Self-pay

## 2022-11-15 DIAGNOSIS — S81801S Unspecified open wound, right lower leg, sequela: Secondary | ICD-10-CM

## 2022-11-15 DIAGNOSIS — M79662 Pain in left lower leg: Secondary | ICD-10-CM | POA: Diagnosis not present

## 2022-11-15 DIAGNOSIS — S81802S Unspecified open wound, left lower leg, sequela: Secondary | ICD-10-CM | POA: Diagnosis not present

## 2022-11-15 DIAGNOSIS — M79661 Pain in right lower leg: Secondary | ICD-10-CM | POA: Insufficient documentation

## 2022-11-19 ENCOUNTER — Ambulatory Visit (HOSPITAL_COMMUNITY): Payer: Medicaid Other | Admitting: Physical Therapy

## 2022-11-19 DIAGNOSIS — M79661 Pain in right lower leg: Secondary | ICD-10-CM

## 2022-11-19 DIAGNOSIS — M79662 Pain in left lower leg: Secondary | ICD-10-CM | POA: Diagnosis not present

## 2022-11-19 DIAGNOSIS — S81801S Unspecified open wound, right lower leg, sequela: Secondary | ICD-10-CM | POA: Diagnosis not present

## 2022-11-19 DIAGNOSIS — S81802S Unspecified open wound, left lower leg, sequela: Secondary | ICD-10-CM

## 2022-11-19 NOTE — Therapy (Signed)
OUTPATIENT PHYSICAL THERAPY Wound Treatment   Patient Name: [name] MRN: [id number] DOB:[birth year], 52 y.o., male Today's Date: 11/19/2022   PCP: Roxine Caddy REFERRING PROVIDER: Burgess Amor, PA-C  END OF SESSION:  PT End of Session - 11/19/22 1628     Visit Number 2    Number of Visits 12    Date for PT Re-Evaluation 12/27/22    Authorization Type medicaid healthy blue approved 5 visits 5/2-6/30    Authorization - Visit Number 1    Authorization - Number of Visits 5    Progress Note Due on Visit 12    PT Start Time 1518    PT Stop Time 1600    PT Time Calculation (min) 42 min    Activity Tolerance Patient tolerated treatment well    Behavior During Therapy [facility] for tasks assessed/performed             Past Medical History:  Diagnosis Date   Acute blood loss as cause of postoperative anemia 04/18/2017   ARF (acute renal failure) (HCC)    Cellulitis of right lower leg 03/31/2017   Chronic anticoagulation 03/31/2017   Diabetes mellitus without complication (HCC)    Diabetic ulcer of calf (HCC) 03/31/2017   DVT of proximal lower limb (HCC) 03/20/2017   R calf area   Escherichia coli sepsis (HCC) 04/18/2017   Gout    History of hiatal hernia    Hypertension    Hypoalbuminemia 03/31/2017   Necrotizing fasciitis (HCC)    Right leg DVT (HCC) 03/31/2017   Sepsis (HCC) 03/30/2017   Sinus tachycardia 03/31/2017   Type 2 diabetes mellitus with stage 2 chronic kidney disease, with long-term current use of insulin (HCC) 03/27/2017   Past Surgical History:  Procedure Laterality Date   COLONOSCOPY WITH PROPOFOL N/A 02/10/2018   Procedure: COLONOSCOPY WITH PROPOFOL;  Surgeon: Toney Reil, MD;  Location: [facility] ENDOSCOPY;  Service: Gastroenterology;  Laterality: N/A;   ELBOW SURGERY Right    DUE TO INFECTION   ESOPHAGOGASTRODUODENOSCOPY (EGD) WITH PROPOFOL N/A 02/10/2018   Procedure: ESOPHAGOGASTRODUODENOSCOPY (EGD) WITH PROPOFOL;  Surgeon: Toney Reil, MD;   Location: [facility] ENDOSCOPY;  Service: Gastroenterology;  Laterality: N/A;   ESOPHAGOGASTRODUODENOSCOPY (EGD) WITH PROPOFOL N/A 04/14/2020   Procedure: ESOPHAGOGASTRODUODENOSCOPY (EGD) WITH PROPOFOL;  Surgeon: Toney Reil, MD;  Location: [facility];  Service: Endoscopy;  Laterality: N/A;  diabetic - insulin   I & D EXTREMITY Right 04/03/2017   Procedure: IRRIGATION AND DEBRIDEMENT RIGHT LEG, APPLY WOUND VAC;  Surgeon: Nadara Mustard, MD;  Location: [facility] OR;  Service: Orthopedics;  Laterality: Right;   I & D EXTREMITY Right 04/05/2017   Procedure: IRRIGATION AND DEBRIDEMENT RIGHT LEG;  Surgeon: Nadara Mustard, MD;  Location: [facility] OR;  Service: Orthopedics;  Laterality: Right;   IRRIGATION AND DEBRIDEMENT ABSCESS Right 12/30/2018   Procedure: MINOR INCISION AND DRAINAGE OF ABSCESS;  Surgeon: Ernest Mallick, MD;  Location: [facility] ORS;  Service: Orthopedics;  Laterality: Right;   SCROTAL SURGERY  2006   SKIN SPLIT GRAFT Right 04/10/2017   Procedure: SKIN GRAFT SPLIT THICKNESS RIGHT LEG;  Surgeon: Nadara Mustard, MD;  Location: [facility] OR;  Service: Orthopedics;  Laterality: Right;   XI ROBOTIC ASSISTED INGUINAL HERNIA REPAIR WITH MESH Bilateral 09/01/2020   Procedure: XI ROBOTIC ASSISTED INGUINAL HERNIA REPAIR WITH MESH;  Surgeon: Sung Amabile, DO;  Location: [facility] ORS;  Service: General;  Laterality: Bilateral;   Patient Active Problem List   Diagnosis Date Noted  Acute CVA (cerebrovascular accident) (HCC) 11/26/2020   CKD (chronic kidney disease) 11/26/2020   Abdominal pain, chronic, epigastric    Arthralgia of right elbow 01/13/2019   Venous stasis ulcer (HCC) 12/31/2018   Chronic kidney disease (CKD) stage G2/A2, mildly decreased glomerular filtration rate (GFR) between 60-89 mL/min/1.73 square meter and albuminuria creatinine ratio between 30-299 mg/g 12/31/2018   Septic arthritis of elbow, right (HCC) 12/30/2018   Chronic venous insufficiency 02/06/2018   Chronic anticoagulation  03/31/2017   Type 2 diabetes mellitus with stage 2 chronic kidney disease, with long-term current use of insulin (HCC) 03/27/2017   History of DVT (deep vein thrombosis) 03/27/2017   Essential hypertension 01/28/2008    ONSET DATE: acute wound 09/04/22; chronic 2018  REFERRING DIAG: non healing CVI wound Rt LE; non healing CVA wound Lt LE  THERAPY DIAG:  Non healing Rt LE wound Non healing Lt LE wound   Rationale for Evaluation and Treatment: Rehabilitation     Wound Therapy - 11/19/22 1630     Subjective pt states his dresssing was wanting to slide down.  comes today with compression sock over his dressing  just to hold in place.  Reports a low level compression, like 8-47mmHg    Patient and Family Stated Goals wounds to heal    Date of Onset --   Rt 2018; Lt March   Pain Scale 0-10    Pain Score 3     Pain Type Other (Comment)   pain with debridement   Pain Location Leg    Pain Orientation Left    Pain Descriptors / Indicators Sharp    Evaluation and Treatment Procedures Explained to Patient/Family Yes    Evaluation and Treatment Procedures agreed to    Wound Properties Date First Assessed: 11/15/22 Time First Assessed: 1039 Wound Type: Diabetic ulcer Location: Leg Location Orientation: Right;Lateral Present on Admission: Yes   Dressing Type Gauze (Comment)    Dressing Changed Changed    Dressing Status Old drainage    Dressing Change Frequency PRN    Site / Wound Assessment Painful;Dusky;Friable    % Wound base Red or Granulating 85%    % Wound base Yellow/Fibrinous Exudate 15%    Peri-wound Assessment Edema    Drainage Amount Minimal    Drainage Description Serosanguineous    Treatment Cleansed;Debridement (Selective)    Wound Properties Date First Assessed: 11/15/22 Time First Assessed: 1040 Wound Type: Skin tear Location: Leg Location Orientation: Left;Proximal;Posterior Present on Admission: Yes   Dressing Type Gauze (Comment)    Dressing Changed Changed    Dressing  Status Old drainage    Dressing Change Frequency PRN    Site / Wound Assessment Granulation tissue    % Wound base Red or Granulating 90%    % Wound base Yellow/Fibrinous Exudate 10%    Peri-wound Assessment Edema    Drainage Amount Minimal    Drainage Description Serosanguineous    Treatment Cleansed;Debridement (Selective)    Selective Debridement (non-excisional) - Location edges of wound    Selective Debridement (non-excisional) - Tools Used Forceps;Scalpel;Scissors    Selective Debridement (non-excisional) - Tissue Removed devitalized tissue, biofilm    Wound Therapy - Clinical Statement see below    Wound Therapy - Functional Problem List dressing, bathing    Factors Delaying/Impairing Wound Healing Diabetes Mellitus;Altered sensation;Multiple medical problems;Polypharmacy;Vascular compromise    Hydrotherapy Plan Debridement;Dressing change;Patient/family education    Wound Therapy - Frequency 2X / week   x 6 weeks   Wound Therapy - Current  Recommendations PT    Wound Plan cleanse, moisturize, debridement and compression dressing    Dressing  silver hydrofiber, 4X4, ABD, kerlix, #7 lymph netting, cotton at ankle, coban, #5 netting bil LE's            [name] is a 52 y.o. male with a history including type 2 diabetes, chronic kidney disease, venous insufficiency, history of DVT and chronic venous stasis ulcer right lateral calf presenting for evaluation of a new wound on his left calf.  He states approximately a month ago he noticed a small "scratch" in his posterior right calf, thought it might have been a bug bite, over the past month it has expanded and yesterday the top layer sloughed off and now it is a ulcer.  Noted edema in B LE.  Mr. Gorman will benefit from skilled PT for debridement and proper dressing to allow a healing environment.    PATIENT EDUCATION: Education details: Keep dressing dry, do not remove unless dressing becomes wet or painful  Person  educated: Patient Education method: Explanation Education comprehension: verbalized understanding   HOME EXERCISE PROGRAM: Ankle pumps    GOALS: Goals reviewed with patient? No  SHORT TERM GOALS: Target date: 09/07/22  Pt wounds to be 100% granulated  Baseline: Goal status: INITIAL  2.  Pt wounds to have decreased 50% in size  Baseline:  Goal status: INITIAL  LONG TERM GOALS: Target date: 12/28/22  Pt wounds to be healed to decrease risk of infection. Baseline:  Goal status: INITIAL  2.  Pt to be using compression garments Baseline:  Goal status: INITIAL  3.  Pt to have obtained and be using a compression pump. Baseline:  Goal status: INITIAL    ASSESSMENT:  CLINICAL IMPRESSION: Pt with dressings intact, appears to have no induration/edema in distal LE present with compression in place.  Less drainage present on Rt LE vs Lt.  Increased granulation following debridement in both wounds.  Cleansed wounds and LE's well with application of lotion to perimeter.  Continued with silver hydrofiber on both wounds as appears to be promoting a good healing environment.  The pt will benefit from a compression pump as he has been wearing compression garments without total relief of his edema which is not allowing his wounds to heal.  Also recommended getting higher compression stockings as has been getting over the counter at [employer].  Will need measurements to order from [facility] next session.   OBJECTIVE IMPAIRMENTS: increased edema, pain, and decreased skin integrity  .   ACTIVITY LIMITATIONS: hygiene/grooming  PARTICIPATION LIMITATIONS: yard work  PERSONAL FACTORS: 3+ comorbidities: DM< CKD, CVI  are also affecting patient's functional outcome.   REHAB POTENTIAL: Fair    CLINICAL DECISION MAKING: Evolving/moderate complexity  EVALUATION COMPLEXITY: Moderate  PLAN: PT FREQUENCY: 2x/week  PT DURATION: 6 weeks  PLANNED INTERVENTIONS: Patient/Family education, Self  Care, and debridement and dressing   PLAN FOR NEXT SESSION: Continue with debridement and dressing change. Measure LE's for compression socks and give info for [employer].   Lurena Nida, PTA/CLT [facility] Ph: [phone]  11/19/2022, 5:08 PM

## 2022-11-22 ENCOUNTER — Ambulatory Visit (HOSPITAL_COMMUNITY): Payer: Medicaid Other

## 2022-11-22 ENCOUNTER — Encounter (HOSPITAL_COMMUNITY): Payer: Self-pay

## 2022-11-22 DIAGNOSIS — Z794 Long term (current) use of insulin: Secondary | ICD-10-CM | POA: Diagnosis not present

## 2022-11-22 DIAGNOSIS — R809 Proteinuria, unspecified: Secondary | ICD-10-CM | POA: Diagnosis not present

## 2022-11-22 DIAGNOSIS — S81802S Unspecified open wound, left lower leg, sequela: Secondary | ICD-10-CM | POA: Diagnosis not present

## 2022-11-22 DIAGNOSIS — E11622 Type 2 diabetes mellitus with other skin ulcer: Secondary | ICD-10-CM | POA: Diagnosis not present

## 2022-11-22 DIAGNOSIS — S81801S Unspecified open wound, right lower leg, sequela: Secondary | ICD-10-CM | POA: Diagnosis not present

## 2022-11-22 DIAGNOSIS — M79662 Pain in left lower leg: Secondary | ICD-10-CM | POA: Diagnosis not present

## 2022-11-22 DIAGNOSIS — Z0000 Encounter for general adult medical examination without abnormal findings: Secondary | ICD-10-CM | POA: Diagnosis not present

## 2022-11-22 DIAGNOSIS — Z86718 Personal history of other venous thrombosis and embolism: Secondary | ICD-10-CM | POA: Diagnosis not present

## 2022-11-22 DIAGNOSIS — M79661 Pain in right lower leg: Secondary | ICD-10-CM

## 2022-11-22 DIAGNOSIS — E1129 Type 2 diabetes mellitus with other diabetic kidney complication: Secondary | ICD-10-CM | POA: Diagnosis not present

## 2022-11-22 DIAGNOSIS — Z125 Encounter for screening for malignant neoplasm of prostate: Secondary | ICD-10-CM | POA: Diagnosis not present

## 2022-11-22 DIAGNOSIS — Z6835 Body mass index (BMI) 35.0-35.9, adult: Secondary | ICD-10-CM | POA: Diagnosis not present

## 2022-11-22 DIAGNOSIS — E1165 Type 2 diabetes mellitus with hyperglycemia: Secondary | ICD-10-CM | POA: Diagnosis not present

## 2022-11-22 DIAGNOSIS — Z1211 Encounter for screening for malignant neoplasm of colon: Secondary | ICD-10-CM | POA: Diagnosis not present

## 2022-11-22 DIAGNOSIS — I152 Hypertension secondary to endocrine disorders: Secondary | ICD-10-CM | POA: Diagnosis not present

## 2022-11-22 DIAGNOSIS — E1159 Type 2 diabetes mellitus with other circulatory complications: Secondary | ICD-10-CM | POA: Diagnosis not present

## 2022-11-22 DIAGNOSIS — L97909 Non-pressure chronic ulcer of unspecified part of unspecified lower leg with unspecified severity: Secondary | ICD-10-CM | POA: Diagnosis not present

## 2022-11-22 NOTE — Therapy (Signed)
OUTPATIENT PHYSICAL THERAPY Wound Treatment   Patient Name: KYRUS SPRIGG MRN: [id number] DOB:31-May-1971, 52 y.o., male Today's Date: 11/22/2022   PCP: Roxine Caddy REFERRING PROVIDER: Burgess Amor, PA-C  END OF SESSION:  PT End of Session - 11/22/22 1612     Visit Number 3    Number of Visits 12    Date for PT Re-Evaluation 12/27/22    Authorization Type medicaid healthy blue approved 5 visits 5/2-6/30    Authorization - Visit Number 3    Authorization - Number of Visits 5    Progress Note Due on Visit 12    PT Start Time 1502    PT Stop Time 1600    PT Time Calculation (min) 58 min    Activity Tolerance Patient tolerated treatment well    Behavior During Therapy [facility] for tasks assessed/performed              Past Medical History:  Diagnosis Date   Acute blood loss as cause of postoperative anemia 04/18/2017   ARF (acute renal failure) (HCC)    Cellulitis of right lower leg 03/31/2017   Chronic anticoagulation 03/31/2017   Diabetes mellitus without complication (HCC)    Diabetic ulcer of calf (HCC) 03/31/2017   DVT of proximal lower limb (HCC) 03/20/2017   R calf area   Escherichia coli sepsis (HCC) 04/18/2017   Gout    History of hiatal hernia    Hypertension    Hypoalbuminemia 03/31/2017   Necrotizing fasciitis (HCC)    Right leg DVT (HCC) 03/31/2017   Sepsis (HCC) 03/30/2017   Sinus tachycardia 03/31/2017   Type 2 diabetes mellitus with stage 2 chronic kidney disease, with long-term current use of insulin (HCC) 03/27/2017   Past Surgical History:  Procedure Laterality Date   COLONOSCOPY WITH PROPOFOL N/A 02/10/2018   Procedure: COLONOSCOPY WITH PROPOFOL;  Surgeon: Toney Reil, MD;  Location: [facility] ENDOSCOPY;  Service: Gastroenterology;  Laterality: N/A;   ELBOW SURGERY Right    DUE TO INFECTION   ESOPHAGOGASTRODUODENOSCOPY (EGD) WITH PROPOFOL N/A 02/10/2018   Procedure: ESOPHAGOGASTRODUODENOSCOPY (EGD) WITH PROPOFOL;  Surgeon: Toney Reil, MD;   Location: [facility] ENDOSCOPY;  Service: Gastroenterology;  Laterality: N/A;   ESOPHAGOGASTRODUODENOSCOPY (EGD) WITH PROPOFOL N/A 04/14/2020   Procedure: ESOPHAGOGASTRODUODENOSCOPY (EGD) WITH PROPOFOL;  Surgeon: Toney Reil, MD;  Location: [facility];  Service: Endoscopy;  Laterality: N/A;  diabetic - insulin   I & D EXTREMITY Right 04/03/2017   Procedure: IRRIGATION AND DEBRIDEMENT RIGHT LEG, APPLY WOUND VAC;  Surgeon: Nadara Mustard, MD;  Location: [facility] OR;  Service: Orthopedics;  Laterality: Right;   I & D EXTREMITY Right 04/05/2017   Procedure: IRRIGATION AND DEBRIDEMENT RIGHT LEG;  Surgeon: Nadara Mustard, MD;  Location: [facility] OR;  Service: Orthopedics;  Laterality: Right;   IRRIGATION AND DEBRIDEMENT ABSCESS Right 12/30/2018   Procedure: MINOR INCISION AND DRAINAGE OF ABSCESS;  Surgeon: Ernest Mallick, MD;  Location: [facility] ORS;  Service: Orthopedics;  Laterality: Right;   SCROTAL SURGERY  2006   SKIN SPLIT GRAFT Right 04/10/2017   Procedure: SKIN GRAFT SPLIT THICKNESS RIGHT LEG;  Surgeon: Nadara Mustard, MD;  Location: [facility] OR;  Service: Orthopedics;  Laterality: Right;   XI ROBOTIC ASSISTED INGUINAL HERNIA REPAIR WITH MESH Bilateral 09/01/2020   Procedure: XI ROBOTIC ASSISTED INGUINAL HERNIA REPAIR WITH MESH;  Surgeon: Sung Amabile, DO;  Location: [facility] ORS;  Service: General;  Laterality: Bilateral;   Patient Active Problem List   Diagnosis Date Noted  Acute CVA (cerebrovascular accident) (HCC) 11/26/2020   CKD (chronic kidney disease) 11/26/2020   Abdominal pain, chronic, epigastric    Arthralgia of right elbow 01/13/2019   Venous stasis ulcer (HCC) 12/31/2018   Chronic kidney disease (CKD) stage G2/A2, mildly decreased glomerular filtration rate (GFR) between 60-89 mL/min/1.73 square meter and albuminuria creatinine ratio between 30-299 mg/g 12/31/2018   Septic arthritis of elbow, right (HCC) 12/30/2018   Chronic venous insufficiency 02/06/2018   Chronic anticoagulation  03/31/2017   Type 2 diabetes mellitus with stage 2 chronic kidney disease, with long-term current use of insulin (HCC) 03/27/2017   History of DVT (deep vein thrombosis) 03/27/2017   Essential hypertension 01/28/2008    ONSET DATE: acute wound 09/04/22; chronic 2018  REFERRING DIAG: non healing CVI wound Rt LE; non healing CVA wound Lt LE  THERAPY DIAG:  Non healing Rt LE wound Non healing Lt LE wound   Rationale for Evaluation and Treatment: Rehabilitation     Wound Therapy - 11/22/22 0001     Subjective Pt arrived with dressings intact and wearing compression garments over.  No reports of pain outside of a little stinging    Patient and Family Stated Goals wounds to heal    Date of Onset --   Right 2018; Left March   Pain Scale 0-10    Pain Score 0-No pain    Evaluation and Treatment Procedures Explained to Patient/Family Yes    Evaluation and Treatment Procedures agreed to    Wound Properties Date First Assessed: 11/15/22 Time First Assessed: 1039 Wound Type: Diabetic ulcer Location: Leg Location Orientation: Right;Lateral Present on Admission: Yes   Wound Image Images linked: 1    Dressing Type Silver hydrofiber;Compression wrap;Abdominal pads    Dressing Changed Changed    Dressing Status Old drainage    Dressing Change Frequency PRN    Site / Wound Assessment Painful;Dusky;Friable    % Wound base Red or Granulating 85%    % Wound base Yellow/Fibrinous Exudate 15%    Peri-wound Assessment Edema    Wound Length (cm) 5.8 cm    Wound Width (cm) 2.5 cm    Wound Depth (cm) 0.2 cm    Wound Volume (cm^3) 2.9 cm^3    Wound Surface Area (cm^2) 14.5 cm^2    Drainage Amount Minimal    Drainage Description Serosanguineous    Treatment Cleansed;Debridement (Selective)    Wound Properties Date First Assessed: 11/15/22 Time First Assessed: 1040 Wound Type: Skin tear Location: Leg Location Orientation: Left;Proximal;Posterior Present on Admission: Yes   Wound Image Images linked: 1     Dressing Type Impregnated gauze (bismuth);Alginate;Abdominal pads;Compression wrap    Dressing Changed Changed    Dressing Status Old drainage    Dressing Change Frequency PRN    Site / Wound Assessment Granulation tissue    % Wound base Red or Granulating 95%    % Wound base Yellow/Fibrinous Exudate 5%    Peri-wound Assessment Edema    Wound Length (cm) 2.8 cm    Wound Width (cm) 3 cm    Wound Surface Area (cm^2) 8.4 cm^2    Drainage Amount Minimal    Drainage Description Serosanguineous    Treatment Cleansed;Debridement (Selective)    Selective Debridement (non-excisional) - Location edges of wound    Selective Debridement (non-excisional) - Tools Used Forceps;Scalpel;Scissors    Selective Debridement (non-excisional) - Tissue Removed devitalized tissue, biofilm    Wound Therapy - Clinical Statement see below    Wound Therapy - Functional Problem List  dressing, bathing    Factors Delaying/Impairing Wound Healing Diabetes Mellitus;Altered sensation;Multiple medical problems;Polypharmacy;Vascular compromise    Hydrotherapy Plan Debridement;Dressing change;Patient/family education    Wound Therapy - Frequency 2X / week   x 6 weeks   Wound Therapy - Current Recommendations PT    Wound Plan cleanse, moisturize, debridement and compression dressing    Dressing  Rt: silver hydrofiber, 4X4, ABD, profore wrap; #5 netting bil LE's    Dressing Lt: xeroform, alginate, ABD, profore, netting #5             Josedejesus K Jore is a 52 y.o. male with a history including type 2 diabetes, chronic kidney disease, venous insufficiency, history of DVT and chronic venous stasis ulcer right lateral calf presenting for evaluation of a new wound on his left calf.  He states approximately a month ago he noticed a small "scratch" in his posterior right calf, thought it might have been a bug bite, over the past month it has expanded and yesterday the top layer sloughed off and now it is a ulcer.  Noted  edema in B LE.  Mr. Orosco will benefit from skilled PT for debridement and proper dressing to allow a healing environment.    PATIENT EDUCATION: Education details: Keep dressing dry, do not remove unless dressing becomes wet or painful  Person educated: Patient Education method: Explanation Education comprehension: verbalized understanding   HOME EXERCISE PROGRAM: Ankle pumps    GOALS: Goals reviewed with patient? No  SHORT TERM GOALS: Target date: 09/07/22  Pt wounds to be 100% granulated  Baseline: Goal status: INITIAL  2.  Pt wounds to have decreased 50% in size  Baseline:  Goal status: INITIAL  LONG TERM GOALS: Target date: 12/28/22  Pt wounds to be healed to decrease risk of infection. Baseline:  Goal status: INITIAL  2.  Pt to be using compression garments Baseline:  Goal status: INITIAL  3.  Pt to have obtained and be using a compression pump. Baseline:  Goal status: INITIAL    ASSESSMENT:  CLINICAL IMPRESSION: Pt arrived wearing compression garments over coban wrap, pt unsure how long he has had these compression garments. Educated need to replace compression garments every 6 months, measurements taken and given printout for [facility] to purchase new compression garments.  No induration/edema present in distal extremities.  Selective debridement for removal of slough/biofilm from wound bed and dry skin perimeter to promoted healing.  Pt c/o pain when removing dressings from wound bed Lt LE, noted dry wound bed.  Changed Lt LE to xeroform with alginate and ABD pad to address any drainage.  Added profore for edema control with reports of comfort at EOS.    OBJECTIVE IMPAIRMENTS: increased edema, pain, and decreased skin integrity  .   ACTIVITY LIMITATIONS: hygiene/grooming  PARTICIPATION LIMITATIONS: yard work  PERSONAL FACTORS: 3+ comorbidities: DM< CKD, CVI  are also affecting patient's functional outcome.   REHAB POTENTIAL: Fair    CLINICAL DECISION  MAKING: Evolving/moderate complexity  EVALUATION COMPLEXITY: Moderate  PLAN: PT FREQUENCY: 2x/week  PT DURATION: 6 weeks  PLANNED INTERVENTIONS: Patient/Family education, Self Care, and debridement and dressing   PLAN FOR NEXT SESSION: Continue with debridement and dressing change. F/U with [employer] purchase.   Becky Sax, LPTA/CLT; [facility] [phone]  11/22/2022, 4:14 PM

## 2022-11-27 ENCOUNTER — Ambulatory Visit (HOSPITAL_COMMUNITY): Payer: Medicaid Other | Admitting: Physical Therapy

## 2022-11-27 DIAGNOSIS — M79662 Pain in left lower leg: Secondary | ICD-10-CM

## 2022-11-27 DIAGNOSIS — M79661 Pain in right lower leg: Secondary | ICD-10-CM | POA: Diagnosis not present

## 2022-11-27 DIAGNOSIS — S81802S Unspecified open wound, left lower leg, sequela: Secondary | ICD-10-CM | POA: Diagnosis not present

## 2022-11-27 DIAGNOSIS — S81801S Unspecified open wound, right lower leg, sequela: Secondary | ICD-10-CM | POA: Diagnosis not present

## 2022-11-27 NOTE — Therapy (Signed)
OUTPATIENT PHYSICAL THERAPY Wound Treatment   Patient Name: SEKOU ATTEBERY MRN: [id number] DOB:1970-09-04, 52 y.o., male Today's Date: 11/27/2022   PCP: Roxine Caddy REFERRING PROVIDER: Burgess Amor, PA-C  END OF SESSION:  PT End of Session - 11/27/22 1039     Visit Number 4    Number of Visits 12    Date for PT Re-Evaluation 12/27/22    Authorization Type medicaid healthy blue approved 5 visits 5/2-6/30    Authorization - Visit Number 3    Authorization - Number of Visits 5    Progress Note Due on Visit 12    PT Start Time 1000    PT Stop Time 1030    PT Time Calculation (min) 30 min    Activity Tolerance Patient tolerated treatment well    Behavior During Therapy [facility] for tasks assessed/performed              Past Medical History:  Diagnosis Date   Acute blood loss as cause of postoperative anemia 04/18/2017   ARF (acute renal failure) (HCC)    Cellulitis of right lower leg 03/31/2017   Chronic anticoagulation 03/31/2017   Diabetes mellitus without complication (HCC)    Diabetic ulcer of calf (HCC) 03/31/2017   DVT of proximal lower limb (HCC) 03/20/2017   R calf area   Escherichia coli sepsis (HCC) 04/18/2017   Gout    History of hiatal hernia    Hypertension    Hypoalbuminemia 03/31/2017   Necrotizing fasciitis (HCC)    Right leg DVT (HCC) 03/31/2017   Sepsis (HCC) 03/30/2017   Sinus tachycardia 03/31/2017   Type 2 diabetes mellitus with stage 2 chronic kidney disease, with long-term current use of insulin (HCC) 03/27/2017   Past Surgical History:  Procedure Laterality Date   COLONOSCOPY WITH PROPOFOL N/A 02/10/2018   Procedure: COLONOSCOPY WITH PROPOFOL;  Surgeon: Toney Reil, MD;  Location: [facility] ENDOSCOPY;  Service: Gastroenterology;  Laterality: N/A;   ELBOW SURGERY Right    DUE TO INFECTION   ESOPHAGOGASTRODUODENOSCOPY (EGD) WITH PROPOFOL N/A 02/10/2018   Procedure: ESOPHAGOGASTRODUODENOSCOPY (EGD) WITH PROPOFOL;  Surgeon: Toney Reil, MD;   Location: [facility] ENDOSCOPY;  Service: Gastroenterology;  Laterality: N/A;   ESOPHAGOGASTRODUODENOSCOPY (EGD) WITH PROPOFOL N/A 04/14/2020   Procedure: ESOPHAGOGASTRODUODENOSCOPY (EGD) WITH PROPOFOL;  Surgeon: Toney Reil, MD;  Location: [facility];  Service: Endoscopy;  Laterality: N/A;  diabetic - insulin   I & D EXTREMITY Right 04/03/2017   Procedure: IRRIGATION AND DEBRIDEMENT RIGHT LEG, APPLY WOUND VAC;  Surgeon: Nadara Mustard, MD;  Location: [facility] OR;  Service: Orthopedics;  Laterality: Right;   I & D EXTREMITY Right 04/05/2017   Procedure: IRRIGATION AND DEBRIDEMENT RIGHT LEG;  Surgeon: Nadara Mustard, MD;  Location: [facility] OR;  Service: Orthopedics;  Laterality: Right;   IRRIGATION AND DEBRIDEMENT ABSCESS Right 12/30/2018   Procedure: MINOR INCISION AND DRAINAGE OF ABSCESS;  Surgeon: Ernest Mallick, MD;  Location: [facility] ORS;  Service: Orthopedics;  Laterality: Right;   SCROTAL SURGERY  2006   SKIN SPLIT GRAFT Right 04/10/2017   Procedure: SKIN GRAFT SPLIT THICKNESS RIGHT LEG;  Surgeon: Nadara Mustard, MD;  Location: [facility] OR;  Service: Orthopedics;  Laterality: Right;   XI ROBOTIC ASSISTED INGUINAL HERNIA REPAIR WITH MESH Bilateral 09/01/2020   Procedure: XI ROBOTIC ASSISTED INGUINAL HERNIA REPAIR WITH MESH;  Surgeon: Sung Amabile, DO;  Location: [facility] ORS;  Service: General;  Laterality: Bilateral;   Patient Active Problem List   Diagnosis Date Noted  Acute CVA (cerebrovascular accident) (HCC) 11/26/2020   CKD (chronic kidney disease) 11/26/2020   Abdominal pain, chronic, epigastric    Arthralgia of right elbow 01/13/2019   Venous stasis ulcer (HCC) 12/31/2018   Chronic kidney disease (CKD) stage G2/A2, mildly decreased glomerular filtration rate (GFR) between 60-89 mL/min/1.73 square meter and albuminuria creatinine ratio between 30-299 mg/g 12/31/2018   Septic arthritis of elbow, right (HCC) 12/30/2018   Chronic venous insufficiency 02/06/2018   Chronic anticoagulation  03/31/2017   Type 2 diabetes mellitus with stage 2 chronic kidney disease, with long-term current use of insulin (HCC) 03/27/2017   History of DVT (deep vein thrombosis) 03/27/2017   Essential hypertension 01/28/2008    ONSET DATE: acute wound 09/04/22; chronic 2018  REFERRING DIAG: non healing CVI wound Rt LE; non healing CVA wound Lt LE  THERAPY DIAG:  Non healing Rt LE wound Non healing Lt LE wound   Rationale for Evaluation and Treatment: Rehabilitation     Wound Therapy - 11/27/22 0001     Subjective Pt arrived with dressings intact and wearing compression garments over.  No reports of pain outside of a little stinging    Patient and Family Stated Goals wounds to heal    Date of Onset --   Right 2018; Left March   Pain Scale 0-10    Pain Score 0-No pain    Evaluation and Treatment Procedures Explained to Patient/Family Yes    Evaluation and Treatment Procedures agreed to    Wound Properties Date First Assessed: 11/15/22 Time First Assessed: 1039 Wound Type: Diabetic ulcer Location: Leg Location Orientation: Right;Lateral Present on Admission: Yes   Dressing Type Silver hydrofiber;Compression wrap;Abdominal pads    Dressing Status Old drainage    Dressing Change Frequency PRN    Site / Wound Assessment Painful;Dusky;Friable    % Wound base Red or Granulating 90%    % Wound base Yellow/Fibrinous Exudate 10%    Peri-wound Assessment Edema    Drainage Amount Moderate    Drainage Description Serosanguineous    Treatment Cleansed;Debridement (Selective)    Wound Properties Date First Assessed: 11/15/22 Time First Assessed: 1040 Wound Type: Skin tear Location: Leg Location Orientation: Left;Proximal;Posterior Present on Admission: Yes   Dressing Type Impregnated gauze (bismuth);Alginate;Abdominal pads;Compression wrap    Dressing Changed Changed    Dressing Status Old drainage    Dressing Change Frequency PRN    Site / Wound Assessment Granulation tissue    % Wound base Red or  Granulating 100%    % Wound base Yellow/Fibrinous Exudate 0%    Peri-wound Assessment Edema    Drainage Amount Minimal    Drainage Description Serosanguineous    Treatment Cleansed    Selective Debridement (non-excisional) - Location edges of wound    Selective Debridement (non-excisional) - Tools Used Forceps;Scalpel;Scissors    Selective Debridement (non-excisional) - Tissue Removed devitalized tissue, biofilm    Wound Therapy - Clinical Statement see below    Wound Therapy - Functional Problem List dressing, bathing    Factors Delaying/Impairing Wound Healing Diabetes Mellitus;Altered sensation;Multiple medical problems;Polypharmacy;Vascular compromise    Hydrotherapy Plan Debridement;Dressing change;Patient/family education    Wound Therapy - Frequency 2X / week   x 6 weeks   Wound Therapy - Current Recommendations PT    Wound Plan cleanse, moisturize, debridement and compression dressing    Dressing  Rt: silver hydrofiber, 4X4, ABD, profore wrap; #5 netting bil LE's    Dressing Lt: xeroform, profore, netting #5  Numa KAISHAWN RIQUELME is a 52 y.o. male with a history including type 2 diabetes, chronic kidney disease, venous insufficiency, history of DVT and chronic venous stasis ulcer right lateral calf presenting for evaluation of a new wound on his left calf.  He states approximately a month ago he noticed a small "scratch" in his posterior right calf, thought it might have been a bug bite, over the past month it has expanded and yesterday the top layer sloughed off and now it is a ulcer.  Noted edema in B LE.  Mr. Cota will benefit from skilled PT for debridement and proper dressing to allow a healing environment.    PATIENT EDUCATION: Education details: Keep dressing dry, do not remove unless dressing becomes wet or painful  Person educated: Patient Education method: Explanation Education comprehension: verbalized understanding   HOME EXERCISE PROGRAM: Ankle  pumps    GOALS: Goals reviewed with patient? No  SHORT TERM GOALS: Target date: 09/07/22  Pt wounds to be 100% granulated  Baseline: Goal status: IN PROGRESS  2.  Pt wounds to have decreased 50% in size  Baseline:  Goal status: IN PROGRESS  LONG TERM GOALS: Target date: 12/28/22  Pt wounds to be healed to decrease risk of infection. Baseline:  Goal status: IN PROGRESS  2.  Pt to be using compression garments Baseline:  Goal status: IN PROGRESS  3.  Pt to have obtained and be using a compression pump. Baseline:  Goal status: IN PROGRESS    ASSESSMENT:  CLINICAL IMPRESSION:   Selective debridement for removal of slough/biofilm from wound bed and dry skin perimeter to promoted healing.  LE are both very dry therapist applied both lotion and vaseline to LE prior to dressing change. Pt will need to be reassessed next visit to put in for more visits for his insurance please measure wounds.  OBJECTIVE IMPAIRMENTS: increased edema, pain, and decreased skin integrity  .   ACTIVITY LIMITATIONS: hygiene/grooming  PARTICIPATION LIMITATIONS: yard work  PERSONAL FACTORS: 3+ comorbidities: DM< CKD, CVI  are also affecting patient's functional outcome.   REHAB POTENTIAL: Fair    CLINICAL DECISION MAKING: Evolving/moderate complexity  EVALUATION COMPLEXITY: Moderate  PLAN: PT FREQUENCY: 2x/week  PT DURATION: 6 weeks  PLANNED INTERVENTIONS: Patient/Family education, Self Care, and debridement and dressing   PLAN FOR NEXT SESSION: Continue with debridement and dressing change. F/U with [employer] purchase.  Virgina Organ, PT CLT [phone]  5/14/224 1039

## 2022-11-29 DIAGNOSIS — N182 Chronic kidney disease, stage 2 (mild): Secondary | ICD-10-CM | POA: Diagnosis not present

## 2022-11-29 DIAGNOSIS — E11622 Type 2 diabetes mellitus with other skin ulcer: Secondary | ICD-10-CM | POA: Diagnosis not present

## 2022-11-29 DIAGNOSIS — Z6838 Body mass index (BMI) 38.0-38.9, adult: Secondary | ICD-10-CM | POA: Diagnosis not present

## 2022-11-29 DIAGNOSIS — Z23 Encounter for immunization: Secondary | ICD-10-CM | POA: Diagnosis not present

## 2022-11-29 DIAGNOSIS — L97909 Non-pressure chronic ulcer of unspecified part of unspecified lower leg with unspecified severity: Secondary | ICD-10-CM | POA: Diagnosis not present

## 2022-11-29 DIAGNOSIS — E1129 Type 2 diabetes mellitus with other diabetic kidney complication: Secondary | ICD-10-CM | POA: Diagnosis not present

## 2022-11-29 DIAGNOSIS — Z794 Long term (current) use of insulin: Secondary | ICD-10-CM | POA: Diagnosis not present

## 2022-11-29 DIAGNOSIS — E1165 Type 2 diabetes mellitus with hyperglycemia: Secondary | ICD-10-CM | POA: Diagnosis not present

## 2022-11-29 DIAGNOSIS — R809 Proteinuria, unspecified: Secondary | ICD-10-CM | POA: Diagnosis not present

## 2022-11-29 DIAGNOSIS — E1159 Type 2 diabetes mellitus with other circulatory complications: Secondary | ICD-10-CM | POA: Diagnosis not present

## 2022-11-29 DIAGNOSIS — I152 Hypertension secondary to endocrine disorders: Secondary | ICD-10-CM | POA: Diagnosis not present

## 2022-11-30 ENCOUNTER — Ambulatory Visit (HOSPITAL_COMMUNITY): Payer: Medicaid Other | Attending: Internal Medicine

## 2022-11-30 ENCOUNTER — Encounter (HOSPITAL_COMMUNITY): Payer: Self-pay

## 2022-11-30 DIAGNOSIS — M79662 Pain in left lower leg: Secondary | ICD-10-CM | POA: Diagnosis not present

## 2022-11-30 DIAGNOSIS — S81801S Unspecified open wound, right lower leg, sequela: Secondary | ICD-10-CM | POA: Insufficient documentation

## 2022-11-30 DIAGNOSIS — M79661 Pain in right lower leg: Secondary | ICD-10-CM | POA: Insufficient documentation

## 2022-11-30 DIAGNOSIS — S81802S Unspecified open wound, left lower leg, sequela: Secondary | ICD-10-CM | POA: Diagnosis not present

## 2022-11-30 NOTE — Therapy (Signed)
OUTPATIENT PHYSICAL THERAPY Wound Treatment   Patient Name: [name] MRN: [id number] DOB:[birth year], 52 y.o., male Today's Date: 11/30/2022   PCP: Roxine Caddy REFERRING PROVIDER: Burgess Amor, PA-C  END OF SESSION:  PT End of Session - 11/30/22 1016     Visit Number 5    Number of Visits 12    Date for PT Re-Evaluation 12/27/22    Authorization Type medicaid healthy blue approved 5 visits 5/2-6/30    Authorization - Visit Number 4    Authorization - Number of Visits 5    Progress Note Due on Visit 12    PT Start Time 0735    PT Stop Time 0820    PT Time Calculation (min) 45 min    Activity Tolerance Patient tolerated treatment well    Behavior During Therapy [facility] for tasks assessed/performed              Past Medical History:  Diagnosis Date   Acute blood loss as cause of postoperative anemia 04/18/2017   ARF (acute renal failure) (HCC)    Cellulitis of right lower leg 03/31/2017   Chronic anticoagulation 03/31/2017   Diabetes mellitus without complication (HCC)    Diabetic ulcer of calf (HCC) 03/31/2017   DVT of proximal lower limb (HCC) 03/20/2017   R calf area   Escherichia coli sepsis (HCC) 04/18/2017   Gout    History of hiatal hernia    Hypertension    Hypoalbuminemia 03/31/2017   Necrotizing fasciitis (HCC)    Right leg DVT (HCC) 03/31/2017   Sepsis (HCC) 03/30/2017   Sinus tachycardia 03/31/2017   Type 2 diabetes mellitus with stage 2 chronic kidney disease, with long-term current use of insulin (HCC) 03/27/2017   Past Surgical History:  Procedure Laterality Date   COLONOSCOPY WITH PROPOFOL N/A 02/10/2018   Procedure: COLONOSCOPY WITH PROPOFOL;  Surgeon: Toney Reil, MD;  Location: [facility] ENDOSCOPY;  Service: Gastroenterology;  Laterality: N/A;   ELBOW SURGERY Right    DUE TO INFECTION   ESOPHAGOGASTRODUODENOSCOPY (EGD) WITH PROPOFOL N/A 02/10/2018   Procedure: ESOPHAGOGASTRODUODENOSCOPY (EGD) WITH PROPOFOL;  Surgeon: Toney Reil, MD;   Location: [facility] ENDOSCOPY;  Service: Gastroenterology;  Laterality: N/A;   ESOPHAGOGASTRODUODENOSCOPY (EGD) WITH PROPOFOL N/A 04/14/2020   Procedure: ESOPHAGOGASTRODUODENOSCOPY (EGD) WITH PROPOFOL;  Surgeon: Toney Reil, MD;  Location: [facility]ERY [facility];  Service: Endoscopy;  Laterality: N/A;  diabetic - insulin   I & D EXTREMITY Right 04/03/2017   Procedure: IRRIGATION AND DEBRIDEMENT RIGHT LEG, APPLY WOUND VAC;  Surgeon: Nadara Mustard, MD;  Location: [facility] OR;  Service: Orthopedics;  Laterality: Right;   I & D EXTREMITY Right 04/05/2017   Procedure: IRRIGATION AND DEBRIDEMENT RIGHT LEG;  Surgeon: Nadara Mustard, MD;  Location: [facility] OR;  Service: Orthopedics;  Laterality: Right;   IRRIGATION AND DEBRIDEMENT ABSCESS Right 12/30/2018   Procedure: MINOR INCISION AND DRAINAGE OF ABSCESS;  Surgeon: Ernest Mallick, MD;  Location: [facility] ORS;  Service: Orthopedics;  Laterality: Right;   SCROTAL SURGERY  2006   SKIN SPLIT GRAFT Right 04/10/2017   Procedure: SKIN GRAFT SPLIT THICKNESS RIGHT LEG;  Surgeon: Nadara Mustard, MD;  Location: [facility] OR;  Service: Orthopedics;  Laterality: Right;   XI ROBOTIC ASSISTED INGUINAL HERNIA REPAIR WITH MESH Bilateral 09/01/2020   Procedure: XI ROBOTIC ASSISTED INGUINAL HERNIA REPAIR WITH MESH;  Surgeon: Sung Amabile, DO;  Location: [facility] ORS;  Service: General;  Laterality: Bilateral;   Patient Active Problem List   Diagnosis Date Noted  Acute CVA (cerebrovascular accident) (HCC) 11/26/2020   CKD (chronic kidney disease) 11/26/2020   Abdominal pain, chronic, epigastric    Arthralgia of right elbow 01/13/2019   Venous stasis ulcer (HCC) 12/31/2018   Chronic kidney disease (CKD) stage G2/A2, mildly decreased glomerular filtration rate (GFR) between 60-89 mL/min/1.73 square meter and albuminuria creatinine ratio between 30-299 mg/g 12/31/2018   Septic arthritis of elbow, right (HCC) 12/30/2018   Chronic venous insufficiency 02/06/2018   Chronic anticoagulation  03/31/2017   Type 2 diabetes mellitus with stage 2 chronic kidney disease, with long-term current use of insulin (HCC) 03/27/2017   History of DVT (deep vein thrombosis) 03/27/2017   Essential hypertension 01/28/2008    ONSET DATE: acute wound 09/04/22; chronic 2018  REFERRING DIAG: non healing CVI wound Rt LE; non healing CVA wound Lt LE  THERAPY DIAG:  Non healing Rt LE wound Non healing Lt LE wound   Rationale for Evaluation and Treatment: Rehabilitation     Wound Therapy - 11/30/22 0001     Subjective Pt arrived with dressings intact.  Stated he forgot to order new compression garments, plans to complete today.    Patient and Family Stated Goals wounds to heal    Date of Onset --   Right 2018; Left March   Pain Scale 0-10    Pain Score 0-No pain    Evaluation and Treatment Procedures Explained to Patient/Family Yes    Evaluation and Treatment Procedures agreed to    Wound Properties Date First Assessed: 11/15/22 Time First Assessed: 1039 Wound Type: Diabetic ulcer Location: Leg Location Orientation: Right;Lateral Present on Admission: Yes   Wound Image Images linked: 1    Dressing Type Silver hydrofiber;Compression wrap;Abdominal pads    Dressing Status Old drainage    Dressing Change Frequency PRN    Site / Wound Assessment Painful;Dusky;Friable    % Wound base Red or Granulating 90%    % Wound base Yellow/Fibrinous Exudate 10%    Peri-wound Assessment Edema    Wound Length (cm) 5.8 cm   was 5.8   Wound Width (cm) 2.5 cm   was 2.5   Wound Depth (cm) 0.2 cm   was .2   Wound Volume (cm^3) 2.9 cm^3    Wound Surface Area (cm^2) 14.5 cm^2    Drainage Amount Moderate    Drainage Description Serosanguineous    Treatment Cleansed;Debridement (Selective)    Wound Properties Date First Assessed: 11/15/22 Time First Assessed: 1040 Wound Type: Skin tear Location: Leg Location Orientation: Left;Proximal;Posterior Present on Admission: Yes   Wound Image Images linked: 1    Dressing  Type Impregnated gauze (bismuth);Alginate;Abdominal pads;Compression wrap    Dressing Changed Changed    Dressing Status Old drainage    Dressing Change Frequency PRN    Site / Wound Assessment Granulation tissue    % Wound base Red or Granulating 100%    % Wound base Yellow/Fibrinous Exudate 0%    Peri-wound Assessment Edema    Wound Length (cm) 2 cm   was 2.8   Wound Width (cm) 2.2 cm   was 3   Wound Surface Area (cm^2) 4.4 cm^2    Drainage Amount Minimal    Drainage Description Serosanguineous    Treatment Cleansed    Selective Debridement (non-excisional) - Location edges of wound    Selective Debridement (non-excisional) - Tools Used Forceps;Scalpel;Scissors    Selective Debridement (non-excisional) - Tissue Removed devitalized tissue, biofilm    Wound Therapy - Clinical Statement see below  Wound Therapy - Functional Problem List dressing, bathing    Factors Delaying/Impairing Wound Healing Diabetes Mellitus;Altered sensation;Multiple medical problems;Polypharmacy;Vascular compromise    Hydrotherapy Plan Debridement;Dressing change;Patient/family education    Wound Therapy - Frequency 2X / week    Wound Therapy - Current Recommendations PT    Wound Plan cleanse, moisturize, debridement and compression dressing    Dressing  Rt: silver hydrofiber, 4X4, ABD, profore wrap; #5 netting bil LE's    Dressing Lt: xeroform, profore, netting #5             Braydan K Longs is a 52 y.o. male with a history including type 2 diabetes, chronic kidney disease, venous insufficiency, history of DVT and chronic venous stasis ulcer right lateral calf presenting for evaluation of a new wound on his left calf.  He states approximately a month ago he noticed a small "scratch" in his posterior right calf, thought it might have been a bug bite, over the past month it has expanded and yesterday the top layer sloughed off and now it is a ulcer.  Noted edema in B LE.  Mr. Lattin will benefit from  skilled PT for debridement and proper dressing to allow a healing environment.    PATIENT EDUCATION: Education details: Keep dressing dry, do not remove unless dressing becomes wet or painful  Person educated: Patient Education method: Explanation Education comprehension: verbalized understanding   HOME EXERCISE PROGRAM: Ankle pumps    GOALS: Goals reviewed with patient? No  SHORT TERM GOALS: Target date: 09/07/22  Pt wounds to be 100% granulated  Baseline: see above Goal status: IN PROGRESS  2.  Pt wounds to have decreased 50% in size  Baseline: see above Goal status: IN PROGRESS  LONG TERM GOALS: Target date: 12/28/22  Pt wounds to be healed to decrease risk of infection. Baseline:  Goal status: IN PROGRESS  2.  Pt to be using compression garments Baseline: 11/30/22:  Pt stated he plans to purchase later today Goal status: IN PROGRESS  3.  Pt to have obtained and be using a compression pump. Baseline:  Goal status: IN PROGRESS    ASSESSMENT:  CLINICAL IMPRESSION: Selective debridement for removal of slough/biofilm and dead skin perimeter to promote healing.  Both wounds have reduced in size and improved approximation/granulation tissue.  Pt stated he plans to purchase compression garments later today.  Paperwork complete for [employer] requesting approval for continued care.    OBJECTIVE IMPAIRMENTS: increased edema, pain, and decreased skin integrity  .   ACTIVITY LIMITATIONS: hygiene/grooming  PARTICIPATION LIMITATIONS: yard work  PERSONAL FACTORS: 3+ comorbidities: DM< CKD, CVI  are also affecting patient's functional outcome.   REHAB POTENTIAL: Fair    CLINICAL DECISION MAKING: Evolving/moderate complexity  EVALUATION COMPLEXITY: Moderate  PLAN: PT FREQUENCY: 2x/week  PT DURATION: 6 weeks  PLANNED INTERVENTIONS: Patient/Family education, Self Care, and debridement and dressing   PLAN FOR NEXT SESSION: Continue with debridement and dressing  change. F/U with [employer] purchase.  Becky Sax, LPTA/CLT; Rowe [place]ack [phone]

## 2022-12-04 ENCOUNTER — Ambulatory Visit (HOSPITAL_COMMUNITY): Payer: Medicaid Other | Admitting: Physical Therapy

## 2022-12-04 DIAGNOSIS — M79662 Pain in left lower leg: Secondary | ICD-10-CM

## 2022-12-04 DIAGNOSIS — M79661 Pain in right lower leg: Secondary | ICD-10-CM

## 2022-12-04 DIAGNOSIS — S81802S Unspecified open wound, left lower leg, sequela: Secondary | ICD-10-CM

## 2022-12-04 DIAGNOSIS — S81801S Unspecified open wound, right lower leg, sequela: Secondary | ICD-10-CM | POA: Diagnosis not present

## 2022-12-04 NOTE — Therapy (Signed)
OUTPATIENT PHYSICAL THERAPY Wound Treatment    Patient Name: [name] MRN: [id number] DOB:12/07/1970, 52 y.o., male Today's Date: 12/04/2022   PCP: Roxine Caddy REFERRING PROVIDER: Burgess Amor, PA-C  END OF SESSION:  PT End of Session - 12/04/22 1254     Visit Number 6    Number of Visits 12    Date for PT Re-Evaluation 12/27/22    Authorization Type medicaid healthy blue approved 5 visits 5/2-6/30;(requested  more on 5/17)    Authorization - Visit Number 5    Authorization - Number of Visits 5    Progress Note Due on Visit 12    PT Start Time 0950    PT Stop Time 1030    PT Time Calculation (min) 40 min    Activity Tolerance Patient tolerated treatment well    Behavior During Therapy [facility] for tasks assessed/performed              Past Medical History:  Diagnosis Date   Acute blood loss as cause of postoperative anemia 04/18/2017   ARF (acute renal failure) (HCC)    Cellulitis of right lower leg 03/31/2017   Chronic anticoagulation 03/31/2017   Diabetes mellitus without complication (HCC)    Diabetic ulcer of calf (HCC) 03/31/2017   DVT of proximal lower limb (HCC) 03/20/2017   R calf area   Escherichia coli sepsis (HCC) 04/18/2017   Gout    History of hiatal hernia    Hypertension    Hypoalbuminemia 03/31/2017   Necrotizing fasciitis (HCC)    Right leg DVT (HCC) 03/31/2017   Sepsis (HCC) 03/30/2017   Sinus tachycardia 03/31/2017   Type 2 diabetes mellitus with stage 2 chronic kidney disease, with long-term current use of insulin (HCC) 03/27/2017   Past Surgical History:  Procedure Laterality Date   COLONOSCOPY WITH PROPOFOL N/A 02/10/2018   Procedure: COLONOSCOPY WITH PROPOFOL;  Surgeon: Toney Reil, MD;  Location: [facility] ENDOSCOPY;  Service: Gastroenterology;  Laterality: N/A;   ELBOW SURGERY Right    DUE TO INFECTION   ESOPHAGOGASTRODUODENOSCOPY (EGD) WITH PROPOFOL N/A 02/10/2018   Procedure: ESOPHAGOGASTRODUODENOSCOPY (EGD) WITH PROPOFOL;   Surgeon: Toney Reil, MD;  Location: [facility] ENDOSCOPY;  Service: Gastroenterology;  Laterality: N/A;   ESOPHAGOGASTRODUODENOSCOPY (EGD) WITH PROPOFOL N/A 04/14/2020   Procedure: ESOPHAGOGASTRODUODENOSCOPY (EGD) WITH PROPOFOL;  Surgeon: Toney Reil, MD;  Location: [facility] SURGERY CNTR;  Service: Endoscopy;  Laterality: N/A;  diabetic - insulin   I & D EXTREMITY Right 04/03/2017   Procedure: IRRIGATION AND DEBRIDEMENT RIGHT LEG, APPLY WOUND VAC;  Surgeon: Nadara Mustard, MD;  Location: [facility] OR;  Service: Orthopedics;  Laterality: Right;   I & D EXTREMITY Right 04/05/2017   Procedure: IRRIGATION AND DEBRIDEMENT RIGHT LEG;  Surgeon: Nadara Mustard, MD;  Location: [facility] OR;  Service: Orthopedics;  Laterality: Right;   IRRIGATION AND DEBRIDEMENT ABSCESS Right 12/30/2018   Procedure: MINOR INCISION AND DRAINAGE OF ABSCESS;  Surgeon: Ernest Mallick, MD;  Location: [facility] ORS;  Service: Orthopedics;  Laterality: Right;   SCROTAL SURGERY  2006   SKIN SPLIT GRAFT Right 04/10/2017   Procedure: SKIN GRAFT SPLIT THICKNESS RIGHT LEG;  Surgeon: Nadara Mustard, MD;  Location: [facility] OR;  Service: Orthopedics;  Laterality: Right;   XI ROBOTIC ASSISTED INGUINAL HERNIA REPAIR WITH MESH Bilateral 09/01/2020   Procedure: XI ROBOTIC ASSISTED INGUINAL HERNIA REPAIR WITH MESH;  Surgeon: Sung Amabile, DO;  Location: [facility] ORS;  Service: General;  Laterality: Bilateral;   Patient Active Problem List  Diagnosis Date Noted   Acute CVA (cerebrovascular accident) (HCC) 11/26/2020   CKD (chronic kidney disease) 11/26/2020   Abdominal pain, chronic, epigastric    Arthralgia of right elbow 01/13/2019   Venous stasis ulcer (HCC) 12/31/2018   Chronic kidney disease (CKD) stage G2/A2, mildly decreased glomerular filtration rate (GFR) between 60-89 mL/min/1.73 square meter and albuminuria creatinine ratio between 30-299 mg/g 12/31/2018   Septic arthritis of elbow, right (HCC) 12/30/2018   Chronic venous insufficiency 02/06/2018    Chronic anticoagulation 03/31/2017   Type 2 diabetes mellitus with stage 2 chronic kidney disease, with long-term current use of insulin (HCC) 03/27/2017   History of DVT (deep vein thrombosis) 03/27/2017   Essential hypertension 01/28/2008    ONSET DATE: acute wound 09/04/22; chronic 2018  REFERRING DIAG: non healing CVI wound Rt LE; non healing CVA wound Lt LE  THERAPY DIAG:  Non healing Rt LE wound Non healing Lt LE wound   Rationale for Evaluation and Treatment: Rehabilitation     Wound Therapy - 12/04/22 1305     Subjective Pt comes today wtih knee highs purchased from [employer]. Dressings intact.  STates he is working 6 days a week third shift.    Patient and Family Stated Goals wounds to heal    Date of Onset --   Right 2018; Left March   Pain Scale 0-10    Pain Score 0-No pain    Evaluation and Treatment Procedures Explained to Patient/Family Yes    Evaluation and Treatment Procedures agreed to    Wound Properties Date First Assessed: 11/15/22 Time First Assessed: 1039 Wound Type: Diabetic ulcer Location: Leg Location Orientation: Right;Lateral Present on Admission: Yes   Dressing Type Silver hydrofiber;Compression wrap;Abdominal pads    Dressing Changed Changed    Dressing Status Old drainage    Dressing Change Frequency PRN    Site / Wound Assessment Painful;Dusky;Friable    % Wound base Red or Granulating 90%    % Wound base Yellow/Fibrinous Exudate 10%    Peri-wound Assessment Edema    Drainage Amount Moderate    Drainage Description Serosanguineous    Treatment Cleansed;Debridement (Selective)    Wound Properties Date First Assessed: 11/15/22 Time First Assessed: 1040 Wound Type: Skin tear Location: Leg Location Orientation: Left;Proximal;Posterior Present on Admission: Yes   Dressing Type Impregnated gauze (bismuth);Alginate;Abdominal pads;Compression wrap    Dressing Changed Changed    Dressing Status Old drainage    Dressing Change Frequency PRN    Site / Wound  Assessment Granulation tissue    % Wound base Red or Granulating 100%   hypergranulated   % Wound base Yellow/Fibrinous Exudate 5%    Peri-wound Assessment Intact    Drainage Amount Minimal    Drainage Description Serosanguineous    Treatment Cleansed;Debridement (Selective)    Selective Debridement (non-excisional) - Location edges of wound    Selective Debridement (non-excisional) - Tools Used Forceps;Scalpel;Scissors    Selective Debridement (non-excisional) - Tissue Removed devitalized tissue, biofilm    Wound Therapy - Clinical Statement see below    Wound Therapy - Functional Problem List dressing, bathing    Factors Delaying/Impairing Wound Healing Diabetes Mellitus;Altered sensation;Multiple medical problems;Polypharmacy;Vascular compromise    Hydrotherapy Plan Debridement;Dressing change;Patient/family education    Wound Therapy - Frequency 2X / week    Wound Therapy - Current Recommendations PT    Wound Plan cleanse, moisturize, debridement and compression dressing    Dressing  Rt: silver hydrofiber, 4X4, ABD, profore wrap; #5 netting bil LE's    Dressing  Lt: silver hydrofiber, 1/2" foam, profore, netting #5             [name] is a 52 y.o. male with a history including type 2 diabetes, chronic kidney disease, venous insufficiency, history of DVT and chronic venous stasis ulcer right lateral calf presenting for evaluation of a new wound on his left calf.  He states approximately a month ago he noticed a small "scratch" in his posterior right calf, thought it might have been a bug bite, over the past month it has expanded and yesterday the top layer sloughed off and now it is a ulcer.  Noted edema in B LE.  Mr. Kina will benefit from skilled PT for debridement and proper dressing to allow a healing environment.    PATIENT EDUCATION: Education details: Keep dressing dry, do not remove unless dressing becomes wet or painful  Person educated: Patient Education  method: Explanation Education comprehension: verbalized understanding   HOME EXERCISE PROGRAM: Ankle pumps    GOALS: Goals reviewed with patient? No  SHORT TERM GOALS: Target date: 09/07/22  Pt wounds to be 100% granulated  Baseline: see above Goal status: IN PROGRESS  2.  Pt wounds to have decreased 50% in size  Baseline: see above Goal status: IN PROGRESS  LONG TERM GOALS: Target date: 12/28/22  Pt wounds to be healed to decrease risk of infection. Baseline:  Goal status: IN PROGRESS  2.  Pt to be using compression garments Baseline: 11/30/22:  Pt stated he plans to purchase later today Goal status: IN PROGRESS  3.  Pt to have obtained and be using a compression pump. Baseline:  Goal status: IN PROGRESS    ASSESSMENT:  CLINICAL IMPRESSION: Wounds continue to approximate and fill in with healthy granulated tissue.  Lt LE wound is actually hyper granulated today with change of dressing to silver hydrofiber and addition of foam to help approximate to skin borders.  Both LE's cleansed and moisturized well prior to re bandaging.  Less debridement required of Rt LE wound, however still has approx 10% adherent slough present.  Pt has currently not met any short or long term goals as his co-morbidities are delaying wound healing.  Pt has acquired his knee high compression garments and will be ready to transition once the wounds are healed. Paperwork complete for [employer] requesting approval for continued care at last session but not returned yet.    OBJECTIVE IMPAIRMENTS: increased edema, pain, and decreased skin integrity  .   ACTIVITY LIMITATIONS: hygiene/grooming  PARTICIPATION LIMITATIONS: yard work  PERSONAL FACTORS: 3+ comorbidities: DM< CKD, CVI  are also affecting patient's functional outcome.   REHAB POTENTIAL: Fair    CLINICAL DECISION MAKING: Evolving/moderate complexity  EVALUATION COMPLEXITY: Moderate  PLAN: PT FREQUENCY: 2x/week  PT DURATION: 6  weeks  PLANNED INTERVENTIONS: Patient/Family education, Self Care, and debridement and dressing   PLAN FOR NEXT SESSION: Continue with debridement and dressing change. Transition to compression stockings when wounds are healed. Check for insurance approval.  Lurena Nida, PTA/CLT [facility] Ph: [phone]   Lurena Nida, PTA 12/04/2022, 4:07 PM

## 2022-12-06 ENCOUNTER — Encounter (HOSPITAL_COMMUNITY): Payer: Self-pay

## 2022-12-06 ENCOUNTER — Ambulatory Visit (HOSPITAL_COMMUNITY): Payer: Medicaid Other

## 2022-12-06 DIAGNOSIS — S81802S Unspecified open wound, left lower leg, sequela: Secondary | ICD-10-CM

## 2022-12-06 DIAGNOSIS — S81801S Unspecified open wound, right lower leg, sequela: Secondary | ICD-10-CM | POA: Diagnosis not present

## 2022-12-06 DIAGNOSIS — M79661 Pain in right lower leg: Secondary | ICD-10-CM | POA: Diagnosis not present

## 2022-12-06 DIAGNOSIS — M79662 Pain in left lower leg: Secondary | ICD-10-CM

## 2022-12-06 NOTE — Therapy (Signed)
OUTPATIENT PHYSICAL THERAPY Wound Treatment    Patient Name: [name] MRN: [id number] DOB:Jan 24, 1971, 52 y.o., male Today's Date: 12/06/2022   PCP: Roxine Caddy REFERRING PROVIDER: Burgess Amor, PA-C  END OF SESSION:  PT End of Session - 12/06/22 1252     Visit Number 7    Number of Visits 12    Date for PT Re-Evaluation 12/27/22    Authorization Type medicaid healthy blue approved 5 visits 5/2-6/30;(requested  more on 5/17)    Authorization Time Period no prior auth needed for coded [id number] and [id number] per [employer]    Progress Note Due on Visit 12    PT Start Time 1047    PT Stop Time 1134    PT Time Calculation (min) 47 min    Activity Tolerance Patient tolerated treatment well    Behavior During Therapy [facility] for tasks assessed/performed              Past Medical History:  Diagnosis Date   Acute blood loss as cause of postoperative anemia 04/18/2017   ARF (acute renal failure) (HCC)    Cellulitis of right lower leg 03/31/2017   Chronic anticoagulation 03/31/2017   Diabetes mellitus without complication (HCC)    Diabetic ulcer of calf (HCC) 03/31/2017   DVT of proximal lower limb (HCC) 03/20/2017   R calf area   Escherichia coli sepsis (HCC) 04/18/2017   Gout    History of hiatal hernia    Hypertension    Hypoalbuminemia 03/31/2017   Necrotizing fasciitis (HCC)    Right leg DVT (HCC) 03/31/2017   Sepsis (HCC) 03/30/2017   Sinus tachycardia 03/31/2017   Type 2 diabetes mellitus with stage 2 chronic kidney disease, with long-term current use of insulin (HCC) 03/27/2017   Past Surgical History:  Procedure Laterality Date   COLONOSCOPY WITH PROPOFOL N/A 02/10/2018   Procedure: COLONOSCOPY WITH PROPOFOL;  Surgeon: Toney Reil, MD;  Location: [facility] ENDOSCOPY;  Service: Gastroenterology;  Laterality: N/A;   ELBOW SURGERY Right    DUE TO INFECTION   ESOPHAGOGASTRODUODENOSCOPY (EGD) WITH PROPOFOL N/A 02/10/2018   Procedure: ESOPHAGOGASTRODUODENOSCOPY (EGD)  WITH PROPOFOL;  Surgeon: Toney Reil, MD;  Location: [facility] ENDOSCOPY;  Service: Gastroenterology;  Laterality: N/A;   ESOPHAGOGASTRODUODENOSCOPY (EGD) WITH PROPOFOL N/A 04/14/2020   Procedure: ESOPHAGOGASTRODUODENOSCOPY (EGD) WITH PROPOFOL;  Surgeon: Toney Reil, MD;  Location: [facility] SUR[facility]ERY [facility];  Service: Endoscopy;  Laterality: N/A;  diabetic - insulin   I & D EXTREMITY Right 04/03/2017   Procedure: IRRIGATION AND DEBRIDEMENT RIGHT LEG, APPLY WOUND VAC;  Surgeon: Nadara Mustard, MD;  Location: [facility] OR;  Service: Orthopedics;  Laterality: Right;   I & D EXTREMITY Right 04/05/2017   Procedure: IRRIGATION AND DEBRIDEMENT RIGHT LEG;  Surgeon: Nadara Mustard, MD;  Location: [facility] OR;  Service: Orthopedics;  Laterality: Right;   IRRIGATION AND DEBRIDEMENT ABSCESS Right 12/30/2018   Procedure: MINOR INCISION AND DRAINAGE OF ABSCESS;  Surgeon: Ernest Mallick, MD;  Location: [facility] ORS;  Service: Orthopedics;  Laterality: Right;   SCROTAL SURGERY  2006   SKIN SPLIT GRAFT Right 04/10/2017   Procedure: SKIN GRAFT SPLIT THICKNESS RIGHT LEG;  Surgeon: Nadara Mustard, MD;  Location: [facility] OR;  Service: Orthopedics;  Laterality: Right;   XI ROBOTIC ASSISTED INGUINAL HERNIA REPAIR WITH MESH Bilateral 09/01/2020   Procedure: XI ROBOTIC ASSISTED INGUINAL HERNIA REPAIR WITH MESH;  Surgeon: Sung Amabile, DO;  Location: [facility] ORS;  Service: General;  Laterality: Bilateral;   Patient Active Problem List  Diagnosis Date Noted   Acute CVA (cerebrovascular accident) (HCC) 11/26/2020   CKD (chronic kidney disease) 11/26/2020   Abdominal pain, chronic, epigastric    Arthralgia of right elbow 01/13/2019   Venous stasis ulcer (HCC) 12/31/2018   Chronic kidney disease (CKD) stage G2/A2, mildly decreased glomerular filtration rate (GFR) between 60-89 mL/min/1.73 square meter and albuminuria creatinine ratio between 30-299 mg/g 12/31/2018   Septic arthritis of elbow, right (HCC) 12/30/2018   Chronic venous  insufficiency 02/06/2018   Chronic anticoagulation 03/31/2017   Type 2 diabetes mellitus with stage 2 chronic kidney disease, with long-term current use of insulin (HCC) 03/27/2017   History of DVT (deep vein thrombosis) 03/27/2017   Essential hypertension 01/28/2008    ONSET DATE: acute wound 09/04/22; chronic 2018  REFERRING DIAG: non healing CVI wound Rt LE; non healing CVA wound Lt LE  THERAPY DIAG:  Non healing Rt LE wound Non healing Lt LE wound   Rationale for Evaluation and Treatment: Rehabilitation     Wound Therapy - 12/06/22 0001     Subjective Pt arrived with dressings intact, no reports of pain.  Stated he has purchased compression garments though forgot at home today.    Patient and Family Stated Goals wounds to heal    Date of Onset --   Right 2018; Left March   Pain Scale 0-10    Pain Score 0-No pain    Wound Properties Date First Assessed: 11/15/22 Time First Assessed: 1039 Wound Type: Diabetic ulcer Location: Leg Location Orientation: Right;Lateral Present on Admission: Yes   Wound Image Images linked: 1    Dressing Type Silver hydrofiber;Compression wrap;Abdominal pads    Dressing Changed Changed    Dressing Status Old drainage    Dressing Change Frequency PRN    Site / Wound Assessment Painful;Dusky;Friable    % Wound base Red or Granulating 90%    % Wound base Yellow/Fibrinous Exudate 10%    Peri-wound Assessment Edema    Wound Length (cm) 5.4 cm    Wound Width (cm) 2.2 cm    Wound Depth (cm) 0.2 cm    Wound Volume (cm^3) 2.38 cm^3    Wound Surface Area (cm^2) 11.88 cm^2    Drainage Amount Moderate    Drainage Description Serosanguineous    Treatment Cleansed;Debridement (Selective)    Wound Properties Date First Assessed: 11/15/22 Time First Assessed: 1040 Wound Type: Skin tear Location: Leg Location Orientation: Left;Proximal;Posterior Present on Admission: Yes   Wound Image Images linked: 1    Dressing Type Impregnated gauze  (bismuth);Alginate;Abdominal pads;Compression wrap    Dressing Changed Changed    Dressing Status Old drainage    Dressing Change Frequency PRN    Site / Wound Assessment Granulation tissue    % Wound base Red or Granulating 100%    % Wound base Yellow/Fibrinous Exudate 0%    Peri-wound Assessment Intact    Wound Length (cm) 1.8 cm    Wound Width (cm) 2 cm    Wound Surface Area (cm^2) 3.6 cm^2    Drainage Amount Minimal    Drainage Description Serosanguineous    Treatment Cleansed;Debridement (Selective)    Selective Debridement (non-excisional) - Location edges of wound    Selective Debridement (non-excisional) - Tools Used Forceps;Scalpel;Scissors    Selective Debridement (non-excisional) - Tissue Removed devitalized tissue, biofilm    Wound Therapy - Clinical Statement see below    Wound Therapy - Functional Problem List dressing, bathing    Factors Delaying/Impairing Wound Healing Diabetes Mellitus;Altered sensation;Multiple medical problems;Polypharmacy;Vascular  compromise    Hydrotherapy Plan Debridement;Dressing change;Patient/family education    Wound Therapy - Frequency 2X / week    Wound Therapy - Current Recommendations PT    Wound Plan cleanse, moisturize, debridement and compression dressing    Dressing  Rt: silver hydrofiber, 4X4, ABD, profore wrap; #5 netting bil LE's    Dressing Lt: silver hydrofiber, 1/2" foam, profore, netting #5             Cypher K Litten is a 52 y.o. male with a history including type 2 diabetes, chronic kidney disease, venous insufficiency, history of DVT and chronic venous stasis ulcer right lateral calf presenting for evaluation of a new wound on his left calf.  He states approximately a month ago he noticed a small "scratch" in his posterior right calf, thought it might have been a bug bite, over the past month it has expanded and yesterday the top layer sloughed off and now it is a ulcer.  Noted edema in B LE.  Mr. Rappaport will benefit  from skilled PT for debridement and proper dressing to allow a healing environment.    PATIENT EDUCATION: Education details: Keep dressing dry, do not remove unless dressing becomes wet or painful  Person educated: Patient Education method: Explanation Education comprehension: verbalized understanding   HOME EXERCISE PROGRAM: Ankle pumps    GOALS: Goals reviewed with patient? No  SHORT TERM GOALS: Target date: 09/07/22  Pt wounds to be 100% granulated  Baseline: see above Goal status: IN PROGRESS  2.  Pt wounds to have decreased 50% in size  Baseline: see above Goal status: IN PROGRESS  LONG TERM GOALS: Target date: 12/28/22  Pt wounds to be healed to decrease risk of infection. Baseline:  Goal status: IN PROGRESS  2.  Pt to be using compression garments Baseline: 11/30/22:  Pt stated he plans to purchase later today Goal status: IN PROGRESS  3.  Pt to have obtained and be using a compression pump. Baseline:  Goal status: IN PROGRESS    ASSESSMENT:  CLINICAL IMPRESSION: Wounds continue to approximate with granulation tissue.  Foam assisted with hypergranulated tissues on Lt LE.  Both LEs cleansed and moisturized well prior to rebandaging.  Continued with silverhydrofiber as wounds as improving.  Reports of comfort at EOS.    OBJECTIVE IMPAIRMENTS: increased edema, pain, and decreased skin integrity  .   ACTIVITY LIMITATIONS: hygiene/grooming  PARTICIPATION LIMITATIONS: yard work  PERSONAL FACTORS: 3+ comorbidities: DM< CKD, CVI  are also affecting patient's functional outcome.   REHAB POTENTIAL: Fair    CLINICAL DECISION MAKING: Evolving/moderate complexity  EVALUATION COMPLEXITY: Moderate  PLAN: PT FREQUENCY: 2x/week  PT DURATION: 6 weeks  PLANNED INTERVENTIONS: Patient/Family education, Self Care, and debridement and dressing   PLAN FOR NEXT SESSION: Continue with debridement and dressing change. Transition to compression stockings when wounds are  healed. Check for insurance approval.  Becky Sax, LPTA/CLT; CBIS [phone]  Juel Burrow, PTA 12/06/2022, 1:11 PM

## 2022-12-11 ENCOUNTER — Ambulatory Visit (HOSPITAL_COMMUNITY): Payer: Medicaid Other

## 2022-12-11 ENCOUNTER — Encounter (HOSPITAL_COMMUNITY): Payer: Self-pay

## 2022-12-11 DIAGNOSIS — M79662 Pain in left lower leg: Secondary | ICD-10-CM | POA: Diagnosis not present

## 2022-12-11 DIAGNOSIS — S81802S Unspecified open wound, left lower leg, sequela: Secondary | ICD-10-CM

## 2022-12-11 DIAGNOSIS — M79661 Pain in right lower leg: Secondary | ICD-10-CM

## 2022-12-11 DIAGNOSIS — S81801S Unspecified open wound, right lower leg, sequela: Secondary | ICD-10-CM

## 2022-12-11 NOTE — Therapy (Signed)
OUTPATIENT PHYSICAL THERAPY Wound Treatment    Patient Name: [name] MRN: [id number] DOB:1970/09/06, 52 y.o., male Today's Date: 12/11/2022   PCP: Roxine Caddy REFERRING PROVIDER: Burgess Amor, PA-C  END OF SESSION:  PT End of Session - 12/11/22 1607     Visit Number 8    Number of Visits 12    Date for PT Re-Evaluation 12/27/22    Authorization Type medicaid healthy blue approved 5 visits 5/2-6/30;(requested  more on 5/17)    Authorization Time Period no prior auth needed for coded [id number] and [id number] per [employer]    Progress Note Due on Visit 12    PT Start Time 1145    PT Stop Time 1228    PT Time Calculation (min) 43 min    Activity Tolerance Patient tolerated treatment well    Behavior During Therapy [facility] for tasks assessed/performed              Past Medical History:  Diagnosis Date   Acute blood loss as cause of postoperative anemia 04/18/2017   ARF (acute renal failure) (HCC)    Cellulitis of right lower leg 03/31/2017   Chronic anticoagulation 03/31/2017   Diabetes mellitus without complication (HCC)    Diabetic ulcer of calf (HCC) 03/31/2017   DVT of proximal lower limb (HCC) 03/20/2017   R calf area   Escherichia coli sepsis (HCC) 04/18/2017   Gout    History of hiatal hernia    Hypertension    Hypoalbuminemia 03/31/2017   Necrotizing fasciitis (HCC)    Right leg DVT (HCC) 03/31/2017   Sepsis (HCC) 03/30/2017   Sinus tachycardia 03/31/2017   Type 2 diabetes mellitus with stage 2 chronic kidney disease, with long-term current use of insulin (HCC) 03/27/2017   Past Surgical History:  Procedure Laterality Date   COLONOSCOPY WITH PROPOFOL N/A 02/10/2018   Procedure: COLONOSCOPY WITH PROPOFOL;  Surgeon: Toney Reil, MD;  Location: [facility] ENDOSCOPY;  Service: Gastroenterology;  Laterality: N/A;   ELBOW SURGERY Right    DUE TO INFECTION   ESOPHAGOGASTRODUODENOSCOPY (EGD) WITH PROPOFOL N/A 02/10/2018   Procedure: ESOPHAGOGASTRODUODENOSCOPY (EGD)  WITH PROPOFOL;  Surgeon: Toney Reil, MD;  Location: [facility] ENDOSCOPY;  Service: Gastroenterology;  Laterality: N/A;   ESOPHAGOGASTRODUODENOSCOPY (EGD) WITH PROPOFOL N/A 04/14/2020   Procedure: ESOPHAGOGASTRODUODENOSCOPY (EGD) WITH PROPOFOL;  Surgeon: Toney Reil, MD;  Location: [facility];  Service: Endoscopy;  Laterality: N/A;  diabetic - insulin   I & D EXTREMITY Right 04/03/2017   Procedure: IRRIGATION AND DEBRIDEMENT RIGHT LEG, APPLY WOUND VAC;  Surgeon: Nadara Mustard, MD;  Location: [facility] OR;  Service: Orthopedics;  Laterality: Right;   I & D EXTREMITY Right 04/05/2017   Procedure: IRRIGATION AND DEBRIDEMENT RIGHT LEG;  Surgeon: Nadara Mustard, MD;  Location: [facility] OR;  Service: Orthopedics;  Laterality: Right;   IRRIGATION AND DEBRIDEMENT ABSCESS Right 12/30/2018   Procedure: MINOR INCISION AND DRAINAGE OF ABSCESS;  Surgeon: Ernest Mallick, MD;  Location: [facility] ORS;  Service: Orthopedics;  Laterality: Right;   SCROTAL SURGERY  2006   SKIN SPLIT GRAFT Right 04/10/2017   Procedure: SKIN GRAFT SPLIT THICKNESS RIGHT LEG;  Surgeon: Nadara Mustard, MD;  Location: [facility] OR;  Service: Orthopedics;  Laterality: Right;   XI ROBOTIC ASSISTED INGUINAL HERNIA REPAIR WITH MESH Bilateral 09/01/2020   Procedure: XI ROBOTIC ASSISTED INGUINAL HERNIA REPAIR WITH MESH;  Surgeon: Sung Amabile, DO;  Location: [facility] ORS;  Service: General;  Laterality: Bilateral;   Patient Active Problem List  Diagnosis Date Noted   Acute CVA (cerebrovascular accident) (HCC) 11/26/2020   CKD (chronic kidney disease) 11/26/2020   Abdominal pain, chronic, epigastric    Arthralgia of right elbow 01/13/2019   Venous stasis ulcer (HCC) 12/31/2018   Chronic kidney disease (CKD) stage G2/A2, mildly decreased glomerular filtration rate (GFR) between 60-89 mL/min/1.73 square meter and albuminuria creatinine ratio between 30-299 mg/g 12/31/2018   Septic arthritis of elbow, right (HCC) 12/30/2018   Chronic venous  insufficiency 02/06/2018   Chronic anticoagulation 03/31/2017   Type 2 diabetes mellitus with stage 2 chronic kidney disease, with long-term current use of insulin (HCC) 03/27/2017   History of DVT (deep vein thrombosis) 03/27/2017   Essential hypertension 01/28/2008    ONSET DATE: acute wound 09/04/22; chronic 2018  REFERRING DIAG: non healing CVI wound Rt LE; non healing CVA wound Lt LE  THERAPY DIAG:  Non healing Rt LE wound Non healing Lt LE wound   Rationale for Evaluation and Treatment: Rehabilitation     Wound Therapy - 12/11/22 0001     Subjective Pt late for apt today.  Arrived with dressings intact, no reports of pain today.    Patient and Family Stated Goals wounds to heal    Date of Onset --   Right 2018; Left March 2024   Pain Scale 0-10    Pain Score 0-No pain    Wound Properties Date First Assessed: 11/15/22 Time First Assessed: 1039 Wound Type: Diabetic ulcer Location: Leg Location Orientation: Right;Lateral Present on Admission: Yes   Dressing Type Silver hydrofiber;Compression wrap;Abdominal pads   Rt: silver hydrofiber, 4X4, ABD, profore wrap; #5 netting bil LE's   Dressing Changed Changed    Dressing Status Old drainage    Dressing Change Frequency PRN    Site / Wound Assessment Painful;Dusky;Friable    % Wound base Red or Granulating 90%    % Wound base Yellow/Fibrinous Exudate 10%    Peri-wound Assessment Edema    Drainage Amount Moderate    Drainage Description Serosanguineous    Treatment Cleansed;Debridement (Selective)    Wound Properties Date First Assessed: 11/15/22 Time First Assessed: 1040 Wound Type: Skin tear Location: Leg Location Orientation: Left;Proximal;Posterior Present on Admission: Yes   Dressing Type Silver hydrofiber;Gauze (Comment);Compression wrap   Lt: silver hydrofiber, 1/2" foam, profore, netting #5   Dressing Changed Changed    Dressing Status Old drainage    Dressing Change Frequency PRN    Site / Wound Assessment Granulation  tissue    % Wound base Red or Granulating 100%    % Wound base Yellow/Fibrinous Exudate 0%    Peri-wound Assessment Intact    Drainage Amount Minimal    Drainage Description Serosanguineous    Treatment Cleansed;Debridement (Selective)    Selective Debridement (non-excisional) - Location edges of wound    Selective Debridement (non-excisional) - Tools Used Forceps;Scalpel;Scissors    Selective Debridement (non-excisional) - Tissue Removed devitalized tissue, biofilm    Wound Therapy - Clinical Statement see below    Wound Therapy - Functional Problem List dressing, bathing    Factors Delaying/Impairing Wound Healing Diabetes Mellitus;Altered sensation;Multiple medical problems;Polypharmacy;Vascular compromise    Hydrotherapy Plan Debridement;Dressing change;Patient/family education    Wound Therapy - Frequency 2X / week    Wound Therapy - Current Recommendations PT    Wound Plan cleanse, moisturize, debridement and compression dressing    Dressing  Rt: silver hydrofiber, 4X4, ABD, profore wrap; #5 netting bil LE's    Dressing Lt: silver hydrofiber, 1/2" foam, profore, netting #5  [name] is a 52 y.o. male with a history including type 2 diabetes, chronic kidney disease, venous insufficiency, history of DVT and chronic venous stasis ulcer right lateral calf presenting for evaluation of a new wound on his left calf.  He states approximately a month ago he noticed a small "scratch" in his posterior right calf, thought it might have been a bug bite, over the past month it has expanded and yesterday the top layer sloughed off and now it is a ulcer.  Noted edema in B LE.  Mr. Roses will benefit from skilled PT for debridement and proper dressing to allow a healing environment.    PATIENT EDUCATION: Education details: Keep dressing dry, do not remove unless dressing becomes wet or painful  Person educated: Patient Education method: Explanation Education  comprehension: verbalized understanding   HOME EXERCISE PROGRAM: Ankle pumps    GOALS: Goals reviewed with patient? No  SHORT TERM GOALS: Target date: 09/07/22  Pt wounds to be 100% granulated  Baseline: see above Goal status: IN PROGRESS  2.  Pt wounds to have decreased 50% in size  Baseline: see above Goal status: IN PROGRESS  LONG TERM GOALS: Target date: 12/28/22  Pt wounds to be healed to decrease risk of infection. Baseline:  Goal status: IN PROGRESS  2.  Pt to be using compression garments Baseline: 11/30/22:  Pt stated he plans to purchase later today Goal status: IN PROGRESS  3.  Pt to have obtained and be using a compression pump. Baseline:  Goal status: IN PROGRESS    ASSESSMENT:  CLINICAL IMPRESSION: Pt brought new compression garments with him this session.  Selective debridement complete to address devitalized tissue from wound bed and surrounding tissue to promote healing.  Lt LE continues to be hypergranulated with blood present from wound.  Continued with foam to assist.  Did not transition to compression garments this session due to blood drainage.  Continued with silverhydrofiber and profore for edema control.  OBJECTIVE IMPAIRMENTS: increased edema, pain, and decreased skin integrity  .   ACTIVITY LIMITATIONS: hygiene/grooming  PARTICIPATION LIMITATIONS: yard work  PERSONAL FACTORS: 3+ comorbidities: DM< CKD, CVI  are also affecting patient's functional outcome.   REHAB POTENTIAL: Fair    CLINICAL DECISION MAKING: Evolving/moderate complexity  EVALUATION COMPLEXITY: Moderate  PLAN: PT FREQUENCY: 2x/week  PT DURATION: 6 weeks  PLANNED INTERVENTIONS: Patient/Family education, Self Care, and debridement and dressing   PLAN FOR NEXT SESSION: Continue with debridement and dressing change. Transition to compression stockings when wounds are healed. Check for insurance approval.  Becky Sax, LPTA/CLT; CBIS [phone]  Juel Burrow, PTA 12/11/2022, 4:15 PM

## 2022-12-13 DIAGNOSIS — I890 Lymphedema, not elsewhere classified: Secondary | ICD-10-CM | POA: Diagnosis not present

## 2022-12-14 ENCOUNTER — Encounter (HOSPITAL_COMMUNITY): Payer: Self-pay

## 2022-12-14 ENCOUNTER — Ambulatory Visit (HOSPITAL_COMMUNITY): Payer: Medicaid Other

## 2022-12-14 DIAGNOSIS — S81802S Unspecified open wound, left lower leg, sequela: Secondary | ICD-10-CM | POA: Diagnosis not present

## 2022-12-14 DIAGNOSIS — M79661 Pain in right lower leg: Secondary | ICD-10-CM

## 2022-12-14 DIAGNOSIS — S81801S Unspecified open wound, right lower leg, sequela: Secondary | ICD-10-CM | POA: Diagnosis not present

## 2022-12-14 DIAGNOSIS — M79662 Pain in left lower leg: Secondary | ICD-10-CM | POA: Diagnosis not present

## 2022-12-14 NOTE — Therapy (Signed)
OUTPATIENT PHYSICAL THERAPY Wound Treatment    Patient Name: [name] MRN: [id number] DOB:November 09, 1970, 52 y.o., male Today's Date: 12/14/2022   PCP: Roxine Caddy REFERRING PROVIDER: Burgess Amor, PA-C  END OF SESSION:  PT End of Session - 12/14/22 1245     Visit Number 9    Number of Visits 12    Date for PT Re-Evaluation 12/27/22    Authorization Time Period no prior auth needed for coded [id number] and [id number] per [employer]    Progress Note Due on Visit 12    PT Start Time 1127    PT Stop Time 1216    PT Time Calculation (min) 49 min    Activity Tolerance Patient tolerated treatment well    Behavior During Therapy [facility] for tasks assessed/performed               Past Medical History:  Diagnosis Date   Acute blood loss as cause of postoperative anemia 04/18/2017   ARF (acute renal failure) (HCC)    Cellulitis of right lower leg 03/31/2017   Chronic anticoagulation 03/31/2017   Diabetes mellitus without complication (HCC)    Diabetic ulcer of calf (HCC) 03/31/2017   DVT of proximal lower limb (HCC) 03/20/2017   R calf area   Escherichia coli sepsis (HCC) 04/18/2017   Gout    History of hiatal hernia    Hypertension    Hypoalbuminemia 03/31/2017   Necrotizing fasciitis (HCC)    Right leg DVT (HCC) 03/31/2017   Sepsis (HCC) 03/30/2017   Sinus tachycardia 03/31/2017   Type 2 diabetes mellitus with stage 2 chronic kidney disease, with long-term current use of insulin (HCC) 03/27/2017   Past Surgical History:  Procedure Laterality Date   COLONOSCOPY WITH PROPOFOL N/A 02/10/2018   Procedure: COLONOSCOPY WITH PROPOFOL;  Surgeon: Toney Reil, MD;  Location: [facility] ENDOSCOPY;  Service: Gastroenterology;  Laterality: N/A;   ELBOW SURGERY Right    DUE TO INFECTION   ESOPHAGOGASTRODUODENOSCOPY (EGD) WITH PROPOFOL N/A 02/10/2018   Procedure: ESOPHAGOGASTRODUODENOSCOPY (EGD) WITH PROPOFOL;  Surgeon: Toney Reil, MD;  Location: [facility] ENDOSCOPY;  Service:  Gastroenterology;  Laterality: N/A;   ESOPHAGOGASTRODUODENOSCOPY (EGD) WITH PROPOFOL N/A 04/14/2020   Procedure: ESOPHAGOGASTRODUODENOSCOPY (EGD) WITH PROPOFOL;  Surgeon: Toney Reil, MD;  Location: [facility];  Service: Endoscopy;  Laterality: N/A;  diabetic - insulin   I & D EXTREMITY Right 04/03/2017   Procedure: IRRIGATION AND DEBRIDEMENT RIGHT LEG, APPLY WOUND VAC;  Surgeon: Nadara Mustard, MD;  Location: [facility] OR;  Service: Orthopedics;  Laterality: Right;   I & D EXTREMITY Right 04/05/2017   Procedure: IRRIGATION AND DEBRIDEMENT RIGHT LEG;  Surgeon: Nadara Mustard, MD;  Location: [facility] OR;  Service: Orthopedics;  Laterality: Right;   IRRIGATION AND DEBRIDEMENT ABSCESS Right 12/30/2018   Procedure: MINOR INCISION AND DRAINAGE OF ABSCESS;  Surgeon: Ernest Mallick, MD;  Location: [facility] ORS;  Service: Orthopedics;  Laterality: Right;   SCROTAL SURGERY  2006   SKIN SPLIT GRAFT Right 04/10/2017   Procedure: SKIN GRAFT SPLIT THICKNESS RIGHT LEG;  Surgeon: Nadara Mustard, MD;  Location: [facility] OR;  Service: Orthopedics;  Laterality: Right;   XI ROBOTIC ASSISTED INGUINAL HERNIA REPAIR WITH MESH Bilateral 09/01/2020   Procedure: XI ROBOTIC ASSISTED INGUINAL HERNIA REPAIR WITH MESH;  Surgeon: Sung Amabile, DO;  Location: [facility] ORS;  Service: General;  Laterality: Bilateral;   Patient Active Problem List   Diagnosis Date Noted   Acute CVA (cerebrovascular accident) (HCC) 11/26/2020  CKD (chronic kidney disease) 11/26/2020   Abdominal pain, chronic, epigastric    Arthralgia of right elbow 01/13/2019   Venous stasis ulcer (HCC) 12/31/2018   Chronic kidney disease (CKD) stage G2/A2, mildly decreased glomerular filtration rate (GFR) between 60-89 mL/min/1.73 square meter and albuminuria creatinine ratio between 30-299 mg/g 12/31/2018   Septic arthritis of elbow, right (HCC) 12/30/2018   Chronic venous insufficiency 02/06/2018   Chronic anticoagulation 03/31/2017   Type 2 diabetes mellitus with  stage 2 chronic kidney disease, with long-term current use of insulin (HCC) 03/27/2017   History of DVT (deep vein thrombosis) 03/27/2017   Essential hypertension 01/28/2008    ONSET DATE: acute wound 09/04/22; chronic 2018  REFERRING DIAG: non healing CVI wound Rt LE; non healing CVA wound Lt LE  THERAPY DIAG:  Non healing Rt LE wound Non healing Lt LE wound   Rationale for Evaluation and Treatment: Rehabilitation     Wound Therapy - 12/14/22 0001     Subjective Arrived with dressings intact, no reports of pain today.    Patient and Family Stated Goals wounds to heal    Date of Onset --   Right 2018, Left March 2024   Pain Scale 0-10    Pain Score 0-No pain    Evaluation and Treatment Procedures Explained to Patient/Family Yes    Evaluation and Treatment Procedures agreed to    Wound Properties Date First Assessed: 11/15/22 Time First Assessed: 1039 Wound Type: Diabetic ulcer Location: Leg Location Orientation: Right;Lateral Present on Admission: Yes   Wound Image Images linked: 1    Dressing Type Silver hydrofiber;Compression wrap;Abdominal pads    Dressing Changed Changed    Dressing Status Old drainage    Dressing Change Frequency PRN    Site / Wound Assessment Painful;Dusky;Friable    % Wound base Red or Granulating 90%    % Wound base Yellow/Fibrinous Exudate 10%    Peri-wound Assessment Edema    Wound Length (cm) 5 cm    Wound Width (cm) 2.2 cm    Wound Depth (cm) 0.2 cm    Wound Volume (cm^3) 2.2 cm^3    Wound Surface Area (cm^2) 11 cm^2    Drainage Amount Moderate    Drainage Description Serosanguineous    Treatment Cleansed;Debridement (Selective)    Wound Properties Date First Assessed: 11/15/22 Time First Assessed: 1040 Wound Type: Skin tear Location: Leg Location Orientation: Left;Proximal;Posterior Present on Admission: Yes   Wound Image Images linked: 1    Dressing Type Silver hydrofiber;Gauze (Comment);Compression wrap    Dressing Changed Changed     Dressing Status Old drainage    Dressing Change Frequency PRN    Site / Wound Assessment Granulation tissue    % Wound base Red or Granulating 100%    % Wound base Yellow/Fibrinous Exudate 0%    Peri-wound Assessment Intact    Wound Length (cm) 1.8 cm    Wound Width (cm) 2 cm    Wound Surface Area (cm^2) 3.6 cm^2    Drainage Amount Minimal    Drainage Description Serosanguineous    Treatment Cleansed    Selective Debridement (non-excisional) - Location edges of wound    Selective Debridement (non-excisional) - Tools Used Forceps;Scalpel;Scissors    Selective Debridement (non-excisional) - Tissue Removed devitalized tissue, biofilm    Wound Therapy - Clinical Statement see below    Wound Therapy - Functional Problem List dressing, bathing    Factors Delaying/Impairing Wound Healing Diabetes Mellitus;Altered sensation;Multiple medical problems;Polypharmacy;Vascular compromise    Hydrotherapy Plan  Debridement;Dressing change;Patient/family education    Wound Therapy - Frequency 2X / week    Wound Therapy - Current Recommendations PT    Wound Plan cleanse, moisturize, debridement and compression dressing    Dressing  Rt: silver hydrofiber, 4X4, ABD, profore wrap; #5 netting bil LE's    Dressing Lt: silver hydrofiber, 1/2" foam, profore, netting #5              [name] is a 52 y.o. male with a history including type 2 diabetes, chronic kidney disease, venous insufficiency, history of DVT and chronic venous stasis ulcer right lateral calf presenting for evaluation of a new wound on his left calf.  He states approximately a month ago he noticed a small "scratch" in his posterior right calf, thought it might have been a bug bite, over the past month it has expanded and yesterday the top layer sloughed off and now it is a ulcer.  Noted edema in B LE.  Mr. Gribbins will benefit from skilled PT for debridement and proper dressing to allow a healing environment.    PATIENT  EDUCATION: Education details: Keep dressing dry, do not remove unless dressing becomes wet or painful  Person educated: Patient Education method: Explanation Education comprehension: verbalized understanding   HOME EXERCISE PROGRAM: Ankle pumps    GOALS: Goals reviewed with patient? No  SHORT TERM GOALS: Target date: 09/07/22  Pt wounds to be 100% granulated  Baseline: see above Goal status: IN PROGRESS  2.  Pt wounds to have decreased 50% in size  Baseline: see above Goal status: IN PROGRESS  LONG TERM GOALS: Target date: 12/28/22  Pt wounds to be healed to decrease risk of infection. Baseline:  Goal status: IN PROGRESS  2.  Pt to be using compression garments Baseline: 11/30/22:  Pt stated he plans to purchase later today Goal status: IN PROGRESS  3.  Pt to have obtained and be using a compression pump. Baseline:  Goal status: IN PROGRESS    ASSESSMENT:  CLINICAL IMPRESSION: Lt LE wound hypergranulated, no debridement required today just cleansed wound and continued with foam to assist.  Selective debridement for removal of devitalized tissue surround wound bed to promote healing.  Measurements taken with minimal change noted, message sent to evaluating therapist for possible additional laser as this is a chronic wound.  Continued with silverhydrofiber to address drainage with profore for edema control.    OBJECTIVE IMPAIRMENTS: increased edema, pain, and decreased skin integrity  .   ACTIVITY LIMITATIONS: hygiene/grooming  PARTICIPATION LIMITATIONS: yard work  PERSONAL FACTORS: 3+ comorbidities: DM< CKD, CVI  are also affecting patient's functional outcome.   REHAB POTENTIAL: Fair    CLINICAL DECISION MAKING: Evolving/moderate complexity  EVALUATION COMPLEXITY: Moderate  PLAN: PT FREQUENCY: 2x/week  PT DURATION: 6 weeks  PLANNED INTERVENTIONS: Patient/Family education, Self Care, and debridement and dressing   PLAN FOR NEXT SESSION: Continue with  debridement and dressing change. Transition to compression stockings when wounds are healed.  F/U with approval for additional laser to Rt LE.    Becky Sax, LPTA/CLT; CBIS [phone]  Juel Burrow, PTA 12/14/2022, 12:46 PM

## 2022-12-17 ENCOUNTER — Ambulatory Visit (HOSPITAL_COMMUNITY): Payer: Medicaid Other | Attending: Internal Medicine | Admitting: Physical Therapy

## 2022-12-17 DIAGNOSIS — M79661 Pain in right lower leg: Secondary | ICD-10-CM | POA: Diagnosis not present

## 2022-12-17 DIAGNOSIS — M79662 Pain in left lower leg: Secondary | ICD-10-CM | POA: Insufficient documentation

## 2022-12-17 DIAGNOSIS — S81802S Unspecified open wound, left lower leg, sequela: Secondary | ICD-10-CM | POA: Insufficient documentation

## 2022-12-17 DIAGNOSIS — S81801S Unspecified open wound, right lower leg, sequela: Secondary | ICD-10-CM | POA: Diagnosis not present

## 2022-12-17 NOTE — Therapy (Signed)
OUTPATIENT PHYSICAL THERAPY Wound Treatment    Patient Name: [name] MRN: [id number] DOB:[birth year], 52 y.o., male Today's Date: 12/17/2022   PCP: Roxine Caddy REFERRING PROVIDER: Burgess Amor, PA-C  END OF SESSION:  PT End of Session - 12/17/22 1256     Visit Number 10    Number of Visits 12    Date for PT Re-Evaluation 12/27/22    Authorization Time Period no prior auth needed for coded [id number] and [id number] per [employer]    Progress Note Due on Visit 12    PT Start Time 1120    PT Stop Time 1220    PT Time Calculation (min) 60 min    Activity Tolerance Patient tolerated treatment well    Behavior During Therapy [facility] for tasks assessed/performed               Past Medical History:  Diagnosis Date   Acute blood loss as cause of postoperative anemia 04/18/2017   ARF (acute renal failure) (HCC)    Cellulitis of right lower leg 03/31/2017   Chronic anticoagulation 03/31/2017   Diabetes mellitus without complication (HCC)    Diabetic ulcer of calf (HCC) 03/31/2017   DVT of proximal lower limb (HCC) 03/20/2017   R calf area   Escherichia coli sepsis (HCC) 04/18/2017   Gout    History of hiatal hernia    Hypertension    Hypoalbuminemia 03/31/2017   Necrotizing fasciitis (HCC)    Right leg DVT (HCC) 03/31/2017   Sepsis (HCC) 03/30/2017   Sinus tachycardia 03/31/2017   Type 2 diabetes mellitus with stage 2 chronic kidney disease, with long-term current use of insulin (HCC) 03/27/2017   Past Surgical History:  Procedure Laterality Date   COLONOSCOPY WITH PROPOFOL N/A 02/10/2018   Procedure: COLONOSCOPY WITH PROPOFOL;  Surgeon: Toney Reil, MD;  Location: [facility] ENDOSCOPY;  Service: Gastroenterology;  Laterality: N/A;   ELBOW SURGERY Right    DUE TO INFECTION   ESOPHAGOGASTRODUODENOSCOPY (EGD) WITH PROPOFOL N/A 02/10/2018   Procedure: ESOPHAGOGASTRODUODENOSCOPY (EGD) WITH PROPOFOL;  Surgeon: Toney Reil, MD;  Location: [facility] END[facility]COPY;  Service:  Gastroenterology;  Laterality: N/A;   ESOPHAGOGASTRODUODENOSCOPY (EGD) WITH PROPOFOL N/A 04/14/2020   Procedure: ESOPHAGOGASTRODUODENOSCOPY (EGD) WITH PROPOFOL;  Surgeon: Toney Reil, MD;  Location: [facility];  Service: Endoscopy;  Laterality: N/A;  diabetic - insulin   I & D EXTREMITY Right 04/03/2017   Procedure: IRRIGATION AND DEBRIDEMENT RIGHT LEG, APPLY WOUND VAC;  Surgeon: Nadara Mustard, MD;  Location: [facility] OR;  Service: Orthopedics;  Laterality: Right;   I & D EXTREMITY Right 04/05/2017   Procedure: IRRIGATION AND DEBRIDEMENT RIGHT LEG;  Surgeon: Nadara Mustard, MD;  Location: [facility] OR;  Service: Orthopedics;  Laterality: Right;   IRRIGATION AND DEBRIDEMENT ABSCESS Right 12/30/2018   Procedure: MINOR INCISION AND DRAINAGE OF ABSCESS;  Surgeon: Ernest Mallick, MD;  Location: [facility] ORS;  Service: Orthopedics;  Laterality: Right;   SCROTAL SURGERY  2006   SKIN SPLIT GRAFT Right 04/10/2017   Procedure: SKIN GRAFT SPLIT THICKNESS RIGHT LEG;  Surgeon: Nadara Mustard, MD;  Location: [facility] OR;  Service: Orthopedics;  Laterality: Right;   XI ROBOTIC ASSISTED INGUINAL HERNIA REPAIR WITH MESH Bilateral 09/01/2020   Procedure: XI ROBOTIC ASSISTED INGUINAL HERNIA REPAIR WITH MESH;  Surgeon: Sung Amabile, DO;  Location: [facility] ORS;  Service: General;  Laterality: Bilateral;   Patient Active Problem List   Diagnosis Date Noted   Acute CVA (cerebrovascular accident) (HCC) 11/26/2020  CKD (chronic kidney disease) 11/26/2020   Abdominal pain, chronic, epigastric    Arthralgia of right elbow 01/13/2019   Venous stasis ulcer (HCC) 12/31/2018   Chronic kidney disease (CKD) stage G2/A2, mildly decreased glomerular filtration rate (GFR) between 60-89 mL/min/1.73 square meter and albuminuria creatinine ratio between 30-299 mg/g 12/31/2018   Septic arthritis of elbow, right (HCC) 12/30/2018   Chronic venous insufficiency 02/06/2018   Chronic anticoagulation 03/31/2017   Type 2 diabetes mellitus with  stage 2 chronic kidney disease, with long-term current use of insulin (HCC) 03/27/2017   History of DVT (deep vein thrombosis) 03/27/2017   Essential hypertension 01/28/2008    ONSET DATE: acute wound 09/04/22; chronic 2018  REFERRING DIAG: non healing CVI wound Rt LE; non healing CVA wound Lt LE  THERAPY DIAG:  Non healing Rt LE wound Non healing Lt LE wound   Rationale for Evaluation and Treatment: Rehabilitation     Wound Therapy - 12/17/22 0001     Subjective pt without issues.  Contiues to work and keep dressings in place.    Patient and Family Stated Goals wounds to heal    Date of Onset --   Right 2018, Left March 2024   Pain Scale 0-10    Pain Score 0-No pain    Evaluation and Treatment Procedures Explained to Patient/Family Yes    Evaluation and Treatment Procedures agreed to    Wound Properties Date First Assessed: 11/15/22 Time First Assessed: 1039 Wound Type: Diabetic ulcer Location: Leg Location Orientation: Right;Lateral Present on Admission: Yes   Dressing Type Silver hydrofiber;Compression wrap;Abdominal pads    Dressing Changed Changed    Dressing Status Old drainage    Dressing Change Frequency PRN    Site / Wound Assessment Painful;Dusky;Friable    % Wound base Red or Granulating 90%    % Wound base Yellow/Fibrinous Exudate 10%    Peri-wound Assessment Edema    Wound Length (cm) 5.2 cm    Wound Width (cm) 2.2 cm    Wound Depth (cm) 0.2 cm    Wound Volume (cm^3) 2.29 cm^3    Wound Surface Area (cm^2) 11.44 cm^2    Drainage Amount Moderate    Drainage Description Serosanguineous    Treatment Cleansed;Debridement (Selective)    Wound Properties Date First Assessed: 11/15/22 Time First Assessed: 1040 Wound Type: Skin tear Location: Leg Location Orientation: Left;Proximal;Posterior Present on Admission: Yes   Dressing Type Silver hydrofiber;Gauze (Comment);Compression wrap    Dressing Changed Changed    Dressing Status Old drainage    Dressing Change  Frequency PRN    Site / Wound Assessment Granulation tissue    % Wound base Red or Granulating 100%    % Wound base Yellow/Fibrinous Exudate 0%    Peri-wound Assessment Intact    Wound Length (cm) 2.2 cm    Wound Width (cm) 2.2 cm    Wound Surface Area (cm^2) 4.84 cm^2    Drainage Amount Minimal    Drainage Description Serosanguineous    Treatment Cleansed    Selective Debridement (non-excisional) - Location edges of wound    Selective Debridement (non-excisional) - Tools Used Forceps;Scalpel;Scissors    Selective Debridement (non-excisional) - Tissue Removed devitalized tissue, biofilm    Wound Therapy - Clinical Statement see below    Wound Therapy - Functional Problem List dressing, bathing    Factors Delaying/Impairing Wound Healing Diabetes Mellitus;Altered sensation;Multiple medical problems;Polypharmacy;Vascular compromise    Hydrotherapy Plan Debridement;Dressing change;Patient/family education    Wound Therapy - Frequency 2X / week  Wound Therapy - Current Recommendations PT    Wound Plan cleanse, moisturize, debridement and compression dressing    Dressing  Rt: silver hydrofiber, 4X4, ABD, profore wrap; #5 netting bil LE's    Dressing Lt: silver hydrofiber, 1/2" foam, profore, netting #5    Modality laser, increase circulation, chronic continous.  1.24 minutes 60J.  Rt 4 areas; Lt 2 areas              Trayvon K Daily is a 52 y.o. male with a history including type 2 diabetes, chronic kidney disease, venous insufficiency, history of DVT and chronic venous stasis ulcer right lateral calf presenting for evaluation of a new wound on his left calf.  He states approximately a month ago he noticed a small "scratch" in his posterior right calf, thought it might have been a bug bite, over the past month it has expanded and yesterday the top layer sloughed off and now it is a ulcer.  Noted edema in B LE.  Mr. Lemm will benefit from skilled PT for debridement and proper  dressing to allow a healing environment.    PATIENT EDUCATION: Education details: Keep dressing dry, do not remove unless dressing becomes wet or painful  Person educated: Patient Education method: Explanation Education comprehension: verbalized understanding   HOME EXERCISE PROGRAM: Ankle pumps    GOALS: Goals reviewed with patient? No  SHORT TERM GOALS: Target date: 09/07/22  Pt wounds to be 100% granulated  Baseline: see above Goal status: IN PROGRESS  2.  Pt wounds to have decreased 50% in size  Baseline: see above Goal status: IN PROGRESS  LONG TERM GOALS: Target date: 12/28/22  Pt wounds to be healed to decrease risk of infection. Baseline:  Goal status: IN PROGRESS  2.  Pt to be using compression garments Baseline: 11/30/22:  Pt stated he plans to purchase later today Goal status: IN PROGRESS  3.  Pt to have obtained and be using a compression pump. Baseline:  Goal status: IN PROGRESS    ASSESSMENT:  CLINICAL IMPRESSION: Lt LE continues to be hypergranulated, friable bleeding easily.  Both measured with Lt larger and Rt without change.  Added laser treatment completing 4 areas to Rt and 2 areas to Lt.  Continued with silver hydrofiber due to drainage amounts.  Pt due for re-eval in 2 more session.  If wounds are not approximating will refer back to MD as may need alternative treatment.  Debrided edges of Rt LE wound only to promote approximation.  Lt did not require debridement. Smaller piece of foam used on Lt also.    OBJECTIVE IMPAIRMENTS: increased edema, pain, and decreased skin integrity  .   ACTIVITY LIMITATIONS: hygiene/grooming  PARTICIPATION LIMITATIONS: yard work  PERSONAL FACTORS: 3+ comorbidities: DM< CKD, CVI  are also affecting patient's functional outcome.   REHAB POTENTIAL: Fair    CLINICAL DECISION MAKING: Evolving/moderate complexity  EVALUATION COMPLEXITY: Moderate  PLAN: PT FREQUENCY: 2x/week  PT DURATION: 6 weeks  PLANNED  INTERVENTIONS: Patient/Family education, Self Care, and debridement and dressing   PLAN FOR NEXT SESSION: Continue with debridement and dressing change. Transition to compression stockings when wounds are healed (in wound bag).  F/U if any improvement with laser; continue to measure and photograph each week.  Re-evaluation in 2 more sessions.    Lurena Nida, PTA/CLT [facility] Ph: [phone]  Lurena Nida, PTA 12/17/2022, 1:01 PM

## 2022-12-20 ENCOUNTER — Ambulatory Visit (HOSPITAL_COMMUNITY): Payer: Medicaid Other

## 2022-12-20 ENCOUNTER — Encounter (HOSPITAL_COMMUNITY): Payer: Self-pay

## 2022-12-20 DIAGNOSIS — S81801S Unspecified open wound, right lower leg, sequela: Secondary | ICD-10-CM | POA: Diagnosis not present

## 2022-12-20 DIAGNOSIS — M79662 Pain in left lower leg: Secondary | ICD-10-CM | POA: Diagnosis not present

## 2022-12-20 DIAGNOSIS — M79661 Pain in right lower leg: Secondary | ICD-10-CM | POA: Diagnosis not present

## 2022-12-20 DIAGNOSIS — S81802S Unspecified open wound, left lower leg, sequela: Secondary | ICD-10-CM | POA: Diagnosis not present

## 2022-12-20 NOTE — Therapy (Signed)
OUTPATIENT PHYSICAL THERAPY Wound Treatment    Patient Name: [name] MRN: [id number] DOB:Oct 07, 1970, 52 y.o., male Today's Date: 12/20/2022   PCP: Roxine Caddy REFERRING PROVIDER: Burgess Amor, PA-C  END OF SESSION:  PT End of Session - 12/20/22 1646     Visit Number 11    Number of Visits 12    Date for PT Re-Evaluation 12/27/22    Authorization Type medicaid healthy blue approved 5 visits 5/2-6/30;(requested  more on 5/17)    Authorization Time Period no prior auth needed for coded [id number] and [id number] per [employer]    PT Start Time 1130    PT Stop Time 1230    PT Time Calculation (min) 60 min    Activity Tolerance Patient tolerated treatment well    Behavior During Therapy [facility] for tasks assessed/performed               Past Medical History:  Diagnosis Date   Acute blood loss as cause of postoperative anemia 04/18/2017   ARF (acute renal failure) (HCC)    Cellulitis of right lower leg 03/31/2017   Chronic anticoagulation 03/31/2017   Diabetes mellitus without complication (HCC)    Diabetic ulcer of calf (HCC) 03/31/2017   DVT of proximal lower limb (HCC) 03/20/2017   R calf area   Escherichia coli sepsis (HCC) 04/18/2017   Gout    History of hiatal hernia    Hypertension    Hypoalbuminemia 03/31/2017   Necrotizing fasciitis (HCC)    Right leg DVT (HCC) 03/31/2017   Sepsis (HCC) 03/30/2017   Sinus tachycardia 03/31/2017   Type 2 diabetes mellitus with stage 2 chronic kidney disease, with long-term current use of insulin (HCC) 03/27/2017   Past Surgical History:  Procedure Laterality Date   COLONOSCOPY WITH PROPOFOL N/A 02/10/2018   Procedure: COLONOSCOPY WITH PROPOFOL;  Surgeon: Toney Reil, MD;  Location: [facility] ENDOSCOPY;  Service: Gastroenterology;  Laterality: N/A;   ELBOW SURGERY Right    DUE TO INFECTION   ESOPHAGOGASTRODUODENOSCOPY (EGD) WITH PROPOFOL N/A 02/10/2018   Procedure: ESOPHAGOGASTRODUODENOSCOPY (EGD) WITH PROPOFOL;  Surgeon: Toney Reil, MD;  Location: [facility] ENDOSCOPY;  Service: Gastroenterology;  Laterality: N/A;   ESOPHAGOGASTRODUODENOSCOPY (EGD) WITH PROPOFOL N/A 04/14/2020   Procedure: ESOPHAGOGASTRODUODENOSCOPY (EGD) WITH PROPOFOL;  Surgeon: Toney Reil, MD;  Location: [facility];  Service: Endoscopy;  Laterality: N/A;  diabetic - insulin   I & D EXTREMITY Right 04/03/2017   Procedure: IRRIGATION AND DEBRIDEMENT RIGHT LEG, APPLY WOUND VAC;  Surgeon: Nadara Mustard, MD;  Location: [facility] OR;  Service: Orthopedics;  Laterality: Right;   I & D EXTREMITY Right 04/05/2017   Procedure: IRRIGATION AND DEBRIDEMENT RIGHT LEG;  Surgeon: Nadara Mustard, MD;  Location: [facility] OR;  Service: Orthopedics;  Laterality: Right;   IRRIGATION AND DEBRIDEMENT ABSCESS Right 12/30/2018   Procedure: MINOR INCISION AND DRAINAGE OF ABSCESS;  Surgeon: Ernest Mallick, MD;  Location: [facility] ORS;  Service: Orthopedics;  Laterality: Right;   SCROTAL SURGERY  2006   SKIN SPLIT GRAFT Right 04/10/2017   Procedure: SKIN GRAFT SPLIT THICKNESS RIGHT LEG;  Surgeon: Nadara Mustard, MD;  Location: [facility] OR;  Service: Orthopedics;  Laterality: Right;   XI ROBOTIC ASSISTED INGUINAL HERNIA REPAIR WITH MESH Bilateral 09/01/2020   Procedure: XI ROBOTIC ASSISTED INGUINAL HERNIA REPAIR WITH MESH;  Surgeon: Sung Amabile, DO;  Location: [facility] ORS;  Service: General;  Laterality: Bilateral;   Patient Active Problem List   Diagnosis Date Noted   Acute  CVA (cerebrovascular accident) (HCC) 11/26/2020   CKD (chronic kidney disease) 11/26/2020   Abdominal pain, chronic, epigastric    Arthralgia of right elbow 01/13/2019   Venous stasis ulcer (HCC) 12/31/2018   Chronic kidney disease (CKD) stage G2/A2, mildly decreased glomerular filtration rate (GFR) between 60-89 mL/min/1.73 square meter and albuminuria creatinine ratio between 30-299 mg/g 12/31/2018   Septic arthritis of elbow, right (HCC) 12/30/2018   Chronic venous insufficiency 02/06/2018   Chronic  anticoagulation 03/31/2017   Type 2 diabetes mellitus with stage 2 chronic kidney disease, with long-term current use of insulin (HCC) 03/27/2017   History of DVT (deep vein thrombosis) 03/27/2017   Essential hypertension 01/28/2008    ONSET DATE: acute wound 09/04/22; chronic 2018  REFERRING DIAG: non healing CVI wound Rt LE; non healing CVA wound Lt LE  THERAPY DIAG:  Non healing Rt LE wound Non healing Lt LE wound   Rationale for Evaluation and Treatment: Rehabilitation     Wound Therapy - 12/20/22 0001     Subjective Dressings intact, reports of comfort    Patient and Family Stated Goals wounds to heal    Date of Onset --   Rt 2018; Lt March 2024   Pain Scale 0-10    Pain Score 0-No pain    Evaluation and Treatment Procedures Explained to Patient/Family Yes    Evaluation and Treatment Procedures agreed to    Wound Properties Date First Assessed: 11/15/22 Time First Assessed: 1039 Wound Type: Diabetic ulcer Location: Leg Location Orientation: Right;Lateral Present on Admission: Yes   Wound Image Images linked: 1    Dressing Type Silver hydrofiber;Compression wrap;Abdominal pads    Dressing Changed Changed    Dressing Status Old drainage    Dressing Change Frequency PRN    Site / Wound Assessment Painful;Dusky;Friable    % Wound base Red or Granulating 90%    % Wound base Yellow/Fibrinous Exudate 10%    Peri-wound Assessment Edema    Wound Length (cm) 5 cm    Wound Width (cm) 2.3 cm    Wound Depth (cm) 0.2 cm    Wound Volume (cm^3) 2.3 cm^3    Wound Surface Area (cm^2) 11.5 cm^2    Drainage Amount Moderate    Drainage Description Serosanguineous    Treatment Cleansed;Debridement (Selective)   laser   Wound Properties Date First Assessed: 11/15/22 Time First Assessed: 1040 Wound Type: Skin tear Location: Leg Location Orientation: Left;Proximal;Posterior Present on Admission: Yes   Wound Image Images linked: 1    Dressing Type Silver hydrofiber;Gauze (Comment);Compression  wrap    Dressing Changed Changed    Dressing Status Old drainage    Dressing Change Frequency PRN    Site / Wound Assessment Granulation tissue    % Wound base Red or Granulating 100%    % Wound base Yellow/Fibrinous Exudate 0%    Peri-wound Assessment Intact   hypergranulated   Wound Length (cm) 2.2 cm    Wound Width (cm) 2.2 cm    Wound Surface Area (cm^2) 4.84 cm^2    Drainage Amount Moderate    Drainage Description Serosanguineous    Treatment Cleansed    Selective Debridement (non-excisional) - Location edges of wound    Selective Debridement (non-excisional) - Tools Used Forceps;Scalpel;Scissors    Selective Debridement (non-excisional) - Tissue Removed devitalized tissue, biofilm    Wound Therapy - Clinical Statement see below    Wound Therapy - Functional Problem List dressing, bathing    Factors Delaying/Impairing Wound Healing Diabetes Mellitus;Altered sensation;Multiple medical  problems;Polypharmacy;Vascular compromise    Hydrotherapy Plan Debridement;Dressing change;Patient/family education   added laser   Wound Therapy - Frequency 2X / week    Wound Therapy - Current Recommendations PT    Wound Plan cleanse, moisturize, debridement and compression dressing    Dressing  Rt: silver hydrofiber, 4X4, ABD, profore wrap; #5 netting bil LE's    Dressing Lt: silver hydrofiber, 1/2" foam, profore, netting #5    Modality laser, increase circulation, chronic continous.  1.24 minutes 60J.  Rt 4 areas              Yoni K Czerwonka is a 52 y.o. male with a history including type 2 diabetes, chronic kidney disease, venous insufficiency, history of DVT and chronic venous stasis ulcer right lateral calf presenting for evaluation of a new wound on his left calf.  He states approximately a month ago he noticed a small "scratch" in his posterior right calf, thought it might have been a bug bite, over the past month it has expanded and yesterday the top layer sloughed off and now it is a  ulcer.  Noted edema in B LE.  Mr. Jepson will benefit from skilled PT for debridement and proper dressing to allow a healing environment.    PATIENT EDUCATION: Education details: Keep dressing dry, do not remove unless dressing becomes wet or painful  Person educated: Patient Education method: Explanation Education comprehension: verbalized understanding   HOME EXERCISE PROGRAM: Ankle pumps    GOALS: Goals reviewed with patient? No  SHORT TERM GOALS: Target date: 09/07/22  Pt wounds to be 100% granulated  Baseline: see above Goal status: IN PROGRESS  2.  Pt wounds to have decreased 50% in size  Baseline: see above Goal status: IN PROGRESS  LONG TERM GOALS: Target date: 12/28/22  Pt wounds to be healed to decrease risk of infection. Baseline:  Goal status: IN PROGRESS  2.  Pt to be using compression garments Baseline: 11/30/22:  Pt stated he plans to purchase later today Goal status: IN PROGRESS  3.  Pt to have obtained and be using a compression pump. Baseline:  Goal status: IN PROGRESS    ASSESSMENT:  CLINICAL IMPRESSION: Wounds with minimal changes in size.  Lt LE with increased drainage this session and continues to be hypergranulated.  Laser treatment completed to Rt only this session as increased drainage with Lt upon removal of dressings.  No debridement necessary Lt LE, just cleansed.  Selective debridement for removal of devitalized tissue from wound bed and perimeter of wound to promote healing.  Continued with profore for edema control.  Pt to be reassessed next session.  May need to refer back to MD as need alternative treatment if no changes.   OBJECTIVE IMPAIRMENTS: increased edema, pain, and decreased skin integrity  .   ACTIVITY LIMITATIONS: hygiene/grooming  PARTICIPATION LIMITATIONS: yard work  PERSONAL FACTORS: 3+ comorbidities: DM< CKD, CVI  are also affecting patient's functional outcome.   REHAB POTENTIAL: Fair    CLINICAL DECISION  MAKING: Evolving/moderate complexity  EVALUATION COMPLEXITY: Moderate  PLAN: PT FREQUENCY: 2x/week  PT DURATION: 6 weeks  PLANNED INTERVENTIONS: Patient/Family education, Self Care, and debridement and dressing   PLAN FOR NEXT SESSION: Continue with debridement and dressing change. Transition to compression stockings when wounds are healed (in wound bag).  F/U if any improvement with laser; continue to measure and photograph each week.  Re-evaluation in 1 more sessions.    Becky Sax, LPTA/CLT; CBIS [phone]  Juel Burrow, PTA 12/20/2022,  4:46 PM

## 2022-12-24 ENCOUNTER — Ambulatory Visit (HOSPITAL_COMMUNITY): Payer: Medicaid Other | Admitting: Physical Therapy

## 2022-12-24 DIAGNOSIS — S81802S Unspecified open wound, left lower leg, sequela: Secondary | ICD-10-CM | POA: Diagnosis not present

## 2022-12-24 DIAGNOSIS — M79661 Pain in right lower leg: Secondary | ICD-10-CM | POA: Diagnosis not present

## 2022-12-24 DIAGNOSIS — S81801S Unspecified open wound, right lower leg, sequela: Secondary | ICD-10-CM | POA: Diagnosis not present

## 2022-12-24 DIAGNOSIS — M79662 Pain in left lower leg: Secondary | ICD-10-CM | POA: Diagnosis not present

## 2022-12-24 NOTE — Therapy (Signed)
OUTPATIENT PHYSICAL THERAPY Wound Treatment Progress Note Reporting Period 11/15/2022 to 12/24/2022  See note below for Objective Data and Assessment of Progress/Goals.       Patient Name: [name] MRN: [id number] DOB:May 23, 1971, 52 y.o., male Today's Date: 12/24/2022   PCP: Roxine Caddy REFERRING PROVIDER: Burgess Amor, PA-C  END OF SESSION:  PT End of Session - 12/24/22 1537     Visit Number 12    Number of Visits 12    Date for PT Re-Evaluation 01/24/23    Authorization Type medicaid healthy blue approved no approval needed on debridement charge    Authorization Time Period no prior auth needed for coded [id number] and [id number] per [employer]    PT Start Time 1115    PT Stop Time 1155    PT Time Calculation (min) 40 min    Activity Tolerance Patient tolerated treatment well    Behavior During Therapy [facility] for tasks assessed/performed                Past Medical History:  Diagnosis Date   Acute blood loss as cause of postoperative anemia 04/18/2017   ARF (acute renal failure) (HCC)    Cellulitis of right lower leg 03/31/2017   Chronic anticoagulation 03/31/2017   Diabetes mellitus without complication (HCC)    Diabetic ulcer of calf (HCC) 03/31/2017   DVT of proximal lower limb (HCC) 03/20/2017   R calf area   Escherichia coli sepsis (HCC) 04/18/2017   Gout    History of hiatal hernia    Hypertension    Hypoalbuminemia 03/31/2017   Necrotizing fasciitis (HCC)    Right leg DVT (HCC) 03/31/2017   Sepsis (HCC) 03/30/2017   Sinus tachycardia 03/31/2017   Type 2 diabetes mellitus with stage 2 chronic kidney disease, with long-term current use of insulin (HCC) 03/27/2017   Past Surgical History:  Procedure Laterality Date   COLONOSCOPY WITH PROPOFOL N/A 02/10/2018   Procedure: COLONOSCOPY WITH PROPOFOL;  Surgeon: Toney Reil, MD;  Location: [facility] ENDOSCOPY;  Service: Gastroenterology;  Laterality: N/A;   ELBOW SURGERY Right    DUE TO INFECTION    ESOPHAGOGASTRODUODENOSCOPY (EGD) WITH PROPOFOL N/A 02/10/2018   Procedure: ESOPHAGOGASTRODUODENOSCOPY (EGD) WITH PROPOFOL;  Surgeon: Toney Reil, MD;  Location: [facility] ENDOSCOPY;  Service: Gastroenterology;  Laterality: N/A;   ESOPHAGOGASTRODUODENOSCOPY (EGD) WITH PROPOFOL N/A 04/14/2020   Procedure: ESOPHAGOGASTRODUODENOSCOPY (EGD) WITH PROPOFOL;  Surgeon: Toney Reil, MD;  Location: [facility];  Service: Endoscopy;  Laterality: N/A;  diabetic - insulin   I & D EXTREMITY Right 04/03/2017   Procedure: IRRIGATION AND DEBRIDEMENT RIGHT LEG, APPLY WOUND VAC;  Surgeon: Nadara Mustard, MD;  Location: [facility] OR;  Service: Orthopedics;  Laterality: Right;   I & D EXTREMITY Right 04/05/2017   Procedure: IRRIGATION AND DEBRIDEMENT RIGHT LEG;  Surgeon: Nadara Mustard, MD;  Location: [facility] OR;  Service: Orthopedics;  Laterality: Right;   IRRIGATION AND DEBRIDEMENT ABSCESS Right 12/30/2018   Procedure: MINOR INCISION AND DRAINAGE OF ABSCESS;  Surgeon: Ernest Mallick, MD;  Location: [facility] ORS;  Service: Orthopedics;  Laterality: Right;   SCROTAL SURGERY  2006   SKIN SPLIT GRAFT Right 04/10/2017   Procedure: SKIN GRAFT SPLIT THICKNESS RIGHT LEG;  Surgeon: Nadara Mustard, MD;  Location: [facility] OR;  Service: Orthopedics;  Laterality: Right;   XI ROBOTIC ASSISTED INGUINAL HERNIA REPAIR WITH MESH Bilateral 09/01/2020   Procedure: XI ROBOTIC ASSISTED INGUINAL HERNIA REPAIR WITH MESH;  Surgeon: Sung Amabile, DO;  Location: [facility]  ORS;  Service: General;  Laterality: Bilateral;   Patient Active Problem List   Diagnosis Date Noted   Acute CVA (cerebrovascular accident) (HCC) 11/26/2020   CKD (chronic kidney disease) 11/26/2020   Abdominal pain, chronic, epigastric    Arthralgia of right elbow 01/13/2019   Venous stasis ulcer (HCC) 12/31/2018   Chronic kidney disease (CKD) stage G2/A2, mildly decreased glomerular filtration rate (GFR) between 60-89 mL/min/1.73 square meter and albuminuria creatinine ratio  between 30-299 mg/g 12/31/2018   Septic arthritis of elbow, right (HCC) 12/30/2018   Chronic venous insufficiency 02/06/2018   Chronic anticoagulation 03/31/2017   Type 2 diabetes mellitus with stage 2 chronic kidney disease, with long-term current use of insulin (HCC) 03/27/2017   History of DVT (deep vein thrombosis) 03/27/2017   Essential hypertension 01/28/2008    ONSET DATE: acute wound 09/04/22; chronic 2018  REFERRING DIAG: non healing CVI wound Rt LE; non healing CVA wound Lt LE  THERAPY DIAG:  Non healing Rt LE wound Non healing Lt LE wound   Rationale for Evaluation and Treatment: Rehabilitation     Wound Therapy - 12/24/22 1535     Subjective Dressings intact, reports of comfort    Patient and Family Stated Goals wounds to heal    Date of Onset --   Rt 2018; Lt March 2024   Pain Scale 0-10    Pain Score 0-No pain    Evaluation and Treatment Procedures Explained to Patient/Family Yes    Evaluation and Treatment Procedures agreed to    Wound Properties Date First Assessed: 11/15/22 Time First Assessed: 1039 Wound Type: Diabetic ulcer Location: Leg Location Orientation: Right;Lateral Present on Admission: Yes   Dressing Type Silver hydrofiber;Compression wrap;Abdominal pads    Dressing Changed Changed    Dressing Status Old drainage    Dressing Change Frequency PRN    Site / Wound Assessment Painful;Dusky;Friable    Peri-wound Assessment Edema    Wound Length (cm) 5.2 cm    Wound Width (cm) 2.3 cm    Wound Depth (cm) 0.2 cm    Wound Volume (cm^3) 2.39 cm^3    Wound Surface Area (cm^2) 11.96 cm^2    Drainage Amount Moderate    Drainage Description Serosanguineous;Odor - foul    Treatment Cleansed    Wound Properties Date First Assessed: 11/15/22 Time First Assessed: 1040 Wound Type: Skin tear Location: Leg Location Orientation: Left;Proximal;Posterior Present on Admission: Yes   Dressing Type Silver hydrofiber;Gauze (Comment);Compression wrap    Dressing Changed  Changed    Dressing Status Old drainage    Dressing Change Frequency PRN    Site / Wound Assessment Granulation tissue    % Wound base Red or Granulating 100%   hypergranulation   % Wound base Yellow/Fibrinous Exudate 0%    Peri-wound Assessment Intact   hypergranulated   Wound Length (cm) 2.2 cm    Wound Width (cm) 2.2 cm    Wound Surface Area (cm^2) 4.84 cm^2    Drainage Amount Moderate    Drainage Description Serosanguineous    Treatment Cleansed    Selective Debridement (non-excisional) - Location edges of wound    Selective Debridement (non-excisional) - Tools Used Forceps;Scalpel;Scissors    Selective Debridement (non-excisional) - Tissue Removed devitalized tissue, biofilm    Wound Therapy - Clinical Statement see below    Wound Therapy - Functional Problem List dressing, bathing    Factors Delaying/Impairing Wound Healing Diabetes Mellitus;Altered sensation;Multiple medical problems;Polypharmacy;Vascular compromise    Hydrotherapy Plan Debridement;Dressing change;Patient/family education   added  laser   Wound Therapy - Frequency 2X / week    Wound Therapy - Current Recommendations PT    Wound Plan cleanse, moisturize, debridement and compression dressing    Dressing  Rt: silver hydrofiber, 4X4, ABD, profore wrap; #5 netting bil LE's    Dressing Lt: silver hydrofiber, 1/2" foam, profore, netting #5               [name] is a 53 y.o. male with a history including type 2 diabetes, chronic kidney disease, venous insufficiency, history of DVT and chronic venous stasis ulcer right lateral calf presenting for evaluation of a new wound on his left calf.  He states approximately a month ago he noticed a small "scratch" in his posterior right calf, thought it might have been a bug bite, over the past month it has expanded and yesterday the top layer sloughed off and now it is a ulcer.  Noted edema in B LE.  Mr. Tuomi will benefit from skilled PT for debridement and  proper dressing to allow a healing environment.    PATIENT EDUCATION: Education details: Keep dressing dry, do not remove unless dressing becomes wet or painful  Person educated: Patient Education method: Explanation Education comprehension: verbalized understanding   HOME EXERCISE PROGRAM: Ankle pumps    GOALS: Goals reviewed with patient? No  SHORT TERM GOALS: Target date: 09/07/22  Pt wounds to be 100% granulated  Baseline: see above Goal status: IN PROGRESS  2.  Pt wounds to have decreased 50% in size  Baseline: see above Goal status: IN PROGRESS  LONG TERM GOALS: Target date: 12/28/22  Pt wounds to be healed to decrease risk of infection. Baseline:  Goal status: IN PROGRESS  2.  Pt to be using compression garments Baseline: 11/30/22:  Pt stated he plans to purchase later today Goal status: IN PROGRESS  3.  Pt to have obtained and be using a compression pump. Baseline:  Goal status: IN PROGRESS    ASSESSMENT:  CLINICAL IMPRESSION: Wounds  remeasured this session with no change.  Rt LE wound has slight odor and Lt continues to be hyper granulated and friable.  Instructed pt to return to MD for additional opinion or referral to additional sources that may be able to help with healing wounds.  Pt verbalized understanding.  Instructed to continue to come to therapy in order to maintain progress and keep check on wound and appropriate dressings.  Discontinued laser treatment as does not appear to be affective. Completed cleansing and debridement of perimeter of wound to promote healing.  Continued with profore for edema control.    Patient wound has not changed and PTA instructed patient to return to MD. Will continue to see patient to maintain progress and change dressings to promote wound healing. Patient has not met goals at this time. Extending POC 2x/week for 4 weeks to promote wound healing.  8:19 AM, 12/25/22 Wyman Songster PT, DPT Physical Therapist at [facility]   OBJECTIVE IMPAIRMENTS: increased edema, pain, and decreased skin integrity  .   ACTIVITY LIMITATIONS: hygiene/grooming  PARTICIPATION LIMITATIONS: yard work  PERSONAL FACTORS: 3+ comorbidities: DM< CKD, CVI  are also affecting patient's functional outcome.   REHAB POTENTIAL: Fair    CLINICAL DECISION MAKING: Evolving/moderate complexity  EVALUATION COMPLEXITY: Moderate  PLAN: PT FREQUENCY: 2x/week  PT DURATION: 4 weeks  PLANNED INTERVENTIONS: Patient/Family education, Self Care, and debridement and dressing   PLAN FOR NEXT SESSION: Refer back to MD  as need alternative treatment if no changes.   Emeline Gins B, PTA 12/24/2022, 4:40 PM

## 2022-12-25 NOTE — Addendum Note (Signed)
Addended by: Wyman Songster on: 12/25/2022 08:21 AM   Modules accepted: Orders

## 2022-12-27 ENCOUNTER — Ambulatory Visit (HOSPITAL_COMMUNITY): Payer: Medicaid Other | Admitting: Physical Therapy

## 2022-12-27 DIAGNOSIS — S81801S Unspecified open wound, right lower leg, sequela: Secondary | ICD-10-CM

## 2022-12-27 DIAGNOSIS — S81802S Unspecified open wound, left lower leg, sequela: Secondary | ICD-10-CM | POA: Diagnosis not present

## 2022-12-27 DIAGNOSIS — M79662 Pain in left lower leg: Secondary | ICD-10-CM | POA: Diagnosis not present

## 2022-12-27 DIAGNOSIS — M79661 Pain in right lower leg: Secondary | ICD-10-CM

## 2022-12-27 NOTE — Therapy (Signed)
OUTPATIENT PHYSICAL THERAPY Wound Treatment  Patient Name: [name] MRN: [id number] DOB:March 29, 1971, 52 y.o., male Today's Date: 12/27/2022   PCP: Roxine Caddy REFERRING PROVIDER: Burgess Amor, PA-C  END OF SESSION:  PT End of Session - 12/27/22 1451     Visit Number 13    Number of Visits 20    Date for PT Re-Evaluation 01/24/23    Authorization Type medicaid healthy blue approved no approval needed on debridement charge    Authorization Time Period no prior auth needed for coded [id number] and [id number] per [employer]    PT Start Time 1117    PT Stop Time 1202    PT Time Calculation (min) 45 min    Activity Tolerance Patient tolerated treatment well    Behavior During Therapy [facility] for tasks assessed/performed                 Past Medical History:  Diagnosis Date   Acute blood loss as cause of postoperative anemia 04/18/2017   ARF (acute renal failure) (HCC)    Cellulitis of right lower leg 03/31/2017   Chronic anticoagulation 03/31/2017   Diabetes mellitus without complication (HCC)    Diabetic ulcer of calf (HCC) 03/31/2017   DVT of proximal lower limb (HCC) 03/20/2017   R calf area   Escherichia coli sepsis (HCC) 04/18/2017   Gout    History of hiatal hernia    Hypertension    Hypoalbuminemia 03/31/2017   Necrotizing fasciitis (HCC)    Right leg DVT (HCC) 03/31/2017   Sepsis (HCC) 03/30/2017   Sinus tachycardia 03/31/2017   Type 2 diabetes mellitus with stage 2 chronic kidney disease, with long-term current use of insulin (HCC) 03/27/2017   Past Surgical History:  Procedure Laterality Date   COLONOSCOPY WITH PROPOFOL N/A 02/10/2018   Procedure: COLONOSCOPY WITH PROPOFOL;  Surgeon: Toney Reil, MD;  Location: [facility] ENDOSCOPY;  Service: Gastroenterology;  Laterality: N/A;   ELBOW SURGERY Right    DUE TO INFECTION   ESOPHAGOGASTRODUODENOSCOPY (EGD) WITH PROPOFOL N/A 02/10/2018   Procedure: ESOPHAGOGASTRODUODENOSCOPY (EGD) WITH PROPOFOL;  Surgeon: Toney Reil, MD;  Location: [facility] ENDOSCOPY;  Service: Gastroenterology;  Laterality: N/A;   ESOPHAGOGASTRODUODENOSCOPY (EGD) WITH PROPOFOL N/A 04/14/2020   Procedure: ESOPHAGOGASTRODUODENOSCOPY (EGD) WITH PROPOFOL;  Surgeon: Toney Reil, MD;  Location: [facility];  Service: Endoscopy;  Laterality: N/A;  diabetic - insulin   I & D EXTREMITY Right 04/03/2017   Procedure: IRRIGATION AND DEBRIDEMENT RIGHT LEG, APPLY WOUND VAC;  Surgeon: Nadara Mustard, MD;  Location: [facility] OR;  Service: Orthopedics;  Laterality: Right;   I & D EXTREMITY Right 04/05/2017   Procedure: IRRIGATION AND DEBRIDEMENT RIGHT LEG;  Surgeon: Nadara Mustard, MD;  Location: [facility] OR;  Service: Orthopedics;  Laterality: Right;   IRRIGATION AND DEBRIDEMENT ABSCESS Right 12/30/2018   Procedure: MINOR INCISION AND DRAINAGE OF ABSCESS;  Surgeon: Ernest Mallick, MD;  Location: [facility] ORS;  Service: Orthopedics;  Laterality: Right;   SCROTAL SURGERY  2006   SKIN SPLIT GRAFT Right 04/10/2017   Procedure: SKIN GRAFT SPLIT THICKNESS RIGHT LEG;  Surgeon: Nadara Mustard, MD;  Location: [facility] OR;  Service: Orthopedics;  Laterality: Right;   XI ROBOTIC ASSISTED INGUINAL HERNIA REPAIR WITH MESH Bilateral 09/01/2020   Procedure: XI ROBOTIC ASSISTED INGUINAL HERNIA REPAIR WITH MESH;  Surgeon: Sung Amabile, DO;  Location: [facility] ORS;  Service: General;  Laterality: Bilateral;   Patient Active Problem List   Diagnosis Date Noted   Acute CVA (  cerebrovascular accident) (HCC) 11/26/2020   CKD (chronic kidney disease) 11/26/2020   Abdominal pain, chronic, epigastric    Arthralgia of right elbow 01/13/2019   Venous stasis ulcer (HCC) 12/31/2018   Chronic kidney disease (CKD) stage G2/A2, mildly decreased glomerular filtration rate (GFR) between 60-89 mL/min/1.73 square meter and albuminuria creatinine ratio between 30-299 mg/g 12/31/2018   Septic arthritis of elbow, right (HCC) 12/30/2018   Chronic venous insufficiency 02/06/2018   Chronic  anticoagulation 03/31/2017   Type 2 diabetes mellitus with stage 2 chronic kidney disease, with long-term current use of insulin (HCC) 03/27/2017   History of DVT (deep vein thrombosis) 03/27/2017   Essential hypertension 01/28/2008    ONSET DATE: acute wound 09/04/22; chronic 2018  REFERRING DIAG: non healing CVI wound Rt LE; non healing CVA wound Lt LE  THERAPY DIAG:  Non healing Rt LE wound Non healing Lt LE wound   Rationale for Evaluation and Treatment: Rehabilitation     Wound Therapy - 12/27/22 1347     Subjective pt reprots he is going to MD tomorrow.  No issues.  Dressings intact.    Patient and Family Stated Goals wounds to heal    Date of Onset --   Rt 2018; Lt March 2024   Pain Scale 0-10    Pain Score 0-No pain    Evaluation and Treatment Procedures Explained to Patient/Family Yes    Evaluation and Treatment Procedures agreed to    Wound Properties Date First Assessed: 11/15/22 Time First Assessed: 1039 Wound Type: Diabetic ulcer Location: Leg Location Orientation: Right;Lateral Present on Admission: Yes   Wound Image Images linked: 1    Dressing Type Silver hydrofiber;Compression wrap;Abdominal pads    Dressing Status Old drainage    Dressing Change Frequency PRN    Site / Wound Assessment Painful;Dusky;Friable    % Wound base Red or Granulating 95%    % Wound base Yellow/Fibrinous Exudate 5%    Peri-wound Assessment Edema    Wound Length (cm) 5.2 cm    Wound Width (cm) 2.1 cm    Wound Depth (cm) 0.2 cm   at most proximal end   Wound Volume (cm^3) 2.18 cm^3    Wound Surface Area (cm^2) 10.92 cm^2    Drainage Amount Moderate    Drainage Description Serosanguineous    Treatment Cleansed;Debridement (Selective)    Wound Properties Date First Assessed: 11/15/22 Time First Assessed: 1040 Wound Type: Skin tear Location: Leg Location Orientation: Left;Proximal;Posterior Present on Admission: Yes   Wound Image Images linked: 1    Dressing Type Silver hydrofiber;Gauze  (Comment);Compression wrap    Dressing Changed Changed    Dressing Status Old drainage    Dressing Change Frequency PRN    Site / Wound Assessment Granulation tissue    % Wound base Red or Granulating 100%    % Wound base Yellow/Fibrinous Exudate 0%    % Wound base Black/Eschar 0%    Peri-wound Assessment Intact   hypergranulated   Wound Length (cm) 2.2 cm    Wound Width (cm) 2.2 cm    Wound Surface Area (cm^2) 4.84 cm^2    Drainage Amount Moderate    Drainage Description Sanguineous;Serosanguineous    Treatment Cleansed    Selective Debridement (non-excisional) - Location edges of wound    Selective Debridement (non-excisional) - Tools Used Forceps;Scalpel;Scissors    Selective Debridement (non-excisional) - Tissue Removed devitalized tissue, biofilm    Wound Therapy - Clinical Statement see below    Wound Therapy - Functional Problem List  dressing, bathing    Factors Delaying/Impairing Wound Healing Diabetes Mellitus;Altered sensation;Multiple medical problems;Polypharmacy;Vascular compromise    Hydrotherapy Plan Debridement;Dressing change;Patient/family education   added laser   Wound Therapy - Frequency 2X / week    Wound Therapy - Current Recommendations PT    Wound Plan cleanse, moisturize, debridement and compression dressing    Dressing  Rt: silver hydrofiber, 4X4, ABD, medipore and compression stocking    Dressing Lt: silver hydrofiber, 1/2" ABD, compression stocking               [name] is a 52 y.o. male with a history including type 2 diabetes, chronic kidney disease, venous insufficiency, history of DVT and chronic venous stasis ulcer right lateral calf presenting for evaluation of a new wound on his left calf.  He states approximately a month ago he noticed a small "scratch" in his posterior right calf, thought it might have been a bug bite, over the past month it has expanded and yesterday the top layer sloughed off and now it is a ulcer.  Noted edema in  B LE.  Mr. Bierl will benefit from skilled PT for debridement and proper dressing to allow a healing environment.    PATIENT EDUCATION: Education details: Keep dressing dry, do not remove unless dressing becomes wet or painful  Person educated: Patient Education method: Explanation Education comprehension: verbalized understanding   HOME EXERCISE PROGRAM: Ankle pumps    GOALS: Goals reviewed with patient? No  SHORT TERM GOALS: Target date: 09/07/22  Pt wounds to be 100% granulated  Baseline: see above Goal status: IN PROGRESS  2.  Pt wounds to have decreased 50% in size  Baseline: see above Goal status: IN PROGRESS  LONG TERM GOALS: Target date: 12/28/22  Pt wounds to be healed to decrease risk of infection. Baseline:  Goal status: IN PROGRESS  2.  Pt to be using compression garments Baseline: 11/30/22:  Pt stated he plans to purchase later today Goal status: IN PROGRESS  3.  Pt to have obtained and be using a compression pump. Baseline:  Goal status: IN PROGRESS    ASSESSMENT:  CLINICAL IMPRESSION: Wounds  photographed and remeasured with no change Lt and slight reduction in width for Rt LE.  Rt LE continues with a slight odor and Lt friable with hypergranuated wound bed.  Pt is returning to MD regarding lack in progress.  Able to debride away callous and dry borders around Rt LE wound with no debridement needed for Lt.  Continued with silver hydrofiber but changed to simple tape dressing as he is returning to MD tomorrow to remove dressing.  Instructed how to don/doff knee high garments and carefully place over dressing.  Pt reported good fit and comfort with compression.  Will continue to see patient to maintain progress to debride wound and change dressings to promote healing.   OBJECTIVE IMPAIRMENTS: increased edema, pain, and decreased skin integrity  .   ACTIVITY LIMITATIONS: hygiene/grooming  PARTICIPATION LIMITATIONS: yard work  PERSONAL FACTORS: 3+  comorbidities: DM< CKD, CVI  are also affecting patient's functional outcome.   REHAB POTENTIAL: Fair    CLINICAL DECISION MAKING: Evolving/moderate complexity  EVALUATION COMPLEXITY: Moderate  PLAN: PT FREQUENCY: 2x/week  PT DURATION: 4 weeks  PLANNED INTERVENTIONS: Patient/Family education, Self Care, and debridement and dressing   PLAN FOR NEXT SESSION: Await MD visit tomorrow for new orders vs continuation/discharge.  Lurena Nida, PTA/CLT [facility] Ph: [phone]  Lurena Nida,  PTA 12/27/2022, 2:53 PM

## 2022-12-28 ENCOUNTER — Emergency Department (HOSPITAL_COMMUNITY): Payer: Medicaid Other

## 2022-12-28 ENCOUNTER — Encounter (HOSPITAL_COMMUNITY): Payer: Self-pay

## 2022-12-28 ENCOUNTER — Other Ambulatory Visit: Payer: Self-pay

## 2022-12-28 ENCOUNTER — Emergency Department (HOSPITAL_COMMUNITY)
Admission: EM | Admit: 2022-12-28 | Discharge: 2022-12-28 | Disposition: A | Discharge status: Home / Self Care | Payer: Medicaid Other | Attending: Emergency Medicine | Admitting: Emergency Medicine

## 2022-12-28 DIAGNOSIS — S81801S Unspecified open wound, right lower leg, sequela: Secondary | ICD-10-CM

## 2022-12-28 DIAGNOSIS — E119 Type 2 diabetes mellitus without complications: Secondary | ICD-10-CM | POA: Diagnosis not present

## 2022-12-28 DIAGNOSIS — S81809D Unspecified open wound, unspecified lower leg, subsequent encounter: Secondary | ICD-10-CM | POA: Diagnosis present

## 2022-12-28 DIAGNOSIS — Z794 Long term (current) use of insulin: Secondary | ICD-10-CM | POA: Insufficient documentation

## 2022-12-28 DIAGNOSIS — X58XXXA Exposure to other specified factors, initial encounter: Secondary | ICD-10-CM | POA: Diagnosis not present

## 2022-12-28 DIAGNOSIS — S81802D Unspecified open wound, left lower leg, subsequent encounter: Secondary | ICD-10-CM | POA: Insufficient documentation

## 2022-12-28 LAB — BASIC METABOLIC PANEL
Anion gap: 9 (ref 5–15)
BUN: 25 mg/dL — ABNORMAL HIGH (ref 6–20)
CO2: 27 mmol/L (ref 22–32)
Calcium: 9.1 mg/dL (ref 8.9–10.3)
Chloride: 102 mmol/L (ref 98–111)
Creatinine, Ser: 1.5 mg/dL — ABNORMAL HIGH (ref 0.61–1.24)
GFR, Estimated: 56 mL/min — ABNORMAL LOW (ref >60)
Glucose, Bld: 88 mg/dL (ref 70–99)
Potassium: 4.1 mmol/L (ref 3.5–5.1)
Sodium: 138 mmol/L (ref 135–145)

## 2022-12-28 LAB — CBC
HCT: 46.7 % (ref 39.0–52.0)
Hemoglobin: 14.8 g/dL (ref 13.0–17.0)
MCH: 29.3 pg (ref 26.0–34.0)
MCHC: 31.7 g/dL (ref 30.0–36.0)
MCV: 92.5 fL (ref 80.0–100.0)
Platelets: 225 10*3/uL (ref 150–400)
RBC: 5.05 MIL/uL (ref 4.22–5.81)
RDW: 13.4 % (ref 11.5–15.5)
WBC: 4.3 10*3/uL (ref 4.0–10.5)
nRBC: 0 % (ref 0.0–0.2)

## 2022-12-28 NOTE — ED Triage Notes (Signed)
Pt doing rehab and facility said his wounds are not "shrinking" and advised to come to ED for re-evaluation. Wound on right leg been there almost 6 yrs per pt. And wound on left leg since before Easter 2024. Pt is diabetic.

## 2022-12-28 NOTE — Discharge Instructions (Addendum)
Evaluation of your wounds in your legs suggest that they are stable and not infected at this time.  X-ray of the left leg revealed some improvement.  Nevertheless feel that you should follow-up with wound care in [place].  I provided their information in your discharge summary.  If you have new fever, leg pain, wound oozing, swelling or redness or warmth around the wounds or any other concerning symptom please return emergency department further evaluation.

## 2022-12-28 NOTE — ED Provider Notes (Signed)
[place] EMERGENCY DEPARTMENT AT [facility] Provider Note   CSN: [id number] Arrival date & time: 12/28/22  4098     History  Chief Complaint  Patient presents with   Wound Check   HPI [name] is a 52 y.o. male with type 2 diabetes, history of DVT presenting for wounds on both legs.  Has a wound on the right lateral aspect of the lower right leg that he states has been there for 6 years.  The other wound is on the posterior side of the lower left leg which he states has been there since Easter.  He is being managed and evaluated by rehab clinic where he is wounds are treated twice a week.  States that overall he feels that they are not healing.  Denies tenderness, warmth, swelling, or redness around the wounds.   Wound Check       Home Medications Prior to Admission medications   Medication Sig Start Date End Date Taking? Authorizing Provider  ACCU-CHEK FASTCLIX LANCETS MISC U UTD TO CHECK BLOOD SUGAR TID 01/13/18   [provider]  ACCU-CHEK GUIDE test strip USE TO TEST QID UTD 12/13/17   [provider]  acetaminophen (TYLENOL) 325 MG tablet Take 2 tablets (650 mg total) by mouth every 6 (six) hours as needed for up to 30 doses. 09/28/21   Terald Sleeper, MD  amLODipine (NORVASC) 10 MG tablet Take 10 mg by mouth every morning.    [provider]  colchicine 0.6 MG tablet Take 1 tablet (0.6 mg total) by mouth daily. 09/28/21   Tilden Fossa, MD  doxycycline (VIBRAMYCIN) 100 MG capsule Take 1 capsule (100 mg total) by mouth 2 (two) times daily. 11/11/22   Burgess Amor, PA-C  glucose blood test strip Use 4 (four) times daily Use as instructed. 11/13/17   [provider]  hydrALAZINE (APRESOLINE) 100 MG tablet Take 100 mg by mouth 3 (three) times daily. 12/25/19   [provider]  hydrochlorothiazide (HYDRODIURIL) 25 MG tablet Take 25 mg by mouth every morning. 05/08/18   [provider]  ibuprofen (ADVIL) 800  MG tablet Take 1 tablet (800 mg total) by mouth every 8 (eight) hours as needed for mild pain or moderate pain. 09/01/20   Tonna Boehringer, Isami, DO  insulin aspart (NOVOLOG) 100 UNIT/ML FlexPen Inject 10-15 Units into the skin 4 (four) times daily. Sliding scale    [provider]  lisinopril (PRINIVIL,ZESTRIL) 40 MG tablet Take 40 mg by mouth every morning. 05/09/18   [provider]  metoprolol tartrate (LOPRESSOR) 100 MG tablet Take 1 tablet (100 mg total) by mouth 2 (two) times daily. 04/12/17   Kathlen Mody, MD  oxyCODONE (ROXICODONE) 5 MG immediate release tablet Take 1 tablet (5 mg total) by mouth every 6 (six) hours as needed for up to 15 doses for severe pain. 09/28/21   Terald Sleeper, MD      Allergies    Patient has no known allergies.    Review of Systems   See HPI pertinent positives   Physical Exam   Vitals:   12/28/22 0916  BP: 134/85  Pulse: 69  Resp: 18  Temp: 98.2 F (36.8 C)  SpO2: 96%    CONSTITUTIONAL:  well-appearing, NAD NEURO:  Alert and oriented x 3, CN 3-12 grossly intact EYES:  eyes equal and reactive ENT/NECK:  Supple, no stridor  CARDIO: regular rate and rhythm, appears well-perfused  PULM:  No respiratory distress, CTAB GI/GU:  non-distended, soft MSK/SPINE:  No gross deformities, no edema, moves all extremities SKIN:  wounds on lower legs. See pictures below:  Left leg wound:   Right leg wound:    *Additional and/or pertinent findings included in MDM below  ED Results / Procedures / Treatments   Labs (all labs ordered are listed, but only abnormal results are displayed) Labs Reviewed  BASIC METABOLIC PANEL - Abnormal; Notable for the following components:      Result Value   BUN 25 (*)    Creatinine, Ser 1.50 (*)    GFR, Estimated 56 (*)    All other components within normal limits  CBC    EKG None  Radiology DG Tibia/Fibula Right  Result Date: 12/28/2022 CLINICAL DATA:  Wound. EXAM: RIGHT TIBIA AND FIBULA - 2  VIEW COMPARISON:  X-ray 06/30/2022 FINDINGS: No fracture or dislocation. Preserved adjacent joint spaces. Preserved bone mineralization. Multiple skin staples again seen laterally about the distal aspect of the lower leg. Vascular calcifications are seen. No underlying erosive changes. IMPRESSION: No acute osseous abnormality. Persistent skin staples and vascular calcifications. Electronically Signed   By: Karen Kays M.D.   On: 12/28/2022 11:45   DG Tibia/Fibula Left  Result Date: 12/28/2022 CLINICAL DATA:  Wounds. EXAM: LEFT TIBIA AND FIBULA - 2 VIEW COMPARISON:  11/11/2022 x-ray series FINDINGS: No fracture or dislocation. Preserved joint spaces and bone mineralization. No erosive changes. Vascular calcifications are seen. The area of previous soft tissue defect posterior along the mid to lower leg is less apparent today. Smaller defect. IMPRESSION: No acute osseous abnormality. Soft tissue defect posteriorly seen on the prior is slightly improved today overall by x-ray. Please correlate clinical findings. Electronically Signed   By: Karen Kays M.D.   On: 12/28/2022 11:44    Procedures Procedures    Medications Ordered in ED Medications - No data to display  ED Course/ Medical Decision Making/ A&P                             Medical Decision Making Amount and/or Complexity of Data Reviewed Labs: ordered. Radiology: ordered.   52 year old well-appearing male presenting for wound reevaluation.  Exam notable for wounds in the left and right lower legs.  See pictures above.  Overall, the wounds do not appear infected.  X-rays did not reveal any acute osseous abnormality to suggest osteomyelitis.  X-ray of the left actually showed possible improvement in comparison to last.  However given the persistence of his wounds suggest that wound healing is poor.  Labs also did not reveal any acute abnormality.  Advised him to follow-up with wound care in [facility] for management and reassessment.   Discussed pertinent return precautions.  Vital stable at discharge.         Final Clinical Impression(s) / ED Diagnoses Final diagnoses:  Wound of left lower extremity, subsequent encounter  Leg wound, right, sequela    Rx / DC Orders ED Discharge Orders     None         Gareth Eagle, PA-C 12/28/22 1209    Derwood Kaplan, MD 12/29/22 1238

## 2023-01-04 ENCOUNTER — Ambulatory Visit (HOSPITAL_COMMUNITY): Payer: Medicaid Other | Admitting: Physical Therapy

## 2023-01-07 ENCOUNTER — Ambulatory Visit (HOSPITAL_COMMUNITY): Payer: Medicaid Other

## 2023-01-07 ENCOUNTER — Telehealth (HOSPITAL_COMMUNITY): Payer: Self-pay

## 2023-01-07 NOTE — Telephone Encounter (Signed)
No show #2, no answer and mailbox full, unable to leave message regarding missed apt today. Per no show policy, cancelled all remaining apts except for next one.   Becky Sax, LPTA/CLT; [place] [phone]

## 2023-01-09 ENCOUNTER — Telehealth (HOSPITAL_COMMUNITY): Payer: Self-pay

## 2023-01-09 NOTE — Telephone Encounter (Signed)
Attempted to call concerning transition to wound center. No answer and mail box is full.   Becky Sax, LPTA/CLT; [place] [phone]

## 2023-01-09 NOTE — Telephone Encounter (Signed)
Talked to pt about continuing wound care at [facility] until he begins at the wound center 01/25/23. Pt received lymphedema pump from [facility] earlier today. Discussed proper care with new pump.   Becky Sax, LPTA/CLT; [place] [phone]

## 2023-01-10 ENCOUNTER — Encounter (HOSPITAL_COMMUNITY): Payer: Self-pay

## 2023-01-10 ENCOUNTER — Telehealth (HOSPITAL_COMMUNITY): Payer: Self-pay | Admitting: Physical Therapy

## 2023-01-10 ENCOUNTER — Ambulatory Visit (HOSPITAL_COMMUNITY): Payer: Medicaid Other

## 2023-01-10 DIAGNOSIS — M79662 Pain in left lower leg: Secondary | ICD-10-CM

## 2023-01-10 DIAGNOSIS — M79661 Pain in right lower leg: Secondary | ICD-10-CM | POA: Diagnosis not present

## 2023-01-10 DIAGNOSIS — S81801S Unspecified open wound, right lower leg, sequela: Secondary | ICD-10-CM

## 2023-01-10 DIAGNOSIS — S81802S Unspecified open wound, left lower leg, sequela: Secondary | ICD-10-CM

## 2023-01-10 NOTE — Therapy (Signed)
OUTPATIENT PHYSICAL THERAPY Wound Treatment  Patient Name: [name] MRN: [id number] DOB:06-May-1971, 52 y.o., male Today's Date: 01/10/2023   PCP: Roxine Caddy REFERRING PROVIDER: Burgess Amor, PA-C  END OF SESSION:  PT End of Session - 01/10/23 1000     Visit Number 14    Number of Visits 20    Date for PT Re-Evaluation 01/24/23    Authorization - Number of Visits 6    Progress Note Due on Visit 12    PT Start Time 1005    PT Stop Time 1043    PT Time Calculation (min) 38 min    Activity Tolerance Patient tolerated treatment well    Behavior During Therapy [facility] for tasks assessed/performed                 Past Medical History:  Diagnosis Date   Acute blood loss as cause of postoperative anemia 04/18/2017   ARF (acute renal failure) (HCC)    Cellulitis of right lower leg 03/31/2017   Chronic anticoagulation 03/31/2017   Diabetes mellitus without complication (HCC)    Diabetic ulcer of calf (HCC) 03/31/2017   DVT of proximal lower limb (HCC) 03/20/2017   R calf area   Escherichia coli sepsis (HCC) 04/18/2017   Gout    History of hiatal hernia    Hypertension    Hypoalbuminemia 03/31/2017   Necrotizing fasciitis (HCC)    Right leg DVT (HCC) 03/31/2017   Sepsis (HCC) 03/30/2017   Sinus tachycardia 03/31/2017   Type 2 diabetes mellitus with stage 2 chronic kidney disease, with long-term current use of insulin (HCC) 03/27/2017   Past Surgical History:  Procedure Laterality Date   COLONOSCOPY WITH PROPOFOL N/A 02/10/2018   Procedure: COLONOSCOPY WITH PROPOFOL;  Surgeon: Toney Reil, MD;  Location: [facility] ENDOSCOPY;  Service: Gastroenterology;  Laterality: N/A;   ELBOW SURGERY Right    DUE TO INFECTION   ESOPHAGOGASTRODUODENOSCOPY (EGD) WITH PROPOFOL N/A 02/10/2018   Procedure: ESOPHAGOGASTRODUODENOSCOPY (EGD) WITH PROPOFOL;  Surgeon: Toney Reil, MD;  Location: [facility] ENDOSCOPY;  Service: Gastroenterology;  Laterality: N/A;    ESOPHAGOGASTRODUODENOSCOPY (EGD) WITH PROPOFOL N/A 04/14/2020   Procedure: ESOPHAGOGASTRODUODENOSCOPY (EGD) WITH PROPOFOL;  Surgeon: Toney Reil, MD;  Location: [facility] SURGERY CNTR;  Service: Endoscopy;  Laterality: N/A;  diabetic - insulin   I & D EXTREMITY Right 04/03/2017   Procedure: IRRIGATION AND DEBRIDEMENT RIGHT LEG, APPLY WOUND VAC;  Surgeon: Nadara Mustard, MD;  Location: [facility] OR;  Service: Orthopedics;  Laterality: Right;   I & D EXTREMITY Right 04/05/2017   Procedure: IRRIGATION AND DEBRIDEMENT RIGHT LEG;  Surgeon: Nadara Mustard, MD;  Location: [facility] OR;  Service: Orthopedics;  Laterality: Right;   IRRIGATION AND DEBRIDEMENT ABSCESS Right 12/30/2018   Procedure: MINOR INCISION AND DRAINAGE OF ABSCESS;  Surgeon: Ernest Mallick, MD;  Location: [facility] ORS;  Service: Orthopedics;  Laterality: Right;   SCROTAL SURGERY  2006   SKIN SPLIT GRAFT Right 04/10/2017   Procedure: SKIN GRAFT SPLIT THICKNESS RIGHT LEG;  Surgeon: Nadara Mustard, MD;  Location: [facility] OR;  Service: Orthopedics;  Laterality: Right;   XI ROBOTIC ASSISTED INGUINAL HERNIA REPAIR WITH MESH Bilateral 09/01/2020   Procedure: XI ROBOTIC ASSISTED INGUINAL HERNIA REPAIR WITH MESH;  Surgeon: Sung Amabile, DO;  Location: [facility] ORS;  Service: General;  Laterality: Bilateral;   Patient Active Problem List   Diagnosis Date Noted   Acute CVA (cerebrovascular accident) (HCC) 11/26/2020   CKD (chronic kidney disease) 11/26/2020   Abdominal  pain, chronic, epigastric    Arthralgia of right elbow 01/13/2019   Venous stasis ulcer (HCC) 12/31/2018   Chronic kidney disease (CKD) stage G2/A2, mildly decreased glomerular filtration rate (GFR) between 60-89 mL/min/1.73 square meter and albuminuria creatinine ratio between 30-299 mg/g 12/31/2018   Septic arthritis of elbow, right (HCC) 12/30/2018   Chronic venous insufficiency 02/06/2018   Chronic anticoagulation 03/31/2017   Type 2 diabetes mellitus with stage 2 chronic kidney disease, with  long-term current use of insulin (HCC) 03/27/2017   History of DVT (deep vein thrombosis) 03/27/2017   Essential hypertension 01/28/2008    ONSET DATE: acute wound 09/04/22; chronic 2018  REFERRING DIAG: non healing CVI wound Rt LE; non healing CVA wound Lt LE  THERAPY DIAG:  Non healing Rt LE wound Non healing Lt LE wound   Rationale for Evaluation and Treatment: Rehabilitation     Wound Therapy - 01/10/23 0001     Subjective Pt arrived with tape covering wounds, stated he changed it Saturday.  Stated pump arrived yesterday.    Patient and Family Stated Goals wounds to heal    Date of Onset --   Rt 2018; Lt March 2024   Pain Scale 0-10    Pain Score 0-No pain    Evaluation and Treatment Procedures Explained to Patient/Family Yes    Evaluation and Treatment Procedures agreed to    Wound Properties Date First Assessed: 11/15/22 Time First Assessed: 1039 Wound Type: Diabetic ulcer Location: Leg Location Orientation: Right;Lateral Present on Admission: Yes   Wound Image Images linked: 1    Dressing Type Silver hydrofiber;Compression wrap;Abdominal pads    Dressing Changed Changed    Dressing Status Old drainage    Dressing Change Frequency PRN    Site / Wound Assessment Dusky;Friable    % Wound base Red or Granulating 95%    % Wound base Yellow/Fibrinous Exudate 5%    Peri-wound Assessment Edema    Wound Length (cm) 5 cm    Wound Width (cm) 2 cm    Wound Surface Area (cm^2) 10 cm^2    Drainage Amount Moderate    Drainage Description Serosanguineous    Treatment Cleansed;Debridement (Selective)    Wound Properties Date First Assessed: 11/15/22 Time First Assessed: 1040 Wound Type: Skin tear Location: Leg Location Orientation: Left;Proximal;Posterior Present on Admission: Yes   Wound Image Images linked: 1    Dressing Type Silver hydrofiber;Gauze (Comment);Compression wrap    Dressing Changed Changed    Dressing Status Old drainage    Dressing Change Frequency PRN    Site /  Wound Assessment Granulation tissue    % Wound base Red or Granulating 100%   hypergranulated   % Wound base Yellow/Fibrinous Exudate 0%    Wound Length (cm) 0.9 cm    Wound Width (cm) 1.5 cm    Wound Surface Area (cm^2) 1.35 cm^2    Drainage Amount Moderate    Drainage Description Sanguineous    Treatment Cleansed    Selective Debridement (non-excisional) - Location edges of wound    Selective Debridement (non-excisional) - Tools Used Forceps;Scalpel;Scissors    Selective Debridement (non-excisional) - Tissue Removed devitalized tissue, biofilm    Wound Therapy - Clinical Statement see below    Wound Therapy - Functional Problem List dressing, bathing    Factors Delaying/Impairing Wound Healing Diabetes Mellitus;Altered sensation;Multiple medical problems;Polypharmacy;Vascular compromise    Hydrotherapy Plan Debridement;Dressing change;Patient/family education    Wound Therapy - Frequency 2X / week    Wound Therapy - Current Recommendations  PT    Wound Plan cleanse, moisturize, debridement and compression dressing    Dressing  Rt: silver hydrofiber, 4X4, ABD, medipore and compression stocking    Dressing Lt: silver hydrofiber, 1/2" ABD, compression stocking               Tao K Perry is a 52 y.o. male with a history including type 2 diabetes, chronic kidney disease, venous insufficiency, history of DVT and chronic venous stasis ulcer right lateral calf presenting for evaluation of a new wound on his left calf.  He states approximately a month ago he noticed a small "scratch" in his posterior right calf, thought it might have been a bug bite, over the past month it has expanded and yesterday the top layer sloughed off and now it is a ulcer.  Noted edema in B LE.  Mr. Erekson will benefit from skilled PT for debridement and proper dressing to allow a healing environment.    PATIENT EDUCATION: Education details: Keep dressing dry, do not remove unless dressing becomes wet or  painful  Person educated: Patient Education method: Explanation Education comprehension: verbalized understanding   HOME EXERCISE PROGRAM: Ankle pumps    GOALS: Goals reviewed with patient? No  SHORT TERM GOALS: Target date: 09/07/22  Pt wounds to be 100% granulated  Baseline: see above Goal status: IN PROGRESS  2.  Pt wounds to have decreased 50% in size  Baseline: see above Goal status: IN PROGRESS  LONG TERM GOALS: Target date: 12/28/22  Pt wounds to be healed to decrease risk of infection. Baseline:  Goal status: IN PROGRESS  2.  Pt to be using compression garments Baseline: 11/30/22:  Pt stated he plans to purchase later today Goal status: IN PROGRESS  3.  Pt to have obtained and be using a compression pump. Baseline:  Goal status: IN PROGRESS    ASSESSMENT:  CLINICAL IMPRESSION: Wounds  photographed and remeasured with no change Lt and slight reduction in width for Rt LE.  Rt LE continues with a slight odor and Lt friable with hypergranuated wound bed.  Pt is returning to MD regarding lack in progress.  Able to debride away callous and dry borders around Rt LE wound with no debridement needed for Lt.  Continued with silver hydrofiber but changed to simple tape dressing as he is returning to MD tomorrow to remove dressing.  Instructed how to don/doff knee high garments and carefully place over dressing.  Pt reported good fit and comfort with compression.  Will continue to see patient to maintain progress to debride wound and change dressings to promote healing.  Pt to begin wound care at wound center in [place] on 01/25/23 due to lack of progress.  BLE cleansed well with no debridement necessary for Lt.  Selective debridement for removal of callous and dry border around Rt LE wound to promote healing, continued with silverhydrofiber and ABD pad to address drainage.  Lt LE hypergranulated, used 1/2in foam with tight medipore tape to assist with healing.  Pictures taken  under media.  Pt stated he received lymphedema pump, discussed proper use and encouraged to remove compression garments prior use but to leave dressings over wounds to reduce risk of infection, verbalized understanding.    OBJECTIVE IMPAIRMENTS: increased edema, pain, and decreased skin integrity  .   ACTIVITY LIMITATIONS: hygiene/grooming  PARTICIPATION LIMITATIONS: yard work  PERSONAL FACTORS: 3+ comorbidities: DM< CKD, CVI  are also affecting patient's functional outcome.   REHAB POTENTIAL: Fair    CLINICAL  DECISION MAKING: Evolving/moderate complexity  EVALUATION COMPLEXITY: Moderate  PLAN: PT FREQUENCY: 2x/week  PT DURATION: 4 weeks  PLANNED INTERVENTIONS: Patient/Family education, Self Care, and debridement and dressing   PLAN FOR NEXT SESSION: Await MD visit tomorrow for new orders vs continuation/discharge.  Pt to transition wound care to wound center in [place] on 01/25/23, continue appropriate wound care here until [facility] .   Becky Sax, LPTA/CLT; [facility] [phone]  Juel Burrow, PTA 01/10/2023, 10:56 AM

## 2023-01-14 ENCOUNTER — Ambulatory Visit (HOSPITAL_COMMUNITY): Payer: Medicaid Other | Admitting: Physical Therapy

## 2023-01-15 ENCOUNTER — Ambulatory Visit (HOSPITAL_COMMUNITY): Payer: Medicaid Other | Admitting: Physical Therapy

## 2023-01-15 ENCOUNTER — Encounter (HOSPITAL_COMMUNITY): Payer: Self-pay | Admitting: Physical Therapy

## 2023-01-15 ENCOUNTER — Telehealth (HOSPITAL_COMMUNITY): Payer: Self-pay | Admitting: Physical Therapy

## 2023-01-15 NOTE — Telephone Encounter (Signed)
Called pt re missed appointment.  Mailbox is full unable to leave message.  This is pt 3rd no show.  Pt will be discharged.  If pt requests to come back he will need to have a new order from his MD.  Virgina Organ, PT CLT [phone]

## 2023-01-15 NOTE — Therapy (Unsigned)
PHYSICAL THERAPY DISCHARGE SUMMARY  Visits from Start of Care: 14  Current functional level related to goals / functional outcomes: No change of wounds pt is to be referred to the wound center for hyperbaric.  He was to continue here in the meantime, however this is his third no show.    Remaining deficits: Non healing wounds    Education / Equipment: Use of compression pump.    Patient agrees to discharge. Patient goals were not met. Patient is being discharged due to not returning since the last visit.  Pt has had 3 no show  Virgina Organ, PT CLT [phone]

## 2023-01-18 ENCOUNTER — Ambulatory Visit (HOSPITAL_COMMUNITY): Payer: Medicaid Other

## 2023-01-21 ENCOUNTER — Ambulatory Visit (HOSPITAL_COMMUNITY): Payer: Medicaid Other

## 2023-01-24 ENCOUNTER — Ambulatory Visit (HOSPITAL_COMMUNITY): Payer: Medicaid Other | Admitting: Physical Therapy

## 2023-01-25 ENCOUNTER — Ambulatory Visit (HOSPITAL_COMMUNITY): Payer: Medicaid Other

## 2023-01-25 ENCOUNTER — Encounter (HOSPITAL_BASED_OUTPATIENT_CLINIC_OR_DEPARTMENT_OTHER): Payer: Medicaid Other | Attending: Internal Medicine | Admitting: Internal Medicine

## 2023-01-25 DIAGNOSIS — Z794 Long term (current) use of insulin: Secondary | ICD-10-CM | POA: Diagnosis not present

## 2023-01-25 DIAGNOSIS — Z86718 Personal history of other venous thrombosis and embolism: Secondary | ICD-10-CM | POA: Diagnosis not present

## 2023-01-25 DIAGNOSIS — L97822 Non-pressure chronic ulcer of other part of left lower leg with fat layer exposed: Secondary | ICD-10-CM | POA: Insufficient documentation

## 2023-01-25 DIAGNOSIS — L97812 Non-pressure chronic ulcer of other part of right lower leg with fat layer exposed: Secondary | ICD-10-CM | POA: Insufficient documentation

## 2023-01-25 DIAGNOSIS — E11622 Type 2 diabetes mellitus with other skin ulcer: Secondary | ICD-10-CM | POA: Diagnosis not present

## 2023-01-25 DIAGNOSIS — Z8249 Family history of ischemic heart disease and other diseases of the circulatory system: Secondary | ICD-10-CM | POA: Diagnosis not present

## 2023-01-25 DIAGNOSIS — N182 Chronic kidney disease, stage 2 (mild): Secondary | ICD-10-CM | POA: Diagnosis not present

## 2023-01-25 DIAGNOSIS — I87313 Chronic venous hypertension (idiopathic) with ulcer of bilateral lower extremity: Secondary | ICD-10-CM | POA: Insufficient documentation

## 2023-01-25 DIAGNOSIS — I129 Hypertensive chronic kidney disease with stage 1 through stage 4 chronic kidney disease, or unspecified chronic kidney disease: Secondary | ICD-10-CM | POA: Diagnosis not present

## 2023-01-25 DIAGNOSIS — E1122 Type 2 diabetes mellitus with diabetic chronic kidney disease: Secondary | ICD-10-CM | POA: Diagnosis not present

## 2023-01-25 NOTE — Progress Notes (Signed)
[name], [name] ([id number]) [id number] Page 1 of 4 Visit Report for 01/25/2023 Abuse Risk Screen Details Patient Name: Date of Service: [name]. 01/25/2023 8:00 A M Medical Record Number: [id number] Patient Account Number: [id number] Date of Birth/Sex: Treating RN: Mar 30, 1971 (52 y.o. [name] Primary Care Kanaya Gunnarson: Barbette Reichmann Other Clinician: Referring Leighanne Adolph: Treating [name]: Adolph Pollack Weeks in Treatment: 0 Abuse Risk Screen Items Answer ABUSE RISK SCREEN: Has anyone close to you tried to hurt or harm you recentlyo No Do you feel uncomfortable with anyone in your familyo No Has anyone forced you do things that you didnt want to doo No Electronic Signature(s) Signed: 01/25/2023 2:18:28 PM By: Karie Schwalbe RN Entered By: Karie Schwalbe on 01/25/2023 08:22:02 -------------------------------------------------------------------------------- Activities of Daily Living Details Patient Name: Date of Service: [name]. 01/25/2023 8:00 A M Medical Record Number: [id number] Patient Account Number: [id number] Date of Birth/Sex: Treating RN: 28-Jul-1970 (52 y.o. [name] Primary Care Aniyha Tate: Barbette Reichmann Other Clinician: Referring Zeth Buday: Treating Medhansh Brinkmeier/Extender: Adolph Pollack Weeks in Treatment: 0 Activities of Daily Living Items Answer Activities of Daily Living (Please select one for each item) Drive Automobile Completely Able T Medications ake Completely Able Use T elephone Completely Able Care for Appearance Completely Able Use T oilet Completely Able Bath / Shower Completely Able Dress Self Completely Able Feed Self Completely Able Walk Completely Able Get In / Out Bed Completely Able Housework Completely Able Prepare Meals Completely Able Handle Money Completely Able Shop for Self Completely Able Electronic  Signature(s) Signed: 01/25/2023 2:18:28 PM By: Karie Schwalbe RN Entered By: Karie Schwalbe on 01/25/2023 08:22:29 [name] ([id number]) [id number] Page 2 of 4 -------------------------------------------------------------------------------- Education Screening Details Patient Name: Date of Service: [name]. 01/25/2023 8:00 A M Medical Record Number: [id number] Patient Account Number: [id number] Date of Birth/Sex: Treating RN: 03-14-1971 (52 y.o. [name] Primary Care Solana Coggin: Barbette Reichmann Other Clinician: Referring My Rinke: Treating Javarri Segal/Extender: Adolph Pollack Weeks in Treatment: 0 Primary Learner Assessed: Patient Learning Preferences/Education Level/Primary Language Learning Preference: Explanation, Demonstration, Printed Material Highest Education Level: College or Above Preferred Language: English Cognitive Barrier Language Barrier: No Translator Needed: No Memory Deficit: No Emotional Barrier: No Cultural/Religious Beliefs Affecting Medical Care: No Physical Barrier Impaired Vision: No Impaired Hearing: No Decreased Hand dexterity: No Knowledge/Comprehension Knowledge Level: High Comprehension Level: High Ability to understand written instructions: High Ability to understand verbal instructions: High Motivation Anxiety Level: Calm Cooperation: Cooperative Education Importance: Acknowledges Need Interest in Health Problems: Asks Questions Perception: Coherent Willingness to Engage in Self-Management High Activities: Readiness to Engage in Self-Management High Activities: Electronic Signature(s) Signed: 01/25/2023 2:18:28 PM By: Karie Schwalbe RN Entered By: Karie Schwalbe on 01/25/2023 08:23:09 -------------------------------------------------------------------------------- Fall Risk Assessment Details Patient Name: Date of Service: [name] [name] K.  01/25/2023 8:00 A M Medical Record Number: [id number] Patient Account Number: [id number] Date of Birth/Sex: Treating RN: [birth year] (52 y.o. [name] Primary Care Kaytie Ratcliffe: Barbette Reichmann Other Clinician: Referring Lateefa Crosby: Treating Caliyah Sieh/Extender: Adolph Pollack Weeks in Treatment: 0 Fall Risk Assessment Items Have you had 2 or more falls in the last 12 monthso 0 No Price, [name] K ([id number]) [phone] [id number] Page 3 of 4 Have you had any fall that resulted in injury in the last 12 monthso 0 No FALLS RISK SCREEN History of falling - immediate or within 3 months 0 No Secondary diagnosis (Do you have 2 or more medical  diagnoseso) 0 No Ambulatory aid None/bed rest/wheelchair/nurse 0 No Crutches/cane/walker 0 No Furniture 0 No Intravenous therapy Access/Saline/Heparin Lock 0 No Gait/Transferring Normal/ bed rest/ wheelchair 0 No Weak (short steps with or without shuffle, stooped but able to lift head while walking, may seek 0 No support from furniture) Impaired (short steps with shuffle, may have difficulty arising from chair, head down, impaired 0 No balance) Mental Status Oriented to own ability 0 No Electronic Signature(s) Signed: 01/25/2023 2:18:28 PM By: Karie Schwalbe RN Entered By: Karie Schwalbe on 01/25/2023 08:23:15 -------------------------------------------------------------------------------- Foot Assessment Details Patient Name: Date of Service: [name] [name] K. 01/25/2023 8:00 A M Medical Record Number: [id number] Patient Account Number: [id number] Date of Birth/Sex: Treating RN: Jun 16, 1971 (52 y.o. [name] Primary Care Quientin Jent: Barbette Reichmann Other Clinician: Referring [name]: Treating [name]/Extender: Adolph Pollack Weeks in Treatment: 0 Foot Assessment Items Site Locations + = Sensation present, - = Sensation absent, C = Callus, U = Ulcer R =  Redness, W = Warmth, M = Maceration, PU = Pre-ulcerative lesion F = Fissure, S = Swelling, D = Dryness Assessment Right: Left: Other Deformity: No No Prior Foot Ulcer: No No Prior Amputation: No No Charcot Joint: No No Ambulatory Status: Ambulatory Without Help GaitSEAN, [name] ([id number]) [phone] [id number] Page 4 of 4 Electronic Signature(s) Signed: 01/25/2023 2:18:28 PM By: Karie Schwalbe RN Entered By: Karie Schwalbe on 01/25/2023 08:28:34 -------------------------------------------------------------------------------- Nutrition Risk Screening Details Patient Name: Date of Service: [name]. 01/25/2023 8:00 A M Medical Record Number: [id number] Patient Account Number: [id number] Date of Birth/Sex: Treating RN: [birth year] (52 y.o. [name] Primary Care Cadyn Fann: Barbette Reichmann Other Clinician: Referring [name]: Treating Chancy Claros/Extender: Marcha Dutton, Vishwanath Weeks in Treatment: 0 Height (in): 70 Weight (lbs): 264 Body Mass Index (BMI): 37.9 Nutrition Risk Screening Items Score Screening NUTRITION RISK SCREEN: I have an illness or condition that made me change the kind and/or amount of food I eat 2 Yes I eat fewer than two meals per day 0 No I eat few fruits and vegetables, or milk products 0 No I have three or more drinks of beer, liquor or wine almost every day 0 No I have tooth or mouth problems that make it hard for me to eat 0 No I don't always have enough money to buy the food I need 0 No I eat alone most of the time 0 No I take three or more different prescribed or over-the-counter drugs a day 1 Yes Without wanting to, I have lost or gained 10 pounds in the last six months 0 No I am not always physically able to shop, cook and/or feed myself 0 No Nutrition Protocols Good Risk Protocol Moderate Risk Protocol 0 Provide education on nutrition High Risk Proctocol Risk Level: Moderate  Risk Score: 3 Electronic Signature(s) Signed: 01/25/2023 1:53:12 PM By: Shawn Stall RN, BSN Signed: 01/25/2023 2:18:28 PM By: Karie Schwalbe RN Entered By: Karie Schwalbe on 01/25/2023 08:26:18

## 2023-01-25 NOTE — Progress Notes (Signed)
AGEE, [name] ([id number]) [id number] Page 1 of 11 Visit Report for 01/25/2023 Allergy List Details Patient Name: Date of Service: [name] 01/25/2023 8:00 A M Medical Record Number: [id number] Patient Account Number: [id number] Date of Birth/Sex: Treating RN: 1970/08/05 (52 y.o. [name] Primary Care Shiza Thelen: Barbette Reichmann Other Clinician: Referring Mysha Peeler: Treating Lorrinda Ramstad/Extender: Marcha Dutton, Vishwanath Weeks in Treatment: 0 Allergies Active Allergies No Known Drug Allergies Allergy Notes Electronic Signature(s) Signed: 01/25/2023 2:18:28 PM By: Karie Schwalbe RN Entered By: Karie Schwalbe on 01/25/2023 08:18:54 -------------------------------------------------------------------------------- Arrival Information Details Patient Name: Date of Service: [name] 01/25/2023 8:00 A M Medical Record Number: [id number] Patient Account Number: [id number] Date of Birth/Sex: Treating RN: 03/09/1971 (52 y.o. [name] Primary Care Mads Borgmeyer: Barbette Reichmann Other Clinician: Referring Adaliz Dobis: Treating Azarie Coriz/Extender: Adolph Pollack Weeks in Treatment: 0 Visit Information Patient Arrived: Ambulatory Arrival Time: 08:11 Accompanied By: self Transfer Assistance: None Patient Identification Verified: Yes Electronic Signature(s) Signed: 01/25/2023 2:18:28 PM By: Karie Schwalbe RN Entered By: Karie Schwalbe on 01/25/2023 09:02:06 -------------------------------------------------------------------------------- Clinic Level of Care Assessment Details Patient Name: Date of Service: [name] 01/25/2023 8:00 A M Medical Record Number: [id number] Patient Account Number: [id number] Date of Birth/Sex: Treating RN: March 10, 1971 (52 y.o. [name] Primary Care Chariah Bailey: Barbette Reichmann Other Clinician: Referring Ladonne Sharples: Treating Mariano Doshi/Extender:  Adolph Pollack [place], [name] ([id number]) [id number] Page 2 of 11 Weeks in Treatment: 0 Clinic Level of Care Assessment Items TOOL 1 Quantity Score X- 1 0 Use when EandM and Procedure is performed on INITIAL visit ASSESSMENTS - Nursing Assessment / Reassessment X- 1 20 General Physical Exam (combine w/ comprehensive assessment (listed just below) when performed on new pt. evals) X- 1 25 Comprehensive Assessment (HX, ROS, Risk Assessments, Wounds Hx, etc.) ASSESSMENTS - Wound and Skin Assessment / Reassessment X- 1 10 Dermatologic / Skin Assessment (not related to wound area) ASSESSMENTS - Ostomy and/or Continence Assessment and Care []  - 0 Incontinence Assessment and Management []  - 0 Ostomy Care Assessment and Management (repouching, etc.) PROCESS - Coordination of Care X - Simple Patient / Family Education for ongoing care 1 15 []  - 0 Complex (extensive) Patient / Family Education for ongoing care X- 1 10 Staff obtains Chiropractor, Records, T Results / Process Orders est X- 1 10 Staff telephones HHA, Nursing Homes / Clarify orders / etc []  - 0 Routine Transfer to another Facility (non-emergent condition) []  - 0 Routine Hospital Admission (non-emergent condition) X- 1 15 New Admissions / Manufacturing engineer / Ordering NPWT Apligraf, etc. , []  - 0 Emergency Hospital Admission (emergent condition) PROCESS - Special Needs []  - 0 Pediatric / Minor Patient Management []  - 0 Isolation Patient Management []  - 0 Hearing / Language / Visual special needs []  - 0 Assessment of Community assistance (transportation, D/C planning, etc.) []  - 0 Additional assistance / Altered mentation []  - 0 Support Surface(s) Assessment (bed, cushion, seat, etc.) INTERVENTIONS - Miscellaneous []  - 0 External ear exam []  - 0 Patient Transfer (multiple staff / Nurse, adult / Similar devices) []  - 0 Simple Staple / Suture removal (25 or  less) []  - 0 Complex Staple / Suture removal (26 or more) []  - 0 Hypo/Hyperglycemic Management (do not check if billed separately) X- 1 15 Ankle / Brachial Index (ABI) - do not check if billed separately Has the patient been seen at the hospital within the last three years: Yes Total Score: 120 Level  Of Care: New/Established - Level 4 Electronic Signature(s) Signed: 01/25/2023 2:18:28 PM By: Karie Schwalbe RN Entered By: Karie Schwalbe on 01/25/2023 13:58:03 -------------------------------------------------------------------------------- Compression Therapy Details Patient Name: Date of Service: [name] K. 01/25/2023 8:00 A EDENILSON, [name] ([id number]) [phone].pdf Page 3 of 11 Medical Record Number: [id number] Patient Account Number: [id number] Date of Birth/Sex: Treating RN: [birth year] (52 y.o. [name] Primary Care [name]: Barbette Reichmann Other Clinician: Referring [name]: Treating [name]/Extender: Adolph Pollack Weeks in Treatment: 0 Compression Therapy Performed for Wound Assessment: Wound #1 Right,Lateral Lower Leg Performed By: Clinician Karie Schwalbe, RN Compression Type: Three Layer Post Procedure Diagnosis Same as Pre-procedure Electronic Signature(s) Signed: 01/25/2023 2:18:28 PM By: Karie Schwalbe RN Entered By: Karie Schwalbe on 01/25/2023 09:20:42 -------------------------------------------------------------------------------- Compression Therapy Details Patient Name: Date of Service: [name] 01/25/2023 8:00 A M Medical Record Number: [id number] Patient Account Number: [id number] Date of Birth/Sex: Treating RN: May 08, 1971 (52 y.o. [name] Primary Care Miriam Kestler: Barbette Reichmann Other Clinician: Referring [name]: Treating [name]/Extender: Adolph Pollack Weeks in Treatment: 0 Compression Therapy Performed for Wound Assessment: Wound  #2 Left,Posterior Lower Leg Performed By: Clinician Karie Schwalbe, RN Compression Type: Three Layer Post Procedure Diagnosis Same as Pre-procedure Electronic Signature(s) Signed: 01/25/2023 2:18:28 PM By: Karie Schwalbe RN Entered By: Karie Schwalbe on 01/25/2023 09:20:42 -------------------------------------------------------------------------------- Encounter Discharge Information Details Patient Name: Date of Service: [name] 01/25/2023 8:00 A M Medical Record Number: [id number] Patient Account Number: [id number] Date of Birth/Sex: Treating RN: 03/17/1971 (52 y.o. [name] Primary Care Denee Boeder: Barbette Reichmann Other Clinician: Referring Kyllie Pettijohn: Treating Nathanal Hermiz/Extender: Adolph Pollack Weeks in Treatment: 0 Encounter Discharge Information Items Post Procedure Vitals Discharge Condition: Stable Temperature (F): 98.8 Ambulatory Status: Ambulatory Pulse (bpm): 66 Discharge Destination: Home Respiratory Rate (breaths/min): 16 Transportation: Private Auto Blood Pressure (mmHg): 138/80 Accompanied By: self Schedule Follow-up Appointment: Yes Clinical Summary of Care: Patient Declined Electronic Signature(s) Signed: 01/25/2023 2:18:28 PM By: Karie Schwalbe RN Justice Britain, [place] K ([id number]) [id number] Page 4 of 11 Entered By: Karie Schwalbe on 01/25/2023 13:59:13 -------------------------------------------------------------------------------- Lower Extremity Assessment Details Patient Name: Date of Service: [name] 01/25/2023 8:00 A M Medical Record Number: [id number] Patient Account Number: [id number] Date of Birth/Sex: Treating RN: 05/30/1971 (52 y.o. [name] Primary Care Miyonna Ormiston: Barbette Reichmann Other Clinician: Referring Nitish Roes: Treating Huey Scalia/Extender: Adolph Pollack Weeks in Treatment: 0 Edema Assessment Assessed: [Left: No] [Right:  No] Edema: [Left: Yes] [Right: Yes] Calf Left: Right: Point of Measurement: 34 cm From Medial Instep 37 cm 24.8 cm Ankle Left: Right: Point of Measurement: 10 cm From Medial Instep 25 cm 39 cm Knee To Floor Left: Right: From Medial Instep 43 cm 43 cm Vascular Assessment Pulses: Dorsalis Pedis Palpable: [Left:Yes] [Right:Yes] Posterior Tibial Doppler Audible: [Left:Yes] [Right:Yes] Extremity colors, hair growth, and conditions: Extremity Color: [Left:Normal] [Right:Normal] Hair Growth on Extremity: [Left:No] [Right:No] Temperature of Extremity: [Left:Warm] [Right:Warm] Capillary Refill: [Left:< 3 seconds] [Right:< 3 seconds] Dependent Rubor: [Left:No] [Right:No] Blanched when Elevated: [Left:No] [Right:No] Lipodermatosclerosis: [Left:No] [Right:No] Blood Pressure: Brachial: [Left:138] [Right:138] Ankle: [Left:Dorsalis Pedis: 122 0.88] [Right:Dorsalis Pedis: 130 0.94] Toe Nail Assessment Left: Right: Thick: Yes Yes Discolored: Yes Yes Deformed: No No Improper Length and Hygiene: No No Electronic Signature(s) Signed: 01/25/2023 2:18:28 PM By: Karie Schwalbe RN Entered By: Karie Schwalbe on 01/25/2023 09:01:42 [name] ([id number]) [id number] Page 5 of 11 -------------------------------------------------------------------------------- Multi Wound Chart Details Patient Name: Date of Service: HA  [name] K. 01/25/2023 8:00 A M Medical Record Number: [id number] Patient Account Number: [id number] Date of Birth/Sex: Treating RN: 14-Apr-1971 (52 y.o. M) Primary Care Altha Sweitzer: Barbette Reichmann Other Clinician: Referring Sherwin Hollingshed: Treating Elchanan Bob/Extender: Marcha Dutton, Vishwanath Weeks in Treatment: 0 Vital Signs Height(in): 70 Pulse(bpm): Weight(lbs): 264 Blood Pressure(mmHg): Body Mass Index(BMI): 37.9 Temperature(F): Respiratory Rate(breaths/min): 16 [1:Photos:] [N/A:N/A] Right, Lateral Lower Leg Left, Posterior  Lower Leg N/A Wound Location: Other Lesion Bite N/A Wounding Event: Diabetic Wound/Ulcer of the Lower Diabetic Wound/Ulcer of the Lower N/A Primary Etiology: Extremity Extremity Anemia, Deep Vein Thrombosis, Anemia, Deep Vein Thrombosis, N/A Comorbid History: Hypertension, Peripheral Venous Hypertension, Peripheral Venous Disease, Type II Diabetes, Gout Disease, Type II Diabetes, Gout 01/01/2018 09/14/2022 N/A Date Acquired: 0 0 N/A Weeks of Treatment: Open Open N/A Wound Status: No No N/A Wound Recurrence: 6.4x2.6x0.4 2.1x2x0.1 N/A Measurements L x W x D (cm) 13.069 3.299 N/A A (cm) : rea 5.228 0.33 N/A Volume (cm) : Grade 2 Grade 1 N/A Classification: Medium Medium N/A Exudate A mount: Serosanguineous Serosanguineous N/A Exudate Type: red, brown red, brown N/A Exudate Color: Large (67-100%) Large (67-100%) N/A Granulation A mount: Red, Pink Red, Friable N/A Granulation Quality: Small (1-33%) Small (1-33%) N/A Necrotic A mount: Eschar, Adherent Slough Adherent Slough N/A Necrotic Tissue: Fat Layer (Subcutaneous Tissue): Yes Fat Layer (Subcutaneous Tissue): Yes N/A Exposed Structures: Fascia: No Tendon: No Muscle: No Joint: No Bone: No Small (1-33%) Small (1-33%) N/A Epithelialization: Debridement - Excisional N/A N/A Debridement: Pre-procedure Verification/Time Out 09:15 N/A N/A Taken: Lidocaine 4% Topical Solution N/A N/A Pain Control: Subcutaneous, Slough N/A N/A Tissue Debrided: Skin/Subcutaneous Tissue N/A N/A Level: 13.06 N/A N/A Debridement A (sq cm): rea Curette N/A N/A Instrument: Minimum N/A N/A Bleeding: Pressure N/A N/A Hemostasis A chieved: 0 N/A N/A Procedural Pain: Procedure was tolerated well N/A N/A Debridement Treatment Response: 6.4x2.6x0.4 N/A N/A Post Debridement Measurements L x W x D (cm) 5.228 N/A N/A Post Debridement Volume: (cm) Excoriation: No Excoriation: No N/A Periwound Skin Texture: Induration:  No Induration: No Callus: No Callus: No Crepitus: No Crepitus: No CARDERO, QUIZHPI ([id number]) [id number] Page 6 of 11 Rash: No Rash: No Scarring: No Scarring: No Maceration: No Maceration: No N/A Periwound Skin Moisture: Dry/Scaly: No Dry/Scaly: No Atrophie Blanche: No Atrophie Blanche: No N/A Periwound Skin Color: Cyanosis: No Cyanosis: No Ecchymosis: No Ecchymosis: No Erythema: No Erythema: No Hemosiderin Staining: No Hemosiderin Staining: No Mottled: No Mottled: No Pallor: No Pallor: No Rubor: No Rubor: No No Abnormality No Abnormality N/A Temperature: Compression Therapy Chemical Cauterization N/A Procedures Performed: Debridement Compression Therapy Treatment Notes Electronic Signature(s) Signed: 01/25/2023 9:53:20 AM By: Geralyn Corwin DO Entered By: Geralyn Corwin on 01/25/2023 09:22:57 -------------------------------------------------------------------------------- Multi-Disciplinary Care Plan Details Patient Name: Date of Service: [name] 01/25/2023 8:00 A M Medical Record Number: [id number] Patient Account Number: [id number] Date of Birth/Sex: Treating RN: 1970/12/07 (52 y.o. [name] Primary Care Smt Lokey: Barbette Reichmann Other Clinician: Referring Anelis Hrivnak: Treating Amberlynn Tempesta/Extender: Adolph Pollack Weeks in Treatment: 0 Active Inactive Wound/Skin Impairment Nursing Diagnoses: Impaired tissue integrity Goals: Patient/caregiver will verbalize understanding of skin care regimen Date Initiated: 01/25/2023 Target Resolution Date: 05/16/2023 Goal Status: Active Interventions: Assess ulceration(s) every visit Treatment Activities: Skin care regimen initiated : 01/25/2023 Notes: Electronic Signature(s) Signed: 01/25/2023 2:18:28 PM By: Karie Schwalbe RN Entered By: Karie Schwalbe on 01/25/2023  13:57:02 -------------------------------------------------------------------------------- Pain Assessment Details Patient Name: Date of Service: [name] 01/25/2023 8:00 A M Medical Record  Number: [id number] Patient Account Number: [id number] KOWEN, CHIHUAHUA ([id number]) [phone].pdf Page 7 of 11 Date of Birth/Sex: Treating RN: 17-Feb-1971 (52 y.o. [name] Primary Care Emanuelle Hammerstrom: Barbette Reichmann Other Clinician: Referring Caiden Monsivais: Treating Rylan Kaufmann/Extender: Adolph Pollack Weeks in Treatment: 0 Active Problems Location of Pain Severity and Description of Pain Patient Has Paino No Site Locations Pain Management and Medication Current Pain Management: Electronic Signature(s) Signed: 01/25/2023 2:18:28 PM By: Karie Schwalbe RN Entered By: Karie Schwalbe on 01/25/2023 09:05:51 -------------------------------------------------------------------------------- Patient/Caregiver Education Details Patient Name: Date of Service: [name] 7/12/2024andnbsp8:00 A M Medical Record Number: [id number] Patient Account Number: [id number] Date of Birth/Gender: Treating RN: April 16, 1971 (52 y.o. [name] Primary Care Physician: Barbette Reichmann Other Clinician: Referring Physician: Treating Physician/Extender: Adolph Pollack Weeks in Treatment: 0 Education Assessment Education Provided To: Patient Education Topics Provided Wound/Skin Impairment: Methods: Explain/Verbal Responses: Return demonstration correctly Electronic Signature(s) Signed: 01/25/2023 2:18:28 PM By: Karie Schwalbe RN Entered By: Karie Schwalbe on 01/25/2023 13:57:11 [name] ([id number]) [id number] Page 8 of 11 -------------------------------------------------------------------------------- Wound Assessment Details Patient Name: Date of Service: [name]  01/25/2023 8:00 A M Medical Record Number: [id number] Patient Account Number: [id number] Date of Birth/Sex: Treating RN: Jan 16, 1971 (52 y.o. [name] Primary Care Signa Cheek: Barbette Reichmann Other Clinician: Referring Zaccai Chavarin: Treating Edita Weyenberg/Extender: Adolph Pollack Weeks in Treatment: 0 Wound Status Wound Number: 1 Primary Diabetic Wound/Ulcer of the Lower Extremity Etiology: Wound Location: Right, Lateral Lower Leg Wound Open Wounding Event: Other Lesion Status: Date Acquired: 01/01/2018 Notes: Pt. 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 Weeks Of Treatment: 0 Comorbid Anemia, Deep Vein Thrombosis, Hypertension, Peripheral Venous Clustered Wound: No History: Disease, Type II Diabetes, Gout Photos Wound Measurements Length: (cm) 6.4 Width: (cm) 2.6 Depth: (cm) 0.4 Area: (cm) 13. Volume: (cm) 5.2 % Reduction in Area: % Reduction in Volume: Epithelialization: Small (1-33%) 069 Tunneling: No 28 Undermining: No Wound Description Classification: Grade 2 Exudate Amount: Medium Exudate Type: Serosanguineous Exudate Color: red, brown Foul Odor After Cleansing: No Slough/Fibrino Yes Wound Bed Granulation Amount: Large (67-100%) Exposed Structure Granulation Quality: Red, Pink Fat Layer (Subcutaneous Tissue) Exposed: Yes Necrotic Amount: Small (1-33%) Necrotic Quality: Eschar, Adherent Slough Periwound Skin Texture Texture Color No Abnormalities Noted: No No Abnormalities Noted: No Callus: No Atrophie Blanche: No Crepitus: No Cyanosis: No Excoriation: No Ecchymosis: No Induration: No Erythema: No Rash: No Hemosiderin Staining: No Scarring: No Mottled: No Pallor: No Moisture Rubor: No No Abnormalities Noted: No Dry / Scaly: No Temperature / Pain Maceration: No Temperature: No Abnormality Treatment Notes Wound #1 (Lower Leg) Wound Laterality: Right, Lateral Cleanser BULMARO, TALBOTT K ([id number])  [id number] Page 9 of 11 Soap and Water Discharge Instruction: May shower and wash wound with dial antibacterial soap and water prior to dressing change. Vashe 5.8 (oz) Discharge Instruction: Cleanse the wound with Vashe prior to applying a clean dressing using gauze sponges, not tissue or cotton balls. Peri-Wound Care Sween Lotion (Moisturizing lotion) Discharge Instruction: Apply moisturizing lotion as directed Topical Gentamicin Discharge Instruction: As directed by physician Mupirocin Ointment Discharge Instruction: Apply Mupirocin (Bactroban) as instructed Primary Dressing Hydrofera Blue Ready Transfer Foam, 4x5 (in/in) Discharge Instruction: Apply to wound bed as instructed Secondary Dressing ABD Pad, 8x10 Discharge Instruction: Apply over primary dressing as directed. Woven Gauze Sponge, Non-Sterile 4x4 in Discharge Instruction: Apply over primary dressing as directed. Secured With Compression Wrap Urgo K2 Lite, (equivalent to a 3 layer) two layer compression system,  regular Discharge Instruction: or Apply Urgo K2 Lite as directed (alternative to 3 layer compression). ThreePress (3 layer compression wrap) Discharge Instruction: Apply three layer compression as directed. Stockinette Compression Stockings Add-Ons Electronic Signature(s) Signed: 01/25/2023 2:18:28 PM By: Karie Schwalbe RN Entered By: Karie Schwalbe on 01/25/2023 08:58:53 -------------------------------------------------------------------------------- Wound Assessment Details Patient Name: Date of Service: [name] 01/25/2023 8:00 A M Medical Record Number: [id number] Patient Account Number: [id number] Date of Birth/Sex: Treating RN: [birth year] (52 y.o. [name] Primary Care Jerimey Burridge: Barbette Reichmann Other Clinician: Referring Zhyon Antenucci: Treating Layli Capshaw/Extender: Marcha Dutton, Vishwanath Weeks in Treatment: 0 Wound Status Wound Number: 2  Primary Diabetic Wound/Ulcer of the Lower Extremity Etiology: Wound Location: Left, Posterior Lower Leg Wound Open Wounding Event: Bite Status: Date Acquired: 09/14/2022 Comorbid Anemia, Deep Vein Thrombosis, Hypertension, Peripheral Venous Weeks Of Treatment: 0 History: Disease, Type II Diabetes, Gout Clustered Wound: No Photos RISHAB, BIAGIONI ([id number]) [id number] Page 10 of 11 Wound Measurements Length: (cm) 2.1 Width: (cm) 2 Depth: (cm) 0.1 Area: (cm) 3.299 Volume: (cm) 0.33 % Reduction in Area: % Reduction in Volume: Epithelialization: Small (1-33%) Tunneling: No Undermining: No Wound Description Classification: Grade 1 Exudate Amount: Medium Exudate Type: Serosanguineous Exudate Color: red, brown Foul Odor After Cleansing: No Slough/Fibrino Yes Wound Bed Granulation Amount: Large (67-100%) Exposed Structure Granulation Quality: Red, Friable Fascia Exposed: No Necrotic Amount: Small (1-33%) Fat Layer (Subcutaneous Tissue) Exposed: Yes Necrotic Quality: Adherent Slough Tendon Exposed: No Muscle Exposed: No Joint Exposed: No Bone Exposed: No Periwound Skin Texture Texture Color No Abnormalities Noted: No No Abnormalities Noted: No Callus: No Atrophie Blanche: No Crepitus: No Cyanosis: No Excoriation: No Ecchymosis: No Induration: No Erythema: No Rash: No Hemosiderin Staining: No Scarring: No Mottled: No Pallor: No Moisture Rubor: No No Abnormalities Noted: No Dry / Scaly: No Temperature / Pain Maceration: No Temperature: No Abnormality Treatment Notes Wound #2 (Lower Leg) Wound Laterality: Left, Posterior Cleanser Soap and Water Discharge Instruction: May shower and wash wound with dial antibacterial soap and water prior to dressing change. Vashe 5.8 (oz) Discharge Instruction: Cleanse the wound with Vashe prior to applying a clean dressing using gauze sponges, not tissue or cotton balls. Peri-Wound Care Sween  Lotion (Moisturizing lotion) Discharge Instruction: Apply moisturizing lotion as directed Topical Gentamicin Discharge Instruction: As directed by physician Mupirocin Ointment Discharge Instruction: Apply Mupirocin (Bactroban) as instructed Primary Dressing Hydrofera Blue Ready Transfer Foam, 4x5 (in/in) Discharge Instruction: Apply to wound bed as instructed JACIEON, SHORTINO ([id number]) [phone].pdf Page 11 of 11 Secondary Dressing ABD Pad, 8x10 Discharge Instruction: Apply over primary dressing as directed. Woven Gauze Sponge, Non-Sterile 4x4 in Discharge Instruction: Apply over primary dressing as directed. Secured With Compression Wrap Urgo K2 Lite, (equivalent to a 3 layer) two layer compression system, regular Discharge Instruction: or Apply Urgo K2 Lite as directed (alternative to 3 layer compression). ThreePress (3 layer compression wrap) Discharge Instruction: Apply three layer compression as directed. Stockinette Compression Stockings Add-Ons Electronic Signature(s) Signed: 01/25/2023 2:18:28 PM By: Karie Schwalbe RN Entered By: Karie Schwalbe on 01/25/2023 08:59:24 -------------------------------------------------------------------------------- Vitals Details Patient Name: Date of Service: [name] 01/25/2023 8:00 A M Medical Record Number: [id number] Patient Account Number: [id number] Date of Birth/Sex: Treating RN: [birth year] (52 y.o. [name] Primary Care Armond Cuthrell: Barbette Reichmann Other Clinician: Referring Damyon Mullane: Treating Giovanni Biby/Extender: Adolph Pollack Weeks in Treatment: 0 Vital Signs Time Taken: 08:14 Temperature (F): 98.8 Height (in): 70 Pulse (bpm): 66 Weight (lbs): 264 Respiratory Rate (breaths/min): 16  Body Mass Index (BMI): 37.9 Blood Pressure (mmHg): 138/80 Reference Range: 80 - 120 mg / dl Electronic Signature(s) Signed: 01/25/2023 2:18:28 PM By: Karie Schwalbe  RN Entered By: Karie Schwalbe on 01/25/2023 13:56:28

## 2023-01-28 ENCOUNTER — Ambulatory Visit (HOSPITAL_COMMUNITY): Payer: Medicaid Other | Admitting: Physical Therapy

## 2023-01-29 DIAGNOSIS — H5213 Myopia, bilateral: Secondary | ICD-10-CM | POA: Diagnosis not present

## 2023-01-29 NOTE — Progress Notes (Addendum)
EYON, SIGERS ([id number]) [id number] Page 1 of 10 Visit Report for 01/25/2023 Chief Complaint Document Details Patient Name: Date of Service: [name] [name]. 01/25/2023 8:00 A M Medical Record Number: [id number] Patient Account Number: [id number] Date of Birth/Sex: Treating RN: 1970/07/26 (52 y.o. M) Primary Care Provider: Barbette Reichmann Other Clinician: Referring Provider: Treating Provider/Extender: Marcha Dutton, Vishwanath Weeks in Treatment: 0 Information Obtained from: Patient Chief Complaint 01/25/2023; bilateral lower extremity wounds Electronic Signature(s) Signed: 01/25/2023 9:53:20 AM By: Geralyn Corwin DO Entered By: Geralyn Corwin on 01/25/2023 09:23:04 -------------------------------------------------------------------------------- Debridement Details Patient Name: Date of Service: [name] [name]. 01/25/2023 8:00 A M Medical Record Number: [id number] Patient Account Number: [id number] Date of Birth/Sex: Treating RN: 11-02-1970 (52 y.o. [name] Primary Care Provider: Barbette Reichmann Other Clinician: Referring Provider: Treating Provider/Extender: Adolph Pollack Weeks in Treatment: 0 Debridement Performed for Assessment: Wound #1 Right,Lateral Lower Leg Performed By: Physician Geralyn Corwin, DO Debridement Type: Debridement Severity of Tissue Pre Debridement: Fat layer exposed Level of Consciousness (Pre-procedure): Awake and Alert Pre-procedure Verification/Time Out Yes - 09:15 Taken: Start Time: 09:15 Pain Control: Lidocaine 4% T opical Solution Percent of Wound Bed Debrided: 100% T Area Debrided (cm): otal 13.06 Tissue and other material debrided: Viable, Non-Viable, Slough, Subcutaneous, Slough Level: Skin/Subcutaneous Tissue Debridement Description: Excisional Instrument: Curette Bleeding: Minimum Hemostasis Achieved: Pressure End Time: 09:17 Procedural Pain:  0 Response to Treatment: Procedure was tolerated well Level of Consciousness (Post- Awake and Alert procedure): Post Debridement Measurements of Total Wound Length: (cm) 6.4 Width: (cm) 2.6 Depth: (cm) 0.4 Volume: (cm) 5.228 Character of Wound/Ulcer Post Debridement: Improved Severity of Tissue Post Debridement: Fat layer exposed [name], [name] ([id number]) [id number] Page 2 of 10 Post Procedure Diagnosis Same as Pre-procedure Notes Scribed for Dr. Mikey Bussing by J.Scotton Electronic Signature(s) Signed: 01/25/2023 9:53:20 AM By: Geralyn Corwin DO Signed: 01/25/2023 2:18:28 PM By: Karie Schwalbe RN Entered By: Karie Schwalbe on 01/25/2023 09:20:16 -------------------------------------------------------------------------------- HPI Details Patient Name: Date of Service: [name] [name]. 01/25/2023 8:00 A M Medical Record Number: [id number] Patient Account Number: [id number] Date of Birth/Sex: Treating RN: Apr 28, 1971 (52 y.o. M) Primary Care Provider: Barbette Reichmann Other Clinician: Referring Provider: Treating Provider/Extender: Adolph Pollack Weeks in Treatment: 0 History of Present Illness HPI Description: 01/25/2023 Mr. Kyl Kobashigawa is a 52 year old male with a past medical history of controlled type 2 diabetes on insulin, venous insufficiency and DVT that presents to the clinic for bilateral lower extremity wounds. He states that the wound on the left was started by a bug bite and has been present for the past 4 months. The right lower extremity wound he states started out as a soft tissue infection and has been present for the past 6 years. He has been treated by a wound care center in [place] [facility] in [place]. He was last seen in the wound care center 2 years ago Could not continue following up due to lack of transportation. He states that recently the rehab center has been wrapping his legs with  compression wrap and a wound dressing underneath. It is unclear when the last time this happened was. He is not wearing any compression today. He currently denies signs of infection. Electronic Signature(s) Signed: 01/25/2023 9:53:20 AM By: Geralyn Corwin DO Entered By: Geralyn Corwin on 01/25/2023 09:27:24 -------------------------------------------------------------------------------- Chemical Cauterization Details Patient Name: Date of Service: [name] [name]. 01/25/2023 8:00 A M Medical Record Number: [id number] Patient Account Number: [id number]  Date of Birth/Sex: Treating RN: 01/31/1971 (52 y.o. [name] Primary Care Provider: Barbette Reichmann Other Clinician: Referring Provider: Treating Provider/Extender: Adolph Pollack Weeks in Treatment: 0 Procedure Performed for: Wound #2 Left,Posterior Lower Leg Performed By: Physician Geralyn Corwin, DO Post Procedure Diagnosis Same as Pre-procedure Electronic Signature(s) Signed: 01/25/2023 9:53:20 AM By: Geralyn Corwin DO Signed: 01/25/2023 2:18:28 PM By: Karie Schwalbe RN Entered By: Karie Schwalbe on 01/25/2023 09:20:28 [name] ([id number]) [id number] Page 3 of 10 -------------------------------------------------------------------------------- Physical Exam Details Patient Name: Date of Service: [name] [name]. 01/25/2023 8:00 A M Medical Record Number: [id number] Patient Account Number: [id number] Date of Birth/Sex: Treating RN: [birth year] (52 y.o. M) Primary Care Provider: Barbette Reichmann Other Clinician: Referring Provider: Treating Provider/Extender: Marcha Dutton, Vishwanath Weeks in Treatment: 0 Constitutional respirations regular, non-labored and within target range for patient.. Cardiovascular 2+ dorsalis pedis/posterior tibialis pulses. Psychiatric pleasant and cooperative. Notes Right lower extremity: T the posterior  aspect there is an open wound with granulation tissue and nonviable tissue. 2+ pitting edema to the knee. No surrounding o signs of infection including increased warmth, erythema or purulent drainage. Left lower extremity: T the posterior aspect there is an open wound with hyper granulated tissue throughout. No signs of surrounding soft tissue infection o including increased warmth, erythema or purulent drainage. Electronic Signature(s) Signed: 01/25/2023 9:53:20 AM By: Geralyn Corwin DO Entered By: Geralyn Corwin on 01/25/2023 09:25:04 -------------------------------------------------------------------------------- Physician Orders Details Patient Name: Date of Service: [name] [name]. 01/25/2023 8:00 A M Medical Record Number: [id number] Patient Account Number: [id number] Date of Birth/Sex: Treating RN: [birth year] (52 y.o. [name] Primary Care Provider: Barbette Reichmann Other Clinician: Referring Provider: Treating Provider/Extender: Adolph Pollack Weeks in Treatment: 0 Verbal / Phone Orders: No Diagnosis Coding ICD-10 Coding Code Description [phone] Non-pressure chronic ulcer of other part of right lower leg with fat layer exposed L97.822 Non-pressure chronic ulcer of other part of left lower leg with fat layer exposed E11.622 Type 2 diabetes mellitus with other skin ulcer I87.313 Chronic venous hypertension (idiopathic) with ulcer of bilateral lower extremity Follow-up Appointments ppointment in 1 week. - Dr. Mikey Bussing Room 9 Friday July 19th at 9:30am Return A Anesthetic Wound #1 Right,Lateral Lower Leg (In clinic) Topical Lidocaine 4% applied to wound bed Wound #2 Left,Posterior Lower Leg (In clinic) Topical Lidocaine 4% applied to wound bed Bathing/ Shower/ Hygiene May shower with protection but do not get wound dressing(s) wet. Protect dressing(s) with water repellant cover (for example, large plastic [name], [name] ([id number])  [id number] Page 4 of 10 bag) or a cast cover and may then take shower. - Please do not get the legs wet. Please use cast protectors when showering. Cast protectors can be purchased from [employer], Medical supply store. Cost ranges from $17-$37 Edema Control - Lymphedema / SCD / Other Bilateral Lower Extremities Lymphedema Pumps. Use Lymphedema pumps on leg(s) 2-3 times a day for 45-60 minutes. If wearing any wraps or hose, do not remove them. Continue exercising as instructed. - Keep using the Lymphadema Pumps.You may place the th Lyphadema pumps over the compression wraps. Elevate legs to the level of the heart or above for 30 minutes daily and/or when sitting for 3-4 times a day throughout the day. Avoid standing for long periods of time. Exercise regularly - As tolerated Off-Loading Other: - Keep legs elevated to heart level or above when sitting. You may place a pillow behind the calf for comfort, if so desired.  Wound Treatment Wound #1 - Lower Leg Wound Laterality: Right, Lateral Cleanser: Soap and Water 1 x Per Week/30 Days Discharge Instructions: May shower and wash wound with dial antibacterial soap and water prior to dressing change. Cleanser: Vashe 5.8 (oz) 1 x Per Week/30 Days Discharge Instructions: Cleanse the wound with Vashe prior to applying a clean dressing using gauze sponges, not tissue or cotton balls. Peri-Wound Care: Sween Lotion (Moisturizing lotion) 1 x Per Week/30 Days Discharge Instructions: Apply moisturizing lotion as directed Topical: Gentamicin 1 x Per Week/30 Days Discharge Instructions: As directed by physician Topical: Mupirocin Ointment 1 x Per Week/30 Days Discharge Instructions: Apply Mupirocin (Bactroban) as instructed Prim Dressing: Hydrofera Blue Ready Transfer Foam, 4x5 (in/in) 1 x Per Week/30 Days ary Discharge Instructions: Apply to wound bed as instructed Secondary Dressing: ABD Pad, 8x10 1 x Per  Week/30 Days Discharge Instructions: Apply over primary dressing as directed. Secondary Dressing: Woven Gauze Sponge, Non-Sterile 4x4 in 1 x Per Week/30 Days Discharge Instructions: Apply over primary dressing as directed. Compression Wrap: Urgo K2 Lite, (equivalent to a 3 layer) two layer compression system, regular 1 x Per Week/30 Days Discharge Instructions: or Apply Urgo K2 Lite as directed (alternative to 3 layer compression). Compression Wrap: ThreePress (3 layer compression wrap) 1 x Per Week/30 Days Discharge Instructions: Apply three layer compression as directed. Compression Wrap: Stockinette 1 x Per Week/30 Days Wound #2 - Lower Leg Wound Laterality: Left, Posterior Cleanser: Soap and Water 1 x Per Week/30 Days Discharge Instructions: May shower and wash wound with dial antibacterial soap and water prior to dressing change. Cleanser: Vashe 5.8 (oz) 1 x Per Week/30 Days Discharge Instructions: Cleanse the wound with Vashe prior to applying a clean dressing using gauze sponges, not tissue or cotton balls. Peri-Wound Care: Sween Lotion (Moisturizing lotion) 1 x Per Week/30 Days Discharge Instructions: Apply moisturizing lotion as directed Topical: Gentamicin 1 x Per Week/30 Days Discharge Instructions: As directed by physician Topical: Mupirocin Ointment 1 x Per Week/30 Days Discharge Instructions: Apply Mupirocin (Bactroban) as instructed Prim Dressing: Hydrofera Blue Ready Transfer Foam, 4x5 (in/in) 1 x Per Week/30 Days ary Discharge Instructions: Apply to wound bed as instructed Secondary Dressing: ABD Pad, 8x10 1 x Per Week/30 Days Discharge Instructions: Apply over primary dressing as directed. Secondary Dressing: Woven Gauze Sponge, Non-Sterile 4x4 in 1 x Per Week/30 Days Discharge Instructions: Apply over primary dressing as directed. Compression Wrap: Urgo K2 Lite, (equivalent to a 3 layer) two layer compression system, regular 1 x Per Week/30 Days Discharge Instructions:  or Apply Urgo K2 Lite as directed (alternative to 3 layer compression). Compression Wrap: ThreePress (3 layer compression wrap) 1 x Per Week/30 Days Discharge Instructions: Apply three layer compression as directed. [name], [name] ([id number]) [id number] Page 5 of 10 Compression Wrap: Stockinette 1 x Per Week/30 Days Electronic Signature(s) Signed: 01/25/2023 9:53:20 AM By: Geralyn Corwin DO Signed: 01/25/2023 2:18:28 PM By: Karie Schwalbe RN Entered By: Karie Schwalbe on 01/25/2023 09:25:41 -------------------------------------------------------------------------------- Problem List Details Patient Name: Date of Service: [name] [name]. 01/25/2023 8:00 A M Medical Record Number: [id number] Patient Account Number: [id number] Date of Birth/Sex: Treating RN: 03/15/1971 (52 y.o. M) Primary Care Provider: Barbette Reichmann Other Clinician: Referring Provider: Treating Provider/Extender: Adolph Pollack Weeks in Treatment: 0 Active Problems ICD-10 Encounter Code Description Active Date MDM Diagnosis L97.812 Non-pressure chronic ulcer of other part of right lower leg with fat layer 01/25/2023 No Yes exposed L97.822 Non-pressure chronic ulcer of other part of left lower  leg with fat layer exposed7/06/2023 No Yes E11.622 Type 2 diabetes mellitus with other skin ulcer 01/25/2023 No Yes I87.313 Chronic venous hypertension (idiopathic) with ulcer of bilateral lower extremity 01/25/2023 No Yes Inactive Problems Resolved Problems Electronic Signature(s) Signed: 01/25/2023 9:53:20 AM By: Geralyn Corwin DO Entered By: Geralyn Corwin on 01/25/2023 09:22:47 -------------------------------------------------------------------------------- Progress Note Details Patient Name: Date of Service: [name] [name]. 01/25/2023 8:00 A M Medical Record Number: [id number] Patient Account Number: [id number] Date of Birth/Sex: Treating  RN: Oct 10, 1970 (52 y.o. M) Primary Care Provider: Barbette Reichmann Other Clinician: Referring Provider: Treating Provider/Extender: Marcha Dutton, Vishwanath Weeks in TreatmentXAVI, [name] ([id number]) [id number] Page 6 of 10 Subjective Chief Complaint Information obtained from Patient 01/25/2023; bilateral lower extremity wounds History of Present Illness (HPI) 01/25/2023 Mr. Altonio Kuznia is a 52 year old male with a past medical history of controlled type 2 diabetes on insulin, venous insufficiency and DVT that presents to the clinic for bilateral lower extremity wounds. He states that the wound on the left was started by a bug bite and has been present for the past 4 months. The right lower extremity wound he states started out as a soft tissue infection and has been present for the past 6 years. He has been treated by a wound care center in [place] [facility] in [place]. He was last seen in the wound care center 2 years ago Could not continue following up due to lack of transportation. He states that recently the rehab center has been wrapping his legs with compression wrap and a wound dressing underneath. It is unclear when the last time this happened was. He is not wearing any compression today. He currently denies signs of infection. Patient History Information obtained from Patient. Allergies No Known Drug Allergies Family History Heart Disease, Hypertension - Mother,Siblings, Kidney Disease - Father,Siblings, Stroke - Mother, No family history of Diabetes. Social History Never smoker, Marital Status - Single, Alcohol Use - Never, Drug Use - No History, Caffeine Use - Moderate - soda,coffee. Medical History Hematologic/Lymphatic Patient has history of Anemia Cardiovascular Patient has history of Deep Vein Thrombosis - both leg, Hypertension, Peripheral Venous Disease Endocrine Patient has history of Type II  Diabetes - HgbA1c 6.2 (5/24) Musculoskeletal Patient has history of Gout Patient is treated with Insulin. Blood sugar is tested. Blood sugar results noted at the following times: Breakfast - 97. Hospitalization/Surgery History - scrotal surgery. - right elbow surgery. - right elbow septic arthritis IandD x2 with skin graft. - hernia repair 2022. - 2018 Ecoli sepsis. - necrotizing fasciitis. Medical A Surgical History Notes nd Constitutional Symptoms (General Health) CVA Cardiovascular cellulitis right leg 2018 Genitourinary CKD stage 2 Review of Systems (ROS) Integumentary (Skin) Complains or has symptoms of Wounds - BLE. Objective Constitutional respirations regular, non-labored and within target range for patient.. Vitals Time Taken: 8:14 AM, Height: 70 in, Weight: 264 lbs, BMI: 37.9, Temperature: 98.8 F, Pulse: 66 bpm, Respiratory Rate: 16 breaths/min, Blood Pressure: 138/80 mmHg. Cardiovascular 2+ dorsalis pedis/posterior tibialis pulses. Psychiatric pleasant and cooperative. General Notes: Right lower extremity: T the posterior aspect there is an open wound with granulation tissue and nonviable tissue. 2+ pitting edema to the knee. o No surrounding signs of infection including increased warmth, erythema or purulent drainage. Left lower extremity: T the posterior aspect there is an open o wound with hyper granulated tissue throughout. No signs of surrounding soft tissue infection including increased warmth, erythema or purulent drainage. Integumentary (Hair, Skin) Wound #1 status  is Open. Original cause of wound was Other Lesion. The date acquired was: 01/01/2018. The wound is located on the Right,Lateral Lower Leg. The wound measures 6.4cm length x 2.6cm width x 0.4cm depth; 13.069cm^2 area and 5.228cm^3 volume. There is Fat Layer (Subcutaneous Tissue) exposed. There is no tunneling or undermining noted. There is a medium amount of serosanguineous drainage noted. There is large  (67-100%) red, pink granulation within CALLISTER, KIESCHNICK Price ([id number]) [phone].pdf Page 7 of 10 the wound bed. There is a small (1-33%) amount of necrotic tissue within the wound bed including Eschar and Adherent Slough. The periwound skin appearance did not exhibit: Callus, Crepitus, Excoriation, Induration, Rash, Scarring, Dry/Scaly, Maceration, Atrophie Blanche, Cyanosis, Ecchymosis, Hemosiderin Staining, Mottled, Pallor, Rubor, Erythema. Periwound temperature was noted as No Abnormality. Wound #2 status is Open. Original cause of wound was Bite. The date acquired was: 09/14/2022. The wound is located on the Left,Posterior Lower Leg. The wound measures 2.1cm length x 2cm width x 0.1cm depth; 3.299cm^2 area and 0.33cm^3 volume. There is Fat Layer (Subcutaneous Tissue) exposed. There is no tunneling or undermining noted. There is a medium amount of serosanguineous drainage noted. There is large (67-100%) red, friable granulation within the wound bed. There is a small (1-33%) amount of necrotic tissue within the wound bed including Adherent Slough. The periwound skin appearance did not exhibit: Callus, Crepitus, Excoriation, Induration, Rash, Scarring, Dry/Scaly, Maceration, Atrophie Blanche, Cyanosis, Ecchymosis, Hemosiderin Staining, Mottled, Pallor, Rubor, Erythema. Periwound temperature was noted as No Abnormality. Assessment Active Problems ICD-10 Non-pressure chronic ulcer of other part of right lower leg with fat layer exposed Non-pressure chronic ulcer of other part of left lower leg with fat layer exposed Type 2 diabetes mellitus with other skin ulcer Chronic venous hypertension (idiopathic) with ulcer of bilateral lower extremity Patient presents with bilateral lower extremity wounds and states that the left wound was started by a bug bite and the right lower extremity wound was caused by soft tissue infection. Wounds are nonhealing due to venous insufficiency  and type 2 diabetes. No signs of infection. He has strong pedal pulses and ABIs in office were 0.94 on the right and 0.88 on the left suggesting adequate blood flow for healing. T the right lower extremity I debrided nonviable tissue o and recommended antibiotic ointment and Hydrofera Blue under 3 layer compression. T the left lower extremity I used silver nitrate to the hyper granulated o areas and recommended Hydrofera Blue under 3 layer compression. He knows to not keep the wraps on for more than a week or get them wet. He states he can follow in our clinic weekly. Procedures Wound #1 Pre-procedure diagnosis of Wound #1 is a Diabetic Wound/Ulcer of the Lower Extremity located on the Right,Lateral Lower Leg .Severity of Tissue Pre Debridement is: Fat layer exposed. There was a Excisional Skin/Subcutaneous Tissue Debridement with a total area of 13.06 sq cm performed by Geralyn Corwin, DO. With the following instrument(s): Curette to remove Viable and Non-Viable tissue/material. Material removed includes Subcutaneous Tissue and Slough and after achieving pain control using Lidocaine 4% T opical Solution. A time out was conducted at 09:15, prior to the start of the procedure. A Minimum amount of bleeding was controlled with Pressure. The procedure was tolerated well with a pain level of 0 throughout. Post Debridement Measurements: 6.4cm length x 2.6cm width x 0.4cm depth; 5.228cm^3 volume. Character of Wound/Ulcer Post Debridement is improved. Severity of Tissue Post Debridement is: Fat layer exposed. Post procedure Diagnosis Wound #1: Same as Pre-Procedure  General Notes: Scribed for Dr. Mikey Bussing by J.Scotton. Pre-procedure diagnosis of Wound #1 is a Diabetic Wound/Ulcer of the Lower Extremity located on the Right,Lateral Lower Leg . There was a Three Layer Compression Therapy Procedure by Karie Schwalbe, RN. Post procedure Diagnosis Wound #1: Same as Pre-Procedure Wound #2 Pre-procedure  diagnosis of Wound #2 is a Diabetic Wound/Ulcer of the Lower Extremity located on the Left,Posterior Lower Leg . There was a Three Layer Compression Therapy Procedure by Karie Schwalbe, RN. Post procedure Diagnosis Wound #2: Same as Pre-Procedure Pre-procedure diagnosis of Wound #2 is a Diabetic Wound/Ulcer of the Lower Extremity located on the Left,Posterior Lower Leg . An Chemical Cauterization procedure was performed by Geralyn Corwin, DO. Post procedure Diagnosis Wound #2: Same as Pre-Procedure Plan Follow-up Appointments: Return Appointment in 1 week. - Dr. Mikey Bussing Room 9 Anesthetic: Wound #1 Right,Lateral Lower Leg: (In clinic) Topical Lidocaine 4% applied to wound bed Wound #2 Left,Posterior Lower Leg: (In clinic) Topical Lidocaine 4% applied to wound bed Bathing/ Shower/ Hygiene: May shower with protection but do not get wound dressing(s) wet. Protect dressing(s) with water repellant cover (for example, large plastic bag) or a cast cover and may then take shower. - Please do not get the legs wet. Please use cast protectors when showering. Cast protectors can be purchased from [employer], Medical supply store. Cost ranges from $17-$37 Edema Control - Lymphedema / SCD / Other: Lymphedema Pumps. Use Lymphedema pumps on leg(s) 2-3 times a day for 45-60 minutes. If wearing any wraps or hose, do not remove them. Continue exercising as instructed. - Keep using the Lymphadema Pumps.You may place the th Lyphadema pumps over the compression wraps. Elevate legs to the level of the heart or above for 30 minutes daily and/or when sitting for 3-4 times a day throughout the day. Avoid standing for long periods of time. [name], [name] ([id number]) [id number] Page 8 of 10 Exercise regularly - As tolerated Off-Loading: Other: - Keep legs elevated to heart level or above when sitting. You may place a pillow behind the calf for comfort, if so  desired. WOUND #1: - Lower Leg Wound Laterality: Right, Lateral Cleanser: Soap and Water 1 x Per Week/30 Days Discharge Instructions: May shower and wash wound with dial antibacterial soap and water prior to dressing change. Cleanser: Vashe 5.8 (oz) 1 x Per Week/30 Days Discharge Instructions: Cleanse the wound with Vashe prior to applying a clean dressing using gauze sponges, not tissue or cotton balls. Peri-Wound Care: Sween Lotion (Moisturizing lotion) 1 x Per Week/30 Days Discharge Instructions: Apply moisturizing lotion as directed Topical: Gentamicin 1 x Per Week/30 Days Discharge Instructions: As directed by physician Topical: Mupirocin Ointment 1 x Per Week/30 Days Discharge Instructions: Apply Mupirocin (Bactroban) as instructed Prim Dressing: Hydrofera Blue Ready Transfer Foam, 4x5 (in/in) 1 x Per Week/30 Days ary Discharge Instructions: Apply to wound bed as instructed Secondary Dressing: ABD Pad, 8x10 1 x Per Week/30 Days Discharge Instructions: Apply over primary dressing as directed. Secondary Dressing: Woven Gauze Sponge, Non-Sterile 4x4 in 1 x Per Week/30 Days Discharge Instructions: Apply over primary dressing as directed. Com pression Wrap: Urgo K2 Lite, (equivalent to a 3 layer) two layer compression system, regular 1 x Per Week/30 Days Discharge Instructions: or Apply Urgo K2 Lite as directed (alternative to 3 layer compression). Com pression Wrap: ThreePress (3 layer compression wrap) 1 x Per Week/30 Days Discharge Instructions: Apply three layer compression as directed. Com pression Wrap: Stockinette 1 x Per Week/30 Days WOUND #2: - Lower  Leg Wound Laterality: Left, Posterior Cleanser: Soap and Water 1 x Per Week/30 Days Discharge Instructions: May shower and wash wound with dial antibacterial soap and water prior to dressing change. Cleanser: Vashe 5.8 (oz) 1 x Per Week/30 Days Discharge Instructions: Cleanse the wound with Vashe prior to applying a clean dressing  using gauze sponges, not tissue or cotton balls. Peri-Wound Care: Sween Lotion (Moisturizing lotion) 1 x Per Week/30 Days Discharge Instructions: Apply moisturizing lotion as directed Topical: Gentamicin 1 x Per Week/30 Days Discharge Instructions: As directed by physician Topical: Mupirocin Ointment 1 x Per Week/30 Days Discharge Instructions: Apply Mupirocin (Bactroban) as instructed Prim Dressing: Hydrofera Blue Ready Transfer Foam, 4x5 (in/in) 1 x Per Week/30 Days ary Discharge Instructions: Apply to wound bed as instructed Secondary Dressing: ABD Pad, 8x10 1 x Per Week/30 Days Discharge Instructions: Apply over primary dressing as directed. Secondary Dressing: Woven Gauze Sponge, Non-Sterile 4x4 in 1 x Per Week/30 Days Discharge Instructions: Apply over primary dressing as directed. Com pression Wrap: Urgo K2 Lite, (equivalent to a 3 layer) two layer compression system, regular 1 x Per Week/30 Days Discharge Instructions: or Apply Urgo K2 Lite as directed (alternative to 3 layer compression). Com pression Wrap: ThreePress (3 layer compression wrap) 1 x Per Week/30 Days Discharge Instructions: Apply three layer compression as directed. Com pression Wrap: Stockinette 1 x Per Week/30 Days 1. In office sharp debridement 2. Silver nitrate 3. Hydrofera Blue with antibiotic ointment under 3 layer compression to lower extremities bilaterally 4. Follow-up in 1 week Electronic Signature(s) Signed: 01/29/2023 6:16:33 PM By: Shawn Stall RN, BSN Signed: 01/31/2023 4:16:47 PM By: Geralyn Corwin DO Previous Signature: 01/25/2023 9:53:20 AM Version By: Geralyn Corwin DO Entered By: Shawn Stall on 01/29/2023 18:14:55 -------------------------------------------------------------------------------- HxROS Details Patient Name: Date of Service: [name] [name]. 01/25/2023 8:00 A M Medical Record Number: [id number] Patient Account Number: [id number] Date of Birth/Sex: Treating RN: 07-05-1971  (52 y.o. [name] Primary Care Provider: Barbette Reichmann Other Clinician: Referring Provider: Treating Provider/Extender: Adolph Pollack Weeks in Treatment: 0 Information Obtained From Patient Integumentary (Skin) Complaints and Symptoms: [name], [name] ([id number]) [phone].pdf Page 9 of 10 Positive for: Wounds - BLE Constitutional Symptoms (General Health) Medical History: Past Medical History Notes: CVA Hematologic/Lymphatic Medical History: Positive for: Anemia Cardiovascular Medical History: Positive for: Deep Vein Thrombosis - both leg; Hypertension; Peripheral Venous Disease Past Medical History Notes: cellulitis right leg 2018 Endocrine Medical History: Positive for: Type II Diabetes - HgbA1c 6.2 (5/24) Time with diabetes: 9 years Treated with: Insulin Blood sugar tested every day: Yes Tested : 3 x day Blood sugar testing results: Breakfast: 97 Genitourinary Medical History: Past Medical History Notes: CKD stage 2 Musculoskeletal Medical History: Positive for: Gout Immunizations Pneumococcal Vaccine: Received Pneumococcal Vaccination: No Implantable Devices None Hospitalization / Surgery History Type of Hospitalization/Surgery scrotal surgery right elbow surgery right elbow septic arthritis IandD x2 with skin graft hernia repair 2022 2018 Ecoli sepsis necrotizing fasciitis Family and Social History Diabetes: No; Heart Disease: Yes; Hypertension: Yes - Mother,Siblings; Kidney Disease: Yes - Father,Siblings; Stroke: Yes - Mother; Never smoker; Marital Status - Single; Alcohol Use: Never; Drug Use: No History; Caffeine Use: Moderate - soda,coffee; Financial Concerns: No; Food, Clothing or Shelter Needs: No; Support System Lacking: No; Transportation Concerns: No Electronic Signature(s) Signed: 01/25/2023 9:53:20 AM By: Geralyn Corwin DO Signed: 01/25/2023 1:53:12 PM By: Shawn Stall RN,  BSN Signed: 01/25/2023 2:18:28 PM By: Karie Schwalbe RN Entered By: Karie Schwalbe on 01/25/2023 08:21:55  [name], [name] ([id number]) [id number] Page 10 of 10 -------------------------------------------------------------------------------- SuperBill Details Patient Name: Date of Service: [name] [name]. 01/25/2023 Medical Record Number: [id number] Patient Account Number: [id number] Date of Birth/Sex: Treating RN: 1970/08/06 (52 y.o. M) Primary Care Provider: Barbette Reichmann Other Clinician: Referring Provider: Treating Provider/Extender: Marcha Dutton, Vishwanath Weeks in Treatment: 0 Diagnosis Coding ICD-10 Codes Code Description [id number] Non-pressure chronic ulcer of other part of right lower leg with fat layer exposed L97.822 Non-pressure chronic ulcer of other part of left lower leg with fat layer exposed E11.622 Type 2 diabetes mellitus with other skin ulcer I87.313 Chronic venous hypertension (idiopathic) with ulcer of bilateral lower extremity Facility Procedures : CPT4 Code: 32440102 Description: 99214 - WOUND CARE VISIT-LEV 4 EST PT Modifier: Quantity: 1 : CPT4 Code: 72536644 Description: 11042 - DEB SUBQ TISSUE 20 SQ CM/< ICD-10 Diagnosis Description L97.812 Non-pressure chronic ulcer of other part of right lower leg with fat layer expo E11.622 Type 2 diabetes mellitus with other skin ulcer I87.313 Chronic venous  hypertension (idiopathic) with ulcer of bilateral lower extremit Modifier: sed y Quantity: 1 : CPT4 Code: 03474259 Description: 17250 - CHEM CAUT GRANULATION TISS ICD-10 Diagnosis Description L97.822 Non-pressure chronic ulcer of other part of left lower leg with fat layer expos E11.622 Type 2 diabetes mellitus with other skin ulcer I87.313 Chronic venous  hypertension (idiopathic) with ulcer of bilateral lower extremit Modifier: ed y Quantity: 1 Physician Procedures : CPT4 Code Description Modifier [id number]  99204 - WC PHYS LEVEL 4 - NEW PT 25 ICD-10 Diagnosis Description L97.812 Non-pressure chronic ulcer of other part of right lower leg with fat layer exposed L97.822 Non-pressure chronic ulcer of other part of left  lower leg with fat layer exposed E11.622 Type 2 diabetes mellitus with other skin ulcer I87.313 Chronic venous hypertension (idiopathic) with ulcer of bilateral lower extremity Quantity: 1 : [id number] 11042 - WC PHYS SUBQ TISS 20 SQ CM ICD-10 Diagnosis Description L97.812 Non-pressure chronic ulcer of other part of right lower leg with fat layer exposed E11.622 Type 2 diabetes mellitus with other skin ulcer I87.313 Chronic venous  hypertension (idiopathic) with ulcer of bilateral lower extremity Quantity: 1 : [id number] 17250 - WC PHYS CHEM CAUT GRAN TISSUE ICD-10 Diagnosis Description L97.822 Non-pressure chronic ulcer of other part of left lower leg with fat layer exposed E11.622 Type 2 diabetes mellitus with other skin ulcer I87.313 Chronic venous  hypertension (idiopathic) with ulcer of bilateral lower extremity Quantity: 1 Electronic Signature(s) Signed: 01/25/2023 2:18:28 PM By: Karie Schwalbe RN Signed: 01/29/2023 4:51:26 PM By: Geralyn Corwin DO Previous Signature: 01/25/2023 9:53:20 AM Version By: Geralyn Corwin DO Entered By: Karie Schwalbe on 01/25/2023 13:58:14

## 2023-02-01 ENCOUNTER — Encounter (HOSPITAL_BASED_OUTPATIENT_CLINIC_OR_DEPARTMENT_OTHER): Payer: Medicaid Other | Admitting: Internal Medicine

## 2023-02-01 DIAGNOSIS — L97822 Non-pressure chronic ulcer of other part of left lower leg with fat layer exposed: Secondary | ICD-10-CM | POA: Diagnosis not present

## 2023-02-01 DIAGNOSIS — Z8249 Family history of ischemic heart disease and other diseases of the circulatory system: Secondary | ICD-10-CM | POA: Diagnosis not present

## 2023-02-01 DIAGNOSIS — E11622 Type 2 diabetes mellitus with other skin ulcer: Secondary | ICD-10-CM

## 2023-02-01 DIAGNOSIS — I129 Hypertensive chronic kidney disease with stage 1 through stage 4 chronic kidney disease, or unspecified chronic kidney disease: Secondary | ICD-10-CM | POA: Diagnosis not present

## 2023-02-01 DIAGNOSIS — L97812 Non-pressure chronic ulcer of other part of right lower leg with fat layer exposed: Secondary | ICD-10-CM

## 2023-02-01 DIAGNOSIS — I87313 Chronic venous hypertension (idiopathic) with ulcer of bilateral lower extremity: Secondary | ICD-10-CM

## 2023-02-01 DIAGNOSIS — Z794 Long term (current) use of insulin: Secondary | ICD-10-CM | POA: Diagnosis not present

## 2023-02-01 DIAGNOSIS — Z86718 Personal history of other venous thrombosis and embolism: Secondary | ICD-10-CM | POA: Diagnosis not present

## 2023-02-01 DIAGNOSIS — N182 Chronic kidney disease, stage 2 (mild): Secondary | ICD-10-CM | POA: Diagnosis not present

## 2023-02-01 DIAGNOSIS — E1122 Type 2 diabetes mellitus with diabetic chronic kidney disease: Secondary | ICD-10-CM | POA: Diagnosis not present

## 2023-02-03 NOTE — Progress Notes (Signed)
TAI, SKELLY ([id number]) [id number] Page 1 of 8 Visit Report for 02/01/2023 Arrival Information Details Patient Name: Date of Service: [name]. 02/01/2023 9:30 A M Medical Record Number: [id number] Patient Account Number: [id number] Date of Birth/Sex: Treating RN: Oct 23, 1970 (52 y.o. [name] Primary Care Amyre Segundo: Barbette Reichmann Other Clinician: Referring Sylvio Weatherall: Treating Telford Archambeau/Extender: Adolph Pollack Weeks in Treatment: 1 Visit Information History Since Last Visit Added or deleted any medications: No Patient Arrived: Ambulatory Any new allergies or adverse reactions: No Arrival Time: 09:34 Had a fall or experienced change in No Accompanied By: self activities of daily living that may affect Transfer Assistance: None risk of falls: Patient Identification Verified: Yes Signs or symptoms of abuse/neglect since last visito No Secondary Verification Process Completed: Yes Hospitalized since last visit: No Patient Requires Transmission-Based Precautions: No Implantable device outside of the clinic excluding No Patient Has Alerts: No cellular tissue based products placed in the center since last visit: Has Dressing in Place as Prescribed: Yes Has Compression in Place as Prescribed: Yes Pain Present Now: No Notes unable to upload photos downtime. Electronic Signature(s) Signed: 02/03/2023 1:54:09 PM By: Shawn Stall RN, BSN Entered By: Shawn Stall on 02/03/2023 10:48:37 -------------------------------------------------------------------------------- Compression Therapy Details Patient Name: Date of Service: [name] [name] Bulla Price. 02/01/2023 9:30 A M Medical Record Number: [id number] Patient Account Number: [id number] Date of Birth/Sex: Treating RN: 15-Sep-1970 (52 y.o. [name] Primary Care Zebbie Ace: Barbette Reichmann Other Clinician: Referring Bessy Reaney: Treating Banessa Mao/Extender:  Adolph Pollack Weeks in Treatment: 1 Compression Therapy Performed for Wound Assessment: Wound #1 Right,Lateral Lower Leg Performed By: Clinician Karie Schwalbe, RN Compression Type: Double Layer Post Procedure Diagnosis Same as Pre-procedure Electronic Signature(s) Signed: 02/03/2023 1:54:09 PM By: Shawn Stall RN, BSN Entered By: Shawn Stall on 02/03/2023 10:51:08 [name] ([id number]) [id number] Page 2 of 8 -------------------------------------------------------------------------------- Compression Therapy Details Patient Name: Date of Service: [name]. 02/01/2023 9:30 A M Medical Record Number: [id number] Patient Account Number: [id number] Date of Birth/Sex: Treating RN: 11-11-1970 (52 y.o. [name] Primary Care Raylan Troiani: Barbette Reichmann Other Clinician: Referring Tamekia Rotter: Treating Jameeka Marcy/Extender: Adolph Pollack Weeks in Treatment: 1 Compression Therapy Performed for Wound Assessment: Wound #2 Left,Posterior Lower Leg Performed By: Clinician Karie Schwalbe, RN Compression Type: Double Layer Post Procedure Diagnosis Same as Pre-procedure Electronic Signature(s) Signed: 02/03/2023 1:54:09 PM By: Shawn Stall RN, BSN Entered By: Shawn Stall on 02/03/2023 10:51:08 -------------------------------------------------------------------------------- Encounter Discharge Information Details Patient Name: Date of Service: [name] [name] Bulla Price. 02/01/2023 9:30 A M Medical Record Number: [id number] Patient Account Number: [id number] Date of Birth/Sex: Treating RN: 04/28/1971 (52 y.o. [name] Primary Care Neosha Switalski: Barbette Reichmann Other Clinician: Referring Denny Mccree: Treating Barbar Brede/Extender: Adolph Pollack Weeks in Treatment: 1 Encounter Discharge Information Items Post Procedure Vitals Discharge Condition: Stable Temperature (F):  98.3 Ambulatory Status: Ambulatory Pulse (bpm): 65 Discharge Destination: Home Respiratory Rate (breaths/min): 16 Transportation: Private Auto Blood Pressure (mmHg): 145/83 Accompanied By: self Schedule Follow-up Appointment: Yes Clinical Summary of Care: Electronic Signature(s) Signed: 02/03/2023 1:54:09 PM By: Shawn Stall RN, BSN Entered By: Shawn Stall on 02/03/2023 10:54:07 -------------------------------------------------------------------------------- Lower Extremity Assessment Details Patient Name: Date of Service: [name] [name] Bulla Price. 02/01/2023 9:30 A M Medical Record Number: [id number] Patient Account Number: [id number] Date of Birth/Sex: Treating RN: [birth year] (52 y.o. [name] Primary Care Megin Consalvo: Barbette Reichmann Other Clinician: Referring Olga Seyler: Treating Stephanieann Popescu/Extender: Marcha Dutton, Vishwanath Weeks in Treatment: 1 Edema  Assessment Left: [Left: Right] [Right: :] Assessed: [Left: Yes] [Right: Yes] Edema: [Left: Yes] [Right: Yes] Calf Left: Right: Point of Measurement: 34 cm From Medial Instep 33 cm 33 cm Ankle Left: Right: Point of Measurement: 10 cm From Medial Instep 22 cm 22 cm Vascular Assessment Pulses: Dorsalis Pedis Palpable: [Left:Yes] [Right:Yes] Extremity colors, hair growth, and conditions: Extremity Color: [Left:Normal] [Right:Normal] Hair Growth on Extremity: [Left:No] [Right:No] Temperature of Extremity: [Left:Warm] [Right:Warm] Capillary Refill: [Left:< 3 seconds] [Right:< 3 seconds] Dependent Rubor: [Left:No No] [Right:No No] Electronic Signature(s) Signed: 02/03/2023 1:54:09 PM By: Shawn Stall RN, BSN Entered By: Shawn Stall on 02/03/2023 10:49:17 -------------------------------------------------------------------------------- Multi-Disciplinary Care Plan Details Patient Name: Date of Service: [name] [name] Bulla Price. 02/01/2023 9:30 A M Medical Record Number: [id number] Patient Account Number:  [id number] Date of Birth/Sex: Treating RN: [birth year] (52 y.o. [name] Primary Care Alexsys Eskin: Barbette Reichmann Other Clinician: Referring Delsin Copen: Treating Abrey Bradway/Extender: Adolph Pollack Weeks in Treatment: 1 Active Inactive Wound/Skin Impairment Nursing Diagnoses: Impaired tissue integrity Goals: Patient/caregiver will verbalize understanding of skin care regimen Date Initiated: 01/25/2023 Target Resolution Date: 05/16/2023 Goal Status: Active Interventions: Assess ulceration(s) every visit Treatment Activities: Skin care regimen initiated : 01/25/2023 Notes: Electronic Signature(s) Signed: 02/03/2023 1:54:09 PM By: Shawn Stall RN, BSN Entered By: Shawn Stall on 02/03/2023 10:52:58 [name] ([id number]) [id number] Page 4 of 8 -------------------------------------------------------------------------------- Pain Assessment Details Patient Name: Date of Service: [name]. 02/01/2023 9:30 A M Medical Record Number: [id number] Patient Account Number: [id number] Date of Birth/Sex: Treating RN: Sep 03, 1970 (52 y.o. [name] Primary Care Kalob Bergen: Barbette Reichmann Other Clinician: Referring Semaj Kham: Treating Osamah Schmader/Extender: Adolph Pollack Weeks in Treatment: 1 Active Problems Location of Pain Severity and Description of Pain Patient Has Paino No Site Locations Pain Management and Medication Current Pain Management: Electronic Signature(s) Signed: 02/03/2023 1:54:09 PM By: Shawn Stall RN, BSN Entered By: Shawn Stall on 02/03/2023 10:48:52 -------------------------------------------------------------------------------- Patient/Caregiver Education Details Patient Name: Date of Service: [name] 7/19/2024andnbsp9:30 A M Medical Record Number: [id number] Patient Account Number: [id number] Date of Birth/Gender: Treating RN: 08-04-1970 (52 y.o. [name] Primary Care Physician: Barbette Reichmann Other Clinician: Referring Physician: Treating Physician/Extender: Adolph Pollack Weeks in Treatment: 1 Education Assessment Education Provided To: Patient Education Topics Provided Venous: Handouts: Controlling Swelling with Compression Stockings Methods: Explain/Verbal Responses: Reinforcements needed [name], [name] ([id number]) [id number] Page 5 of 8 Electronic Signature(s) Signed: 02/03/2023 1:54:09 PM By: Shawn Stall RN, BSN Entered By: Shawn Stall on 02/03/2023 10:53:12 -------------------------------------------------------------------------------- Wound Assessment Details Patient Name: Date of Service: [name] [name] Bulla Price. 02/01/2023 9:30 A M Medical Record Number: [id number] Patient Account Number: [id number] Date of Birth/Sex: Treating RN: Apr 24, 1971 (52 y.o. [name] Primary Care Achsah Mcquade: Barbette Reichmann Other Clinician: Referring Khloi Rawl: Treating Kenyatte Gruber/Extender: Marcha Dutton, Vishwanath Weeks in Treatment: 1 Wound Status Wound Number: 1 Primary Diabetic Wound/Ulcer of the Lower Extremity Etiology: Wound Location: Right, Lateral Lower Leg Wound Open Wounding Event: Other Lesion Status: Date Acquired: 01/01/2018 Notes: Pt. 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 Weeks Of Treatment: 1 Comorbid Anemia, Deep Vein Thrombosis, Hypertension, Peripheral Clustered Wound: No History: Venous Disease, Type II Diabetes, Gout Wound Measurements Length: (cm) 5.9 Width: (cm) 2.1 Depth: (cm) 0.3 Area: (cm) 9.731 Volume: (cm) 2.919 % Reduction in Area: 25.5% % Reduction in Volume: 44.2% Epithelialization: Small (1-33%) Tunneling: No Undermining: No Wound Description Classification: Grade 2 Wound Margin: Distinct, outline attached Exudate Amount: Medium Exudate  Type: Serosanguineous Exudate Color: red, brown Foul Odor After  Cleansing: No Slough/Fibrino Yes Wound Bed Granulation Amount: Large (67-100%) Exposed Structure Granulation Quality: Red, Pink Fascia Exposed: No Necrotic Amount: Small (1-33%) Fat Layer (Subcutaneous Tissue) Exposed: Yes Necrotic Quality: Adherent Slough Tendon Exposed: No Muscle Exposed: No Joint Exposed: No Bone Exposed: No Periwound Skin Texture Texture Color No Abnormalities Noted: No No Abnormalities Noted: No Callus: No Atrophie Blanche: No Crepitus: No Cyanosis: No Excoriation: No Ecchymosis: No Induration: No Erythema: No Rash: No Hemosiderin Staining: Yes Scarring: No Mottled: No Pallor: No Moisture Rubor: No No Abnormalities Noted: No Dry / Scaly: No Temperature / Pain Maceration: No Temperature: No Abnormality Treatment Notes Wound #1 (Lower Leg) Wound Laterality: Right, Lateral Cleanser [name], [name] ([id number]) [id number] Page 6 of 8 Soap and Water Discharge Instruction: May shower and wash wound with dial antibacterial soap and water prior to dressing change. Vashe 5.8 (oz) Discharge Instruction: Cleanse the wound with Vashe prior to applying a clean dressing using gauze sponges, not tissue or cotton balls. Peri-Wound Care Sween Lotion (Moisturizing lotion) Discharge Instruction: Apply moisturizing lotion as directed Topical Gentamicin Discharge Instruction: As directed by physician Mupirocin Ointment Discharge Instruction: Apply Mupirocin (Bactroban) as instructed Primary Dressing Hydrofera Blue Ready Transfer Foam, 4x5 (in/in) Discharge Instruction: Apply to wound bed as instructed Secondary Dressing ABD Pad, 8x10 Discharge Instruction: Apply over primary dressing as directed. Woven Gauze Sponge, Non-Sterile 4x4 in Discharge Instruction: Apply over primary dressing as directed. Secured With Compression Wrap Urgo K2 Lite, (equivalent to a 3 layer) two layer compression system, regular Discharge  Instruction: or Apply Urgo K2 Lite as directed (alternative to 3 layer compression). ThreePress (3 layer compression wrap) Discharge Instruction: Apply three layer compression as directed. Stockinette Compression Stockings Facilities manager) Signed: 02/03/2023 1:54:09 PM By: Shawn Stall RN, BSN Entered By: Shawn Stall on 02/03/2023 10:50:01 -------------------------------------------------------------------------------- Wound Assessment Details Patient Name: Date of Service: [name] [name] Bulla Price. 02/01/2023 9:30 A M Medical Record Number: [id number] Patient Account Number: [id number] Date of Birth/Sex: Treating RN: 09-24-1970 (52 y.o. [name] Primary Care [name]: Barbette Reichmann Other Clinician: Referring Reis Goga: Treating Teigan Sahli/Extender: Adolph Pollack Weeks in Treatment: 1 Wound Status Wound Number: 2 Primary Diabetic Wound/Ulcer of the Lower Extremity Etiology: Wound Location: Left, Posterior Lower Leg Wound Open Wounding Event: Bite Status: Date Acquired: 09/14/2022 Comorbid Anemia, Deep Vein Thrombosis, Hypertension, Peripheral Weeks Of Treatment: 1 History: Venous Disease, Type II Diabetes, Gout Clustered Wound: No Wound Measurements Length: (cm) 1 Width: (cm) 1.3 Depth: (cm) 0.1 Area: (cm) 1.021 Volume: (cm) 0.102 [name], [name] ([id number]) Wound Description Classification: Grade 1 Wound Margin: Distinct, outline attached Exudate Amount: Medium Exudate Type: Serosanguineous Exudate Color: red, brown Foul Odor After Cleansing: No Slough/Fibrino Yes % Reduction in Area: 69.1% % Reduction in Volume: 69.1% Epithelialization: Small (1-33%) Tunneling: No Undermining: No [id number] Page 7 of 8 Wound Bed Granulation Amount: Large (67-100%) Exposed Structure Granulation Quality: Red Fascia Exposed: No Necrotic Amount: Small (1-33%) Fat Layer (Subcutaneous Tissue) Exposed:  Yes Necrotic Quality: Adherent Slough Tendon Exposed: No Muscle Exposed: No Joint Exposed: No Bone Exposed: No Periwound Skin Texture Texture Color No Abnormalities Noted: No No Abnormalities Noted: No Callus: No Atrophie Blanche: No Crepitus: No Cyanosis: No Excoriation: No Ecchymosis: No Induration: No Erythema: No Rash: No Hemosiderin Staining: Yes Scarring: No Mottled: No Pallor: No Moisture Rubor: No No Abnormalities Noted: No Dry / Scaly: No Temperature / Pain Maceration: No Temperature: No Abnormality Treatment Notes Wound #2 (  Lower Leg) Wound Laterality: Left, Posterior Cleanser Soap and Water Discharge Instruction: May shower and wash wound with dial antibacterial soap and water prior to dressing change. Vashe 5.8 (oz) Discharge Instruction: Cleanse the wound with Vashe prior to applying a clean dressing using gauze sponges, not tissue or cotton balls. Peri-Wound Care Sween Lotion (Moisturizing lotion) Discharge Instruction: Apply moisturizing lotion as directed Topical Gentamicin Discharge Instruction: As directed by physician Mupirocin Ointment Discharge Instruction: Apply Mupirocin (Bactroban) as instructed Primary Dressing Hydrofera Blue Ready Transfer Foam, 4x5 (in/in) Discharge Instruction: Apply to wound bed as instructed Secondary Dressing ABD Pad, 8x10 Discharge Instruction: Apply over primary dressing as directed. Woven Gauze Sponge, Non-Sterile 4x4 in Discharge Instruction: Apply over primary dressing as directed. Secured With Compression Wrap Urgo K2 Lite, (equivalent to a 3 layer) two layer compression system, regular Discharge Instruction: or Apply Urgo K2 Lite as directed (alternative to 3 layer compression). ThreePress (3 layer compression wrap) Discharge Instruction: Apply three layer compression as directed. Stockinette Compression Stockings Add-Ons [name], [name] ([id number]) [id number] Page 8 of  8 Electronic Signature(s) Signed: 02/03/2023 1:54:09 PM By: Shawn Stall RN, BSN Entered By: Shawn Stall on 02/03/2023 10:50:36 -------------------------------------------------------------------------------- Vitals Details Patient Name: Date of Service: [name] [name] Bulla Price. 02/01/2023 9:30 A M Medical Record Number: [id number] Patient Account Number: [id number] Date of Birth/Sex: Treating RN: 12-14-1970 (52 y.o. [name] Primary Care Treanna Dumler: Barbette Reichmann Other Clinician: Referring Dhilan Brauer: Treating Chales Pelissier/Extender: Adolph Pollack Weeks in Treatment: 1 Vital Signs Time Taken: 09:34 Temperature (F): 98.3 Height (in): 70 Pulse (bpm): 65 Weight (lbs): 264 Respiratory Rate (breaths/min): 16 Body Mass Index (BMI): 37.9 Blood Pressure (mmHg): 145/83 Reference Range: 80 - 120 mg / dl Electronic Signature(s) Signed: 02/03/2023 1:54:09 PM By: Shawn Stall RN, BSN Entered By: Shawn Stall on 02/03/2023 10:48:48

## 2023-02-05 NOTE — Progress Notes (Signed)
[name], [name] ([id number]) [id number] Page 1 of 10 Visit Report for 02/01/2023 Chief Complaint Document Details Patient Name: Date of Service: [name]. 02/01/2023 9:30 A M Medical Record Number: [id number] Patient Account Number: [id number] Date of Birth/Sex: Treating RN: 1970/08/25 (52 y.o. M) Primary Care Provider: Barbette Reichmann Other Clinician: Referring Provider: Treating Provider/Extender: Adolph Pollack Weeks in Treatment: 1 Information Obtained from: Patient Chief Complaint 01/25/2023; bilateral lower extremity wounds Electronic Signature(s) Signed: 02/05/2023 4:26:53 PM By: Geralyn Corwin DO Entered By: Geralyn Corwin on 02/04/2023 15:25:27 -------------------------------------------------------------------------------- Debridement Details Patient Name: Date of Service: [name] [name] Bulla K. 02/01/2023 9:30 A M Medical Record Number: [id number] Patient Account Number: [id number] Date of Birth/Sex: Treating RN: [birth year] (52 y.o. [name] Primary Care Provider: Barbette Reichmann Other Clinician: Referring Provider: Treating Provider/Extender: Adolph Pollack Weeks in Treatment: 1 Debridement Performed for Assessment: Wound #1 Right,Lateral Lower Leg Performed By: Physician Geralyn Corwin, DO Debridement Type: Debridement Severity of Tissue Pre Debridement: Fat layer exposed Level of Consciousness (Pre-procedure): Awake and Alert Pre-procedure Verification/Time Out Yes - 10:20 Taken: Start Time: 10:21 Pain Control: Lidocaine 4% T opical Solution Percent of Wound Bed Debrided: 100% T Area Debrided (cm): otal 9.73 Tissue and other material debrided: Viable, Non-Viable, Slough, Subcutaneous, Skin: Dermis , Skin: Epidermis, Slough Level: Skin/Subcutaneous Tissue Debridement Description: Excisional Instrument: Curette Bleeding: Minimum Hemostasis Achieved: Pressure End  Time: 10:30 Procedural Pain: 0 Post Procedural Pain: 0 Response to Treatment: Procedure was tolerated well Level of Consciousness (Post- Awake and Alert procedure): Post Debridement Measurements of Total Wound Length: (cm) 5.9 Width: (cm) 2.1 Depth: (cm) 0.3 Volume: (cm) 2.919 Character of Wound/Ulcer Post Debridement: Improved [name], [name] ([id number]) [id number] Page 2 of 10 Severity of Tissue Post Debridement: Fat layer exposed Post Procedure Diagnosis Same as Pre-procedure Electronic Signature(s) Signed: 02/05/2023 4:26:53 PM By: Geralyn Corwin DO Signed: 02/05/2023 4:56:09 PM By: Shawn Stall RN, BSN Entered By: Shawn Stall on 02/05/2023 14:18:41 -------------------------------------------------------------------------------- HPI Details Patient Name: Date of Service: [name] [name] Bulla K. 02/01/2023 9:30 A M Medical Record Number: [id number] Patient Account Number: [id number] Date of Birth/Sex: Treating RN: 07-03-1971 (51 y.o. M) Primary Care Provider: Barbette Reichmann Other Clinician: Referring Provider: Treating Provider/Extender: Adolph Pollack Weeks in Treatment: 1 History of Present Illness HPI Description: 01/25/2023 [name] is a 52 year old male with a past medical history of controlled type 2 diabetes on insulin, venous insufficiency and DVT that presents to the clinic for bilateral lower extremity wounds. He states that the wound on the left was started by a bug bite and has been present for the past 4 months. The right lower extremity wound he states started out as a soft tissue infection and has been present for the past 6 years. He has been treated by a wound care center in [facility] in [place]. He was last seen in the wound care center 2 years ago Could not continue following up due to lack of transportation. He states that recently the rehab center has been wrapping his  legs with compression wrap and a wound dressing underneath. It is unclear when the last time this happened was. He is not wearing any compression today. He currently denies signs of infection. 7/19; patient presents for follow-up. We have been using antibiotic ointment with Hydrofera Blue under 3 layer compression to the lower extremities bilaterally. Wounds are smaller. Electronic Signature(s) Signed: 02/05/2023 4:26:53 PM By: Geralyn Corwin DO Entered By: Geralyn Corwin  on 02/04/2023 15:26:43 -------------------------------------------------------------------------------- Chemical Cauterization Details Patient Name: Date of Service: [name]. 02/01/2023 9:30 A M Medical Record Number: [id number] Patient Account Number: [id number] Date of Birth/Sex: Treating RN: 04-24-1971 (52 y.o. [name] Primary Care Provider: Barbette Reichmann Other Clinician: Referring Provider: Treating Provider/Extender: Adolph Pollack Weeks in Treatment: 1 Procedure Performed for: Wound #2 Left,Posterior Lower Leg Performed By: Physician Geralyn Corwin, DO Post Procedure Diagnosis Same as Pre-procedure Notes silver nitrate used. Electronic Signature(s) Signed: 02/05/2023 4:26:53 PM By: Julien Nordmann ([id number]) [id number] Page 3 of 10 Signed: 02/05/2023 4:56:09 PM By: Shawn Stall RN, BSN Entered By: Shawn Stall on 02/04/2023 17:46:42 -------------------------------------------------------------------------------- Physical Exam Details Patient Name: Date of Service: [name] [name] Bulla K. 02/01/2023 9:30 A M Medical Record Number: [id number] Patient Account Number: [id number] Date of Birth/Sex: Treating RN: [birth year] (52 y.o. M) Primary Care Provider: Barbette Reichmann Other Clinician: Referring Provider: Treating Provider/Extender: Marcha Dutton, Vishwanath Weeks in Treatment:  1 Constitutional respirations regular, non-labored and within target range for patient.. Cardiovascular 2+ dorsalis pedis/posterior tibialis pulses. Psychiatric pleasant and cooperative. Notes Right lower extremity: T the posterior aspect there is an open wound with granulation tissue and nonviable tissue. Good edema control . No surrounding signs o of infection including increased warmth, erythema or purulent drainage. Left lower extremity: T the posterior aspect there is an open wound with hyper o granulated tissue throughout. No signs of surrounding soft tissue infection including increased warmth, erythema or purulent drainage. Electronic Signature(s) Signed: 02/05/2023 4:26:53 PM By: Geralyn Corwin DO Entered By: Geralyn Corwin on 02/04/2023 15:51:49 -------------------------------------------------------------------------------- Physician Orders Details Patient Name: Date of Service: [name] [name] Bulla K. 02/01/2023 9:30 A M Medical Record Number: [id number] Patient Account Number: [id number] Date of Birth/Sex: Treating RN: 11/19/1970 (52 y.o. [name] Primary Care Provider: Barbette Reichmann Other Clinician: Referring Provider: Treating Provider/Extender: Adolph Pollack Weeks in Treatment: 1 Verbal / Phone Orders: No Diagnosis Coding Follow-up Appointments ppointment in 1 week. - Dr. Mikey Bussing Room 9 Return A Anesthetic (In clinic) Topical Lidocaine 4% applied to wound bed Bathing/ Shower/ Hygiene May shower with protection but do not get wound dressing(s) wet. Protect dressing(s) with water repellant cover (for example, large plastic bag) or a cast cover and may then take shower. - Please do not get the legs wet. Please use cast protectors when showering. Cast protectors can be purchased from [employer], Medical supply store. Cost ranges from $17-$37 Edema Control - Lymphedema / SCD / Other Bilateral Lower  Extremities Lymphedema Pumps. Use Lymphedema pumps on leg(s) 2-3 times a day for 45-60 minutes. If wearing any wraps or hose, do not remove them. Continue exercising as instructed. - Keep using the Lymphadema Pumps.You may place the th Lyphadema pumps over the compression wraps. Elevate legs to the level of the heart or above for 30 minutes daily and/or when sitting for 3-4 times a day throughout the day. Avoid standing for long periods of time. JOSS, MCDILL ([id number]) [id number] Page 4 of 10 Exercise regularly - As tolerated Off-Loading Other: - Keep legs elevated to heart level or above when sitting. You may place a pillow behind the calf for comfort, if so desired. Wound Treatment Wound #1 - Lower Leg Wound Laterality: Right, Lateral Cleanser: Soap and Water 1 x Per Week/30 Days Discharge Instructions: May shower and wash wound with dial antibacterial soap and water prior to dressing change. Cleanser: Vashe 5.8 (oz) 1 x Per Week/30 Days  Discharge Instructions: Cleanse the wound with Vashe prior to applying a clean dressing using gauze sponges, not tissue or cotton balls. Peri-Wound Care: Sween Lotion (Moisturizing lotion) 1 x Per Week/30 Days Discharge Instructions: Apply moisturizing lotion as directed Topical: Gentamicin 1 x Per Week/30 Days Discharge Instructions: As directed by physician Topical: Mupirocin Ointment 1 x Per Week/30 Days Discharge Instructions: Apply Mupirocin (Bactroban) as instructed Prim Dressing: Hydrofera Blue Ready Transfer Foam, 4x5 (in/in) 1 x Per Week/30 Days ary Discharge Instructions: Apply to wound bed as instructed Secondary Dressing: ABD Pad, 8x10 1 x Per Week/30 Days Discharge Instructions: Apply over primary dressing as directed. Secondary Dressing: Woven Gauze Sponge, Non-Sterile 4x4 in 1 x Per Week/30 Days Discharge Instructions: Apply over primary dressing as directed. Compression Wrap: Urgo K2 Lite,  (equivalent to a 3 layer) two layer compression system, regular 1 x Per Week/30 Days Discharge Instructions: or Apply Urgo K2 Lite as directed (alternative to 3 layer compression). Compression Wrap: ThreePress (3 layer compression wrap) 1 x Per Week/30 Days Discharge Instructions: Apply three layer compression as directed. Compression Wrap: Stockinette 1 x Per Week/30 Days Wound #2 - Lower Leg Wound Laterality: Left, Posterior Cleanser: Soap and Water 1 x Per Week/30 Days Discharge Instructions: May shower and wash wound with dial antibacterial soap and water prior to dressing change. Cleanser: Vashe 5.8 (oz) 1 x Per Week/30 Days Discharge Instructions: Cleanse the wound with Vashe prior to applying a clean dressing using gauze sponges, not tissue or cotton balls. Peri-Wound Care: Sween Lotion (Moisturizing lotion) 1 x Per Week/30 Days Discharge Instructions: Apply moisturizing lotion as directed Topical: Gentamicin 1 x Per Week/30 Days Discharge Instructions: As directed by physician Topical: Mupirocin Ointment 1 x Per Week/30 Days Discharge Instructions: Apply Mupirocin (Bactroban) as instructed Prim Dressing: Hydrofera Blue Ready Transfer Foam, 4x5 (in/in) 1 x Per Week/30 Days ary Discharge Instructions: Apply to wound bed as instructed Secondary Dressing: ABD Pad, 8x10 1 x Per Week/30 Days Discharge Instructions: Apply over primary dressing as directed. Secondary Dressing: Woven Gauze Sponge, Non-Sterile 4x4 in 1 x Per Week/30 Days Discharge Instructions: Apply over primary dressing as directed. Compression Wrap: Urgo K2 Lite, (equivalent to a 3 layer) two layer compression system, regular 1 x Per Week/30 Days Discharge Instructions: or Apply Urgo K2 Lite as directed (alternative to 3 layer compression). Compression Wrap: ThreePress (3 layer compression wrap) 1 x Per Week/30 Days Discharge Instructions: Apply three layer compression as directed. Compression Wrap: Stockinette 1 x Per  Week/30 Days Electronic Signature(s) Signed: 02/05/2023 4:26:53 PM By: Geralyn Corwin DO Previous Signature: 02/03/2023 1:54:09 PM Version By: Shawn Stall RN, BSN [name] ([id number]) [id number] Page 5 of 10 Previous Signature: 02/03/2023 1:54:09 PM Version By: Shawn Stall RN, BSN Entered By: Geralyn Corwin on 02/04/2023 15:51:58 -------------------------------------------------------------------------------- Problem List Details Patient Name: Date of Service: [name] [name] Bulla K. 02/01/2023 9:30 A M Medical Record Number: [id number] Patient Account Number: [id number] Date of Birth/Sex: Treating RN: May 25, 1971 (52 y.o. M) Primary Care Provider: Barbette Reichmann Other Clinician: Referring Provider: Treating Provider/Extender: Adolph Pollack Weeks in Treatment: 1 Active Problems ICD-10 Encounter Code Description Active Date MDM Diagnosis L97.812 Non-pressure chronic ulcer of other part of right lower leg with fat layer 01/25/2023 No Yes exposed L97.822 Non-pressure chronic ulcer of other part of left lower leg with fat layer exposed7/06/2023 No Yes E11.622 Type 2 diabetes mellitus with other skin ulcer 01/25/2023 No Yes I87.313 Chronic venous hypertension (idiopathic) with ulcer of bilateral lower extremity 01/25/2023 No  Yes Inactive Problems Resolved Problems Electronic Signature(s) Signed: 02/05/2023 4:26:53 PM By: Geralyn Corwin DO Entered By: Geralyn Corwin on 02/04/2023 15:24:52 -------------------------------------------------------------------------------- Progress Note Details Patient Name: Date of Service: [name] [name] Bulla K. 02/01/2023 9:30 A M Medical Record Number: [id number] Patient Account Number: [id number] Date of Birth/Sex: Treating RN: Dec 13, 1970 (52 y.o. M) Primary Care Provider: Barbette Reichmann Other Clinician: Referring Provider: Treating Provider/Extender: Adolph Pollack Weeks in Treatment: 1 Subjective Chief Complaint Information obtained from Patient 01/25/2023; bilateral lower extremity wounds [name], [name] ([id number]) [id number] Page 6 of 10 History of Present Illness (HPI) 01/25/2023 Mr. Eriel Doyon is a 52 year old male with a past medical history of controlled type 2 diabetes on insulin, venous insufficiency and DVT that presents to the clinic for bilateral lower extremity wounds. He states that the wound on the left was started by a bug bite and has been present for the past 4 months. The right lower extremity wound he states started out as a soft tissue infection and has been present for the past 6 years. He has been treated by a wound care center in [facility] in [place]. He was last seen in the wound care center 2 years ago Could not continue following up due to lack of transportation. He states that recently the rehab center has been wrapping his legs with compression wrap and a wound dressing underneath. It is unclear when the last time this happened was. He is not wearing any compression today. He currently denies signs of infection. 7/19; patient presents for follow-up. We have been using antibiotic ointment with Hydrofera Blue under 3 layer compression to the lower extremities bilaterally. Wounds are smaller. Patient History Information obtained from Patient. Family History Heart Disease, Hypertension - Mother,Siblings, Kidney Disease - Father,Siblings, Stroke - Mother, No family history of Diabetes. Social History Never smoker, Marital Status - Single, Alcohol Use - Never, Drug Use - No History, Caffeine Use - Moderate - soda,coffee. Medical History Hematologic/Lymphatic Patient has history of Anemia Cardiovascular Patient has history of Deep Vein Thrombosis - both leg, Hypertension, Peripheral Venous Disease Endocrine Patient has history of Type II Diabetes - HgbA1c  6.2 (5/24) Musculoskeletal Patient has history of Gout Hospitalization/Surgery History - scrotal surgery. - right elbow surgery. - right elbow septic arthritis IandD x2 with skin graft. - hernia repair 2022. - 2018 Ecoli sepsis. - necrotizing fasciitis. Medical A Surgical History Notes nd Constitutional Symptoms (General Health) CVA Cardiovascular cellulitis right leg 2018 Genitourinary CKD stage 2 Objective Constitutional respirations regular, non-labored and within target range for patient.. Vitals Time Taken: 9:34 AM, Height: 70 in, Weight: 264 lbs, BMI: 37.9, Temperature: 98.3 F, Pulse: 65 bpm, Respiratory Rate: 16 breaths/min, Blood Pressure: 145/83 mmHg. Cardiovascular 2+ dorsalis pedis/posterior tibialis pulses. Psychiatric pleasant and cooperative. General Notes: Right lower extremity: T the posterior aspect there is an open wound with granulation tissue and nonviable tissue. Good edema control . No o surrounding signs of infection including increased warmth, erythema or purulent drainage. Left lower extremity: T the posterior aspect there is an open wound o with hyper granulated tissue throughout. No signs of surrounding soft tissue infection including increased warmth, erythema or purulent drainage. Integumentary (Hair, Skin) Wound #1 status is Open. Original cause of wound was Other Lesion. The date acquired was: 01/01/2018. The wound has been in treatment 1 weeks. The wound is located on the Right,Lateral Lower Leg. The wound measures 5.9cm length x 2.1cm width x 0.3cm depth; 9.731cm^2 area and 2.919cm^3 volume.  There is Fat Layer (Subcutaneous Tissue) exposed. There is no tunneling or undermining noted. There is a medium amount of serosanguineous drainage noted. The wound margin is distinct with the outline attached to the wound base. There is large (67-100%) red, pink granulation within the wound bed. There is a small (1- 33%) amount of necrotic tissue within the wound  bed including Adherent Slough. The periwound skin appearance exhibited: Hemosiderin Staining. The periwound skin appearance did not exhibit: Callus, Crepitus, Excoriation, Induration, Rash, Scarring, Dry/Scaly, Maceration, Atrophie Blanche, Cyanosis, Ecchymosis, Mottled, Pallor, Rubor, Erythema. Periwound temperature was noted as No Abnormality. Wound #2 status is Open. Original cause of wound was Bite. The date acquired was: 09/14/2022. The wound has been in treatment 1 weeks. The wound is located on the Left,Posterior Lower Leg. The wound measures 1cm length x 1.3cm width x 0.1cm depth; 1.021cm^2 area and 0.102cm^3 volume. There is Fat Layer (Subcutaneous Tissue) exposed. There is no tunneling or undermining noted. There is a medium amount of serosanguineous drainage noted. The wound margin is distinct with the outline attached to the wound base. There is large (67-100%) red granulation within the wound bed. There is a small (1-33%) amount of necrotic tissue within the wound bed including Adherent Slough. The periwound skin appearance exhibited: Hemosiderin Staining. The periwound skin appearance did not exhibit: Callus, Crepitus, Excoriation, Induration, Rash, Scarring, Dry/Scaly, Maceration, Atrophie Blanche, Cyanosis, Ecchymosis, Mottled, Pallor, Rubor, Erythema. Periwound temperature was noted as No Abnormality. FOX, SALMINEN ([id number]) [id number] Page 7 of 10 Assessment Active Problems ICD-10 Non-pressure chronic ulcer of other part of right lower leg with fat layer exposed Non-pressure chronic ulcer of other part of left lower leg with fat layer exposed Type 2 diabetes mellitus with other skin ulcer Chronic venous hypertension (idiopathic) with ulcer of bilateral lower extremity Patient's wounds have shown improvement in size in appearance since last clinic visit. I debrided nonviable tissue to the right lower extremity and recommended continuing Hydrofera  Blue and antibiotic ointment under 3 layer. I used silver nitrate on the left lower extremity wound. Also recommended continue with Hydrofera Blue and antibiotic ointment under 3 layer compression here. Follow-up in 1 week. Procedures Wound #1 Pre-procedure diagnosis of Wound #1 is a Diabetic Wound/Ulcer of the Lower Extremity located on the Right,Lateral Lower Leg .Severity of Tissue Pre Debridement is: Fat layer exposed. There was a Excisional Skin/Subcutaneous Tissue Debridement with a total area of 9.73 sq cm performed by Geralyn Corwin, DO. With the following instrument(s): Curette to remove Viable and Non-Viable tissue/material. Material removed includes Subcutaneous Tissue, Slough, Skin: Dermis, and Skin: Epidermis after achieving pain control using Lidocaine 4% Topical Solution. A time out was conducted at 10:20, prior to the start of the procedure. A Minimum amount of bleeding was controlled with Pressure. The procedure was tolerated well with a pain level of 0 throughout and a pain level of 0 following the procedure. Post Debridement Measurements: 5.9cm length x 2.1cm width x 0.3cm depth; 2.919cm^3 volume. Character of Wound/Ulcer Post Debridement is improved. Severity of Tissue Post Debridement is: Fat layer exposed. Post procedure Diagnosis Wound #1: Same as Pre-Procedure Pre-procedure diagnosis of Wound #1 is a Diabetic Wound/Ulcer of the Lower Extremity located on the Right,Lateral Lower Leg . There was a Double Layer Compression Therapy Procedure by Karie Schwalbe, RN. Post procedure Diagnosis Wound #1: Same as Pre-Procedure Wound #2 Pre-procedure diagnosis of Wound #2 is a Diabetic Wound/Ulcer of the Lower Extremity located on the Left,Posterior Lower Leg . There was a Double  Layer Compression Therapy Procedure by Karie Schwalbe, RN. Post procedure Diagnosis Wound #2: Same as Pre-Procedure Pre-procedure diagnosis of Wound #2 is a Diabetic Wound/Ulcer of the Lower Extremity  located on the Left,Posterior Lower Leg . An Chemical Cauterization procedure was performed by Geralyn Corwin, DO. Post procedure Diagnosis Wound #2: Same as Pre-Procedure Notes: silver nitrate used. Plan Follow-up Appointments: Return Appointment in 1 week. - Dr. Mikey Bussing Room 9 Anesthetic: (In clinic) Topical Lidocaine 4% applied to wound bed Bathing/ Shower/ Hygiene: May shower with protection but do not get wound dressing(s) wet. Protect dressing(s) with water repellant cover (for example, large plastic bag) or a cast cover and may then take shower. - Please do not get the legs wet. Please use cast protectors when showering. Cast protectors can be purchased from [employer], Medical supply store. Cost ranges from $17-$37 Edema Control - Lymphedema / SCD / Other: Lymphedema Pumps. Use Lymphedema pumps on leg(s) 2-3 times a day for 45-60 minutes. If wearing any wraps or hose, do not remove them. Continue exercising as instructed. - Keep using the Lymphadema Pumps.You may place the th Lyphadema pumps over the compression wraps. Elevate legs to the level of the heart or above for 30 minutes daily and/or when sitting for 3-4 times a day throughout the day. Avoid standing for long periods of time. Exercise regularly - As tolerated Off-Loading: Other: - Keep legs elevated to heart level or above when sitting. You may place a pillow behind the calf for comfort, if so desired. WOUND #1: - Lower Leg Wound Laterality: Right, Lateral Cleanser: Soap and Water 1 x Per Week/30 Days Discharge Instructions: May shower and wash wound with dial antibacterial soap and water prior to dressing change. Cleanser: Vashe 5.8 (oz) 1 x Per Week/30 Days Discharge Instructions: Cleanse the wound with Vashe prior to applying a clean dressing using gauze sponges, not tissue or cotton balls. Peri-Wound Care: Sween Lotion (Moisturizing lotion) 1 x Per Week/30 Days Discharge Instructions: Apply  moisturizing lotion as directed Topical: Gentamicin 1 x Per Week/30 Days Discharge Instructions: As directed by physician Topical: Mupirocin Ointment 1 x Per Week/30 Days Discharge Instructions: Apply Mupirocin (Bactroban) as instructed Prim Dressing: Hydrofera Blue Ready Transfer Foam, 4x5 (in/in) 1 x Per Week/30 Days ary Discharge Instructions: Apply to wound bed as instructed Secondary Dressing: ABD Pad, 8x10 1 x Per Week/30 Days Discharge Instructions: Apply over primary dressing as directed. ALMUS, WOODHAM ([id number]) [id number] Page 8 of 10 Secondary Dressing: Woven Gauze Sponge, Non-Sterile 4x4 in 1 x Per Week/30 Days Discharge Instructions: Apply over primary dressing as directed. Com pression Wrap: Urgo K2 Lite, (equivalent to a 3 layer) two layer compression system, regular 1 x Per Week/30 Days Discharge Instructions: or Apply Urgo K2 Lite as directed (alternative to 3 layer compression). Com pression Wrap: ThreePress (3 layer compression wrap) 1 x Per Week/30 Days Discharge Instructions: Apply three layer compression as directed. Com pression Wrap: Stockinette 1 x Per Week/30 Days WOUND #2: - Lower Leg Wound Laterality: Left, Posterior Cleanser: Soap and Water 1 x Per Week/30 Days Discharge Instructions: May shower and wash wound with dial antibacterial soap and water prior to dressing change. Cleanser: Vashe 5.8 (oz) 1 x Per Week/30 Days Discharge Instructions: Cleanse the wound with Vashe prior to applying a clean dressing using gauze sponges, not tissue or cotton balls. Peri-Wound Care: Sween Lotion (Moisturizing lotion) 1 x Per Week/30 Days Discharge Instructions: Apply moisturizing lotion as directed Topical: Gentamicin 1 x Per Week/30 Days Discharge Instructions: As  directed by physician Topical: Mupirocin Ointment 1 x Per Week/30 Days Discharge Instructions: Apply Mupirocin (Bactroban) as instructed Prim Dressing: Hydrofera Blue Ready  Transfer Foam, 4x5 (in/in) 1 x Per Week/30 Days ary Discharge Instructions: Apply to wound bed as instructed Secondary Dressing: ABD Pad, 8x10 1 x Per Week/30 Days Discharge Instructions: Apply over primary dressing as directed. Secondary Dressing: Woven Gauze Sponge, Non-Sterile 4x4 in 1 x Per Week/30 Days Discharge Instructions: Apply over primary dressing as directed. Com pression Wrap: Urgo K2 Lite, (equivalent to a 3 layer) two layer compression system, regular 1 x Per Week/30 Days Discharge Instructions: or Apply Urgo K2 Lite as directed (alternative to 3 layer compression). Com pression Wrap: ThreePress (3 layer compression wrap) 1 x Per Week/30 Days Discharge Instructions: Apply three layer compression as directed. Com pression Wrap: Stockinette 1 x Per Week/30 Days 1. In office sharp debridement 2. Silver nitrate 3. Hydrofera Blue and antibiotic ointment under 3 layer compression to the lower extremities bilaterally 4. Follow-up in 1 week Electronic Signature(s) Signed: 02/05/2023 4:26:53 PM By: Geralyn Corwin DO Entered By: Geralyn Corwin on 02/05/2023 14:20:39 -------------------------------------------------------------------------------- HxROS Details Patient Name: Date of Service: [name] [name] Bulla K. 02/01/2023 9:30 A M Medical Record Number: [id number] Patient Account Number: [id number] Date of Birth/Sex: Treating RN: 09/23/1970 (52 y.o. M) Primary Care Provider: Barbette Reichmann Other Clinician: Referring Provider: Treating Provider/Extender: Adolph Pollack Weeks in Treatment: 1 Information Obtained From Patient Constitutional Symptoms (General Health) Medical History: Past Medical History Notes: CVA Hematologic/Lymphatic Medical History: Positive for: Anemia Cardiovascular Medical History: Positive for: Deep Vein Thrombosis - both leg; Hypertension; Peripheral Venous Disease Past Medical History Notes: cellulitis right leg  2018 Endocrine [name], [name] ([id number]) [id number] Page 9 of 10 Medical History: Positive for: Type II Diabetes - HgbA1c 6.2 (5/24) Time with diabetes: 9 years Treated with: Insulin Blood sugar tested every day: Yes Tested : 3 x day Blood sugar testing results: Breakfast: 97 Genitourinary Medical History: Past Medical History Notes: CKD stage 2 Musculoskeletal Medical History: Positive for: Gout Immunizations Pneumococcal Vaccine: Received Pneumococcal Vaccination: No Implantable Devices None Hospitalization / Surgery History Type of Hospitalization/Surgery scrotal surgery right elbow surgery right elbow septic arthritis IandD x2 with skin graft hernia repair 2022 2018 Ecoli sepsis necrotizing fasciitis Family and Social History Diabetes: No; Heart Disease: Yes; Hypertension: Yes - Mother,Siblings; Kidney Disease: Yes - Father,Siblings; Stroke: Yes - Mother; Never smoker; Marital Status - Single; Alcohol Use: Never; Drug Use: No History; Caffeine Use: Moderate - soda,coffee; Financial Concerns: No; Food, Clothing or Shelter Needs: No; Support System Lacking: No; Transportation Concerns: No Electronic Signature(s) Signed: 02/05/2023 4:26:53 PM By: Geralyn Corwin DO Entered By: Geralyn Corwin on 02/04/2023 15:28:50 -------------------------------------------------------------------------------- SuperBill Details Patient Name: Date of Service: [name] [name] Bulla K. 02/01/2023 Medical Record Number: [id number] Patient Account Number: [id number] Date of Birth/Sex: Treating RN: 1971/06/25 (52 y.o. [name] Primary Care Provider: Barbette Reichmann Other Clinician: Referring Provider: Treating Provider/Extender: Adolph Pollack Weeks in Treatment: 1 Diagnosis Coding ICD-10 Codes Code Description [phone] Non-pressure chronic ulcer of other part of right lower leg with fat layer exposed L97.822 Non-pressure  chronic ulcer of other part of left lower leg with fat layer exposed E11.622 Type 2 diabetes mellitus with other skin ulcer I87.313 Chronic venous hypertension (idiopathic) with ulcer of bilateral lower extremity Facility Procedures : [facility], 36 CPT4 Code: Jovoni K ([id number]) [id number] ICD-1 L97. E11. I87. Description: [phone] - DEB SUBQ TISSUE 20 SQ CM/< 0 Diagnosis Description  812 Non-pressure chronic ulcer of other part of right lower leg with fat layer expose 622 Type 2 diabetes mellitus with other skin ulcer 313 Chronic venous hypertension  (idiopathic) with ulcer of bilateral lower extremity Modifier: [id number] 1 d Quantity: 7.pdf Page 10 of 10 : 36 CPT4 Code: 100160 17250 ICD-1 L97. E11. I87. Description: - CHEM CAUT GRANULATION TISS 0 Diagnosis Description 822 Non-pressure chronic ulcer of other part of left lower leg with fat layer exposed 622 Type 2 diabetes mellitus with other skin ulcer 313 Chronic venous hypertension (idiopathic) with  ulcer of bilateral lower extremity Modifier: 59 1 Quantity: Physician Procedures : CPT4 Code Description Modifier [id number] - WC PHYS SUBQ TISS 20 SQ CM ICD-10 Diagnosis Description L97.812 Non-pressure chronic ulcer of other part of right lower leg with fat layer exposed E11.622 Type 2 diabetes mellitus with other skin ulcer  I87.313 Chronic venous hypertension (idiopathic) with ulcer of bilateral lower extremity Quantity: 1 : [id number] - WC PHYS CHEM CAUT GRAN TISSUE 59 ICD-10 Diagnosis Description L97.822 Non-pressure chronic ulcer of other part of left lower leg with fat layer exposed E11.622 Type 2 diabetes mellitus with other skin ulcer I87.313 Chronic venous  hypertension (idiopathic) with ulcer of bilateral lower extremity Quantity: 1 Electronic Signature(s) Signed: 02/05/2023 4:26:53 PM By: Geralyn Corwin DO Previous Signature: 02/03/2023 1:54:09 PM Version By: Shawn Stall RN, BSN Entered By: Geralyn Corwin on 02/05/2023 14:20:57

## 2023-02-06 ENCOUNTER — Encounter: Payer: Self-pay | Admitting: Gastroenterology

## 2023-02-06 DIAGNOSIS — R809 Proteinuria, unspecified: Secondary | ICD-10-CM | POA: Diagnosis not present

## 2023-02-06 DIAGNOSIS — Z794 Long term (current) use of insulin: Secondary | ICD-10-CM | POA: Diagnosis not present

## 2023-02-06 DIAGNOSIS — E1129 Type 2 diabetes mellitus with other diabetic kidney complication: Secondary | ICD-10-CM | POA: Diagnosis not present

## 2023-02-07 ENCOUNTER — Encounter: Payer: Self-pay | Admitting: Anesthesiology

## 2023-02-07 ENCOUNTER — Encounter: Payer: Self-pay | Admitting: Gastroenterology

## 2023-02-07 ENCOUNTER — Ambulatory Visit
Admission: RE | Admit: 2023-02-07 | Discharge: 2023-02-07 | Disposition: A | Discharge status: Home / Self Care | Payer: Medicaid Other | Attending: Gastroenterology | Admitting: Gastroenterology

## 2023-02-07 ENCOUNTER — Encounter
Admission: RE | Disposition: A | Discharge status: Home / Self Care | Payer: Self-pay | Source: Home / Self Care | Attending: Gastroenterology

## 2023-02-07 DIAGNOSIS — Z538 Procedure and treatment not carried out for other reasons: Secondary | ICD-10-CM | POA: Diagnosis not present

## 2023-02-07 DIAGNOSIS — Z1211 Encounter for screening for malignant neoplasm of colon: Secondary | ICD-10-CM | POA: Diagnosis not present

## 2023-02-07 DIAGNOSIS — Z86010 Personal history of colonic polyps: Secondary | ICD-10-CM | POA: Insufficient documentation

## 2023-02-07 HISTORY — PX: COLONOSCOPY WITH PROPOFOL: SHX5780

## 2023-02-07 SURGERY — COLONOSCOPY WITH PROPOFOL
Anesthesia: General

## 2023-02-07 MED ORDER — SODIUM CHLORIDE 0.9 % IV SOLN: INTRAVENOUS | Status: DC

## 2023-02-07 NOTE — Progress Notes (Signed)
Patient came in for scheduled Colonoscopy but had poor colon prep.  Dr Timothy Lasso spoke with the patient and it was decided to reschedule the patient. The office will call and reschedule and provide an additional colon prep.  The patient agrees with the plan.

## 2023-02-07 NOTE — H&P (Signed)
   Pre-Procedure H&P   Patient ID: [name] is a 52 y.o. male.  To be rescheduled- pt still with liquid brown bowel movements prior to colonoscopy.  Pt verbalized understanding and questions were answered. Our office will reach out to him.  Enis Slipper, DO [facility]

## 2023-02-08 ENCOUNTER — Encounter: Payer: Self-pay | Admitting: Gastroenterology

## 2023-02-12 ENCOUNTER — Encounter (HOSPITAL_BASED_OUTPATIENT_CLINIC_OR_DEPARTMENT_OTHER): Payer: Medicaid Other | Admitting: Internal Medicine

## 2023-02-12 DIAGNOSIS — Z8249 Family history of ischemic heart disease and other diseases of the circulatory system: Secondary | ICD-10-CM | POA: Diagnosis not present

## 2023-02-12 DIAGNOSIS — I87313 Chronic venous hypertension (idiopathic) with ulcer of bilateral lower extremity: Secondary | ICD-10-CM | POA: Diagnosis not present

## 2023-02-12 DIAGNOSIS — E11622 Type 2 diabetes mellitus with other skin ulcer: Secondary | ICD-10-CM | POA: Diagnosis not present

## 2023-02-12 DIAGNOSIS — L97812 Non-pressure chronic ulcer of other part of right lower leg with fat layer exposed: Secondary | ICD-10-CM | POA: Diagnosis not present

## 2023-02-12 DIAGNOSIS — Z86718 Personal history of other venous thrombosis and embolism: Secondary | ICD-10-CM | POA: Diagnosis not present

## 2023-02-12 DIAGNOSIS — Z794 Long term (current) use of insulin: Secondary | ICD-10-CM | POA: Diagnosis not present

## 2023-02-12 DIAGNOSIS — L97822 Non-pressure chronic ulcer of other part of left lower leg with fat layer exposed: Secondary | ICD-10-CM | POA: Diagnosis not present

## 2023-02-12 DIAGNOSIS — I129 Hypertensive chronic kidney disease with stage 1 through stage 4 chronic kidney disease, or unspecified chronic kidney disease: Secondary | ICD-10-CM | POA: Diagnosis not present

## 2023-02-12 DIAGNOSIS — E1122 Type 2 diabetes mellitus with diabetic chronic kidney disease: Secondary | ICD-10-CM | POA: Diagnosis not present

## 2023-02-12 DIAGNOSIS — N182 Chronic kidney disease, stage 2 (mild): Secondary | ICD-10-CM | POA: Diagnosis not present

## 2023-02-13 NOTE — Progress Notes (Signed)
ZAYVIAN, GEWIRTZ ([id number]) [id number] Page 1 of 10 Visit Report for 02/12/2023 Chief Complaint Document Details Patient Name: Date of Service: [name] 02/12/2023 9:30 A M Medical Record Number: [id number] Patient Account Number: [id number] Date of Birth/Sex: Treating RN: 1971/06/20 (52 y.o. M) Primary Care Provider: Barbette Reichmann Other Clinician: Referring Provider: Treating Provider/Extender: Adolph Pollack Weeks in Treatment: 2 Information Obtained from: Patient Chief Complaint 01/25/2023; bilateral lower extremity wounds Electronic Signature(s) Signed: 02/12/2023 12:36:28 PM By: Geralyn Corwin DO Entered By: Geralyn Corwin on 02/12/2023 10:50:30 -------------------------------------------------------------------------------- Debridement Details Patient Name: Date of Service: [name] 02/12/2023 9:30 A M Medical Record Number: [id number] Patient Account Number: [id number] Date of Birth/Sex: Treating RN: 02-10-1971 (52 y.o. [name] Primary Care Provider: Barbette Reichmann Other Clinician: Referring Provider: Treating Provider/Extender: Adolph Pollack Weeks in Treatment: 2 Debridement Performed for Assessment: Wound #1 Right,Lateral Lower Leg Performed By: Physician Geralyn Corwin, DO Debridement Type: Debridement Severity of Tissue Pre Debridement: Fat layer exposed Level of Consciousness (Pre-procedure): Awake and Alert Pre-procedure Verification/Time Out Yes - 10:15 Taken: Start Time: 10:20 Pain Control: Lidocaine 5% topical ointment Percent of Wound Bed Debrided: 100% T Area Debrided (cm): otal 7.63 Tissue and other material debrided: Non-Viable, Slough, Subcutaneous, Slough Level: Skin/Subcutaneous Tissue Debridement Description: Excisional Instrument: Curette Bleeding: Minimum Hemostasis Achieved: Pressure Response to Treatment: Procedure was  tolerated well Level of Consciousness (Post- Awake and Alert procedure): Post Debridement Measurements of Total Wound Length: (cm) 5.4 Width: (cm) 1.8 Depth: (cm) 0.1 Volume: (cm) 0.763 Character of Wound/Ulcer Post Debridement: Improved Severity of Tissue Post Debridement: Fat layer exposed Post Procedure Diagnosis MACLAIN, DUECKER K ([id number]) [id number] Page 2 of 10 Same as Pre-procedure Notes Scribed for DR Mikey Bussing by Redmond Pulling, RN Electronic Signature(s) Signed: 02/12/2023 12:36:28 PM By: Geralyn Corwin DO Signed: 02/12/2023 5:06:56 PM By: Redmond Pulling RN, BSN Entered By: Redmond Pulling on 02/12/2023 10:22:05 -------------------------------------------------------------------------------- HPI Details Patient Name: Date of Service: [name] 02/12/2023 9:30 A M Medical Record Number: [id number] Patient Account Number: [id number] Date of Birth/Sex: Treating RN: 09/28/1970 (52 y.o. M) Primary Care Provider: Barbette Reichmann Other Clinician: Referring Provider: Treating Provider/Extender: Adolph Pollack Weeks in Treatment: 2 History of Present Illness HPI Description: 01/25/2023 [name] is a 52 year old male with a past medical history of controlled type 2 diabetes on insulin, venous insufficiency and DVT that presents to the clinic for bilateral lower extremity wounds. He states that the wound on the left was started by a bug bite and has been present for the past 4 months. The right lower extremity wound he states started out as a soft tissue infection and has been present for the past 6 years. He has been treated by a wound care center in [place] [facility] in [place]. He was last seen in the wound care center 2 years ago Could not continue following up due to lack of transportation. He states that recently the rehab center has been wrapping his legs with compression wrap and a wound  dressing underneath. It is unclear when the last time this happened was. He is not wearing any compression today. He currently denies signs of infection. 7/19; patient presents for follow-up. We have been using antibiotic ointment with Hydrofera Blue under 3 layer compression to the lower extremities bilaterally. Wounds are smaller. 7/30; patient presents for follow-up. We have been using antibiotic ointment and Hydrofera Blue under 3 layer compression to the lower extremities  bilaterally. Wounds are smaller. Electronic Signature(s) Signed: 02/12/2023 12:36:28 PM By: Geralyn Corwin DO Entered By: Geralyn Corwin on 02/12/2023 10:51:28 -------------------------------------------------------------------------------- Chemical Cauterization Details Patient Name: Date of Service: [name] 02/12/2023 9:30 A M Medical Record Number: [id number] Patient Account Number: [id number] Date of Birth/Sex: Treating RN: Jul 04, 1971 (52 y.o. [name] Primary Care Provider: Barbette Reichmann Other Clinician: Referring Provider: Treating Provider/Extender: Adolph Pollack Weeks in Treatment: 2 Procedure Performed for: Wound #2 Left,Posterior Lower Leg Performed By: Physician Geralyn Corwin, DO Post Procedure Diagnosis Same as Pre-procedure Notes Silver nitrate to hypertrophic granulation tissue Scribed for DR Mikey Bussing by Redmond Pulling, RN Leisa Lenz K ([id number]) [id number] Page 3 of 10 Electronic Signature(s) Signed: 02/12/2023 12:36:28 PM By: Geralyn Corwin DO Signed: 02/12/2023 5:06:56 PM By: Redmond Pulling RN, BSN Entered By: Redmond Pulling on 02/12/2023 10:21:05 -------------------------------------------------------------------------------- Physical Exam Details Patient Name: Date of Service: [name] 02/12/2023 9:30 A M Medical Record Number: [id number] Patient Account Number: [id number] Date of  Birth/Sex: Treating RN: 08-18-1970 (52 y.o. M) Primary Care Provider: Barbette Reichmann Other Clinician: Referring Provider: Treating Provider/Extender: Marcha Dutton, Vishwanath Weeks in Treatment: 2 Constitutional respirations regular, non-labored and within target range for patient.. Cardiovascular 2+ dorsalis pedis/posterior tibialis pulses. Psychiatric pleasant and cooperative. Notes Right lower extremity: T the posterior aspect there is an open wound with granulation tissue and nonviable tissue. Good edema control . No surrounding signs o of infection including increased warmth, erythema or purulent drainage. Left lower extremity: T the posterior aspect there is an open wound with granulation o tissue and areas of hyper granulated tissue. No signs of surrounding soft tissue infection including increased warmth, erythema or purulent drainage. Electronic Signature(s) Signed: 02/12/2023 12:36:28 PM By: Geralyn Corwin DO Entered By: Geralyn Corwin on 02/12/2023 10:52:25 -------------------------------------------------------------------------------- Physician Orders Details Patient Name: Date of Service: [name] 02/12/2023 9:30 A M Medical Record Number: [id number] Patient Account Number: [id number] Date of Birth/Sex: Treating RN: 12/23/1970 (52 y.o. [name] Primary Care Provider: Barbette Reichmann Other Clinician: Referring Provider: Treating Provider/Extender: Adolph Pollack Weeks in Treatment: 2 Verbal / Phone Orders: No Diagnosis Coding Follow-up Appointments ppointment in 1 week. - Dr. Mikey Bussing Room 9 Tuesday 02/19/23 @ 0930 Return A Anesthetic (In clinic) Topical Lidocaine 4% applied to wound bed Bathing/ Shower/ Hygiene May shower with protection but do not get wound dressing(s) wet. Protect dressing(s) with water repellant cover (for example, large plastic bag) or a cast cover and may then take shower. - Please do  not get the legs wet. Please use cast protectors when showering. Cast protectors can be purchased from [employer], Medical supply store. Cost ranges from $17-$37 Edema Control - Lymphedema / SCD / Other Bilateral Lower Extremities Lymphedema Pumps. Use Lymphedema pumps on leg(s) 2-3 times a day for 45-60 minutes. If wearing any wraps or hose, do not remove them. Continue exercising as instructed. - Keep using the Lymphadema Pumps.You may place the th Lyphadema pumps over the compression CHAUNCEY, AHLGREN K ([id number]) [id number] Page 4 of 10 wraps. Elevate legs to the level of the heart or above for 30 minutes daily and/or when sitting for 3-4 times a day throughout the day. Avoid standing for long periods of time. Exercise regularly - As tolerated Off-Loading Other: - Keep legs elevated to heart level or above when sitting. You may place a pillow behind the calf for comfort, if so desired. Wound Treatment Wound #1 - Lower Leg Wound Laterality:  Right, Lateral Cleanser: Soap and Water 1 x Per Week/30 Days Discharge Instructions: May shower and wash wound with dial antibacterial soap and water prior to dressing change. Cleanser: Vashe 5.8 (oz) 1 x Per Week/30 Days Discharge Instructions: Cleanse the wound with Vashe prior to applying a clean dressing using gauze sponges, not tissue or cotton balls. Peri-Wound Care: Sween Lotion (Moisturizing lotion) 1 x Per Week/30 Days Discharge Instructions: Apply moisturizing lotion as directed Topical: Gentamicin 1 x Per Week/30 Days Discharge Instructions: As directed by physician Topical: Mupirocin Ointment 1 x Per Week/30 Days Discharge Instructions: Apply Mupirocin (Bactroban) as instructed Prim Dressing: Hydrofera Blue Ready Transfer Foam, 4x5 (in/in) 1 x Per Week/30 Days ary Discharge Instructions: Apply to wound bed as instructed Secondary Dressing: ABD Pad, 8x10 1 x Per Week/30 Days Discharge  Instructions: Apply over primary dressing as directed. Secondary Dressing: Woven Gauze Sponge, Non-Sterile 4x4 in 1 x Per Week/30 Days Discharge Instructions: Apply over primary dressing as directed. Compression Wrap: Urgo K2 Lite, (equivalent to a 3 layer) two layer compression system, regular 1 x Per Week/30 Days Discharge Instructions: or Apply Urgo K2 Lite as directed (alternative to 3 layer compression). Compression Wrap: ThreePress (3 layer compression wrap) 1 x Per Week/30 Days Discharge Instructions: Apply three layer compression as directed. Compression Wrap: Stockinette 1 x Per Week/30 Days Wound #2 - Lower Leg Wound Laterality: Left, Posterior Cleanser: Soap and Water 1 x Per Week/30 Days Discharge Instructions: May shower and wash wound with dial antibacterial soap and water prior to dressing change. Cleanser: Vashe 5.8 (oz) 1 x Per Week/30 Days Discharge Instructions: Cleanse the wound with Vashe prior to applying a clean dressing using gauze sponges, not tissue or cotton balls. Peri-Wound Care: Sween Lotion (Moisturizing lotion) 1 x Per Week/30 Days Discharge Instructions: Apply moisturizing lotion as directed Topical: Gentamicin 1 x Per Week/30 Days Discharge Instructions: As directed by physician Topical: Mupirocin Ointment 1 x Per Week/30 Days Discharge Instructions: Apply Mupirocin (Bactroban) as instructed Prim Dressing: Hydrofera Blue Ready Transfer Foam, 4x5 (in/in) 1 x Per Week/30 Days ary Discharge Instructions: Apply to wound bed as instructed Secondary Dressing: ABD Pad, 8x10 1 x Per Week/30 Days Discharge Instructions: Apply over primary dressing as directed. Secondary Dressing: Woven Gauze Sponge, Non-Sterile 4x4 in 1 x Per Week/30 Days Discharge Instructions: Apply over primary dressing as directed. Compression Wrap: Urgo K2 Lite, (equivalent to a 3 layer) two layer compression system, regular 1 x Per Week/30 Days Discharge Instructions: or Apply Urgo K2 Lite as  directed (alternative to 3 layer compression). Compression Wrap: ThreePress (3 layer compression wrap) 1 x Per Week/30 Days Discharge Instructions: Apply three layer compression as directed. Compression Wrap: Stockinette 1 x Per Week/30 Days Patient Medications MAHER, LAUDADIO ([id number]) [phone].pdf Page 5 of 10 llergies: No Known Drug Allergies A Notifications Medication Indication Start End 02/12/2023 lidocaine DOSE topical 5 % ointment - ointment topical once daily Electronic Signature(s) Signed: 02/12/2023 12:36:28 PM By: Geralyn Corwin DO Entered By: Geralyn Corwin on 02/12/2023 10:53:28 -------------------------------------------------------------------------------- Problem List Details Patient Name: Date of Service: [name] 02/12/2023 9:30 A M Medical Record Number: [id number] Patient Account Number: [id number] Date of Birth/Sex: Treating RN: 1970-12-03 (52 y.o. M) Primary Care Provider: Barbette Reichmann Other Clinician: Referring Provider: Treating Provider/Extender: Adolph Pollack Weeks in Treatment: 2 Active Problems ICD-10 Encounter Code Description Active Date MDM Diagnosis L97.812 Non-pressure chronic ulcer of other part of right lower leg with fat layer 01/25/2023 No Yes exposed L97.822 Non-pressure  chronic ulcer of other part of left lower leg with fat layer exposed7/06/2023 No Yes E11.622 Type 2 diabetes mellitus with other skin ulcer 01/25/2023 No Yes I87.313 Chronic venous hypertension (idiopathic) with ulcer of bilateral lower extremity 01/25/2023 No Yes Inactive Problems Resolved Problems Electronic Signature(s) Signed: 02/12/2023 12:36:28 PM By: Geralyn Corwin DO Entered By: Geralyn Corwin on 02/12/2023 10:49:03 -------------------------------------------------------------------------------- Progress Note Details Patient Name: Date of Service: [name] 02/12/2023 9:30 A  M Medical Record Number: [id number] Patient Account Number: [id number] Date of Birth/Sex: Treating RN: [birth year] (52 y.o. M) Primary Care Provider: Barbette Reichmann Other Clinician: JABEZ, SMTIH ([id number]) [id number] Page 6 of 10 Referring Provider: Treating Provider/Extender: Adolph Pollack Weeks in Treatment: 2 Subjective Chief Complaint Information obtained from Patient 01/25/2023; bilateral lower extremity wounds History of Present Illness (HPI) 01/25/2023 Mr. Adnaan Mowad is a 52 year old male with a past medical history of controlled type 2 diabetes on insulin, venous insufficiency and DVT that presents to the clinic for bilateral lower extremity wounds. He states that the wound on the left was started by a bug bite and has been present for the past 4 months. The right lower extremity wound he states started out as a soft tissue infection and has been present for the past 6 years. He has been treated by a wound care center in [place] [facility] in [place]. He was last seen in the wound care center 2 years ago Could not continue following up due to lack of transportation. He states that recently the rehab center has been wrapping his legs with compression wrap and a wound dressing underneath. It is unclear when the last time this happened was. He is not wearing any compression today. He currently denies signs of infection. 7/19; patient presents for follow-up. We have been using antibiotic ointment with Hydrofera Blue under 3 layer compression to the lower extremities bilaterally. Wounds are smaller. 7/30; patient presents for follow-up. We have been using antibiotic ointment and Hydrofera Blue under 3 layer compression to the lower extremities bilaterally. Wounds are smaller. Patient History Information obtained from Patient. Family History Heart Disease, Hypertension - Mother,Siblings, Kidney Disease -  Father,Siblings, Stroke - Mother, No family history of Diabetes. Social History Never smoker, Marital Status - Single, Alcohol Use - Never, Drug Use - No History, Caffeine Use - Moderate - soda,coffee. Medical History Hematologic/Lymphatic Patient has history of Anemia Cardiovascular Patient has history of Deep Vein Thrombosis - both leg, Hypertension, Peripheral Venous Disease Endocrine Patient has history of Type II Diabetes - HgbA1c 6.2 (5/24) Musculoskeletal Patient has history of Gout Hospitalization/Surgery History - scrotal surgery. - right elbow surgery. - right elbow septic arthritis IandD x2 with skin graft. - hernia repair 2022. - 2018 Ecoli sepsis. - necrotizing fasciitis. Medical A Surgical History Notes nd Constitutional Symptoms (General Health) CVA Cardiovascular cellulitis right leg 2018 Genitourinary CKD stage 2 Objective Constitutional respirations regular, non-labored and within target range for patient.. Vitals Time Taken: 9:48 AM, Height: 70 in, Weight: 264 lbs, BMI: 37.9, Temperature: 98.2 F, Pulse: 64 bpm, Respiratory Rate: 18 breaths/min, Blood Pressure: 152/89 mmHg. Cardiovascular 2+ dorsalis pedis/posterior tibialis pulses. Psychiatric pleasant and cooperative. General Notes: Right lower extremity: T the posterior aspect there is an open wound with granulation tissue and nonviable tissue. Good edema control . No o surrounding signs of infection including increased warmth, erythema or purulent drainage. Left lower extremity: T the posterior aspect there is an open wound o with granulation tissue and areas of  hyper granulated tissue. No signs of surrounding soft tissue infection including increased warmth, erythema or purulent drainage. Integumentary (Hair, Skin) QUINTEL, DELFIERRO ([id number]) [id number] Page 7 of 10 Wound #1 status is Open. Original cause of wound was Other Lesion. The date acquired was: 01/01/2018. The  wound has been in treatment 2 weeks. The wound is located on the Right,Lateral Lower Leg. The wound measures 5.4cm length x 1.8cm width x 0.3cm depth; 7.634cm^2 area and 2.29cm^3 volume. There is Fat Layer (Subcutaneous Tissue) exposed. There is no tunneling or undermining noted. There is a medium amount of serosanguineous drainage noted. The wound margin is distinct with the outline attached to the wound base. There is large (67-100%) red, pink granulation within the wound bed. There is a small (1-33%) amount of necrotic tissue within the wound bed including Adherent Slough. The periwound skin appearance exhibited: Hemosiderin Staining. The periwound skin appearance did not exhibit: Callus, Crepitus, Excoriation, Induration, Rash, Scarring, Dry/Scaly, Maceration, Atrophie Blanche, Cyanosis, Ecchymosis, Mottled, Pallor, Rubor, Erythema. Periwound temperature was noted as No Abnormality. Wound #2 status is Open. Original cause of wound was Bite. The date acquired was: 09/14/2022. The wound has been in treatment 2 weeks. The wound is located on the Left,Posterior Lower Leg. The wound measures 1cm length x 0.6cm width x 0.1cm depth; 0.471cm^2 area and 0.047cm^3 volume. There is Fat Layer (Subcutaneous Tissue) exposed. There is no tunneling or undermining noted. There is a medium amount of serosanguineous drainage noted. The wound margin is distinct with the outline attached to the wound base. There is large (67-100%) red granulation within the wound bed. There is no necrotic tissue within the wound bed. The periwound skin appearance exhibited: Hemosiderin Staining. The periwound skin appearance did not exhibit: Callus, Crepitus, Excoriation, Induration, Rash, Scarring, Dry/Scaly, Maceration, Atrophie Blanche, Cyanosis, Ecchymosis, Mottled, Pallor, Rubor, Erythema. Periwound temperature was noted as No Abnormality. Assessment Active Problems ICD-10 Non-pressure chronic ulcer of other part of right lower  leg with fat layer exposed Non-pressure chronic ulcer of other part of left lower leg with fat layer exposed Type 2 diabetes mellitus with other skin ulcer Chronic venous hypertension (idiopathic) with ulcer of bilateral lower extremity Patient's wounds have shown improvement in size in appearance since last clinic visit. I debrided nonviable tissue on the right leg and used silver nitrate on the left leg wound. Continue Hydrofera Blue and antibiotic ointment under 3 layer compression to the lower extremities bilaterally. Follow-up in 1 week. Procedures Wound #1 Pre-procedure diagnosis of Wound #1 is a Diabetic Wound/Ulcer of the Lower Extremity located on the Right,Lateral Lower Leg .Severity of Tissue Pre Debridement is: Fat layer exposed. There was a Excisional Skin/Subcutaneous Tissue Debridement with a total area of 7.63 sq cm performed by Geralyn Corwin, DO. With the following instrument(s): Curette to remove Non-Viable tissue/material. Material removed includes Subcutaneous Tissue and Slough and after achieving pain control using Lidocaine 5% topical ointment. No specimens were taken. A time out was conducted at 10:15, prior to the start of the procedure. A Minimum amount of bleeding was controlled with Pressure. The procedure was tolerated well. Post Debridement Measurements: 5.4cm length x 1.8cm width x 0.1cm depth; 0.763cm^3 volume. Character of Wound/Ulcer Post Debridement is improved. Severity of Tissue Post Debridement is: Fat layer exposed. Post procedure Diagnosis Wound #1: Same as Pre-Procedure General Notes: Scribed for DR Mikey Bussing by Redmond Pulling, RN. Pre-procedure diagnosis of Wound #1 is a Diabetic Wound/Ulcer of the Lower Extremity located on the Right,Lateral Lower Leg . There was a  Three Layer Compression Therapy Procedure by Redmond Pulling, RN. Post procedure Diagnosis Wound #1: Same as Pre-Procedure Wound #2 Pre-procedure diagnosis of Wound #2 is a Diabetic Wound/Ulcer  of the Lower Extremity located on the Left,Posterior Lower Leg . There was a Three Layer Compression Therapy Procedure by Redmond Pulling, RN. Post procedure Diagnosis Wound #2: Same as Pre-Procedure Pre-procedure diagnosis of Wound #2 is a Diabetic Wound/Ulcer of the Lower Extremity located on the Left,Posterior Lower Leg . An Chemical Cauterization procedure was performed by Geralyn Corwin, DO. Post procedure Diagnosis Wound #2: Same as Pre-Procedure Notes: Silver nitrate to hypertrophic granulation tissue Scribed for DR Mikey Bussing by Redmond Pulling, RN Plan Follow-up Appointments: Return Appointment in 1 week. - Dr. Mikey Bussing Room 9 Tuesday 02/19/23 @ 0930 Anesthetic: (In clinic) Topical Lidocaine 4% applied to wound bed Bathing/ Shower/ Hygiene: May shower with protection but do not get wound dressing(s) wet. Protect dressing(s) with water repellant cover (for example, large plastic bag) or a cast cover and may then take shower. - Please do not get the legs wet. Please use cast protectors when showering. Cast protectors can be purchased from [employer], Medical supply store. Cost ranges from $17-$37 Edema Control - Lymphedema / SCD / Other: Lymphedema Pumps. Use Lymphedema pumps on leg(s) 2-3 times a day for 45-60 minutes. If wearing any wraps or hose, do not remove them. Continue exercising as instructed. - Keep using the Lymphadema Pumps.You may place the th Lyphadema pumps over the compression wraps. Elevate legs to the level of the heart or above for 30 minutes daily and/or when sitting for 3-4 times a day throughout the day. Avoid standing for long periods of time. Exercise regularly - As tolerated Off-Loading: CALIBER, OMANA ([id number]) [phone].pdf Page 8 of 10 Other: - Keep legs elevated to heart level or above when sitting. You may place a pillow behind the calf for comfort, if so desired. The following medication(s) was prescribed:  lidocaine topical 5 % ointment ointment topical once daily was prescribed at facility WOUND #1: - Lower Leg Wound Laterality: Right, Lateral Cleanser: Soap and Water 1 x Per Week/30 Days Discharge Instructions: May shower and wash wound with dial antibacterial soap and water prior to dressing change. Cleanser: Vashe 5.8 (oz) 1 x Per Week/30 Days Discharge Instructions: Cleanse the wound with Vashe prior to applying a clean dressing using gauze sponges, not tissue or cotton balls. Peri-Wound Care: Sween Lotion (Moisturizing lotion) 1 x Per Week/30 Days Discharge Instructions: Apply moisturizing lotion as directed Topical: Gentamicin 1 x Per Week/30 Days Discharge Instructions: As directed by physician Topical: Mupirocin Ointment 1 x Per Week/30 Days Discharge Instructions: Apply Mupirocin (Bactroban) as instructed Prim Dressing: Hydrofera Blue Ready Transfer Foam, 4x5 (in/in) 1 x Per Week/30 Days ary Discharge Instructions: Apply to wound bed as instructed Secondary Dressing: ABD Pad, 8x10 1 x Per Week/30 Days Discharge Instructions: Apply over primary dressing as directed. Secondary Dressing: Woven Gauze Sponge, Non-Sterile 4x4 in 1 x Per Week/30 Days Discharge Instructions: Apply over primary dressing as directed. Com pression Wrap: Urgo K2 Lite, (equivalent to a 3 layer) two layer compression system, regular 1 x Per Week/30 Days Discharge Instructions: or Apply Urgo K2 Lite as directed (alternative to 3 layer compression). Com pression Wrap: ThreePress (3 layer compression wrap) 1 x Per Week/30 Days Discharge Instructions: Apply three layer compression as directed. Com pression Wrap: Stockinette 1 x Per Week/30 Days WOUND #2: - Lower Leg Wound Laterality: Left, Posterior Cleanser: Soap and Water 1 x Per Week/30  Days Discharge Instructions: May shower and wash wound with dial antibacterial soap and water prior to dressing change. Cleanser: Vashe 5.8 (oz) 1 x Per Week/30 Days Discharge  Instructions: Cleanse the wound with Vashe prior to applying a clean dressing using gauze sponges, not tissue or cotton balls. Peri-Wound Care: Sween Lotion (Moisturizing lotion) 1 x Per Week/30 Days Discharge Instructions: Apply moisturizing lotion as directed Topical: Gentamicin 1 x Per Week/30 Days Discharge Instructions: As directed by physician Topical: Mupirocin Ointment 1 x Per Week/30 Days Discharge Instructions: Apply Mupirocin (Bactroban) as instructed Prim Dressing: Hydrofera Blue Ready Transfer Foam, 4x5 (in/in) 1 x Per Week/30 Days ary Discharge Instructions: Apply to wound bed as instructed Secondary Dressing: ABD Pad, 8x10 1 x Per Week/30 Days Discharge Instructions: Apply over primary dressing as directed. Secondary Dressing: Woven Gauze Sponge, Non-Sterile 4x4 in 1 x Per Week/30 Days Discharge Instructions: Apply over primary dressing as directed. Com pression Wrap: Urgo K2 Lite, (equivalent to a 3 layer) two layer compression system, regular 1 x Per Week/30 Days Discharge Instructions: or Apply Urgo K2 Lite as directed (alternative to 3 layer compression). Com pression Wrap: ThreePress (3 layer compression wrap) 1 x Per Week/30 Days Discharge Instructions: Apply three layer compression as directed. Com pression Wrap: Stockinette 1 x Per Week/30 Days 1. In office sharp debridement 2. Hydrofera Blue and antibiotic ointment under 3 layer compression to the lower extremities bilaterally 3. Silver nitrate 4. Follow-up in 1 week Electronic Signature(s) Signed: 02/12/2023 12:36:28 PM By: Geralyn Corwin DO Entered By: Geralyn Corwin on 02/12/2023 10:54:19 -------------------------------------------------------------------------------- HxROS Details Patient Name: Date of Service: [name] 02/12/2023 9:30 A M Medical Record Number: [id number] Patient Account Number: [id number] Date of Birth/Sex: Treating RN: 06-09-1971 (52 y.o. M) Primary Care Provider: Barbette Reichmann Other Clinician: Referring Provider: Treating Provider/Extender: Adolph Pollack Weeks in Treatment: 2 Information Obtained From Patient Constitutional Symptoms (General Health) Medical History: Past Medical History Notes: CVA KEYVON, NOVACEK ([id number]) [id number] Page 9 of 10 Hematologic/Lymphatic Medical History: Positive for: Anemia Cardiovascular Medical History: Positive for: Deep Vein Thrombosis - both leg; Hypertension; Peripheral Venous Disease Past Medical History Notes: cellulitis right leg 2018 Endocrine Medical History: Positive for: Type II Diabetes - HgbA1c 6.2 (5/24) Time with diabetes: 9 years Treated with: Insulin Blood sugar tested every day: Yes Tested : 3 x day Blood sugar testing results: Breakfast: 97 Genitourinary Medical History: Past Medical History Notes: CKD stage 2 Musculoskeletal Medical History: Positive for: Gout Immunizations Pneumococcal Vaccine: Received Pneumococcal Vaccination: No Implantable Devices None Hospitalization / Surgery History Type of Hospitalization/Surgery scrotal surgery right elbow surgery right elbow septic arthritis IandD x2 with skin graft hernia repair 2022 2018 Ecoli sepsis necrotizing fasciitis Family and Social History Diabetes: No; Heart Disease: Yes; Hypertension: Yes - Mother,Siblings; Kidney Disease: Yes - Father,Siblings; Stroke: Yes - Mother; Never smoker; Marital Status - Single; Alcohol Use: Never; Drug Use: No History; Caffeine Use: Moderate - soda,coffee; Financial Concerns: No; Food, Clothing or Shelter Needs: No; Support System Lacking: No; Transportation Concerns: No Electronic Signature(s) Signed: 02/12/2023 12:36:28 PM By: Geralyn Corwin DO Entered By: Geralyn Corwin on 02/12/2023 10:51:42 -------------------------------------------------------------------------------- SuperBill Details Patient Name: Date of Service: HA  [name] 02/12/2023 Medical Record Number: [id number] Patient Account Number: [id number] Date of Birth/Sex: Treating RN: 1971-02-21 (52 y.o. M) Primary Care Provider: Barbette Reichmann Other Clinician: Referring Provider: Treating Provider/Extender: Marcha Dutton, Vishwanath Weeks in Treatment: 2 [name] ([id number]) [id number] Page 10 of 10 Diagnosis  Coding ICD-10 Codes Code Description [id number] Non-pressure chronic ulcer of other part of right lower leg with fat layer exposed L97.822 Non-pressure chronic ulcer of other part of left lower leg with fat layer exposed E11.622 Type 2 diabetes mellitus with other skin ulcer I87.313 Chronic venous hypertension (idiopathic) with ulcer of bilateral lower extremity Facility Procedures : CPT4 Code: [id number] Description: 11042 - DEB SUBQ TISSUE 20 SQ CM/< ICD-10 Diagnosis Description L97.812 Non-pressure chronic ulcer of other part of right lower leg with fat layer expo E11.622 Type 2 diabetes mellitus with other skin ulcer I87.313 Chronic venous  hypertension (idiopathic) with ulcer of bilateral lower extremit Modifier: sed y Quantity: 1 : CPT4 Code: [id number] Description: 17250 - CHEM CAUT GRANULATION TISS ICD-10 Diagnosis Description L97.822 Non-pressure chronic ulcer of other part of left lower leg with fat layer expos E11.622 Type 2 diabetes mellitus with other skin ulcer I87.313 Chronic venous  hypertension (idiopathic) with ulcer of bilateral lower extremit Modifier: ed y Quantity: 1 Physician Procedures : CPT4 Code Description Modifier [id number] 11042 - WC PHYS SUBQ TISS 20 SQ CM ICD-10 Diagnosis Description L97.812 Non-pressure chronic ulcer of other part of right lower leg with fat layer exposed E11.622 Type 2 diabetes mellitus with other skin ulcer  I87.313 Chronic venous hypertension (idiopathic) with ulcer of bilateral lower extremity Quantity: 1 : [id number] 17250 - WC PHYS CHEM  CAUT GRAN TISSUE ICD-10 Diagnosis Description L97.822 Non-pressure chronic ulcer of other part of left lower leg with fat layer exposed E11.622 Type 2 diabetes mellitus with other skin ulcer I87.313 Chronic venous  hypertension (idiopathic) with ulcer of bilateral lower extremity Quantity: 1 Electronic Signature(s) Signed: 02/12/2023 12:36:28 PM By: Geralyn Corwin DO Entered By: Geralyn Corwin on 02/12/2023 10:54:56

## 2023-02-13 NOTE — Progress Notes (Signed)
[name], [name] ([id number]) [id number] Page 1 of 10 Visit Report for 02/12/2023 Arrival Information Details Patient Name: Date of Service: [name]. 02/12/2023 9:30 A M Medical Record Number: [id number] Patient Account Number: [id number] Date of Birth/Sex: Treating RN: [birth year] (52 y.o. M) Primary Care Jona Erkkila: Barbette Reichmann Other Clinician: Referring Sosha Shepherd: Treating Kennen Stammer/Extender: Adolph Pollack Weeks in Treatment: 2 Visit Information History Since Last Visit Added or deleted any medications: No Patient Arrived: Ambulatory Any new allergies or adverse reactions: No Arrival Time: 09:45 Had a fall or experienced change in No Accompanied By: self activities of daily living that may affect Transfer Assistance: None risk of falls: Patient Identification Verified: Yes Signs or symptoms of abuse/neglect since last visito No Secondary Verification Process Completed: Yes Hospitalized since last visit: No Patient Requires Transmission-Based Precautions: No Implantable device outside of the clinic excluding No Patient Has Alerts: No cellular tissue based products placed in the center since last visit: Has Dressing in Place as Prescribed: Yes Has Compression in Place as Prescribed: Yes Pain Present Now: No Electronic Signature(s) Signed: 02/12/2023 4:56:42 PM By: Thayer Dallas Entered By: Thayer Dallas on 02/12/2023 09:48:56 -------------------------------------------------------------------------------- Compression Therapy Details Patient Name: Date of Service: [name] [name]. 02/12/2023 9:30 A M Medical Record Number: [id number] Patient Account Number: [id number] Date of Birth/Sex: Treating RN: Mar 24, 1971 (52 y.o. [name] Primary Care Brittney Mucha: Barbette Reichmann Other Clinician: Referring Cheyla Duchemin: Treating Chenelle Benning/Extender: Adolph Pollack Weeks in Treatment:  2 Compression Therapy Performed for Wound Assessment: Wound #1 Right,Lateral Lower Leg Performed By: Clinician Redmond Pulling, RN Compression Type: Three Layer Post Procedure Diagnosis Same as Pre-procedure Electronic Signature(s) Signed: 02/12/2023 5:06:56 PM By: Redmond Pulling RN, BSN Entered By: Redmond Pulling on 02/12/2023 10:22:33 [name] ([id number]) [id number] Page 2 of 10 -------------------------------------------------------------------------------- Compression Therapy Details Patient Name: Date of Service: [name] 02/12/2023 9:30 A M Medical Record Number: [id number] Patient Account Number: [id number] Date of Birth/Sex: Treating RN: 1971/07/01 (52 y.o. [name] Primary Care Lawan Nanez: Barbette Reichmann Other Clinician: Referring Alajiah Dutkiewicz: Treating Jamaar Howes/Extender: Adolph Pollack Weeks in Treatment: 2 Compression Therapy Performed for Wound Assessment: Wound #2 Left,Posterior Lower Leg Performed By: Clinician Redmond Pulling, RN Compression Type: Three Layer Post Procedure Diagnosis Same as Pre-procedure Electronic Signature(s) Signed: 02/12/2023 5:06:56 PM By: Redmond Pulling RN, BSN Entered By: Redmond Pulling on 02/12/2023 10:22:33 -------------------------------------------------------------------------------- Encounter Discharge Information Details Patient Name: Date of Service: [name] [name]. 02/12/2023 9:30 A M Medical Record Number: [id number] Patient Account Number: [id number] Date of Birth/Sex: Treating RN: 02-01-1971 (52 y.o. [name] Primary Care Morton Simson: Barbette Reichmann Other Clinician: Referring Kamilo Och: Treating Jeromey Kruer/Extender: Adolph Pollack Weeks in Treatment: 2 Encounter Discharge Information Items Post Procedure Vitals Discharge Condition: Stable Temperature (F): 98.2 Ambulatory Status: Ambulatory Pulse (bpm): 64 Discharge  Destination: Home Respiratory Rate (breaths/min): 18 Transportation: Private Auto Blood Pressure (mmHg): 152/89 Accompanied By: self Schedule Follow-up Appointment: Yes Clinical Summary of Care: Patient Declined Electronic Signature(s) Signed: 02/12/2023 5:06:56 PM By: Redmond Pulling RN, BSN Entered By: Redmond Pulling on 02/12/2023 13:33:36 -------------------------------------------------------------------------------- Lower Extremity Assessment Details Patient Name: Date of Service: [name] [name]. 02/12/2023 9:30 A M Medical Record Number: [id number] Patient Account Number: [id number] Date of Birth/Sex: Treating RN: 1970/09/15 (52 y.o. M) Primary Care Zedekiah Hinderman: Barbette Reichmann Other Clinician: Referring Manly Nestle: Treating Adrin Julian/Extender: Marcha Dutton, Vishwanath Weeks in Treatment: 2 Edema Assessment Assessed: [Left: No] [Right: No] Edema: [Left: Yes] [Right:  Yes] [name], [name] ([id number]) [id number] Page 3 of 10 Left: Right: Point of Measurement: 34 cm From Medial Instep 35.5 cm 35 cm Ankle Left: Right: Point of Measurement: 10 cm From Medial Instep 22.5 cm 23 cm Vascular Assessment Extremity colors, hair growth, and conditions: Extremity Color: [Left:Normal] [Right:Normal] Hair Growth on Extremity: [Left:No] [Right:No] Temperature of Extremity: [Left:Warm] [Right:Warm] Capillary Refill: [Left:< 3 seconds] [Right:< 3 seconds] Dependent Rubor: [Left:No No] [Right:No No] Electronic Signature(s) Signed: 02/12/2023 4:56:42 PM By: Thayer Dallas Entered By: Thayer Dallas on 02/12/2023 10:08:40 -------------------------------------------------------------------------------- Multi Wound Chart Details Patient Name: Date of Service: [name] [name]. 02/12/2023 9:30 A M Medical Record Number: [id number] Patient Account Number: [id number] Date of Birth/Sex: Treating RN: 02/13/1971 (52 y.o. M) Primary Care Hafsa Lohn:  Barbette Reichmann Other Clinician: Referring Taletha Twiford: Treating Rickia Freeburg/Extender: Marcha Dutton, Vishwanath Weeks in Treatment: 2 Vital Signs Height(in): 70 Pulse(bpm): 64 Weight(lbs): 264 Blood Pressure(mmHg): 152/89 Body Mass Index(BMI): 37.9 Temperature(F): 98.2 Respiratory Rate(breaths/min): 18 [1:Photos:] [N/A:N/A] Right, Lateral Lower Leg Left, Posterior Lower Leg N/A Wound Location: Other Lesion Bite N/A Wounding Event: Diabetic Wound/Ulcer of the Lower Diabetic Wound/Ulcer of the Lower N/A Primary Etiology: Extremity Extremity Anemia, Deep Vein Thrombosis, Anemia, Deep Vein Thrombosis, N/A Comorbid History: Hypertension, Peripheral Venous Hypertension, Peripheral Venous Disease, Type II Diabetes, Gout Disease, Type II Diabetes, Gout 01/01/2018 09/14/2022 N/A Date Acquired: 2 2 N/A Weeks of Treatment: Open Open N/A Wound Status: No No N/A Wound Recurrence: 5.4x1.8x0.3 1x0.6x0.1 N/A Measurements L x W x D (cm) 7.634 0.471 N/A A (cm) : rea 2.29 0.047 N/A Volume (cm) : 41.60% 85.70% N/A % Reduction in A rea: 56.20% 85.80% N/A % Reduction in Volume: Grade 2 Grade 1 N/A Classification: Medium Medium N/A Exudate A mount: Serosanguineous Serosanguineous N/A Exudate TypeNATANIEL, PESSOLANO ([id number]) [id number] Page 4 of 10 red, brown red, brown N/A Exudate Color: Distinct, outline attached Distinct, outline attached N/A Wound Margin: Large (67-100%) Large (67-100%) N/A Granulation Amount: Red, Pink Red N/A Granulation Quality: Small (1-33%) None Present (0%) N/A Necrotic Amount: Fat Layer (Subcutaneous Tissue): Yes Fat Layer (Subcutaneous Tissue): Yes N/A Exposed Structures: Fascia: No Fascia: No Tendon: No Tendon: No Muscle: No Muscle: No Joint: No Joint: No Bone: No Bone: No Small (1-33%) Small (1-33%) N/A Epithelialization: Debridement - Excisional N/A N/A Debridement: Pre-procedure Verification/Time  Out 10:15 N/A N/A Taken: Lidocaine 5% topical ointment N/A N/A Pain Control: Subcutaneous, Slough N/A N/A Tissue Debrided: Skin/Subcutaneous Tissue N/A N/A Level: 7.63 N/A N/A Debridement A (sq cm): rea Curette N/A N/A Instrument: Minimum N/A N/A Bleeding: Pressure N/A N/A Hemostasis A chieved: Procedure was tolerated well N/A N/A Debridement Treatment Response: 5.4x1.8x0.1 N/A N/A Post Debridement Measurements L x W x D (cm) 0.763 N/A N/A Post Debridement Volume: (cm) Excoriation: No Excoriation: No N/A Periwound Skin Texture: Induration: No Induration: No Callus: No Callus: No Crepitus: No Crepitus: No Rash: No Rash: No Scarring: No Scarring: No Maceration: No Maceration: No N/A Periwound Skin Moisture: Dry/Scaly: No Dry/Scaly: No Hemosiderin Staining: Yes Hemosiderin Staining: Yes N/A Periwound Skin Color: Atrophie Blanche: No Atrophie Blanche: No Cyanosis: No Cyanosis: No Ecchymosis: No Ecchymosis: No Erythema: No Erythema: No Mottled: No Mottled: No Pallor: No Pallor: No Rubor: No Rubor: No No Abnormality No Abnormality N/A Temperature: Compression Therapy Chemical Cauterization N/A Procedures Performed: Debridement Compression Therapy Treatment Notes Electronic Signature(s) Signed: 02/12/2023 12:36:28 PM By: Geralyn Corwin DO Entered By: Geralyn Corwin on 02/12/2023 10:49:39 -------------------------------------------------------------------------------- Multi-Disciplinary Care Plan Details Patient Name: Date of Service: [name]  Rondall Price 02/12/2023 9:30 A M Medical Record Number: [id number] Patient Account Number: [id number] Date of Birth/Sex: Treating RN: 07-Feb-1971 (52 y.o. [name] Primary Care Jaclyn Andy: Barbette Reichmann Other Clinician: Referring Lashonda Sonneborn: Treating Kadarious Dikes/Extender: Adolph Pollack Weeks in Treatment: 2 Active Inactive Wound/Skin Impairment Nursing Diagnoses: Impaired  tissue integrity Goals: Patient/caregiver will verbalize understanding of skin care regimen Date Initiated: 01/25/2023 Target Resolution Date: 05/16/2023 [name], [name] ([id number]) [phone].pdf Page 5 of 10 Goal Status: Active Interventions: Assess ulceration(s) every visit Treatment Activities: Skin care regimen initiated : 01/25/2023 Notes: Electronic Signature(s) Signed: 02/12/2023 5:06:56 PM By: Redmond Pulling RN, BSN Entered By: Redmond Pulling on 02/12/2023 10:15:04 -------------------------------------------------------------------------------- Pain Assessment Details Patient Name: Date of Service: [name] [name]. 02/12/2023 9:30 A M Medical Record Number: [id number] Patient Account Number: [id number] Date of Birth/Sex: Treating RN: 29-May-1971 (52 y.o. M) Primary Care Danae Oland: Barbette Reichmann Other Clinician: Referring Seleny Allbright: Treating Dayanira Giovannetti/Extender: Adolph Pollack Weeks in Treatment: 2 Active Problems Location of Pain Severity and Description of Pain Patient Has Paino No Site Locations Pain Management and Medication Current Pain Management: Electronic Signature(s) Signed: 02/12/2023 4:56:42 PM By: Thayer Dallas Entered By: Thayer Dallas on 02/12/2023 09:50:45 -------------------------------------------------------------------------------- Patient/Caregiver Education Details Patient Name: Date of Service: [name] 7/30/2024andnbsp9:30 A M Medical Record Number: [id number] Patient Account Number: [id number] Date of Birth/Gender: Treating RN: April 20, 1971 (52 y.o. Ponce, [name], [name] ([id number]) [id number] Page 6 of 10 Primary Care Physician: Barbette Reichmann Other Clinician: Referring Physician: Treating Physician/Extender: Adolph Pollack Weeks in Treatment: 2 Education Assessment Education Provided To: Patient Education  Topics Provided Wound/Skin Impairment: Methods: Explain/Verbal Responses: State content correctly Electronic Signature(s) Signed: 02/12/2023 5:06:56 PM By: Redmond Pulling RN, BSN Entered By: Redmond Pulling on 02/12/2023 10:15:54 -------------------------------------------------------------------------------- Wound Assessment Details Patient Name: Date of Service: [name] [name]. 02/12/2023 9:30 A M Medical Record Number: [id number] Patient Account Number: [id number] Date of Birth/Sex: Treating RN: 1971-05-19 (52 y.o. M) Primary Care Treyshawn Muldrew: Barbette Reichmann Other Clinician: Referring Quavon Keisling: Treating Tacy Chavis/Extender: Marcha Dutton, Vishwanath Weeks in Treatment: 2 Wound Status Wound Number: 1 Primary Diabetic Wound/Ulcer of the Lower Extremity Etiology: Wound Location: Right, Lateral Lower Leg Wound Open Wounding Event: Other Lesion Status: Date Acquired: 01/01/2018 Notes: Pt. 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 Weeks Of Treatment: 2 Comorbid Anemia, Deep Vein Thrombosis, Hypertension, Peripheral Venous Clustered Wound: No History: Disease, Type II Diabetes, Gout Photos Wound Measurements Length: (cm) 5.4 Width: (cm) 1.8 Depth: (cm) 0.3 Area: (cm) 7.634 Volume: (cm) 2.29 % Reduction in Area: 41.6% % Reduction in Volume: 56.2% Epithelialization: Small (1-33%) Tunneling: No Undermining: No Wound Description Classification: Grade 2 Wound Margin: Distinct, outline attached Exudate Amount: Medium Exudate Type: Serosanguineous Exudate Color: red, brown [name], [name] ([id number]) Foul Odor After Cleansing: No Slough/Fibrino Yes [phone].pdf Page 7 of 10 Wound Bed Granulation Amount: Large (67-100%) Exposed Structure Granulation Quality: Red, Pink Fascia Exposed: No Necrotic Amount: Small (1-33%) Fat Layer (Subcutaneous Tissue) Exposed: Yes Necrotic Quality: Adherent Slough Tendon Exposed: No Muscle  Exposed: No Joint Exposed: No Bone Exposed: No Periwound Skin Texture Texture Color No Abnormalities Noted: No No Abnormalities Noted: No Callus: No Atrophie Blanche: No Crepitus: No Cyanosis: No Excoriation: No Ecchymosis: No Induration: No Erythema: No Rash: No Hemosiderin Staining: Yes Scarring: No Mottled: No Pallor: No Moisture Rubor: No No Abnormalities Noted: No Dry / Scaly: No Temperature / Pain Maceration: No Temperature: No Abnormality Treatment Notes Wound #  1 (Lower Leg) Wound Laterality: Right, Lateral Cleanser Soap and Water Discharge Instruction: May shower and wash wound with dial antibacterial soap and water prior to dressing change. Vashe 5.8 (oz) Discharge Instruction: Cleanse the wound with Vashe prior to applying a clean dressing using gauze sponges, not tissue or cotton balls. Peri-Wound Care Sween Lotion (Moisturizing lotion) Discharge Instruction: Apply moisturizing lotion as directed Topical Gentamicin Discharge Instruction: As directed by physician Mupirocin Ointment Discharge Instruction: Apply Mupirocin (Bactroban) as instructed Primary Dressing Hydrofera Blue Ready Transfer Foam, 4x5 (in/in) Discharge Instruction: Apply to wound bed as instructed Secondary Dressing ABD Pad, 8x10 Discharge Instruction: Apply over primary dressing as directed. Woven Gauze Sponge, Non-Sterile 4x4 in Discharge Instruction: Apply over primary dressing as directed. Secured With Compression Wrap Urgo K2 Lite, (equivalent to a 3 layer) two layer compression system, regular Discharge Instruction: or Apply Urgo K2 Lite as directed (alternative to 3 layer compression). ThreePress (3 layer compression wrap) Discharge Instruction: Apply three layer compression as directed. Stockinette Compression Stockings Facilities manager) Signed: 02/12/2023 4:56:42 PM By: Thayer Dallas Entered By: Thayer Dallas on 02/12/2023 10:12:20 [name]  ([id number]) [id number] Page 8 of 10 -------------------------------------------------------------------------------- Wound Assessment Details Patient Name: Date of Service: [name] 02/12/2023 9:30 A M Medical Record Number: [id number] Patient Account Number: [id number] Date of Birth/Sex: Treating RN: 1971/06/17 (52 y.o. M) Primary Care Lanasia Porras: Barbette Reichmann Other Clinician: Referring Captain Blucher: Treating Shanyia Stines/Extender: Marcha Dutton, Vishwanath Weeks in Treatment: 2 Wound Status Wound Number: 2 Primary Diabetic Wound/Ulcer of the Lower Extremity Etiology: Wound Location: Left, Posterior Lower Leg Wound Open Wounding Event: Bite Status: Date Acquired: 09/14/2022 Comorbid Anemia, Deep Vein Thrombosis, Hypertension, Peripheral Venous Weeks Of Treatment: 2 History: Disease, Type II Diabetes, Gout Clustered Wound: No Photos Wound Measurements Length: (cm) 1 Width: (cm) 0. Depth: (cm) 0. Area: (cm) 0 Volume: (cm) 0 % Reduction in Area: 85.7% 6 % Reduction in Volume: 85.8% 1 Epithelialization: Small (1-33%) .471 Tunneling: No .047 Undermining: No Wound Description Classification: Grade 1 Wound Margin: Distinct, outline attached Exudate Amount: Medium Exudate Type: Serosanguineous Exudate Color: red, brown Foul Odor After Cleansing: No Slough/Fibrino Yes Wound Bed Granulation Amount: Large (67-100%) Exposed Structure Granulation Quality: Red Fascia Exposed: No Necrotic Amount: None Present (0%) Fat Layer (Subcutaneous Tissue) Exposed: Yes Tendon Exposed: No Muscle Exposed: No Joint Exposed: No Bone Exposed: No Periwound Skin Texture Texture Color No Abnormalities Noted: No No Abnormalities Noted: No Callus: No Atrophie Blanche: No Crepitus: No Cyanosis: No Excoriation: No Ecchymosis: No Induration: No Erythema: No Rash: No Hemosiderin Staining: Yes Scarring: No Mottled: No Pallor: No Moisture Rubor:  No No Abnormalities Noted: No Dry / Scaly: No Temperature / Pain Maceration: No Temperature: No Abnormality [name], [name] ([id number]) [id number] Page 9 of 10 Treatment Notes Wound #2 (Lower Leg) Wound Laterality: Left, Posterior Cleanser Soap and Water Discharge Instruction: May shower and wash wound with dial antibacterial soap and water prior to dressing change. Vashe 5.8 (oz) Discharge Instruction: Cleanse the wound with Vashe prior to applying a clean dressing using gauze sponges, not tissue or cotton balls. Peri-Wound Care Sween Lotion (Moisturizing lotion) Discharge Instruction: Apply moisturizing lotion as directed Topical Gentamicin Discharge Instruction: As directed by physician Mupirocin Ointment Discharge Instruction: Apply Mupirocin (Bactroban) as instructed Primary Dressing Hydrofera Blue Ready Transfer Foam, 4x5 (in/in) Discharge Instruction: Apply to wound bed as instructed Secondary Dressing ABD Pad, 8x10 Discharge Instruction: Apply over primary dressing as directed. Woven Gauze Sponge, Non-Sterile 4x4 in Discharge Instruction: Apply  over primary dressing as directed. Secured With Compression Wrap Urgo K2 Lite, (equivalent to a 3 layer) two layer compression system, regular Discharge Instruction: or Apply Urgo K2 Lite as directed (alternative to 3 layer compression). ThreePress (3 layer compression wrap) Discharge Instruction: Apply three layer compression as directed. Stock[employer]ette Compression Stockings Facilities manager) Signed: 02/12/2023 4:56:42 PM By: Thayer Dallas Entered By: Thayer Dallas on 02/12/2023 10:14:26 -------------------------------------------------------------------------------- Vitals Details Patient Name: Date of Service: [name] [name]. 02/12/2023 9:30 A M Medical Record Number: [id number] Patient Account Number: [id number] Date of Birth/Sex: Treating RN: 06-26-1971 (52 y.o.  M) Primary Care Maleigh Bagot: Barbette Reichmann Other Clinician: Referring Martine Bleecker: Treating Diandre Merica/Extender: Marcha Dutton, Vishwanath Weeks in Treatment: 2 Vital Signs Time Taken: 09:48 Temperature (F): 98.2 Height (in): 70 Pulse (bpm): 64 Weight (lbs): 264 Respiratory Rate (breaths/min): 18 Body Mass Index (BMI): 37.9 Blood Pressure (mmHg): 152/89 Reference Range: 80 - 120 mg / dl Electronic Signature(s) [name], [name] ([id number]) [phone].pdf Page 10 of 10 Signed: 02/12/2023 4:56:42 PM By: Thayer Dallas Entered By: Thayer Dallas on 02/12/2023 09:50:40

## 2023-02-19 ENCOUNTER — Encounter (HOSPITAL_BASED_OUTPATIENT_CLINIC_OR_DEPARTMENT_OTHER): Payer: Medicaid Other | Attending: Internal Medicine | Admitting: Internal Medicine

## 2023-02-19 DIAGNOSIS — L97812 Non-pressure chronic ulcer of other part of right lower leg with fat layer exposed: Secondary | ICD-10-CM | POA: Diagnosis not present

## 2023-02-19 DIAGNOSIS — E11622 Type 2 diabetes mellitus with other skin ulcer: Secondary | ICD-10-CM | POA: Diagnosis not present

## 2023-02-19 DIAGNOSIS — I87313 Chronic venous hypertension (idiopathic) with ulcer of bilateral lower extremity: Secondary | ICD-10-CM | POA: Insufficient documentation

## 2023-02-19 DIAGNOSIS — Z794 Long term (current) use of insulin: Secondary | ICD-10-CM | POA: Insufficient documentation

## 2023-02-19 DIAGNOSIS — Z8249 Family history of ischemic heart disease and other diseases of the circulatory system: Secondary | ICD-10-CM | POA: Insufficient documentation

## 2023-02-19 DIAGNOSIS — I129 Hypertensive chronic kidney disease with stage 1 through stage 4 chronic kidney disease, or unspecified chronic kidney disease: Secondary | ICD-10-CM | POA: Insufficient documentation

## 2023-02-19 DIAGNOSIS — E1122 Type 2 diabetes mellitus with diabetic chronic kidney disease: Secondary | ICD-10-CM | POA: Insufficient documentation

## 2023-02-19 DIAGNOSIS — Z86718 Personal history of other venous thrombosis and embolism: Secondary | ICD-10-CM | POA: Insufficient documentation

## 2023-02-19 DIAGNOSIS — L97822 Non-pressure chronic ulcer of other part of left lower leg with fat layer exposed: Secondary | ICD-10-CM | POA: Diagnosis not present

## 2023-02-19 DIAGNOSIS — N182 Chronic kidney disease, stage 2 (mild): Secondary | ICD-10-CM | POA: Diagnosis not present

## 2023-02-19 NOTE — Progress Notes (Addendum)
LAMIN, MCQUEARY ([id number]) [id number] Page 1 of 10 Visit Report for 02/19/2023 Chief Complaint Document Details Patient Name: Date of Service: [name] 02/19/2023 9:30 A M Medical Record Number: [id number] Patient Account Number: [id number] Date of Birth/Sex: Treating RN: 1971/03/20 (52 y.o. M) Primary Care Provider: Barbette Price Other Clinician: Referring Provider: Treating Provider/Extender: [name] Weeks in Treatment: 3 Information Obtained from: Patient Chief Complaint 01/25/2023; bilateral lower extremity wounds Electronic Signature(s) Signed: 02/19/2023 1:32:32 PM By: [name] Corwin DO Entered By: [name] on 02/19/2023 11:36:30 -------------------------------------------------------------------------------- Debridement Details Patient Name: Date of Service: [name] 02/19/2023 9:30 A M Medical Record Number: [id number] Patient Account Number: [id number] Date of Birth/Sex: Treating RN: December 26, 1970 (52 y.o. [name] Primary Care Provider: Barbette Price Other Clinician: Referring Provider: Treating Provider/Extender: [name] Weeks in Treatment: 3 Debridement Performed for Assessment: Wound #1 Right,Lateral Lower Leg Performed By: Physician [name] Corwin, DO Debridement Type: Debridement Severity of Tissue Pre Debridement: Fat layer exposed Level of Consciousness (Pre-procedure): Awake and Alert Pre-procedure Verification/Time Out Yes - 10:18 Taken: Start Time: 10:21 Pain Control: Lidocaine 5% topical ointment Percent of Wound Bed Debrided: 100% T Area Debrided (cm): otal 7.61 Tissue and other material debrided: Non-Viable, Slough, Subcutaneous, Slough Level: Skin/Subcutaneous Tissue Debridement Description: Excisional Instrument: Curette Bleeding: Minimum Hemostasis Achieved: Pressure Response to Treatment: Procedure was tolerated  well Level of Consciousness (Post- Awake and Alert procedure): Post Debridement Measurements of Total Wound Length: (cm) 5.1 Width: (cm) 1.9 Depth: (cm) 0.2 Volume: (cm) 1.522 Character of Wound/Ulcer Post Debridement: Improved Severity of Tissue Post Debridement: Fat layer exposed Post Procedure Diagnosis [name] ([id number]) [id number] Page 2 of 10 Same as Pre-procedure Electronic Signature(s) Signed: 02/19/2023 1:32:32 PM By: [name] Corwin DO Signed: 02/19/2023 5:25:55 PM By: [name] Pulling RN, BSN Entered By: [name] on 02/19/2023 10:24:15 -------------------------------------------------------------------------------- HPI Details Patient Name: Date of Service: [name] 02/19/2023 9:30 A M Medical Record Number: [id number] Patient Account Number: [id number] Date of Birth/Sex: Treating RN: 01-Jun-1971 (52 y.o. M) Primary Care Provider: Barbette Price Other Clinician: Referring Provider: Treating Provider/Extender: [name] Weeks in Treatment: 3 History of Present Illness HPI Description: 01/25/2023 [name] is a 52 year old male with a past medical history of controlled type 2 diabetes on insulin, venous insufficiency and DVT that presents to the clinic for bilateral lower extremity wounds. He states that the wound on the left was started by a bug bite and has been present for the past 4 months. The right lower extremity wound he states started out as a soft tissue infection and has been present for the past 6 years. He has been treated by a wound care center in [place] [facility] in [place]. He was last seen in the wound care center 2 years ago Could not continue following up due to lack of transportation. He states that recently the rehab center has been wrapping his legs with compression wrap and a wound dressing underneath. It is unclear when the last time this  happened was. He is not wearing any compression today. He currently denies signs of infection. 7/19; patient presents for follow-up. We have been using antibiotic ointment with Hydrofera Blue under 3 layer compression to the lower extremities bilaterally. Wounds are smaller. 7/30; patient presents for follow-up. We have been using antibiotic ointment and Hydrofera Blue under 3 layer compression to the lower extremities bilaterally. Wounds are smaller. 8/6; patient presents for follow-up.  We have been using antibiotic ointment with Hydrofera Blue under 3 layer compression to the lower extremities bilaterally. Wounds are stable. He denies signs of infection. Electronic Signature(s) Signed: 02/19/2023 1:32:32 PM By: [name] Corwin DO Entered By: [name] on 02/19/2023 12:23:57 -------------------------------------------------------------------------------- Chemical Cauterization Details Patient Name: Date of Service: [name] 02/19/2023 9:30 A M Medical Record Number: [id number] Patient Account Number: [id number] Date of Birth/Sex: Treating RN: 1971/03/20 (52 y.o. [name] Primary Care Provider: Barbette Price Other Clinician: Referring Provider: Treating Provider/Extender: [name] Weeks in Treatment: 3 Procedure Performed for: Wound #2 Left,Posterior Lower Leg Performed By: Physician [name] Corwin, DO Post Procedure Diagnosis Same as Pre-procedure Notes Silver nitrate to hypertrophic granulation. Scribed for DR [name] by [name] Pulling, RN [name] Price ([id number]) [id number] Page 3 of 10 Electronic Signature(s) Signed: 02/19/2023 1:32:32 PM By: [name] Corwin DO Signed: 02/19/2023 5:25:55 PM By: [name] Pulling RN, BSN Entered By: [name] on 02/19/2023 10:23:05 -------------------------------------------------------------------------------- Physical Exam Details Patient Name: Date of  Service: [name] 02/19/2023 9:30 A M Medical Record Number: [id number] Patient Account Number: [id number] Date of Birth/Sex: Treating RN: 11-15-1970 (52 y.o. M) Primary Care Provider: Barbette Price Other Clinician: Referring Provider: Treating Provider/Extender: [name], [name] Weeks in Treatment: 3 Constitutional respirations regular, non-labored and within target range for patient.. Cardiovascular 2+ dorsalis pedis/posterior tibialis pulses. Psychiatric pleasant and cooperative. Notes Right lower extremity: T the posterior aspect there is an open wound with granulation tissue and nonviable tissue. Good edema control . No surrounding signs o of infection including increased warmth, erythema or purulent drainage. Left lower extremity: T the posterior aspect there is an open wound with granulation o tissue and areas of hyper granulated tissue. No signs of surrounding soft tissue infection including increased warmth, erythema or purulent drainage. Electronic Signature(s) Signed: 02/19/2023 1:32:32 PM By: [name] Corwin DO Entered By: [name] on 02/19/2023 12:27:54 -------------------------------------------------------------------------------- Physician Orders Details Patient Name: Date of Service: [name] 02/19/2023 9:30 A M Medical Record Number: [id number] Patient Account Number: [id number] Date of Birth/Sex: Treating RN: [birth year] (53 y.o. [name] Primary Care Provider: Barbette Price Other Clinician: Referring Provider: Treating Provider/Extender: [name] Weeks in Treatment: 3 Verbal / Phone Orders: No Diagnosis Coding Follow-up Appointments ppointment in 1 week. - Dr. Mikey Price Room 9 02/28/24 Thursday @ 3:00 Return A ppointment in 2 weeks. - Dr [name] Rm 9 03/05/23 Tuesday 0930 Return A Anesthetic (In clinic) Topical Lidocaine 4% applied to wound bed Cellular or Tissue Based  Products Wound #1 Right,Lateral Lower Leg Other Cellular or Tissue Based Products Orders/Instructions: - Run insurance verification for skin substitute Bathing/ Shower/ Hygiene May shower with protection but do not get wound dressing(s) wet. Protect dressing(s) with water repellant cover (for example, large plastic bag) or a cast cover and may then take shower. - Please do not get the legs wet. Please use cast protectors when showering. Cast protectors can be purchased from [employer], Medical supply store. Cost ranges from $17-$37 CATHY, FURY ([id number]) [phone].pdf Page 4 of 10 Edema Control - Lymphedema / SCD / Other Bilateral Lower Extremities Lymphedema Pumps. Use Lymphedema pumps on leg(s) 2-3 times a day for 45-60 minutes. If wearing any wraps or hose, do not remove them. Continue exercising as instructed. - Keep using the Lymphadema Pumps.You may place the th Lyphadema pumps over the compression wraps. Elevate legs to the level of the heart or above for 30 minutes  daily and/or when sitting for 3-4 times a day throughout the day. Avoid standing for long periods of time. Exercise regularly - As tolerated Off-Loading Other: - Keep legs elevated to heart level or above when sitting. You may place a pillow behind the calf for comfort, if so desired. Wound Treatment Wound #1 - Lower Leg Wound Laterality: Right, Lateral Cleanser: Soap and Water 1 x Per Week/30 Days Discharge Instructions: May shower and wash wound with dial antibacterial soap and water prior to dressing change. Cleanser: Vashe 5.8 (oz) 1 x Per Week/30 Days Discharge Instructions: Cleanse the wound with Vashe prior to applying a clean dressing using gauze sponges, not tissue or cotton balls. Peri-Wound Care: Sween Lotion (Moisturizing lotion) 1 x Per Week/30 Days Discharge Instructions: Apply moisturizing lotion as directed Prim Dressing: Promogran Prisma Matrix, 4.34  (sq in) (silver collagen) 1 x Per Week/30 Days ary Discharge Instructions: Moisten collagen with saline or hydrogel Secondary Dressing: ABD Pad, 8x10 1 x Per Week/30 Days Discharge Instructions: Apply over primary dressing as directed. Secondary Dressing: Woven Gauze Sponge, Non-Sterile 4x4 in 1 x Per Week/30 Days Discharge Instructions: Apply over primary dressing as directed. Compression Wrap: Urgo K2 Lite, (equivalent to a 3 layer) two layer compression system, regular 1 x Per Week/30 Days Discharge Instructions: or Apply Urgo K2 Lite as directed (alternative to 3 layer compression). Compression Wrap: ThreePress (3 layer compression wrap) 1 x Per Week/30 Days Discharge Instructions: Apply three layer compression as directed. Compression Wrap: Stockinette 1 x Per Week/30 Days Wound #2 - Lower Leg Wound Laterality: Left, Posterior Cleanser: Soap and Water 1 x Per Week/30 Days Discharge Instructions: May shower and wash wound with dial antibacterial soap and water prior to dressing change. Cleanser: Vashe 5.8 (oz) 1 x Per Week/30 Days Discharge Instructions: Cleanse the wound with Vashe prior to applying a clean dressing using gauze sponges, not tissue or cotton balls. Peri-Wound Care: Sween Lotion (Moisturizing lotion) 1 x Per Week/30 Days Discharge Instructions: Apply moisturizing lotion as directed Prim Dressing: PolyMem Silver Non-Adhesive Dressing, 4.25x4.25 in 1 x Per Week/30 Days ary Discharge Instructions: Apply to wound bed as instructed Secondary Dressing: ABD Pad, 8x10 1 x Per Week/30 Days Discharge Instructions: Apply over primary dressing as directed. Secondary Dressing: Woven Gauze Sponge, Non-Sterile 4x4 in 1 x Per Week/30 Days Discharge Instructions: Apply over primary dressing as directed. Compression Wrap: Urgo K2 Lite, (equivalent to a 3 layer) two layer compression system, regular 1 x Per Week/30 Days Discharge Instructions: or Apply Urgo K2 Lite as directed  (alternative to 3 layer compression). Compression Wrap: ThreePress (3 layer compression wrap) 1 x Per Week/30 Days Discharge Instructions: Apply three layer compression as directed. Compression Wrap: Stockinette 1 x Per Week/30 Days Patient Medications llergies: No Known Drug Allergies A Notifications Medication Indication Start End 02/19/2023 lidocaine DOSE topical 5 % ointment - ointment topical once daily MARGO, NAULA ([id number]) [phone].pdf Page 5 of 10 Electronic Signature(s) Signed: 02/19/2023 1:32:32 PM By: [name] Corwin DO Entered By: [name] on 02/19/2023 12:28:02 -------------------------------------------------------------------------------- Problem List Details Patient Name: Date of Service: [name] 02/19/2023 9:30 A M Medical Record Number: [id number] Patient Account Number: [id number] Date of Birth/Sex: Treating RN: 10/19/1970 (52 y.o. M) Primary Care Provider: Barbette Price Other Clinician: Referring Provider: Treating Provider/Extender: [name] Weeks in Treatment: 3 Active Problems ICD-10 Encounter Code Description Active Date MDM Diagnosis L97.812 Non-pressure chronic ulcer of other part of right lower leg with fat layer 01/25/2023 No Yes  exposed G64.403 Non-pressure chronic ulcer of other part of left lower leg with fat layer exposed7/06/2023 No Yes E11.622 Type 2 diabetes mellitus with other skin ulcer 01/25/2023 No Yes I87.313 Chronic venous hypertension (idiopathic) with ulcer of bilateral lower extremity 01/25/2023 No Yes Inactive Problems Resolved Problems Electronic Signature(s) Signed: 02/19/2023 1:32:32 PM By: [name] Corwin DO Entered By: [name] on 02/19/2023 11:36:14 -------------------------------------------------------------------------------- Progress Note Details Patient Name: Date of Service: [name] 02/19/2023 9:30 A M Medical Record  Number: [id number] Patient Account Number: [id number] Date of Birth/Sex: Treating RN: 1970/11/20 (52 y.o. M) Primary Care Provider: Barbette Price Other Clinician: Referring Provider: Treating Provider/Extender: [name] Weeks in Treatment: 3 Subjective Chief Complaint Price, [name] ([id number]) [id number] Page 6 of 10 Information obtained from Patient 01/25/2023; bilateral lower extremity wounds History of Present Illness (HPI) 01/25/2023 [name] is a 52 year old male with a past medical history of controlled type 2 diabetes on insulin, venous insufficiency and DVT that presents to the clinic for bilateral lower extremity wounds. He states that the wound on the left was started by a bug bite and has been present for the past 4 months. The right lower extremity wound he states started out as a soft tissue infection and has been present for the past 6 years. He has been treated by a wound care center in [place] [facility] in [place]. He was last seen in the wound care center 2 years ago Could not continue following up due to lack of transportation. He states that recently the rehab center has been wrapping his legs with compression wrap and a wound dressing underneath. It is unclear when the last time this happened was. He is not wearing any compression today. He currently denies signs of infection. 7/19; patient presents for follow-up. We have been using antibiotic ointment with Hydrofera Blue under 3 layer compression to the lower extremities bilaterally. Wounds are smaller. 7/30; patient presents for follow-up. We have been using antibiotic ointment and Hydrofera Blue under 3 layer compression to the lower extremities bilaterally. Wounds are smaller. 8/6; patient presents for follow-up. We have been using antibiotic ointment with Hydrofera Blue under 3 layer compression to the lower extremities  bilaterally. Wounds are stable. He denies signs of infection. Patient History Information obtained from Patient. Family History Heart Disease, Hypertension - Mother,Siblings, Kidney Disease - Father,Siblings, Stroke - Mother, No family history of Diabetes. Social History Never smoker, Marital Status - Single, Alcohol Use - Never, Drug Use - No History, Caffeine Use - Moderate - soda,coffee. Medical History Hematologic/Lymphatic Patient has history of Anemia Cardiovascular Patient has history of Deep Vein Thrombosis - both leg, Hypertension, Peripheral Venous Disease Endocrine Patient has history of Type II Diabetes - HgbA1c 6.2 (5/24) Musculoskeletal Patient has history of Gout Hospitalization/Surgery History - scrotal surgery. - right elbow surgery. - right elbow septic arthritis IandD x2 with skin graft. - hernia repair 2022. - 2018 Ecoli sepsis. - necrotizing fasciitis. Medical A Surgical History Notes nd Constitutional Symptoms (General Health) CVA Cardiovascular cellulitis right leg 2018 Genitourinary CKD stage 2 Objective Constitutional respirations regular, non-labored and within target range for patient.. Vitals Time Taken: 9:36 AM, Height: 70 in, Weight: 264 lbs, BMI: 37.9, Temperature: 99.1 F, Pulse: 61 bpm, Respiratory Rate: 18 breaths/min, Blood Pressure: 137/89 mmHg. Cardiovascular 2+ dorsalis pedis/posterior tibialis pulses. Psychiatric pleasant and cooperative. General Notes: Right lower extremity: T the posterior aspect there is an open wound with granulation tissue and nonviable tissue. Good edema  control . No o surrounding signs of infection including increased warmth, erythema or purulent drainage. Left lower extremity: T the posterior aspect there is an open wound o with granulation tissue and areas of hyper granulated tissue. No signs of surrounding soft tissue infection including increased warmth, erythema or purulent drainage. Integumentary (Hair,  Skin) Wound #1 status is Open. Original cause of wound was Other Lesion. The date acquired was: 01/01/2018. The wound has been in treatment 3 weeks. The wound is located on the Right,Lateral Lower Leg. The wound measures 5.1cm length x 1.9cm width x 0.2cm depth; 7.611cm^2 area and 1.522cm^3 volume. There is Fat Layer (Subcutaneous Tissue) exposed. There is no tunneling or undermining noted. There is a medium amount of serosanguineous drainage noted. The wound margin is distinct with the outline attached to the wound base. There is large (67-100%) red, pink granulation within the wound bed. There is no necrotic tissue within the wound bed. The periwound skin appearance exhibited: Hemosiderin Staining. The periwound skin appearance did not exhibit: Callus, Crepitus, Excoriation, Induration, Rash, Scarring, Dry/Scaly, Maceration, Atrophie Blanche, Cyanosis, Ecchymosis, Mottled, Pallor, Rubor, Erythema. Price, [name] ([id number]) [id number] Page 7 of 10 temperature was noted as No Abnormality. Wound #2 status is Open. Original cause of wound was Bite. The date acquired was: 09/14/2022. The wound has been in treatment 3 weeks. The wound is located on the Left,Posterior Lower Leg. The wound measures 1.4cm length x 1.5cm width x 0.1cm depth; 1.649cm^2 area and 0.165cm^3 volume. There is Fat Layer (Subcutaneous Tissue) exposed. There is no tunneling or undermining noted. There is a medium amount of serosanguineous drainage noted. The wound margin is distinct with the outline attached to the wound base. There is large (67-100%) red, friable, hyper - granulation within the wound bed. There is no necrotic tissue within the wound bed. The periwound skin appearance exhibited: Hemosiderin Staining. The periwound skin appearance did not exhibit: Callus, Crepitus, Excoriation, Induration, Rash, Scarring, Dry/Scaly, Maceration, Atrophie Blanche, Cyanosis, Ecchymosis, Mottled,  Pallor, Rubor, Erythema. Periwound temperature was noted as No Abnormality. Assessment Active Problems ICD-10 Non-pressure chronic ulcer of other part of right lower leg with fat layer exposed Non-pressure chronic ulcer of other part of left lower leg with fat layer exposed Type 2 diabetes mellitus with other skin ulcer Chronic venous hypertension (idiopathic) with ulcer of bilateral lower extremity Patient's wounds are stable. I debrided nonviable tissue. I used silver nitrate to the hypergranulating areas. Since wounds have stalled I recommended switching the dressing to collagen on the left leg and PolyMem silver on the right leg and continue compression therapy. Procedures Wound #1 Pre-procedure diagnosis of Wound #1 is a Diabetic Wound/Ulcer of the Lower Extremity located on the Right,Lateral Lower Leg .Severity of Tissue Pre Debridement is: Fat layer exposed. There was a Excisional Skin/Subcutaneous Tissue Debridement with a total area of 7.61 sq cm performed by [name] Corwin, DO. With the following instrument(s): Curette to remove Non-Viable tissue/material. Material removed includes Subcutaneous Tissue and Slough and after achieving pain control using Lidocaine 5% topical ointment. No specimens were taken. A time out was conducted at 10:18, prior to the start of the procedure. A Minimum amount of bleeding was controlled with Pressure. The procedure was tolerated well. Post Debridement Measurements: 5.1cm length x 1.9cm width x 0.2cm depth; 1.522cm^3 volume. Character of Wound/Ulcer Post Debridement is improved. Severity of Tissue Post Debridement is: Fat layer exposed. Post procedure Diagnosis Wound #1: Same as Pre-Procedure Pre-procedure diagnosis of Wound #1 is a Diabetic Wound/Ulcer of  the Lower Extremity located on the Right,Lateral Lower Leg . There was a Three Layer Compression Therapy Procedure by [name] Pulling, RN. Post procedure Diagnosis Wound #1: Same as  Pre-Procedure Wound #2 Pre-procedure diagnosis of Wound #2 is a Diabetic Wound/Ulcer of the Lower Extremity located on the Left,Posterior Lower Leg . There was a Three Layer Compression Therapy Procedure by [name] Pulling, RN. Post procedure Diagnosis Wound #2: Same as Pre-Procedure Pre-procedure diagnosis of Wound #2 is a Diabetic Wound/Ulcer of the Lower Extremity located on the Left,Posterior Lower Leg . An Chemical Cauterization procedure was performed by [name] Corwin, DO. Post procedure Diagnosis Wound #2: Same as Pre-Procedure Notes: Silver nitrate to hypertrophic granulation. Scribed for DR [name] by [name] Pulling, RN Plan Follow-up Appointments: Return Appointment in 1 week. - Dr. Mikey Price Room 9 02/28/24 Thursday @ 3:00 Return Appointment in 2 weeks. - Dr [name] Rm 9 03/05/23 Tuesday 0930 Anesthetic: (In clinic) Topical Lidocaine 4% applied to wound bed Cellular or Tissue Based Products: Wound #1 Right,Lateral Lower Leg: Other Cellular or Tissue Based Products Orders/Instructions: - Run insurance verification for skin substitute Bathing/ Shower/ Hygiene: May shower with protection but do not get wound dressing(s) wet. Protect dressing(s) with water repellant cover (for example, large plastic bag) or a cast cover and may then take shower. - Please do not get the legs wet. Please use cast protectors when showering. Cast protectors can be purchased from [employer], Medical supply store. Cost ranges from $17-$37 Edema Control - Lymphedema / SCD / Other: Lymphedema Pumps. Use Lymphedema pumps on leg(s) 2-3 times a day for 45-60 minutes. If wearing any wraps or hose, do not remove them. Continue exercising as instructed. - Keep using the Lymphadema Pumps.You may place the th Lyphadema pumps over the compression wraps. Elevate legs to the level of the heart or above for 30 minutes daily and/or when sitting for 3-4 times a day throughout the day. Avoid standing for  long periods of time. Exercise regularly - As tolerated Off-Loading: Other: - Keep legs elevated to heart level or above when sitting. You may place a pillow behind the calf for comfort, if so desired. Price, [name] ([id number]) [id number] Page 8 of 10 The following medication(s) was prescribed: lidocaine topical 5 % ointment ointment topical once daily was prescribed at facility WOUND #1: - Lower Leg Wound Laterality: Right, Lateral Cleanser: Soap and Water 1 x Per Week/30 Days Discharge Instructions: May shower and wash wound with dial antibacterial soap and water prior to dressing change. Cleanser: Vashe 5.8 (oz) 1 x Per Week/30 Days Discharge Instructions: Cleanse the wound with Vashe prior to applying a clean dressing using gauze sponges, not tissue or cotton balls. Peri-Wound Care: Sween Lotion (Moisturizing lotion) 1 x Per Week/30 Days Discharge Instructions: Apply moisturizing lotion as directed Prim Dressing: Promogran Prisma Matrix, 4.34 (sq in) (silver collagen) 1 x Per Week/30 Days ary Discharge Instructions: Moisten collagen with saline or hydrogel Secondary Dressing: ABD Pad, 8x10 1 x Per Week/30 Days Discharge Instructions: Apply over primary dressing as directed. Secondary Dressing: Woven Gauze Sponge, Non-Sterile 4x4 in 1 x Per Week/30 Days Discharge Instructions: Apply over primary dressing as directed. Com pression Wrap: Urgo K2 Lite, (equivalent to a 3 layer) two layer compression system, regular 1 x Per Week/30 Days Discharge Instructions: or Apply Urgo K2 Lite as directed (alternative to 3 layer compression). Com pression Wrap: ThreePress (3 layer compression wrap) 1 x Per Week/30 Days Discharge Instructions: Apply three layer compression as directed. Com pression Wrap: Stockinette  1 x Per Week/30 Days WOUND #2: - Lower Leg Wound Laterality: Left, Posterior Cleanser: Soap and Water 1 x Per Week/30 Days Discharge Instructions: May  shower and wash wound with dial antibacterial soap and water prior to dressing change. Cleanser: Vashe 5.8 (oz) 1 x Per Week/30 Days Discharge Instructions: Cleanse the wound with Vashe prior to applying a clean dressing using gauze sponges, not tissue or cotton balls. Peri-Wound Care: Sween Lotion (Moisturizing lotion) 1 x Per Week/30 Days Discharge Instructions: Apply moisturizing lotion as directed Prim Dressing: PolyMem Silver Non-Adhesive Dressing, 4.25x4.25 in 1 x Per Week/30 Days ary Discharge Instructions: Apply to wound bed as instructed Secondary Dressing: ABD Pad, 8x10 1 x Per Week/30 Days Discharge Instructions: Apply over primary dressing as directed. Secondary Dressing: Woven Gauze Sponge, Non-Sterile 4x4 in 1 x Per Week/30 Days Discharge Instructions: Apply over primary dressing as directed. Com pression Wrap: Urgo K2 Lite, (equivalent to a 3 layer) two layer compression system, regular 1 x Per Week/30 Days Discharge Instructions: or Apply Urgo K2 Lite as directed (alternative to 3 layer compression). Com pression Wrap: ThreePress (3 layer compression wrap) 1 x Per Week/30 Days Discharge Instructions: Apply three layer compression as directed. Com pression Wrap: Stockinette 1 x Per Week/30 Days 1. In office sharp debridement 2. Silver nitrate 3. 3 layer compression to lower extremities bilaterally 4. Follow-up in 1 week Electronic Signature(s) Signed: 02/21/2023 1:12:24 PM By: Shawn Stall RN, BSN Signed: 02/22/2023 12:24:18 PM By: [name] Corwin DO Previous Signature: 02/19/2023 1:32:32 PM Version By: [name] Corwin DO Entered By: Shawn Stall on 02/21/2023 12:53:07 -------------------------------------------------------------------------------- HxROS Details Patient Name: Date of Service: [name] 02/19/2023 9:30 A M Medical Record Number: [id number] Patient Account Number: [id number] Date of Birth/Sex: Treating RN: 01-20-1971 (52 y.o. M) Primary Care  Provider: Barbette Price Other Clinician: Referring Provider: Treating Provider/Extender: [name] Weeks in Treatment: 3 Information Obtained From Patient Constitutional Symptoms (General Health) Medical History: Past Medical History Notes: CVA Hematologic/Lymphatic Medical History: Positive for: Anemia [name], Price ([id number]) [id number] Page 9 of 10 Cardiovascular Medical History: Positive for: Deep Vein Thrombosis - both leg; Hypertension; Peripheral Venous Disease Past Medical History Notes: cellulitis right leg 2018 Endocrine Medical History: Positive for: Type II Diabetes - HgbA1c 6.2 (5/24) Time with diabetes: 9 years Treated with: Insulin Blood sugar tested every day: Yes Tested : 3 x day Blood sugar testing results: Breakfast: 97 Genitourinary Medical History: Past Medical History Notes: CKD stage 2 Musculoskeletal Medical History: Positive for: Gout Immunizations Pneumococcal Vaccine: Received Pneumococcal Vaccination: No Implantable Devices None Hospitalization / Surgery History Type of Hospitalization/Surgery scrotal surgery right elbow surgery right elbow septic arthritis IandD x2 with skin graft hernia repair 2022 2018 Ecoli sepsis necrotizing fasciitis Family and Social History Diabetes: No; Heart Disease: Yes; Hypertension: Yes - Mother,Siblings; Kidney Disease: Yes - Father,Siblings; Stroke: Yes - Mother; Never smoker; Marital Status - Single; Alcohol Use: Never; Drug Use: No History; Caffeine Use: Moderate - soda,coffee; Financial Concerns: No; Food, Clothing or Shelter Needs: No; Support System Lacking: No; Transportation Concerns: No Electronic Signature(s) Signed: 02/19/2023 1:32:32 PM By: [name] Corwin DO Entered By: [name] on 02/19/2023 12:24:11 -------------------------------------------------------------------------------- SuperBill Details Patient Name: Date  of Service: [name] 02/19/2023 Medical Record Number: [id number] Patient Account Number: [id number] Date of Birth/Sex: Treating RN: 07/22/1970 (52 y.o. M) Primary Care Provider: Barbette Price Other Clinician: Referring Provider: Treating Provider/Extender: [name], [name] Weeks in Treatment: 3 Diagnosis Coding ICD-10 Codes Code  Description TYTEN, UPTEGROVE ([id number]) [id number] Page 10 of 10 [phone] Non-pressure chronic ulcer of other part of right lower leg with fat layer exposed L97.822 Non-pressure chronic ulcer of other part of left lower leg with fat layer exposed E11.622 Type 2 diabetes mellitus with other skin ulcer I87.313 Chronic venous hypertension (idiopathic) with ulcer of bilateral lower extremity Facility Procedures : CPT4 Code: [id number] Description: 11042 - DEB SUBQ TISSUE 20 SQ CM/< ICD-10 Diagnosis Description L97.812 Non-pressure chronic ulcer of other part of right lower leg with fat layer expo E11.622 Type 2 diabetes mellitus with other skin ulcer I87.313 Chronic venous  hypertension (idiopathic) with ulcer of bilateral lower extremit Modifier: sed y Quantity: 1 : CPT4 Code: [id number] Description: 17250 - CHEM CAUT GRANULATION TISS ICD-10 Diagnosis Description L97.822 Non-pressure chronic ulcer of other part of left lower leg with fat layer expos E11.622 Type 2 diabetes mellitus with other skin ulcer I87.313 Chronic venous  hypertension (idiopathic) with ulcer of bilateral lower extremit Modifier: ed y Quantity: 1 Physician Procedures : CPT4 Code Description Modifier [id number] 11042 - WC PHYS SUBQ TISS 20 SQ CM ICD-10 Diagnosis Description L97.812 Non-pressure chronic ulcer of other part of right lower leg with fat layer exposed E11.622 Type 2 diabetes mellitus with other skin ulcer  I87.313 Chronic venous hypertension (idiopathic) with ulcer of bilateral lower extremity Quantity: 1 : [id number] 17250  - WC PHYS CHEM CAUT GRAN TISSUE ICD-10 Diagnosis Description L97.822 Non-pressure chronic ulcer of other part of left lower leg with fat layer exposed E11.622 Type 2 diabetes mellitus with other skin ulcer I87.313 Chronic venous  hypertension (idiopathic) with ulcer of bilateral lower extremity Quantity: 1 Electronic Signature(s) Signed: 02/19/2023 1:32:32 PM By: [name] Corwin DO Entered By: [name] on 02/19/2023 12:32:08

## 2023-02-22 NOTE — Progress Notes (Signed)
[name], [name] ([id number]) [id number] Page 1 of 10 Visit Report for 02/19/2023 Arrival Information Details Patient Name: Date of Service: [name] [name]. 02/19/2023 9:30 A M Medical Record Number: [id number] Patient Account Number: [id number] Date of Birth/Sex: Treating RN: 09/04/1970 (52 y.o. M) Primary Care : Barbette Reichmann Other Clinician: Referring : Treating /Extender: Adolph Pollack Weeks in Treatment: 3 Visit Information History Since Last Visit Added or deleted any medications: No Patient Arrived: Ambulatory Any new allergies or adverse reactions: No Arrival Time: 09:36 Had a fall or experienced change in No Accompanied By: self activities of daily living that may affect Transfer Assistance: None risk of falls: Patient Identification Verified: Yes Signs or symptoms of abuse/neglect since last visito No Secondary Verification Process Completed: Yes Hospitalized since last visit: No Patient Requires Transmission-Based Precautions: No Implantable device outside of the clinic excluding No Patient Has Alerts: No cellular tissue based products placed in the center since last visit: Has Dressing in Place as Prescribed: Yes Has Compression in Place as Prescribed: Yes Pain Present Now: No Electronic Signature(s) Signed: 02/22/2023 12:52:02 PM By: Thayer Dallas Entered By: Thayer Dallas on 02/19/2023 09:36:26 -------------------------------------------------------------------------------- Compression Therapy Details Patient Name: Date of Service: [name]. 02/19/2023 9:30 A M Medical Record Number: [id number] Patient Account Number: [id number] Date of Birth/Sex: Treating RN: 1970-09-25 (52 y.o. [name] Primary Care : Barbette Reichmann Other Clinician: Referring : Treating /Extender: Adolph Pollack Weeks in Treatment:  3 Compression Therapy Performed for Wound Assessment: Wound #1 Right,Lateral Lower Leg Performed By: Clinician Redmond Pulling, RN Compression Type: Three Layer Post Procedure Diagnosis Same as Pre-procedure Electronic Signature(s) Signed: 02/19/2023 5:25:55 PM By: Redmond Pulling RN, BSN Entered By: Redmond Pulling on 02/19/2023 27:25:36 [name] ([id number]) [id number] Page 2 of 10 -------------------------------------------------------------------------------- Compression Therapy Details Patient Name: Date of Service: [name] [name]. 02/19/2023 9:30 A M Medical Record Number: [id number] Patient Account Number: [id number] Date of Birth/Sex: Treating RN: 09/05/1970 (52 y.o. [name] Primary Care : Barbette Reichmann Other Clinician: Referring : Treating /Extender: Adolph Pollack Weeks in Treatment: 3 Compression Therapy Performed for Wound Assessment: Wound #2 Left,Posterior Lower Leg Performed By: Clinician Redmond Pulling, RN Compression Type: Three Layer Post Procedure Diagnosis Same as Pre-procedure Electronic Signature(s) Signed: 02/19/2023 5:25:55 PM By: Redmond Pulling RN, BSN Entered By: Redmond Pulling on 02/19/2023 22:02:54 -------------------------------------------------------------------------------- Encounter Discharge Information Details Patient Name: Date of Service: [name]. 02/19/2023 9:30 A M Medical Record Number: [id number] Patient Account Number: [id number] Date of Birth/Sex: Treating RN: Mar 22, 1971 (52 y.o. [name] Primary Care : Barbette Reichmann Other Clinician: Referring : Treating /Extender: Adolph Pollack Weeks in Treatment: 3 Encounter Discharge Information Items Post Procedure Vitals Discharge Condition: Stable Temperature (F): 99.1 Ambulatory Status: Ambulatory Pulse (bpm): 61 Discharge  Destination: Home Respiratory Rate (breaths/min): 18 Transportation: Private Auto Blood Pressure (mmHg): 137/89 Accompanied By: self Schedule Follow-up Appointment: Yes Clinical Summary of Care: Patient Declined Electronic Signature(s) Signed: 02/19/2023 5:25:55 PM By: Redmond Pulling RN, BSN Entered By: Redmond Pulling on 02/19/2023 10:53:51 -------------------------------------------------------------------------------- Lower Extremity Assessment Details Patient Name: Date of Service: [name]. 02/19/2023 9:30 A M Medical Record Number: [id number] Patient Account Number: [id number] Date of Birth/Sex: Treating RN: 01-Nov-1970 (52 y.o. M) Primary Care : Barbette Reichmann Other Clinician: Referring : Treating /Extender: Marcha Dutton, Vishwanath Weeks in Treatment: 3 Edema Assessment Assessed: [Left: No] [Right: No] Edema: [Left: Yes] [Right:  Yes] [name], [name] ([id number]) [id number] Page 3 of 10 Left: Right: Point of Measurement: 34 cm From Medial Instep 34 cm 35 cm Ankle Left: Right: Point of Measurement: 10 cm From Medial Instep 22 cm 21 cm Vascular Assessment Extremity colors, hair growth, and conditions: Extremity Color: [Left:Normal] [Right:Normal] Hair Growth on Extremity: [Left:No] [Right:No] Temperature of Extremity: [Left:Warm] [Right:Warm] Capillary Refill: [Left:< 3 seconds] [Right:< 3 seconds] Dependent Rubor: [Left:No No] [Right:No No] Electronic Signature(s) Signed: 02/22/2023 12:52:02 PM By: Thayer Dallas Entered By: Thayer Dallas on 02/19/2023 09:43:50 -------------------------------------------------------------------------------- Multi Wound Chart Details Patient Name: Date of Service: [name]. 02/19/2023 9:30 A M Medical Record Number: [id number] Patient Account Number: [id number] Date of Birth/Sex: Treating RN: [birth year] (52 y.o. M) Primary Care : Barbette Reichmann Other Clinician: Referring : Treating /Extender: Marcha Dutton, Vishwanath Weeks in Treatment: 3 Vital Signs Height(in): 70 Pulse(bpm): 61 Weight(lbs): 264 Blood Pressure(mmHg): 137/89 Body Mass Index(BMI): 37.9 Temperature(F): 99.1 Respiratory Rate(breaths/min): 18 [1:Photos:] [N/A:N/A] Right, Lateral Lower Leg Left, Posterior Lower Leg N/A Wound Location: Other Lesion Bite N/A Wounding Event: Diabetic Wound/Ulcer of the Lower Diabetic Wound/Ulcer of the Lower N/A Primary Etiology: Extremity Extremity Anemia, Deep Vein Thrombosis, Anemia, Deep Vein Thrombosis, N/A Comorbid History: Hypertension, Peripheral Venous Hypertension, Peripheral Venous Disease, Type II Diabetes, Gout Disease, Type II Diabetes, Gout 01/01/2018 09/14/2022 N/A Date Acquired: 3 3 N/A Weeks of Treatment: Open Open N/A Wound Status: No No N/A Wound Recurrence: 5.1x1.9x0.2 1.4x1.5x0.1 N/A Measurements L x W x D (cm) 7.611 1.649 N/A A (cm) : rea 1.522 0.165 N/A Volume (cm) : 41.80% 50.00% N/A % Reduction in A rea: 70.90% 50.00% N/A % Reduction in Volume: Grade 2 Grade 1 N/A Classification: Medium Medium N/A Exudate A mount: Serosanguineous Serosanguineous N/A Exudate TypeRISHITH, ICAZA ([id number]) [id number] Page 4 of 10 red, brown red, brown N/A Exudate Color: Distinct, outline attached Distinct, outline attached N/A Wound Margin: Large (67-100%) Large (67-100%) N/A Granulation Amount: Red, Pink Red, Hyper-granulation, Friable N/A Granulation Quality: None Present (0%) None Present (0%) N/A Necrotic Amount: Fat Layer (Subcutaneous Tissue): Yes Fat Layer (Subcutaneous Tissue): Yes N/A Exposed Structures: Fascia: No Fascia: No Tendon: No Tendon: No Muscle: No Muscle: No Joint: No Joint: No Bone: No Bone: No Small (1-33%) Small (1-33%) N/A Epithelialization: Debridement - Excisional N/A  N/A Debridement: Pre-procedure Verification/Time Out 10:18 N/A N/A Taken: Lidocaine 5% topical ointment N/A N/A Pain Control: Subcutaneous, Slough N/A N/A Tissue Debrided: Skin/Subcutaneous Tissue N/A N/A Level: 7.61 N/A N/A Debridement A (sq cm): rea Curette N/A N/A Instrument: Minimum N/A N/A Bleeding: Pressure N/A N/A Hemostasis A chieved: Procedure was tolerated well N/A N/A Debridement Treatment Response: 5.1x1.9x0.2 N/A N/A Post Debridement Measurements L x W x D (cm) 1.522 N/A N/A Post Debridement Volume: (cm) Excoriation: No Excoriation: No N/A Periwound Skin Texture: Induration: No Induration: No Callus: No Callus: No Crepitus: No Crepitus: No Rash: No Rash: No Scarring: No Scarring: No Maceration: No Maceration: No N/A Periwound Skin Moisture: Dry/Scaly: No Dry/Scaly: No Hemosiderin Staining: Yes Hemosiderin Staining: Yes N/A Periwound Skin Color: Atrophie Blanche: No Atrophie Blanche: No Cyanosis: No Cyanosis: No Ecchymosis: No Ecchymosis: No Erythema: No Erythema: No Mottled: No Mottled: No Pallor: No Pallor: No Rubor: No Rubor: No No Abnormality No Abnormality N/A Temperature: Debridement Chemical Cauterization N/A Procedures Performed: Treatment Notes Wound #1 (Lower Leg) Wound Laterality: Right, Lateral Cleanser Soap and Water Discharge Instruction: May shower and wash wound with dial antibacterial soap and water prior to dressing change.  Vashe 5.8 (oz) Discharge Instruction: Cleanse the wound with Vashe prior to applying a clean dressing using gauze sponges, not tissue or cotton balls. Peri-Wound Care Sween Lotion (Moisturizing lotion) Discharge Instruction: Apply moisturizing lotion as directed Topical Primary Dressing Promogran Prisma Matrix, 4.34 (sq in) (silver collagen) Discharge Instruction: Moisten collagen with saline or hydrogel Secondary Dressing ABD Pad, 8x10 Discharge Instruction: Apply over primary dressing  as directed. Woven Gauze Sponge, Non-Sterile 4x4 in Discharge Instruction: Apply over primary dressing as directed. Secured With Compression Wrap Urgo K2 Lite, (equivalent to a 3 layer) two layer compression system, regular Discharge Instruction: or Apply Urgo K2 Lite as directed (alternative to 3 layer compression). ThreePress (3 layer compression wrap) Discharge Instruction: Apply three layer compression as directed. Stockinette Compression Stockings [name], [name] ([id number]) [id number] Page 5 of 10 Add-Ons Wound #2 (Lower Leg) Wound Laterality: Left, Posterior Cleanser Soap and Water Discharge Instruction: May shower and wash wound with dial antibacterial soap and water prior to dressing change. Vashe 5.8 (oz) Discharge Instruction: Cleanse the wound with Vashe prior to applying a clean dressing using gauze sponges, not tissue or cotton balls. Peri-Wound Care Sween Lotion (Moisturizing lotion) Discharge Instruction: Apply moisturizing lotion as directed Topical Primary Dressing PolyMem Silver Non-Adhesive Dressing, 4.25x4.25 in Discharge Instruction: Apply to wound bed as instructed Secondary Dressing ABD Pad, 8x10 Discharge Instruction: Apply over primary dressing as directed. Woven Gauze Sponge, Non-Sterile 4x4 in Discharge Instruction: Apply over primary dressing as directed. Secured With Compression Wrap Urgo K2 Lite, (equivalent to a 3 layer) two layer compression system, regular Discharge Instruction: or Apply Urgo K2 Lite as directed (alternative to 3 layer compression). ThreePress (3 layer compression wrap) Discharge Instruction: Apply three layer compression as directed. Stockinette Compression Stockings Facilities manager) Signed: 02/19/2023 1:32:32 PM By: Geralyn Corwin DO Entered By: Geralyn Corwin on 02/19/2023  11:36:20 -------------------------------------------------------------------------------- Multi-Disciplinary Care Plan Details Patient Name: Date of Service: [name]. 02/19/2023 9:30 A M Medical Record Number: [id number] Patient Account Number: [id number] Date of Birth/Sex: Treating RN: 1970-09-02 (52 y.o. [name] Primary Care : Barbette Reichmann Other Clinician: Referring : Treating /Extender: Adolph Pollack Weeks in Treatment: 3 Active Inactive Wound/Skin Impairment Nursing Diagnoses: Impaired tissue integrity Goals: Patient/caregiver will verbalize understanding of skin care regimen Date Initiated: 01/25/2023 Target Resolution Date: 05/16/2023 [name], [name] ([id number]) [id number] Page 6 of 10 Goal Status: Active Interventions: Assess ulceration(s) every visit Treatment Activities: Skin care regimen initiated : 01/25/2023 Notes: Electronic Signature(s) Signed: 02/19/2023 5:25:55 PM By: Redmond Pulling RN, BSN Entered By: Redmond Pulling on 02/19/2023 10:20:53 -------------------------------------------------------------------------------- Pain Assessment Details Patient Name: Date of Service: [name]. 02/19/2023 9:30 A M Medical Record Number: [id number] Patient Account Number: [id number] Date of Birth/Sex: Treating RN: Mar 11, 1971 (52 y.o. M) Primary Care : Barbette Reichmann Other Clinician: Referring : Treating /Extender: Adolph Pollack Weeks in Treatment: 3 Active Problems Location of Pain Severity and Description of Pain Patient Has Paino No Site Locations Pain Management and Medication Current Pain Management: Electronic Signature(s) Signed: 02/22/2023 12:52:02 PM By: Thayer Dallas Entered By: Thayer Dallas on 02/19/2023  09:37:19 -------------------------------------------------------------------------------- Patient/Caregiver Education Details Patient Name: Date of Service: [name] [name] 8/6/2024andnbsp9:30 A M Medical Record Number: [id number] Patient Account Number: [id number] Date of Birth/Gender: Treating RN: 1970-07-20 (52 y.o. Darrious, Macri, [name] ([id number]) [id number] Page 7 of 10 Primary Care Physician: Barbette Reichmann Other Clinician: Referring Physician: Treating Physician/Extender: Marcha Dutton, Vishwanath Weeks in Treatment:  3 Education Assessment Education Provided To: Patient Education Topics Provided Wound/Skin Impairment: Methods: Explain/Verbal Responses: State content correctly Electronic Signature(s) Signed: 02/19/2023 5:25:55 PM By: Redmond Pulling RN, BSN Entered By: Redmond Pulling on 02/19/2023 10:21:30 -------------------------------------------------------------------------------- Wound Assessment Details Patient Name: Date of Service: [name]. 02/19/2023 9:30 A M Medical Record Number: [id number] Patient Account Number: [id number] Date of Birth/Sex: Treating RN: [birth year] (52 y.o. 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 Weeks Of Treatment: 3 Comorbid Anemia, Deep Vein Thrombosis, Hypertension, Peripheral Venous Clustered Wound: No History: Disease, Type II Diabetes, Gout Photos Wound Measurements Length: (cm) 5.1 Width: (cm) 1.9 Depth: (cm) 0.2 Area: (cm) 7.611 Volume: (cm) 1.522 % Reduction in  Area: 41.8% % Reduction in Volume: 70.9% Epithelialization: Small (1-33%) Tunneling: No Undermining: No Wound Description Classification: Grade 2 Wound Margin: Distinct, outline attached Exudate Amount: Medium Exudate Type: Serosanguineous Exudate Color: red, brown [name], [name] ([id number]) Foul Odor After Cleansing: No Slough/Fibrino Yes [id number] Page 8 of 10 Wound Bed Granulation Amount: Large (67-100%) Exposed Structure Granulation Quality: Red, Pink Fascia Exposed: No Necrotic Amount: None Present (0%) Fat Layer (Subcutaneous Tissue) Exposed: Yes Tendon Exposed: No Muscle Exposed: No Joint Exposed: No Bone Exposed: No Periwound Skin Texture Texture Color No Abnormalities Noted: No No Abnormalities Noted: No Callus: No Atrophie Blanche: No Crepitus: No Cyanosis: No Excoriation: No Ecchymosis: No Induration: No Erythema: No Rash: No Hemosiderin Staining: Yes Scarring: No Mottled: No Pallor: No Moisture Rubor: No No Abnormalities Noted: No Dry / Scaly: No Temperature / Pain Maceration: No Temperature: No Abnormality Treatment Notes Wound #1 (Lower Leg) Wound Laterality: Right, Lateral Cleanser Soap and Water Discharge Instruction: May shower and wash wound with dial antibacterial soap and water prior to dressing change. Vashe 5.8 (oz) Discharge Instruction: Cleanse the wound with Vashe prior to applying a clean dressing using gauze sponges, not tissue or cotton balls. Peri-Wound Care Sween Lotion (Moisturizing lotion) Discharge Instruction: Apply moisturizing lotion as directed Topical Primary Dressing Promogran Prisma Matrix, 4.34 (sq in) (silver collagen) Discharge Instruction: Moisten collagen with saline or hydrogel Secondary Dressing ABD Pad, 8x10 Discharge Instruction: Apply over primary dressing as directed. Woven Gauze Sponge, Non-Sterile 4x4 in Discharge Instruction: Apply over primary dressing as  directed. Secured With Compression Wrap Urgo K2 Lite, (equivalent to a 3 layer) two layer compression system, regular Discharge Instruction: or Apply Urgo K2 Lite as directed (alternative to 3 layer compression). ThreePress (3 layer compression wrap) Discharge Instruction: Apply three layer compression as directed. Stockinette Compression Stockings Add-Ons Electronic Signature(s) Signed: 02/22/2023 12:52:02 PM By: Thayer Dallas Entered By: Thayer Dallas on 02/19/2023 09:47:19 [name] ([id number]) [id number] Page 9 of 10 -------------------------------------------------------------------------------- Wound Assessment Details Patient Name: Date of Service: [name] [name]. 02/19/2023 9:30 A M Medical Record Number: [id number] Patient Account Number: [id number] Date of Birth/Sex: Treating RN: Oct 08, 1970 (52 y.o. M) Primary Care : Barbette Reichmann Other Clinician: Referring : Treating /Extender: Marcha Dutton, Vishwanath Weeks in Treatment: 3 Wound Status Wound Number: 2 Primary Diabetic Wound/Ulcer of the Lower Extremity Etiology: Wound Location: Left, Posterior Lower Leg Wound Open Wounding Event: Bite Status: Date Acquired: 09/14/2022 Comorbid Anemia, Deep Vein Thrombosis, Hypertension, Peripheral Venous Weeks  Of Treatment: 3 History: Disease, Type II Diabetes, Gout Clustered Wound: No Photos Wound Measurements Length: (cm) 1. Width: (cm) 1. Depth: (cm) 0. Area: (cm) 1 Volume: (cm) 0 4 % Reduction in Area: 50% 5 % Reduction in Volume: 50% 1 Epithelialization: Small (1-33%) .649 Tunneling: No .165 Undermining: No Wound Description Classification: Grade 1 Wound Margin: Distinct, outline attached Exudate Amount: Medium Exudate Type: Serosanguineous Exudate Color: red, brown Foul Odor After Cleansing: No Slough/Fibrino Yes Wound Bed Granulation Amount: Large (67-100%) Exposed  Structure Granulation Quality: Red, Hyper-granulation, Friable Fascia Exposed: No Necrotic Amount: None Present (0%) Fat Layer (Subcutaneous Tissue) Exposed: Yes Tendon Exposed: No Muscle Exposed: No Joint Exposed: No Bone Exposed: No Periwound Skin Texture Texture Color No Abnormalities Noted: No No Abnormalities Noted: No Callus: No Atrophie Blanche: No Crepitus: No Cyanosis: No Excoriation: No Ecchymosis: No Induration: No Erythema: No Rash: No Hemosiderin Staining: Yes Scarring: No Mottled: No Pallor: No Moisture Rubor: No No Abnormalities Noted: No Dry / Scaly: No Temperature / Pain Maceration: No Temperature: No Abnormality Treatment Notes Wound #2 (Lower Leg) Wound Laterality: Left, Posterior [name], [name] ([id number]) [id number] Page 10 of 10 Soap and Water Discharge Instruction: May shower and wash wound with dial antibacterial soap and water prior to dressing change. Vashe 5.8 (oz) Discharge Instruction: Cleanse the wound with Vashe prior to applying a clean dressing using gauze sponges, not tissue or cotton balls. Peri-Wound Care Sween Lotion (Moisturizing lotion) Discharge Instruction: Apply moisturizing lotion as directed Topical Primary Dressing PolyMem Silver Non-Adhesive Dressing, 4.25x4.25 in Discharge Instruction: Apply to wound bed as instructed Secondary Dressing ABD Pad, 8x10 Discharge Instruction: Apply over primary dressing as directed. Woven Gauze Sponge, Non-Sterile 4x4 in Discharge Instruction: Apply over primary dressing as directed. Secured With Compression Wrap Urgo K2 Lite, (equivalent to a 3 layer) two layer compression system, regular Discharge Instruction: or Apply Urgo K2 Lite as directed (alternative to 3 layer compression). ThreePress (3 layer compression wrap) Discharge Instruction: Apply three layer compression as directed. Stockinette Compression Stockings Geologist, engineering) Signed: 02/22/2023 12:52:02 PM By: Thayer Dallas Entered By: Thayer Dallas on 02/19/2023 09:46:39 -------------------------------------------------------------------------------- Vitals Details Patient Name: Date of Service: [name]. 02/19/2023 9:30 A M Medical Record Number: [id number] Patient Account Number: [id number] Date of Birth/Sex: Treating RN: [birth year] (52 y.o. M) Primary Care : Barbette Reichmann Other Clinician: Referring : Treating /Extender: Marcha Dutton, Vishwanath Weeks in Treatment: 3 Vital Signs Time Taken: 09:36 Temperature (F): 99.1 Height (in): 70 Pulse (bpm): 61 Weight (lbs): 264 Respiratory Rate (breaths/min): 18 Body Mass Index (BMI): 37.9 Blood Pressure (mmHg): 137/89 Reference Range: 80 - 120 mg / dl Electronic Signature(s) Signed: 02/22/2023 12:52:02 PM By: Thayer Dallas Entered By: Thayer Dallas on 02/19/2023 09:37:01

## 2023-02-28 ENCOUNTER — Encounter (HOSPITAL_BASED_OUTPATIENT_CLINIC_OR_DEPARTMENT_OTHER): Payer: Medicaid Other | Admitting: Internal Medicine

## 2023-02-28 DIAGNOSIS — I129 Hypertensive chronic kidney disease with stage 1 through stage 4 chronic kidney disease, or unspecified chronic kidney disease: Secondary | ICD-10-CM | POA: Diagnosis not present

## 2023-02-28 DIAGNOSIS — I87313 Chronic venous hypertension (idiopathic) with ulcer of bilateral lower extremity: Secondary | ICD-10-CM

## 2023-02-28 DIAGNOSIS — Z8249 Family history of ischemic heart disease and other diseases of the circulatory system: Secondary | ICD-10-CM | POA: Diagnosis not present

## 2023-02-28 DIAGNOSIS — E1122 Type 2 diabetes mellitus with diabetic chronic kidney disease: Secondary | ICD-10-CM | POA: Diagnosis not present

## 2023-02-28 DIAGNOSIS — N182 Chronic kidney disease, stage 2 (mild): Secondary | ICD-10-CM | POA: Diagnosis not present

## 2023-02-28 DIAGNOSIS — Z794 Long term (current) use of insulin: Secondary | ICD-10-CM | POA: Diagnosis not present

## 2023-02-28 DIAGNOSIS — L97822 Non-pressure chronic ulcer of other part of left lower leg with fat layer exposed: Secondary | ICD-10-CM

## 2023-02-28 DIAGNOSIS — L97812 Non-pressure chronic ulcer of other part of right lower leg with fat layer exposed: Secondary | ICD-10-CM

## 2023-02-28 DIAGNOSIS — E11622 Type 2 diabetes mellitus with other skin ulcer: Secondary | ICD-10-CM | POA: Diagnosis not present

## 2023-02-28 DIAGNOSIS — Z86718 Personal history of other venous thrombosis and embolism: Secondary | ICD-10-CM | POA: Diagnosis not present

## 2023-03-01 NOTE — Progress Notes (Signed)
[name] ([id number]) [id number] Page 1 of 9 Visit Report for 02/28/2023 Arrival Information Details Patient Name: Date of Service: [name] [name]. 02/28/2023 3:00 PM Medical Record Number: [id number] Patient Account Number: [id number] Date of Birth/Sex: Treating RN: April 04, 1971 (52 y.o. [name] Primary Care Nikolay Demetriou: Barbette Reichmann Other Clinician: Referring Tiearra Colwell: Treating Neeko Pharo/Extender: Adolph Pollack Weeks in Treatment: 4 Visit Information History Since Last Visit Added or deleted any medications: No Patient Arrived: Ambulatory Any new allergies or adverse reactions: No Arrival Time: 15:12 Had a fall or experienced change in No Accompanied By: self activities of daily living that may affect Transfer Assistance: None risk of falls: Patient Identification Verified: Yes Signs or symptoms of abuse/neglect since last visito No Patient Requires Transmission-Based Precautions: No Hospitalized since last visit: No Patient Has Alerts: No Implantable device outside of the clinic excluding No cellular tissue based products placed in the center since last visit: Has Dressing in Place as Prescribed: Yes Has Compression in Place as Prescribed: Yes Pain Present Now: No Electronic Signature(s) Signed: 02/28/2023 5:28:50 PM By: Karie Schwalbe RN Entered By: Karie Schwalbe on 02/28/2023 15:12:39 -------------------------------------------------------------------------------- Compression Therapy Details Patient Name: Date of Service: [name]. 02/28/2023 3:00 PM Medical Record Number: [id number] Patient Account Number: [id number] Date of Birth/Sex: Treating RN: 12/24/1970 (52 y.o. [name] Primary Care Buell Parcel: Barbette Reichmann Other Clinician: Referring Aloura Matsuoka: Treating Endora Teresi/Extender: Adolph Pollack Weeks in Treatment: 4 Compression Therapy Performed for  Wound Assessment: Wound #1 Right,Lateral Lower Leg Performed By: Clinician Karie Schwalbe, RN Compression Type: Three Layer Post Procedure Diagnosis Same as Pre-procedure Electronic Signature(s) Signed: 02/28/2023 5:28:50 PM By: Karie Schwalbe RN Entered By: Karie Schwalbe on 02/28/2023 15:57:08 [name] ([id number]) [id number] Page 2 of 9 -------------------------------------------------------------------------------- Compression Therapy Details Patient Name: Date of Service: [name] [name]. 02/28/2023 3:00 PM Medical Record Number: [id number] Patient Account Number: [id number] Date of Birth/Sex: Treating RN: [birth year] (52 y.o. [name] Primary Care Quantisha Marsicano: Barbette Reichmann Other Clinician: Referring Arabela Basaldua: Treating Harriet Sutphen/Extender: Adolph Pollack Weeks in Treatment: 4 Compression Therapy Performed for Wound Assessment: Wound #2 Left,Posterior Lower Leg Performed By: Clinician Karie Schwalbe, RN Compression Type: Three Layer Post Procedure Diagnosis Same as Pre-procedure Electronic Signature(s) Signed: 02/28/2023 5:28:50 PM By: Karie Schwalbe RN Entered By: Karie Schwalbe on 02/28/2023 15:57:08 -------------------------------------------------------------------------------- Encounter Discharge Information Details Patient Name: Date of Service: [name]. 02/28/2023 3:00 PM Medical Record Number: [id number] Patient Account Number: [id number] Date of Birth/Sex: Treating RN: [birth year] (52 y.o. [name] Primary Care Vannak Montenegro: Barbette Reichmann Other Clinician: Referring Olevia Westervelt: Treating Allayna Erlich/Extender: Adolph Pollack Weeks in Treatment: 4 Encounter Discharge Information Items Post Procedure Vitals Discharge Condition: Stable Temperature (F): 99 Ambulatory Status: Ambulatory Pulse (bpm): 59 Discharge Destination: Home Respiratory Rate  (breaths/min): 16 Transportation: Private Auto Blood Pressure (mmHg): 116/76 Accompanied By: self Schedule Follow-up Appointment: Yes Clinical Summary of Care: Patient Declined Electronic Signature(s) Signed: 02/28/2023 5:28:50 PM By: Karie Schwalbe RN Entered By: Karie Schwalbe on 02/28/2023 16:57:13 -------------------------------------------------------------------------------- Lower Extremity Assessment Details Patient Name: Date of Service: [name]. 02/28/2023 3:00 PM Medical Record Number: [id number] Patient Account Number: [id number] Date of Birth/Sex: Treating RN: 01-04-1971 (52 y.o. [name] Primary Care Demi Trieu: Barbette Reichmann Other Clinician: Referring Trashaun Streight: Treating Leeana Creer/Extender: Marcha Dutton, Vishwanath Weeks in Treatment: 4 Edema Assessment Assessed: [Left: No] [Right: No] Edema: [Left: Yes] [Right: Yes] [name], [name] ([id number]) [id number] Page  3 of 9 Left: Right: Point of Measurement: 34 cm From Medial Instep 34.5 cm 34.8 cm Ankle Left: Right: Point of Measurement: 10 cm From Medial Instep 21.5 cm 21 cm Vascular Assessment Pulses: Dorsalis Pedis Palpable: [Left:Yes] [Right:Yes] Extremity colors, hair growth, and conditions: Extremity Color: [Left:Normal] [Right:Normal] Hair Growth on Extremity: [Left:No] [Right:No] Temperature of Extremity: [Left:Warm] [Right:Warm] Capillary Refill: [Left:< 3 seconds] [Right:< 3 seconds] Dependent Rubor: [Left:No No] [Right:No No] Electronic Signature(s) Signed: 02/28/2023 5:28:50 PM By: Karie Schwalbe RN Entered By: Karie Schwalbe on 02/28/2023 15:31:09 -------------------------------------------------------------------------------- Multi Wound Chart Details Patient Name: Date of Service: [name]. 02/28/2023 3:00 PM Medical Record Number: [id number] Patient Account Number: [id number] Date of Birth/Sex: Treating  RN: [birth year] (52 y.o. M) Primary Care Wlliam Grosso: Barbette Reichmann Other Clinician: Referring Lawson Isabell: Treating Raney Koeppen/Extender: Marcha Dutton, Vishwanath Weeks in Treatment: 4 Vital Signs Height(in): 70 Pulse(bpm): 59 Weight(lbs): 264 Blood Pressure(mmHg): 116/76 Body Mass Index(BMI): 37.9 Temperature(F): 99 Respiratory Rate(breaths/min): 16 [1:Photos:] [N/A:N/A] Right, Lateral Lower Leg Left, Posterior Lower Leg N/A Wound Location: Other Lesion Bite N/A Wounding Event: Diabetic Wound/Ulcer of the Lower Diabetic Wound/Ulcer of the Lower N/A Primary Etiology: Extremity Extremity Anemia, Deep Vein Thrombosis, Anemia, Deep Vein Thrombosis, N/A Comorbid History: Hypertension, Peripheral Venous Hypertension, Peripheral Venous Disease, Type II Diabetes, Gout Disease, Type II Diabetes, Gout 01/01/2018 09/14/2022 N/A Date Acquired: 4 4 N/A Weeks of Treatment: Open Open N/A Wound Status: No No N/A Wound Recurrence: 5x1.9x0.2 1.4x1.4x0.1 N/A Measurements L x W x D (cm) 7.461 1.539 N/A A (cm) : rea 1.492 0.154 N/A Volume (cm) : 42.90% 53.30% N/A % Reduction in Area: 71.50% 53.30% N/A % Reduction in VolumeSHEPHEN, [name] ([id number]) [id number] Page 4 of 9 Grade 2 Grade 1 N/A Classification: Medium Medium N/A Exudate A mount: Serosanguineous Serosanguineous N/A Exudate Type: red, brown red, brown N/A Exudate Color: Distinct, outline attached Distinct, outline attached N/A Wound Margin: Large (67-100%) Large (67-100%) N/A Granulation A mount: Red, Pink Red, Hyper-granulation, Friable N/A Granulation Quality: Small (1-33%) None Present (0%) N/A Necrotic A mount: Eschar N/A N/A Necrotic Tissue: Fat Layer (Subcutaneous Tissue): Yes Fat Layer (Subcutaneous Tissue): Yes N/A Exposed Structures: Fascia: No Fascia: No Tendon: No Tendon: No Muscle: No Muscle: No Joint: No Joint: No Bone: No Bone: No Small (1-33%) Small  (1-33%) N/A Epithelialization: Debridement - Excisional N/A N/A Debridement: Pre-procedure Verification/Time Out 15:30 N/A N/A Taken: Subcutaneous, Slough N/A N/A Tissue Debrided: Skin/Subcutaneous Tissue N/A N/A Level: 7.46 N/A N/A Debridement A (sq cm): rea Curette N/A N/A Instrument: Minimum N/A N/A Bleeding: Pressure N/A N/A Hemostasis A chieved: 0 N/A N/A Procedural Pain: 0 N/A N/A Post Procedural Pain: Procedure was tolerated well N/A N/A Debridement Treatment Response: 5x1.9x0.2 N/A N/A Post Debridement Measurements L x W x D (cm) 1.492 N/A N/A Post Debridement Volume: (cm) Excoriation: No Excoriation: No N/A Periwound Skin Texture: Induration: No Induration: No Callus: No Callus: No Crepitus: No Crepitus: No Rash: No Rash: No Scarring: No Scarring: No Maceration: No Maceration: No N/A Periwound Skin Moisture: Dry/Scaly: No Dry/Scaly: No Hemosiderin Staining: Yes Hemosiderin Staining: Yes N/A Periwound Skin Color: Atrophie Blanche: No Atrophie Blanche: No Cyanosis: No Cyanosis: No Ecchymosis: No Ecchymosis: No Erythema: No Erythema: No Mottled: No Mottled: No Pallor: No Pallor: No Rubor: No Rubor: No No Abnormality No Abnormality N/A Temperature: Cellular or Tissue Based Product Chemical Cauterization N/A Procedures Performed: Compression Therapy Compression Therapy Debridement Treatment Notes Electronic Signature(s) Signed: 02/28/2023 4:25:59 PM By: Geralyn Corwin DO Entered By: Geralyn Corwin  on 02/28/2023 15:59:52 -------------------------------------------------------------------------------- Multi-Disciplinary Care Plan Details Patient Name: Date of Service: [name] [name]. 02/28/2023 3:00 PM Medical Record Number: [id number] Patient Account Number: [id number] Date of Birth/Sex: Treating RN: 1971-05-06 (52 y.o. [name] Primary Care Calaya Gildner: Barbette Reichmann Other Clinician: Referring Jearld Hemp: Treating  Cara Aguino/Extender: Adolph Pollack Weeks in Treatment: 4 Active Inactive Wound/Skin Impairment VERIL, HARKCOM ([id number]) [id number] Page 5 of 9 Nursing Diagnoses: Impaired tissue integrity Goals: Patient/caregiver will verbalize understanding of skin care regimen Date Initiated: 01/25/2023 Target Resolution Date: 05/16/2023 Goal Status: Active Interventions: Assess ulceration(s) every visit Treatment Activities: Skin care regimen initiated : 01/25/2023 Notes: Electronic Signature(s) Signed: 02/28/2023 5:28:50 PM By: Karie Schwalbe RN Entered By: Karie Schwalbe on 02/28/2023 16:54:02 -------------------------------------------------------------------------------- Pain Assessment Details Patient Name: Date of Service: [name]. 02/28/2023 3:00 PM Medical Record Number: [id number] Patient Account Number: [id number] Date of Birth/Sex: Treating RN: Aug 15, 1970 (52 y.o. [name] Primary Care Makalah Asberry: Barbette Reichmann Other Clinician: Referring Ettamae Barkett: Treating Aria Jarrard/Extender: Adolph Pollack Weeks in Treatment: 4 Active Problems Location of Pain Severity and Description of Pain Patient Has Paino No Site Locations Pain Management and Medication Current Pain Management: Electronic Signature(s) Signed: 02/28/2023 5:28:50 PM By: Karie Schwalbe RN Entered By: Karie Schwalbe on 02/28/2023 15:30:39 [name] ([id number]) [id number] Page 6 of 9 -------------------------------------------------------------------------------- Patient/Caregiver Education Details Patient Name: Date of Service: [name] [name] 8/15/2024andnbsp3:00 PM Medical Record Number: [id number] Patient Account Number: [id number] Date of Birth/Gender: Treating RN: 02/21/1971 (52 y.o. [name] Primary Care Physician: Barbette Reichmann Other Clinician: Referring  Physician: Treating Physician/Extender: Adolph Pollack Weeks in Treatment: 4 Education Assessment Education Provided To: Patient Education Topics Provided Wound/Skin Impairment: Methods: Explain/Verbal Responses: State content correctly Electronic Signature(s) Signed: 02/28/2023 5:28:50 PM By: Karie Schwalbe RN Entered By: Karie Schwalbe on 02/28/2023 16:55:07 -------------------------------------------------------------------------------- Wound Assessment Details Patient Name: Date of Service: [name]. 02/28/2023 3:00 PM Medical Record Number: [id number] Patient Account Number: [id number] Date of Birth/Sex: Treating RN: 06/03/1971 (52 y.o. [name] Primary Care Eathel Pajak: Barbette Reichmann Other Clinician: Referring Wang Granada: Treating Agape Hardiman/Extender: Adolph Pollack Weeks in Treatment: 4 Wound Status Wound Number: 1 Primary Diabetic Wound/Ulcer of the Lower Extremity Etiology: Wound Location: Right, Lateral Lower Leg Wound Open Wounding Event: Other Lesion Status: Date Acquired: 01/01/2018 Notes: Pt. 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 Weeks Of Treatment: 4 Comorbid Anemia, Deep Vein Thrombosis, Hypertension, Peripheral Venous Clustered Wound: No History: Disease, Type II Diabetes, Gout Photos Wound Measurements Length: (cm) 5 Width: (cm) 1.9 [name], [name] ([id number]) Depth: (cm) 0.2 Area: (cm) 7.461 Volume: (cm) 1.492 % Reduction in Area: 42.9% % Reduction in Volume: 71.5% [id number] Page 7 of 9 Epithelialization: Small (1-33%) Tunneling: No Undermining: No Wound Description Classification: Grade 2 Wound Margin: Distinct, outline attached Exudate Amount: Medium Exudate Type: Serosanguineous Exudate Color: red, brown Foul Odor After Cleansing: No Slough/Fibrino Yes Wound Bed Granulation Amount: Large (67-100%) Exposed Structure Granulation  Quality: Red, Pink Fascia Exposed: No Necrotic Amount: Small (1-33%) Fat Layer (Subcutaneous Tissue) Exposed: Yes Necrotic Quality: Eschar Tendon Exposed: No Muscle Exposed: No Joint Exposed: No Bone Exposed: No Periwound Skin Texture Texture Color No Abnormalities Noted: No No Abnormalities Noted: No Callus: No Atrophie Blanche: No Crepitus: No Cyanosis: No Excoriation: No Ecchymosis: No Induration: No Erythema: No Rash: No Hemosiderin Staining: Yes Scarring: No Mottled: No Pallor: No Moisture Rubor: No No Abnormalities Noted: No Dry / Scaly:  No Temperature / Pain Maceration: No Temperature: No Abnormality Treatment Notes Wound #1 (Lower Leg) Wound Laterality: Right, Lateral Cleanser Soap and Water Discharge Instruction: May shower and wash wound with dial antibacterial soap and water prior to dressing change. Vashe 5.8 (oz) Discharge Instruction: Cleanse the wound with Vashe prior to applying a clean dressing using gauze sponges, not tissue or cotton balls. Peri-Wound Care Sween Lotion (Moisturizing lotion) Discharge Instruction: Apply moisturizing lotion as directed Topical Primary Dressing Promogran Prisma Matrix, 4.34 (sq in) (silver collagen) Discharge Instruction: Moisten collagen with saline or hydrogel Secondary Dressing Woven Gauze Sponge, Non-Sterile 4x4 in Discharge Instruction: Apply over primary dressing as directed. Secured With Compression Wrap Urgo K2 Lite, (equivalent to a 3 layer) two layer compression system, regular Discharge Instruction: or Apply Urgo K2 Lite as directed (alternative to 3 layer compression). ThreePress (3 layer compression wrap) Discharge Instruction: Apply three layer compression as directed. [place] Compression Stockings Facilities manager) Signed: 02/28/2023 5:28:50 PM By: Karie Schwalbe RN [name], [name] ([id number]) [id number] Page 8 of 9 Entered By: Karie Schwalbe  on 02/28/2023 15:35:55 -------------------------------------------------------------------------------- Wound Assessment Details Patient Name: Date of Service: [name] [name]. 02/28/2023 3:00 PM Medical Record Number: [id number] Patient Account Number: [id number] Date of Birth/Sex: Treating RN: June 14, 1971 (52 y.o. [name] Primary Care Manny Vitolo: Barbette Reichmann Other Clinician: Referring Kiyona Mcnall: Treating Lyle Leisner/Extender: Adolph Pollack Weeks in Treatment: 4 Wound Status Wound Number: 2 Primary Diabetic Wound/Ulcer of the Lower Extremity Etiology: Wound Location: Left, Posterior Lower Leg Wound Open Wounding Event: Bite Status: Date Acquired: 09/14/2022 Comorbid Anemia, Deep Vein Thrombosis, Hypertension, Peripheral Venous Weeks Of Treatment: 4 History: Disease, Type II Diabetes, Gout Clustered Wound: No Photos Wound Measurements Length: (cm) 1.4 Width: (cm) 1.4 Depth: (cm) 0.1 Area: (cm) 1.539 Volume: (cm) 0.154 % Reduction in Area: 53.3% % Reduction in Volume: 53.3% Epithelialization: Small (1-33%) Tunneling: No Undermining: No Wound Description Classification: Grade 1 Wound Margin: Distinct, outline attached Exudate Amount: Medium Exudate Type: Serosanguineous Exudate Color: red, brown Foul Odor After Cleansing: No Slough/Fibrino Yes Wound Bed Granulation Amount: Large (67-100%) Exposed Structure Granulation Quality: Red, Hyper-granulation, Friable Fascia Exposed: No Necrotic Amount: None Present (0%) Fat Layer (Subcutaneous Tissue) Exposed: Yes Tendon Exposed: No Muscle Exposed: No Joint Exposed: No Bone Exposed: No Periwound Skin Texture Texture Color No Abnormalities Noted: No No Abnormalities Noted: No Callus: No Atrophie Blanche: No Crepitus: No Cyanosis: No Excoriation: No Ecchymosis: No Induration: No Erythema: No Rash: No Hemosiderin Staining: Yes Scarring: No Mottled: No Pallor:  No Moisture Rubor: No [name], [name] ([id number]) [id number] Page 9 of 9 Rubor: No No Abnormalities Noted: No Dry / Scaly: No Temperature / Pain Maceration: No Temperature: No Abnormality Treatment Notes Wound #2 (Lower Leg) Wound Laterality: Left, Posterior Cleanser Soap and Water Discharge Instruction: May shower and wash wound with dial antibacterial soap and water prior to dressing change. Vashe 5.8 (oz) Discharge Instruction: Cleanse the wound with Vashe prior to applying a clean dressing using gauze sponges, not tissue or cotton balls. Peri-Wound Care Sween Lotion (Moisturizing lotion) Discharge Instruction: Apply moisturizing lotion as directed Topical Primary Dressing Apligraf Discharge Instruction: with strei strips/Adaptic or Mepitel,gauze,kling Secondary Dressing Woven Gauze Sponge, Non-Sterile 4x4 in Discharge Instruction: Apply over primary dressing as directed. Secured With Compression Wrap Urgo K2 Lite, (equivalent to a 3 layer) two layer compression system, regular Discharge Instruction: or Apply Urgo K2 Lite as directed (alternative to 3 layer compression). ThreePress (3 layer compression wrap) Discharge Instruction: Apply three layer  compression as directed. [employer] Compression Stockings Facilities manager) Signed: 02/28/2023 5:28:50 PM By: Karie Schwalbe RN Entered By: Karie Schwalbe on 02/28/2023 15:36:44 -------------------------------------------------------------------------------- Vitals Details Patient Name: Date of Service: [name]. 02/28/2023 3:00 PM Medical Record Number: [id number] Patient Account Number: [id number] Date of Birth/Sex: Treating RN: [birth year] (52 y.o. [name] Primary Care Cristhian Vanhook: Barbette Reichmann Other Clinician: Referring Zi Sek: Treating Dalynn Jhaveri/Extender: Adolph Pollack Weeks in Treatment: 4 Vital Signs Time Taken:  15:25 Temperature (F): 99 Height (in): 70 Pulse (bpm): 59 Weight (lbs): 264 Respiratory Rate (breaths/min): 16 Body Mass Index (BMI): 37.9 Blood Pressure (mmHg): 116/76 Reference Range: 80 - 120 mg / dl Electronic Signature(s) Signed: 02/28/2023 5:28:50 PM By: Karie Schwalbe RN Entered By: Karie Schwalbe on 02/28/2023 15:30:20

## 2023-03-01 NOTE — Progress Notes (Signed)
[name] ([id number]) [id number] Page 1 of 11 Visit Report for 02/28/2023 Chief Complaint Document Details Patient Name: Date of Service: [name] 02/28/2023 3:00 PM Medical Record Number: [id number] Patient Account Number: [id number] Date of Birth/Sex: Treating RN: Mar 19, 1971 (52 y.o. M) Primary Care Provider: Barbette Reichmann Other Clinician: Referring Provider: Treating Provider/Extender: Adolph Pollack Weeks in Treatment: 4 Information Obtained from: Patient Chief Complaint 01/25/2023; bilateral lower extremity wounds Electronic Signature(s) Signed: 02/28/2023 4:25:59 PM By: Geralyn Corwin DO Entered By: Geralyn Corwin on 02/28/2023 16:00:03 -------------------------------------------------------------------------------- Cellular or Tissue Based Product Details Patient Name: Date of Service: [name] 02/28/2023 3:00 PM Medical Record Number: [id number] Patient Account Number: [id number] Date of Birth/Sex: Treating RN: May 26, 1971 (52 y.o. [name] Primary Care Provider: Barbette Reichmann Other Clinician: Referring Provider: Treating Provider/Extender: Adolph Pollack Weeks in Treatment: 4 Cellular or Tissue Based Product Type Wound #1 Right,Lateral Lower Leg Applied to: Performed By: Physician Geralyn Corwin, DO Cellular or Tissue Based Product Type: Apligraf Level of Consciousness (Pre-procedure): Awake and Alert Pre-procedure Verification/Time Out Yes - 15:30 Taken: Location: trunk / arms / legs Wound Size (sq cm): 9.5 Product Size (sq cm): 44 Waste Size (sq cm): 22 Waste Reason: size of wound Amount of Product Applied (sq cm): 22 Instrument Used: Blade, Forceps, Scissors Lot #: [id number] Order #: 1 Expiration Date: 03/07/2023 Fenestrated: Yes Instrument: Blade Reconstituted: Yes Solution Type: Normal saline Solution Amount: 5 ml Lot #:  [id number] Solution Expiration Date: 12/13/2024 Secured: Yes Secured With: Steri-Strips Dressing Applied: Yes Primary Dressing: Adaptic,gauze,kling Procedural Pain: 0 Post Procedural PainDONZEL, Price ([id number]) [id number] Page 2 of 11 Response to Treatment: Procedure was tolerated well Level of Consciousness (Post- Awake and Alert procedure): Post Procedure Diagnosis Same as Pre-procedure Notes Scribed for Dr. Mikey Bussing by J.Scotton Electronic Signature(s) Signed: 02/28/2023 4:25:59 PM By: Geralyn Corwin DO Signed: 02/28/2023 5:28:50 PM By: Karie Schwalbe RN Entered By: Karie Schwalbe on 02/28/2023 15:56:46 -------------------------------------------------------------------------------- Debridement Details Patient Name: Date of Service: [name] 02/28/2023 3:00 PM Medical Record Number: [id number] Patient Account Number: [id number] Date of Birth/Sex: Treating RN: 07-Jun-1971 (52 y.o. [name] Primary Care Provider: Barbette Reichmann Other Clinician: Referring Provider: Treating Provider/Extender: Adolph Pollack Weeks in Treatment: 4 Debridement Performed for Assessment: Wound #1 Right,Lateral Lower Leg Performed By: Physician Geralyn Corwin, DO Debridement Type: Debridement Severity of Tissue Pre Debridement: Fat layer exposed Level of Consciousness (Pre-procedure): Awake and Alert Pre-procedure Verification/Time Out Yes - 15:30 Taken: Start Time: 15:30 Percent of Wound Bed Debrided: 100% T Area Debrided (cm): otal 7.46 Tissue and other material debrided: Viable, Non-Viable, Slough, Subcutaneous, Slough Level: Skin/Subcutaneous Tissue Debridement Description: Excisional Instrument: Curette Bleeding: Minimum Hemostasis Achieved: Pressure End Time: 15:31 Procedural Pain: 0 Post Procedural Pain: 0 Response to Treatment: Procedure was tolerated well Level of Consciousness (Post- Awake and  Alert procedure): Post Debridement Measurements of Total Wound Length: (cm) 5 Width: (cm) 1.9 Depth: (cm) 0.2 Volume: (cm) 1.492 Character of Wound/Ulcer Post Debridement: Improved Severity of Tissue Post Debridement: Fat layer exposed Post Procedure Diagnosis Same as Pre-procedure Notes Scribed for Dr. Mikey Bussing by J.Scotton Electronic Signature(s) Signed: 02/28/2023 4:25:59 PM By: Geralyn Corwin DO Signed: 02/28/2023 5:28:50 PM By: Karie Schwalbe RN Entered By: Karie Schwalbe on 02/28/2023 15:52:53 [name] ([id number]) [id number] Page 3 of 11 -------------------------------------------------------------------------------- HPI Details Patient Name: Date of Service: [name] 02/28/2023 3:00 PM Medical Record Number: [id number] Patient Account  Number: [id number] Date of Birth/Sex: Treating RN: 1970/08/29 (52 y.o. M) Primary Care Provider: Barbette Reichmann Other Clinician: Referring Provider: Treating Provider/Extender: Adolph Pollack Weeks in Treatment: 4 History of Present Illness HPI Description: 01/25/2023 [name] is a 52 year old male with a past medical history of controlled type 2 diabetes on insulin, venous insufficiency and DVT that presents to the clinic for bilateral lower extremity wounds. He states that the wound on the left was started by a bug bite and has been present for the past 4 months. The right lower extremity wound he states started out as a soft tissue infection and has been present for the past 6 years. He has been treated by a wound care center in [facility] in [place]. He was last seen in the wound care center 2 years ago Could not continue following up due to lack of transportation. He states that recently the rehab center has been wrapping his legs with compression wrap and a wound dressing underneath. It is unclear when the last time this happened  was. He is not wearing any compression today. He currently denies signs of infection. 7/19; patient presents for follow-up. We have been using antibiotic ointment with Hydrofera Blue under 3 layer compression to the lower extremities bilaterally. Wounds are smaller. 7/30; patient presents for follow-up. We have been using antibiotic ointment and Hydrofera Blue under 3 layer compression to the lower extremities bilaterally. Wounds are smaller. 8/6; patient presents for follow-up. We have been using antibiotic ointment with Hydrofera Blue under 3 layer compression to the lower extremities bilaterally. Wounds are stable. He denies signs of infection. 8/15; patient presents for follow-up. We have been using collagen to the left lower extremity and PolyMem silver to the right lower extremity all under 3 layer compression. Wounds are slightly smaller today. Patient has been approved for Apligraf and he was agreeable to having this placed in office. Electronic Signature(s) Signed: 02/28/2023 4:25:59 PM By: Geralyn Corwin DO Entered By: Geralyn Corwin on 02/28/2023 16:01:02 -------------------------------------------------------------------------------- Chemical Cauterization Details Patient Name: Date of Service: [name] 02/28/2023 3:00 PM Medical Record Number: [id number] Patient Account Number: [id number] Date of Birth/Sex: Treating RN: 1970/07/20 (52 y.o. [name] Primary Care Provider: Barbette Reichmann Other Clinician: Referring Provider: Treating Provider/Extender: Adolph Pollack Weeks in Treatment: 4 Procedure Performed for: Wound #2 Left,Posterior Lower Leg Performed By: Physician Geralyn Corwin, DO Post Procedure Diagnosis Same as Pre-procedure Electronic Signature(s) Signed: 02/28/2023 4:25:59 PM By: Geralyn Corwin DO Signed: 02/28/2023 5:28:50 PM By: Karie Schwalbe RN Entered By: Karie Schwalbe on 02/28/2023 15:53:21 [name] ([id number]) [id number] Page 4 of 11 -------------------------------------------------------------------------------- Physical Exam Details Patient Name: Date of Service: [name] 02/28/2023 3:00 PM Medical Record Number: [id number] Patient Account Number: [id number] Date of Birth/Sex: Treating RN: [birth year] (52 y.o. M) Primary Care Provider: Barbette Reichmann Other Clinician: Referring Provider: Treating Provider/Extender: Marcha Dutton, Vishwanath Weeks in Treatment: 4 Constitutional respirations regular, non-labored and within target range for patient.. Cardiovascular 2+ dorsalis pedis/posterior tibialis pulses. Psychiatric pleasant and cooperative. Notes Right lower extremity: T the posterior aspect there is an open wound with granulation tissue and nonviable tissue. Good edema control . No surrounding signs o of infection including increased warmth, erythema or purulent drainage. Left lower extremity: T the posterior aspect there is an open wound with granulation o tissue and areas of hyper granulated tissue. No signs of surrounding soft tissue infection including increased warmth, erythema or  purulent drainage. Electronic Signature(s) Signed: 02/28/2023 4:25:59 PM By: Geralyn Corwin DO Entered By: Geralyn Corwin on 02/28/2023 16:03:35 -------------------------------------------------------------------------------- Physician Orders Details Patient Name: Date of Service: [name] 02/28/2023 3:00 PM Medical Record Number: [id number] Patient Account Number: [id number] Date of Birth/Sex: Treating RN: 1971/01/21 (52 y.o. [name] Primary Care Provider: Barbette Reichmann Other Clinician: Referring Provider: Treating Provider/Extender: Adolph Pollack Weeks in Treatment: 4 Verbal / Phone Orders: No Diagnosis Coding Follow-up Appointments ppointment in 1 week. - Dr. Mikey Bussing  Room 9 03/05/23 Thursday @ 9:30am Return A ppointment in 2 weeks. - Dr Mikey Bussing Rm 7or 9 Please ask front desk to schedule an appopintment Return A Return appointment in 3 weeks. - Dr Mikey Bussing Rm 7or 9 Please ask front desk to schedule an appopintment Return appointment in 1 month. - Dr Mikey Bussing Rm 7or 9 Please ask front desk to schedule an appopintment Anesthetic (In clinic) Topical Lidocaine 4% applied to wound bed Cellular or Tissue Based Products Wound #1 Right,Lateral Lower Leg Cellular or Tissue Based Product Type: - Apligraf #1 applied 02/28/23 Other Cellular or Tissue Based Products Orders/Instructions: - Already done- Run insurance verification for skin substitute Bathing/ Shower/ Hygiene May shower with protection but do not get wound dressing(s) wet. Protect dressing(s) with water repellant cover (for example, large plastic bag) or a cast cover and may then take shower. - Please do not get the legs wet. Please use cast protectors when showering. Cast protectors can be purchased from [employer], Medical supply store. Cost ranges from $17-$37 Edema Control - Lymphedema / SCD / Other Bilateral Lower Extremities Lymphedema Pumps. Use Lymphedema pumps on leg(s) 2-3 times a day for 45-60 minutes. If wearing any wraps or hose, do not remove them. Continue exercising as instructed. - Keep using the Lymphadema Pumps.You may place the th Lyphadema pumps over the compression [name], [name] ([id number]) [id number] Page 5 of 11 wraps. Elevate legs to the level of the heart or above for 30 minutes daily and/or when sitting for 3-4 times a day throughout the day. Avoid standing for long periods of time. Exercise regularly - As tolerated Off-Loading Other: - Keep legs elevated to heart level or above when sitting. You may place a pillow behind the calf for comfort, if so desired. Wound Treatment Wound #1 - Lower Leg Wound Laterality: Right,  Lateral Cleanser: Soap and Water 1 x Per Week/30 Days Discharge Instructions: May shower and wash wound with dial antibacterial soap and water prior to dressing change. Cleanser: Vashe 5.8 (oz) 1 x Per Week/30 Days Discharge Instructions: Cleanse the wound with Vashe prior to applying a clean dressing using gauze sponges, not tissue or cotton balls. Peri-Wound Care: Sween Lotion (Moisturizing lotion) 1 x Per Week/30 Days Discharge Instructions: Apply moisturizing lotion as directed Prim Dressing: Promogran Prisma Matrix, 4.34 (sq in) (silver collagen) 1 x Per Week/30 Days ary Discharge Instructions: Moisten collagen with saline or hydrogel Secondary Dressing: Woven Gauze Sponge, Non-Sterile 4x4 in 1 x Per Week/30 Days Discharge Instructions: Apply over primary dressing as directed. Compression Wrap: Urgo K2 Lite, (equivalent to a 3 layer) two layer compression system, regular 1 x Per Week/30 Days Discharge Instructions: or Apply Urgo K2 Lite as directed (alternative to 3 layer compression). Compression Wrap: ThreePress (3 layer compression wrap) 1 x Per Week/30 Days Discharge Instructions: Apply three layer compression as directed. Compression Wrap: Stockinette 1 x Per Week/30 Days Wound #2 - Lower Leg Wound Laterality: Left, Posterior Cleanser: Soap and Water 1  x Per Week/30 Days Discharge Instructions: May shower and wash wound with dial antibacterial soap and water prior to dressing change. Cleanser: Vashe 5.8 (oz) 1 x Per Week/30 Days Discharge Instructions: Cleanse the wound with Vashe prior to applying a clean dressing using gauze sponges, not tissue or cotton balls. Peri-Wound Care: Sween Lotion (Moisturizing lotion) 1 x Per Week/30 Days Discharge Instructions: Apply moisturizing lotion as directed Prim Dressing: Apligraf 1 x Per Week/30 Days ary Discharge Instructions: with strei strips/Adaptic or Mepitel,gauze,kling Secondary Dressing: Woven Gauze Sponge, Non-Sterile 4x4 in 1 x  Per Week/30 Days Discharge Instructions: Apply over primary dressing as directed. Compression Wrap: Urgo K2 Lite, (equivalent to a 3 layer) two layer compression system, regular 1 x Per Week/30 Days Discharge Instructions: or Apply Urgo K2 Lite as directed (alternative to 3 layer compression). Compression Wrap: ThreePress (3 layer compression wrap) 1 x Per Week/30 Days Discharge Instructions: Apply three layer compression as directed. Compression Wrap: Stockinette 1 x Per Week/30 Days Electronic Signature(s) Signed: 02/28/2023 4:25:59 PM By: Geralyn Corwin DO Entered By: Geralyn Corwin on 02/28/2023 16:08:59 -------------------------------------------------------------------------------- Problem List Details Patient Name: Date of Service: [name] 02/28/2023 3:00 PM Medical Record Number: [id number] Patient Account Number: [id number] Date of Birth/Sex: Treating RN: 08-Aug-1970 (52 y.o. M) Primary Care Provider: Barbette Reichmann Other Clinician: DAMARIA, [name] ([id number]) [id number] Page 6 of 11 Referring Provider: Treating Provider/Extender: Adolph Pollack Weeks in Treatment: 4 Active Problems ICD-10 Encounter Code Description Active Date MDM Diagnosis L97.812 Non-pressure chronic ulcer of other part of right lower leg with fat layer 01/25/2023 No Yes exposed L97.822 Non-pressure chronic ulcer of other part of left lower leg with fat layer exposed7/06/2023 No Yes E11.622 Type 2 diabetes mellitus with other skin ulcer 01/25/2023 No Yes I87.313 Chronic venous hypertension (idiopathic) with ulcer of bilateral lower extremity 01/25/2023 No Yes Inactive Problems Resolved Problems Electronic Signature(s) Signed: 02/28/2023 4:25:59 PM By: Geralyn Corwin DO Entered By: Geralyn Corwin on 02/28/2023 15:59:43 -------------------------------------------------------------------------------- Progress Note Details Patient  Name: Date of Service: [name] 02/28/2023 3:00 PM Medical Record Number: [id number] Patient Account Number: [id number] Date of Birth/Sex: Treating RN: [birth year] (52 y.o. M) Primary Care Provider: Barbette Reichmann Other Clinician: Referring Provider: Treating Provider/Extender: Adolph Pollack Weeks in Treatment: 4 Subjective Chief Complaint Information obtained from Patient 01/25/2023; bilateral lower extremity wounds History of Present Illness (HPI) 01/25/2023 Mr. Sergey Craigen is a 52 year old male with a past medical history of controlled type 2 diabetes on insulin, venous insufficiency and DVT that presents to the clinic for bilateral lower extremity wounds. He states that the wound on the left was started by a bug bite and has been present for the past 4 months. The right lower extremity wound he states started out as a soft tissue infection and has been present for the past 6 years. He has been treated by a wound care center in [facility] in [place]. He was last seen in the wound care center 2 years ago Could not continue following up due to lack of transportation. He states that recently the rehab center has been wrapping his legs with compression wrap and a wound dressing underneath. It is unclear when the last time this happened was. He is not wearing any compression today. He currently denies signs of infection. 7/19; patient presents for follow-up. We have been using antibiotic ointment with Hydrofera Blue under 3 layer compression to the lower extremities bilaterally. Wounds are smaller. 7/30; patient  presents for follow-up. We have been using antibiotic ointment and Hydrofera Blue under 3 layer compression to the lower extremities bilaterally. Wounds are smaller. 8/6; patient presents for follow-up. We have been using antibiotic ointment with Hydrofera Blue under 3 layer compression to the lower extremities  bilaterally. Wounds are stable. He denies signs of infection. 8/15; patient presents for follow-up. We have been using collagen to the left lower extremity and PolyMem silver to the right lower extremity all under 3 layer compression. Wounds are slightly smaller today. Patient has been approved for Apligraf and he was agreeable to having this placed in office. [name], [name] ([id number]) [id number] Page 7 of 11 Patient History Information obtained from Patient. Family History Heart Disease, Hypertension - Mother,Siblings, Kidney Disease - Father,Siblings, Stroke - Mother, No family history of Diabetes. Social History Never smoker, Marital Status - Single, Alcohol Use - Never, Drug Use - No History, Caffeine Use - Moderate - soda,coffee. Medical History Hematologic/Lymphatic Patient has history of Anemia Cardiovascular Patient has history of Deep Vein Thrombosis - both leg, Hypertension, Peripheral Venous Disease Endocrine Patient has history of Type II Diabetes - HgbA1c 6.2 (5/24) Musculoskeletal Patient has history of Gout Hospitalization/Surgery History - scrotal surgery. - right elbow surgery. - right elbow septic arthritis IandD x2 with skin graft. - hernia repair 2022. - 2018 Ecoli sepsis. - necrotizing fasciitis. Medical A Surgical History Notes nd Constitutional Symptoms (General Health) CVA Cardiovascular cellulitis right leg 2018 Genitourinary CKD stage 2 Objective Constitutional respirations regular, non-labored and within target range for patient.. Vitals Time Taken: 3:25 PM, Height: 70 in, Weight: 264 lbs, BMI: 37.9, Temperature: 99 F, Pulse: 59 bpm, Respiratory Rate: 16 breaths/min, Blood Pressure: 116/76 mmHg. Cardiovascular 2+ dorsalis pedis/posterior tibialis pulses. Psychiatric pleasant and cooperative. General Notes: Right lower extremity: T the posterior aspect there is an open wound with granulation tissue and nonviable  tissue. Good edema control . No o surrounding signs of infection including increased warmth, erythema or purulent drainage. Left lower extremity: T the posterior aspect there is an open wound o with granulation tissue and areas of hyper granulated tissue. No signs of surrounding soft tissue infection including increased warmth, erythema or purulent drainage. Integumentary (Hair, Skin) Wound #1 status is Open. Original cause of wound was Other Lesion. The date acquired was: 01/01/2018. The wound has been in treatment 4 weeks. The wound is located on the Right,Lateral Lower Leg. The wound measures 5cm length x 1.9cm width x 0.2cm depth; 7.461cm^2 area and 1.492cm^3 volume. There is Fat Layer (Subcutaneous Tissue) exposed. There is no tunneling or undermining noted. There is a medium amount of serosanguineous drainage noted. The wound margin is distinct with the outline attached to the wound base. There is large (67-100%) red, pink granulation within the wound bed. There is a small (1-33%) amount of necrotic tissue within the wound bed including Eschar. The periwound skin appearance exhibited: Hemosiderin Staining. The periwound skin appearance did not exhibit: Callus, Crepitus, Excoriation, Induration, Rash, Scarring, Dry/Scaly, Maceration, Atrophie Blanche, Cyanosis, Ecchymosis, Mottled, Pallor, Rubor, Erythema. Periwound temperature was noted as No Abnormality. Wound #2 status is Open. Original cause of wound was Bite. The date acquired was: 09/14/2022. The wound has been in treatment 4 weeks. The wound is located on the Left,Posterior Lower Leg. The wound measures 1.4cm length x 1.4cm width x 0.1cm depth; 1.539cm^2 area and 0.154cm^3 volume. There is Fat Layer (Subcutaneous Tissue) exposed. There is no tunneling or undermining noted. There is a medium amount of serosanguineous drainage noted. The  wound margin is distinct with the outline attached to the wound base. There is large (67-100%) red,  friable, hyper - granulation within the wound bed. There is no necrotic tissue within the wound bed. The periwound skin appearance exhibited: Hemosiderin Staining. The periwound skin appearance did not exhibit: Callus, Crepitus, Excoriation, Induration, Rash, Scarring, Dry/Scaly, Maceration, Atrophie Blanche, Cyanosis, Ecchymosis, Mottled, Pallor, Rubor, Erythema. Periwound temperature was noted as No Abnormality. Assessment Active Problems ICD-10 Non-pressure chronic ulcer of other part of right lower leg with fat layer exposed [name], [name] ([id number]) [id number] Page 8 of 11 Non-pressure chronic ulcer of other part of left lower leg with fat layer exposed Type 2 diabetes mellitus with other skin ulcer Chronic venous hypertension (idiopathic) with ulcer of bilateral lower extremity Patient's wounds have shown improvement in size in appearance since last clinic visit. I used silver nitrate to the left lower extremity wound and recommended continuing collagen under 3 layer compression here. T the right lower extremity I debrided nonviable tissue and Apligraf was placed in standard o fashion. Continue 3 layer compression here. Follow-up in 1 week. Procedures Wound #1 Pre-procedure diagnosis of Wound #1 is a Diabetic Wound/Ulcer of the Lower Extremity located on the Right,Lateral Lower Leg .Severity of Tissue Pre Debridement is: Fat layer exposed. There was a Excisional Skin/Subcutaneous Tissue Debridement with a total area of 7.46 sq cm performed by Geralyn Corwin, DO. With the following instrument(s): Curette to remove Viable and Non-Viable tissue/material. Material removed includes Subcutaneous Tissue and Slough and. No specimens were taken. A time out was conducted at 15:30, prior to the start of the procedure. A Minimum amount of bleeding was controlled with Pressure. The procedure was tolerated well with a pain level of 0 throughout and a pain level of 0  following the procedure. Post Debridement Measurements: 5cm length x 1.9cm width x 0.2cm depth; 1.492cm^3 volume. Character of Wound/Ulcer Post Debridement is improved. Severity of Tissue Post Debridement is: Fat layer exposed. Post procedure Diagnosis Wound #1: Same as Pre-Procedure General Notes: Scribed for Dr. Mikey Bussing by J.Scotton. Pre-procedure diagnosis of Wound #1 is a Diabetic Wound/Ulcer of the Lower Extremity located on the Right,Lateral Lower Leg. A skin graft procedure using a bioengineered skin substitute/cellular or tissue based product was performed by Geralyn Corwin, DO with the following instrument(s): Blade, Forceps, and Scissors. Apligraf was applied and secured with Steri-Strips. 22 sq cm of product was utilized and 22 sq cm was wasted due to size of wound. Post Application, Adaptic,gauze,kling was applied. A Time Out was conducted at 15:30, prior to the start of the procedure. The procedure was tolerated well with a pain level of 0 throughout and a pain level of 0 following the procedure. Post procedure Diagnosis Wound #1: Same as Pre-Procedure General Notes: Scribed for Dr. Mikey Bussing by J.Scotton. Pre-procedure diagnosis of Wound #1 is a Diabetic Wound/Ulcer of the Lower Extremity located on the Right,Lateral Lower Leg . There was a Three Layer Compression Therapy Procedure by Karie Schwalbe, RN. Post procedure Diagnosis Wound #1: Same as Pre-Procedure Wound #2 Pre-procedure diagnosis of Wound #2 is a Diabetic Wound/Ulcer of the Lower Extremity located on the Left,Posterior Lower Leg . There was a Three Layer Compression Therapy Procedure by Karie Schwalbe, RN. Post procedure Diagnosis Wound #2: Same as Pre-Procedure Pre-procedure diagnosis of Wound #2 is a Diabetic Wound/Ulcer of the Lower Extremity located on the Left,Posterior Lower Leg . An Chemical Cauterization procedure was performed by Geralyn Corwin, DO. Post procedure Diagnosis Wound #2: Same as  Pre-Procedure Plan Follow-up Appointments: Return Appointment in 1 week. - Dr. Mikey Bussing Room 9 03/05/23 Thursday @ 9:30am Return Appointment in 2 weeks. - Dr Mikey Bussing Rm 7or 9 Please ask front desk to schedule an appopintment Return appointment in 3 weeks. - Dr Mikey Bussing Rm 7or 9 Please ask front desk to schedule an appopintment Return appointment in 1 month. - Dr Mikey Bussing Rm 7or 9 Please ask front desk to schedule an appopintment Anesthetic: (In clinic) Topical Lidocaine 4% applied to wound bed Cellular or Tissue Based Products: Wound #1 Right,Lateral Lower Leg: Cellular or Tissue Based Product Type: - Apligraf #1 applied 02/28/23 Other Cellular or Tissue Based Products Orders/Instructions: - Already done- Run insurance verification for skin substitute Bathing/ Shower/ Hygiene: May shower with protection but do not get wound dressing(s) wet. Protect dressing(s) with water repellant cover (for example, large plastic bag) or a cast cover and may then take shower. - Please do not get the legs wet. Please use cast protectors when showering. Cast protectors can be purchased from [employer], Medical supply store. Cost ranges from $17-$37 Edema Control - Lymphedema / SCD / Other: Lymphedema Pumps. Use Lymphedema pumps on leg(s) 2-3 times a day for 45-60 minutes. If wearing any wraps or hose, do not remove them. Continue exercising as instructed. - Keep using the Lymphadema Pumps.You may place the th Lyphadema pumps over the compression wraps. Elevate legs to the level of the heart or above for 30 minutes daily and/or when sitting for 3-4 times a day throughout the day. Avoid standing for long periods of time. Exercise regularly - As tolerated Off-Loading: Other: - Keep legs elevated to heart level or above when sitting. You may place a pillow behind the calf for comfort, if so desired. WOUND #1: - Lower Leg Wound Laterality: Right, Lateral Cleanser: Soap and Water 1 x Per Week/30  Days Discharge Instructions: May shower and wash wound with dial antibacterial soap and water prior to dressing change. Cleanser: Vashe 5.8 (oz) 1 x Per Week/30 Days Discharge Instructions: Cleanse the wound with Vashe prior to applying a clean dressing using gauze sponges, not tissue or cotton balls. Peri-Wound Care: Sween Lotion (Moisturizing lotion) 1 x Per Week/30 Days Discharge Instructions: Apply moisturizing lotion as directed Prim Dressing: Promogran Prisma Matrix, 4.34 (sq in) (silver collagen) 1 x Per Week/30 Days ary Discharge Instructions: Moisten collagen with saline or hydrogel Secondary Dressing: Woven Gauze Sponge, Non-Sterile 4x4 in 1 x Per Week/30 Days [name], [name] ([id number]) [id number] Page 9 of 11 Discharge Instructions: Apply over primary dressing as directed. Com pression Wrap: Urgo K2 Lite, (equivalent to a 3 layer) two layer compression system, regular 1 x Per Week/30 Days Discharge Instructions: or Apply Urgo K2 Lite as directed (alternative to 3 layer compression). Com pression Wrap: ThreePress (3 layer compression wrap) 1 x Per Week/30 Days Discharge Instructions: Apply three layer compression as directed. Com pression Wrap: Stockinette 1 x Per Week/30 Days WOUND #2: - Lower Leg Wound Laterality: Left, Posterior Cleanser: Soap and Water 1 x Per Week/30 Days Discharge Instructions: May shower and wash wound with dial antibacterial soap and water prior to dressing change. Cleanser: Vashe 5.8 (oz) 1 x Per Week/30 Days Discharge Instructions: Cleanse the wound with Vashe prior to applying a clean dressing using gauze sponges, not tissue or cotton balls. Peri-Wound Care: Sween Lotion (Moisturizing lotion) 1 x Per Week/30 Days Discharge Instructions: Apply moisturizing lotion as directed Prim Dressing: Apligraf 1 x Per Week/30 Days ary Discharge Instructions: with strei strips/Adaptic or  Mepitel,gauze,kling Secondary Dressing: Woven  Gauze Sponge, Non-Sterile 4x4 in 1 x Per Week/30 Days Discharge Instructions: Apply over primary dressing as directed. Com pression Wrap: Urgo K2 Lite, (equivalent to a 3 layer) two layer compression system, regular 1 x Per Week/30 Days Discharge Instructions: or Apply Urgo K2 Lite as directed (alternative to 3 layer compression). Com pression Wrap: ThreePress (3 layer compression wrap) 1 x Per Week/30 Days Discharge Instructions: Apply three layer compression as directed. Com pression Wrap: Stockinette 1 x Per Week/30 Days 1. In office sharp debridement 2. Apligraf placed in standard fashion 3. Collagen 4. Silver nitrate 5. 3 layer compression to lower extremities bilaterally 6. Follow-up in 1 week Electronic Signature(s) Signed: 02/28/2023 4:25:59 PM By: Geralyn Corwin DO Entered By: Geralyn Corwin on 02/28/2023 16:09:54 -------------------------------------------------------------------------------- HxROS Details Patient Name: Date of Service: [name] 02/28/2023 3:00 PM Medical Record Number: [id number] Patient Account Number: [id number] Date of Birth/Sex: Treating RN: [birth year] (52 y.o. M) Primary Care Provider: Barbette Reichmann Other Clinician: Referring Provider: Treating Provider/Extender: Adolph Pollack Weeks in Treatment: 4 Information Obtained From Patient Constitutional Symptoms (General Health) Medical History: Past Medical History Notes: CVA Hematologic/Lymphatic Medical History: Positive for: Anemia Cardiovascular Medical History: Positive for: Deep Vein Thrombosis - both leg; Hypertension; Peripheral Venous Disease Past Medical History Notes: cellulitis right leg 2018 Endocrine Medical History: Positive for: Type II Diabetes - HgbA1c 6.2 (5/24) Time with diabetes: 9 years Treated with: Insulin [name], [name] ([id number]) [id number] Page 10 of 11 Blood sugar tested every day:  Yes Tested : 3 x day Blood sugar testing results: Breakfast: 97 Genitourinary Medical History: Past Medical History Notes: CKD stage 2 Musculoskeletal Medical History: Positive for: Gout Immunizations Pneumococcal Vaccine: Received Pneumococcal Vaccination: No Implantable Devices None Hospitalization / Surgery History Type of Hospitalization/Surgery scrotal surgery right elbow surgery right elbow septic arthritis IandD x2 with skin graft hernia repair 2022 2018 Ecoli sepsis necrotizing fasciitis Family and Social History Diabetes: No; Heart Disease: Yes; Hypertension: Yes - Mother,Siblings; Kidney Disease: Yes - Father,Siblings; Stroke: Yes - Mother; Never smoker; Marital Status - Single; Alcohol Use: Never; Drug Use: No History; Caffeine Use: Moderate - soda,coffee; Financial Concerns: No; Food, Clothing or Shelter Needs: No; Support System Lacking: No; Transportation Concerns: No Electronic Signature(s) Signed: 02/28/2023 4:25:59 PM By: Geralyn Corwin DO Entered By: Geralyn Corwin on 02/28/2023 16:01:07 -------------------------------------------------------------------------------- SuperBill Details Patient Name: Date of Service: [name] 02/28/2023 Medical Record Number: [id number] Patient Account Number: [id number] Date of Birth/Sex: Treating RN: [birth year] (52 y.o. M) Primary Care Provider: Barbette Reichmann Other Clinician: Referring Provider: Treating Provider/Extender: Adolph Pollack Weeks in Treatment: 4 Diagnosis Coding ICD-10 Codes Code Description [phone] Non-pressure chronic ulcer of other part of right lower leg with fat layer exposed L97.822 Non-pressure chronic ulcer of other part of left lower leg with fat layer exposed E11.622 Type 2 diabetes mellitus with other skin ulcer I87.313 Chronic venous hypertension (idiopathic) with ulcer of bilateral lower extremity Facility Procedures : CPT4 Code:  [id number] Description: (Facility Use Only) Apligraf 1 SQ CM Modifier: Quantity: 44 : [facility] CPT4 Code: [id number] [name] Darner Price ([id number] L97 E11 I87 Description: 15271 - SKIN SUB GRAFT TRNK/ARM/LEG ICD-10 Diagnosis Description 0) [id number] Non-pressure chronic ulcer of other part of right lower leg with fat layer expose .622 Type 2 diabetes mellitus with other skin ulcer .313 Chronic  venous hypertension (idiopathic) with ulcer of bilateral lower extremity Modifier: [id number] d Quantity: 1 7.pdf Page 11 of 11 :  CPT4 Code: [id number] ICD- L97 E11 I87 Description: 0 - CHEM CAUT GRANULATION TISS 10 Diagnosis Description .822 Non-pressure chronic ulcer of other part of left lower leg with fat layer exposed .622 Type 2 diabetes mellitus with other skin ulcer .313 Chronic venous hypertension (idiopathic)  with ulcer of bilateral lower extremity Modifier: Quantity: 1 Physician Procedures : CPT4 Code Description Modifier [id number] 15271 - WC PHYS SKIN SUB GRAFT TRNK/ARM/LEG ICD-10 Diagnosis Description L97.812 Non-pressure chronic ulcer of other part of right lower leg with fat layer exposed E11.622 Type 2 diabetes mellitus with other skin  ulcer I87.313 Chronic venous hypertension (idiopathic) with ulcer of bilateral lower extremity Quantity: 1 : [id number] 17250 - WC PHYS CHEM CAUT GRAN TISSUE ICD-10 Diagnosis Description L97.822 Non-pressure chronic ulcer of other part of left lower leg with fat layer exposed E11.622 Type 2 diabetes mellitus with other skin ulcer I87.313 Chronic venous  hypertension (idiopathic) with ulcer of bilateral lower extremity Quantity: 1 Electronic Signature(s) Signed: 02/28/2023 4:25:59 PM By: Geralyn Corwin DO Entered By: Geralyn Corwin on 02/28/2023 16:10:42

## 2023-03-05 ENCOUNTER — Encounter (HOSPITAL_BASED_OUTPATIENT_CLINIC_OR_DEPARTMENT_OTHER): Payer: Medicaid Other | Admitting: Internal Medicine

## 2023-03-05 DIAGNOSIS — E11622 Type 2 diabetes mellitus with other skin ulcer: Secondary | ICD-10-CM

## 2023-03-05 DIAGNOSIS — I129 Hypertensive chronic kidney disease with stage 1 through stage 4 chronic kidney disease, or unspecified chronic kidney disease: Secondary | ICD-10-CM | POA: Diagnosis not present

## 2023-03-05 DIAGNOSIS — L97812 Non-pressure chronic ulcer of other part of right lower leg with fat layer exposed: Secondary | ICD-10-CM | POA: Diagnosis not present

## 2023-03-05 DIAGNOSIS — Z794 Long term (current) use of insulin: Secondary | ICD-10-CM | POA: Diagnosis not present

## 2023-03-05 DIAGNOSIS — N182 Chronic kidney disease, stage 2 (mild): Secondary | ICD-10-CM | POA: Diagnosis not present

## 2023-03-05 DIAGNOSIS — L97822 Non-pressure chronic ulcer of other part of left lower leg with fat layer exposed: Secondary | ICD-10-CM | POA: Diagnosis not present

## 2023-03-05 DIAGNOSIS — I87313 Chronic venous hypertension (idiopathic) with ulcer of bilateral lower extremity: Secondary | ICD-10-CM

## 2023-03-05 DIAGNOSIS — Z8249 Family history of ischemic heart disease and other diseases of the circulatory system: Secondary | ICD-10-CM | POA: Diagnosis not present

## 2023-03-05 DIAGNOSIS — Z86718 Personal history of other venous thrombosis and embolism: Secondary | ICD-10-CM | POA: Diagnosis not present

## 2023-03-05 DIAGNOSIS — E1122 Type 2 diabetes mellitus with diabetic chronic kidney disease: Secondary | ICD-10-CM | POA: Diagnosis not present

## 2023-03-06 NOTE — Progress Notes (Signed)
[name] ([id number]) [id number] Page 1 of 11 Visit Report for 03/05/2023 Chief Complaint Document Details Patient Name: Date of Service: [name] 03/05/2023 9:30 A M Medical Record Number: [id number] Patient Account Number: [id number] Date of Birth/Sex: Treating RN: [birth year] (52 y.o. M) Primary Care Provider: Barbette Reichmann Other Clinician: Referring Provider: Treating Provider/Extender: Adolph Pollack Weeks in Treatment: 5 Information Obtained from: Patient Chief Complaint 01/25/2023; bilateral lower extremity wounds Electronic Signature(s) Signed: 03/05/2023 2:47:41 PM By: Geralyn Corwin DO Entered By: Geralyn Corwin on 03/05/2023 08:29:14 -------------------------------------------------------------------------------- Cellular or Tissue Based Product Details Patient Name: Date of Service: [name] 03/05/2023 9:30 A M Medical Record Number: [id number] Patient Account Number: [id number] Date of Birth/Sex: Treating RN: [birth year] (52 y.o. [name] Primary Care Provider: Barbette Reichmann Other Clinician: Referring Provider: Treating Provider/Extender: Adolph Pollack Weeks in Treatment: 5 Cellular or Tissue Based Product Type Wound #1 Right,Lateral Lower Leg Applied to: Performed By: Physician Geralyn Corwin, DO Cellular or Tissue Based Product Type: Apligraf Level of Consciousness (Pre-procedure): Awake and Alert Pre-procedure Verification/Time Out Yes - 10:18 Taken: Location: trunk / arms / legs Wound Size (sq cm): 7.84 Product Size (sq cm): 44 Waste Size (sq cm): 0 Waste Reason: too big Amount of Product Applied (sq cm): 44 Instrument Used: Blade, Forceps, Scissors Lot #: [id number] Order #: 1 Expiration Date: 03/13/2027 Fenestrated: No Reconstituted: Yes Solution Type: NS Solution Amount: 3 ml Lot #: [id number] Solution Expiration Date:  12/13/2024 Secured: No Dressing Applied: Yes Primary Dressing: mepitel Procedural Pain: 0 Post Procedural Pain: 0 Response to Treatment: Procedure was tolerated well Level of Consciousness (Post- Awake and Alert [name], [name] ([id number]) [id number] Page 2 of 11 procedure): Post Procedure Diagnosis Same as Pre-procedure Notes Scribed for Dr Mikey Bussing by Brenton Grills RN. Electronic Signature(s) Signed: 03/05/2023 2:47:41 PM By: Geralyn Corwin DO Signed: 03/05/2023 5:13:11 PM By: Shawn Stall RN, BSN Entered By: Shawn Stall on 03/05/2023 08:05:49 -------------------------------------------------------------------------------- Debridement Details Patient Name: Date of Service: [name] 03/05/2023 9:30 A M Medical Record Number: [id number] Patient Account Number: [id number] Date of Birth/Sex: Treating RN: 14-Sep-1970 (52 y.o. [name] Primary Care Provider: Barbette Reichmann Other Clinician: Referring Provider: Treating Provider/Extender: Adolph Pollack Weeks in Treatment: 5 Debridement Performed for Assessment: Wound #1 Right,Lateral Lower Leg Performed By: Physician Geralyn Corwin, DO Debridement Type: Debridement Severity of Tissue Pre Debridement: Fat layer exposed Level of Consciousness (Pre-procedure): Awake and Alert Pre-procedure Verification/Time Out Yes - 11:00 Taken: Start Time: 11:05 Pain Control: Lidocaine 4% T opical Solution Percent of Wound Bed Debrided: 100% T Area Debrided (cm): otal 6.15 Tissue and other material debrided: Viable, Slough, Subcutaneous, Slough Level: Skin/Subcutaneous Tissue Debridement Description: Excisional Instrument: Curette Bleeding: Minimum Hemostasis Achieved: Pressure End Time: 11:10 Procedural Pain: 0 Post Procedural Pain: 0 Response to Treatment: Procedure was tolerated well Level of Consciousness (Post- Awake and Alert procedure): Post Debridement  Measurements of Total Wound Length: (cm) 4.9 Width: (cm) 1.6 Depth: (cm) 0.2 Volume: (cm) 1.232 Character of Wound/Ulcer Post Debridement: Improved Severity of Tissue Post Debridement: Fat layer exposed Post Procedure Diagnosis Same as Pre-procedure Notes Scribed for Dr Mikey Bussing by Brenton Grills RN. Electronic Signature(s) Signed: 03/05/2023 2:47:41 PM By: Geralyn Corwin DO Signed: 03/05/2023 5:13:11 PM By: Shawn Stall RN, BSN Entered By: Shawn Stall on 03/05/2023 08:01:29 [name] ([id number]) [id number] Page 3 of 11 -------------------------------------------------------------------------------- Debridement Details Patient Name: Date of Service: [name] 03/05/2023  9:30 A M Medical Record Number: [id number] Patient Account Number: [id number] Date of Birth/Sex: Treating RN: 1971-07-01 (52 y.o. [name] Primary Care Provider: Barbette Reichmann Other Clinician: Referring Provider: Treating Provider/Extender: Adolph Pollack Weeks in Treatment: 5 Debridement Performed for Assessment: Wound #2 Left,Posterior Lower Leg Performed By: Physician Geralyn Corwin, DO Debridement Type: Debridement Severity of Tissue Pre Debridement: Fat layer exposed Level of Consciousness (Pre-procedure): Awake and Alert Pre-procedure Verification/Time Out Yes - 11:05 Taken: Start Time: 11:10 Pain Control: Lidocaine 4% T opical Solution Percent of Wound Bed Debrided: 100% T Area Debrided (cm): otal 1.54 Tissue and other material debrided: Viable, Skin: Dermis , Skin: Epidermis Level: Skin/Epidermis Debridement Description: Selective/Open Wound Instrument: Curette Bleeding: Minimum Hemostasis Achieved: Pressure End Time: 11:15 Procedural Pain: 0 Post Procedural Pain: 0 Response to Treatment: Procedure was tolerated well Level of Consciousness (Post- Awake and Alert procedure): Post Debridement Measurements of  Total Wound Length: (cm) 1.4 Width: (cm) 1.4 Depth: (cm) 0.1 Volume: (cm) 0.154 Character of Wound/Ulcer Post Debridement: Improved Severity of Tissue Post Debridement: Fat layer exposed Post Procedure Diagnosis Same as Pre-procedure Notes Scribed for Dr Mikey Bussing by Brenton Grills RN. Electronic Signature(s) Signed: 03/05/2023 2:47:41 PM By: Geralyn Corwin DO Signed: 03/05/2023 5:13:11 PM By: Shawn Stall RN, BSN Entered By: Shawn Stall on 03/05/2023 08:02:22 -------------------------------------------------------------------------------- HPI Details Patient Name: Date of Service: [name] 03/05/2023 9:30 A M Medical Record Number: [id number] Patient Account Number: [id number] Date of Birth/Sex: Treating RN: 25-Feb-1971 (52 y.o. M) Primary Care Provider: Barbette Reichmann Other Clinician: Referring Provider: Treating Provider/Extender: Marcha Dutton, Vishwanath Weeks in Treatment: 5 JHASE, ROTHWELL K ([id number]) [id number] Page 4 of 11 History of Present Illness HPI Description: 01/25/2023 [name] is a 52 year old male with a past medical history of controlled type 2 diabetes on insulin, venous insufficiency and DVT that presents to the clinic for bilateral lower extremity wounds. He states that the wound on the left was started by a bug bite and has been present for the past 4 months. The right lower extremity wound he states started out as a soft tissue infection and has been present for the past 6 years. He has been treated by a wound care center in [facility] in [place]. He was last seen in the wound care center 2 years ago Could not continue following up due to lack of transportation. He states that recently the rehab center has been wrapping his legs with compression wrap and a wound dressing underneath. It is unclear when the last time this happened was. He is not wearing any compression  today. He currently denies signs of infection. 7/19; patient presents for follow-up. We have been using antibiotic ointment with Hydrofera Blue under 3 layer compression to the lower extremities bilaterally. Wounds are smaller. 7/30; patient presents for follow-up. We have been using antibiotic ointment and Hydrofera Blue under 3 layer compression to the lower extremities bilaterally. Wounds are smaller. 8/6; patient presents for follow-up. We have been using antibiotic ointment with Hydrofera Blue under 3 layer compression to the lower extremities bilaterally. Wounds are stable. He denies signs of infection. 8/15; patient presents for follow-up. We have been using collagen to the left lower extremity and PolyMem silver to the right lower extremity all under 3 layer compression. Wounds are slightly smaller today. Patient has been approved for Apligraf and he was agreeable to having this placed in office. 8/20; patient presents for follow-up. At last clinic visit we used Apligraf  to the right lower extremity under 3 layer compression and collagen to the left lower extremity under 3 layer compression. Wounds are smaller. Electronic Signature(s) Signed: 03/05/2023 2:47:41 PM By: Geralyn Corwin DO Entered By: Geralyn Corwin on 03/05/2023 08:33:50 -------------------------------------------------------------------------------- Physical Exam Details Patient Name: Date of Service: [name] 03/05/2023 9:30 A M Medical Record Number: [id number] Patient Account Number: [id number] Date of Birth/Sex: Treating RN: Sep 24, 1970 (52 y.o. M) Primary Care Provider: Barbette Reichmann Other Clinician: Referring Provider: Treating Provider/Extender: Marcha Dutton, Vishwanath Weeks in Treatment: 5 Constitutional respirations regular, non-labored and within target range for patient.. Cardiovascular 2+ dorsalis pedis/posterior tibialis pulses. Psychiatric pleasant and  cooperative. Notes Right lower extremity: T the posterior aspect there is an open wound with granulation tissue and nonviable tissue. Good edema control . No surrounding signs o of infection including increased warmth, erythema or purulent drainage. Left lower extremity: T the posterior aspect there is an open wound with devitalized o tissue. No signs of surrounding soft tissue infection including increased warmth, erythema or purulent drainage. Electronic Signature(s) Signed: 03/05/2023 2:47:41 PM By: Geralyn Corwin DO Entered By: Geralyn Corwin on 03/05/2023 08:39:04 -------------------------------------------------------------------------------- Physician Orders Details Patient Name: Date of Service: [name] 03/05/2023 9:30 A M Medical Record Number: [id number] Patient Account Number: [id number] Date of Birth/Sex: Treating RN: 07-Aug-1970 (52 y.o. Samiir, Loehrer, Westyn K ([id number]) [phone].pdf Page 5 of 11 Primary Care Provider: Barbette Reichmann Other Clinician: Referring Provider: Treating Provider/Extender: Adolph Pollack Weeks in Treatment: 5 Verbal / Phone Orders: No Diagnosis Coding Follow-up Appointments ppointment in 1 week. - Dr. Mikey Bussing Room 9 Return A ppointment in 2 weeks. - Dr Mikey Bussing Rm 7or 9 Please ask front desk to schedule an appopintment Return A Return appointment in 3 weeks. - Dr Mikey Bussing Rm 7or 9 Please ask front desk to schedule an appopintment Return appointment in 1 month. - Dr Mikey Bussing Rm 7or 9 Please ask front desk to schedule an appopintment Anesthetic (In clinic) Topical Lidocaine 4% applied to wound bed Cellular or Tissue Based Products Wound #1 Right,Lateral Lower Leg Cellular or Tissue Based Product Type: - Apligraf #1 applied 02/28/23 Apligraf #2 applied 03/05/2023 Other Cellular or Tissue Based Products Orders/Instructions: - Already done- Run insurance verification for  skin substitute Bathing/ Shower/ Hygiene May shower with protection but do not get wound dressing(s) wet. Protect dressing(s) with water repellant cover (for example, large plastic bag) or a cast cover and may then take shower. - Please do not get the legs wet. Please use cast protectors when showering. Cast protectors can be purchased from [employer], Medical supply store. Cost ranges from $17-$37 Edema Control - Lymphedema / SCD / Other Bilateral Lower Extremities Lymphedema Pumps. Use Lymphedema pumps on leg(s) 2-3 times a day for 45-60 minutes. If wearing any wraps or hose, do not remove them. Continue exercising as instructed. - Keep using the Lymphadema Pumps.You may place the th Lyphadema pumps over the compression wraps. Elevate legs to the level of the heart or above for 30 minutes daily and/or when sitting for 3-4 times a day throughout the day. Avoid standing for long periods of time. Exercise regularly - As tolerated Off-Loading Other: - Keep legs elevated to heart level or above when sitting. You may place a pillow behind the calf for comfort, if so desired. Wound Treatment Wound #1 - Lower Leg Wound Laterality: Right, Lateral Cleanser: Soap and Water 1 x Per Week/30 Days Discharge Instructions: May shower and wash wound  with dial antibacterial soap and water prior to dressing change. Cleanser: Vashe 5.8 (oz) 1 x Per Week/30 Days Discharge Instructions: Cleanse the wound with Vashe prior to applying a clean dressing using gauze sponges, not tissue or cotton balls. Peri-Wound Care: Sween Lotion (Moisturizing lotion) 1 x Per Week/30 Days Discharge Instructions: Apply moisturizing lotion as directed Secondary Dressing: Woven Gauze Sponge, Non-Sterile 4x4 in 1 x Per Week/30 Days Discharge Instructions: Apply over primary dressing as directed. Compression Wrap: Urgo K2 Lite, (equivalent to a 3 layer) two layer compression system, regular 1 x Per Week/30  Days Discharge Instructions: Apply Urgo K2 Lite as directed (alternative to 3 layer compression). Compression Wrap: Stockinette 1 x Per Week/30 Days Wound #2 - Lower Leg Wound Laterality: Left, Posterior Cleanser: Soap and Water 1 x Per Week/30 Days Discharge Instructions: May shower and wash wound with dial antibacterial soap and water prior to dressing change. Cleanser: Vashe 5.8 (oz) 1 x Per Week/30 Days Discharge Instructions: Cleanse the wound with Vashe prior to applying a clean dressing using gauze sponges, not tissue or cotton balls. Peri-Wound Care: Sween Lotion (Moisturizing lotion) 1 x Per Week/30 Days Discharge Instructions: Apply moisturizing lotion as directed Prim Dressing: Promogran Prisma Matrix, 4.34 (sq in) (silver collagen) 1 x Per Week/30 Days ary Discharge Instructions: Moisten collagen with saline or hydrogel Secondary Dressing: Woven Gauze Sponge, Non-Sterile 4x4 in 1 x Per Week/30 Days Discharge Instructions: Apply over primary dressing as directed. Compression Wrap: Urgo K2 Lite, (equivalent to a 3 layer) two layer compression system, regular 1 x Per Week/30 Days Discharge Instructions: Apply Urgo K2 Lite as directed (alternative to 3 layer compression). Compression Wrap: Stockinette 1 x Per Week/30 Days [name], [name] ([id number]) [id number] Page 6 of 11 Electronic Signature(s) Signed: 03/05/2023 2:47:41 PM By: Geralyn Corwin DO Entered By: Geralyn Corwin on 03/05/2023 08:39:45 -------------------------------------------------------------------------------- Problem List Details Patient Name: Date of Service: [name] 03/05/2023 9:30 A M Medical Record Number: [id number] Patient Account Number: [id number] Date of Birth/Sex: Treating RN: October 10, 1970 (52 y.o. Harlon Flor, Millard.Loa Primary Care Provider: Barbette Reichmann Other Clinician: Referring Provider: Treating Provider/Extender: Adolph Pollack Weeks in Treatment: 5 Active Problems ICD-10 Encounter Code Description Active Date MDM Diagnosis L97.812 Non-pressure chronic ulcer of other part of right lower leg with fat layer 01/25/2023 No Yes exposed L97.822 Non-pressure chronic ulcer of other part of left lower leg with fat layer exposed7/06/2023 No Yes E11.622 Type 2 diabetes mellitus with other skin ulcer 01/25/2023 No Yes I87.313 Chronic venous hypertension (idiopathic) with ulcer of bilateral lower extremity 01/25/2023 No Yes Inactive Problems Resolved Problems Electronic Signature(s) Signed: 03/05/2023 2:47:41 PM By: Geralyn Corwin DO Entered By: Geralyn Corwin on 03/05/2023 08:27:54 -------------------------------------------------------------------------------- Progress Note Details Patient Name: Date of Service: [name] 03/05/2023 9:30 A M Medical Record Number: [id number] Patient Account Number: [id number] Date of Birth/Sex: Treating RN: 04-Feb-1971 (52 y.o. M) Primary Care Provider: Barbette Reichmann Other Clinician: Referring Provider: Treating Provider/Extender: Marcha Dutton, Vishwanath Weeks in Treatment: [address] KENDRICH, CAMPER K ([id number]) [id number] Page 7 of 11 Chief Complaint Information obtained from Patient 01/25/2023; bilateral lower extremity wounds History of Present Illness (HPI) 01/25/2023 [name] is a 52 year old male with a past medical history of controlled type 2 diabetes on insulin, venous insufficiency and DVT that presents to the clinic for bilateral lower extremity wounds. He states that the wound on the left was started by a bug bite and has been present for the past  4 months. The right lower extremity wound he states started out as a soft tissue infection and has been present for the past 6 years. He has been treated by a wound care center in [facility] in [place]. He was last seen in  the wound care center 2 years ago Could not continue following up due to lack of transportation. He states that recently the rehab center has been wrapping his legs with compression wrap and a wound dressing underneath. It is unclear when the last time this happened was. He is not wearing any compression today. He currently denies signs of infection. 7/19; patient presents for follow-up. We have been using antibiotic ointment with Hydrofera Blue under 3 layer compression to the lower extremities bilaterally. Wounds are smaller. 7/30; patient presents for follow-up. We have been using antibiotic ointment and Hydrofera Blue under 3 layer compression to the lower extremities bilaterally. Wounds are smaller. 8/6; patient presents for follow-up. We have been using antibiotic ointment with Hydrofera Blue under 3 layer compression to the lower extremities bilaterally. Wounds are stable. He denies signs of infection. 8/15; patient presents for follow-up. We have been using collagen to the left lower extremity and PolyMem silver to the right lower extremity all under 3 layer compression. Wounds are slightly smaller today. Patient has been approved for Apligraf and he was agreeable to having this placed in office. 8/20; patient presents for follow-up. At last clinic visit we used Apligraf to the right lower extremity under 3 layer compression and collagen to the left lower extremity under 3 layer compression. Wounds are smaller. Patient History Information obtained from Patient. Family History Heart Disease, Hypertension - Mother,Siblings, Kidney Disease - Father,Siblings, Stroke - Mother, No family history of Diabetes. Social History Never smoker, Marital Status - Single, Alcohol Use - Never, Drug Use - No History, Caffeine Use - Moderate - soda,coffee. Medical History Hematologic/Lymphatic Patient has history of Anemia Cardiovascular Patient has history of Deep Vein Thrombosis - both leg,  Hypertension, Peripheral Venous Disease Endocrine Patient has history of Type II Diabetes - HgbA1c 6.2 (5/24) Musculoskeletal Patient has history of Gout Hospitalization/Surgery History - scrotal surgery. - right elbow surgery. - right elbow septic arthritis IandD x2 with skin graft. - hernia repair 2022. - 2018 Ecoli sepsis. - necrotizing fasciitis. Medical A Surgical History Notes nd Constitutional Symptoms (General Health) CVA Cardiovascular cellulitis right leg 2018 Genitourinary CKD stage 2 Objective Constitutional respirations regular, non-labored and within target range for patient.. Vitals Time Taken: 9:50 AM, Height: 70 in, Weight: 264 lbs, BMI: 37.9, Temperature: 98.6 F, Pulse: 64 bpm, Respiratory Rate: 18 breaths/min, Blood Pressure: 131/79 mmHg. Cardiovascular 2+ dorsalis pedis/posterior tibialis pulses. Psychiatric pleasant and cooperative. General Notes: Right lower extremity: T the posterior aspect there is an open wound with granulation tissue and nonviable tissue. Good edema control . No o surrounding signs of infection including increased warmth, erythema or purulent drainage. Left lower extremity: T the posterior aspect there is an open wound o with devitalized tissue. No signs of surrounding soft tissue infection including increased warmth, erythema or purulent drainage. [name], [name] ([id number]) [id number] Page 8 of 11 Integumentary (Hair, Skin) Wound #1 status is Open. Original cause of wound was Other Lesion. The date acquired was: 01/01/2018. The wound has been in treatment 5 weeks. The wound is located on the Right,Lateral Lower Leg. The wound measures 4.9cm length x 1.6cm width x 0.2cm depth; 6.158cm^2 area and 1.232cm^3 volume. There is Fat Layer (Subcutaneous Tissue) exposed. There is  no tunneling or undermining noted. There is a medium amount of serosanguineous drainage noted. The wound margin is thickened. There is  large (67-100%) red, pink granulation within the wound bed. There is a small (1-33%) amount of necrotic tissue within the wound bed including Eschar. The periwound skin appearance exhibited: Hemosiderin Staining. The periwound skin appearance did not exhibit: Callus, Crepitus, Excoriation, Induration, Rash, Scarring, Dry/Scaly, Maceration, Atrophie Blanche, Cyanosis, Ecchymosis, Mottled, Pallor, Rubor, Erythema. Periwound temperature was noted as No Abnormality. Wound #2 status is Open. Original cause of wound was Bite. The date acquired was: 09/14/2022. The wound has been in treatment 5 weeks. The wound is located on the Left,Posterior Lower Leg. The wound measures 1.4cm length x 1.4cm width x 0.1cm depth; 1.539cm^2 area and 0.154cm^3 volume. There is no tunneling or undermining noted. There is a none present amount of drainage noted. The wound margin is distinct with the outline attached to the wound base. There is no granulation within the wound bed. There is a large (67-100%) amount of necrotic tissue within the wound bed including Eschar. The periwound skin appearance exhibited: Hemosiderin Staining. The periwound skin appearance did not exhibit: Callus, Crepitus, Excoriation, Induration, Rash, Scarring, Dry/Scaly, Maceration, Atrophie Blanche, Cyanosis, Ecchymosis, Mottled, Pallor, Rubor, Erythema. Periwound temperature was noted as No Abnormality. Assessment Active Problems ICD-10 Non-pressure chronic ulcer of other part of right lower leg with fat layer exposed Non-pressure chronic ulcer of other part of left lower leg with fat layer exposed Type 2 diabetes mellitus with other skin ulcer Chronic venous hypertension (idiopathic) with ulcer of bilateral lower extremity Patient's wounds appear well-healing. I debrided nonviable tissue. I recommended continuing the course with collagen to the left lower extremity under 3 layer compression. Apligraf was placed in standard fashion to the right  lower extremity also under 3 layer compression. Follow-up in 1 week. Procedures Wound #1 Pre-procedure diagnosis of Wound #1 is a Diabetic Wound/Ulcer of the Lower Extremity located on the Right,Lateral Lower Leg .Severity of Tissue Pre Debridement is: Fat layer exposed. There was a Excisional Skin/Subcutaneous Tissue Debridement with a total area of 6.15 sq cm performed by Geralyn Corwin, DO. With the following instrument(s): Curette to remove Viable tissue/material. Material removed includes Subcutaneous Tissue and Slough and after achieving pain control using Lidocaine 4% Topical Solution. No specimens were taken. A time out was conducted at 11:00, prior to the start of the procedure. A Minimum amount of bleeding was controlled with Pressure. The procedure was tolerated well with a pain level of 0 throughout and a pain level of 0 following the procedure. Post Debridement Measurements: 4.9cm length x 1.6cm width x 0.2cm depth; 1.232cm^3 volume. Character of Wound/Ulcer Post Debridement is improved. Severity of Tissue Post Debridement is: Fat layer exposed. Post procedure Diagnosis Wound #1: Same as Pre-Procedure General Notes: Scribed for Dr Mikey Bussing by Brenton Grills RN.. Pre-procedure diagnosis of Wound #1 is a Diabetic Wound/Ulcer of the Lower Extremity located on the Right,Lateral Lower Leg. A skin graft procedure using a bioengineered skin substitute/cellular or tissue based product was performed by Geralyn Corwin, DO with the following instrument(s): Blade, Forceps, and Scissors. Apligraf was applied and was not secured. 44 sq cm of product was utilized and 0 sq cm was wasted due to too big. Post Application, mepitel was applied. A Time Out was conducted at 10:18, prior to the start of the procedure. The procedure was tolerated well with a pain level of 0 throughout and a pain level of 0 following the procedure. Post procedure  Diagnosis Wound #1: Same as Pre-Procedure General Notes:  Scribed for Dr Mikey Bussing by Brenton Grills RN. Pre-procedure diagnosis of Wound #1 is a Diabetic Wound/Ulcer of the Lower Extremity located on the Right,Lateral Lower Leg . There was a Three Layer Compression Therapy Procedure by Brenton Grills, RN. Post procedure Diagnosis Wound #1: Same as Pre-Procedure Wound #2 Pre-procedure diagnosis of Wound #2 is a Diabetic Wound/Ulcer of the Lower Extremity located on the Left,Posterior Lower Leg .Severity of Tissue Pre Debridement is: Fat layer exposed. There was a Selective/Open Wound Skin/Epidermis Debridement with a total area of 1.54 sq cm performed by Geralyn Corwin, DO. With the following instrument(s): Curette to remove Viable tissue/material. Material removed includes Skin: Dermis and Skin: Epidermis and after achieving pain control using Lidocaine 4% Topical Solution. No specimens were taken. A time out was conducted at 11:05, prior to the start of the procedure. A Minimum amount of bleeding was controlled with Pressure. The procedure was tolerated well with a pain level of 0 throughout and a pain level of 0 following the procedure. Post Debridement Measurements: 1.4cm length x 1.4cm width x 0.1cm depth; 0.154cm^3 volume. Character of Wound/Ulcer Post Debridement is improved. Severity of Tissue Post Debridement is: Fat layer exposed. Post procedure Diagnosis Wound #2: Same as Pre-Procedure General Notes: Scribed for Dr Mikey Bussing by Brenton Grills RN.. Pre-procedure diagnosis of Wound #2 is a Diabetic Wound/Ulcer of the Lower Extremity located on the Left,Posterior Lower Leg . There was a Three Layer Compression Therapy Procedure by Brenton Grills, RN. Post procedure Diagnosis Wound #2: Same as Pre-Procedure Plan Follow-up Appointments: [name], [name] ([id number]) [id number] Page 9 of 11 Return Appointment in 1 week. - Dr. Mikey Bussing Room 9 Return Appointment in 2 weeks. - Dr Mikey Bussing Rm 7or 9 Please ask front desk to  schedule an appopintment Return appointment in 3 weeks. - Dr Mikey Bussing Rm 7or 9 Please ask front desk to schedule an appopintment Return appointment in 1 month. - Dr Mikey Bussing Rm 7or 9 Please ask front desk to schedule an appopintment Anesthetic: (In clinic) Topical Lidocaine 4% applied to wound bed Cellular or Tissue Based Products: Wound #1 Right,Lateral Lower Leg: Cellular or Tissue Based Product Type: - Apligraf #1 applied 02/28/23 Apligraf #2 applied 03/05/2023 Other Cellular or Tissue Based Products Orders/Instructions: - Already done- Run insurance verification for skin substitute Bathing/ Shower/ Hygiene: May shower with protection but do not get wound dressing(s) wet. Protect dressing(s) with water repellant cover (for example, large plastic bag) or a cast cover and may then take shower. - Please do not get the legs wet. Please use cast protectors when showering. Cast protectors can be purchased from [employer], Medical supply store. Cost ranges from $17-$37 Edema Control - Lymphedema / SCD / Other: Lymphedema Pumps. Use Lymphedema pumps on leg(s) 2-3 times a day for 45-60 minutes. If wearing any wraps or hose, do not remove them. Continue exercising as instructed. - Keep using the Lymphadema Pumps.You may place the th Lyphadema pumps over the compression wraps. Elevate legs to the level of the heart or above for 30 minutes daily and/or when sitting for 3-4 times a day throughout the day. Avoid standing for long periods of time. Exercise regularly - As tolerated Off-Loading: Other: - Keep legs elevated to heart level or above when sitting. You may place a pillow behind the calf for comfort, if so desired. WOUND #1: - Lower Leg Wound Laterality: Right, Lateral Cleanser: Soap and Water 1 x Per Week/30 Days Discharge Instructions: May shower and  wash wound with dial antibacterial soap and water prior to dressing change. Cleanser: Vashe 5.8 (oz) 1 x Per Week/30  Days Discharge Instructions: Cleanse the wound with Vashe prior to applying a clean dressing using gauze sponges, not tissue or cotton balls. Peri-Wound Care: Sween Lotion (Moisturizing lotion) 1 x Per Week/30 Days Discharge Instructions: Apply moisturizing lotion as directed Secondary Dressing: Woven Gauze Sponge, Non-Sterile 4x4 in 1 x Per Week/30 Days Discharge Instructions: Apply over primary dressing as directed. Com pression Wrap: Urgo K2 Lite, (equivalent to a 3 layer) two layer compression system, regular 1 x Per Week/30 Days Discharge Instructions: Apply Urgo K2 Lite as directed (alternative to 3 layer compression). Com pression Wrap: Stockinette 1 x Per Week/30 Days WOUND #2: - Lower Leg Wound Laterality: Left, Posterior Cleanser: Soap and Water 1 x Per Week/30 Days Discharge Instructions: May shower and wash wound with dial antibacterial soap and water prior to dressing change. Cleanser: Vashe 5.8 (oz) 1 x Per Week/30 Days Discharge Instructions: Cleanse the wound with Vashe prior to applying a clean dressing using gauze sponges, not tissue or cotton balls. Peri-Wound Care: Sween Lotion (Moisturizing lotion) 1 x Per Week/30 Days Discharge Instructions: Apply moisturizing lotion as directed Prim Dressing: Promogran Prisma Matrix, 4.34 (sq in) (silver collagen) 1 x Per Week/30 Days ary Discharge Instructions: Moisten collagen with saline or hydrogel Secondary Dressing: Woven Gauze Sponge, Non-Sterile 4x4 in 1 x Per Week/30 Days Discharge Instructions: Apply over primary dressing as directed. Com pression Wrap: Urgo K2 Lite, (equivalent to a 3 layer) two layer compression system, regular 1 x Per Week/30 Days Discharge Instructions: Apply Urgo K2 Lite as directed (alternative to 3 layer compression). Com pression Wrap: Stockinette 1 x Per Week/30 Days 1. In office sharp debridement 2. Apligraf placed in standard fashion 3. Collagen 4. 3 layer compression to lower extremities  bilaterally 5. Follow-up in 1 week Electronic Signature(s) Signed: 03/05/2023 2:47:41 PM By: Geralyn Corwin DO Entered By: Geralyn Corwin on 03/05/2023 08:41:06 -------------------------------------------------------------------------------- HxROS Details Patient Name: Date of Service: [name] 03/05/2023 9:30 A M Medical Record Number: [id number] Patient Account Number: [id number] Date of Birth/Sex: Treating RN: 05-24-1971 (52 y.o. M) Primary Care Provider: Barbette Reichmann Other Clinician: Referring Provider: Treating Provider/Extender: Adolph Pollack Weeks in Treatment: 5 Information Obtained From Patient Constitutional Symptoms [facility]) Medical HistoryMarland Kitchen [name], [name] ([id number]) [id number] Page 10 of 11 Past Medical History Notes: CVA Hematologic/Lymphatic Medical History: Positive for: Anemia Cardiovascular Medical History: Positive for: Deep Vein Thrombosis - both leg; Hypertension; Peripheral Venous Disease Past Medical History Notes: cellulitis right leg 2018 Endocrine Medical History: Positive for: Type II Diabetes - HgbA1c 6.2 (5/24) Time with diabetes: 9 years Treated with: Insulin Blood sugar tested every day: Yes Tested : 3 x day Blood sugar testing results: Breakfast: 97 Genitourinary Medical History: Past Medical History Notes: CKD stage 2 Musculoskeletal Medical History: Positive for: Gout Immunizations Pneumococcal Vaccine: Received Pneumococcal Vaccination: No Implantable Devices None Hospitalization / Surgery History Type of Hospitalization/Surgery scrotal surgery right elbow surgery right elbow septic arthritis IandD x2 with skin graft hernia repair 2022 2018 Ecoli sepsis necrotizing fasciitis Family and Social History Diabetes: No; Heart Disease: Yes; Hypertension: Yes - Mother,Siblings; Kidney Disease: Yes - Father,Siblings; Stroke: Yes - Mother; Never  smoker; Marital Status - Single; Alcohol Use: Never; Drug Use: No History; Caffeine Use: Moderate - soda,coffee; Financial Concerns: No; Food, Clothing or Shelter Needs: No; Support System Lacking: No; Transportation Concerns: No Electronic Signature(s) Signed: 03/05/2023 2:47:41  PM By: Geralyn Corwin DO Entered By: Geralyn Corwin on 03/05/2023 08:34:23 -------------------------------------------------------------------------------- SuperBill Details Patient Name: Date of Service: [name] 03/05/2023 Medical Record Number: [id number] Patient Account Number: [id number] Date of Birth/Sex: Treating RN: 06/08/1971 (52 y.o. [name] Primary Care Provider: Barbette Reichmann Other Clinician: MAVERIK, BUETI ([id number]) [id number] Page 11 of 11 Referring Provider: Treating Provider/Extender: Adolph Pollack Weeks in Treatment: 5 Diagnosis Coding ICD-10 Codes Code Description [id number] Non-pressure chronic ulcer of other part of right lower leg with fat layer exposed L97.822 Non-pressure chronic ulcer of other part of left lower leg with fat layer exposed E11.622 Type 2 diabetes mellitus with other skin ulcer I87.313 Chronic venous hypertension (idiopathic) with ulcer of bilateral lower extremity Facility Procedures : CPT4 Code: 66440347 Description: (Facility Use Only) Apligraf 1 SQ CM Modifier: Quantity: 44 : CPT4 Code: 42595638 Description: 15271 - SKIN SUB GRAFT TRNK/ARM/LEG ICD-10 Diagnosis Description L97.812 Non-pressure chronic ulcer of other part of right lower leg with fat layer exposed E11.622 Type 2 diabetes mellitus with other skin ulcer I87.313 Chronic venous  hypertension (idiopathic) with ulcer of bilateral lower extremity Modifier: Quantity: 1 : CPT4 Code: 75643329 Description: 97597 - DEBRIDE WOUND 1ST 20 SQ CM OR < ICD-10 Diagnosis Description L97.822 Non-pressure chronic ulcer of other part of  left lower leg with fat layer exposed E11.622 Type 2 diabetes mellitus with other skin ulcer I87.313 Chronic venous  hypertension (idiopathic) with ulcer of bilateral lower extremity Modifier: Quantity: 1 Physician Procedures : CPT4 Code Description Modifier [id number] 15271 - WC PHYS SKIN SUB GRAFT TRNK/ARM/LEG ICD-10 Diagnosis Description L97.812 Non-pressure chronic ulcer of other part of right lower leg with fat layer exposed E11.622 Type 2 diabetes mellitus with other skin  ulcer I87.313 Chronic venous hypertension (idiopathic) with ulcer of bilateral lower extremity Quantity: 1 : [id number] 97597 - WC PHYS DEBR WO ANESTH 20 SQ CM ICD-10 Diagnosis Description L97.822 Non-pressure chronic ulcer of other part of left lower leg with fat layer exposed E11.622 Type 2 diabetes mellitus with other skin ulcer I87.313 Chronic venous  hypertension (idiopathic) with ulcer of bilateral lower extremity Quantity: 1 Electronic Signature(s) Signed: 03/05/2023 2:47:41 PM By: Geralyn Corwin DO Entered By: Geralyn Corwin on 03/05/2023 08:42:15

## 2023-03-12 ENCOUNTER — Encounter (HOSPITAL_BASED_OUTPATIENT_CLINIC_OR_DEPARTMENT_OTHER): Payer: Medicaid Other | Admitting: Internal Medicine

## 2023-03-12 DIAGNOSIS — E1122 Type 2 diabetes mellitus with diabetic chronic kidney disease: Secondary | ICD-10-CM | POA: Diagnosis not present

## 2023-03-12 DIAGNOSIS — I87313 Chronic venous hypertension (idiopathic) with ulcer of bilateral lower extremity: Secondary | ICD-10-CM | POA: Diagnosis not present

## 2023-03-12 DIAGNOSIS — L97822 Non-pressure chronic ulcer of other part of left lower leg with fat layer exposed: Secondary | ICD-10-CM

## 2023-03-12 DIAGNOSIS — E11622 Type 2 diabetes mellitus with other skin ulcer: Secondary | ICD-10-CM | POA: Diagnosis not present

## 2023-03-12 DIAGNOSIS — I129 Hypertensive chronic kidney disease with stage 1 through stage 4 chronic kidney disease, or unspecified chronic kidney disease: Secondary | ICD-10-CM | POA: Diagnosis not present

## 2023-03-12 DIAGNOSIS — Z794 Long term (current) use of insulin: Secondary | ICD-10-CM | POA: Diagnosis not present

## 2023-03-12 DIAGNOSIS — L97812 Non-pressure chronic ulcer of other part of right lower leg with fat layer exposed: Secondary | ICD-10-CM

## 2023-03-12 DIAGNOSIS — N182 Chronic kidney disease, stage 2 (mild): Secondary | ICD-10-CM | POA: Diagnosis not present

## 2023-03-12 DIAGNOSIS — Z8249 Family history of ischemic heart disease and other diseases of the circulatory system: Secondary | ICD-10-CM | POA: Diagnosis not present

## 2023-03-12 DIAGNOSIS — Z86718 Personal history of other venous thrombosis and embolism: Secondary | ICD-10-CM | POA: Diagnosis not present

## 2023-03-12 NOTE — Progress Notes (Signed)
[name], [name] ([id number]) [id number] Page 1 of 9 Visit Report for 03/05/2023 Arrival Information Details Patient Name: Date of Service: [name] [name]. 03/05/2023 9:30 A M Medical Record Number: [id number] Patient Account Number: [id number] Date of Birth/Sex: Treating RN: 1971/01/18 (51 y.o. [name] Primary Care [name]: [name] Other Clinician: Referring [name]: Treating [name]/Extender: [name] Weeks in Treatment: 5 Visit Information History Since Last Visit All ordered tests and consults were completed: Yes Patient Arrived: Ambulatory Added or deleted any medications: No Arrival Time: 09:56 Any new allergies or adverse reactions: No Accompanied By: self Had a fall or experienced change in No Transfer Assistance: None activities of daily living that may affect Patient Requires Transmission-Based Precautions: No risk of falls: Patient Has Alerts: No Signs or symptoms of abuse/neglect since last visito No Hospitalized since last visit: No Implantable device outside of the clinic excluding No cellular tissue based products placed in the center since last visit: Has Dressing in Place as Prescribed: Yes Pain Present Now: No Electronic Signature(s) Signed: 03/12/2023 2:03:55 PM By: [name] Entered By: [name] on 03/05/2023 09:59:44 -------------------------------------------------------------------------------- Compression Therapy Details Patient Name: Date of Service: [name] [name] Bulla K. 03/05/2023 9:30 A M Medical Record Number: [id number] Patient Account Number: [id number] Date of Birth/Sex: Treating RN: 1971-01-26 (52 y.o. [name] Primary Care [name]: [name] Other Clinician: Referring [name]: Treating [name]/Extender: [name] Weeks in Treatment: 5 Compression Therapy Performed for Wound Assessment: Wound #1  Right,Lateral Lower Leg Performed By: Clinician [name] Grills, RN Compression Type: Three Layer Post Procedure Diagnosis Same as Pre-procedure Electronic Signature(s) Signed: 03/05/2023 5:13:11 PM By: [name] Stall RN, BSN Entered By: [name] on 03/05/2023 11:02:34 [name] ([id number]) [id number] Page 2 of 9 -------------------------------------------------------------------------------- Compression Therapy Details Patient Name: Date of Service: [name] [name] 03/05/2023 9:30 A M Medical Record Number: [id number] Patient Account Number: [id number] Date of Birth/Sex: Treating RN: 02-17-1971 (52 y.o. [name] Primary Care [name]: [name] Other Clinician: Referring [name]: Treating [name]/Extender: [name] Weeks in Treatment: 5 Compression Therapy Performed for Wound Assessment: Wound #2 Left,Posterior Lower Leg Performed By: Clinician [name] Grills, RN Compression Type: Three Layer Post Procedure Diagnosis Same as Pre-procedure Electronic Signature(s) Signed: 03/05/2023 5:13:11 PM By: [name] Stall RN, BSN Entered By: [name] on 03/05/2023 11:02:50 -------------------------------------------------------------------------------- Encounter Discharge Information Details Patient Name: Date of Service: [name] [name] Bulla K. 03/05/2023 9:30 A M Medical Record Number: [id number] Patient Account Number: [id number] Date of Birth/Sex: Treating RN: 30-Jan-1971 (52 y.o. [name] Primary Care Wayman Hoard: [name] Other Clinician: Referring [name]: Treating [name]/Extender: [name] Weeks in Treatment: 5 Encounter Discharge Information Items Post Procedure Vitals Discharge Condition: Stable Temperature (F): 98.6 Ambulatory Status: Ambulatory Pulse (bpm): 78 Discharge Destination: Home Respiratory Rate (breaths/min): 18 Transportation:  Private Auto Blood Pressure (mmHg): 132/70 Accompanied By: self Schedule Follow-up Appointment: Yes Clinical Summary of Care: Patient Declined Electronic Signature(s) Signed: 03/05/2023 5:13:11 PM By: [name] Stall RN, BSN Entered By: [name] on 03/05/2023 11:07:14 -------------------------------------------------------------------------------- Lower Extremity Assessment Details Patient Name: Date of Service: [name] [name] Bulla K. 03/05/2023 9:30 A M Medical Record Number: [id number] Patient Account Number: [id number] Date of Birth/Sex: Treating RN: 1971-03-25 (52 y.o. [name] Primary Care Kristan Votta: [name] Other Clinician: Referring [name]: Treating [name] Weeks in Treatment: 5 Edema Assessment Assessed: [Left: Yes] [Right: Yes] Edema: [Left: No] [Right: No] Calf Laur, Jaylon  K ([id number]) [id number] Page 3 of 9 Left: Right: Point of Measurement: 34 cm From Medial Instep 34.5 cm 34.5 cm Ankle Left: Right: Point of Measurement: 10 cm From Medial Instep 21.5 cm 21 cm Vascular Assessment Pulses: Dorsalis Pedis Palpable: [Left:Yes] [Right:Yes] Extremity colors, hair growth, and conditions: Extremity Color: [Left:Normal] [Right:Normal] Hair Growth on Extremity: [Left:No] [Right:No] Temperature of Extremity: [Left:Warm] [Right:Warm] Capillary Refill: [Left:< 3 seconds] [Right:< 3 seconds] Dependent Rubor: [Left:No] [Right:No] Blanched when Elevated: [Left:No No] [Right:No No] Toe Nail Assessment Left: Right: Thick: Yes Yes Discolored: Yes Yes Deformed: Yes Yes Improper Length and Hygiene: No No Electronic Signature(s) Signed: 03/12/2023 2:03:55 PM By: [name] Entered By: [name] on 03/05/2023 10:22:57 -------------------------------------------------------------------------------- Multi Wound Chart Details Patient Name: Date of Service: [name] [name] Bulla K. 03/05/2023 9:30 A M Medical Record Number: [id number] Patient Account Number: [id number] Date of Birth/Sex: Treating RN: 1970/09/16 (52 y.o. M) Primary Care Jakevious Hollister: [name] Other Clinician: Referring Makinzie Considine: Treating Aiyden Lauderback/Extender: Marcha Dutton, Vishwanath Weeks in Treatment: 5 Vital Signs Height(in): 70 Pulse(bpm): 64 Weight(lbs): 264 Blood Pressure(mmHg): 131/79 Body Mass Index(BMI): 37.9 Temperature(F): 98.6 Respiratory Rate(breaths/min): 18 [1:Photos:] [N/A:N/A] Right, Lateral Lower Leg Left, Posterior Lower Leg N/A Wound Location: Other Lesion Bite N/A Wounding Event: Diabetic Wound/Ulcer of the Lower Diabetic Wound/Ulcer of the Lower N/A Primary Etiology: Extremity Extremity Anemia, Deep Vein Thrombosis, Anemia, Deep Vein Thrombosis, N/A Comorbid History: Hypertension, Peripheral Venous Hypertension, Peripheral Venous CHARISTOPHER, SHAUT K ([id number]) [id number] Page 4 of 9 Disease, Type II Diabetes, Gout Disease, Type II Diabetes, Gout 01/01/2018 09/14/2022 N/A Date Acquired: 5 5 N/A Weeks of Treatment: Open Open N/A Wound Status: No No N/A Wound Recurrence: 4.9x1.6x0.2 1.4x1.4x0.1 N/A Measurements L x W x D (cm) 6.158 1.539 N/A A (cm) : rea 1.232 0.154 N/A Volume (cm) : 52.90% 53.30% N/A % Reduction in A rea: 76.40% 53.30% N/A % Reduction in Volume: Grade 2 Grade 1 N/A Classification: Medium None Present N/A Exudate A mount: Serosanguineous N/A N/A Exudate Type: red, brown N/A N/A Exudate Color: Thickened Distinct, outline attached N/A Wound Margin: Large (67-100%) None Present (0%) N/A Granulation A mount: Red, Pink N/A N/A Granulation Quality: Small (1-33%) Large (67-100%) N/A Necrotic A mount: Eschar Eschar N/A Necrotic Tissue: Fat Layer (Subcutaneous Tissue): Yes Fascia: No N/A Exposed Structures: Fascia: No Fat Layer (Subcutaneous Tissue): No Tendon: No Tendon: No Muscle:  No Muscle: No Joint: No Joint: No Bone: No Bone: No Small (1-33%) Large (67-100%) N/A Epithelialization: Debridement - Excisional Debridement - Selective/Open Wound N/A Debridement: Pre-procedure Verification/Time Out 11:00 11:05 N/A Taken: Lidocaine 4% Topical Solution Lidocaine 4% Topical Solution N/A Pain Control: Subcutaneous, Slough N/A N/A Tissue Debrided: Skin/Subcutaneous Tissue Skin/Epidermis N/A Level: 6.15 1.54 N/A Debridement A (sq cm): rea Curette Curette N/A Instrument: Minimum Minimum N/A Bleeding: Pressure Pressure N/A Hemostasis A chieved: 0 0 N/A Procedural Pain: 0 0 N/A Post Procedural Pain: Procedure was tolerated well Procedure was tolerated well N/A Debridement Treatment Response: 4.9x1.6x0.2 1.4x1.4x0.1 N/A Post Debridement Measurements L x W x D (cm) 1.232 0.154 N/A Post Debridement Volume: (cm) Excoriation: No Excoriation: No N/A Periwound Skin Texture: Induration: No Induration: No Callus: No Callus: No Crepitus: No Crepitus: No Rash: No Rash: No Scarring: No Scarring: No Maceration: No Maceration: No N/A Periwound Skin Moisture: Dry/Scaly: No Dry/Scaly: No Hemosiderin Staining: Yes Hemosiderin Staining: Yes N/A Periwound Skin Color: Atrophie Blanche: No Atrophie Blanche: No Cyanosis: No Cyanosis: No Ecchymosis: No Ecchymosis: No Erythema: No Erythema: No Mottled: No Mottled: No Pallor: No  Pallor: No Rubor: No Rubor: No No Abnormality No Abnormality N/A Temperature: Cellular or Tissue Based Product Compression Therapy N/A Procedures Performed: Compression Therapy Debridement Debridement Treatment Notes Wound #1 (Lower Leg) Wound Laterality: Right, Lateral Cleanser Soap and Water Discharge Instruction: May shower and wash wound with dial antibacterial soap and water prior to dressing change. Vashe 5.8 (oz) Discharge Instruction: Cleanse the wound with Vashe prior to applying a clean dressing using gauze  sponges, not tissue or cotton balls. Peri-Wound Care Sween Lotion (Moisturizing lotion) Discharge Instruction: Apply moisturizing lotion as directed Topical Primary Dressing Secondary Dressing Woven Gauze Sponge, Non-Sterile 4x4 in Discharge Instruction: Apply over primary dressing as directed. KANON, RAUSCH ([id number]) [id number] Page 5 of 9 Secured With Compression Wrap Urgo K2 Lite, (equivalent to a 3 layer) two layer compression system, regular Discharge Instruction: Apply Urgo K2 Lite as directed (alternative to 3 layer compression). Stockinette Compression Stockings Add-Ons Wound #2 (Lower Leg) Wound Laterality: Left, Posterior Cleanser Soap and Water Discharge Instruction: May shower and wash wound with dial antibacterial soap and water prior to dressing change. Vashe 5.8 (oz) Discharge Instruction: Cleanse the wound with Vashe prior to applying a clean dressing using gauze sponges, not tissue or cotton balls. Peri-Wound Care Sween Lotion (Moisturizing lotion) Discharge Instruction: Apply moisturizing lotion as directed Topical Primary Dressing Promogran Prisma Matrix, 4.34 (sq in) (silver collagen) Discharge Instruction: Moisten collagen with saline or hydrogel Secondary Dressing Woven Gauze Sponge, Non-Sterile 4x4 in Discharge Instruction: Apply over primary dressing as directed. Secured With Compression Wrap Urgo K2 Lite, (equivalent to a 3 layer) two layer compression system, regular Discharge Instruction: Apply Urgo K2 Lite as directed (alternative to 3 layer compression). Stockinette Compression Stockings Facilities manager) Signed: 03/05/2023 2:47:41 PM By: Geralyn Corwin DO Entered By: Geralyn Corwin on 03/05/2023 11:28:27 -------------------------------------------------------------------------------- Multi-Disciplinary Care Plan Details Patient Name: Date of Service: [name] [name] Bulla K. 03/05/2023 9:30 A  M Medical Record Number: [id number] Patient Account Number: [id number] Date of Birth/Sex: Treating RN: 1971/07/11 (52 y.o. [name] Primary Care Evelyn Aguinaldo: [name] Other Clinician: Referring Novali Vollman: Treating Ryley Teater/Extender: [name] Weeks in Treatment: 5 Active Inactive Wound/Skin Impairment Nursing Diagnoses: Impaired tissue integrity MYA, CASERTA ([id number]) [id number] Page 6 of 9 Goals: Patient/caregiver will verbalize understanding of skin care regimen Date Initiated: 01/25/2023 Target Resolution Date: 05/16/2023 Goal Status: Active Interventions: Assess ulceration(s) every visit Treatment Activities: Skin care regimen initiated : 01/25/2023 Notes: Electronic Signature(s) Signed: 03/12/2023 2:03:55 PM By: [name] Entered By: [name] on 03/05/2023 10:26:58 -------------------------------------------------------------------------------- Pain Assessment Details Patient Name: Date of Service: [name] [name] Bulla K. 03/05/2023 9:30 A M Medical Record Number: [id number] Patient Account Number: [id number] Date of Birth/Sex: Treating RN: [birth year] (52 y.o. [name] Primary Care Saverio Kader: [name] Other Clinician: Referring Corydon Schweiss: Treating Alejandra Hunt/Extender: [name] Weeks in Treatment: 5 Active Problems Location of Pain Severity and Description of Pain Patient Has Paino No Site Locations Pain Management and Medication Current Pain Management: Electronic Signature(s) Signed: 03/12/2023 2:03:55 PM By: [name] Entered By: [name] on 03/05/2023 10:00:37 [name] ([id number]) [id number] Page 7 of 9 -------------------------------------------------------------------------------- Patient/Caregiver Education Details Patient Name: Date of Service: [name] [name] 8/20/2024andnbsp9:30 A  M Medical Record Number: [id number] Patient Account Number: [id number] Date of Birth/Gender: Treating RN: 05-07-1971 (52 y.o. [name] Primary Care Physician: [name] Other Clinician: Referring Physician: Treating Physician/Extender: [name] Weeks in Treatment: 5 Education Assessment Education Provided To: Patient  Education Topics Provided Wound/Skin Impairment: Methods: Explain/Verbal Responses: State content correctly Electronic Signature(s) Signed: 03/12/2023 2:03:55 PM By: [name] Entered By: [name] on 03/05/2023 10:27:20 -------------------------------------------------------------------------------- Wound Assessment Details Patient Name: Date of Service: [name] [name] Bulla K. 03/05/2023 9:30 A M Medical Record Number: [id number] Patient Account Number: [id number] Date of Birth/Sex: Treating RN: [birth year] (52 y.o. 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 Weeks Of Treatment: 5 Comorbid Anemia, Deep Vein Thrombosis, Hypertension, Peripheral Venous Clustered Wound: No History: Disease, Type II Diabetes, Gout Photos Wound Measurements Length: (cm) 4.9 Width: (cm) 1.6 Depth: (cm) 0.2 Area: (cm) 6.158 Volume: (cm) 1.232 % Reduction in Area: 52.9% % Reduction in Volume: 76.4% Epithelialization: Small (1-33%) Tunneling: No Undermining: No Wound Description ALVIS, LUBA ([id number]) Classification: Grade 2 Wound Margin: Thickened Exudate Amount: Medium Exudate Type: Serosanguineous Exudate Color:  red, brown [id number] Page 8 of 9 Foul Odor After Cleansing: No Slough/Fibrino Yes Wound Bed Granulation Amount: Large (67-100%) Exposed Structure Granulation Quality: Red, Pink Fascia Exposed: No Necrotic Amount: Small (1-33%) Fat Layer (Subcutaneous Tissue) Exposed: Yes Necrotic Quality: Eschar Tendon Exposed: No Muscle Exposed: No Joint Exposed: No Bone Exposed: No Periwound Skin Texture Texture Color No Abnormalities Noted: No No Abnormalities Noted: No Callus: No Atrophie Blanche: No Crepitus: No Cyanosis: No Excoriation: No Ecchymosis: No Induration: No Erythema: No Rash: No Hemosiderin Staining: Yes Scarring: No Mottled: No Pallor: No Moisture Rubor: No No Abnormalities Noted: No Dry / Scaly: No Temperature / Pain Maceration: No Temperature: No Abnormality Electronic Signature(s) Signed: 03/05/2023 5:13:11 PM By: [name] Stall RN, BSN Signed: 03/12/2023 2:03:55 PM By: [name] Entered By: [name] on 03/05/2023 10:26:08 -------------------------------------------------------------------------------- Wound Assessment Details Patient Name: Date of Service: [name] [name] Bulla K. 03/05/2023 9:30 A M Medical Record Number: [id number] Patient Account Number: [id number] Date of Birth/Sex: Treating RN: December 31, 1970 (52 y.o. [name] Primary Care Lisaanne Lawrie: [name] Other Clinician: Referring Chiniqua Kilcrease: Treating Mariane Burpee/Extender: [name] Weeks in Treatment: 5 Wound Status Wound Number: 2 Primary Diabetic Wound/Ulcer of the Lower Extremity Etiology: Wound Location: Left, Posterior Lower Leg Wound Open Wounding Event: Bite Status: Date Acquired: 09/14/2022 Comorbid Anemia, Deep Vein Thrombosis, Hypertension, Peripheral Venous Weeks Of Treatment: 5 History: Disease, Type II Diabetes, Gout Clustered Wound: No Photos Wound Measurements EASTEN, SAVARINO K ([id number]) Length: (cm)  1.4 Width: (cm) 1.4 Depth: (cm) 0.1 Area: (cm) 1.539 Volume: (cm) 0.154 [id number] Page 9 of 9 % Reduction in Area: 53.3% % Reduction in Volume: 53.3% Epithelialization: Large (67-100%) Tunneling: No Undermining: No Wound Description Classification: Grade 1 Wound Margin: Distinct, outline attached Exudate Amount: None Present Foul Odor After Cleansing: No Slough/Fibrino No Wound Bed Granulation Amount: None Present (0%) Exposed Structure Necrotic Amount: Large (67-100%) Fascia Exposed: No Necrotic Quality: Eschar Fat Layer (Subcutaneous Tissue) Exposed: No Tendon Exposed: No Muscle Exposed: No Joint Exposed: No Bone Exposed: No Periwound Skin Texture Texture Color No Abnormalities Noted: No No Abnormalities Noted: No Callus: No Atrophie Blanche: No Crepitus: No Cyanosis: No Excoriation: No Ecchymosis: No Induration: No Erythema: No Rash: No Hemosiderin Staining: Yes Scarring: No Mottled: No Pallor: No Moisture Rubor: No No Abnormalities Noted: No Dry / Scaly: No Temperature /  Pain Maceration: No Temperature: No Abnormality Electronic Signature(s) Signed: 03/05/2023 5:13:11 PM By: [name] Stall RN, BSN Signed: 03/12/2023 2:03:55 PM By: [name] Entered By: [name] on 03/05/2023 10:26:46 -------------------------------------------------------------------------------- Vitals Details Patient Name: Date of Service: [name] [name] Bulla K. 03/05/2023 9:30 A M Medical Record Number: [id number] Patient Account Number: [id number] Date of Birth/Sex: Treating RN: 1971/01/28 (52 y.o. [name] Primary Care Florentino Laabs: [name] Other Clinician: Referring Daisey Caloca: Treating Johnella Crumm/Extender: [name] Weeks in Treatment: 5 Vital Signs Time Taken: 09:50 Temperature (F): 98.6 Height (in): 70 Pulse (bpm): 64 Weight (lbs): 264 Respiratory Rate (breaths/min): 18 Body Mass Index (BMI):  37.9 Blood Pressure (mmHg): 131/79 Reference Range: 80 - 120 mg / dl Electronic Signature(s) Signed: 03/12/2023 2:03:55 PM By: [name] Entered By: [name] on 03/05/2023 10:00:13

## 2023-03-12 NOTE — Progress Notes (Signed)
EGE, MAVITY ([id number]) [id number] Page 1 of 8 Visit Report for 03/12/2023 Arrival Information Details Patient Name: Date of Service: [name] 03/12/2023 9:30 A M Medical Record Number: [id number] Patient Account Number: [id number] Date of Birth/Sex: Treating RN: 11-Aug-1970 (52 y.o. [name] Primary Care Quincey Quesinberry: Barbette Reichmann Other Clinician: Referring Radames Mejorado: Treating Ellayna Hilligoss/Extender: Adolph Pollack Weeks in Treatment: 6 Visit Information History Since Last Visit All ordered tests and consults were completed: Yes Patient Arrived: Ambulatory Added or deleted any medications: No Arrival Time: 09:37 Any new allergies or adverse reactions: No Accompanied By: self Had a fall or experienced change in No Transfer Assistance: None activities of daily living that may affect Patient Identification Verified: Yes risk of falls: Secondary Verification Process Completed: Yes Signs or symptoms of abuse/neglect since last visito No Patient Requires Transmission-Based Precautions: No Hospitalized since last visit: No Patient Has Alerts: No Implantable device outside of the clinic excluding No cellular tissue based products placed in the center since last visit: Has Dressing in Place as Prescribed: Yes Pain Present Now: No Electronic Signature(s) Signed: 03/12/2023 2:02:59 PM By: Brenton Grills Entered By: Brenton Grills on 03/12/2023 09:37:41 -------------------------------------------------------------------------------- Compression Therapy Details Patient Name: Date of Service: [name] 03/12/2023 9:30 A M Medical Record Number: [id number] Patient Account Number: [id number] Date of Birth/Sex: Treating RN: 10/27/1970 (52 y.o. Valma Cava Primary Care Aymara Sassi: Barbette Reichmann Other Clinician: Referring Bria Portales: Treating Yariah Selvey/Extender: Adolph Pollack Weeks in  Treatment: 6 Compression Therapy Performed for Wound Assessment: Wound #1 Right,Lateral Lower Leg Performed By: Clinician Tommie Ard, RN Compression Type: Three Layer Post Procedure Diagnosis Same as Pre-procedure Notes Urgo lite Electronic Signature(s) Signed: 03/12/2023 2:02:10 PM By: Tommie Ard RN Entered By: Tommie Ard on 03/12/2023 10:04:39 [name] ([id number]) [id number] Page 2 of 8 -------------------------------------------------------------------------------- Compression Therapy Details Patient Name: Date of Service: [name] 03/12/2023 9:30 A M Medical Record Number: [id number] Patient Account Number: [id number] Date of Birth/Sex: Treating RN: 07/31/1970 (52 y.o. Valma Cava Primary Care Roselin Wiemann: Barbette Reichmann Other Clinician: Referring Kaelon Weekes: Treating Gabriele Zwilling/Extender: Adolph Pollack Weeks in Treatment: 6 Compression Therapy Performed for Wound Assessment: Wound #2 Left,Posterior Lower Leg Performed By: Clinician Tommie Ard, RN Compression Type: Three Layer Post Procedure Diagnosis Same as Pre-procedure Notes Urgo lite Electronic Signature(s) Signed: 03/12/2023 2:02:10 PM By: Tommie Ard RN Entered By: Tommie Ard on 03/12/2023 10:04:39 -------------------------------------------------------------------------------- Lower Extremity Assessment Details Patient Name: Date of Service: [name] 03/12/2023 9:30 A M Medical Record Number: [id number] Patient Account Number: [id number] Date of Birth/Sex: Treating RN: 06-26-1971 (52 y.o. [name] Primary Care Keyonta Madrid: Barbette Reichmann Other Clinician: Referring Ahnika Hannibal: Treating Peace Noyes/Extender: Adolph Pollack Weeks in Treatment: 6 Edema Assessment Assessed: [Left: No] [Right: No] Edema: [Left: No] [Right: No] Calf Left: Right: Point of Measurement: 34 cm From Medial Instep  34.5 cm 33.4 cm Ankle Left: Right: Point of Measurement: 10 cm From Medial Instep 21.4 cm 21.3 cm Vascular Assessment Pulses: Dorsalis Pedis Palpable: [Left:Yes] [Right:Yes] Extremity colors, hair growth, and conditions: Extremity Color: [Left:Normal] [Right:Normal] Hair Growth on Extremity: [Left:No] [Right:No] Temperature of Extremity: [Left:Warm] [Right:Warm] Capillary Refill: [Left:< 3 seconds] [Right:< 3 seconds] Dependent Rubor: [Left:No No] [Right:No No] Toe Nail Assessment Left: RightSTATLER, BROAS ([id number]) [id number] Page 3 of 8 Thick: Yes Yes Discolored: Yes Yes Deformed: Yes Yes Improper Length and Hygiene: Yes Yes Electronic Signature(s) Signed: 03/12/2023 2:02:59 PM By: Brenton Grills  Entered By: Brenton Grills on 03/12/2023 09:46:03 -------------------------------------------------------------------------------- Multi Wound Chart Details Patient Name: Date of Service: [name] 03/12/2023 9:30 A M Medical Record Number: [id number] Patient Account Number: [id number] Date of Birth/Sex: Treating RN: 03-24-1971 (52 y.o. M) Primary Care Simone Rodenbeck: Barbette Reichmann Other Clinician: Referring Deleon Passe: Treating Dash Cardarelli/Extender: Marcha Dutton, Vishwanath Weeks in Treatment: 6 Vital Signs Height(in): 70 Pulse(bpm): 65 Weight(lbs): 264 Blood Pressure(mmHg): 129/78 Body Mass Index(BMI): 37.9 Temperature(F): 98.1 Respiratory Rate(breaths/min): 18 [1:Photos:] [N/A:N/A] Right, Lateral Lower Leg Left, Posterior Lower Leg N/A Wound Location: Other Lesion Bite N/A Wounding Event: Diabetic Wound/Ulcer of the Lower Diabetic Wound/Ulcer of the Lower N/A Primary Etiology: Extremity Extremity Anemia, Deep Vein Thrombosis, Anemia, Deep Vein Thrombosis, N/A Comorbid History: Hypertension, Peripheral Venous Hypertension, Peripheral Venous Disease, Type II Diabetes, Gout Disease, Type II Diabetes, Gout 01/01/2018  09/14/2022 N/A Date Acquired: 6 6 N/A Weeks of Treatment: Open Open N/A Wound Status: No No N/A Wound Recurrence: 4.2x1.6x0.2 0.2x0.2x0.1 N/A Measurements L x W x D (cm) 5.278 0.031 N/A A (cm) : rea 1.056 0.003 N/A Volume (cm) : 59.60% 99.10% N/A % Reduction in A rea: 79.80% 99.10% N/A % Reduction in Volume: 10 Starting Position 1 (o'clock): 3 Ending Position 1 (o'clock): 0.3 Maximum Distance 1 (cm): Yes N/A N/A Undermining: Grade 2 Grade 1 N/A Classification: Medium None Present N/A Exudate A mount: Serosanguineous N/A N/A Exudate Type: red, brown N/A N/A Exudate Color: Thickened Distinct, outline attached N/A Wound Margin: Large (67-100%) None Present (0%) N/A Granulation A mount: Red, Pink N/A N/A Granulation Quality: Small (1-33%) Large (67-100%) N/A Necrotic A mount: Eschar Eschar N/A Necrotic Tissue: Fat Layer (Subcutaneous Tissue): Yes Fascia: No N/A Exposed Structures: Fascia: No Fat Layer (Subcutaneous Tissue): No Tendon: No Tendon: No Muscle: No Muscle: No Joint: No Joint: No SKYLIN, ROOME ([id number]) [id number] Page 4 of 8 Bone: No Bone: No Small (1-33%) Large (67-100%) N/A Epithelialization: Debridement - Excisional N/A N/A Debridement: 09:55 N/A N/A Pre-procedure Verification/Time Out Taken: Subcutaneous, Slough N/A N/A Tissue Debrided: Skin/Subcutaneous Tissue N/A N/A Level: 5.28 N/A N/A Debridement A (sq cm): rea Curette N/A N/A Instrument: Minimum N/A N/A Bleeding: Pressure N/A N/A Hemostasis A chieved: Procedure was tolerated well N/A N/A Debridement Treatment Response: 4.2x1.6x0.2 N/A N/A Post Debridement Measurements L x W x D (cm) 1.Mucia.Pester N/A N/A Post Debridement Volume: (cm) Excoriation: No Excoriation: No N/A Periwound Skin Texture: Induration: No Induration: No Callus: No Callus: No Crepitus: No Crepitus: No Rash: No Rash: No Scarring: No Scarring: No Maceration:  No Maceration: No N/A Periwound Skin Moisture: Dry/Scaly: No Dry/Scaly: No Hemosiderin Staining: Yes Hemosiderin Staining: Yes N/A Periwound Skin Color: Atrophie Blanche: No Atrophie Blanche: No Cyanosis: No Cyanosis: No Ecchymosis: No Ecchymosis: No Erythema: No Erythema: No Mottled: No Mottled: No Pallor: No Pallor: No Rubor: No Rubor: No No Abnormality No Abnormality N/A Temperature: Cellular or Tissue Based Product Compression Therapy N/A Procedures Performed: Compression Therapy Debridement Treatment Notes Electronic Signature(s) Signed: 03/12/2023 12:58:11 PM By: Geralyn Corwin DO Entered By: Geralyn Corwin on 03/12/2023 11:36:39 -------------------------------------------------------------------------------- Multi-Disciplinary Care Plan Details Patient Name: Date of Service: [name] 03/12/2023 9:30 A M Medical Record Number: [id number] Patient Account Number: [id number] Date of Birth/Sex: Treating RN: [birth year] (52 y.o. Valma Cava Primary Care Keltin Baird: Barbette Reichmann Other Clinician: Referring Pryce Folts: Treating Tevis Conger/Extender: Adolph Pollack Weeks in Treatment: 6 Active Inactive Wound/Skin Impairment Nursing Diagnoses: Impaired tissue integrity Goals: Patient/caregiver will verbalize understanding of skin care regimen Date Initiated: 01/25/2023  Target Resolution Date: 05/16/2023 Goal Status: Active Interventions: Assess ulceration(s) every visit Treatment Activities: Skin care regimen initiated : 01/25/2023 Notes: NIKOLAJ, AUGER ([id number]) [id number].pdf Page 5 of 8 Electronic Signature(s) Signed: 03/12/2023 2:02:10 PM By: Tommie Ard RN Entered By: Tommie Ard on 03/12/2023 09:56:04 -------------------------------------------------------------------------------- Pain Assessment Details Patient Name: Date of Service: [name] 03/12/2023 9:30 A M Medical  Record Number: [id number] Patient Account Number: [id number] Date of Birth/Sex: Treating RN: 12-16-1970 (52 y.o. [name] Primary Care Jarren Para: Barbette Reichmann Other Clinician: Referring Chaynce Schafer: Treating Jerico Grisso/Extender: Adolph Pollack Weeks in Treatment: 6 Active Problems Location of Pain Severity and Description of Pain Patient Has Paino No Site Locations Pain Management and Medication Current Pain Management: Electronic Signature(s) Signed: 03/12/2023 2:02:59 PM By: Brenton Grills Entered By: Brenton Grills on 03/12/2023 09:40:20 -------------------------------------------------------------------------------- Patient/Caregiver Education Details Patient Name: Date of Service: [name] 8/27/2024andnbsp9:30 A M Medical Record Number: [id number] Patient Account Number: [id number] Date of Birth/Gender: Treating RN: 02/10/1971 (52 y.o. Valma Cava Primary Care Physician: Barbette Reichmann Other Clinician: Referring Physician: Treating Physician/Extender: Milly Jakob in Treatment: 6 Education Assessment Education Provided ToJYQUEZ, VENEGAS ([id number]) [id number] Page 6 of 8 Patient Education Topics Provided Wound Debridement: Methods: Explain/Verbal Responses: Reinforcements needed, State content correctly Wound/Skin Impairment: Methods: Explain/Verbal Responses: Reinforcements needed, State content correctly Electronic Signature(s) Signed: 03/12/2023 2:02:10 PM By: Tommie Ard RN Entered By: Tommie Ard on 03/12/2023 10:04:03 -------------------------------------------------------------------------------- Wound Assessment Details Patient Name: Date of Service: [name] 03/12/2023 9:30 A M Medical Record Number: [id number] Patient Account Number: [id number] Date of Birth/Sex: Treating RN: 10/19/1970 (52 y.o. [name] Primary Care  Roberto Hlavaty: Barbette Reichmann Other Clinician: Referring Shawnna Pancake: Treating Margerie Fraiser/Extender: Adolph Pollack Weeks in Treatment: 6 Wound Status Wound Number: 1 Primary Diabetic Wound/Ulcer of the Lower Extremity Etiology: Wound Location: Right, Lateral Lower Leg Wound Open Wounding Event: Other Lesion Status: Date Acquired: 01/01/2018 Notes: Pt. 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 Weeks Of Treatment: 6 Comorbid Anemia, Deep Vein Thrombosis, Hypertension, Peripheral Venous Clustered Wound: No History: Disease, Type II Diabetes, Gout Photos Wound Measurements Length: (cm) 4.2 Width: (cm) 1.6 Depth: (cm) 0.2 Area: (cm) 5.278 Volume: (cm) 1.056 % Reduction in Area: 59.6% % Reduction in Volume: 79.8% Epithelialization: Small (1-33%) Tunneling: No Undermining: Yes Starting Position (o'clock): 10 Ending Position (o'clock): 3 Maximum Distance: (cm) 0.3 Wound Description Classification: Grade 2 Wound Margin: Thickened Exudate Amount: Medium Exudate Type: Serosanguineous Exudate Color: red, brown Pollinger, Elia K ([id number]) Wound Bed Granulation Amount: Large (67-100 Granulation Quality: Red, Pink Necrotic Amount: Small (1-33%) Necrotic Quality: Eschar Foul Odor After Cleansing: No Slough/Fibrino Yes [id number] Page 7 of 8 %) Exposed Structure Fascia Exposed: No Fat Layer (Subcutaneous Tissue) Exposed: Yes Tendon Exposed: No Muscle Exposed: No Joint Exposed: No Bone Exposed: No Periwound Skin Texture Texture Color No Abnormalities Noted: No No Abnormalities Noted: No Callus: No Atrophie Blanche: No Crepitus: No Cyanosis: No Excoriation: No Ecchymosis: No Induration: No Erythema: No Rash: No Hemosiderin Staining: Yes Scarring: No Mottled: No Pallor: No Moisture Rubor: No No Abnormalities Noted: No Dry / Scaly: No Temperature / Pain Maceration: No Temperature: No Abnormality Electronic  Signature(s) Signed: 03/12/2023 2:02:10 PM By: Tommie Ard RN Signed: 03/12/2023 2:02:59 PM By: Brenton Grills Entered By: Tommie Ard on 03/12/2023 09:49:47 -------------------------------------------------------------------------------- Wound Assessment Details Patient Name: Date of Service: [name] 03/12/2023 9:30 A M Medical Record Number: [id number]  Patient Account Number: [id number] Date of Birth/Sex: Treating RN: October 26, 1970 (52 y.o. [name] Primary Care Joniqua Sidle: Barbette Reichmann Other Clinician: Referring Valerio Pinard: Treating Shanta Dorvil/Extender: Adolph Pollack Weeks in Treatment: 6 Wound Status Wound Number: 2 Primary Diabetic Wound/Ulcer of the Lower Extremity Etiology: Wound Location: Left, Posterior Lower Leg Wound Open Wounding Event: Bite Status: Date Acquired: 09/14/2022 Comorbid Anemia, Deep Vein Thrombosis, Hypertension, Peripheral Venous Weeks Of Treatment: 6 History: Disease, Type II Diabetes, Gout Clustered Wound: No Photos Wound Measurements Length: (cm) 0.2 Width: (cm) 0.2 Depth: (cm) 0.1 Area: (cm) 0.031 Volume: (cm) 0.003 Aslin, Lowery K ([id number]) % Reduction in Area: 99.1% % Reduction in Volume: 99.1% Epithelialization: Large (67-100%) [id number] Page 8 of 8 Wound Description Classification: Grade 1 Wound Margin: Distinct, outline attached Exudate Amount: None Present Foul Odor After Cleansing: No Slough/Fibrino No Wound Bed Granulation Amount: None Present (0%) Exposed Structure Necrotic Amount: Large (67-100%) Fascia Exposed: No Necrotic Quality: Eschar Fat Layer (Subcutaneous Tissue) Exposed: No Tendon Exposed: No Muscle Exposed: No Joint Exposed: No Bone Exposed: No Periwound Skin Texture Texture Color No Abnormalities Noted: No No Abnormalities Noted: No Callus: No Atrophie Blanche: No Crepitus: No Cyanosis: No Excoriation: No Ecchymosis:  No Induration: No Erythema: No Rash: No Hemosiderin Staining: Yes Scarring: No Mottled: No Pallor: No Moisture Rubor: No No Abnormalities Noted: No Dry / Scaly: No Temperature / Pain Maceration: No Temperature: No Abnormality Electronic Signature(s) Signed: 03/12/2023 2:02:10 PM By: Tommie Ard RN Signed: 03/12/2023 2:02:59 PM By: Brenton Grills Entered By: Tommie Ard on 03/12/2023 09:50:25 -------------------------------------------------------------------------------- Vitals Details Patient Name: Date of Service: [name] 03/12/2023 9:30 A M Medical Record Number: [id number] Patient Account Number: [id number] Date of Birth/Sex: Treating RN: Jul 03, 1971 (52 y.o. [name] Primary Care Micael Barb: Barbette Reichmann Other Clinician: Referring Jakira Mcfadden: Treating Izrael Peak/Extender: Adolph Pollack Weeks in Treatment: 6 Vital Signs Time Taken: 09:39 Temperature (F): 98.1 Height (in): 70 Pulse (bpm): 65 Weight (lbs): 264 Respiratory Rate (breaths/min): 18 Body Mass Index (BMI): 37.9 Blood Pressure (mmHg): 129/78 Reference Range: 80 - 120 mg / dl Electronic Signature(s) Signed: 03/12/2023 2:02:59 PM By: Brenton Grills Entered By: Brenton Grills on 03/12/2023 09:40:10

## 2023-03-12 NOTE — Progress Notes (Signed)
[name] ([id number]) [id number] Page 1 of 10 Visit Report for 03/12/2023 Chief Complaint Document Details Patient Name: Date of Service: [name]. 03/12/2023 9:30 A M Medical Record Number: [id number] Patient Account Number: [id number] Date of Birth/Sex: Treating RN: [birth year] (52 y.o. M) Primary Care Provider: Barbette Price Other Clinician: Referring Provider: Treating Provider/Extender: [name]: 6 Information Obtained from: Patient Chief Complaint 01/25/2023; bilateral lower extremity wounds Electronic Signature(s) Signed: 03/12/2023 12:58:11 PM By: Geralyn Corwin DO Entered By: Geralyn Corwin on 03/12/2023 08:37:30 -------------------------------------------------------------------------------- Cellular or Tissue Based Product Details Patient Name: Date of Service: [name] [name] Price. 03/12/2023 9:30 A M Medical Record Number: [id number] Patient Account Number: [id number] Date of Birth/Sex: Treating RN: 1970/12/24 (52 y.o. [name] Primary Care Provider: Barbette Price Other Clinician: Referring Provider: Treating Provider/Extender: [name]: 6 Cellular or Tissue Based Product Type Wound #1 Right,Lateral Lower Leg Applied to: Performed By: Physician Geralyn Corwin, DO Cellular or Tissue Based Product Type: Apligraf Level of Consciousness (Pre-procedure): Awake and Alert Pre-procedure Verification/Time Out Yes - 09:58 Taken: Location: trunk / arms / legs Wound Size (sq cm): 6.72 Product Size (sq cm): 44 Waste Size (sq cm): 26 Waste Reason: size of wound Amount of Product Applied (sq cm): 18 Instrument Used: Blade Lot #: [id number] Order #: 3 Expiration Date: 03/19/2023 Fenestrated: Yes Instrument: Blade Reconstituted: Yes Solution Type: normal saline Solution Amount: 10mL Lot #: [id number] KS Solution  Expiration Date: 08/06/2024 Secured: Yes Dressing Applied: Yes Primary Dressing: Adaptec Procedural Pain: 0 Post Procedural Pain: 0 Response to Price: Procedure was tolerated well [name] ([id number]) [id number] Page 2 of 10 Level of Consciousness (Post- Awake and Alert procedure): Post Procedure Diagnosis Same as Pre-procedure Notes Scribed for Dr. Mikey Bussing by Tommie Ard, RN Electronic Signature(s) Signed: 03/12/2023 12:58:11 PM By: Geralyn Corwin DO Signed: 03/12/2023 2:02:10 PM By: Tommie Ard RN Entered By: Tommie Ard on 03/12/2023 07:03:33 -------------------------------------------------------------------------------- Debridement Details Patient Name: Date of Service: [name] [name] Price. 03/12/2023 9:30 A M Medical Record Number: [id number] Patient Account Number: [id number] Date of Birth/Sex: Treating RN: [birth year] (52 y.o. [name] Primary Care Provider: Barbette Price Other Clinician: Referring Provider: Treating Provider/Extender: [name]: 6 Debridement Performed for Assessment: Wound #1 Right,Lateral Lower Leg Performed By: Physician Geralyn Corwin, DO Debridement Type: Debridement Severity of Tissue Pre Debridement: Fat layer exposed Level of Consciousness (Pre-procedure): Awake and Alert Pre-procedure Verification/Time Out Yes - 09:55 Taken: Start Time: 09:56 Percent of Wound Bed Debrided: 100% T Area Debrided (cm): otal 5.28 Tissue and other material debrided: Viable, Non-Viable, Slough, Subcutaneous, Slough Level: Skin/Subcutaneous Tissue Debridement Description: Excisional Instrument: Curette Bleeding: Minimum Hemostasis Achieved: Pressure Response to Price: Procedure was tolerated well Level of Consciousness (Post- Awake and Alert procedure): Post Debridement Measurements of Total Wound Length: (cm) 4.2 Width: (cm) 1.6 Depth: (cm)  0.2 Volume: (cm) 1.056 Character of Wound/Ulcer Post Debridement: Requires Further Debridement Severity of Tissue Post Debridement: Fat layer exposed Post Procedure Diagnosis Same as Pre-procedure Notes Scribed for Dr. Mikey Bussing by Tommie Ard, RN Electronic Signature(s) Signed: 03/12/2023 12:58:11 PM By: Geralyn Corwin DO Signed: 03/12/2023 2:02:10 PM By: Tommie Ard RN Entered By: Tommie Ard on 03/12/2023 06:58:09 [name] ([id number]) [id number] Page 3 of 10 -------------------------------------------------------------------------------- HPI Details Patient Name: Date of Service: [name]. 03/12/2023 9:30 A M Medical Record Number: [id number] Patient Account Number: [id number] Date of Birth/Sex: Treating  RN: 10-15-1970 (52 y.o. M) Primary Care Provider: Barbette Price Other Clinician: Referring Provider: Treating Provider/Extender: [name]: 6 History of Present Illness HPI Description: 01/25/2023 [name] is a 52 year old male with a past medical history of controlled type 2 diabetes on insulin, venous insufficiency and DVT that presents to the clinic for bilateral lower extremity wounds. He states that the wound on the left was started by a bug bite and has been present for the past 4 months. The right lower extremity wound he states started out as a soft tissue infection and has been present for the past 6 years. He has been treated by a wound care center in [facility] [place]. He was last seen in the wound care center 2 years ago Could not continue following up due to lack of transportation. He states that recently the rehab center has been wrapping his legs with compression wrap and a wound dressing underneath. It is unclear when the last time this happened was. He is not wearing any compression today. He currently denies signs of  infection. 7/19; patient presents for follow-up. We have been using antibiotic ointment with Hydrofera Blue under 3 layer compression to the lower extremities bilaterally. Wounds are smaller. 7/30; patient presents for follow-up. We have been using antibiotic ointment and Hydrofera Blue under 3 layer compression to the lower extremities bilaterally. Wounds are smaller. 8/6; patient presents for follow-up. We have been using antibiotic ointment with Hydrofera Blue under 3 layer compression to the lower extremities bilaterally. Wounds are stable. He denies signs of infection. 8/15; patient presents for follow-up. We have been using collagen to the left lower extremity and PolyMem silver to the right lower extremity all under 3 layer compression. Wounds are slightly smaller today. Patient has been approved for Apligraf and he was agreeable to having this placed in office. 8/20; patient presents for follow-up. At last clinic visit we used Apligraf to the right lower extremity under 3 layer compression and collagen to the left lower extremity under 3 layer compression. Wounds are smaller. 8/27; Patient presents for follow-up. We have been using Apligraf to the right lower extremity wound and collagen to the left lower extremity wound under 3 layer compression. Overall wounds appear well-healing. He has no issues or complaints. Electronic Signature(s) Signed: 03/12/2023 12:58:11 PM By: Geralyn Corwin DO Entered By: Geralyn Corwin on 03/12/2023 08:39:13 -------------------------------------------------------------------------------- Physical Exam Details Patient Name: Date of Service: [name] 03/12/2023 9:30 A M Medical Record Number: [id number] Patient Account Number: [id number] Date of Birth/Sex: Treating RN: [birth year] (52 y.o. M) Primary Care Provider: Barbette Price Other Clinician: Referring Provider: Treating Provider/Extender: Marcha Dutton, Vishwanath Weeks in  Price: 6 Constitutional respirations regular, non-labored and within target range for patient.. Cardiovascular 2+ dorsalis pedis/posterior tibialis pulses. Psychiatric pleasant and cooperative. Notes Right lower extremity: T the posterior aspect there is an open wound with granulation tissue and nonviable tissue. Good edema control . No surrounding signs o of infection including increased warmth, erythema or purulent drainage. Left lower extremity: T the posterior aspect there is an open wound with devitalized o tissue and granulation tissue. No signs of surrounding soft tissue infection including increased warmth, erythema or purulent drainage. [name], [name] ([id number]) [id number] Page 4 of 10 Electronic Signature(s) Signed: 03/12/2023 12:58:11 PM By: Geralyn Corwin DO Entered By: Geralyn Corwin on 03/12/2023 08:40:44 -------------------------------------------------------------------------------- Physician Orders Details Patient Name: Date of Service: [name] [name], [name]. 03/12/2023 9:30  A M Medical Record Number: [id number] Patient Account Number: [id number] Date of Birth/Sex: Treating RN: 1971-01-14 (52 y.o. [name] Primary Care Provider: Barbette Price Other Clinician: Referring Provider: Treating Provider/Extender: [name]: 6 Verbal / Phone Orders: No Diagnosis Coding Follow-up Appointments ppointment in 1 week. - Dr. Mikey Bussing Room 9 Return A ppointment in 2 weeks. - Dr Mikey Bussing Rm 7or 9 Please ask front desk to schedule an appopintment Return A Return appointment in 3 weeks. - Dr Mikey Bussing Rm 7or 9 Please ask front desk to schedule an appopintment Return appointment in 1 month. - Dr Mikey Bussing Rm 7or 9 Please ask front desk to schedule an appopintment Anesthetic (In clinic) Topical Lidocaine 4% applied to wound bed Cellular or Tissue Based Products Wound #1 Right,Lateral Lower  Leg Cellular or Tissue Based Product Type: - Apligraf #1 applied 02/28/23 Apligraf #2 applied 03/05/2023 Apligraf #3 applied 03/12/2023 Other Cellular or Tissue Based Products Orders/Instructions: - Already done- Run insurance verification for skin substitute Bathing/ Shower/ Hygiene May shower with protection but do not get wound dressing(s) wet. Protect dressing(s) with water repellant cover (for example, large plastic bag) or a cast cover and may then take shower. - Please do not get the legs wet. Please use cast protectors when showering. Cast protectors can be purchased from [employer], Medical supply store. Cost ranges from $17-$37 Edema Control - Lymphedema / SCD / Other Bilateral Lower Extremities Lymphedema Pumps. Use Lymphedema pumps on leg(s) 2-3 times a day for 45-60 minutes. If wearing any wraps or hose, do not remove them. Continue exercising as instructed. - Keep using the Lymphadema Pumps.You may place the th Lyphadema pumps over the compression wraps. Elevate legs to the level of the heart or above for 30 minutes daily and/or when sitting for 3-4 times a day throughout the day. Avoid standing for long periods of time. Exercise regularly - As tolerated Off-Loading Other: - Keep legs elevated to heart level or above when sitting. You may place a pillow behind the calf for comfort, if so desired. Wound Price Wound #1 - Lower Leg Wound Laterality: Right, Lateral Cleanser: Soap and Water 1 x Per Week/30 Days Discharge Instructions: May shower and wash wound with dial antibacterial soap and water prior to dressing change. Cleanser: Vashe 5.8 (oz) 1 x Per Week/30 Days Discharge Instructions: Cleanse the wound with Vashe prior to applying a clean dressing using gauze sponges, not tissue or cotton balls. Peri-Wound Care: Sween Lotion (Moisturizing lotion) 1 x Per Week/30 Days Discharge Instructions: Apply moisturizing lotion as directed Secondary Dressing: Woven  Gauze Sponge, Non-Sterile 4x4 in 1 x Per Week/30 Days Discharge Instructions: Apply over primary dressing as directed. Compression Wrap: Urgo K2 Lite, (equivalent to a 3 layer) two layer compression system, regular 1 x Per Week/30 Days Discharge Instructions: Apply Urgo K2 Lite as directed (alternative to 3 layer compression). Compression Wrap: Stockinette 1 x Per Week/30 Days Wound #2 - Lower Leg Wound Laterality: Left, Posterior NYKEL, WIBBENMEYER ([id number]) [id number] Page 5 of 10 Cleanser: Soap and Water 1 x Per Week/30 Days Discharge Instructions: May shower and wash wound with dial antibacterial soap and water prior to dressing change. Cleanser: Vashe 5.8 (oz) 1 x Per Week/30 Days Discharge Instructions: Cleanse the wound with Vashe prior to applying a clean dressing using gauze sponges, not tissue or cotton balls. Peri-Wound Care: Sween Lotion (Moisturizing lotion) 1 x Per Week/30 Days Discharge Instructions: Apply moisturizing lotion as directed Prim Dressing: PolyMem Silver Non-Adhesive Dressing,  4.25x4.25 in 1 x Per Week/30 Days ary Discharge Instructions: Apply to wound bed as instructed Secondary Dressing: Woven Gauze Sponge, Non-Sterile 4x4 in 1 x Per Week/30 Days Discharge Instructions: Apply over primary dressing as directed. Compression Wrap: Urgo K2 Lite, (equivalent to a 3 layer) two layer compression system, regular 1 x Per Week/30 Days Discharge Instructions: Apply Urgo K2 Lite as directed (alternative to 3 layer compression). Compression Wrap: Stockinette 1 x Per Week/30 Days Electronic Signature(s) Signed: 03/12/2023 12:58:11 PM By: Geralyn Corwin DO Entered By: Geralyn Corwin on 03/12/2023 08:41:53 -------------------------------------------------------------------------------- Problem List Details Patient Name: Date of Service: [name] [name] Price. 03/12/2023 9:30 A M Medical Record Number: [id number] Patient Account Number:  [id number] Date of Birth/Sex: Treating RN: [birth year] (52 y.o. M) Primary Care Provider: Barbette Price Other Clinician: Referring Provider: Treating Provider/Extender: [name]: 6 Active Problems ICD-10 Encounter Code Description Active Date MDM Diagnosis L97.812 Non-pressure chronic ulcer of other part of right lower leg with fat layer 01/25/2023 No Yes exposed L97.822 Non-pressure chronic ulcer of other part of left lower leg with fat layer exposed7/06/2023 No Yes E11.622 Type 2 diabetes mellitus with other skin ulcer 01/25/2023 No Yes I87.313 Chronic venous hypertension (idiopathic) with ulcer of bilateral lower extremity 01/25/2023 No Yes Inactive Problems Resolved Problems Electronic Signature(s) Signed: 03/12/2023 12:58:11 PM By: Geralyn Corwin DO Entered By: Geralyn Corwin on 03/12/2023 08:36:12 [name] ([id number]) [id number] Page 6 of 10 -------------------------------------------------------------------------------- Progress Note Details Patient Name: Date of Service: [name]. 03/12/2023 9:30 A M Medical Record Number: [id number] Patient Account Number: [id number] Date of Birth/Sex: Treating RN: August 20, 1970 (52 y.o. M) Primary Care Provider: Barbette Price Other Clinician: Referring Provider: Treating Provider/Extender: [name]: 6 Subjective Chief Complaint Information obtained from Patient 01/25/2023; bilateral lower extremity wounds History of Present Illness (HPI) 01/25/2023 [name] is a 52 year old male with a past medical history of controlled type 2 diabetes on insulin, venous insufficiency and DVT that presents to the clinic for bilateral lower extremity wounds. He states that the wound on the left was started by a bug bite and has been present for the past 4 months. The right lower extremity wound he  states started out as a soft tissue infection and has been present for the past 6 years. He has been treated by a wound care center in [place] [place]. He was last seen in the wound care center 2 years ago Could not continue following up due to lack of transportation. He states that recently the rehab center has been wrapping his legs with compression wrap and a wound dressing underneath. It is unclear when the last time this happened was. He is not wearing any compression today. He currently denies signs of infection. 7/19; patient presents for follow-up. We have been using antibiotic ointment with Hydrofera Blue under 3 layer compression to the lower extremities bilaterally. Wounds are smaller. 7/30; patient presents for follow-up. We have been using antibiotic ointment and Hydrofera Blue under 3 layer compression to the lower extremities bilaterally. Wounds are smaller. 8/6; patient presents for follow-up. We have been using antibiotic ointment with Hydrofera Blue under 3 layer compression to the lower extremities bilaterally. Wounds are stable. He denies signs of infection. 8/15; patient presents for follow-up. We have been using collagen to the left lower extremity and PolyMem silver to the right lower extremity all under 3 layer compression. Wounds are slightly smaller today.  Patient has been approved for Apligraf and he was agreeable to having this placed in office. 8/20; patient presents for follow-up. At last clinic visit we used Apligraf to the right lower extremity under 3 layer compression and collagen to the left lower extremity under 3 layer compression. Wounds are smaller. 8/27; Patient presents for follow-up. We have been using Apligraf to the right lower extremity wound and collagen to the left lower extremity wound under 3 layer compression. Overall wounds appear well-healing. He has no issues or complaints. Patient History Information obtained from  Patient. Family History Heart Disease, Hypertension - Mother,Siblings, Kidney Disease - Father,Siblings, Stroke - Mother, No family history of Diabetes. Social History Never smoker, Marital Status - Single, Alcohol Use - Never, Drug Use - No History, Caffeine Use - Moderate - soda,coffee. Medical History Hematologic/Lymphatic Patient has history of Anemia Cardiovascular Patient has history of Deep Vein Thrombosis - both leg, Hypertension, Peripheral Venous Disease Endocrine Patient has history of Type II Diabetes - HgbA1c 6.2 (5/24) Musculoskeletal Patient has history of Gout Hospitalization/Surgery History - scrotal surgery. - right elbow surgery. - right elbow septic arthritis IandD x2 with skin graft. - hernia repair 2022. - 2018 Ecoli sepsis. - necrotizing fasciitis. Medical A Surgical History Notes nd Constitutional Symptoms (General Health) CVA Cardiovascular cellulitis right leg 2018 Genitourinary CKD stage 2 [name], [name] ([id number]) [id number] Page 7 of 10 Objective Constitutional respirations regular, non-labored and within target range for patient.. Vitals Time Taken: 9:39 AM, Height: 70 in, Weight: 264 lbs, BMI: 37.9, Temperature: 98.1 F, Pulse: 65 bpm, Respiratory Rate: 18 breaths/min, Blood Pressure: 129/78 mmHg. Cardiovascular 2+ dorsalis pedis/posterior tibialis pulses. Psychiatric pleasant and cooperative. General Notes: Right lower extremity: T the posterior aspect there is an open wound with granulation tissue and nonviable tissue. Good edema control . No o surrounding signs of infection including increased warmth, erythema or purulent drainage. Left lower extremity: T the posterior aspect there is an open wound o with devitalized tissue and granulation tissue. No signs of surrounding soft tissue infection including increased warmth, erythema or purulent drainage. Integumentary (Hair, Skin) Wound #1 status is Open.  Original cause of wound was Other Lesion. The date acquired was: 01/01/2018. The wound has been in Price 6 weeks. The wound is located on the Right,Lateral Lower Leg. The wound measures 4.2cm length x 1.6cm width x 0.2cm depth; 5.278cm^2 area and 1.056cm^3 volume. There is Fat Layer (Subcutaneous Tissue) exposed. There is no tunneling noted, however, there is undermining starting at 10:00 and ending at 3:00 with a maximum distance of 0.3cm. There is a medium amount of serosanguineous drainage noted. The wound margin is thickened. There is large (67-100%) red, pink granulation within the wound bed. There is a small (1-33%) amount of necrotic tissue within the wound bed including Eschar. The periwound skin appearance exhibited: Hemosiderin Staining. The periwound skin appearance did not exhibit: Callus, Crepitus, Excoriation, Induration, Rash, Scarring, Dry/Scaly, Maceration, Atrophie Blanche, Cyanosis, Ecchymosis, Mottled, Pallor, Rubor, Erythema. Periwound temperature was noted as No Abnormality. Wound #2 status is Open. Original cause of wound was Bite. The date acquired was: 09/14/2022. The wound has been in Price 6 weeks. The wound is located on the Left,Posterior Lower Leg. The wound measures 0.2cm length x 0.2cm width x 0.1cm depth; 0.031cm^2 area and 0.003cm^3 volume. There is a none present amount of drainage noted. The wound margin is distinct with the outline attached to the wound base. There is no granulation within the wound bed. There is a large (67-100%)  amount of necrotic tissue within the wound bed including Eschar. The periwound skin appearance exhibited: Hemosiderin Staining. The periwound skin appearance did not exhibit: Callus, Crepitus, Excoriation, Induration, Rash, Scarring, Dry/Scaly, Maceration, Atrophie Blanche, Cyanosis, Ecchymosis, Mottled, Pallor, Rubor, Erythema. Periwound temperature was noted as No Abnormality. Assessment Active Problems ICD-10 Non-pressure chronic  ulcer of other part of right lower leg with fat layer exposed Non-pressure chronic ulcer of other part of left lower leg with fat layer exposed Type 2 diabetes mellitus with other skin ulcer Chronic venous hypertension (idiopathic) with ulcer of bilateral lower extremity Patient's wounds appear well-healing. I debrided nonviable tissue. I recommended switching the dressing on the left lower extremity to PolyMem silver as some of the devitalized tissue sloughed off creating a larger wound. Continue compression wrap here. T the right lower extremity Apligraf was placed in o standard fashion. Also continued compression wrap here. Follow-up in 1 week. Procedures Wound #1 Pre-procedure diagnosis of Wound #1 is a Diabetic Wound/Ulcer of the Lower Extremity located on the Right,Lateral Lower Leg .Severity of Tissue Pre Debridement is: Fat layer exposed. There was a Excisional Skin/Subcutaneous Tissue Debridement with a total area of 5.28 sq cm performed by Geralyn Corwin, DO. With the following instrument(s): Curette to remove Viable and Non-Viable tissue/material. Material removed includes Subcutaneous Tissue and Slough and. No specimens were taken. A time out was conducted at 09:55, prior to the start of the procedure. A Minimum amount of bleeding was controlled with Pressure. The procedure was tolerated well. Post Debridement Measurements: 4.2cm length x 1.6cm width x 0.2cm depth; 1.056cm^3 volume. Character of Wound/Ulcer Post Debridement requires further debridement. Severity of Tissue Post Debridement is: Fat layer exposed. Post procedure Diagnosis Wound #1: Same as Pre-Procedure General Notes: Scribed for Dr. Mikey Bussing by Tommie Ard, RN. Pre-procedure diagnosis of Wound #1 is a Diabetic Wound/Ulcer of the Lower Extremity located on the Right,Lateral Lower Leg. A skin graft procedure using a bioengineered skin substitute/cellular or tissue based product was performed by Geralyn Corwin, DO with  the following instrument(s): Blade. Apligraf was applied and secured. 18 sq cm of product was utilized and 26 sq cm was wasted due to size of wound. Post Application, Adaptec was applied. A Time Out was conducted at 09:58, prior to the start of the procedure. The procedure was tolerated well with a pain level of 0 throughout and a pain level of 0 following the procedure. Post procedure Diagnosis Wound #1: Same as Pre-Procedure General Notes: Scribed for Dr. Mikey Bussing by Tommie Ard, RN. Pre-procedure diagnosis of Wound #1 is a Diabetic Wound/Ulcer of the Lower Extremity located on the Right,Lateral Lower Leg . There was a Three Layer Compression Therapy Procedure by Tommie Ard, RN. Post procedure Diagnosis Wound #1: Same as Pre-Procedure Notes: Urgo lite. [name], [name] ([id number]) [id number] Page 8 of 10 Wound #2 Pre-procedure diagnosis of Wound #2 is a Diabetic Wound/Ulcer of the Lower Extremity located on the Left,Posterior Lower Leg . There was a Three Layer Compression Therapy Procedure by Tommie Ard, RN. Post procedure Diagnosis Wound #2: Same as Pre-Procedure Notes: Urgo lite. Plan Follow-up Appointments: Return Appointment in 1 week. - Dr. Mikey Bussing Room 9 Return Appointment in 2 weeks. - Dr Mikey Bussing Rm 7or 9 Please ask front desk to schedule an appopintment Return appointment in 3 weeks. - Dr Mikey Bussing Rm 7or 9 Please ask front desk to schedule an appopintment Return appointment in 1 month. - Dr Mikey Bussing Rm 7or 9 Please ask front desk to schedule an appopintment Anesthetic: (In  clinic) Topical Lidocaine 4% applied to wound bed Cellular or Tissue Based Products: Wound #1 Right,Lateral Lower Leg: Cellular or Tissue Based Product Type: - Apligraf #1 applied 02/28/23 Apligraf #2 applied 03/05/2023 Apligraf #3 applied 03/12/2023 Other Cellular or Tissue Based Products Orders/Instructions: - Already done- Run insurance verification for skin  substitute Bathing/ Shower/ Hygiene: May shower with protection but do not get wound dressing(s) wet. Protect dressing(s) with water repellant cover (for example, large plastic bag) or a cast cover and may then take shower. - Please do not get the legs wet. Please use cast protectors when showering. Cast protectors can be purchased from [employer], Medical supply store. Cost ranges from $17-$37 Edema Control - Lymphedema / SCD / Other: Lymphedema Pumps. Use Lymphedema pumps on leg(s) 2-3 times a day for 45-60 minutes. If wearing any wraps or hose, do not remove them. Continue exercising as instructed. - Keep using the Lymphadema Pumps.You may place the th Lyphadema pumps over the compression wraps. Elevate legs to the level of the heart or above for 30 minutes daily and/or when sitting for 3-4 times a day throughout the day. Avoid standing for long periods of time. Exercise regularly - As tolerated Off-Loading: Other: - Keep legs elevated to heart level or above when sitting. You may place a pillow behind the calf for comfort, if so desired. WOUND #1: - Lower Leg Wound Laterality: Right, Lateral Cleanser: Soap and Water 1 x Per Week/30 Days Discharge Instructions: May shower and wash wound with dial antibacterial soap and water prior to dressing change. Cleanser: Vashe 5.8 (oz) 1 x Per Week/30 Days Discharge Instructions: Cleanse the wound with Vashe prior to applying a clean dressing using gauze sponges, not tissue or cotton balls. Peri-Wound Care: Sween Lotion (Moisturizing lotion) 1 x Per Week/30 Days Discharge Instructions: Apply moisturizing lotion as directed Secondary Dressing: Woven Gauze Sponge, Non-Sterile 4x4 in 1 x Per Week/30 Days Discharge Instructions: Apply over primary dressing as directed. Com pression Wrap: Urgo K2 Lite, (equivalent to a 3 layer) two layer compression system, regular 1 x Per Week/30 Days Discharge Instructions: Apply Urgo K2 Lite as  directed (alternative to 3 layer compression). Com pression Wrap: Stockinette 1 x Per Week/30 Days WOUND #2: - Lower Leg Wound Laterality: Left, Posterior Cleanser: Soap and Water 1 x Per Week/30 Days Discharge Instructions: May shower and wash wound with dial antibacterial soap and water prior to dressing change. Cleanser: Vashe 5.8 (oz) 1 x Per Week/30 Days Discharge Instructions: Cleanse the wound with Vashe prior to applying a clean dressing using gauze sponges, not tissue or cotton balls. Peri-Wound Care: Sween Lotion (Moisturizing lotion) 1 x Per Week/30 Days Discharge Instructions: Apply moisturizing lotion as directed Prim Dressing: PolyMem Silver Non-Adhesive Dressing, 4.25x4.25 in 1 x Per Week/30 Days ary Discharge Instructions: Apply to wound bed as instructed Secondary Dressing: Woven Gauze Sponge, Non-Sterile 4x4 in 1 x Per Week/30 Days Discharge Instructions: Apply over primary dressing as directed. Com pression Wrap: Urgo K2 Lite, (equivalent to a 3 layer) two layer compression system, regular 1 x Per Week/30 Days Discharge Instructions: Apply Urgo K2 Lite as directed (alternative to 3 layer compression). Com pression Wrap: Stockinette 1 x Per Week/30 Days 1. In office sharp debridement 2. PolyMem silver under 3 layer equivalent to the left lower extremity 3. Apligraf placed in standard fashion to the left lower extremity 4. Continue 3 layer Coban to the right lower extremity 5. Follow-up in 1 week Electronic Signature(s) Signed: 03/12/2023 12:58:11 PM By: Geralyn Corwin  DO Entered By: Geralyn Corwin on 03/12/2023 08:53:44 HxROS Details -------------------------------------------------------------------------------- [name] ([id number]) [id number] Page 9 of 10 Patient Name: Date of Service: [name] 03/12/2023 9:30 A M Medical Record Number: [id number] Patient Account Number: [id number] Date of Birth/Sex: Treating  RN: 1971-06-03 (52 y.o. M) Primary Care Provider: Barbette Price Other Clinician: Referring Provider: Treating Provider/Extender: [name]: 6 Information Obtained From Patient Constitutional Symptoms (General Health) Medical History: Past Medical History Notes: CVA Hematologic/Lymphatic Medical History: Positive for: Anemia Cardiovascular Medical History: Positive for: Deep Vein Thrombosis - both leg; Hypertension; Peripheral Venous Disease Past Medical History Notes: cellulitis right leg 2018 Endocrine Medical History: Positive for: Type II Diabetes - HgbA1c 6.2 (5/24) Time with diabetes: 9 years Treated with: Insulin Blood sugar tested every day: Yes Tested : 3 x day Blood sugar testing results: Breakfast: 97 Genitourinary Medical History: Past Medical History Notes: CKD stage 2 Musculoskeletal Medical History: Positive for: Gout Immunizations Pneumococcal Vaccine: Received Pneumococcal Vaccination: No Implantable Devices None Hospitalization / Surgery History Type of Hospitalization/Surgery scrotal surgery right elbow surgery right elbow septic arthritis IandD x2 with skin graft hernia repair 2022 2018 Ecoli sepsis necrotizing fasciitis Family and Social History Diabetes: No; Heart Disease: Yes; Hypertension: Yes - Mother,Siblings; Kidney Disease: Yes - Father,Siblings; Stroke: Yes - Mother; Never smoker; Marital Status - Single; Alcohol Use: Never; Drug Use: No History; Caffeine Use: Moderate - soda,coffee; Financial Concerns: No; Food, Clothing or Shelter Needs: No; Support System Lacking: No; Transportation Concerns: No Electronic Signature(s) Signed: 03/12/2023 12:58:11 PM By: Geralyn Corwin DO Entered By: Geralyn Corwin on 03/12/2023 08:39:34 [name] ([id number]) [id number] Page 10 of  10 -------------------------------------------------------------------------------- SuperBill Details Patient Name: Date of Service: [name] 03/12/2023 Medical Record Number: [id number] Patient Account Number: [id number] Date of Birth/Sex: Treating RN: 10-21-1970 (52 y.o. M) Primary Care Provider: Barbette Price Other Clinician: Referring Provider: Treating Provider/Extender: [name]: 6 Diagnosis Coding ICD-10 Codes Code Description [phone] Non-pressure chronic ulcer of other part of right lower leg with fat layer exposed L97.822 Non-pressure chronic ulcer of other part of left lower leg with fat layer exposed E11.622 Type 2 diabetes mellitus with other skin ulcer I87.313 Chronic venous hypertension (idiopathic) with ulcer of bilateral lower extremity Facility Procedures : CPT4 Code: 43329518 Description: (Facility Use Only) Apligraf 1 SQ CM Modifier: Quantity: 44 : CPT4 Code: 84166063 Description: 15271 - SKIN SUB GRAFT TRNK/ARM/LEG ICD-10 Diagnosis Description L97.812 Non-pressure chronic ulcer of other part of right lower leg with fat layer expos E11.622 Type 2 diabetes mellitus with other skin ulcer I87.313 Chronic venous  hypertension (idiopathic) with ulcer of bilateral lower extremity Modifier: ed Quantity: 1 Physician Procedures : CPT4 Code Description Modifier [id number] - WC PHYS LEVEL 3 - EST PT 25 ICD-10 Diagnosis Description L97.822 Non-pressure chronic ulcer of other part of left lower leg with fat layer exposed E11.622 Type 2 diabetes mellitus with other skin ulcer  I87.313 Chronic venous hypertension (idiopathic) with ulcer of bilateral lower extremity Quantity: 1 : [id number] 15271 - WC PHYS SKIN SUB GRAFT TRNK/ARM/LEG ICD-10 Diagnosis Description L97.812 Non-pressure chronic ulcer of other part of right lower leg with fat layer exposed E11.622 Type 2 diabetes mellitus with other skin ulcer I87.313  Chronic venous  hypertension (idiopathic) with ulcer of bilateral lower extremity Quantity: 1 Electronic Signature(s) Signed: 03/12/2023 12:58:11 PM By: Geralyn Corwin DO Entered By: Geralyn Corwin on 03/12/2023 08:54:28

## 2023-03-14 ENCOUNTER — Other Ambulatory Visit: Payer: Self-pay | Admitting: Internal Medicine

## 2023-03-14 ENCOUNTER — Ambulatory Visit
Admission: RE | Admit: 2023-03-14 | Discharge: 2023-03-14 | Disposition: A | Discharge status: Home / Self Care | Payer: Medicaid Other | Source: Ambulatory Visit | Attending: Internal Medicine | Admitting: Internal Medicine

## 2023-03-14 DIAGNOSIS — E1159 Type 2 diabetes mellitus with other circulatory complications: Secondary | ICD-10-CM | POA: Diagnosis not present

## 2023-03-14 DIAGNOSIS — R809 Proteinuria, unspecified: Secondary | ICD-10-CM | POA: Diagnosis not present

## 2023-03-14 DIAGNOSIS — R519 Headache, unspecified: Secondary | ICD-10-CM

## 2023-03-14 DIAGNOSIS — I152 Hypertension secondary to endocrine disorders: Secondary | ICD-10-CM | POA: Diagnosis not present

## 2023-03-14 DIAGNOSIS — L97909 Non-pressure chronic ulcer of unspecified part of unspecified lower leg with unspecified severity: Secondary | ICD-10-CM | POA: Diagnosis not present

## 2023-03-14 DIAGNOSIS — N182 Chronic kidney disease, stage 2 (mild): Secondary | ICD-10-CM | POA: Diagnosis not present

## 2023-03-14 DIAGNOSIS — E1129 Type 2 diabetes mellitus with other diabetic kidney complication: Secondary | ICD-10-CM | POA: Diagnosis not present

## 2023-03-14 DIAGNOSIS — Z794 Long term (current) use of insulin: Secondary | ICD-10-CM | POA: Diagnosis not present

## 2023-03-14 DIAGNOSIS — E11622 Type 2 diabetes mellitus with other skin ulcer: Secondary | ICD-10-CM | POA: Diagnosis not present

## 2023-03-14 DIAGNOSIS — Z6837 Body mass index (BMI) 37.0-37.9, adult: Secondary | ICD-10-CM | POA: Diagnosis not present

## 2023-03-14 DIAGNOSIS — I6782 Cerebral ischemia: Secondary | ICD-10-CM | POA: Diagnosis not present

## 2023-03-19 ENCOUNTER — Encounter (HOSPITAL_BASED_OUTPATIENT_CLINIC_OR_DEPARTMENT_OTHER): Payer: Medicaid Other | Attending: General Surgery | Admitting: Internal Medicine

## 2023-03-19 DIAGNOSIS — Z86718 Personal history of other venous thrombosis and embolism: Secondary | ICD-10-CM | POA: Diagnosis not present

## 2023-03-19 DIAGNOSIS — L97822 Non-pressure chronic ulcer of other part of left lower leg with fat layer exposed: Secondary | ICD-10-CM | POA: Diagnosis not present

## 2023-03-19 DIAGNOSIS — E11622 Type 2 diabetes mellitus with other skin ulcer: Secondary | ICD-10-CM

## 2023-03-19 DIAGNOSIS — N182 Chronic kidney disease, stage 2 (mild): Secondary | ICD-10-CM | POA: Insufficient documentation

## 2023-03-19 DIAGNOSIS — E1122 Type 2 diabetes mellitus with diabetic chronic kidney disease: Secondary | ICD-10-CM | POA: Diagnosis not present

## 2023-03-19 DIAGNOSIS — I129 Hypertensive chronic kidney disease with stage 1 through stage 4 chronic kidney disease, or unspecified chronic kidney disease: Secondary | ICD-10-CM | POA: Diagnosis not present

## 2023-03-19 DIAGNOSIS — I87313 Chronic venous hypertension (idiopathic) with ulcer of bilateral lower extremity: Secondary | ICD-10-CM | POA: Diagnosis not present

## 2023-03-19 DIAGNOSIS — L97812 Non-pressure chronic ulcer of other part of right lower leg with fat layer exposed: Secondary | ICD-10-CM | POA: Diagnosis not present

## 2023-03-26 ENCOUNTER — Encounter (HOSPITAL_BASED_OUTPATIENT_CLINIC_OR_DEPARTMENT_OTHER): Payer: Medicaid Other | Admitting: Internal Medicine

## 2023-03-26 DIAGNOSIS — L97812 Non-pressure chronic ulcer of other part of right lower leg with fat layer exposed: Secondary | ICD-10-CM

## 2023-03-26 DIAGNOSIS — N182 Chronic kidney disease, stage 2 (mild): Secondary | ICD-10-CM | POA: Diagnosis not present

## 2023-03-26 DIAGNOSIS — Z86718 Personal history of other venous thrombosis and embolism: Secondary | ICD-10-CM | POA: Diagnosis not present

## 2023-03-26 DIAGNOSIS — I87313 Chronic venous hypertension (idiopathic) with ulcer of bilateral lower extremity: Secondary | ICD-10-CM

## 2023-03-26 DIAGNOSIS — L97822 Non-pressure chronic ulcer of other part of left lower leg with fat layer exposed: Secondary | ICD-10-CM | POA: Diagnosis not present

## 2023-03-26 DIAGNOSIS — E11622 Type 2 diabetes mellitus with other skin ulcer: Secondary | ICD-10-CM | POA: Diagnosis not present

## 2023-03-26 DIAGNOSIS — E1122 Type 2 diabetes mellitus with diabetic chronic kidney disease: Secondary | ICD-10-CM | POA: Diagnosis not present

## 2023-03-26 DIAGNOSIS — I129 Hypertensive chronic kidney disease with stage 1 through stage 4 chronic kidney disease, or unspecified chronic kidney disease: Secondary | ICD-10-CM | POA: Diagnosis not present

## 2023-03-26 NOTE — Progress Notes (Signed)
[name], MORGANTE ([id number]) [id number] Page 1 of 9 Visit Report for 03/26/2023 Arrival Information Details Patient Name: Date of Service: [name]. 03/26/2023 9:30 A M Medical Record Number: [id number] Patient Account Number: [id number] Date of Birth/Sex: Treating RN: 1971-01-17 (52 y.o. [name] Primary Care Seab Axel: Barbette Reichmann Other Clinician: Referring [name]: Treating Annalyssa Thune/Extender: Adolph Pollack Weeks in Treatment: 8 Visit Information History Since Last Visit All ordered tests and consults were completed: Yes Patient Arrived: Ambulatory Added or deleted any medications: No Arrival Time: 09:43 Any new allergies or adverse reactions: No Accompanied By: self Had a fall or experienced change in No Transfer Assistance: None activities of daily living that may affect Patient Requires Transmission-Based Precautions: No risk of falls: Patient Has Alerts: No Signs or symptoms of abuse/neglect since last visito No Hospitalized since last visit: No Implantable device outside of the clinic excluding No cellular tissue based products placed in the center since last visit: Has Dressing in Place as Prescribed: Yes Pain Present Now: No Electronic Signature(s) Signed: 03/26/2023 3:48:27 PM By: Brenton Grills Entered By: Brenton Grills on 03/26/2023 06:43:58 -------------------------------------------------------------------------------- Compression Therapy Details Patient Name: Date of Service: [name]. 03/26/2023 9:30 A M Medical Record Number: [id number] Patient Account Number: [id number] Date of Birth/Sex: Treating RN: January 27, 1971 (52 y.o. [name] Primary Care [name]: Barbette Reichmann Other Clinician: Referring Lashunta Frieden: Treating Joetta Delprado/Extender: Adolph Pollack Weeks in Treatment: 8 Compression Therapy Performed for Wound Assessment: Wound #2  Left,Posterior Lower Leg Performed By: Clinician Brenton Grills, RN Compression Type: Three Layer Post Procedure Diagnosis Same as Pre-procedure Electronic Signature(s) Signed: 03/26/2023 3:48:27 PM By: Brenton Grills Entered By: Brenton Grills on 03/26/2023 07:09:38 [name] ([id number]) [id number] Page 2 of 9 -------------------------------------------------------------------------------- Compression Therapy Details Patient Name: Date of Service: [name]. 03/26/2023 9:30 A M Medical Record Number: [id number] Patient Account Number: [id number] Date of Birth/Sex: Treating RN: 07-Nov-1970 (52 y.o. [name] Primary Care Delfin Squillace: Barbette Reichmann Other Clinician: Referring Gurkirat Basher: Treating Shine Scrogham/Extender: Adolph Pollack Weeks in Treatment: 8 Compression Therapy Performed for Wound Assessment: Wound #1 Right,Lateral Lower Leg Performed By: Clinician Brenton Grills, RN Compression Type: Three Layer Post Procedure Diagnosis Same as Pre-procedure Electronic Signature(s) Signed: 03/26/2023 3:48:27 PM By: Brenton Grills Entered By: Brenton Grills on 03/26/2023 07:09:38 -------------------------------------------------------------------------------- Encounter Discharge Information Details Patient Name: Date of Service: [name]. 03/26/2023 9:30 A M Medical Record Number: [id number] Patient Account Number: [id number] Date of Birth/Sex: Treating RN: 1970-08-07 (52 y.o. [name] Primary Care Jyquan Kenley: Barbette Reichmann Other Clinician: Referring Atlantis Delong: Treating Tayson Schnelle/Extender: Adolph Pollack Weeks in Treatment: 8 Encounter Discharge Information Items Post Procedure Vitals Discharge Condition: Stable Temperature (F): 98.6 Ambulatory Status: Ambulatory Pulse (bpm): 80 Discharge Destination: Home Respiratory Rate (breaths/min): 18 Transportation: Private  Auto Blood Pressure (mmHg): 132/78 Accompanied By: self Schedule Follow-up Appointment: Yes Clinical Summary of Care: Patient Declined Electronic Signature(s) Signed: 03/26/2023 3:48:27 PM By: Brenton Grills Entered By: Brenton Grills on 03/26/2023 07:28:52 -------------------------------------------------------------------------------- Lower Extremity Assessment Details Patient Name: Date of Service: [name]. 03/26/2023 9:30 A M Medical Record Number: [id number] Patient Account Number: [id number] Date of Birth/Sex: Treating RN: April 25, 1971 (52 y.o. [name] Primary Care Maxamilian Amadon: Barbette Reichmann Other Clinician: Referring Maevis Mumby: Treating Lenny Fiumara/Extender: Adolph Pollack Weeks in Treatment: 8 Edema Assessment Assessed: [Left: No] [Right: No] Edema: [Left: No] [Right: No] [name], [name] ([id number]) [id number] Page 3 of  9 Left: Right: Point of Measurement: 34 cm From Medial Instep 34.5 cm 33.4 cm Ankle Left: Right: Point of Measurement: 10 cm From Medial Instep 21.4 cm 21.3 cm Vascular Assessment Pulses: Dorsalis Pedis Palpable: [Left:Yes] [Right:Yes] Extremity colors, hair growth, and conditions: Extremity Color: [Left:Normal] [Right:Normal] Hair Growth on Extremity: [Left:No] [Right:No] Temperature of Extremity: [Left:Warm] [Right:Warm] Capillary Refill: [Left:< 3 seconds] [Right:< 3 seconds] Dependent Rubor: [Left:No No] [Right:No No] Toe Nail Assessment Left: Right: Thick: Yes Yes Discolored: Yes Yes Deformed: Yes Yes Improper Length and Hygiene: Yes Yes Electronic Signature(s) Signed: 03/26/2023 3:48:27 PM By: Brenton Grills Entered By: Brenton Grills on 03/26/2023 06:44:50 -------------------------------------------------------------------------------- Multi Wound Chart Details Patient Name: Date of Service: [name]. 03/26/2023 9:30 A M Medical Record Number:  [id number] Patient Account Number: [id number] Date of Birth/Sex: Treating RN: 1971/02/28 (52 y.o. M) Primary Care Jaelle Campanile: Barbette Reichmann Other Clinician: Referring Juanya Villavicencio: Treating Torri Langston/Extender: Marcha Dutton, Vishwanath Weeks in Treatment: 8 Vital Signs Height(in): 70 Pulse(bpm): 58 Weight(lbs): 264 Blood Pressure(mmHg): 111/74 Body Mass Index(BMI): 37.9 Temperature(F): 98.2 Respiratory Rate(breaths/min): 18 [1:Photos:] [N/A:N/A] Right, Lateral Lower Leg Left, Posterior Lower Leg N/A Wound Location: Other Lesion Bite N/A Wounding Event: Diabetic Wound/Ulcer of the Lower Diabetic Wound/Ulcer of the Lower N/A Primary Etiology: Extremity Extremity Anemia, Deep Vein Thrombosis, Anemia, Deep Vein Thrombosis, N/A Comorbid History: Hypertension, Peripheral Venous Hypertension, Peripheral Venous Disease, Type II Diabetes, Gout Disease, Type II Diabetes, Gout 01/01/2018 09/14/2022 N/A Date Acquired: [name], [name] ([id number]) [id number] Page 4 of 9 8 8  N/A Weeks of Treatment: Open Open N/A Wound Status: No No N/A Wound Recurrence: 4.6x1.3x0.2 1x1.2x0.1 N/A Measurements L x W x D (cm) 4.697 0.942 N/A A (cm) : rea 0.939 0.094 N/A Volume (cm) : 64.10% 71.40% N/A % Reduction in A rea: 82.00% 71.50% N/A % Reduction in Volume: Grade 2 Grade 1 N/A Classification: Medium None Present N/A Exudate A mount: Serosanguineous N/A N/A Exudate Type: red, brown N/A N/A Exudate Color: Thickened Distinct, outline attached N/A Wound Margin: Large (67-100%) None Present (0%) N/A Granulation A mount: Red, Pink N/A N/A Granulation Quality: Small (1-33%) Large (67-100%) N/A Necrotic A mount: Eschar Eschar N/A Necrotic Tissue: Fat Layer (Subcutaneous Tissue): Yes Fascia: No N/A Exposed Structures: Fascia: No Fat Layer (Subcutaneous Tissue): No Tendon: No Tendon: No Muscle: No Muscle: No Joint: No Joint: No Bone: No Bone:  No Small (1-33%) Large (67-100%) N/A Epithelialization: Debridement - Excisional N/A N/A Debridement: Pre-procedure Verification/Time Out 10:00 N/A N/A Taken: Lidocaine 4% Topical Solution N/A N/A Pain Control: Subcutaneous, Slough N/A N/A Tissue Debrided: Skin/Subcutaneous Tissue N/A N/A Level: 4.69 N/A N/A Debridement A (sq cm): rea Curette N/A N/A Instrument: Minimum N/A N/A Bleeding: Pressure N/A N/A Hemostasis A chieved: Procedure was tolerated well N/A N/A Debridement Treatment Response: 4.6x1.3x0.2 N/A N/A Post Debridement Measurements L x W x D (cm) 0.939 N/A N/A Post Debridement Volume: (cm) Excoriation: No Excoriation: No N/A Periwound Skin Texture: Induration: No Induration: No Callus: No Callus: No Crepitus: No Crepitus: No Rash: No Rash: No Scarring: No Scarring: No Maceration: No Maceration: No N/A Periwound Skin Moisture: Dry/Scaly: No Dry/Scaly: No Hemosiderin Staining: Yes Hemosiderin Staining: Yes N/A Periwound Skin Color: Atrophie Blanche: No Atrophie Blanche: No Cyanosis: No Cyanosis: No Ecchymosis: No Ecchymosis: No Erythema: No Erythema: No Mottled: No Mottled: No Pallor: No Pallor: No Rubor: No Rubor: No No Abnormality No Abnormality N/A Temperature: Cellular or Tissue Based Product Compression Therapy N/A Procedures Performed: Compression Therapy Debridement Treatment Notes Electronic Signature(s) Signed: 03/26/2023  1:53:07 PM By: Geralyn Corwin DO Entered By: Geralyn Corwin on 03/26/2023 07:10:41 -------------------------------------------------------------------------------- Multi-Disciplinary Care Plan Details Patient Name: Date of Service: [name]. 03/26/2023 9:30 A M Medical Record Number: [id number] Patient Account Number: [id number] Date of Birth/Sex: Treating RN: 1971-02-09 (52 y.o. [name] Primary Care Mong Neal: Barbette Reichmann Other Clinician: Referring Manasvini Whatley: Treating  Daylyn Azbill/Extender: Adolph Pollack Weeks in Treatment: 8 [name], [name] ([id number]) [id number] Page 5 of 9 Active Inactive Wound/Skin Impairment Nursing Diagnoses: Impaired tissue integrity Goals: Patient/caregiver will verbalize understanding of skin care regimen Date Initiated: 01/25/2023 Target Resolution Date: 05/16/2023 Goal Status: Active Interventions: Assess ulceration(s) every visit Treatment Activities: Skin care regimen initiated : 01/25/2023 Notes: Electronic Signature(s) Signed: 03/26/2023 3:48:27 PM By: Brenton Grills Entered By: Brenton Grills on 03/26/2023 06:56:02 -------------------------------------------------------------------------------- Pain Assessment Details Patient Name: Date of Service: [name]. 03/26/2023 9:30 A M Medical Record Number: [id number] Patient Account Number: [id number] Date of Birth/Sex: Treating RN: [birth year] (52 y.o. [name] Primary Care Brecklynn Jian: Barbette Reichmann Other Clinician: Referring Merrisa Skorupski: Treating Sunjai Levandoski/Extender: Adolph Pollack Weeks in Treatment: 8 Active Problems Location of Pain Severity and Description of Pain Patient Has Paino No Site Locations Pain Management and Medication Current Pain Management: Electronic Signature(s) Signed: 03/26/2023 3:48:27 PM By: Brenton Grills Entered By: Brenton Grills on 03/26/2023 06:44:33 [name] ([id number]) [id number] Page 6 of 9 -------------------------------------------------------------------------------- Patient/Caregiver Education Details Patient Name: Date of Service: [name] 9/10/2024andnbsp9:30 A M Medical Record Number: [id number] Patient Account Number: [id number] Date of Birth/Gender: Treating RN: September 10, 1970 (52 y.o. [name] Primary Care Physician: Barbette Reichmann Other Clinician: Referring  Physician: Treating Physician/Extender: Adolph Pollack Weeks in Treatment: 8 Education Assessment Education Provided To: Patient Education Topics Provided Wound/Skin Impairment: Methods: Explain/Verbal Responses: State content correctly Electronic Signature(s) Signed: 03/26/2023 3:48:27 PM By: Brenton Grills Entered By: Brenton Grills on 03/26/2023 06:56:16 -------------------------------------------------------------------------------- Wound Assessment Details Patient Name: Date of Service: [name]. 03/26/2023 9:30 A M Medical Record Number: [id number] Patient Account Number: [id number] Date of Birth/Sex: Treating RN: 1971/02/09 (52 y.o. [name] Primary Care Milton Sagona: Barbette Reichmann Other Clinician: Referring Myrella Fahs: Treating Analyn Matusek/Extender: Adolph Pollack Weeks in Treatment: 8 Wound Status Wound Number: 1 Primary Diabetic Wound/Ulcer of the Lower Extremity Etiology: Wound Location: Right, Lateral Lower Leg Wound Open Wounding Event: Other Lesion Status: Date Acquired: 01/01/2018 Notes: Pt. 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 Weeks Of Treatment: 8 Comorbid Anemia, Deep Vein Thrombosis, Hypertension, Peripheral Venous Clustered Wound: No History: Disease, Type II Diabetes, Gout Photos Wound Measurements [name], [name] ([id number]) Length: (cm) 4.6 Width: (cm) 1.3 Depth: (cm) 0.2 Area: (cm) 4.697 Volume: (cm) 0.939 [id number] Page 7 of 9 % Reduction in Area: 64.1% % Reduction in Volume: 82% Epithelialization: Small (1-33%) Tunneling: No Undermining: No Wound Description Classification: Grade 2 Wound Margin: Thickened Exudate Amount: Medium Exudate Type: Serosanguineous Exudate Color: red, brown Foul Odor After Cleansing: No Slough/Fibrino Yes Wound Bed Granulation Amount: Large (67-100%) Exposed Structure Granulation Quality: Red, Pink Fascia  Exposed: No Necrotic Amount: Small (1-33%) Fat Layer (Subcutaneous Tissue) Exposed: Yes Necrotic Quality: Eschar Tendon Exposed: No Muscle Exposed: No Joint Exposed: No Bone Exposed: No Periwound Skin Texture Texture Color No Abnormalities Noted: No No Abnormalities Noted: No Callus: No Atrophie Blanche: No Crepitus: No Cyanosis: No Excoriation: No Ecchymosis: No Induration: No Erythema: No Rash: No Hemosiderin Staining: Yes Scarring: No Mottled: No Pallor: No Moisture  Rubor: No No Abnormalities Noted: No Dry / Scaly: No Temperature / Pain Maceration: No Temperature: No Abnormality Treatment Notes Wound #1 (Lower Leg) Wound Laterality: Right, Lateral Cleanser Soap and Water Discharge Instruction: May shower and wash wound with dial antibacterial soap and water prior to dressing change. Vashe 5.8 (oz) Discharge Instruction: Cleanse the wound with Vashe prior to applying a clean dressing using gauze sponges, not tissue or cotton balls. Peri-Wound Care Triamcinolone 15 (g) Discharge Instruction: Use triamcinolone 15 (g) as directed Sween Lotion (Moisturizing lotion) Discharge Instruction: Apply moisturizing lotion as directed Topical Primary Dressing Secondary Dressing Woven Gauze Sponge, Non-Sterile 4x4 in Discharge Instruction: Apply over primary dressing as directed. Secured With Compression Wrap Urgo K2 Lite, (equivalent to a 3 layer) two layer compression system, regular Discharge Instruction: Apply Urgo K2 Lite as directed (alternative to 3 layer compression). Stockinette Compression Stockings Add-Ons Electronic Signature(s) Signed: 03/26/2023 3:48:27 PM By: Brenton Grills Entered By: Brenton Grills on 03/26/2023 06:52:13 [name] ([id number]) [id number] Page 8 of 9 -------------------------------------------------------------------------------- Wound Assessment Details Patient Name: Date of Service: [name]. 03/26/2023 9:30 A M Medical Record Number: [id number] Patient Account Number: [id number] Date of Birth/Sex: Treating RN: Feb 20, 1971 (52 y.o. [name] Primary Care Nicholas Ossa: Barbette Reichmann Other Clinician: Referring Jahayra Mazo: Treating Beulah Matusek/Extender: Adolph Pollack Weeks in Treatment: 8 Wound Status Wound Number: 2 Primary Diabetic Wound/Ulcer of the Lower Extremity Etiology: Wound Location: Left, Posterior Lower Leg Wound Open Wounding Event: Bite Status: Date Acquired: 09/14/2022 Comorbid Anemia, Deep Vein Thrombosis, Hypertension, Peripheral Venous Weeks Of Treatment: 8 History: Disease, Type II Diabetes, Gout Clustered Wound: No Photos Wound Measurements Length: (cm) 1 Width: (cm) 1.2 Depth: (cm) 0.1 Area: (cm) 0.942 Volume: (cm) 0.094 % Reduction in Area: 71.4% % Reduction in Volume: 71.5% Epithelialization: Large (67-100%) Wound Description Classification: Grade 1 Wound Margin: Distinct, outline attached Exudate Amount: None Present Foul Odor After Cleansing: No Slough/Fibrino No Wound Bed Granulation Amount: None Present (0%) Exposed Structure Necrotic Amount: Large (67-100%) Fascia Exposed: No Necrotic Quality: Eschar Fat Layer (Subcutaneous Tissue) Exposed: No Tendon Exposed: No Muscle Exposed: No Joint Exposed: No Bone Exposed: No Periwound Skin Texture Texture Color No Abnormalities Noted: No No Abnormalities Noted: No Callus: No Atrophie Blanche: No Crepitus: No Cyanosis: No Excoriation: No Ecchymosis: No Induration: No Erythema: No Rash: No Hemosiderin Staining: Yes Scarring: No Mottled: No Pallor: No Moisture Rubor: No No Abnormalities Noted: No Dry / Scaly: No Temperature / Pain Maceration: No Temperature: No Abnormality [name], [name] ([id number]) [id number] Page 9 of 9 Treatment Notes Wound #2 (Lower Leg) Wound Laterality: Left, Posterior Cleanser Soap and  Water Discharge Instruction: May shower and wash wound with dial antibacterial soap and water prior to dressing change. Vashe 5.8 (oz) Discharge Instruction: Cleanse the wound with Vashe prior to applying a clean dressing using gauze sponges, not tissue or cotton balls. Peri-Wound Care Sween Lotion (Moisturizing lotion) Discharge Instruction: Apply moisturizing lotion as directed Topical Primary Dressing PolyMem Silver Non-Adhesive Dressing, 4.25x4.25 in Discharge Instruction: Apply to wound bed as instructed Secondary Dressing Woven Gauze Sponge, Non-Sterile 4x4 in Discharge Instruction: Apply over primary dressing as directed. Secured With Compression Wrap Urgo K2 Lite, (equivalent to a 3 layer) two layer compression system, regular Discharge Instruction: Apply Urgo K2 Lite as directed (alternative to 3 layer compression). Stockinette Compression Stockings Facilities manager) Signed: 03/26/2023 3:48:27 PM By: Brenton Grills Entered By: Brenton Grills on 03/26/2023 06:52:45 -------------------------------------------------------------------------------- Vitals Details Patient Name: Date of Service: [name]  [name]. 03/26/2023 9:30 A M Medical Record Number: [id number] Patient Account Number: [id number] Date of Birth/Sex: Treating RN: 25-Nov-1970 (52 y.o. [name] Primary Care Renan Danese: Barbette Reichmann Other Clinician: Referring Kyzer Blowe: Treating Mamie Diiorio/Extender: Adolph Pollack Weeks in Treatment: 8 Vital Signs Time Taken: 09:30 Temperature (F): 98.2 Height (in): 70 Pulse (bpm): 58 Weight (lbs): 264 Respiratory Rate (breaths/min): 18 Body Mass Index (BMI): 37.9 Blood Pressure (mmHg): 111/74 Reference Range: 80 - 120 mg / dl Electronic Signature(s) Signed: 03/26/2023 3:48:27 PM By: Brenton Grills Entered By: Brenton Grills on 03/26/2023 06:44:25

## 2023-03-26 NOTE — Progress Notes (Signed)
WRANGLER, KLEVEN ([id number]) [id number] Page 1 of 11 Visit Report for 03/26/2023 Chief Complaint Document Details Patient Name: Date of Service: [name] 03/26/2023 9:30 A M Medical Record Number: [id number] Patient Account Number: [id number] Date of Birth/Sex: Treating RN: 12-15-1970 (52 y.o. M) Primary Care Provider: Barbette Reichmann Other Clinician: Referring Provider: Treating Provider/Extender: Adolph Pollack Price in Treatment: 8 Information Obtained from: Patient Chief Complaint 01/25/2023; bilateral lower extremity wounds Electronic Signature(s) Signed: 03/26/2023 1:53:07 PM By: Geralyn Corwin DO Entered By: Geralyn Corwin on 03/26/2023 07:10:54 -------------------------------------------------------------------------------- Cellular or Tissue Based Product Details Patient Name: Date of Service: [name] 03/26/2023 9:30 A M Medical Record Number: [id number] Patient Account Number: [id number] Date of Birth/Sex: Treating RN: [birth year] (52 y.o. [name] Primary Care Provider: Barbette Reichmann Other Clinician: Referring Provider: Treating Provider/Extender: Adolph Pollack Price in Treatment: 8 Cellular or Tissue Based Product Type Wound #1 Right,Lateral Lower Leg Applied to: Performed By: Clinician , Cellular or Tissue Based Product Type: Apligraf Level of Consciousness (Pre-procedure): Awake and Alert Pre-procedure Verification/Time Out Yes - 10:06 Taken: Location: trunk / arms / legs Wound Size (sq cm): 5.98 Product Size (sq cm): 44 Waste Size (sq cm): 33 Waste Reason: too big Amount of Product Applied (sq cm): 11 Instrument Used: Blade, Forceps, Scissors Lot #: [id number] Order #: 5 Expiration Date: 03/27/2023 Fenestrated: Yes Instrument: Blade Reconstituted: Yes Solution Type: NS Solution Amount: 3ml Lot #: [id number] Solution Expiration Date:  08/06/2026 Secured: Yes Secured With: Steri-Strips Dressing Applied: Yes Primary Dressing: Adaptic Procedural Pain: 0 Post Procedural PainEWARD, HELOU ([id number]) [id number] Page 2 of 11 Response to Treatment: Procedure was tolerated well Level of Consciousness (Post- Awake and Alert procedure): Post Procedure Diagnosis Same as Pre-procedure Notes Scribed for Dr Mikey Bussing by Brenton Grills RN Electronic Signature(s) Signed: 03/26/2023 1:53:07 PM By: Geralyn Corwin DO Signed: 03/26/2023 3:48:27 PM By: Brenton Grills Entered By: Brenton Grills on 03/26/2023 07:07:30 -------------------------------------------------------------------------------- Debridement Details Patient Name: Date of Service: [name] 03/26/2023 9:30 A M Medical Record Number: [id number] Patient Account Number: [id number] Date of Birth/Sex: Treating RN: 13-Nov-1970 (52 y.o. [name] Primary Care Provider: Barbette Reichmann Other Clinician: Referring Provider: Treating Provider/Extender: Adolph Pollack Price in Treatment: 8 Debridement Performed for Assessment: Wound #1 Right,Lateral Lower Leg Performed By: Physician Geralyn Corwin, DO Debridement Type: Debridement Severity of Tissue Pre Debridement: Fat layer exposed Level of Consciousness (Pre-procedure): Awake and Alert Pre-procedure Verification/Time Out Yes - 10:00 Taken: Start Time: 10:01 Pain Control: Lidocaine 4% T opical Solution Percent of Wound Bed Debrided: 100% T Area Debrided (cm): otal 4.69 Tissue and other material debrided: Non-Viable, Slough, Subcutaneous, Slough Level: Skin/Subcutaneous Tissue Debridement Description: Excisional Instrument: Curette Bleeding: Minimum Hemostasis Achieved: Pressure Response to Treatment: Procedure was tolerated well Level of Consciousness (Post- Awake and Alert procedure): Post Debridement Measurements of Total  Wound Length: (cm) 4.6 Width: (cm) 1.3 Depth: (cm) 0.2 Volume: (cm) 0.939 Character of Wound/Ulcer Post Debridement: Improved Severity of Tissue Post Debridement: Fat layer exposed Post Procedure Diagnosis Same as Pre-procedure Notes Scribed for Dr Mikey Bussing by Brenton Grills RN Electronic Signature(s) Signed: 03/26/2023 1:53:07 PM By: Geralyn Corwin DO Signed: 03/26/2023 3:48:27 PM By: Brenton Grills Entered By: Brenton Grills on 03/26/2023 07:06:48 [name] ([id number]) [id number] Page 3 of 11 -------------------------------------------------------------------------------- HPI Details Patient Name: Date of Service: [name] 03/26/2023 9:30 A M Medical Record Number: [id number] Patient Account Number: [id number] Date  of Birth/Sex: Treating RN: Jul 22, 1970 (52 y.o. M) Primary Care Provider: Barbette Reichmann Other Clinician: Referring Provider: Treating Provider/Extender: Adolph Pollack Price in Treatment: 8 History of Present Illness HPI Description: 01/25/2023 [name] is a 52 year old male with a past medical history of controlled type 2 diabetes on insulin, venous insufficiency and DVT that presents to the clinic for bilateral lower extremity wounds. He states that the wound on the left was started by a bug bite and has been present for the past 4 months. The right lower extremity wound he states started out as a soft tissue infection and has been present for the past 6 years. He has been treated by a wound care center in [place] [facility] in [place]. He was last seen in the wound care center 2 years ago Could not continue following up due to lack of transportation. He states that recently the rehab center has been wrapping his legs with compression wrap and a wound dressing underneath. It is unclear when the last time this happened was. He is not wearing any compression today. He  currently denies signs of infection. 7/19; patient presents for follow-up. We have been using antibiotic ointment with Hydrofera Blue under 3 layer compression to the lower extremities bilaterally. Wounds are smaller. 7/30; patient presents for follow-up. We have been using antibiotic ointment and Hydrofera Blue under 3 layer compression to the lower extremities bilaterally. Wounds are smaller. 8/6; patient presents for follow-up. We have been using antibiotic ointment with Hydrofera Blue under 3 layer compression to the lower extremities bilaterally. Wounds are stable. He denies signs of infection. 8/15; patient presents for follow-up. We have been using collagen to the left lower extremity and PolyMem silver to the right lower extremity all under 3 layer compression. Wounds are slightly smaller today. Patient has been approved for Apligraf and he was agreeable to having this placed in office. 8/20; patient presents for follow-up. At last clinic visit we used Apligraf to the right lower extremity under 3 layer compression and collagen to the left lower extremity under 3 layer compression. Wounds are smaller. 8/27; Patient presents for follow-up. We have been using Apligraf to the right lower extremity wound and collagen to the left lower extremity wound under 3 layer compression. Overall wounds appear well-healing. He has no issues or complaints. 9/30; patient presents for follow-up. We have been using Apligraf to the right lower extremity wound and switch to PolyMem to the left lower extremity wound. All under 3 layer compression. Wounds are well-healing. 9/10; patient presents for follow-up. We have been using Apligraf to the right lower extremity wound and PolyMem silver to the left lower extremity wound all under 3 layer compression. Wounds are smaller. Electronic Signature(s) Signed: 03/26/2023 1:53:07 PM By: Geralyn Corwin DO Entered By: Geralyn Corwin on 03/26/2023  07:11:36 -------------------------------------------------------------------------------- Physical Exam Details Patient Name: Date of Service: [name] 03/26/2023 9:30 A M Medical Record Number: [id number] Patient Account Number: [id number] Date of Birth/Sex: Treating RN: August 08, 1970 (52 y.o. M) Primary Care Provider: Barbette Reichmann Other Clinician: Referring Provider: Treating Provider/Extender: Marcha Dutton, [name] in Treatment: 8 Constitutional respirations regular, non-labored and within target range for patient.. Cardiovascular 2+ dorsalis pedis/posterior tibialis pulses. Psychiatric pleasant and cooperative. CARSTEN, KOCIS ([id number]) [id number] Page 4 of 11 Notes Right lower extremity: T the posterior/lateral aspect there is an open wound with granulation tissue and nonviable tissue. Good edema control . No surrounding o signs of infection including increased warmth,  erythema or purulent drainage. Left lower extremity: T the posterior aspect there is an open wound with o granulation tissue. No signs of surrounding soft tissue infection including increased warmth, erythema or purulent drainage. Electronic Signature(s) Signed: 03/26/2023 1:53:07 PM By: Geralyn Corwin DO Entered By: Geralyn Corwin on 03/26/2023 07:12:02 -------------------------------------------------------------------------------- Physician Orders Details Patient Name: Date of Service: [name] 03/26/2023 9:30 A M Medical Record Number: [id number] Patient Account Number: [id number] Date of Birth/Sex: Treating RN: 05/21/1971 (53 y.o. [name] Primary Care Provider: Barbette Reichmann Other Clinician: Referring Provider: Treating Provider/Extender: Adolph Pollack Price in Treatment: 8 Verbal / Phone Orders: No Diagnosis Coding Follow-up Appointments ppointment in 1 week. - Dr. Mikey Bussing Room 9 -  Please schedule appt. Return A ppointment in 2 Price. - Dr Mikey Bussing Rm 7or 9 Please ask front desk to schedule an appointment Return A Return appointment in 3 Price. - Dr Mikey Bussing Rm 7or 9 Please ask front desk to schedule an appopintment Return appointment in 1 month. - Dr Mikey Bussing Rm 7or 9 Please ask front desk to schedule an appopintment Anesthetic (In clinic) Topical Lidocaine 4% applied to wound bed Cellular or Tissue Based Products Wound #1 Right,Lateral Lower Leg Cellular or Tissue Based Product Type: - Apligraf #1 applied 02/28/23 Apligraf #2 applied 03/05/2023 Apligraf #3 applied 03/12/2023 Apligraf #4 applied 03/19/2023 Apligraf #5 applied on 03/26/2023 Other Cellular or Tissue Based Products Orders/Instructions: - Already done- [employer] verification for skin substitute Bathing/ Shower/ Hygiene May shower with protection but do not get wound dressing(s) wet. Protect dressing(s) with water repellant cover (for example, large plastic bag) or a cast cover and may then take shower. - Please do not get the legs wet. Please use cast protectors when showering. Cast protectors can be purchased from [employer], Medical supply store. Cost ranges from $17-$37 Edema Control - Lymphedema / SCD / Other Bilateral Lower Extremities Lymphedema Pumps. Use Lymphedema pumps on leg(s) 2-3 times a day for 45-60 minutes. If wearing any wraps or hose, do not remove them. Continue exercising as instructed. - Keep using the Lymphadema Pumps.You may place the th Lyphadema pumps over the compression wraps. Elevate legs to the level of the heart or above for 30 minutes daily and/or when sitting for 3-4 times a day throughout the day. Avoid standing for long periods of time. Exercise regularly - As tolerated Off-Loading Other: - Keep legs elevated to heart level or above when sitting. You may place a pillow behind the calf for comfort, if so desired. Wound Treatment Wound #1 - Lower Leg  Wound Laterality: Right, Lateral Cleanser: Soap and Water 1 x Per Week/30 Days Discharge Instructions: May shower and wash wound with dial antibacterial soap and water prior to dressing change. Cleanser: Vashe 5.8 (oz) 1 x Per Week/30 Days Discharge Instructions: Cleanse the wound with Vashe prior to applying a clean dressing using gauze sponges, not tissue or cotton balls. Peri-Wound Care: Triamcinolone 15 (g) 1 x Per Week/30 Days Discharge Instructions: Use triamcinolone 15 (g) as directed Peri-Wound Care: Sween Lotion (Moisturizing lotion) 1 x Per Week/30 Days Discharge Instructions: Apply moisturizing lotion as directed TRAVYON, COOLING ([id number]) [id number] Page 5 of 11 Secondary Dressing: Woven Gauze Sponge, Non-Sterile 4x4 in 1 x Per Week/30 Days Discharge Instructions: Apply over primary dressing as directed. Compression Wrap: Urgo K2 Lite, (equivalent to a 3 layer) two layer compression system, regular 1 x Per Week/30 Days Discharge Instructions: Apply Urgo K2 Lite as directed (alternative to 3 layer  compression). Compression Wrap: Stockinette 1 x Per Week/30 Days Wound #2 - Lower Leg Wound Laterality: Left, Posterior Cleanser: Soap and Water 1 x Per Week/30 Days Discharge Instructions: May shower and wash wound with dial antibacterial soap and water prior to dressing change. Cleanser: Vashe 5.8 (oz) 1 x Per Week/30 Days Discharge Instructions: Cleanse the wound with Vashe prior to applying a clean dressing using gauze sponges, not tissue or cotton balls. Peri-Wound Care: Sween Lotion (Moisturizing lotion) 1 x Per Week/30 Days Discharge Instructions: Apply moisturizing lotion as directed Prim Dressing: PolyMem Silver Non-Adhesive Dressing, 4.25x4.25 in 1 x Per Week/30 Days ary Discharge Instructions: Apply to wound bed as instructed Secondary Dressing: Woven Gauze Sponge, Non-Sterile 4x4 in 1 x Per Week/30 Days Discharge Instructions: Apply over  primary dressing as directed. Compression Wrap: Urgo K2 Lite, (equivalent to a 3 layer) two layer compression system, regular 1 x Per Week/30 Days Discharge Instructions: Apply Urgo K2 Lite as directed (alternative to 3 layer compression). Compression Wrap: Stockinette 1 x Per Week/30 Days Electronic Signature(s) Signed: 03/26/2023 1:53:07 PM By: Geralyn Corwin DO Entered By: Geralyn Corwin on 03/26/2023 07:12:12 -------------------------------------------------------------------------------- Problem List Details Patient Name: Date of Service: [name] 03/26/2023 9:30 A M Medical Record Number: [id number] Patient Account Number: [id number] Date of Birth/Sex: Treating RN: 09-Feb-1971 (52 y.o. [name] Primary Care Provider: Barbette Reichmann Other Clinician: Referring Provider: Treating Provider/Extender: Adolph Pollack Price in Treatment: 8 Active Problems ICD-10 Encounter Code Description Active Date MDM Diagnosis L97.812 Non-pressure chronic ulcer of other part of right lower leg with fat layer 01/25/2023 No Yes exposed L97.822 Non-pressure chronic ulcer of other part of left lower leg with fat layer exposed7/06/2023 No Yes E11.622 Type 2 diabetes mellitus with other skin ulcer 01/25/2023 No Yes I87.313 Chronic venous hypertension (idiopathic) with ulcer of bilateral lower extremity 01/25/2023 No Yes CELIA, ELTZ ([id number]) [id number] Page 6 of 11 Inactive Problems Resolved Problems Electronic Signature(s) Signed: 03/26/2023 1:53:07 PM By: Geralyn Corwin DO Entered By: Geralyn Corwin on 03/26/2023 07:10:35 -------------------------------------------------------------------------------- Progress Note Details Patient Name: Date of Service: [name] 03/26/2023 9:30 A M Medical Record Number: [id number] Patient Account Number: [id number] Date of Birth/Sex: Treating RN: 02-Jan-1971 (52 y.o.  M) Primary Care Provider: Barbette Reichmann Other Clinician: Referring Provider: Treating Provider/Extender: Adolph Pollack Price in Treatment: 8 Subjective Chief Complaint Information obtained from Patient 01/25/2023; bilateral lower extremity wounds History of Present Illness (HPI) 01/25/2023 [name] is a 52 year old male with a past medical history of controlled type 2 diabetes on insulin, venous insufficiency and DVT that presents to the clinic for bilateral lower extremity wounds. He states that the wound on the left was started by a bug bite and has been present for the past 4 months. The right lower extremity wound he states started out as a soft tissue infection and has been present for the past 6 years. He has been treated by a wound care center in [place] [facility] in [place]. He was last seen in the wound care center 2 years ago Could not continue following up due to lack of transportation. He states that recently the rehab center has been wrapping his legs with compression wrap and a wound dressing underneath. It is unclear when the last time this happened was. He is not wearing any compression today. He currently denies signs of infection. 7/19; patient presents for follow-up. We have been using antibiotic ointment with Hydrofera Blue under 3  layer compression to the lower extremities bilaterally. Wounds are smaller. 7/30; patient presents for follow-up. We have been using antibiotic ointment and Hydrofera Blue under 3 layer compression to the lower extremities bilaterally. Wounds are smaller. 8/6; patient presents for follow-up. We have been using antibiotic ointment with Hydrofera Blue under 3 layer compression to the lower extremities bilaterally. Wounds are stable. He denies signs of infection. 8/15; patient presents for follow-up. We have been using collagen to the left lower extremity and PolyMem silver to the right lower  extremity all under 3 layer compression. Wounds are slightly smaller today. Patient has been approved for Apligraf and he was agreeable to having this placed in office. 8/20; patient presents for follow-up. At last clinic visit we used Apligraf to the right lower extremity under 3 layer compression and collagen to the left lower extremity under 3 layer compression. Wounds are smaller. 8/27; Patient presents for follow-up. We have been using Apligraf to the right lower extremity wound and collagen to the left lower extremity wound under 3 layer compression. Overall wounds appear well-healing. He has no issues or complaints. 9/30; patient presents for follow-up. We have been using Apligraf to the right lower extremity wound and switch to PolyMem to the left lower extremity wound. All under 3 layer compression. Wounds are well-healing. 9/10; patient presents for follow-up. We have been using Apligraf to the right lower extremity wound and PolyMem silver to the left lower extremity wound all under 3 layer compression. Wounds are smaller. Patient History Information obtained from Patient. Family History Heart Disease, Hypertension - Mother,Siblings, Kidney Disease - Father,Siblings, Stroke - Mother, No family history of Diabetes. Social History Never smoker, Marital Status - Single, Alcohol Use - Never, Drug Use - No History, Caffeine Use - Moderate - soda,coffee. Medical History Hematologic/Lymphatic Patient has history of Anemia Cardiovascular Patient has history of Deep Vein Thrombosis - both leg, Hypertension, Peripheral Venous Disease Endocrine Patient has history of Type II Diabetes - HgbA1c 6.2 (5/24) Musculoskeletal KENLEY, MORGANO ([id number]) [id number] Page 7 of 11 Patient has history of Gout Hospitalization/Surgery History - scrotal surgery. - right elbow surgery. - right elbow septic arthritis IandD x2 with skin graft. - hernia repair 2022. - 2018  Ecoli sepsis. - necrotizing fasciitis. Medical A Surgical History Notes nd Constitutional Symptoms (General Health) CVA Cardiovascular cellulitis right leg 2018 Genitourinary CKD stage 2 Objective Constitutional respirations regular, non-labored and within target range for patient.. Vitals Time Taken: 9:30 AM, Height: 70 in, Weight: 264 lbs, BMI: 37.9, Temperature: 98.2 F, Pulse: 58 bpm, Respiratory Rate: 18 breaths/min, Blood Pressure: 111/74 mmHg. Cardiovascular 2+ dorsalis pedis/posterior tibialis pulses. Psychiatric pleasant and cooperative. General Notes: Right lower extremity: T the posterior/lateral aspect there is an open wound with granulation tissue and nonviable tissue. Good edema control . o No surrounding signs of infection including increased warmth, erythema or purulent drainage. Left lower extremity: T the posterior aspect there is an open o wound with granulation tissue. No signs of surrounding soft tissue infection including increased warmth, erythema or purulent drainage. Integumentary (Hair, Skin) Wound #1 status is Open. Original cause of wound was Other Lesion. The date acquired was: 01/01/2018. The wound has been in treatment 8 Price. The wound is located on the Right,Lateral Lower Leg. The wound measures 4.6cm length x 1.3cm width x 0.2cm depth; 4.697cm^2 area and 0.939cm^3 volume. There is Fat Layer (Subcutaneous Tissue) exposed. There is no tunneling or undermining noted. There is a medium amount of serosanguineous drainage noted. The wound margin  is thickened. There is large (67-100%) red, pink granulation within the wound bed. There is a small (1-33%) amount of necrotic tissue within the wound bed including Eschar. The periwound skin appearance exhibited: Hemosiderin Staining. The periwound skin appearance did not exhibit: Callus, Crepitus, Excoriation, Induration, Rash, Scarring, Dry/Scaly, Maceration, Atrophie Blanche, Cyanosis, Ecchymosis, Mottled, Pallor,  Rubor, Erythema. Periwound temperature was noted as No Abnormality. Wound #2 status is Open. Original cause of wound was Bite. The date acquired was: 09/14/2022. The wound has been in treatment 8 Price. The wound is located on the Left,Posterior Lower Leg. The wound measures 1cm length x 1.2cm width x 0.1cm depth; 0.942cm^2 area and 0.094cm^3 volume. There is a none present amount of drainage noted. The wound margin is distinct with the outline attached to the wound base. There is no granulation within the wound bed. There is a large (67-100%) amount of necrotic tissue within the wound bed including Eschar. The periwound skin appearance exhibited: Hemosiderin Staining. The periwound skin appearance did not exhibit: Callus, Crepitus, Excoriation, Induration, Rash, Scarring, Dry/Scaly, Maceration, Atrophie Blanche, Cyanosis, Ecchymosis, Mottled, Pallor, Rubor, Erythema. Periwound temperature was noted as No Abnormality. Assessment Active Problems ICD-10 Non-pressure chronic ulcer of other part of right lower leg with fat layer exposed Non-pressure chronic ulcer of other part of left lower leg with fat layer exposed Type 2 diabetes mellitus with other skin ulcer Chronic venous hypertension (idiopathic) with ulcer of bilateral lower extremity Patient's wounds have shown improvement in size and appearance since last clinic visit. I debrided nonviable tissue. I recommended continuing the course with PolyMem silver under 3 layer compression to the left lower extremity. Apligraf was placed in standard fashion to the right lower extremity. We continue the compression wrap here as well. Follow-up in 1 week. Procedures Wound #1 Pre-procedure diagnosis of Wound #1 is a Diabetic Wound/Ulcer of the Lower Extremity located on the Right,Lateral Lower Leg .Severity of Tissue Pre Debridement is: Fat layer exposed. There was a Excisional Skin/Subcutaneous Tissue Debridement with a total area of 4.69 sq cm performed  by Geralyn Corwin, DO. With the following instrument(s): Curette to remove Non-Viable tissue/material. Material removed includes Subcutaneous Tissue and [place] and ALGIRD, RIETZ ([id number]) [id number] Page 8 of 11 after achieving pain control using Lidocaine 4% Topical Solution. No specimens were taken. A time out was conducted at 10:00, prior to the start of the procedure. A Minimum amount of bleeding was controlled with Pressure. The procedure was tolerated well. Post Debridement Measurements: 4.6cm length x 1.3cm width x 0.2cm depth; 0.939cm^3 volume. Character of Wound/Ulcer Post Debridement is improved. Severity of Tissue Post Debridement is: Fat layer exposed. Post procedure Diagnosis Wound #1: Same as Pre-Procedure General Notes: Scribed for Dr Mikey Bussing by Brenton Grills RN. Pre-procedure diagnosis of Wound #1 is a Diabetic Wound/Ulcer of the Lower Extremity located on the Right,Lateral Lower Leg. A skin graft procedure using a bioengineered skin substitute/cellular or tissue based product was performed with the following instrument(s): Blade, Forceps, and Scissors. Apligraf was applied and secured with Steri-Strips. 11 sq cm of product was utilized and 33 sq cm was wasted due to too big. Post Application, Adaptic was applied. A Time Out was conducted at 10:06, prior to the start of the procedure. The procedure was tolerated well with a pain level of 0 throughout and a pain level of 0 following the procedure. Post procedure Diagnosis Wound #1: Same as Pre-Procedure General Notes: Scribed for Dr Mikey Bussing by Brenton Grills RN. Pre-procedure diagnosis of Wound #1 is  a Diabetic Wound/Ulcer of the Lower Extremity located on the Right,Lateral Lower Leg . There was a Three Layer Compression Therapy Procedure by Brenton Grills, RN. Post procedure Diagnosis Wound #1: Same as Pre-Procedure Wound #2 Pre-procedure diagnosis of Wound #2 is a Diabetic Wound/Ulcer of the  Lower Extremity located on the Left,Posterior Lower Leg . There was a Three Layer Compression Therapy Procedure by Brenton Grills, RN. Post procedure Diagnosis Wound #2: Same as Pre-Procedure Plan Follow-up Appointments: Return Appointment in 1 week. - Dr. Mikey Bussing Room 9 - Please schedule appt. Return Appointment in 2 Price. - Dr Mikey Bussing Rm 7or 9 Please ask front desk to schedule an appointment Return appointment in 3 Price. - Dr Mikey Bussing Rm 7or 9 Please ask front desk to schedule an appopintment Return appointment in 1 month. - Dr Mikey Bussing Rm 7or 9 Please ask front desk to schedule an appopintment Anesthetic: (In clinic) Topical Lidocaine 4% applied to wound bed Cellular or Tissue Based Products: Wound #1 Right,Lateral Lower Leg: Cellular or Tissue Based Product Type: - Apligraf #1 applied 02/28/23 Apligraf #2 applied 03/05/2023 Apligraf #3 applied 03/12/2023 Apligraf #4 applied 03/19/2023 Apligraf #5 applied on 03/26/2023 Other Cellular or Tissue Based Products Orders/Instructions: - Already done- [employer] verification for skin substitute Bathing/ Shower/ Hygiene: May shower with protection but do not get wound dressing(s) wet. Protect dressing(s) with water repellant cover (for example, large plastic bag) or a cast cover and may then take shower. - Please do not get the legs wet. Please use cast protectors when showering. Cast protectors can be purchased from [employer], Medical supply store. Cost ranges from $17-$37 Edema Control - Lymphedema / SCD / Other: Lymphedema Pumps. Use Lymphedema pumps on leg(s) 2-3 times a day for 45-60 minutes. If wearing any wraps or hose, do not remove them. Continue exercising as instructed. - Keep using the Lymphadema Pumps.You may place the th Lyphadema pumps over the compression wraps. Elevate legs to the level of the heart or above for 30 minutes daily and/or when sitting for 3-4 times a day throughout the day. Avoid standing for long  periods of time. Exercise regularly - As tolerated Off-Loading: Other: - Keep legs elevated to heart level or above when sitting. You may place a pillow behind the calf for comfort, if so desired. WOUND #1: - Lower Leg Wound Laterality: Right, Lateral Cleanser: Soap and Water 1 x Per Week/30 Days Discharge Instructions: May shower and wash wound with dial antibacterial soap and water prior to dressing change. Cleanser: Vashe 5.8 (oz) 1 x Per Week/30 Days Discharge Instructions: Cleanse the wound with Vashe prior to applying a clean dressing using gauze sponges, not tissue or cotton balls. Peri-Wound Care: Triamcinolone 15 (g) 1 x Per Week/30 Days Discharge Instructions: Use triamcinolone 15 (g) as directed Peri-Wound Care: Sween Lotion (Moisturizing lotion) 1 x Per Week/30 Days Discharge Instructions: Apply moisturizing lotion as directed Secondary Dressing: Woven Gauze Sponge, Non-Sterile 4x4 in 1 x Per Week/30 Days Discharge Instructions: Apply over primary dressing as directed. Com pression Wrap: Urgo K2 Lite, (equivalent to a 3 layer) two layer compression system, regular 1 x Per Week/30 Days Discharge Instructions: Apply Urgo K2 Lite as directed (alternative to 3 layer compression). Com pression Wrap: Stockinette 1 x Per Week/30 Days WOUND #2: - Lower Leg Wound Laterality: Left, Posterior Cleanser: Soap and Water 1 x Per Week/30 Days Discharge Instructions: May shower and wash wound with dial antibacterial soap and water prior to dressing change. Cleanser: Vashe 5.8 (oz) 1 x Per  Week/30 Days Discharge Instructions: Cleanse the wound with Vashe prior to applying a clean dressing using gauze sponges, not tissue or cotton balls. Peri-Wound Care: Sween Lotion (Moisturizing lotion) 1 x Per Week/30 Days Discharge Instructions: Apply moisturizing lotion as directed Prim Dressing: PolyMem Silver Non-Adhesive Dressing, 4.25x4.25 in 1 x Per Week/30 Days ary Discharge Instructions: Apply to  wound bed as instructed Secondary Dressing: Woven Gauze Sponge, Non-Sterile 4x4 in 1 x Per Week/30 Days Discharge Instructions: Apply over primary dressing as directed. Com pression Wrap: Urgo K2 Lite, (equivalent to a 3 layer) two layer compression system, regular 1 x Per Week/30 Days Discharge Instructions: Apply Urgo K2 Lite as directed (alternative to 3 layer compression). Com pression Wrap: Stockinette 1 x Per Week/30 Days 1. In office sharp debridement 2. Apligraf placed in standard fashion to the right lower extremity 3. PolyMem silverleft lower extremity WYDELL, ODHAM ([id number]) [id number] Page 9 of 11 4. 3 layer compression to lower extremities bilaterally 5. Follow-up in 1 week Electronic Signature(s) Signed: 03/26/2023 1:53:07 PM By: Geralyn Corwin DO Entered By: Geralyn Corwin on 03/26/2023 07:14:05 -------------------------------------------------------------------------------- HxROS Details Patient Name: Date of Service: [name] 03/26/2023 9:30 A M Medical Record Number: [id number] Patient Account Number: [id number] Date of Birth/Sex: Treating RN: 06-Feb-1971 (52 y.o. M) Primary Care Provider: Barbette Reichmann Other Clinician: Referring Provider: Treating Provider/Extender: Adolph Pollack Price in Treatment: 8 Information Obtained From Patient Constitutional Symptoms (General Health) Medical History: Past Medical History Notes: CVA Hematologic/Lymphatic Medical History: Positive for: Anemia Cardiovascular Medical History: Positive for: Deep Vein Thrombosis - both leg; Hypertension; Peripheral Venous Disease Past Medical History Notes: cellulitis right leg 2018 Endocrine Medical History: Positive for: Type II Diabetes - HgbA1c 6.2 (5/24) Time with diabetes: 9 years Treated with: Insulin Blood sugar tested every day: Yes Tested : 3 x day Blood sugar testing results: Breakfast:  97 Genitourinary Medical History: Past Medical History Notes: CKD stage 2 Musculoskeletal Medical History: Positive for: Gout Immunizations Pneumococcal Vaccine: Received Pneumococcal Vaccination: No Implantable Devices None Hospitalization / Surgery History Type of Hospitalization/Surgery [place]QUEL[place], [place] ([id number]) [id number] Page 10 of 11 scrotal surgery right elbow surgery right elbow septic arthritis IandD x2 with skin graft hernia repair 2022 2018 Ecoli sepsis necrotizing fasciitis Family and Social History Diabetes: No; Heart Disease: Yes; Hypertension: Yes - Mother,Siblings; Kidney Disease: Yes - Father,Siblings; Stroke: Yes - Mother; Never smoker; Marital Status - Single; Alcohol Use: Never; Drug Use: No History; Caffeine Use: Moderate - soda,coffee; Financial Concerns: No; Food, Clothing or Shelter Needs: No; Support System Lacking: No; Transportation Concerns: No Electronic Signature(s) Signed: 03/26/2023 1:53:07 PM By: Geralyn Corwin DO Entered By: Geralyn Corwin on 03/26/2023 07:11:42 -------------------------------------------------------------------------------- SuperBill Details Patient Name: Date of Service: [name] 03/26/2023 Medical Record Number: [id number] Patient Account Number: [id number] Date of Birth/Sex: Treating RN: Apr 11, 1971 (52 y.o. [name] Primary Care Provider: Barbette Reichmann Other Clinician: Referring Provider: Treating Provider/Extender: Adolph Pollack Price in Treatment: 8 Diagnosis Coding ICD-10 Codes Code Description [phone] Non-pressure chronic ulcer of other part of right lower leg with fat layer exposed L97.822 Non-pressure chronic ulcer of other part of left lower leg with fat layer exposed E11.622 Type 2 diabetes mellitus with other skin ulcer I87.313 Chronic venous hypertension (idiopathic) with ulcer of bilateral lower extremity Facility  Procedures : CPT4 Code: 34742595 Description: (Facility Use Only) Apligraf 1 SQ CM Modifier: Quantity: 44 : CPT4 Code: 63875643 Description: 15271 - SKIN SUB GRAFT TRNK/ARM/LEG ICD-10 Diagnosis Description  W29.562 Non-pressure chronic ulcer of other part of right lower leg with fat layer expo E11.622 Type 2 diabetes mellitus with other skin ulcer I87.313 Chronic venous  hypertension (idiopathic) with ulcer of bilateral lower extremit Modifier: sed y Quantity: 1 Physician Procedures : CPT4 Code Description Modifier [id number] - WC PHYS LEVEL 3 - EST PT 25 ICD-10 Diagnosis Description L97.822 Non-pressure chronic ulcer of other part of left lower leg with fat layer exposed E11.622 Type 2 diabetes mellitus with other skin ulcer  I87.313 Chronic venous hypertension (idiopathic) with ulcer of bilateral lower extremity Quantity: 1 : [id number] 15271 - WC PHYS SKIN SUB GRAFT TRNK/ARM/LEG ICD-10 Diagnosis Description L97.812 Non-pressure chronic ulcer of other part of right lower leg with fat layer exposed E11.622 Type 2 diabetes mellitus with other skin ulcer I87.313 Chronic venous  hypertension (idiopathic) with ulcer of bilateral lower extremity Quantity: 1 Electronic Signature(s) Signed: 03/26/2023 1:53:07 PM By: Doristine Counter, Chandra Batch ([id number]) [id number] Page 11 of 11 Entered By: Geralyn Corwin on 03/26/2023 07:14:40

## 2023-03-27 NOTE — Progress Notes (Signed)
MAYKEL, GELL ([id number]) [id number] Page 1 of 8 Visit Report for 03/19/2023 Arrival Information Details Patient Name: Date of Service: [name] 03/19/2023 9:30 A M Medical Record Number: [id number] Patient Account Number: [id number] Date of Birth/Sex: Treating RN: [birth year] (52 y.o. [name] Primary Care Bernardino Dowell: Barbette Reichmann Other Clinician: Referring Feven Alderfer: Treating Roniqua Kintz/Extender: Adolph Pollack Weeks in Treatment: 7 Visit Information History Since Last Visit All ordered tests and consults were completed: Yes Patient Arrived: Ambulatory Added or deleted any medications: No Arrival Time: 09:55 Any new allergies or adverse reactions: No Accompanied By: self Had a fall or experienced change in No Transfer Assistance: None activities of daily living that may affect Patient Identification Verified: Yes risk of falls: Secondary Verification Process Completed: Yes Signs or symptoms of abuse/neglect since last visito No Patient Requires Transmission-Based Precautions: No Hospitalized since last visit: No Patient Has Alerts: No Implantable device outside of the clinic excluding No cellular tissue based products placed in the center since last visit: Has Dressing in Place as Prescribed: Yes Pain Present Now: No Electronic Signature(s) Signed: 03/27/2023 7:59:39 AM By: Brenton Grills Entered By: Brenton Grills on 03/19/2023 09:55:58 -------------------------------------------------------------------------------- Encounter Discharge Information Details Patient Name: Date of Service: [name] 03/19/2023 9:30 A M Medical Record Number: [id number] Patient Account Number: [id number] Date of Birth/Sex: Treating RN: 03/11/1971 (52 y.o. [name] Primary Care Madell Heino: Barbette Reichmann Other Clinician: Referring Valena Ivanov: Treating Joell Buerger/Extender: Adolph Pollack Weeks in Treatment: 7 Encounter Discharge Information Items Post Procedure Vitals Discharge Condition: Stable Temperature (F): 98.5 Ambulatory Status: Ambulatory Pulse (bpm): 80 Discharge Destination: Home Respiratory Rate (breaths/min): 18 Transportation: Private Auto Blood Pressure (mmHg): 140/68 Accompanied By: self Schedule Follow-up Appointment: Yes Clinical Summary of Care: Patient Declined Electronic Signature(s) Signed: 03/27/2023 7:59:39 AM By: Brenton Grills Entered By: Brenton Grills on 03/19/2023 10:48:10 [name] ([id number]) [id number] Page 2 of 8 -------------------------------------------------------------------------------- Lower Extremity Assessment Details Patient Name: Date of Service: [name] 03/19/2023 9:30 A M Medical Record Number: [id number] Patient Account Number: [id number] Date of Birth/Sex: Treating RN: 02-23-1971 (52 y.o. [name] Primary Care Latima Hamza: Barbette Reichmann Other Clinician: Referring Ignazio Kincaid: Treating Luvena Wentling/Extender: Adolph Pollack Weeks in Treatment: 7 Edema Assessment Assessed: [Left: No] [Right: No] Edema: [Left: No] [Right: No] Calf Left: Right: Point of Measurement: 34 cm From Medial Instep 34.5 cm 33.4 cm Ankle Left: Right: Point of Measurement: 10 cm From Medial Instep 21.4 cm 21.3 cm Vascular Assessment Pulses: Dorsalis Pedis Palpable: [Left:Yes] [Right:Yes] Extremity colors, hair growth, and conditions: Extremity Color: [Left:Normal] [Right:Normal] Hair Growth on Extremity: [Left:No] [Right:No] Temperature of Extremity: [Left:Warm] [Right:Warm] Capillary Refill: [Left:< 3 seconds] [Right:< 3 seconds] Dependent Rubor: [Left:No No] [Right:No No] Toe Nail Assessment Left: Right: Thick: Yes Yes Discolored: Yes Yes Deformed: Yes Yes Improper Length and Hygiene: Yes Yes Electronic Signature(s) Signed: 03/27/2023 7:59:39 AM By:  Brenton Grills Entered By: Brenton Grills on 03/19/2023 10:08:04 -------------------------------------------------------------------------------- Multi Wound Chart Details Patient Name: Date of Service: [name] 03/19/2023 9:30 A M Medical Record Number: [id number] Patient Account Number: [id number] Date of Birth/Sex: Treating RN: 12/02/1970 (52 y.o. M) Primary Care Venancio Chenier: Barbette Reichmann Other Clinician: Referring Paisely Brick: Treating Lanea Vankirk/Extender: Marcha Dutton, Vishwanath Weeks in Treatment: 7 Vital Signs Height(in): 70 Pulse(bpm): 61 Weight(lbs): 264 Blood Pressure(mmHg): 117/74 Body Mass Index(BMI): 37.9 [name], KENNEMER K ([id number]) [id number] Page 3 of 8 Temperature(F): 98 Respiratory Rate(breaths/min): 18 [1:Photos: No Photos Right, Lateral  Lower Leg Wound Location: Other Lesion Wounding Event: Diabetic Wound/Ulcer of the Lower Primary Etiology: Extremity Anemia, Deep Vein Thrombosis, Comorbid History: Hypertension, Peripheral Venous Disease, Type II Diabetes, Gout  01/01/2018 Date Acquired: 7 Weeks of Treatment: Open Wound Status: No Wound Recurrence: 4.8x1.3x0.2 Measurements L x W x D (cm) 4.901 A (cm) : rea 0.98 Volume (cm) : 62.50% % Reduction in A rea: 81.30% % Reduction in Volume: Grade 2 Classification: Medium  Exudate A mount: Serosanguineous Exudate Type: red, brown Exudate Color: Thickened Wound Margin: Large (67-100%) Granulation A mount: Red, Pink Granulation Quality: Small (1-33%) Necrotic A mount: Eschar Necrotic Tissue: Fat Layer (Subcutaneous Tissue):  Yes Fascia: No Exposed Structures: Fascia: No Tendon: No Muscle: No Joint: No Bone: No Small (1-33%) Epithelialization: Debridement - Excisional Debridement: Pre-procedure Verification/Time Out 10:23 Taken: Lidocaine 4% Topical Solution Pain Control:  Subcutaneous, Slough Tissue Debrided: Skin/Subcutaneous Tissue Level: 4.9 Debridement A (sq cm): rea Curette,  Forceps, Scissors Instrument: Minimum Bleeding: Pressure Hemostasis A chieved: 0 Procedural Pain: 0 Post Procedural Pain: Procedure was  tolerated well Debridement Treatment Response: 4.8x1.3x0.2 Post Debridement Measurements L x W x D (cm) 0.98 Post Debridement Volume: (cm) Excoriation: No Periwound Skin Texture: Induration: No Callus: No Crepitus: No Rash: No Scarring: No Maceration: No  Periwound Skin Moisture: Dry/Scaly: No Hemosiderin Staining: Yes Periwound Skin Color: Atrophie Blanche: No Cyanosis: No Ecchymosis: No Erythema: No Mottled: No Pallor: No Rubor: No No Abnormality Temperature: Cellular or Tissue Based Product Procedures  Performed: Debridement] [2:No Photos Left, Posterior Lower Leg Bite Diabetic Wound/Ulcer of the Lower Extremity Anemia, Deep Vein Thrombosis, Hypertension, Peripheral Venous Disease, Type II Diabetes, Gout 09/14/2022 7 Open No 1.5x1.3x0.1 1.532 0.153  53.60% 53.60% Grade 1 None Present N/A N/A Distinct, outline attached None Present (0%) N/A Large (67-100%) Eschar Fat Layer (Subcutaneous Tissue): No Tendon: No Muscle: No Joint: No Bone: No Large (67-100%) N/A N/A N/A N/A N/A N/A N/A N/A N/A N/A N/A  N/A N/A N/A Excoriation: No Induration: No Callus: No Crepitus: No Rash: No Scarring: No Maceration: No Dry/Scaly: No Hemosiderin Staining: Yes Atrophie Blanche: No Cyanosis: No Ecchymosis: No Erythema: No Mottled: No Pallor: No Rubor: No No Abnormality  N/A] [N/A:N/A N/A N/A N/A N/A N/A N/A N/A N/A N/A N/A N/A N/A N/A N/A N/A N/A N/A N/A N/A N/A N/A N/A N/A N/A N/A N/A N/A N/A N/A N/A N/A N/A N/A N/A N/A N/A N/A N/A N/A N/A N/A N/A N/A] Treatment Notes Wound #1 (Lower Leg) Wound Laterality: Right, Lateral Cleanser Soap and Water Discharge Instruction: May shower and wash wound with dial antibacterial soap and water prior to dressing change. Vashe 5.8 (oz) Discharge Instruction: Cleanse the wound with Vashe prior to applying a clean dressing using gauze sponges, not tissue  or cotton balls. PAARTH, MACHO ([id number]) [id number] Page 4 of 8 Peri-Wound Care Sween Lotion (Moisturizing lotion) Discharge Instruction: Apply moisturizing lotion as directed Topical Primary Dressing Secondary Dressing Woven Gauze Sponge, Non-Sterile 4x4 in Discharge Instruction: Apply over primary dressing as directed. Secured With Compression Wrap Urgo K2 Lite, (equivalent to a 3 layer) two layer compression system, regular Discharge Instruction: Apply Urgo K2 Lite as directed (alternative to 3 layer compression). Stockinette Compression Stockings Add-Ons Wound #2 (Lower Leg) Wound Laterality: Left, Posterior Cleanser Soap and Water Discharge Instruction: May shower and wash wound with dial antibacterial soap and water prior to dressing change. Vashe 5.8 (oz) Discharge Instruction: Cleanse the wound with Vashe prior to applying a clean dressing using gauze sponges, not tissue  or cotton balls. Peri-Wound Care Sween Lotion (Moisturizing lotion) Discharge Instruction: Apply moisturizing lotion as directed Topical Primary Dressing PolyMem Silver Non-Adhesive Dressing, 4.25x4.25 in Discharge Instruction: Apply to wound bed as instructed Secondary Dressing Woven Gauze Sponge, Non-Sterile 4x4 in Discharge Instruction: Apply over primary dressing as directed. Secured With Compression Wrap Urgo K2 Lite, (equivalent to a 3 layer) two layer compression system, regular Discharge Instruction: Apply Urgo K2 Lite as directed (alternative to 3 layer compression). Stockinette Compression Stockings Add-Ons Electronic Signature(s) Signed: 03/19/2023 2:04:51 PM By: Geralyn Corwin DO Entered By: Geralyn Corwin on 03/19/2023 11:48:32 -------------------------------------------------------------------------------- Multi-Disciplinary Care Plan Details Patient Name: Date of Service: [name] 03/19/2023 9:30 A M Medical Record Number:  [id number] Patient Account Number: [id number] Date of Birth/Sex: Treating RN: 01-28-1971 (52 y.o. [name] Primary Care Freddy Kinne: Barbette Reichmann Other Clinician: OSSIEL, PITZEN ([id number]) [id number] Page 5 of 8 Referring Deasiah Hagberg: Treating Jensyn Cambria/Extender: Marcha Dutton, Vishwanath Weeks in Treatment: 7 Active Inactive Wound/Skin Impairment Nursing Diagnoses: Impaired tissue integrity Goals: Patient/caregiver will verbalize understanding of skin care regimen Date Initiated: 01/25/2023 Target Resolution Date: 05/16/2023 Goal Status: Active Interventions: Assess ulceration(s) every visit Treatment Activities: Skin care regimen initiated : 01/25/2023 Notes: Electronic Signature(s) Signed: 03/27/2023 7:59:39 AM By: Brenton Grills Entered By: Brenton Grills on 03/19/2023 10:12:40 -------------------------------------------------------------------------------- Pain Assessment Details Patient Name: Date of Service: [name] 03/19/2023 9:30 A M Medical Record Number: [id number] Patient Account Number: [id number] Date of Birth/Sex: Treating RN: 05-20-1971 (52 y.o. [name] Primary Care Chaselyn Nanney: Barbette Reichmann Other Clinician: Referring Tailor Lucking: Treating Sawyer Kahan/Extender: Adolph Pollack Weeks in Treatment: 7 Active Problems Location of Pain Severity and Description of Pain Patient Has Paino No Site Locations Pain Management and Medication Current Pain Management: Electronic Signature(s) Signed: 03/27/2023 7:59:39 AM By: Durward Parcel, Gary K ([id number]) Brenton Grills [phone].pdf Page 6 of 8 Signed: 03/27/2023 7:59:39 AM By: Margaret Pyle By: Brenton Grills on 03/19/2023 09:58:32 -------------------------------------------------------------------------------- Patient/Caregiver Education Details Patient Name: Date of Service: [name]  9/3/2024andnbsp9:30 A M Medical Record Number: [id number] Patient Account Number: [id number] Date of Birth/Gender: Treating RN: April 30, 1971 (52 y.o. [name] Primary Care Physician: Barbette Reichmann Other Clinician: Referring Physician: Treating Physician/Extender: Adolph Pollack Weeks in Treatment: 7 Education Assessment Education Provided To: Patient Education Topics Provided Wound/Skin Impairment: Methods: Explain/Verbal Responses: State content correctly Electronic Signature(s) Signed: 03/27/2023 7:59:39 AM By: Brenton Grills Entered By: Brenton Grills on 03/19/2023 10:13:03 -------------------------------------------------------------------------------- Wound Assessment Details Patient Name: Date of Service: [name] 03/19/2023 9:30 A M Medical Record Number: [id number] Patient Account Number: [id number] Date of Birth/Sex: Treating RN: 1971-03-19 (52 y.o. [name] Primary Care Alexyia Guarino: Barbette Reichmann Other Clinician: Referring Kenyatte Chatmon: Treating Yarel Rushlow/Extender: Adolph Pollack Weeks in Treatment: 7 Wound Status Wound Number: 1 Primary Diabetic Wound/Ulcer of the Lower Extremity Etiology: Wound Location: Right, Lateral Lower Leg Wound Open Wounding Event: Other Lesion Status: Date Acquired: 01/01/2018 Notes: Pt. 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 Weeks Of Treatment: 7 Comorbid Anemia, Deep Vein Thrombosis, Hypertension, Peripheral Venous Clustered Wound: No History: Disease, Type II Diabetes, Gout Wound Measurements Length: (cm) 4.8 Width: (cm) 1.3 Depth: (cm) 0.2 Area: (cm) 4.901 Volume: (cm) 0.98 % Reduction in Area: 62.5% % Reduction in Volume: 81.3% Epithelialization: Small (1-33%) Tunneling: No Undermining: No Wound Description Classification: Grade 2 Wound Margin: Thickened Exudate Amount: Medium Exudate Type: Serosanguineous Exudate Color: red, brown MUHAMAD, GAULD K ([id number]) Foul  Odor After Cleansing: No Slough/Fibrino Yes [id number] Page 7 of 8 Wound Bed Granulation Amount: Large (67-100%) Exposed Structure Granulation Quality: Red, Pink Fascia Exposed: No Necrotic Amount: Small (1-33%) Fat Layer (Subcutaneous Tissue) Exposed: Yes Necrotic Quality: Eschar Tendon Exposed: No Muscle Exposed: No Joint Exposed: No Bone Exposed: No Periwound Skin Texture Texture Color No Abnormalities Noted: No No Abnormalities Noted: No Callus: No Atrophie Blanche: No Crepitus: No Cyanosis: No Excoriation: No Ecchymosis: No Induration: No Erythema: No Rash: No Hemosiderin Staining: Yes Scarring: No Mottled: No Pallor: No Moisture Rubor: No No Abnormalities Noted: No Dry / Scaly: No Temperature / Pain Maceration: No Temperature: No Abnormality Electronic Signature(s) Signed: 03/27/2023 7:59:39 AM By: Brenton Grills Entered By: Brenton Grills on 03/19/2023 10:09:54 -------------------------------------------------------------------------------- Wound Assessment Details Patient Name: Date of Service: [name] 03/19/2023 9:30 A M Medical Record Number: [id number] Patient Account Number: [id number] Date of Birth/Sex: Treating RN: 1970-08-24 (52 y.o. [name] Primary Care Alamin Mccuiston: Barbette Reichmann Other Clinician: Referring Latonda Larrivee: Treating Jahad Old/Extender: Adolph Pollack Weeks in Treatment: 7 Wound Status Wound Number: 2 Primary Diabetic Wound/Ulcer of the Lower Extremity Etiology: Wound Location: Left, Posterior Lower Leg Wound Open Wounding Event: Bite Status: Date Acquired: 09/14/2022 Comorbid Anemia, Deep Vein Thrombosis, Hypertension, Peripheral Venous Weeks Of Treatment: 7 History: Disease, Type II Diabetes, Gout Clustered Wound: No Wound Measurements Length: (cm) 1.5 Width: (cm) 1.3 Depth: (cm) 0.1 Area: (cm) 1.532 Volume: (cm) 0.153 % Reduction  in Area: 53.6% % Reduction in Volume: 53.6% Epithelialization: Large (67-100%) Tunneling: No Undermining: No Wound Description Classification: Grade 1 Wound Margin: Distinct, outline attached Exudate Amount: None Present Foul Odor After Cleansing: No Slough/Fibrino No Wound Bed Granulation Amount: None Present (0%) Exposed Structure Necrotic Amount: Large (67-100%) Fascia Exposed: No Necrotic Quality: Eschar Fat Layer (Subcutaneous Tissue) Exposed: No Tendon Exposed: No Muscle Exposed: No Joint Exposed: No Bone Exposed: No CAMILA, THRASHER K ([id number]) [id number].pdf Page 8 of 8 Periwound Skin Texture Texture Color No Abnormalities Noted: No No Abnormalities Noted: No Callus: No Atrophie Blanche: No Crepitus: No Cyanosis: No Excoriation: No Ecchymosis: No Induration: No Erythema: No Rash: No Hemosiderin Staining: Yes Scarring: No Mottled: No Pallor: No Moisture Rubor: No No Abnormalities Noted: No Dry / Scaly: No Temperature / Pain Maceration: No Temperature: No Abnormality Electronic Signature(s) Signed: 03/27/2023 7:59:39 AM By: Brenton Grills Entered By: Brenton Grills on 03/19/2023 10:10:11 -------------------------------------------------------------------------------- Vitals Details Patient Name: Date of Service: [name] 03/19/2023 9:30 A M Medical Record Number: [id number] Patient Account Number: [id number] Date of Birth/Sex: Treating RN: 1970/12/25 (52 y.o. [name] Primary Care Lakyra Tippins: Barbette Reichmann Other Clinician: Referring Huie Ghuman: Treating Nickol Collister/Extender: Adolph Pollack Weeks in Treatment: 7 Vital Signs Time Taken: 09:56 Temperature (F): 98 Height (in): 70 Pulse (bpm): 61 Weight (lbs): 264 Respiratory Rate (breaths/min): 18 Body Mass Index (BMI): 37.9 Blood Pressure (mmHg): 117/74 Reference Range: 80 - 120 mg / dl Electronic Signature(s) Signed: 03/27/2023  7:59:39 AM By: Brenton Grills Entered By: Brenton Grills on 03/19/2023 09:58:24

## 2023-03-27 NOTE — Progress Notes (Signed)
IRIE, MERKER ([id number]) [id number] Page 1 of 10 Visit Report for 03/19/2023 Chief Complaint Document Details Patient Name: Date of Service: [name] [name]. 03/19/2023 9:30 A M Medical Record Number: [id number] Patient Account Number: [id number] Date of Birth/Sex: Treating RN: [birth year] (52 y.o. M) Primary Care Provider: Barbette Reichmann Other Clinician: Referring Provider: Treating Provider/Extender: Adolph Pollack Weeks in Treatment: 7 Information Obtained from: Patient Chief Complaint 01/25/2023; bilateral lower extremity wounds Electronic Signature(s) Signed: 03/19/2023 2:04:51 PM By: Geralyn Corwin DO Entered By: Geralyn Corwin on 03/19/2023 11:52:25 -------------------------------------------------------------------------------- Cellular or Tissue Based Product Details Patient Name: Date of Service: [name] [name] Bulla K. 03/19/2023 9:30 A M Medical Record Number: [id number] Patient Account Number: [id number] Date of Birth/Sex: Treating RN: [birth year] (52 y.o. [name] Primary Care Provider: Barbette Reichmann Other Clinician: Referring Provider: Treating Provider/Extender: Adolph Pollack Weeks in Treatment: 7 Cellular or Tissue Based Product Type Wound #1 Right,Lateral Lower Leg Applied to: Performed By: Physician Geralyn Corwin, DO Cellular or Tissue Based Product Type: Apligraf Level of Consciousness (Pre-procedure): Awake and Alert Pre-procedure Verification/Time Out Yes - 10:21 Taken: Location: trunk / arms / legs Wound Size (sq cm): 6.24 Product Size (sq cm): 44 Waste Size (sq cm): 24 Waste Reason: too big Amount of Product Applied (sq cm): 20 Instrument Used: Blade, Curette, Forceps, Scissors Lot #: [id number] Order #: 4 Expiration Date: 03/19/2023 Fenestrated: Yes Instrument: Blade Reconstituted: Yes Solution Type: NS Solution Amount: 3 Lot #: [id number] Solution  Expiration Date: 08/06/2026 Secured: Yes Secured With: Steri-Strips Dressing Applied: Yes Primary Dressing: adaptic Procedural Pain: 0 Post Procedural PainDYWAN, [name] ([id number]) [id number] Page 2 of 10 Response to Treatment: Procedure was tolerated well Level of Consciousness (Post- Awake and Alert procedure): Post Procedure Diagnosis Same as Pre-procedure Notes Scribed for Dr Mikey Bussing by Brenton Grills RN Electronic Signature(s) Signed: 03/19/2023 2:04:51 PM By: Geralyn Corwin DO Signed: 03/27/2023 7:59:39 AM By: Brenton Grills Entered By: Brenton Grills on 03/19/2023 10:22:37 -------------------------------------------------------------------------------- Debridement Details Patient Name: Date of Service: [name] [name] Bulla K. 03/19/2023 9:30 A M Medical Record Number: [id number] Patient Account Number: [id number] Date of Birth/Sex: Treating RN: 12/24/1970 (52 y.o. [name] Primary Care Provider: Barbette Reichmann Other Clinician: Referring Provider: Treating Provider/Extender: Adolph Pollack Weeks in Treatment: 7 Debridement Performed for Assessment: Wound #1 Right,Lateral Lower Leg Performed By: Physician Geralyn Corwin, DO Debridement Type: Debridement Severity of Tissue Pre Debridement: Fat layer exposed Level of Consciousness (Pre-procedure): Awake and Alert Pre-procedure Verification/Time Out Yes - 10:23 Taken: Start Time: 10:25 Pain Control: Lidocaine 4% T opical Solution Percent of Wound Bed Debrided: 100% T Area Debrided (cm): otal 4.9 Tissue and other material debrided: Non-Viable, Slough, Subcutaneous, Slough Level: Skin/Subcutaneous Tissue Debridement Description: Excisional Instrument: Curette, Forceps, Scissors Bleeding: Minimum Hemostasis Achieved: Pressure End Time: 10:26 Procedural Pain: 0 Post Procedural Pain: 0 Response to Treatment: Procedure was tolerated well Level of  Consciousness (Post- Awake and Alert procedure): Post Debridement Measurements of Total Wound Length: (cm) 4.8 Width: (cm) 1.3 Depth: (cm) 0.2 Volume: (cm) 0.98 Character of Wound/Ulcer Post Debridement: Improved Severity of Tissue Post Debridement: Fat layer exposed Post Procedure Diagnosis Same as Pre-procedure Notes Scribed for Dr Mikey Bussing by Brenton Grills RN Electronic Signature(s) Signed: 03/19/2023 2:04:51 PM By: Geralyn Corwin DO Signed: 03/27/2023 7:59:39 AM By: Brenton Grills Entered By: Brenton Grills on 03/19/2023 10:24:14 [name] ([id number]) [id number] Page 3 of 10 -------------------------------------------------------------------------------- HPI Details Patient Name: Date of Service: [name],  [name] K. 03/19/2023 9:30 A M Medical Record Number: [id number] Patient Account Number: [id number] Date of Birth/Sex: Treating RN: May 11, 1971 (52 y.o. M) Primary Care Provider: Barbette Reichmann Other Clinician: Referring Provider: Treating Provider/Extender: Adolph Pollack Weeks in Treatment: 7 History of Present Illness HPI Description: 01/25/2023 [name] is a 52 year old male with a past medical history of controlled type 2 diabetes on insulin, venous insufficiency and DVT that presents to the clinic for bilateral lower extremity wounds. He states that the wound on the left was started by a bug bite and has been present for the past 4 months. The right lower extremity wound he states started out as a soft tissue infection and has been present for the past 6 years. He has been treated by a wound care center in [facility] in [place]. He was last seen in the wound care center 2 years ago Could not continue following up due to lack of transportation. He states that recently the rehab center has been wrapping his legs with compression wrap and a wound dressing underneath. It is unclear  when the last time this happened was. He is not wearing any compression today. He currently denies signs of infection. 7/19; patient presents for follow-up. We have been using antibiotic ointment with Hydrofera Blue under 3 layer compression to the lower extremities bilaterally. Wounds are smaller. 7/30; patient presents for follow-up. We have been using antibiotic ointment and Hydrofera Blue under 3 layer compression to the lower extremities bilaterally. Wounds are smaller. 8/6; patient presents for follow-up. We have been using antibiotic ointment with Hydrofera Blue under 3 layer compression to the lower extremities bilaterally. Wounds are stable. He denies signs of infection. 8/15; patient presents for follow-up. We have been using collagen to the left lower extremity and PolyMem silver to the right lower extremity all under 3 layer compression. Wounds are slightly smaller today. Patient has been approved for Apligraf and he was agreeable to having this placed in office. 8/20; patient presents for follow-up. At last clinic visit we used Apligraf to the right lower extremity under 3 layer compression and collagen to the left lower extremity under 3 layer compression. Wounds are smaller. 8/27; Patient presents for follow-up. We have been using Apligraf to the right lower extremity wound and collagen to the left lower extremity wound under 3 layer compression. Overall wounds appear well-healing. He has no issues or complaints. 9/30; patient presents for follow-up. We have been using Apligraf to the right lower extremity wound and switch to PolyMem to the left lower extremity wound. All under 3 layer compression. Wounds are well-healing. Electronic Signature(s) Signed: 03/19/2023 2:04:51 PM By: Geralyn Corwin DO Entered By: Geralyn Corwin on 03/19/2023 11:54:50 -------------------------------------------------------------------------------- Physical Exam Details Patient Name: Date of  Service: [name] [name] 03/19/2023 9:30 A M Medical Record Number: [id number] Patient Account Number: [id number] Date of Birth/Sex: Treating RN: 1970/08/30 (52 y.o. M) Primary Care Provider: Barbette Reichmann Other Clinician: Referring Provider: Treating Provider/Extender: Marcha Dutton, Vishwanath Weeks in Treatment: 7 Constitutional respirations regular, non-labored and within target range for patient.. Cardiovascular 2+ dorsalis pedis/posterior tibialis pulses. Psychiatric pleasant and cooperative. [name], [name] ([id number]) [id number] Page 4 of 10 Notes Right lower extremity: T the posterior/lateral aspect there is an open wound with granulation tissue and nonviable tissue. Good edema control . No surrounding o signs of infection including increased warmth, erythema or purulent drainage. Left lower extremity: T the posterior aspect there is an open wound with o  granulation tissue. No signs of surrounding soft tissue infection including increased warmth, erythema or purulent drainage. Electronic Signature(s) Signed: 03/19/2023 2:04:51 PM By: Geralyn Corwin DO Entered By: Geralyn Corwin on 03/19/2023 11:57:07 -------------------------------------------------------------------------------- Physician Orders Details Patient Name: Date of Service: [name] [name] Bulla K. 03/19/2023 9:30 A M Medical Record Number: [id number] Patient Account Number: [id number] Date of Birth/Sex: Treating RN: 1971/04/23 (52 y.o. [name] Primary Care Provider: Barbette Reichmann Other Clinician: Referring Provider: Treating Provider/Extender: Adolph Pollack Weeks in Treatment: 7 Verbal / Phone Orders: No Diagnosis Coding Follow-up Appointments ppointment in 1 week. - Dr. Mikey Bussing Room 9 Return A ppointment in 2 weeks. - Dr Mikey Bussing Rm 7or 9 Please ask front desk to schedule an appopintment Return A Return appointment in 3  weeks. - Dr Mikey Bussing Rm 7or 9 Please ask front desk to schedule an appopintment Return appointment in 1 month. - Dr Mikey Bussing Rm 7or 9 Please ask front desk to schedule an appopintment Anesthetic (In clinic) Topical Lidocaine 4% applied to wound bed Cellular or Tissue Based Products Wound #1 Right,Lateral Lower Leg Cellular or Tissue Based Product Type: - Apligraf #1 applied 02/28/23 Apligraf #2 applied 03/05/2023 Apligraf #3 applied 03/12/2023 Apligraf #4 applied 03/19/2023 Other Cellular or Tissue Based Products Orders/Instructions: - Already done- Run insurance verification for skin substitute Bathing/ Shower/ Hygiene May shower with protection but do not get wound dressing(s) wet. Protect dressing(s) with water repellant cover (for example, large plastic bag) or a cast cover and may then take shower. - Please do not get the legs wet. Please use cast protectors when showering. Cast protectors can be purchased from [employer], Medical supply store. Cost ranges from $17-$37 Edema Control - Lymphedema / SCD / Other Bilateral Lower Extremities Lymphedema Pumps. Use Lymphedema pumps on leg(s) 2-3 times a day for 45-60 minutes. If wearing any wraps or hose, do not remove them. Continue exercising as instructed. - Keep using the Lymphadema Pumps.You may place the th Lyphadema pumps over the compression wraps. Elevate legs to the level of the heart or above for 30 minutes daily and/or when sitting for 3-4 times a day throughout the day. Avoid standing for long periods of time. Exercise regularly - As tolerated Off-Loading Other: - Keep legs elevated to heart level or above when sitting. You may place a pillow behind the calf for comfort, if so desired. Wound Treatment Wound #1 - Lower Leg Wound Laterality: Right, Lateral Cleanser: Soap and Water 1 x Per Week/30 Days Discharge Instructions: May shower and wash wound with dial antibacterial soap and water prior to dressing  change. Cleanser: Vashe 5.8 (oz) 1 x Per Week/30 Days Discharge Instructions: Cleanse the wound with Vashe prior to applying a clean dressing using gauze sponges, not tissue or cotton balls. Peri-Wound Care: Sween Lotion (Moisturizing lotion) 1 x Per Week/30 Days Discharge Instructions: Apply moisturizing lotion as directed Secondary Dressing: Woven Gauze Sponge, Non-Sterile 4x4 in 1 x Per Week/30 Days Discharge Instructions: Apply over primary dressing as directed. Compression Wrap: Urgo K2 Lite, (equivalent to a 3 layer) two layer compression system, regular 1 x Per Week/30 Days Discharge Instructions: Apply Urgo K2 Lite as directed (alternative to 3 layer compression). [name], [name] ([id number]) [id number] Page 5 of 10 Compression Wrap: Stockinette 1 x Per Week/30 Days Wound #2 - Lower Leg Wound Laterality: Left, Posterior Cleanser: Soap and Water 1 x Per Week/30 Days Discharge Instructions: May shower and wash wound with dial antibacterial soap and water prior to dressing change. Cleanser:  Vashe 5.8 (oz) 1 x Per Week/30 Days Discharge Instructions: Cleanse the wound with Vashe prior to applying a clean dressing using gauze sponges, not tissue or cotton balls. Peri-Wound Care: Sween Lotion (Moisturizing lotion) 1 x Per Week/30 Days Discharge Instructions: Apply moisturizing lotion as directed Prim Dressing: PolyMem Silver Non-Adhesive Dressing, 4.25x4.25 in 1 x Per Week/30 Days ary Discharge Instructions: Apply to wound bed as instructed Secondary Dressing: Woven Gauze Sponge, Non-Sterile 4x4 in 1 x Per Week/30 Days Discharge Instructions: Apply over primary dressing as directed. Compression Wrap: Urgo K2 Lite, (equivalent to a 3 layer) two layer compression system, regular 1 x Per Week/30 Days Discharge Instructions: Apply Urgo K2 Lite as directed (alternative to 3 layer compression). Compression Wrap: Stockinette 1 x Per Week/30 Days Electronic  Signature(s) Signed: 03/19/2023 2:04:51 PM By: Geralyn Corwin DO Entered By: Geralyn Corwin on 03/19/2023 12:00:36 -------------------------------------------------------------------------------- Problem List Details Patient Name: Date of Service: [name] [name] Bulla K. 03/19/2023 9:30 A M Medical Record Number: [id number] Patient Account Number: [id number] Date of Birth/Sex: Treating RN: [birth year] (52 y.o. [name] Primary Care Provider: Barbette Reichmann Other Clinician: Referring Provider: Treating Provider/Extender: Adolph Pollack Weeks in Treatment: 7 Active Problems ICD-10 Encounter Code Description Active Date MDM Diagnosis L97.812 Non-pressure chronic ulcer of other part of right lower leg with fat layer 01/25/2023 No Yes exposed L97.822 Non-pressure chronic ulcer of other part of left lower leg with fat layer exposed7/06/2023 No Yes E11.622 Type 2 diabetes mellitus with other skin ulcer 01/25/2023 No Yes I87.313 Chronic venous hypertension (idiopathic) with ulcer of bilateral lower extremity 01/25/2023 No Yes Inactive Problems Resolved Problems [name], [name] ([id number]) [id number] Page 6 of 10 Electronic Signature(s) Signed: 03/19/2023 2:04:51 PM By: Geralyn Corwin DO Entered By: Geralyn Corwin on 03/19/2023 11:48:24 -------------------------------------------------------------------------------- Progress Note Details Patient Name: Date of Service: [name] [name] Bulla K. 03/19/2023 9:30 A M Medical Record Number: [id number] Patient Account Number: [id number] Date of Birth/Sex: Treating RN: 01-19-1971 (52 y.o. M) Primary Care Provider: Barbette Reichmann Other Clinician: Referring Provider: Treating Provider/Extender: Adolph Pollack Weeks in Treatment: 7 Subjective Chief Complaint Information obtained from Patient 01/25/2023; bilateral lower extremity wounds History of Present Illness  (HPI) 01/25/2023 Mr. Demontra Barthell is a 52 year old male with a past medical history of controlled type 2 diabetes on insulin, venous insufficiency and DVT that presents to the clinic for bilateral lower extremity wounds. He states that the wound on the left was started by a bug bite and has been present for the past 4 months. The right lower extremity wound he states started out as a soft tissue infection and has been present for the past 6 years. He has been treated by a wound care center in [facility] in [place]. He was last seen in the wound care center 2 years ago Could not continue following up due to lack of transportation. He states that recently the rehab center has been wrapping his legs with compression wrap and a wound dressing underneath. It is unclear when the last time this happened was. He is not wearing any compression today. He currently denies signs of infection. 7/19; patient presents for follow-up. We have been using antibiotic ointment with Hydrofera Blue under 3 layer compression to the lower extremities bilaterally. Wounds are smaller. 7/30; patient presents for follow-up. We have been using antibiotic ointment and Hydrofera Blue under 3 layer compression to the lower extremities bilaterally. Wounds are smaller. 8/6; patient presents for follow-up. We have been using  antibiotic ointment with Hydrofera Blue under 3 layer compression to the lower extremities bilaterally. Wounds are stable. He denies signs of infection. 8/15; patient presents for follow-up. We have been using collagen to the left lower extremity and PolyMem silver to the right lower extremity all under 3 layer compression. Wounds are slightly smaller today. Patient has been approved for Apligraf and he was agreeable to having this placed in office. 8/20; patient presents for follow-up. At last clinic visit we used Apligraf to the right lower extremity under 3 layer compression and collagen  to the left lower extremity under 3 layer compression. Wounds are smaller. 8/27; Patient presents for follow-up. We have been using Apligraf to the right lower extremity wound and collagen to the left lower extremity wound under 3 layer compression. Overall wounds appear well-healing. He has no issues or complaints. 9/30; patient presents for follow-up. We have been using Apligraf to the right lower extremity wound and switch to PolyMem to the left lower extremity wound. All under 3 layer compression. Wounds are well-healing. Patient History Information obtained from Patient. Family History Heart Disease, Hypertension - Mother,Siblings, Kidney Disease - Father,Siblings, Stroke - Mother, No family history of Diabetes. Social History Never smoker, Marital Status - Single, Alcohol Use - Never, Drug Use - No History, Caffeine Use - Moderate - soda,coffee. Medical History Hematologic/Lymphatic Patient has history of Anemia Cardiovascular Patient has history of Deep Vein Thrombosis - both leg, Hypertension, Peripheral Venous Disease Endocrine Patient has history of Type II Diabetes - HgbA1c 6.2 (5/24) Musculoskeletal Patient has history of Gout Hospitalization/Surgery History - scrotal surgery. - right elbow surgery. - right elbow septic arthritis IandD x2 with skin graft. - hernia repair 2022. - 2018 Ecoli sepsis. - necrotizing fasciitis. Medical A Surgical History Notes nd Constitutional Symptoms [facility]) CVA Cardiovascular [name], [name] ([id number]) [id number] Page 7 of 10 cellulitis right leg 2018 Genitourinary CKD stage 2 Objective Constitutional respirations regular, non-labored and within target range for patient.. Vitals Time Taken: 9:56 AM, Height: 70 in, Weight: 264 lbs, BMI: 37.9, Temperature: 98 F, Pulse: 61 bpm, Respiratory Rate: 18 breaths/min, Blood Pressure: 117/74 mmHg. Cardiovascular 2+ dorsalis pedis/posterior tibialis  pulses. Psychiatric pleasant and cooperative. General Notes: Right lower extremity: T the posterior/lateral aspect there is an open wound with granulation tissue and nonviable tissue. Good edema control . o No surrounding signs of infection including increased warmth, erythema or purulent drainage. Left lower extremity: T the posterior aspect there is an open o wound with granulation tissue. No signs of surrounding soft tissue infection including increased warmth, erythema or purulent drainage. Integumentary (Hair, Skin) Wound #1 status is Open. Original cause of wound was Other Lesion. The date acquired was: 01/01/2018. The wound has been in treatment 7 weeks. The wound is located on the Right,Lateral Lower Leg. The wound measures 4.8cm length x 1.3cm width x 0.2cm depth; 4.901cm^2 area and 0.98cm^3 volume. There is Fat Layer (Subcutaneous Tissue) exposed. There is no tunneling or undermining noted. There is a medium amount of serosanguineous drainage noted. The wound margin is thickened. There is large (67-100%) red, pink granulation within the wound bed. There is a small (1-33%) amount of necrotic tissue within the wound bed including Eschar. The periwound skin appearance exhibited: Hemosiderin Staining. The periwound skin appearance did not exhibit: Callus, Crepitus, Excoriation, Induration, Rash, Scarring, Dry/Scaly, Maceration, Atrophie Blanche, Cyanosis, Ecchymosis, Mottled, Pallor, Rubor, Erythema. Periwound temperature was noted as No Abnormality. Wound #2 status is Open. Original cause of wound was Bite. The date  acquired was: 09/14/2022. The wound has been in treatment 7 weeks. The wound is located on the Left,Posterior Lower Leg. The wound measures 1.5cm length x 1.3cm width x 0.1cm depth; 1.532cm^2 area and 0.153cm^3 volume. There is no tunneling or undermining noted. There is a none present amount of drainage noted. The wound margin is distinct with the outline attached to the wound  base. There is no granulation within the wound bed. There is a large (67-100%) amount of necrotic tissue within the wound bed including Eschar. The periwound skin appearance exhibited: Hemosiderin Staining. The periwound skin appearance did not exhibit: Callus, Crepitus, Excoriation, Induration, Rash, Scarring, Dry/Scaly, Maceration, Atrophie Blanche, Cyanosis, Ecchymosis, Mottled, Pallor, Rubor, Erythema. Periwound temperature was noted as No Abnormality. Assessment Active Problems ICD-10 Non-pressure chronic ulcer of other part of right lower leg with fat layer exposed Non-pressure chronic ulcer of other part of left lower leg with fat layer exposed Type 2 diabetes mellitus with other skin ulcer Chronic venous hypertension (idiopathic) with ulcer of bilateral lower extremity Patient's wounds appear well-healing. I debrided nonviable tissue. Apligraf was placed in standard fashion to the right lower extremity. I recommended continuing PolyMem silver to the left lower extremity. Continue 3 layer compression to the lower extremities bilaterally. Follow-up in 1 week. Procedures Wound #1 Pre-procedure diagnosis of Wound #1 is a Diabetic Wound/Ulcer of the Lower Extremity located on the Right,Lateral Lower Leg .Severity of Tissue Pre Debridement is: Fat layer exposed. There was a Excisional Skin/Subcutaneous Tissue Debridement with a total area of 4.9 sq cm performed by Geralyn Corwin, DO. With the following instrument(s): Curette, Forceps, and Scissors to remove Non-Viable tissue/material. Material removed includes Subcutaneous Tissue and Slough and after achieving pain control using Lidocaine 4% Topical Solution. No specimens were taken. A time out was conducted at 10:23, prior to the start of the procedure. A Minimum amount of bleeding was controlled with Pressure. The procedure was tolerated well with a pain level of 0 throughout and a pain level of 0 following the procedure. Post Debridement  Measurements: 4.8cm length x 1.3cm width x 0.2cm depth; 0.98cm^3 volume. Character of Wound/Ulcer Post Debridement is improved. Severity of Tissue Post Debridement is: Fat layer exposed. Post procedure Diagnosis Wound #1: Same as Pre-Procedure General Notes: Scribed for Dr Mikey Bussing by Brenton Grills RN. Pre-procedure diagnosis of Wound #1 is a Diabetic Wound/Ulcer of the Lower Extremity located on the Right,Lateral Lower Leg. A skin graft procedure using a bioengineered skin substitute/cellular or tissue based product was performed by Geralyn Corwin, DO with the following instrument(s): Blade, Curette, Forceps, and Scissors. Apligraf was applied and secured with Steri-Strips. 20 sq cm of product was utilized and 24 sq cm was wasted due to too big. [address] [name], [name] ([id number]) [id number] Page 8 of 10 Application, adaptic was applied. A Time Out was conducted at 10:21, prior to the start of the procedure. The procedure was tolerated well with a pain level of 0 throughout and a pain level of 0 following the procedure. Post procedure Diagnosis Wound #1: Same as Pre-Procedure General Notes: Scribed for Dr Mikey Bussing by Brenton Grills RN. Plan Follow-up Appointments: Return Appointment in 1 week. - Dr. Mikey Bussing Room 9 Return Appointment in 2 weeks. - Dr Mikey Bussing Rm 7or 9 Please ask front desk to schedule an appopintment Return appointment in 3 weeks. - Dr Mikey Bussing Rm 7or 9 Please ask front desk to schedule an appopintment Return appointment in 1 month. - Dr Mikey Bussing Rm 7or 9 Please ask front desk to schedule  an appopintment Anesthetic: (In clinic) Topical Lidocaine 4% applied to wound bed Cellular or Tissue Based Products: Wound #1 Right,Lateral Lower Leg: Cellular or Tissue Based Product Type: - Apligraf #1 applied 02/28/23 Apligraf #2 applied 03/05/2023 Apligraf #3 applied 03/12/2023 Apligraf #4 applied 03/19/2023 Other Cellular or Tissue Based Products Orders/Instructions:  - Already done- [employer] verification for skin substitute Bathing/ Shower/ Hygiene: May shower with protection but do not get wound dressing(s) wet. Protect dressing(s) with water repellant cover (for example, large plastic bag) or a cast cover and may then take shower. - Please do not get the legs wet. Please use cast protectors when showering. Cast protectors can be purchased from [employer], Medical supply store. Cost ranges from $17-$37 Edema Control - Lymphedema / SCD / Other: Lymphedema Pumps. Use Lymphedema pumps on leg(s) 2-3 times a day for 45-60 minutes. If wearing any wraps or hose, do not remove them. Continue exercising as instructed. - Keep using the Lymphadema Pumps.You may place the th Lyphadema pumps over the compression wraps. Elevate legs to the level of the heart or above for 30 minutes daily and/or when sitting for 3-4 times a day throughout the day. Avoid standing for long periods of time. Exercise regularly - As tolerated Off-Loading: Other: - Keep legs elevated to heart level or above when sitting. You may place a pillow behind the calf for comfort, if so desired. WOUND #1: - Lower Leg Wound Laterality: Right, Lateral Cleanser: Soap and Water 1 x Per Week/30 Days Discharge Instructions: May shower and wash wound with dial antibacterial soap and water prior to dressing change. Cleanser: Vashe 5.8 (oz) 1 x Per Week/30 Days Discharge Instructions: Cleanse the wound with Vashe prior to applying a clean dressing using gauze sponges, not tissue or cotton balls. Peri-Wound Care: Sween Lotion (Moisturizing lotion) 1 x Per Week/30 Days Discharge Instructions: Apply moisturizing lotion as directed Secondary Dressing: Woven Gauze Sponge, Non-Sterile 4x4 in 1 x Per Week/30 Days Discharge Instructions: Apply over primary dressing as directed. Com pression Wrap: Urgo K2 Lite, (equivalent to a 3 layer) two layer compression system, regular 1 x Per Week/30  Days Discharge Instructions: Apply Urgo K2 Lite as directed (alternative to 3 layer compression). Com pression Wrap: Stockinette 1 x Per Week/30 Days WOUND #2: - Lower Leg Wound Laterality: Left, Posterior Cleanser: Soap and Water 1 x Per Week/30 Days Discharge Instructions: May shower and wash wound with dial antibacterial soap and water prior to dressing change. Cleanser: Vashe 5.8 (oz) 1 x Per Week/30 Days Discharge Instructions: Cleanse the wound with Vashe prior to applying a clean dressing using gauze sponges, not tissue or cotton balls. Peri-Wound Care: Sween Lotion (Moisturizing lotion) 1 x Per Week/30 Days Discharge Instructions: Apply moisturizing lotion as directed Prim Dressing: PolyMem Silver Non-Adhesive Dressing, 4.25x4.25 in 1 x Per Week/30 Days ary Discharge Instructions: Apply to wound bed as instructed Secondary Dressing: Woven Gauze Sponge, Non-Sterile 4x4 in 1 x Per Week/30 Days Discharge Instructions: Apply over primary dressing as directed. Com pression Wrap: Urgo K2 Lite, (equivalent to a 3 layer) two layer compression system, regular 1 x Per Week/30 Days Discharge Instructions: Apply Urgo K2 Lite as directed (alternative to 3 layer compression). Com pression Wrap: Stockinette 1 x Per Week/30 Days 1. In office sharp debridement 2. Apligraf placed in standard fashion to the right lower extremity 3. PolyMem silverleft lower extremity 4. 3 layer compression to lower extremities bilaterally 5. Follow-up in 1 week Electronic Signature(s) Signed: 03/19/2023 2:04:51 PM By: Geralyn Corwin DO  Entered By: Geralyn Corwin on 03/19/2023 12:02:05 -------------------------------------------------------------------------------- HxROS Details Patient Name: Date of Service: [name] [name]. 03/19/2023 9:30 A M Medical Record Number: [id number] Patient Account Number: [id number] HEZRON, BRODEUR ([id number]) [phone].pdf Page 9 of 10 Date of  Birth/Sex: Treating RN: November 05, 1970 (52 y.o. M) Primary Care Provider: Barbette Reichmann Other Clinician: Referring Provider: Treating Provider/Extender: Adolph Pollack Weeks in Treatment: 7 Information Obtained From Patient Constitutional Symptoms (General Health) Medical History: Past Medical History Notes: CVA Hematologic/Lymphatic Medical History: Positive for: Anemia Cardiovascular Medical History: Positive for: Deep Vein Thrombosis - both leg; Hypertension; Peripheral Venous Disease Past Medical History Notes: cellulitis right leg 2018 Endocrine Medical History: Positive for: Type II Diabetes - HgbA1c 6.2 (5/24) Time with diabetes: 9 years Treated with: Insulin Blood sugar tested every day: Yes Tested : 3 x day Blood sugar testing results: Breakfast: 97 Genitourinary Medical History: Past Medical History Notes: CKD stage 2 Musculoskeletal Medical History: Positive for: Gout Immunizations Pneumococcal Vaccine: Received Pneumococcal Vaccination: No Implantable Devices None Hospitalization / Surgery History Type of Hospitalization/Surgery scrotal surgery right elbow surgery right elbow septic arthritis IandD x2 with skin graft hernia repair 2022 2018 Ecoli sepsis necrotizing fasciitis Family and Social History Diabetes: No; Heart Disease: Yes; Hypertension: Yes - Mother,Siblings; Kidney Disease: Yes - Father,Siblings; Stroke: Yes - Mother; Never smoker; Marital Status - Single; Alcohol Use: Never; Drug Use: No History; Caffeine Use: Moderate - soda,coffee; Financial Concerns: No; Food, Clothing or Shelter Needs: No; Support System Lacking: No; Transportation Concerns: No Electronic Signature(s) Signed: 03/19/2023 2:04:51 PM By: Geralyn Corwin DO Entered By: Geralyn Corwin on 03/19/2023 11:55:38 [name] ([id number]) [id number] Page 10 of  10 -------------------------------------------------------------------------------- SuperBill Details Patient Name: Date of Service: [name] [name] 03/19/2023 Medical Record Number: [id number] Patient Account Number: [id number] Date of Birth/Sex: Treating RN: 09-13-1970 (52 y.o. [name] Primary Care Provider: Barbette Reichmann Other Clinician: Referring Provider: Treating Provider/Extender: Adolph Pollack Weeks in Treatment: 7 Diagnosis Coding ICD-10 Codes Code Description [phone] Non-pressure chronic ulcer of other part of right lower leg with fat layer exposed L97.822 Non-pressure chronic ulcer of other part of left lower leg with fat layer exposed E11.622 Type 2 diabetes mellitus with other skin ulcer I87.313 Chronic venous hypertension (idiopathic) with ulcer of bilateral lower extremity Facility Procedures : CPT4 Code: [id number] Description: (Facility Use Only) Apligraf 1 SQ CM Modifier: Quantity: 44 : CPT4 Code: [id number] Description: 15271 - SKIN SUB GRAFT TRNK/ARM/LEG ICD-10 Diagnosis Description L97.812 Non-pressure chronic ulcer of other part of right lower leg with fat layer expos E11.622 Type 2 diabetes mellitus with other skin ulcer I87.313 Chronic venous  hypertension (idiopathic) with ulcer of bilateral lower extremity Modifier: ed Quantity: 1 Physician Procedures : CPT4 Code Description Modifier [id number] - WC PHYS LEVEL 3 - EST PT 25 ICD-10 Diagnosis Description L97.822 Non-pressure chronic ulcer of other part of left lower leg with fat layer exposed E11.622 Type 2 diabetes mellitus with other skin ulcer  I87.313 Chronic venous hypertension (idiopathic) with ulcer of bilateral lower extremity Quantity: 1 : [id number] 15271 - WC PHYS SKIN SUB GRAFT TRNK/ARM/LEG ICD-10 Diagnosis Description L97.812 Non-pressure chronic ulcer of other part of right lower leg with fat layer exposed E11.622 Type 2 diabetes mellitus with other skin ulcer  I87.313 Chronic venous  hypertension (idiopathic) with ulcer of bilateral lower extremity Quantity: 1 Electronic Signature(s) Signed: 03/19/2023 2:04:51 PM By: Geralyn Corwin DO Entered By: Geralyn Corwin on 03/19/2023 12:03:02

## 2023-04-02 ENCOUNTER — Ambulatory Visit (HOSPITAL_BASED_OUTPATIENT_CLINIC_OR_DEPARTMENT_OTHER): Payer: Medicaid Other | Admitting: Internal Medicine

## 2023-04-09 ENCOUNTER — Ambulatory Visit (HOSPITAL_BASED_OUTPATIENT_CLINIC_OR_DEPARTMENT_OTHER): Payer: Medicaid Other | Admitting: Internal Medicine

## 2023-04-16 ENCOUNTER — Encounter (HOSPITAL_BASED_OUTPATIENT_CLINIC_OR_DEPARTMENT_OTHER): Payer: Medicaid Other | Attending: Internal Medicine | Admitting: Internal Medicine

## 2023-04-16 DIAGNOSIS — E11622 Type 2 diabetes mellitus with other skin ulcer: Secondary | ICD-10-CM | POA: Diagnosis not present

## 2023-04-16 DIAGNOSIS — Z794 Long term (current) use of insulin: Secondary | ICD-10-CM | POA: Diagnosis not present

## 2023-04-16 DIAGNOSIS — I87313 Chronic venous hypertension (idiopathic) with ulcer of bilateral lower extremity: Secondary | ICD-10-CM | POA: Insufficient documentation

## 2023-04-16 DIAGNOSIS — I872 Venous insufficiency (chronic) (peripheral): Secondary | ICD-10-CM | POA: Insufficient documentation

## 2023-04-16 DIAGNOSIS — I10 Essential (primary) hypertension: Secondary | ICD-10-CM | POA: Insufficient documentation

## 2023-04-16 DIAGNOSIS — L97822 Non-pressure chronic ulcer of other part of left lower leg with fat layer exposed: Secondary | ICD-10-CM | POA: Insufficient documentation

## 2023-04-16 DIAGNOSIS — L97812 Non-pressure chronic ulcer of other part of right lower leg with fat layer exposed: Secondary | ICD-10-CM | POA: Insufficient documentation

## 2023-04-16 DIAGNOSIS — Z86718 Personal history of other venous thrombosis and embolism: Secondary | ICD-10-CM | POA: Diagnosis not present

## 2023-04-16 NOTE — Progress Notes (Signed)
on the surface and maceration but fortunately no evidence of infection. He reminds me that he had an extensive surgery on this area apparently several years ago by Dr. Lajoyce Corners for a "staph infection". He has compression stockings from elastic therapy in [place] he got last May he says they are relatively new he is hardly worn them. 8/15; patient presents for follow-up. We have been using collagen to the left lower extremity and PolyMem silver to the right lower extremity all under 3 layer compression. Wounds are slightly smaller today. Patient has been approved for Apligraf and he was agreeable to having this placed in office. 8/20; patient presents for follow-up. At last clinic visit we used Apligraf to the right lower extremity under 3 layer compression and collagen to the left lower extremity under 3 layer compression. Wounds are smaller. 8/27; Patient presents for follow-up. We have been using Apligraf to the right lower extremity wound and collagen to the left lower extremity wound under 3 layer compression. Overall wounds appear well-healing. He has no issues or complaints. 9/30; patient presents for follow-up. We have been using Apligraf to the right lower extremity wound and switch to PolyMem to the left lower extremity wound. All under 3 layer compression. Wounds are well-healing. 9/10; patient presents for follow-up. We have been using Apligraf to the right lower extremity wound and PolyMem silver to the left lower extremity wound all under 3 layer compression. Wounds are smaller. Electronic Signature(s) Signed: 04/16/2023 4:26:34 PM By: [name] MD Entered By: [name] on 04/16/2023  07:11:12 -------------------------------------------------------------------------------- Physical Exam Details Patient Name: Date of Service: [name] [name] Bulla Price. 04/16/2023 9:45 A M Medical Record Number: [id number] Patient Account Number: [id number] Date of Birth/Sex: Treating RN: 23-Sep-1970 (52 y.o. M) Primary Care Provider: Barbette Reichmann Other Clinician: Referring Provider: Treating Provider/Extender: Milas Kocher Weeks in Treatment: 11 Constitutional Sitting or standing Blood Pressure is within target range for patient.. Pulse regular and within target range for patient.Marland Kitchen Respirations regular, non-labored and within target range.. Temperature is normal and within the target range for the patient.Marland Kitchen Appears in no distress. Notes Wound exam Right lower extremity the wound really does not look too much different. Nonviable surface under illumination maceration around the wound fortunately no evidence of infection. His edema control is quite good. I used a #5 curette to debride the wound surface hemostasis with silver nitrate and a pressure dressing On the left medial calf the wound is healed edema control is good Electronic Signature(s) Signed: 04/16/2023 4:26:34 PM By: [name] MD Entered By: [name] on 04/16/2023 07:13:44 [name] ([id number]) [id number] Page 3 of 7 -------------------------------------------------------------------------------- Physician Orders Details Patient Name: Date of Service: [name] [name]. 04/16/2023 9:45 A M Medical Record Number: [id number] Patient Account Number: [id number] Date of Birth/Sex: Treating RN: 18-Sep-1970 (52 y.o. [name] Primary Care Provider: Barbette Reichmann Other Clinician: Referring Provider: Treating Provider/Extender: Milas Kocher Weeks in Treatment: 11 Verbal / Phone Orders: No Diagnosis Coding Follow-up  Appointments ppointment in 1 week. - Dr. Mikey Bussing Room 9 - Please schedule appt. Return A ppointment in 2 weeks. - Dr Mikey Bussing Rm 7or 9 Please ask front desk to schedule an appointment Return A Return appointment in 3 weeks. - Dr Mikey Bussing Rm 7or 9 Please ask front desk to schedule an appopintment Return appointment in 1 month. - Dr Mikey Bussing Rm 7or 9 Please ask front desk to schedule an appopintment Anesthetic (In clinic) Topical Lidocaine 4% applied to wound  bed Cellular or Tissue Based Products Wound #1 Right,Lateral Lower Leg Cellular or Tissue Based Product Type: - Apligraf #1 applied 02/28/23 Apligraf #2 applied 03/05/2023 Apligraf #3 applied 03/12/2023 Apligraf #4 applied 03/19/2023 Apligraf #5 applied on 03/26/2023 Bathing/ Shower/ Hygiene May shower with protection but do not get wound dressing(s) wet. Protect dressing(s) with water repellant cover (for example, large plastic bag) or a cast cover and may then take shower. - Please do not get the legs wet. Please use cast protectors when showering. Cast protectors can be purchased from [employer], Medical supply store. Cost ranges from $17-$37 Edema Control - Lymphedema / SCD / Other Bilateral Lower Extremities Lymphedema Pumps. Use Lymphedema pumps on leg(s) 2-3 times a day for 45-60 minutes. If wearing any wraps or hose, do not remove them. Continue exercising as instructed. - Keep using the Lymphadema Pumps.You may place the th Lyphadema pumps over the compression wraps. Elevate legs to the level of the heart or above for 30 minutes daily and/or when sitting for 3-4 times a day throughout the day. Avoid standing for long periods of time. Patient to wear own compression stockings every day. - Left leg Exercise regularly - As tolerated Off-Loading Other: - Keep legs elevated to heart level or above when sitting. You may place a pillow behind the calf for comfort, if so desired. Wound Treatment Wound #1 - Lower Leg Wound  Laterality: Right, Lateral Cleanser: Soap and Water 1 x Per Week/30 Days Discharge Instructions: May shower and wash wound with dial antibacterial soap and water prior to dressing change. Cleanser: Vashe 5.8 (oz) 1 x Per Week/30 Days Discharge Instructions: Cleanse the wound with Vashe prior to applying a clean dressing using gauze sponges, not tissue or cotton balls. Peri-Wound Care: Triamcinolone 15 (g) 1 x Per Week/30 Days Discharge Instructions: Use triamcinolone 15 (g) as directed Peri-Wound Care: Sween Lotion (Moisturizing lotion) 1 x Per Week/30 Days Discharge Instructions: Apply moisturizing lotion as directed Prim Dressing: Hydrofera Blue Ready Transfer Foam, 8x8 (in/in) 1 x Per Week/30 Days ary Discharge Instructions: Apply to wound bed as instructed Secondary Dressing: Woven Gauze Sponge, Non-Sterile 4x4 in 1 x Per Week/30 Days Discharge Instructions: Apply over primary dressing as directed. Compression Wrap: Urgo K2 Lite, (equivalent to a 3 layer) two layer compression system, regular 1 x Per Week/30 Days Discharge Instructions: Apply Urgo K2 Lite as directed (alternative to 3 layer compression). Compression Wrap: Stockinette 1 x Per Week/30 Days SAIGE, BUSBY ([id number]) [id number].pdf Page 4 of 7 Electronic Signature(s) Signed: 04/16/2023 4:26:34 PM By: [name] MD Signed: 04/16/2023 4:55:59 PM By: Brenton Grills Entered By: Brenton Grills on 04/16/2023 07:10:37 -------------------------------------------------------------------------------- Problem List Details Patient Name: Date of Service: [name] [name] Bulla Price. 04/16/2023 9:45 A M Medical Record Number: [id number] Patient Account Number: [id number] Date of Birth/Sex: Treating RN: [birth year] (52 y.o. [name] Primary Care Provider: Barbette Reichmann Other Clinician: Referring Provider: Treating Provider/Extender: Milas Kocher Weeks in Treatment:  11 Active Problems ICD-10 Encounter Code Description Active Date MDM Diagnosis L97.812 Non-pressure chronic ulcer of other part of right lower leg with fat layer 01/25/2023 No Yes exposed L97.822 Non-pressure chronic ulcer of other part of left lower leg with fat layer exposed7/06/2023 No Yes E11.622 Type 2 diabetes mellitus with other skin ulcer 01/25/2023 No Yes I87.313 Chronic venous hypertension (idiopathic) with ulcer of bilateral lower extremity 01/25/2023 No Yes Inactive Problems Resolved Problems Electronic Signature(s) Signed: 04/16/2023 4:26:34 PM By: [name] MD Entered By: [name] on 04/16/2023  07:08:07 -------------------------------------------------------------------------------- Progress Note Details Patient Name: Date of Service: [name] [name]. 04/16/2023 9:45 A M Medical Record Number: [id number] Patient Account Number: [id number] Date of Birth/Sex: Treating RN: 03/24/1971 (51 y.o. M) Primary Care Provider: Barbette Reichmann Other Clinician: Referring Provider: Treating Provider/Extender: Imagene Sheller in Treatment: [address] Trequan Price ([id number]) [id number] Page 5 of 7 History of Present Illness (HPI) 01/25/2023 [name] is a 52 year old male with a past medical history of controlled type 2 diabetes on insulin, venous insufficiency and DVT that presents to the clinic for bilateral lower extremity wounds. He states that the wound on the left was started by a bug bite and has been present for the past 4 months. The right lower extremity wound he states started out as a soft tissue infection and has been present for the past 6 years. He has been treated by a wound care center in [place] [facility] in [place]. He was last seen in the wound care center 2 years ago Could not continue following up due to lack of transportation. He states that recently the rehab  center has been wrapping his legs with compression wrap and a wound dressing underneath. It is unclear when the last time this happened was. He is not wearing any compression today. He currently denies signs of infection. 7/19; patient presents for follow-up. We have been using antibiotic ointment with Hydrofera Blue under 3 layer compression to the lower extremities bilaterally. Wounds are smaller. 7/30; patient presents for follow-up. We have been using antibiotic ointment and Hydrofera Blue under 3 layer compression to the lower extremities bilaterally. Wounds are smaller. 8/6; patient presents for follow-up. We have been using antibiotic ointment with Hydrofera Blue under 3 layer compression to the lower extremities bilaterally. Wounds are stable. He denies signs of infection. 10/1; the patient has not been here for 3 weeks. He completed his course of Apligraf to the wound on the right lower extremity. Both legs were wrapped for 3 weeks. Fortunately the area on the left lower leg is healed however the right leg had debris on the surface and maceration but fortunately no evidence of infection. He reminds me that he had an extensive surgery on this area apparently several years ago by Dr. Lajoyce Corners for a "staph infection". He has compression stockings from elastic therapy in [place] he got last May he says they are relatively new he is hardly worn them. 8/15; patient presents for follow-up. We have been using collagen to the left lower extremity and PolyMem silver to the right lower extremity all under 3 layer compression. Wounds are slightly smaller today. Patient has been approved for Apligraf and he was agreeable to having this placed in office. 8/20; patient presents for follow-up. At last clinic visit we used Apligraf to the right lower extremity under 3 layer compression and collagen to the left lower extremity under 3 layer compression. Wounds are smaller. 8/27; Patient presents for follow-up.  We have been using Apligraf to the right lower extremity wound and collagen to the left lower extremity wound under 3 layer compression. Overall wounds appear well-healing. He has no issues or complaints. 9/30; patient presents for follow-up. We have been using Apligraf to the right lower extremity wound and switch to PolyMem to the left lower extremity wound. All under 3 layer compression. Wounds are well-healing. 9/10; patient presents for follow-up. We have been using Apligraf to the right lower extremity wound and PolyMem silver to the left lower  [name], [name] ([id number]) [id number] Page 1 of 7 Visit Report for 04/16/2023 Debridement Details Patient Name: Date of Service: [name] [name]. 04/16/2023 9:45 A M Medical Record Number: [id number] Patient Account Number: [id number] Date of Birth/Sex: Treating RN: 04/12/1971 (52 y.o. M) Primary Care Provider: Barbette Reichmann Other Clinician: Referring Provider: Treating Provider/Extender: Milas Kocher Weeks in Treatment: 11 Debridement Performed for Assessment: Wound #1 Right,Lateral Lower Leg Performed By: Physician Maxwell Caul., MD The following information was scribed by: Brenton Grills The information was scribed for: [name] Debridement Type: Debridement Severity of Tissue Pre Debridement: Fat layer exposed Level of Consciousness (Pre-procedure): Awake and Alert Pre-procedure Verification/Time Out Yes - 10:00 Taken: Start Time: 10:01 Pain Control: Lidocaine 4% T opical Solution Percent of Wound Bed Debrided: 100% T Area Debrided (cm): otal 9.42 Tissue and other material debrided: Non-Viable, Slough, Subcutaneous, Slough Level: Skin/Subcutaneous Tissue Debridement Description: Excisional Instrument: Curette Bleeding: Moderate Hemostasis Achieved: Silver Nitrate End Time: 10:03 Response to Treatment: Procedure was tolerated well Level of Consciousness (Post- Awake and Alert procedure): Post Debridement Measurements of Total Wound Length: (cm) 6 Width: (cm) 2 Depth: (cm) 0.4 Volume: (cm) 3.77 Character of Wound/Ulcer Post Debridement: Stable Severity of Tissue Post Debridement: Fat layer exposed Post Procedure Diagnosis Same as Pre-procedure Electronic Signature(s) Signed: 04/16/2023 4:26:34 PM By: [name] MD Entered By: [name] on 04/16/2023 07:09:06 -------------------------------------------------------------------------------- HPI Details Patient Name: Date of  Service: [name] [name] Bulla Price. 04/16/2023 9:45 A M Medical Record Number: [id number] Patient Account Number: [id number] Date of Birth/Sex: Treating RN: Feb 26, 1971 (52 y.o. M) Primary Care Provider: Barbette Reichmann Other Clinician: Referring Provider: Treating Provider/Extender: Imagene Sheller in Treatment: [address] [name] ([id number]) [id number] Page 2 of 7 History of Present Illness HPI Description: 01/25/2023 [name] is a 52 year old male with a past medical history of controlled type 2 diabetes on insulin, venous insufficiency and DVT that presents to the clinic for bilateral lower extremity wounds. He states that the wound on the left was started by a bug bite and has been present for the past 4 months. The right lower extremity wound he states started out as a soft tissue infection and has been present for the past 6 years. He has been treated by a wound care center in [place] [facility] in [place]. He was last seen in the wound care center 2 years ago Could not continue following up due to lack of transportation. He states that recently the rehab center has been wrapping his legs with compression wrap and a wound dressing underneath. It is unclear when the last time this happened was. He is not wearing any compression today. He currently denies signs of infection. 7/19; patient presents for follow-up. We have been using antibiotic ointment with Hydrofera Blue under 3 layer compression to the lower extremities bilaterally. Wounds are smaller. 7/30; patient presents for follow-up. We have been using antibiotic ointment and Hydrofera Blue under 3 layer compression to the lower extremities bilaterally. Wounds are smaller. 8/6; patient presents for follow-up. We have been using antibiotic ointment with Hydrofera Blue under 3 layer compression to the lower extremities bilaterally. Wounds are stable. He denies  signs of infection. 10/1; the patient has not been here for 3 weeks. He completed his course of Apligraf to the wound on the right lower extremity. Both legs were wrapped for 3 weeks. Fortunately the area on the left lower leg is healed however the right leg had debris  bed Cellular or Tissue Based Products Wound #1 Right,Lateral Lower Leg Cellular or Tissue Based Product Type: - Apligraf #1 applied 02/28/23 Apligraf #2 applied 03/05/2023 Apligraf #3 applied 03/12/2023 Apligraf #4 applied 03/19/2023 Apligraf #5 applied on 03/26/2023 Bathing/ Shower/ Hygiene May shower with protection but do not get wound dressing(s) wet. Protect dressing(s) with water repellant cover (for example, large plastic bag) or a cast cover and may then take shower. - Please do not get the legs wet. Please use cast protectors when showering. Cast protectors can be purchased from [employer], Medical supply store. Cost ranges from $17-$37 Edema Control - Lymphedema / SCD / Other Bilateral Lower Extremities Lymphedema Pumps. Use Lymphedema pumps on leg(s) 2-3 times a day for 45-60 minutes. If wearing any wraps or hose, do not remove them. Continue exercising as instructed. - Keep using the Lymphadema Pumps.You may place the th Lyphadema pumps over the compression wraps. Elevate legs to the level of the heart or above for 30 minutes daily and/or when sitting for 3-4 times a day throughout the day. Avoid standing for long periods of time. Patient to wear own compression stockings every day. - Left leg Exercise regularly - As tolerated Off-Loading Other: - Keep legs elevated to heart level or above when sitting. You may place a pillow behind the calf for comfort, if so desired. Wound Treatment Wound #1 - Lower Leg Wound  Laterality: Right, Lateral Cleanser: Soap and Water 1 x Per Week/30 Days Discharge Instructions: May shower and wash wound with dial antibacterial soap and water prior to dressing change. Cleanser: Vashe 5.8 (oz) 1 x Per Week/30 Days Discharge Instructions: Cleanse the wound with Vashe prior to applying a clean dressing using gauze sponges, not tissue or cotton balls. Peri-Wound Care: Triamcinolone 15 (g) 1 x Per Week/30 Days Discharge Instructions: Use triamcinolone 15 (g) as directed Peri-Wound Care: Sween Lotion (Moisturizing lotion) 1 x Per Week/30 Days Discharge Instructions: Apply moisturizing lotion as directed Prim Dressing: Hydrofera Blue Ready Transfer Foam, 8x8 (in/in) 1 x Per Week/30 Days ary Discharge Instructions: Apply to wound bed as instructed Secondary Dressing: Woven Gauze Sponge, Non-Sterile 4x4 in 1 x Per Week/30 Days Discharge Instructions: Apply over primary dressing as directed. Compression Wrap: Urgo K2 Lite, (equivalent to a 3 layer) two layer compression system, regular 1 x Per Week/30 Days Discharge Instructions: Apply Urgo K2 Lite as directed (alternative to 3 layer compression). Compression Wrap: Stockinette 1 x Per Week/30 Days SAIGE, BUSBY ([id number]) [id number].pdf Page 4 of 7 Electronic Signature(s) Signed: 04/16/2023 4:26:34 PM By: [name] MD Signed: 04/16/2023 4:55:59 PM By: Brenton Grills Entered By: Brenton Grills on 04/16/2023 07:10:37 -------------------------------------------------------------------------------- Problem List Details Patient Name: Date of Service: [name] [name] Bulla Price. 04/16/2023 9:45 A M Medical Record Number: [id number] Patient Account Number: [id number] Date of Birth/Sex: Treating RN: [birth year] (52 y.o. [name] Primary Care Provider: Barbette Reichmann Other Clinician: Referring Provider: Treating Provider/Extender: Milas Kocher Weeks in Treatment:  11 Active Problems ICD-10 Encounter Code Description Active Date MDM Diagnosis L97.812 Non-pressure chronic ulcer of other part of right lower leg with fat layer 01/25/2023 No Yes exposed L97.822 Non-pressure chronic ulcer of other part of left lower leg with fat layer exposed7/06/2023 No Yes E11.622 Type 2 diabetes mellitus with other skin ulcer 01/25/2023 No Yes I87.313 Chronic venous hypertension (idiopathic) with ulcer of bilateral lower extremity 01/25/2023 No Yes Inactive Problems Resolved Problems Electronic Signature(s) Signed: 04/16/2023 4:26:34 PM By: [name] MD Entered By: [name] on 04/16/2023  to a course of 5 Apligraf's is not really clear. 1 would wonder about a PCR culture. This is also an area where he apparently had scar tissue from previous surgery that may be the issue. 3. The left leg is healed we discharged him to his own stockings from elastic therapy 4. Caution to not leave compression wraps on for 3 weeks Electronic Signature(s) Signed: 04/16/2023 4:26:34 PM By: [name] MD Entered By: [name] on 04/16/2023 07:16:07 [name] ([id number]) [id number] Page 7 of 7 -------------------------------------------------------------------------------- SuperBill Details Patient Name: Date of Service: [name] [name] 04/16/2023 Medical Record Number: [id number] Patient Account Number: [id number] Date of Birth/Sex: Treating RN: July 09, 1971 (51 y.o. [name] Primary Care Provider: Barbette Reichmann Other Clinician: Referring Provider: Treating Provider/Extender: Milas Kocher Weeks in Treatment: 11 Diagnosis Coding ICD-10 Codes Code Description [phone] Non-pressure chronic ulcer of other part of right lower leg with fat layer exposed L97.822 Non-pressure chronic ulcer of other part of left lower leg with fat layer exposed E11.622 Type 2 diabetes mellitus with other skin ulcer I87.313 Chronic venous hypertension (idiopathic) with ulcer of bilateral lower extremity Facility  Procedures : CPT4 Code: [id number] Description: 11042 - DEB SUBQ TISSUE 20 SQ CM/< ICD-10 Diagnosis Description L97.812 Non-pressure chronic ulcer of other part of right lower leg with fat layer exp Modifier: osed Quantity: 1 Physician Procedures : CPT4 Code Description Modifier [id number] 11042 - WC PHYS SUBQ TISS 20 SQ CM ICD-10 Diagnosis Description L97.812 Non-pressure chronic ulcer of other part of right lower leg with fat layer exposed Quantity: 1 Electronic Signature(s) Signed: 04/16/2023 4:26:34 PM By: [name] MD Entered By: [name] on 04/16/2023 07:17:39  [name], [name] ([id number]) [id number] Page 1 of 7 Visit Report for 04/16/2023 Debridement Details Patient Name: Date of Service: [name] [name]. 04/16/2023 9:45 A M Medical Record Number: [id number] Patient Account Number: [id number] Date of Birth/Sex: Treating RN: 04/12/1971 (52 y.o. M) Primary Care Provider: Barbette Reichmann Other Clinician: Referring Provider: Treating Provider/Extender: Milas Kocher Weeks in Treatment: 11 Debridement Performed for Assessment: Wound #1 Right,Lateral Lower Leg Performed By: Physician Maxwell Caul., MD The following information was scribed by: Brenton Grills The information was scribed for: [name] Debridement Type: Debridement Severity of Tissue Pre Debridement: Fat layer exposed Level of Consciousness (Pre-procedure): Awake and Alert Pre-procedure Verification/Time Out Yes - 10:00 Taken: Start Time: 10:01 Pain Control: Lidocaine 4% T opical Solution Percent of Wound Bed Debrided: 100% T Area Debrided (cm): otal 9.42 Tissue and other material debrided: Non-Viable, Slough, Subcutaneous, Slough Level: Skin/Subcutaneous Tissue Debridement Description: Excisional Instrument: Curette Bleeding: Moderate Hemostasis Achieved: Silver Nitrate End Time: 10:03 Response to Treatment: Procedure was tolerated well Level of Consciousness (Post- Awake and Alert procedure): Post Debridement Measurements of Total Wound Length: (cm) 6 Width: (cm) 2 Depth: (cm) 0.4 Volume: (cm) 3.77 Character of Wound/Ulcer Post Debridement: Stable Severity of Tissue Post Debridement: Fat layer exposed Post Procedure Diagnosis Same as Pre-procedure Electronic Signature(s) Signed: 04/16/2023 4:26:34 PM By: [name] MD Entered By: [name] on 04/16/2023 07:09:06 -------------------------------------------------------------------------------- HPI Details Patient Name: Date of  Service: [name] [name] Bulla Price. 04/16/2023 9:45 A M Medical Record Number: [id number] Patient Account Number: [id number] Date of Birth/Sex: Treating RN: Feb 26, 1971 (52 y.o. M) Primary Care Provider: Barbette Reichmann Other Clinician: Referring Provider: Treating Provider/Extender: Imagene Sheller in Treatment: [address] [name] ([id number]) [id number] Page 2 of 7 History of Present Illness HPI Description: 01/25/2023 [name] is a 52 year old male with a past medical history of controlled type 2 diabetes on insulin, venous insufficiency and DVT that presents to the clinic for bilateral lower extremity wounds. He states that the wound on the left was started by a bug bite and has been present for the past 4 months. The right lower extremity wound he states started out as a soft tissue infection and has been present for the past 6 years. He has been treated by a wound care center in [place] [facility] in [place]. He was last seen in the wound care center 2 years ago Could not continue following up due to lack of transportation. He states that recently the rehab center has been wrapping his legs with compression wrap and a wound dressing underneath. It is unclear when the last time this happened was. He is not wearing any compression today. He currently denies signs of infection. 7/19; patient presents for follow-up. We have been using antibiotic ointment with Hydrofera Blue under 3 layer compression to the lower extremities bilaterally. Wounds are smaller. 7/30; patient presents for follow-up. We have been using antibiotic ointment and Hydrofera Blue under 3 layer compression to the lower extremities bilaterally. Wounds are smaller. 8/6; patient presents for follow-up. We have been using antibiotic ointment with Hydrofera Blue under 3 layer compression to the lower extremities bilaterally. Wounds are stable. He denies  signs of infection. 10/1; the patient has not been here for 3 weeks. He completed his course of Apligraf to the wound on the right lower extremity. Both legs were wrapped for 3 weeks. Fortunately the area on the left lower leg is healed however the right leg had debris

## 2023-04-16 NOTE — Progress Notes (Signed)
Signature(s) Signed: 04/16/2023 4:55:59 PM By: Brenton Grills Entered By: Brenton Grills on 04/16/2023 06:40:29 -------------------------------------------------------------------------------- Multi Wound Chart Details Patient Name: Date of Service: [name] [name] Bulla K. 04/16/2023 9:45 A M Medical Record Number: [id number] Patient Account Number: [id number] Date of Birth/Sex: Treating RN: 09/15/1970 (52 y.o. M) Primary Care Aften Lipsey: Barbette Reichmann Other Clinician: Referring Theo Reither: Treating Koury Roddy/Extender: Milas Kocher Weeks in Treatment: 11 Vital Signs Height(in): 70 Pulse(bpm): 62 Weight(lbs): 264 Blood Pressure(mmHg): 119/75 Body Mass Index(BMI): 37.9 Temperature(F): 98.4 Respiratory Rate(breaths/min): 18 [1:Photos:] [N/A:N/A] Right, Lateral Lower Leg Left, Posterior Lower Leg N/A Wound Location: Other Lesion Bite N/A Wounding Event: Diabetic Wound/Ulcer of the Lower Diabetic Wound/Ulcer of the Lower N/A Primary Etiology: Extremity Extremity Anemia, Deep Vein Thrombosis, Anemia, Deep Vein Thrombosis, N/A Comorbid  History: Hypertension, Peripheral Venous Hypertension, Peripheral Venous Disease, Type II Diabetes, Gout Disease, Type II Diabetes, Gout 01/01/2018 09/14/2022 N/A Date Acquired: 11 11 N/A Weeks of Treatment: Open Healed - Epithelialized N/A Wound Status: No No N/A Wound Recurrence: 6x2x0.4 0x0x0 N/A Measurements L x W x D (cm) 9.425 0 N/A A (cm) : rea 3.77 0 N/A Volume (cm) : 27.90% 100.00% N/A % Reduction in A rea: 27.90% 100.00% N/A % Reduction in Volume: Grade 2 Grade 1 N/A Classification: Medium None Present N/A Exudate A mount: Serosanguineous N/A N/A Exudate Type: red, brown N/A N/A Exudate Color: Thickened Distinct, outline attached N/A Wound Margin: Large (67-100%) None Present (0%) N/A Granulation A mount: Red, Pink N/A N/A Granulation Quality: Small (1-33%) Large (67-100%) N/A Necrotic A mount: Eschar Eschar N/A Necrotic Tissue: Fat Layer (Subcutaneous Tissue): Yes Fascia: No N/A Exposed Structures: Fascia: No Fat Layer (Subcutaneous Tissue): No Tendon: No Tendon: No Muscle: No Muscle: No Joint: No Joint: No Bone: No Bone: No Small (1-33%) Large (67-100%) N/A Epithelialization: Debridement - Selective/Open Wound N/A N/A Debridement: Pre-procedure Verification/Time Out 10:00 N/A N/A Taken: Lidocaine 4% Topical Solution N/A N/A Pain Control: Slough N/A N/A Tissue Debrided: Non-Viable Tissue N/A N/A Level: 9.42 N/A N/A Debridement A (sq cm): rea Curette N/A N/A InstrumentMICHAEAL, DAVIS ([id number]) [id number] Page 4 of 9 Moderate N/A N/A Bleeding: Silver Nitrate N/A N/A Hemostasis Achieved: Procedure was tolerated well N/A N/A Debridement Treatment Response: 6x2x0.4 N/A N/A Post Debridement Measurements L x W x D (cm) 3.77 N/A N/A Post Debridement Volume: (cm) Excoriation: No Excoriation: No N/A Periwound Skin Texture: Induration: No Induration: No Callus: No Callus: No Crepitus: No Crepitus:  No Rash: No Rash: No Scarring: No Scarring: No Maceration: No Maceration: No N/A Periwound Skin Moisture: Dry/Scaly: No Dry/Scaly: No Hemosiderin Staining: Yes Hemosiderin Staining: Yes N/A Periwound Skin Color: Atrophie Blanche: No Atrophie Blanche: No Cyanosis: No Cyanosis: No Ecchymosis: No Ecchymosis: No Erythema: No Erythema: No Mottled: No Mottled: No Pallor: No Pallor: No Rubor: No Rubor: No No Abnormality No Abnormality N/A Temperature: Compression Therapy N/A N/A Procedures Performed: Debridement Treatment Notes Electronic Signature(s) Signed: 04/16/2023 4:26:34 PM By: [name] MD Entered By: [name] on 04/16/2023 01:02:72 -------------------------------------------------------------------------------- Multi-Disciplinary Care Plan Details Patient Name: Date of Service: [name] [name] Bulla K. 04/16/2023 9:45 A M Medical Record Number: [id number] Patient Account Number: [id number] Date of Birth/Sex: Treating RN: [birth year] (52 y.o. [name] Primary Care Yarelly Kuba: Barbette Reichmann Other Clinician: Referring Ying Blankenhorn: Treating Souleymane Saiki/Extender: Milas Kocher Weeks in Treatment: 11 Active Inactive Wound/Skin Impairment Nursing Diagnoses: Impaired tissue integrity Goals: Patient/caregiver will verbalize understanding of skin care regimen Date Initiated: 01/25/2023 Target Resolution Date: 05/16/2023 Goal Status: Active  Interventions: Assess ulceration(s) every visit Treatment Activities: Skin care regimen initiated : 01/25/2023 Notes: Electronic Signature(s) Signed: 04/16/2023 4:55:59 PM By: Brenton Grills Entered By: Brenton Grills on 04/16/2023 06:44:38 [name] ([id number]) [id number] Page 5 of 9 -------------------------------------------------------------------------------- Pain Assessment Details Patient Name: Date of Service: [name] [name] Allegra. 04/16/2023 9:45 A  M Medical Record Number: [id number] Patient Account Number: [id number] Date of Birth/Sex: Treating RN: 1971-03-24 (52 y.o. [name] Primary Care Zoran Yankee: Barbette Reichmann Other Clinician: Referring Ysabel Stankovich: Treating Finesse Fielder/Extender: Milas Kocher Weeks in Treatment: 11 Active Problems Location of Pain Severity and Description of Pain Patient Has Paino No Site Locations Pain Management and Medication Current Pain Management: Electronic Signature(s) Signed: 04/16/2023 4:55:59 PM By: Brenton Grills Entered By: Brenton Grills on 04/16/2023 06:30:05 -------------------------------------------------------------------------------- Patient/Caregiver Education Details Patient Name: Date of Service: [name] [name] Allegra 10/1/2024andnbsp9:45 A M Medical Record Number: [id number] Patient Account Number: [id number] Date of Birth/Gender: Treating RN: [birth year] (52 y.o. [name] Primary Care Physician: Barbette Reichmann Other Clinician: Referring Physician: Treating Physician/Extender: Imagene Sheller in Treatment: 11 Education Assessment Education Provided To: Patient Education Topics Provided Wound/Skin Impairment: Methods: Explain/Verbal Responses: State content correctly [name], [name] ([id number]) [id number] Page 6 of 9 Electronic Signature(s) Signed: 04/16/2023 4:55:59 PM By: Brenton Grills Entered By: Brenton Grills on 04/16/2023 06:47:24 -------------------------------------------------------------------------------- Wound Assessment Details Patient Name: Date of Service: [name] [name] Bulla K. 04/16/2023 9:45 A M Medical Record Number: [id number] Patient Account Number: [id number] Date of Birth/Sex: Treating RN: June 15, 1971 (52 y.o. [name] Primary Care Akiko Schexnider: Barbette Reichmann Other Clinician: Referring Darwin Guastella: Treating Julianne Chamberlin/Extender: Milas Kocher Weeks in Treatment: 11 Wound Status Wound Number: 1 Primary Diabetic Wound/Ulcer of the Lower Extremity Etiology: Wound Location: Right, Lateral Lower Leg Wound Open Wounding Event: Other Lesion Status: Date Acquired: 01/01/2018 Notes: Pt. 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 Weeks Of Treatment: 11 Comorbid Anemia, Deep Vein Thrombosis, Hypertension, Peripheral Venous Clustered Wound: No History: Disease, Type II Diabetes, Gout Photos Wound Measurements Length: (cm) 6 Width: (cm) 2 Depth: (cm) 0.4 Area: (cm) 9.425 Volume: (cm) 3.77 % Reduction in Area: 27.9% % Reduction in Volume: 27.9% Epithelialization: Small (1-33%) Tunneling: No Undermining: No Wound Description Classification: Grade 2 Wound Margin: Thickened Exudate Amount: Medium Exudate Type: Serosanguineous Exudate Color: red, brown Foul Odor After Cleansing: No Slough/Fibrino Yes Wound Bed Granulation Amount: Large (67-100%) Exposed Structure Granulation Quality: Red, Pink Fascia Exposed: No Necrotic Amount: Small (1-33%) Fat Layer (Subcutaneous Tissue) Exposed: Yes Necrotic Quality: Eschar Tendon Exposed: No Muscle Exposed: No Joint Exposed: No Bone Exposed: No Periwound Skin Texture Texture Color No Abnormalities Noted: No No Abnormalities Noted: No Callus: No Atrophie Blanche: No Crepitus: No Cyanosis: No [name], [name] ([id number]) [id number].pdf Page 7 of 9 Excoriation: No Ecchymosis: No Induration: No Erythema: No Rash: No Hemosiderin Staining: Yes Scarring: No Mottled: No Pallor: No Moisture Rubor: No No Abnormalities Noted: No Dry / Scaly: No Temperature / Pain Maceration: No Temperature: No Abnormality Treatment Notes Wound #1 (Lower Leg) Wound Laterality: Right, Lateral Cleanser Soap and Water Discharge Instruction: May shower and wash wound with dial antibacterial soap and water prior to dressing change. Vashe 5.8  (oz) Discharge Instruction: Cleanse the wound with Vashe prior to applying a clean dressing using gauze sponges, not tissue or cotton balls. Peri-Wound Care Triamcinolone 15 (g) Discharge Instruction: Use triamcinolone 15 (g) as directed Sween Lotion (Moisturizing lotion) Discharge Instruction: Apply moisturizing lotion as directed Topical  Interventions: Assess ulceration(s) every visit Treatment Activities: Skin care regimen initiated : 01/25/2023 Notes: Electronic Signature(s) Signed: 04/16/2023 4:55:59 PM By: Brenton Grills Entered By: Brenton Grills on 04/16/2023 06:44:38 [name] ([id number]) [id number] Page 5 of 9 -------------------------------------------------------------------------------- Pain Assessment Details Patient Name: Date of Service: [name] [name] Allegra. 04/16/2023 9:45 A  M Medical Record Number: [id number] Patient Account Number: [id number] Date of Birth/Sex: Treating RN: 1971-03-24 (52 y.o. [name] Primary Care Zoran Yankee: Barbette Reichmann Other Clinician: Referring Ysabel Stankovich: Treating Finesse Fielder/Extender: Milas Kocher Weeks in Treatment: 11 Active Problems Location of Pain Severity and Description of Pain Patient Has Paino No Site Locations Pain Management and Medication Current Pain Management: Electronic Signature(s) Signed: 04/16/2023 4:55:59 PM By: Brenton Grills Entered By: Brenton Grills on 04/16/2023 06:30:05 -------------------------------------------------------------------------------- Patient/Caregiver Education Details Patient Name: Date of Service: [name] [name] Allegra 10/1/2024andnbsp9:45 A M Medical Record Number: [id number] Patient Account Number: [id number] Date of Birth/Gender: Treating RN: [birth year] (52 y.o. [name] Primary Care Physician: Barbette Reichmann Other Clinician: Referring Physician: Treating Physician/Extender: Imagene Sheller in Treatment: 11 Education Assessment Education Provided To: Patient Education Topics Provided Wound/Skin Impairment: Methods: Explain/Verbal Responses: State content correctly [name], [name] ([id number]) [id number] Page 6 of 9 Electronic Signature(s) Signed: 04/16/2023 4:55:59 PM By: Brenton Grills Entered By: Brenton Grills on 04/16/2023 06:47:24 -------------------------------------------------------------------------------- Wound Assessment Details Patient Name: Date of Service: [name] [name] Bulla K. 04/16/2023 9:45 A M Medical Record Number: [id number] Patient Account Number: [id number] Date of Birth/Sex: Treating RN: June 15, 1971 (52 y.o. [name] Primary Care Akiko Schexnider: Barbette Reichmann Other Clinician: Referring Darwin Guastella: Treating Julianne Chamberlin/Extender: Milas Kocher Weeks in Treatment: 11 Wound Status Wound Number: 1 Primary Diabetic Wound/Ulcer of the Lower Extremity Etiology: Wound Location: Right, Lateral Lower Leg Wound Open Wounding Event: Other Lesion Status: Date Acquired: 01/01/2018 Notes: Pt. 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 Weeks Of Treatment: 11 Comorbid Anemia, Deep Vein Thrombosis, Hypertension, Peripheral Venous Clustered Wound: No History: Disease, Type II Diabetes, Gout Photos Wound Measurements Length: (cm) 6 Width: (cm) 2 Depth: (cm) 0.4 Area: (cm) 9.425 Volume: (cm) 3.77 % Reduction in Area: 27.9% % Reduction in Volume: 27.9% Epithelialization: Small (1-33%) Tunneling: No Undermining: No Wound Description Classification: Grade 2 Wound Margin: Thickened Exudate Amount: Medium Exudate Type: Serosanguineous Exudate Color: red, brown Foul Odor After Cleansing: No Slough/Fibrino Yes Wound Bed Granulation Amount: Large (67-100%) Exposed Structure Granulation Quality: Red, Pink Fascia Exposed: No Necrotic Amount: Small (1-33%) Fat Layer (Subcutaneous Tissue) Exposed: Yes Necrotic Quality: Eschar Tendon Exposed: No Muscle Exposed: No Joint Exposed: No Bone Exposed: No Periwound Skin Texture Texture Color No Abnormalities Noted: No No Abnormalities Noted: No Callus: No Atrophie Blanche: No Crepitus: No Cyanosis: No [name], [name] ([id number]) [id number].pdf Page 7 of 9 Excoriation: No Ecchymosis: No Induration: No Erythema: No Rash: No Hemosiderin Staining: Yes Scarring: No Mottled: No Pallor: No Moisture Rubor: No No Abnormalities Noted: No Dry / Scaly: No Temperature / Pain Maceration: No Temperature: No Abnormality Treatment Notes Wound #1 (Lower Leg) Wound Laterality: Right, Lateral Cleanser Soap and Water Discharge Instruction: May shower and wash wound with dial antibacterial soap and water prior to dressing change. Vashe 5.8  (oz) Discharge Instruction: Cleanse the wound with Vashe prior to applying a clean dressing using gauze sponges, not tissue or cotton balls. Peri-Wound Care Triamcinolone 15 (g) Discharge Instruction: Use triamcinolone 15 (g) as directed Sween Lotion (Moisturizing lotion) Discharge Instruction: Apply moisturizing lotion as directed Topical  [name], [name] ([id number]) [id number] Page 1 of 9 Visit Report for 04/16/2023 Arrival Information Details Patient Name: Date of Service: [name] [name] Allegra. 04/16/2023 9:45 A M Medical Record Number: [id number] Patient Account Number: [id number] Date of Birth/Sex: Treating RN: 11-07-1970 (52 y.o. [name] Primary Care Braxten Memmer: Barbette Reichmann Other Clinician: Referring Rebecah Dangerfield: Treating Shakeeta Godette/Extender: Imagene Sheller in Treatment: 11 Visit Information History Since Last Visit All ordered tests and consults were completed: Yes Patient Arrived: Ambulatory Added or deleted any medications: No Arrival Time: 09:27 Any new allergies or adverse reactions: No Accompanied By: self Had a fall or experienced change in No Transfer Assistance: None activities of daily living that may affect Patient Identification Verified: Yes risk of falls: Secondary Verification Process Completed: Yes Signs or symptoms of abuse/neglect since last visito No Patient Requires Transmission-Based Precautions: No Hospitalized since last visit: No Patient Has Alerts: No Implantable device outside of the clinic excluding No cellular tissue based products placed in the center since last visit: Has Dressing in Place as Prescribed: Yes Pain Present Now: No Electronic Signature(s) Signed: 04/16/2023 4:55:59 PM By: Brenton Grills Entered By: Brenton Grills on 04/16/2023 06:27:30 -------------------------------------------------------------------------------- Compression Therapy Details Patient Name: Date of Service: [name] [name] Bulla K. 04/16/2023 9:45 A M Medical Record Number: [id number] Patient Account Number: [id number] Date of Birth/Sex: Treating RN: 07-21-1970 (52 y.o. [name] Primary Care Carolle Ishii: Barbette Reichmann Other Clinician: Referring Analysia Dungee: Treating Breionna Punt/Extender: Milas Kocher Weeks in  Treatment: 11 Compression Therapy Performed for Wound Assessment: Wound #1 Right,Lateral Lower Leg Performed By: Clinician Brenton Grills, RN Compression Type: Three Layer Post Procedure Diagnosis Same as Pre-procedure Electronic Signature(s) Signed: 04/16/2023 4:55:59 PM By: Brenton Grills Entered By: Brenton Grills on 04/16/2023 07:01:11 [name] ([id number]) [id number] Page 2 of 9 -------------------------------------------------------------------------------- Encounter Discharge Information Details Patient Name: Date of Service: [name] [name] Allegra 04/16/2023 9:45 A M Medical Record Number: [id number] Patient Account Number: [id number] Date of Birth/Sex: Treating RN: 02-Sep-1970 (52 y.o. [name] Primary Care Quida Glasser: Barbette Reichmann Other Clinician: Referring Lakishia Bourassa: Treating Adlee Paar/Extender: Milas Kocher Weeks in Treatment: 11 Encounter Discharge Information Items Post Procedure Vitals Discharge Condition: Stable Temperature (F): 97.9 Ambulatory Status: Ambulatory Pulse (bpm): 84 Discharge Destination: Home Respiratory Rate (breaths/min): 18 Transportation: Private Auto Blood Pressure (mmHg): 118/70 Accompanied By: self Schedule Follow-up Appointment: Yes Clinical Summary of Care: Patient Declined Electronic Signature(s) Signed: 04/16/2023 4:55:59 PM By: Brenton Grills Entered By: Brenton Grills on 04/16/2023 07:13:40 -------------------------------------------------------------------------------- Lower Extremity Assessment Details Patient Name: Date of Service: [name] [name] Allegra. 04/16/2023 9:45 A M Medical Record Number: [id number] Patient Account Number: [id number] Date of Birth/Sex: Treating RN: Apr 03, 1971 (52 y.o. [name] Primary Care Lennin Osmond: Barbette Reichmann Other Clinician: Referring Manjot Beumer: Treating Abrahan Fulmore/Extender: Milas Kocher Weeks in  Treatment: 11 Edema Assessment Assessed: [Left: No] [Right: No] Edema: [Left: No] [Right: No] Calf Left: Right: Point of Measurement: 34 cm From Medial Instep 34.5 cm 33.4 cm Ankle Left: Right: Point of Measurement: 10 cm From Medial Instep 21.4 cm 21.3 cm Vascular Assessment Pulses: Dorsalis Pedis Palpable: [Left:Yes] [Right:Yes] Extremity colors, hair growth, and conditions: Extremity Color: [Left:Normal] [Right:Normal] Hair Growth on Extremity: [Left:No] [Right:No] Temperature of Extremity: [Left:Warm] [Right:Warm] Capillary Refill: [Left:< 3 seconds] [Right:< 3 seconds] Dependent Rubor: [Left:No No] [Right:No No] Toe Nail Assessment Left: Right: Thick: Yes Discolored: Yes Deformed: Yes Improper Length and HygieneERNST, [place] ([id number]) [id number] Page 3 of 9 Electronic

## 2023-04-23 ENCOUNTER — Encounter (HOSPITAL_BASED_OUTPATIENT_CLINIC_OR_DEPARTMENT_OTHER): Payer: Medicaid Other | Admitting: Internal Medicine

## 2023-04-23 DIAGNOSIS — L97822 Non-pressure chronic ulcer of other part of left lower leg with fat layer exposed: Secondary | ICD-10-CM | POA: Diagnosis not present

## 2023-04-23 DIAGNOSIS — I10 Essential (primary) hypertension: Secondary | ICD-10-CM | POA: Diagnosis not present

## 2023-04-23 DIAGNOSIS — Z794 Long term (current) use of insulin: Secondary | ICD-10-CM | POA: Diagnosis not present

## 2023-04-23 DIAGNOSIS — Z86718 Personal history of other venous thrombosis and embolism: Secondary | ICD-10-CM | POA: Diagnosis not present

## 2023-04-23 DIAGNOSIS — I872 Venous insufficiency (chronic) (peripheral): Secondary | ICD-10-CM | POA: Diagnosis not present

## 2023-04-23 DIAGNOSIS — L97812 Non-pressure chronic ulcer of other part of right lower leg with fat layer exposed: Secondary | ICD-10-CM | POA: Diagnosis not present

## 2023-04-23 DIAGNOSIS — E11622 Type 2 diabetes mellitus with other skin ulcer: Secondary | ICD-10-CM | POA: Diagnosis not present

## 2023-04-23 DIAGNOSIS — I87313 Chronic venous hypertension (idiopathic) with ulcer of bilateral lower extremity: Secondary | ICD-10-CM | POA: Diagnosis not present

## 2023-04-25 NOTE — Progress Notes (Signed)
DUTCH, ING ([id number]) [id number] Page 1 of 7 Visit Report for 04/23/2023 Debridement Details Patient Name: Date of Service: [name] [name]. 04/23/2023 9:30 A M Medical Record Number: [id number] Patient Account Number: [id number] Date of Birth/Sex: Treating RN: 06/23/1971 (52 y.o. M) Primary Care Provider: Barbette Reichmann Other Clinician: Referring Provider: Treating Provider/Extender: Milas Kocher Weeks in Treatment: 12 Debridement Performed for Assessment: Wound #1 Right,Lateral Lower Leg Performed By: Physician Maxwell Caul., MD The following information was scribed by: Brenton Grills The information was scribed for: [name] Debridement Type: Debridement Severity of Tissue Pre Debridement: Fat layer exposed Level of Consciousness (Pre-procedure): Awake and Alert Pre-procedure Verification/Time Out Yes - 10:05 Taken: Start Time: 10:06 Pain Control: Lidocaine 4% T opical Solution Percent of Wound Bed Debrided: 100% T Area Debrided (cm): otal 6.5 Tissue and other material debrided: Viable, Slough, Subcutaneous, Slough Level: Skin/Subcutaneous Tissue Debridement Description: Excisional Instrument: Curette Bleeding: Minimum Hemostasis Achieved: Pressure Response to Treatment: Procedure was tolerated well Level of Consciousness (Post- Awake and Alert procedure): Post Debridement Measurements of Total Wound Length: (cm) 4.6 Width: (cm) 1.8 Depth: (cm) 0.3 Volume: (cm) 1.951 Character of Wound/Ulcer Post Debridement: Improved Severity of Tissue Post Debridement: Fat layer exposed Post Procedure Diagnosis Same as Pre-procedure Electronic Signature(s) Signed: 04/24/2023 9:33:55 AM By: [name] MD Entered By: [name] on 04/23/2023 10:35:04 -------------------------------------------------------------------------------- HPI Details Patient Name: Date of Service: [name] [name] Bulla Price.  04/23/2023 9:30 A M Medical Record Number: [id number] Patient Account Number: [id number] Date of Birth/Sex: Treating RN: 04-Jun-1971 (52 y.o. M) Primary Care Provider: Barbette Reichmann Other Clinician: Referring Provider: Treating Provider/Extender: Imagene Sheller in Treatment: 12 History of Present Illness [name] ([id number]) [id number] Page 2 of 7 HPI Description: 01/25/2023 [name] is a 52 year old male with a past medical history of controlled type 2 diabetes on insulin, venous insufficiency and DVT that presents to the clinic for bilateral lower extremity wounds. He states that the wound on the left was started by a bug bite and has been present for the past 4 months. The right lower extremity wound he states started out as a soft tissue infection and has been present for the past 6 years. He has been treated by a wound care center in [facility] in [place]. He was last seen in the wound care center 2 years ago Could not continue following up due to lack of transportation. He states that recently the rehab center has been wrapping his legs with compression wrap and a wound dressing underneath. It is unclear when the last time this happened was. He is not wearing any compression today. He currently denies signs of infection. 7/19; patient presents for follow-up. We have been using antibiotic ointment with Hydrofera Blue under 3 layer compression to the lower extremities bilaterally. Wounds are smaller. 7/30; patient presents for follow-up. We have been using antibiotic ointment and Hydrofera Blue under 3 layer compression to the lower extremities bilaterally. Wounds are smaller. 8/6; patient presents for follow-up. We have been using antibiotic ointment with Hydrofera Blue under 3 layer compression to the lower extremities bilaterally. Wounds are stable. He denies signs of infection. 10/1; the  patient has not been here for 3 weeks. He completed his course of Apligraf to the wound on the right lower extremity. Both legs were wrapped for 3 weeks. Fortunately the area on the left lower leg is healed however the right leg had debris on the surface and  maceration but fortunately no evidence of infection. He reminds me that he had an extensive surgery on this area apparently several years ago by Dr. Lajoyce Corners for a "staph infection". He has compression stockings from elastic therapy in [place] he got last May he says they are relatively new he is hardly worn them. 8/15; patient presents for follow-up. We have been using collagen to the left lower extremity and PolyMem silver to the right lower extremity all under 3 layer compression. Wounds are slightly smaller today. Patient has been approved for Apligraf and he was agreeable to having this placed in office. 8/20; patient presents for follow-up. At last clinic visit we used Apligraf to the right lower extremity under 3 layer compression and collagen to the left lower extremity under 3 layer compression. Wounds are smaller. 8/27; Patient presents for follow-up. We have been using Apligraf to the right lower extremity wound and collagen to the left lower extremity wound under 3 layer compression. Overall wounds appear well-healing. He has no issues or complaints. 9/30; patient presents for follow-up. We have been using Apligraf to the right lower extremity wound and switch to PolyMem to the left lower extremity wound. All under 3 layer compression. Wounds are well-healing. 9/10; patient presents for follow-up. We have been using Apligraf to the right lower extremity wound and PolyMem silver to the left lower extremity wound all under 3 layer compression. Wounds are smaller. 10/8; last week we changed the primary dressing on the large right lower leg wound in the middle of scar tissue to Hydrofera Blue. He comes in today with a surface not looking  particularly healthy. ALSO he has had a reopening in the area of the original left calf wound apparently happened while he was washing off the wound area. He is put polymen silver on this and his own compression stocking since Electronic Signature(s) Signed: 04/24/2023 9:33:55 AM By: [name] MD Entered By: [name] on 04/23/2023 10:36:13 -------------------------------------------------------------------------------- Physical Exam Details Patient Name: Date of Service: [name] [name] Bulla Price. 04/23/2023 9:30 A M Medical Record Number: [id number] Patient Account Number: [id number] Date of Birth/Sex: Treating RN: 05/25/1971 (52 y.o. M) Primary Care Provider: Barbette Reichmann Other Clinician: Referring Provider: Treating Provider/Extender: Milas Kocher Weeks in Treatment: 12 Constitutional Sitting or standing Blood Pressure is within target range for patient.. Pulse regular and within target range for patient.Marland Kitchen Respirations regular, non-labored and within target range.. Temperature is normal and within the target range for the patient.Marland Kitchen Appears in no distress. Notes Wound exam; right lower extremity wound completely nonviable surface. Extensively debrided with a #3 curette removing adherent surface slough and subcutaneous tissue. I am going to continue the debridement using Iodoflex as the primary dressing On the left lower leg and the site of the original wound the small area of this is now open most of it still epithelialized it looks as though he has inflammation around this but no tenderness Electronic Signature(s) Signed: 04/24/2023 9:33:55 AM By: [name] MD Entered By: [name] on 04/23/2023 10:37:46 [name] ([id number]) [id number] Page 3 of 7 -------------------------------------------------------------------------------- Physician Orders Details Patient Name: Date of Service: [name] [name]. 04/23/2023 9:30 A M Medical Record Number: [id number] Patient Account Number: [id number] Date of Birth/Sex: Treating RN: 12-Sep-1970 (52 y.o. [name] Primary Care Provider: Barbette Reichmann Other Clinician: Referring Provider: Treating Provider/Extender: Milas Kocher Weeks in Treatment: 12 The following information was scribed by: Brenton Grills The information was scribed for: Leanord Hawking,  maceration but fortunately no evidence of infection. He reminds me that he had an extensive surgery on this area apparently several years ago by Dr. Lajoyce Corners for a "staph infection". He has compression stockings from elastic therapy in [place] he got last May he says they are relatively new he is hardly worn them. 8/15; patient presents for follow-up. We have been using collagen to the left lower extremity and PolyMem silver to the right lower extremity all under 3 layer compression. Wounds are slightly smaller today. Patient has been approved for Apligraf and he was agreeable to having this placed in office. 8/20; patient presents for follow-up. At last clinic visit we used Apligraf to the right lower extremity under 3 layer compression and collagen to the left lower extremity under 3 layer compression. Wounds are smaller. 8/27; Patient presents for follow-up. We have been using Apligraf to the right lower extremity wound and collagen to the left lower extremity wound under 3 layer compression. Overall wounds appear well-healing. He has no issues or complaints. 9/30; patient presents for follow-up. We have been using Apligraf to the right lower extremity wound and switch to PolyMem to the left lower extremity wound. All under 3 layer compression. Wounds are well-healing. 9/10; patient presents for follow-up. We have been using Apligraf to the right lower extremity wound and PolyMem silver to the left lower extremity wound all under 3 layer compression. Wounds are smaller. 10/8; last week we changed the primary dressing on the large right lower leg wound in the middle of scar tissue to Hydrofera Blue. He comes in today with a surface not looking  particularly healthy. ALSO he has had a reopening in the area of the original left calf wound apparently happened while he was washing off the wound area. He is put polymen silver on this and his own compression stocking since Electronic Signature(s) Signed: 04/24/2023 9:33:55 AM By: [name] MD Entered By: [name] on 04/23/2023 10:36:13 -------------------------------------------------------------------------------- Physical Exam Details Patient Name: Date of Service: [name] [name] Bulla Price. 04/23/2023 9:30 A M Medical Record Number: [id number] Patient Account Number: [id number] Date of Birth/Sex: Treating RN: 05/25/1971 (52 y.o. M) Primary Care Provider: Barbette Reichmann Other Clinician: Referring Provider: Treating Provider/Extender: Milas Kocher Weeks in Treatment: 12 Constitutional Sitting or standing Blood Pressure is within target range for patient.. Pulse regular and within target range for patient.Marland Kitchen Respirations regular, non-labored and within target range.. Temperature is normal and within the target range for the patient.Marland Kitchen Appears in no distress. Notes Wound exam; right lower extremity wound completely nonviable surface. Extensively debrided with a #3 curette removing adherent surface slough and subcutaneous tissue. I am going to continue the debridement using Iodoflex as the primary dressing On the left lower leg and the site of the original wound the small area of this is now open most of it still epithelialized it looks as though he has inflammation around this but no tenderness Electronic Signature(s) Signed: 04/24/2023 9:33:55 AM By: [name] MD Entered By: [name] on 04/23/2023 10:37:46 [name] ([id number]) [id number] Page 3 of 7 -------------------------------------------------------------------------------- Physician Orders Details Patient Name: Date of Service: [name] [name]. 04/23/2023 9:30 A M Medical Record Number: [id number] Patient Account Number: [id number] Date of Birth/Sex: Treating RN: 12-Sep-1970 (52 y.o. [name] Primary Care Provider: Barbette Reichmann Other Clinician: Referring Provider: Treating Provider/Extender: Milas Kocher Weeks in Treatment: 12 The following information was scribed by: Brenton Grills The information was scribed for: Leanord Hawking,  maceration but fortunately no evidence of infection. He reminds me that he had an extensive surgery on this area apparently several years ago by Dr. Lajoyce Corners for a "staph infection". He has compression stockings from elastic therapy in [place] he got last May he says they are relatively new he is hardly worn them. 8/15; patient presents for follow-up. We have been using collagen to the left lower extremity and PolyMem silver to the right lower extremity all under 3 layer compression. Wounds are slightly smaller today. Patient has been approved for Apligraf and he was agreeable to having this placed in office. 8/20; patient presents for follow-up. At last clinic visit we used Apligraf to the right lower extremity under 3 layer compression and collagen to the left lower extremity under 3 layer compression. Wounds are smaller. 8/27; Patient presents for follow-up. We have been using Apligraf to the right lower extremity wound and collagen to the left lower extremity wound under 3 layer compression. Overall wounds appear well-healing. He has no issues or complaints. 9/30; patient presents for follow-up. We have been using Apligraf to the right lower extremity wound and switch to PolyMem to the left lower extremity wound. All under 3 layer compression. Wounds are well-healing. 9/10; patient presents for follow-up. We have been using Apligraf to the right lower extremity wound and PolyMem silver to the left lower extremity wound all under 3 layer compression. Wounds are smaller. 10/8; last week we changed the primary dressing on the large right lower leg wound in the middle of scar tissue to Hydrofera Blue. He comes in today with a surface not looking  particularly healthy. ALSO he has had a reopening in the area of the original left calf wound apparently happened while he was washing off the wound area. He is put polymen silver on this and his own compression stocking since Electronic Signature(s) Signed: 04/24/2023 9:33:55 AM By: [name] MD Entered By: [name] on 04/23/2023 10:36:13 -------------------------------------------------------------------------------- Physical Exam Details Patient Name: Date of Service: [name] [name] Bulla Price. 04/23/2023 9:30 A M Medical Record Number: [id number] Patient Account Number: [id number] Date of Birth/Sex: Treating RN: 05/25/1971 (52 y.o. M) Primary Care Provider: Barbette Reichmann Other Clinician: Referring Provider: Treating Provider/Extender: Milas Kocher Weeks in Treatment: 12 Constitutional Sitting or standing Blood Pressure is within target range for patient.. Pulse regular and within target range for patient.Marland Kitchen Respirations regular, non-labored and within target range.. Temperature is normal and within the target range for the patient.Marland Kitchen Appears in no distress. Notes Wound exam; right lower extremity wound completely nonviable surface. Extensively debrided with a #3 curette removing adherent surface slough and subcutaneous tissue. I am going to continue the debridement using Iodoflex as the primary dressing On the left lower leg and the site of the original wound the small area of this is now open most of it still epithelialized it looks as though he has inflammation around this but no tenderness Electronic Signature(s) Signed: 04/24/2023 9:33:55 AM By: [name] MD Entered By: [name] on 04/23/2023 10:37:46 [name] ([id number]) [id number] Page 3 of 7 -------------------------------------------------------------------------------- Physician Orders Details Patient Name: Date of Service: [name] [name]. 04/23/2023 9:30 A M Medical Record Number: [id number] Patient Account Number: [id number] Date of Birth/Sex: Treating RN: 12-Sep-1970 (52 y.o. [name] Primary Care Provider: Barbette Reichmann Other Clinician: Referring Provider: Treating Provider/Extender: Milas Kocher Weeks in Treatment: 12 The following information was scribed by: Brenton Grills The information was scribed for: Leanord Hawking,  Vashe prior to applying a clean dressing using gauze sponges, not tissue or cotton balls. Peri-Wound Care: Triamcinolone 15 (g) 1 x Per Week/30 Days Discharge Instructions: Use triamcinolone 15 (g) as directed Peri-Wound Care: Sween Lotion (Moisturizing lotion) 1 x Per Week/30 Days Discharge Instructions: Apply moisturizing lotion as directed Prim Dressing: IODOFLEX 0.9% Cadexomer Iodine Pad 4x6 cm 1 x Per Week/30 Days ary Discharge Instructions: Apply to wound bed as instructed Secondary Dressing: Woven Gauze Sponge, Non-Sterile 4x4 in 1 x Per Week/30 Days Discharge Instructions: Apply over primary dressing as directed. [name], [name] ([id number]) [id number] Page 7 of 7 Com pression Wrap: Urgo K2 Lite, (equivalent to a 3 layer) two layer compression system, regular 1 x Per Week/30 Days Discharge Instructions: Apply Urgo K2 Lite as directed (alternative to 3 layer compression). Com pression Wrap: Stockinette 1 x Per Week/30 Days WOUND #2R: - Lower Leg Wound Laterality: Left, Posterior Peri-Wound Care: Sween Lotion (Moisturizing lotion) Discharge Instructions: Apply moisturizing lotion as directed Topical: Triamcinolone Discharge Instructions: Apply Triamcinolone as directed Prim Dressing: PolyMem Non-Adhesive Dressing, 4x4 in ary Discharge Instructions: Apply to wound bed as  instructed Secondary Dressing: Zetuvit Plus Silicone Border Dressing 5x5 (in/in) Discharge Instructions: Apply silicone border over primary dressing as directed. 1. I change the dressing on the right leg postdebridement to Iodoflex 2. Still under 3 layer compression on the right 3. Possibly an abrasion injury in the wound area on the left that is reopened. We are using polymen silver here with his own stocking. Will need to monitor the periwound here this may be stasis dermatitis I do not believe this is cellulitis Electronic Signature(s) Signed: 04/24/2023 5:49:45 PM By: Shawn Stall RN, BSN Signed: 04/25/2023 4:27:46 PM By: [name] MD Previous Signature: 04/24/2023 9:33:55 AM Version By: [name] MD Entered By: Shawn Stall on 04/24/2023 17:43:58 -------------------------------------------------------------------------------- SuperBill Details Patient Name: Date of Service: [name] [name] Bulla Price. 04/23/2023 Medical Record Number: [id number] Patient Account Number: [id number] Date of Birth/Sex: Treating RN: [birth year] (52 y.o. [name] Primary Care Provider: Barbette Reichmann Other Clinician: Referring Provider: Treating Provider/Extender: Milas Kocher Weeks in Treatment: 12 Diagnosis Coding ICD-10 Codes Code Description [phone] Non-pressure chronic ulcer of other part of right lower leg with fat layer exposed L97.822 Non-pressure chronic ulcer of other part of left lower leg with fat layer exposed E11.622 Type 2 diabetes mellitus with other skin ulcer I87.313 Chronic venous hypertension (idiopathic) with ulcer of bilateral lower extremity Facility Procedures : CPT4 Code: [id number] Description: 11042 - DEB SUBQ TISSUE 20 SQ CM/< ICD-10 Diagnosis Description L97.812 Non-pressure chronic ulcer of other part of right lower leg with fat layer exp Modifier: osed Quantity: 1 Physician Procedures : CPT4 Code Description Modifier [id number] 11042 -  WC PHYS SUBQ TISS 20 SQ CM ICD-10 Diagnosis Description L97.812 Non-pressure chronic ulcer of other part of right lower leg with fat layer exposed Quantity: 1 Electronic Signature(s) Signed: 04/24/2023 9:33:55 AM By: [name] MD Entered By: [name] on 04/23/2023 10:39:28  maceration but fortunately no evidence of infection. He reminds me that he had an extensive surgery on this area apparently several years ago by Dr. Lajoyce Corners for a "staph infection". He has compression stockings from elastic therapy in [place] he got last May he says they are relatively new he is hardly worn them. 8/15; patient presents for follow-up. We have been using collagen to the left lower extremity and PolyMem silver to the right lower extremity all under 3 layer compression. Wounds are slightly smaller today. Patient has been approved for Apligraf and he was agreeable to having this placed in office. 8/20; patient presents for follow-up. At last clinic visit we used Apligraf to the right lower extremity under 3 layer compression and collagen to the left lower extremity under 3 layer compression. Wounds are smaller. 8/27; Patient presents for follow-up. We have been using Apligraf to the right lower extremity wound and collagen to the left lower extremity wound under 3 layer compression. Overall wounds appear well-healing. He has no issues or complaints. 9/30; patient presents for follow-up. We have been using Apligraf to the right lower extremity wound and switch to PolyMem to the left lower extremity wound. All under 3 layer compression. Wounds are well-healing. 9/10; patient presents for follow-up. We have been using Apligraf to the right lower extremity wound and PolyMem silver to the left lower extremity wound all under 3 layer compression. Wounds are smaller. 10/8; last week we changed the primary dressing on the large right lower leg wound in the middle of scar tissue to Hydrofera Blue. He comes in today with a surface not looking  particularly healthy. ALSO he has had a reopening in the area of the original left calf wound apparently happened while he was washing off the wound area. He is put polymen silver on this and his own compression stocking since Electronic Signature(s) Signed: 04/24/2023 9:33:55 AM By: [name] MD Entered By: [name] on 04/23/2023 10:36:13 -------------------------------------------------------------------------------- Physical Exam Details Patient Name: Date of Service: [name] [name] Bulla Price. 04/23/2023 9:30 A M Medical Record Number: [id number] Patient Account Number: [id number] Date of Birth/Sex: Treating RN: 05/25/1971 (52 y.o. M) Primary Care Provider: Barbette Reichmann Other Clinician: Referring Provider: Treating Provider/Extender: Milas Kocher Weeks in Treatment: 12 Constitutional Sitting or standing Blood Pressure is within target range for patient.. Pulse regular and within target range for patient.Marland Kitchen Respirations regular, non-labored and within target range.. Temperature is normal and within the target range for the patient.Marland Kitchen Appears in no distress. Notes Wound exam; right lower extremity wound completely nonviable surface. Extensively debrided with a #3 curette removing adherent surface slough and subcutaneous tissue. I am going to continue the debridement using Iodoflex as the primary dressing On the left lower leg and the site of the original wound the small area of this is now open most of it still epithelialized it looks as though he has inflammation around this but no tenderness Electronic Signature(s) Signed: 04/24/2023 9:33:55 AM By: [name] MD Entered By: [name] on 04/23/2023 10:37:46 [name] ([id number]) [id number] Page 3 of 7 -------------------------------------------------------------------------------- Physician Orders Details Patient Name: Date of Service: [name] [name]. 04/23/2023 9:30 A M Medical Record Number: [id number] Patient Account Number: [id number] Date of Birth/Sex: Treating RN: 12-Sep-1970 (52 y.o. [name] Primary Care Provider: Barbette Reichmann Other Clinician: Referring Provider: Treating Provider/Extender: Milas Kocher Weeks in Treatment: 12 The following information was scribed by: Brenton Grills The information was scribed for: Leanord Hawking,  maceration but fortunately no evidence of infection. He reminds me that he had an extensive surgery on this area apparently several years ago by Dr. Lajoyce Corners for a "staph infection". He has compression stockings from elastic therapy in [place] he got last May he says they are relatively new he is hardly worn them. 8/15; patient presents for follow-up. We have been using collagen to the left lower extremity and PolyMem silver to the right lower extremity all under 3 layer compression. Wounds are slightly smaller today. Patient has been approved for Apligraf and he was agreeable to having this placed in office. 8/20; patient presents for follow-up. At last clinic visit we used Apligraf to the right lower extremity under 3 layer compression and collagen to the left lower extremity under 3 layer compression. Wounds are smaller. 8/27; Patient presents for follow-up. We have been using Apligraf to the right lower extremity wound and collagen to the left lower extremity wound under 3 layer compression. Overall wounds appear well-healing. He has no issues or complaints. 9/30; patient presents for follow-up. We have been using Apligraf to the right lower extremity wound and switch to PolyMem to the left lower extremity wound. All under 3 layer compression. Wounds are well-healing. 9/10; patient presents for follow-up. We have been using Apligraf to the right lower extremity wound and PolyMem silver to the left lower extremity wound all under 3 layer compression. Wounds are smaller. 10/8; last week we changed the primary dressing on the large right lower leg wound in the middle of scar tissue to Hydrofera Blue. He comes in today with a surface not looking  particularly healthy. ALSO he has had a reopening in the area of the original left calf wound apparently happened while he was washing off the wound area. He is put polymen silver on this and his own compression stocking since Electronic Signature(s) Signed: 04/24/2023 9:33:55 AM By: [name] MD Entered By: [name] on 04/23/2023 10:36:13 -------------------------------------------------------------------------------- Physical Exam Details Patient Name: Date of Service: [name] [name] Bulla Price. 04/23/2023 9:30 A M Medical Record Number: [id number] Patient Account Number: [id number] Date of Birth/Sex: Treating RN: 05/25/1971 (52 y.o. M) Primary Care Provider: Barbette Reichmann Other Clinician: Referring Provider: Treating Provider/Extender: Milas Kocher Weeks in Treatment: 12 Constitutional Sitting or standing Blood Pressure is within target range for patient.. Pulse regular and within target range for patient.Marland Kitchen Respirations regular, non-labored and within target range.. Temperature is normal and within the target range for the patient.Marland Kitchen Appears in no distress. Notes Wound exam; right lower extremity wound completely nonviable surface. Extensively debrided with a #3 curette removing adherent surface slough and subcutaneous tissue. I am going to continue the debridement using Iodoflex as the primary dressing On the left lower leg and the site of the original wound the small area of this is now open most of it still epithelialized it looks as though he has inflammation around this but no tenderness Electronic Signature(s) Signed: 04/24/2023 9:33:55 AM By: [name] MD Entered By: [name] on 04/23/2023 10:37:46 [name] ([id number]) [id number] Page 3 of 7 -------------------------------------------------------------------------------- Physician Orders Details Patient Name: Date of Service: [name] [name]. 04/23/2023 9:30 A M Medical Record Number: [id number] Patient Account Number: [id number] Date of Birth/Sex: Treating RN: 12-Sep-1970 (52 y.o. [name] Primary Care Provider: Barbette Reichmann Other Clinician: Referring Provider: Treating Provider/Extender: Milas Kocher Weeks in Treatment: 12 The following information was scribed by: Brenton Grills The information was scribed for: Leanord Hawking,

## 2023-04-25 NOTE — Progress Notes (Signed)
moisturizing lotion as directed Topical Triamcinolone Discharge Instruction: Apply Triamcinolone as directed Primary Dressing PolyMem Non-Adhesive Dressing, 4x4 in Discharge Instruction: Apply to wound bed as instructed Secondary Dressing Zetuvit Plus Silicone Border Dressing 5x5 (in/in) Discharge Instruction: Apply silicone border over primary dressing as directed. Secured With Compression Wrap Compression Stockings Add-Ons Electronic Signature(s) Signed: 04/25/2023 4:20:59 PM By: Brenton Grills Entered By: Brenton Grills on 04/23/2023 09:52:20 [name] ([id number]) [id number] Page 10 of 10 -------------------------------------------------------------------------------- Vitals Details Patient Name: Date of Service: [name] [name] 04/23/2023 9:30 A M Medical Record Number: [id number] Patient Account Number: [id number] Date of Birth/Sex:  Treating RN: 28-Jul-1970 (52 y.o. [name] Primary Care Areatha Kalata: Barbette Reichmann Other Clinician: Referring Leia Coletti: Treating Renae Mottley/Extender: Milas Kocher Weeks in Treatment: 12 Vital Signs Time Taken: 09:35 Temperature (F): 98 Height (in): 70 Pulse (bpm): 59 Weight (lbs): 264 Respiratory Rate (breaths/min): 18 Body Mass Index (BMI): 37.9 Blood Pressure (mmHg): 121/77 Reference Range: 80 - 120 mg / dl Electronic Signature(s) Signed: 04/25/2023 4:20:59 PM By: Brenton Grills Entered By: Brenton Grills on 04/23/2023 09:35:24  moisturizing lotion as directed Topical Triamcinolone Discharge Instruction: Apply Triamcinolone as directed Primary Dressing PolyMem Non-Adhesive Dressing, 4x4 in Discharge Instruction: Apply to wound bed as instructed Secondary Dressing Zetuvit Plus Silicone Border Dressing 5x5 (in/in) Discharge Instruction: Apply silicone border over primary dressing as directed. Secured With Compression Wrap Compression Stockings Add-Ons Electronic Signature(s) Signed: 04/25/2023 4:20:59 PM By: Brenton Grills Entered By: Brenton Grills on 04/23/2023 09:52:20 [name] ([id number]) [id number] Page 10 of 10 -------------------------------------------------------------------------------- Vitals Details Patient Name: Date of Service: [name] [name] 04/23/2023 9:30 A M Medical Record Number: [id number] Patient Account Number: [id number] Date of Birth/Sex:  Treating RN: 28-Jul-1970 (52 y.o. [name] Primary Care Areatha Kalata: Barbette Reichmann Other Clinician: Referring Leia Coletti: Treating Renae Mottley/Extender: Milas Kocher Weeks in Treatment: 12 Vital Signs Time Taken: 09:35 Temperature (F): 98 Height (in): 70 Pulse (bpm): 59 Weight (lbs): 264 Respiratory Rate (breaths/min): 18 Body Mass Index (BMI): 37.9 Blood Pressure (mmHg): 121/77 Reference Range: 80 - 120 mg / dl Electronic Signature(s) Signed: 04/25/2023 4:20:59 PM By: Brenton Grills Entered By: Brenton Grills on 04/23/2023 09:35:24  Discharge Instruction: Apply over primary dressing as directed. Secured With Compression Wrap Urgo K2 Lite, (equivalent to a 3 layer) two layer compression system, regular Discharge Instruction: Apply Urgo K2 Lite as directed (alternative to 3 layer compression). Stockinette Compression Stockings Add-Ons Wound #2R (Lower Leg) Wound Laterality: Left, Posterior Cleanser Peri-Wound Care Sween Lotion (Moisturizing lotion) Discharge Instruction: Apply moisturizing lotion as directed Topical Triamcinolone Discharge Instruction: Apply Triamcinolone as directed Primary Dressing PolyMem Non-Adhesive Dressing, 4x4 in Discharge  Instruction: Apply to wound bed as instructed [name], [name] ([id number]) [id number].pdf Page 5 of 10 Secondary Dressing Zetuvit Plus Silicone Border Dressing 5x5 (in/in) Discharge Instruction: Apply silicone border over primary dressing as directed. Secured With Compression Wrap Compression Stockings Facilities manager) Signed: 04/24/2023 9:33:55 AM By: [name] MD Entered By: [name] on 04/23/2023 10:34:48 -------------------------------------------------------------------------------- Multi-Disciplinary Care Plan Details Patient Name: Date of Service: [name] [name] Bulla K. 04/23/2023 9:30 A M Medical Record Number: [id number] Patient Account Number: [id number] Date of Birth/Sex: Treating RN: [birth year] (52 y.o. [name] Primary Care Jesenia Spera: Barbette Reichmann Other Clinician: Referring Koleen Celia: Treating Yuepheng Schaller/Extender: Milas Kocher Weeks in Treatment: 12 Active Inactive Wound/Skin Impairment Nursing Diagnoses: Impaired tissue integrity Goals: Patient/caregiver will verbalize understanding of skin care regimen Date Initiated: 01/25/2023 Target Resolution Date: 05/16/2023 Goal Status: Active Interventions: Assess ulceration(s) every visit Treatment Activities: Skin care regimen initiated : 01/25/2023 Notes: Electronic Signature(s) Signed: 04/25/2023 4:20:59 PM By: Brenton Grills Entered By: Brenton Grills on 04/23/2023 09:48:28 -------------------------------------------------------------------------------- Pain Assessment Details Patient Name: Date of Service: [name] [name] Bulla K. 04/23/2023 9:30 A M Medical Record Number: [id number] Patient Account Number: [id number] Date of Birth/Sex: Treating RN: [birth year] (52 y.o. [name] Primary Care Janyla Biscoe: Barbette Reichmann Other Clinician: Referring Baudelio Karnes: Treating Carisma Troupe/Extender: Imagene Sheller  in Treatment: [address] [name] ([id number]) [id number] Page 6 of 10 Active Problems Location of Pain Severity and Description of Pain Patient Has Paino No Site Locations Pain Management and Medication Current Pain Management: Electronic Signature(s) Signed: 04/25/2023 4:20:59 PM By: Brenton Grills Entered By: Brenton Grills on 04/23/2023 09:35:33 -------------------------------------------------------------------------------- Patient/Caregiver Education Details Patient Name: Date of Service: [name] [name] 10/8/2024andnbsp9:30 A M Medical Record Number: [id number] Patient Account Number: [id number] Date of Birth/Gender: Treating RN: 1971/07/06 (52 y.o. [name] Primary Care Physician: Barbette Reichmann Other Clinician: Referring Physician: Treating Physician/Extender: Imagene Sheller in Treatment: 12 Education Assessment Education Provided To: Patient Education Topics Provided Wound/Skin Impairment: Methods: Explain/Verbal Responses: State content correctly Electronic Signature(s) Signed: 04/25/2023 4:20:59 PM By: Brenton Grills Entered By: Brenton Grills on 04/23/2023 09:55:04 [name] ([id number]) [id number] Page 7 of 10 -------------------------------------------------------------------------------- Wound Assessment Details Patient Name: Date of Service: [name] [name] 04/23/2023 9:30 A M Medical Record Number: [id number] Patient Account Number: [id number] Date of Birth/Sex: Treating RN: 1970-08-11 (52 y.o. [name] Primary Care Adalie Mand: Barbette Reichmann Other Clinician: Referring [name]: Treating [name]/Extender: Milas Kocher Weeks in Treatment: 12 Wound Status Wound Number: 1 Primary Diabetic Wound/Ulcer of the Lower Extremity Etiology: Wound Location: Right, Lateral Lower Leg Wound Open Wounding Event: Other  Lesion Status: Date Acquired: 01/01/2018 Notes: Pt. 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 Width: (cm) 1. Depth: (cm) 0. Area: (cm) 6 Volume: (cm) 1 6 % Reduction in Area: 50.2% 8 %  moisturizing lotion as directed Topical Triamcinolone Discharge Instruction: Apply Triamcinolone as directed Primary Dressing PolyMem Non-Adhesive Dressing, 4x4 in Discharge Instruction: Apply to wound bed as instructed Secondary Dressing Zetuvit Plus Silicone Border Dressing 5x5 (in/in) Discharge Instruction: Apply silicone border over primary dressing as directed. Secured With Compression Wrap Compression Stockings Add-Ons Electronic Signature(s) Signed: 04/25/2023 4:20:59 PM By: Brenton Grills Entered By: Brenton Grills on 04/23/2023 09:52:20 [name] ([id number]) [id number] Page 10 of 10 -------------------------------------------------------------------------------- Vitals Details Patient Name: Date of Service: [name] [name] 04/23/2023 9:30 A M Medical Record Number: [id number] Patient Account Number: [id number] Date of Birth/Sex:  Treating RN: 28-Jul-1970 (52 y.o. [name] Primary Care Areatha Kalata: Barbette Reichmann Other Clinician: Referring Leia Coletti: Treating Renae Mottley/Extender: Milas Kocher Weeks in Treatment: 12 Vital Signs Time Taken: 09:35 Temperature (F): 98 Height (in): 70 Pulse (bpm): 59 Weight (lbs): 264 Respiratory Rate (breaths/min): 18 Body Mass Index (BMI): 37.9 Blood Pressure (mmHg): 121/77 Reference Range: 80 - 120 mg / dl Electronic Signature(s) Signed: 04/25/2023 4:20:59 PM By: Brenton Grills Entered By: Brenton Grills on 04/23/2023 09:35:24  Discharge Instruction: Apply over primary dressing as directed. Secured With Compression Wrap Urgo K2 Lite, (equivalent to a 3 layer) two layer compression system, regular Discharge Instruction: Apply Urgo K2 Lite as directed (alternative to 3 layer compression). Stockinette Compression Stockings Add-Ons Wound #2R (Lower Leg) Wound Laterality: Left, Posterior Cleanser Peri-Wound Care Sween Lotion (Moisturizing lotion) Discharge Instruction: Apply moisturizing lotion as directed Topical Triamcinolone Discharge Instruction: Apply Triamcinolone as directed Primary Dressing PolyMem Non-Adhesive Dressing, 4x4 in Discharge  Instruction: Apply to wound bed as instructed [name], [name] ([id number]) [phone].pdf Page 5 of 10 Secondary Dressing Zetuvit Plus Silicone Border Dressing 5x5 (in/in) Discharge Instruction: Apply silicone border over primary dressing as directed. Secured With Compression Wrap Compression Stockings Facilities manager) Signed: 04/24/2023 9:33:55 AM By: [name] MD Entered By: [name] on 04/23/2023 10:34:48 -------------------------------------------------------------------------------- Multi-Disciplinary Care Plan Details Patient Name: Date of Service: [name] [name] Bulla K. 04/23/2023 9:30 A M Medical Record Number: [id number] Patient Account Number: [id number] Date of Birth/Sex: Treating RN: [birth year] (52 y.o. [name] Primary Care Jesenia Spera: Barbette Reichmann Other Clinician: Referring Koleen Celia: Treating Yuepheng Schaller/Extender: Milas Kocher Weeks in Treatment: 12 Active Inactive Wound/Skin Impairment Nursing Diagnoses: Impaired tissue integrity Goals: Patient/caregiver will verbalize understanding of skin care regimen Date Initiated: 01/25/2023 Target Resolution Date: 05/16/2023 Goal Status: Active Interventions: Assess ulceration(s) every visit Treatment Activities: Skin care regimen initiated : 01/25/2023 Notes: Electronic Signature(s) Signed: 04/25/2023 4:20:59 PM By: Brenton Grills Entered By: Brenton Grills on 04/23/2023 09:48:28 -------------------------------------------------------------------------------- Pain Assessment Details Patient Name: Date of Service: [name] [name] Bulla K. 04/23/2023 9:30 A M Medical Record Number: [id number] Patient Account Number: [id number] Date of Birth/Sex: Treating RN: [birth year] (52 y.o. [name] Primary Care Janyla Biscoe: Barbette Reichmann Other Clinician: Referring Baudelio Karnes: Treating Carisma Troupe/Extender: Imagene Sheller  in Treatment: [address] [name] ([id number]) [id number] Page 6 of 10 Active Problems Location of Pain Severity and Description of Pain Patient Has Paino No Site Locations Pain Management and Medication Current Pain Management: Electronic Signature(s) Signed: 04/25/2023 4:20:59 PM By: Brenton Grills Entered By: Brenton Grills on 04/23/2023 09:35:33 -------------------------------------------------------------------------------- Patient/Caregiver Education Details Patient Name: Date of Service: [name] [name] 10/8/2024andnbsp9:30 A M Medical Record Number: [id number] Patient Account Number: [id number] Date of Birth/Gender: Treating RN: 1971/07/06 (52 y.o. [name] Primary Care Physician: Barbette Reichmann Other Clinician: Referring Physician: Treating Physician/Extender: Imagene Sheller in Treatment: 12 Education Assessment Education Provided To: Patient Education Topics Provided Wound/Skin Impairment: Methods: Explain/Verbal Responses: State content correctly Electronic Signature(s) Signed: 04/25/2023 4:20:59 PM By: Brenton Grills Entered By: Brenton Grills on 04/23/2023 09:55:04 [name] ([id number]) [id number] Page 7 of 10 -------------------------------------------------------------------------------- Wound Assessment Details Patient Name: Date of Service: [name] [name] 04/23/2023 9:30 A M Medical Record Number: [id number] Patient Account Number: [id number] Date of Birth/Sex: Treating RN: 1970-08-11 (52 y.o. [name] Primary Care Adalie Mand: Barbette Reichmann Other Clinician: Referring [name]: Treating [name]/Extender: Milas Kocher Weeks in Treatment: 12 Wound Status Wound Number: 1 Primary Diabetic Wound/Ulcer of the Lower Extremity Etiology: Wound Location: Right, Lateral Lower Leg Wound Open Wounding Event: Other  Lesion Status: Date Acquired: 01/01/2018 Notes: Pt. 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 Width: (cm) 1. Depth: (cm) 0. Area: (cm) 6 Volume: (cm) 1 6 % Reduction in Area: 50.2% 8 %

## 2023-04-30 ENCOUNTER — Encounter (HOSPITAL_BASED_OUTPATIENT_CLINIC_OR_DEPARTMENT_OTHER): Payer: Medicaid Other | Admitting: Internal Medicine

## 2023-04-30 DIAGNOSIS — Z794 Long term (current) use of insulin: Secondary | ICD-10-CM | POA: Diagnosis not present

## 2023-04-30 DIAGNOSIS — I87313 Chronic venous hypertension (idiopathic) with ulcer of bilateral lower extremity: Secondary | ICD-10-CM | POA: Diagnosis not present

## 2023-04-30 DIAGNOSIS — L97812 Non-pressure chronic ulcer of other part of right lower leg with fat layer exposed: Secondary | ICD-10-CM | POA: Diagnosis not present

## 2023-04-30 DIAGNOSIS — I872 Venous insufficiency (chronic) (peripheral): Secondary | ICD-10-CM | POA: Diagnosis not present

## 2023-04-30 DIAGNOSIS — I10 Essential (primary) hypertension: Secondary | ICD-10-CM | POA: Diagnosis not present

## 2023-04-30 DIAGNOSIS — L97822 Non-pressure chronic ulcer of other part of left lower leg with fat layer exposed: Secondary | ICD-10-CM | POA: Diagnosis not present

## 2023-04-30 DIAGNOSIS — Z86718 Personal history of other venous thrombosis and embolism: Secondary | ICD-10-CM | POA: Diagnosis not present

## 2023-04-30 DIAGNOSIS — E11622 Type 2 diabetes mellitus with other skin ulcer: Secondary | ICD-10-CM | POA: Diagnosis not present

## 2023-05-01 NOTE — Progress Notes (Signed)
wound all under 3 layer compression. Wounds are smaller. 10/8; last week we changed the primary dressing on the large right lower leg wound in  the middle of scar tissue to Hydrofera Blue. He comes in today with a surface not looking particularly healthy. ALSO he has had a reopening in the area of the original left calf wound apparently happened while he was washing off the wound area. He is put polymen silver on this and his own compression stocking since 10/15; the area on the left posterior calf is healed. The large wound on the right in the middle lips scar tissue we changed to Iodoflex I believe last week. Surface looks a lot better Electronic Signature(s) Signed: 04/30/2023 4:40:46 PM By: [name] Najjar MD Entered By: [name] on 04/30/2023 15:16:58 -------------------------------------------------------------------------------- Physical Exam Details Patient Name: Date of Service: [name] 04/30/2023 2:30 PM Medical Record Number: [id number] Patient Account Number: [id number] TRESTEN, PANTOJA ([id number]) [phone].pdf Page 2 of 7 Date of Birth/Sex: Treating RN: 07-13-1971 (52 y.o. M) Primary Care Provider: Barbette Reichmann Other Clinician: Referring Provider: Treating Provider/Extender: Milas Kocher Weeks in Treatment: 13 Constitutional Patient is hypertensive.. Pulse regular and within target range for patient.Marland Kitchen Respirations regular, non-labored and within target range.. Temperature is normal and within the target range for the patient.Marland Kitchen Appears in no distress. Cardiovascular Pedal pulses are palpable. Edema control is good. Notes Wound exam; right lower extremity wound had a much better surface this week after debridement and switch changing to Iodoflex. We will continue that this week the area on the left posterior calf has closed Electronic Signature(s) Signed: 04/30/2023 4:40:46 PM By: [name] Najjar MD Entered By: [name] on 04/30/2023 15:18:10 -------------------------------------------------------------------------------- Physician  Orders Details Patient Name: Date of Service: [name] 04/30/2023 2:30 PM Medical Record Number: [id number] Patient Account Number: [id number] Date of Birth/Sex: Treating RN: [birth year] (52 y.o. [name] Primary Care Provider: Barbette Reichmann Other Clinician: Referring Provider: Treating Provider/Extender: Milas Kocher Weeks in Treatment: 13 The following information was scribed by: Brenton Grills The information was scribed for: [name] Verbal / Phone Orders: No Diagnosis Coding Follow-up Appointments ppointment in 1 week. - Dr. Leanord Hawking Room 9 - Please schedule appt. Return A ppointment in 2 weeks. - Dr Drue Stager 7or 9 Please ask front desk to schedule an appointment Return A Anesthetic (In clinic) Topical Lidocaine 4% applied to wound bed Cellular or Tissue Based Products Wound #1 Right,Lateral Lower Leg Cellular or Tissue Based Product Type: - Apligraf #1 applied 02/28/23 Apligraf #2 applied 03/05/2023 Apligraf #3 applied 03/12/2023 Apligraf #4 applied 03/19/2023 Apligraf #5 applied on 03/26/2023 Bathing/ Shower/ Hygiene May shower with protection but do not get wound dressing(s) wet. Protect dressing(s) with water repellant cover (for example, large plastic bag) or a cast cover and may then take shower. - Please do not get the legs wet. Please use cast protectors when showering. Cast protectors can be purchased from [employer], Medical supply store. Cost ranges from $17-$37 Edema Control - Lymphedema / SCD / Other Bilateral Lower Extremities Lymphedema Pumps. Use Lymphedema pumps on leg(s) 2-3 times a day for 45-60 minutes. If wearing any wraps or hose, do not remove them. Continue exercising as instructed. - Keep using the Lymphadema Pumps.You may place the th Lyphadema pumps over the compression wraps. Elevate legs to the level of the heart or above for 30 minutes daily and/or when sitting for 3-4 times a day  throughout the day. Avoid  dressing(s) with water repellant cover (for example, large plastic bag) or a cast cover and may then take shower. - Please do not get the legs wet. Please use cast protectors when showering. Cast protectors can be purchased from [employer], Medical supply store. Cost ranges from $17-$37 Edema Control - Lymphedema / SCD / Other: Lymphedema Pumps. Use Lymphedema pumps on leg(s) 2-3 times a day for 45-60 minutes. If wearing any wraps or hose, do not remove them. Continue exercising as instructed. - Keep using the Lymphadema Pumps.You may place the th Lyphadema pumps over the compression wraps. AHMAAD, NEIDHARDT ([id number]) [id number] Page 6 of 7 Elevate legs to the level of the heart or above for 30 minutes daily and/or when sitting for 3-4 times a day throughout the day. Avoid standing for long periods of time. Patient to wear own compression stockings every day. - Left leg during the day Exercise regularly - As tolerated Moisturize legs daily. Off-Loading: Other: - Keep legs elevated to heart level or above when sitting. You may place a pillow behind the calf for comfort, if so desired. WOUND #1: - Lower Leg Wound Laterality: Right, Lateral Cleanser: Soap and Water 1 x Per Week/30 Days Discharge Instructions: May shower and wash wound with dial antibacterial soap and water prior to dressing change. Cleanser: Vashe 5.8 (oz) 1 x Per Week/30 Days Discharge Instructions: Cleanse the wound with Vashe prior to applying a clean dressing using gauze sponges, not tissue or cotton balls. Peri-Wound Care: Triamcinolone 15 (g) 1 x Per Week/30 Days Discharge Instructions: Use triamcinolone 15  (g) as directed Peri-Wound Care: Sween Lotion (Moisturizing lotion) 1 x Per Week/30 Days Discharge Instructions: Apply moisturizing lotion as directed Prim Dressing: IODOFLEX 0.9% Cadexomer Iodine Pad 4x6 cm 1 x Per Week/30 Days ary Discharge Instructions: Apply to wound bed as instructed Secondary Dressing: Woven Gauze Sponge, Non-Sterile 4x4 in 1 x Per Week/30 Days Discharge Instructions: Apply over primary dressing as directed. Com pression Wrap: Urgo K2 Lite, (equivalent to a 3 layer) two layer compression system, regular 1 x Per Week/30 Days Discharge Instructions: Apply Urgo K2 Lite as directed (alternative to 3 layer compression). Com pression Wrap: Stockinette 1 x Per Week/30 Days WOUND #2R: - Lower Leg Wound Laterality: Left, Posterior Peri-Wound Care: Sween Lotion (Moisturizing lotion) Discharge Instructions: Apply moisturizing lotion as directed Topical: Triamcinolone Discharge Instructions: Apply Triamcinolone as directed Prim Dressing: PolyMem Non-Adhesive Dressing, 4x4 in ary Discharge Instructions: Apply to wound bed as instructed Secondary Dressing: Zetuvit Plus Silicone Border Dressing 5x5 (in/in) Discharge Instructions: Apply silicone border over primary dressing as directed. 1. Continue mL Iodoflex to the right leg. This did a nice job cleaning up the surface of the wound 2. Apparently he is already had a course of Apligraf. This is indeed unfortunate 3. Depending on how this wound looks may change to collagen or Hydrofera Blue Electronic Signature(s) Signed: 04/30/2023 4:40:46 PM By: [name] Najjar MD Entered By: [name] on 04/30/2023 15:21:02 -------------------------------------------------------------------------------- SuperBill Details Patient Name: Date of Service: [name] 04/30/2023 Medical Record Number: [id number] Patient Account Number: [id number] Date of Birth/Sex: Treating RN: 04/18/1971 (52 y.o. [name] Primary Care  Provider: Barbette Reichmann Other Clinician: Referring Provider: Treating Provider/Extender: Milas Kocher Weeks in Treatment: 13 Diagnosis Coding ICD-10 Codes Code Description [phone] Non-pressure chronic ulcer of other part of right lower leg with fat layer exposed L97.822 Non-pressure chronic ulcer of other part of left lower leg with fat layer exposed E11.622 Type 2 diabetes  standing for long periods of time. Patient to wear own compression stockings every day. - Left leg during the day Exercise regularly - As tolerated Moisturize legs daily. Off-Loading Other: - Keep legs elevated to heart level or above when sitting. You may place a pillow behind the calf for comfort, if so desired. Wound Treatment EYAL, GREENHAW ([id number]) [phone].pdf Page 3 of 7 Wound #1 - Lower Leg Wound Laterality: Right, Lateral Cleanser: Soap and Water 1 x Per Week/30 Days Discharge Instructions: May shower and wash wound with dial antibacterial soap and water prior to dressing change. Cleanser: Vashe 5.8 (oz) 1 x Per Week/30 Days Discharge Instructions: Cleanse the wound with Vashe prior to applying a clean dressing using gauze sponges, not tissue or cotton balls. Peri-Wound Care: Triamcinolone 15 (g) 1 x Per Week/30 Days Discharge Instructions: Use triamcinolone 15 (g) as directed Peri-Wound Care: Sween Lotion (Moisturizing lotion) 1 x Per Week/30 Days Discharge Instructions: Apply moisturizing lotion as directed Prim Dressing: IODOFLEX 0.9% Cadexomer Iodine Pad 4x6 cm 1 x Per Week/30 Days ary Discharge Instructions: Apply to wound bed as instructed Secondary Dressing: Woven Gauze Sponge, Non-Sterile 4x4 in 1 x Per Week/30 Days Discharge Instructions: Apply over primary dressing as directed. Compression Wrap: Urgo K2 Lite, (equivalent to a 3 layer) two layer compression system, regular 1 x Per Week/30 Days Discharge Instructions: Apply Urgo K2 Lite as directed (alternative to 3 layer compression). Compression Wrap: Stockinette 1 x Per Week/30 Days Wound #2R - Lower Leg Wound Laterality: Left, Posterior Peri-Wound Care: Sween Lotion (Moisturizing lotion) Discharge Instructions: Apply moisturizing lotion as directed Topical: Triamcinolone Discharge Instructions: Apply Triamcinolone as directed Prim Dressing: PolyMem  Non-Adhesive Dressing, 4x4 in ary Discharge Instructions: Apply to wound bed as instructed Secondary Dressing: Zetuvit Plus Silicone Border Dressing 5x5 (in/in) Discharge Instructions: Apply silicone border over primary dressing as directed. Electronic Signature(s) Signed: 04/30/2023 4:40:46 PM By: [name] Najjar MD Signed: 05/01/2023 4:07:14 PM By: Brenton Grills Entered By: Brenton Grills on 04/30/2023 14:55:50 -------------------------------------------------------------------------------- Problem List Details Patient Name: Date of Service: [name] 04/30/2023 2:30 PM Medical Record Number: [id number] Patient Account Number: [id number] Date of Birth/Sex: Treating RN: 04-May-1971 (52 y.o. [name] Primary Care Provider: Barbette Reichmann Other Clinician: Referring Provider: Treating Provider/Extender: Milas Kocher Weeks in Treatment: 13 Active Problems ICD-10 Encounter Code Description Active Date MDM Diagnosis L97.812 Non-pressure chronic ulcer of other part of right lower leg with fat layer 01/25/2023 No Yes exposed L97.822 Non-pressure chronic ulcer of other part of left lower leg with fat layer exposed7/06/2023 No Yes GOR, VESTAL ([id number]) [phone].pdf Page 4 of 7 E11.622 Type 2 diabetes mellitus with other skin ulcer 01/25/2023 No Yes I87.313 Chronic venous hypertension (idiopathic) with ulcer of bilateral lower extremity 01/25/2023 No Yes Inactive Problems Resolved Problems Electronic Signature(s) Signed: 04/30/2023 4:40:46 PM By: [name] Najjar MD Entered By: [name] on 04/30/2023 15:14:49 -------------------------------------------------------------------------------- Progress Note Details Patient Name: Date of Service: [name] 04/30/2023 2:30 PM Medical Record Number: [id number] Patient Account Number: [id number] Date of Birth/Sex: Treating RN: 07/05/1971 (52 y.o.  M) Primary Care Provider: Barbette Reichmann Other Clinician: Referring Provider: Treating Provider/Extender: Milas Kocher Weeks in Treatment: 13 Subjective History of Present Illness (HPI) 01/25/2023 Mr. Selim Durden is a 52 year old male with a past medical history of controlled type 2 diabetes on insulin, venous insufficiency and DVT that presents to the clinic for bilateral lower extremity wounds. He states that the wound on the left was  standing for long periods of time. Patient to wear own compression stockings every day. - Left leg during the day Exercise regularly - As tolerated Moisturize legs daily. Off-Loading Other: - Keep legs elevated to heart level or above when sitting. You may place a pillow behind the calf for comfort, if so desired. Wound Treatment EYAL, GREENHAW ([id number]) [phone].pdf Page 3 of 7 Wound #1 - Lower Leg Wound Laterality: Right, Lateral Cleanser: Soap and Water 1 x Per Week/30 Days Discharge Instructions: May shower and wash wound with dial antibacterial soap and water prior to dressing change. Cleanser: Vashe 5.8 (oz) 1 x Per Week/30 Days Discharge Instructions: Cleanse the wound with Vashe prior to applying a clean dressing using gauze sponges, not tissue or cotton balls. Peri-Wound Care: Triamcinolone 15 (g) 1 x Per Week/30 Days Discharge Instructions: Use triamcinolone 15 (g) as directed Peri-Wound Care: Sween Lotion (Moisturizing lotion) 1 x Per Week/30 Days Discharge Instructions: Apply moisturizing lotion as directed Prim Dressing: IODOFLEX 0.9% Cadexomer Iodine Pad 4x6 cm 1 x Per Week/30 Days ary Discharge Instructions: Apply to wound bed as instructed Secondary Dressing: Woven Gauze Sponge, Non-Sterile 4x4 in 1 x Per Week/30 Days Discharge Instructions: Apply over primary dressing as directed. Compression Wrap: Urgo K2 Lite, (equivalent to a 3 layer) two layer compression system, regular 1 x Per Week/30 Days Discharge Instructions: Apply Urgo K2 Lite as directed (alternative to 3 layer compression). Compression Wrap: Stockinette 1 x Per Week/30 Days Wound #2R - Lower Leg Wound Laterality: Left, Posterior Peri-Wound Care: Sween Lotion (Moisturizing lotion) Discharge Instructions: Apply moisturizing lotion as directed Topical: Triamcinolone Discharge Instructions: Apply Triamcinolone as directed Prim Dressing: PolyMem  Non-Adhesive Dressing, 4x4 in ary Discharge Instructions: Apply to wound bed as instructed Secondary Dressing: Zetuvit Plus Silicone Border Dressing 5x5 (in/in) Discharge Instructions: Apply silicone border over primary dressing as directed. Electronic Signature(s) Signed: 04/30/2023 4:40:46 PM By: [name] Najjar MD Signed: 05/01/2023 4:07:14 PM By: Brenton Grills Entered By: Brenton Grills on 04/30/2023 14:55:50 -------------------------------------------------------------------------------- Problem List Details Patient Name: Date of Service: [name] 04/30/2023 2:30 PM Medical Record Number: [id number] Patient Account Number: [id number] Date of Birth/Sex: Treating RN: 04-May-1971 (52 y.o. [name] Primary Care Provider: Barbette Reichmann Other Clinician: Referring Provider: Treating Provider/Extender: Milas Kocher Weeks in Treatment: 13 Active Problems ICD-10 Encounter Code Description Active Date MDM Diagnosis L97.812 Non-pressure chronic ulcer of other part of right lower leg with fat layer 01/25/2023 No Yes exposed L97.822 Non-pressure chronic ulcer of other part of left lower leg with fat layer exposed7/06/2023 No Yes GOR, VESTAL ([id number]) [phone].pdf Page 4 of 7 E11.622 Type 2 diabetes mellitus with other skin ulcer 01/25/2023 No Yes I87.313 Chronic venous hypertension (idiopathic) with ulcer of bilateral lower extremity 01/25/2023 No Yes Inactive Problems Resolved Problems Electronic Signature(s) Signed: 04/30/2023 4:40:46 PM By: [name] Najjar MD Entered By: [name] on 04/30/2023 15:14:49 -------------------------------------------------------------------------------- Progress Note Details Patient Name: Date of Service: [name] 04/30/2023 2:30 PM Medical Record Number: [id number] Patient Account Number: [id number] Date of Birth/Sex: Treating RN: 07/05/1971 (52 y.o.  M) Primary Care Provider: Barbette Reichmann Other Clinician: Referring Provider: Treating Provider/Extender: Milas Kocher Weeks in Treatment: 13 Subjective History of Present Illness (HPI) 01/25/2023 Mr. Selim Durden is a 52 year old male with a past medical history of controlled type 2 diabetes on insulin, venous insufficiency and DVT that presents to the clinic for bilateral lower extremity wounds. He states that the wound on the left was  dressing(s) with water repellant cover (for example, large plastic bag) or a cast cover and may then take shower. - Please do not get the legs wet. Please use cast protectors when showering. Cast protectors can be purchased from [employer], Medical supply store. Cost ranges from $17-$37 Edema Control - Lymphedema / SCD / Other: Lymphedema Pumps. Use Lymphedema pumps on leg(s) 2-3 times a day for 45-60 minutes. If wearing any wraps or hose, do not remove them. Continue exercising as instructed. - Keep using the Lymphadema Pumps.You may place the th Lyphadema pumps over the compression wraps. AHMAAD, NEIDHARDT ([id number]) [id number] Page 6 of 7 Elevate legs to the level of the heart or above for 30 minutes daily and/or when sitting for 3-4 times a day throughout the day. Avoid standing for long periods of time. Patient to wear own compression stockings every day. - Left leg during the day Exercise regularly - As tolerated Moisturize legs daily. Off-Loading: Other: - Keep legs elevated to heart level or above when sitting. You may place a pillow behind the calf for comfort, if so desired. WOUND #1: - Lower Leg Wound Laterality: Right, Lateral Cleanser: Soap and Water 1 x Per Week/30 Days Discharge Instructions: May shower and wash wound with dial antibacterial soap and water prior to dressing change. Cleanser: Vashe 5.8 (oz) 1 x Per Week/30 Days Discharge Instructions: Cleanse the wound with Vashe prior to applying a clean dressing using gauze sponges, not tissue or cotton balls. Peri-Wound Care: Triamcinolone 15 (g) 1 x Per Week/30 Days Discharge Instructions: Use triamcinolone 15  (g) as directed Peri-Wound Care: Sween Lotion (Moisturizing lotion) 1 x Per Week/30 Days Discharge Instructions: Apply moisturizing lotion as directed Prim Dressing: IODOFLEX 0.9% Cadexomer Iodine Pad 4x6 cm 1 x Per Week/30 Days ary Discharge Instructions: Apply to wound bed as instructed Secondary Dressing: Woven Gauze Sponge, Non-Sterile 4x4 in 1 x Per Week/30 Days Discharge Instructions: Apply over primary dressing as directed. Com pression Wrap: Urgo K2 Lite, (equivalent to a 3 layer) two layer compression system, regular 1 x Per Week/30 Days Discharge Instructions: Apply Urgo K2 Lite as directed (alternative to 3 layer compression). Com pression Wrap: Stockinette 1 x Per Week/30 Days WOUND #2R: - Lower Leg Wound Laterality: Left, Posterior Peri-Wound Care: Sween Lotion (Moisturizing lotion) Discharge Instructions: Apply moisturizing lotion as directed Topical: Triamcinolone Discharge Instructions: Apply Triamcinolone as directed Prim Dressing: PolyMem Non-Adhesive Dressing, 4x4 in ary Discharge Instructions: Apply to wound bed as instructed Secondary Dressing: Zetuvit Plus Silicone Border Dressing 5x5 (in/in) Discharge Instructions: Apply silicone border over primary dressing as directed. 1. Continue mL Iodoflex to the right leg. This did a nice job cleaning up the surface of the wound 2. Apparently he is already had a course of Apligraf. This is indeed unfortunate 3. Depending on how this wound looks may change to collagen or Hydrofera Blue Electronic Signature(s) Signed: 04/30/2023 4:40:46 PM By: [name] Najjar MD Entered By: [name] on 04/30/2023 15:21:02 -------------------------------------------------------------------------------- SuperBill Details Patient Name: Date of Service: [name] 04/30/2023 Medical Record Number: [id number] Patient Account Number: [id number] Date of Birth/Sex: Treating RN: 04/18/1971 (52 y.o. [name] Primary Care  Provider: Barbette Reichmann Other Clinician: Referring Provider: Treating Provider/Extender: Milas Kocher Weeks in Treatment: 13 Diagnosis Coding ICD-10 Codes Code Description [phone] Non-pressure chronic ulcer of other part of right lower leg with fat layer exposed L97.822 Non-pressure chronic ulcer of other part of left lower leg with fat layer exposed E11.622 Type 2 diabetes  wound all under 3 layer compression. Wounds are smaller. 10/8; last week we changed the primary dressing on the large right lower leg wound in  the middle of scar tissue to Hydrofera Blue. He comes in today with a surface not looking particularly healthy. ALSO he has had a reopening in the area of the original left calf wound apparently happened while he was washing off the wound area. He is put polymen silver on this and his own compression stocking since 10/15; the area on the left posterior calf is healed. The large wound on the right in the middle lips scar tissue we changed to Iodoflex I believe last week. Surface looks a lot better Electronic Signature(s) Signed: 04/30/2023 4:40:46 PM By: [name] Najjar MD Entered By: [name] on 04/30/2023 15:16:58 -------------------------------------------------------------------------------- Physical Exam Details Patient Name: Date of Service: [name] 04/30/2023 2:30 PM Medical Record Number: [id number] Patient Account Number: [id number] TRESTEN, PANTOJA ([id number]) [phone].pdf Page 2 of 7 Date of Birth/Sex: Treating RN: 07-13-1971 (52 y.o. M) Primary Care Provider: Barbette Reichmann Other Clinician: Referring Provider: Treating Provider/Extender: Milas Kocher Weeks in Treatment: 13 Constitutional Patient is hypertensive.. Pulse regular and within target range for patient.Marland Kitchen Respirations regular, non-labored and within target range.. Temperature is normal and within the target range for the patient.Marland Kitchen Appears in no distress. Cardiovascular Pedal pulses are palpable. Edema control is good. Notes Wound exam; right lower extremity wound had a much better surface this week after debridement and switch changing to Iodoflex. We will continue that this week the area on the left posterior calf has closed Electronic Signature(s) Signed: 04/30/2023 4:40:46 PM By: [name] Najjar MD Entered By: [name] on 04/30/2023 15:18:10 -------------------------------------------------------------------------------- Physician  Orders Details Patient Name: Date of Service: [name] 04/30/2023 2:30 PM Medical Record Number: [id number] Patient Account Number: [id number] Date of Birth/Sex: Treating RN: [birth year] (52 y.o. [name] Primary Care Provider: Barbette Reichmann Other Clinician: Referring Provider: Treating Provider/Extender: Milas Kocher Weeks in Treatment: 13 The following information was scribed by: Brenton Grills The information was scribed for: [name] Verbal / Phone Orders: No Diagnosis Coding Follow-up Appointments ppointment in 1 week. - Dr. Leanord Hawking Room 9 - Please schedule appt. Return A ppointment in 2 weeks. - Dr Drue Stager 7or 9 Please ask front desk to schedule an appointment Return A Anesthetic (In clinic) Topical Lidocaine 4% applied to wound bed Cellular or Tissue Based Products Wound #1 Right,Lateral Lower Leg Cellular or Tissue Based Product Type: - Apligraf #1 applied 02/28/23 Apligraf #2 applied 03/05/2023 Apligraf #3 applied 03/12/2023 Apligraf #4 applied 03/19/2023 Apligraf #5 applied on 03/26/2023 Bathing/ Shower/ Hygiene May shower with protection but do not get wound dressing(s) wet. Protect dressing(s) with water repellant cover (for example, large plastic bag) or a cast cover and may then take shower. - Please do not get the legs wet. Please use cast protectors when showering. Cast protectors can be purchased from [employer], Medical supply store. Cost ranges from $17-$37 Edema Control - Lymphedema / SCD / Other Bilateral Lower Extremities Lymphedema Pumps. Use Lymphedema pumps on leg(s) 2-3 times a day for 45-60 minutes. If wearing any wraps or hose, do not remove them. Continue exercising as instructed. - Keep using the Lymphadema Pumps.You may place the th Lyphadema pumps over the compression wraps. Elevate legs to the level of the heart or above for 30 minutes daily and/or when sitting for 3-4 times a day  throughout the day. Avoid  wound all under 3 layer compression. Wounds are smaller. 10/8; last week we changed the primary dressing on the large right lower leg wound in  the middle of scar tissue to Hydrofera Blue. He comes in today with a surface not looking particularly healthy. ALSO he has had a reopening in the area of the original left calf wound apparently happened while he was washing off the wound area. He is put polymen silver on this and his own compression stocking since 10/15; the area on the left posterior calf is healed. The large wound on the right in the middle lips scar tissue we changed to Iodoflex I believe last week. Surface looks a lot better Electronic Signature(s) Signed: 04/30/2023 4:40:46 PM By: [name] Najjar MD Entered By: [name] on 04/30/2023 15:16:58 -------------------------------------------------------------------------------- Physical Exam Details Patient Name: Date of Service: [name] 04/30/2023 2:30 PM Medical Record Number: [id number] Patient Account Number: [id number] TRESTEN, PANTOJA ([id number]) [phone].pdf Page 2 of 7 Date of Birth/Sex: Treating RN: 07-13-1971 (52 y.o. M) Primary Care Provider: Barbette Reichmann Other Clinician: Referring Provider: Treating Provider/Extender: Milas Kocher Weeks in Treatment: 13 Constitutional Patient is hypertensive.. Pulse regular and within target range for patient.Marland Kitchen Respirations regular, non-labored and within target range.. Temperature is normal and within the target range for the patient.Marland Kitchen Appears in no distress. Cardiovascular Pedal pulses are palpable. Edema control is good. Notes Wound exam; right lower extremity wound had a much better surface this week after debridement and switch changing to Iodoflex. We will continue that this week the area on the left posterior calf has closed Electronic Signature(s) Signed: 04/30/2023 4:40:46 PM By: [name] Najjar MD Entered By: [name] on 04/30/2023 15:18:10 -------------------------------------------------------------------------------- Physician  Orders Details Patient Name: Date of Service: [name] 04/30/2023 2:30 PM Medical Record Number: [id number] Patient Account Number: [id number] Date of Birth/Sex: Treating RN: [birth year] (52 y.o. [name] Primary Care Provider: Barbette Reichmann Other Clinician: Referring Provider: Treating Provider/Extender: Milas Kocher Weeks in Treatment: 13 The following information was scribed by: Brenton Grills The information was scribed for: [name] Verbal / Phone Orders: No Diagnosis Coding Follow-up Appointments ppointment in 1 week. - Dr. Leanord Hawking Room 9 - Please schedule appt. Return A ppointment in 2 weeks. - Dr Drue Stager 7or 9 Please ask front desk to schedule an appointment Return A Anesthetic (In clinic) Topical Lidocaine 4% applied to wound bed Cellular or Tissue Based Products Wound #1 Right,Lateral Lower Leg Cellular or Tissue Based Product Type: - Apligraf #1 applied 02/28/23 Apligraf #2 applied 03/05/2023 Apligraf #3 applied 03/12/2023 Apligraf #4 applied 03/19/2023 Apligraf #5 applied on 03/26/2023 Bathing/ Shower/ Hygiene May shower with protection but do not get wound dressing(s) wet. Protect dressing(s) with water repellant cover (for example, large plastic bag) or a cast cover and may then take shower. - Please do not get the legs wet. Please use cast protectors when showering. Cast protectors can be purchased from [employer], Medical supply store. Cost ranges from $17-$37 Edema Control - Lymphedema / SCD / Other Bilateral Lower Extremities Lymphedema Pumps. Use Lymphedema pumps on leg(s) 2-3 times a day for 45-60 minutes. If wearing any wraps or hose, do not remove them. Continue exercising as instructed. - Keep using the Lymphadema Pumps.You may place the th Lyphadema pumps over the compression wraps. Elevate legs to the level of the heart or above for 30 minutes daily and/or when sitting for 3-4 times a day  throughout the day. Avoid

## 2023-05-01 NOTE — Progress Notes (Signed)
[name] Bulla K. 04/30/2023 2:30 PM Medical Record Number: [id number] Patient Account Number: [id number] Date of Birth/Sex: Treating RN: December 25, 1970 (52 y.o. M) Primary Care [name]: Barbette Reichmann Other Clinician: Referring [name]: Treating [name]/Extender: [name] Weeks in Treatment: 13 Vital Signs Height(in): 70 Pulse(bpm): 66 Weight(lbs): 264 Blood Pressure(mmHg): 149/84 Body Mass Index(BMI): 37.9 Temperature(F): 98.5 Respiratory Rate(breaths/min): 18 [1:Photos:] [N/A:N/A] Right, Lateral Lower Leg Left, Posterior Lower Leg N/A Wound Location: Other Lesion Bite N/A Wounding Event: Diabetic Wound/Ulcer of the Lower Diabetic Wound/Ulcer of the Lower N/A Primary Etiology: Extremity Extremity Anemia, Deep Vein Thrombosis, Anemia, Deep Vein Thrombosis, N/A Comorbid History: Hypertension, Peripheral Venous Hypertension, Peripheral Venous Disease, Type II Diabetes, Gout Disease, Type II Diabetes, Gout 01/01/2018 09/17/2022 N/A Date Acquired: 13 13 N/A Weeks of  Treatment: Open Healed - Epithelialized N/A Wound Status: No Yes N/A Wound Recurrence: 4.6x1.5x0.3 0x0x0 N/A Measurements L x W x D (cm) 5.419 0 N/A A (cm) : rea 1.626 0 N/A Volume (cm) : 58.50% 100.00% N/A % Reduction in A rea: 68.90% 100.00% N/A % Reduction in Volume: Grade 2 Grade 1 N/A Classification: Medium None Present N/A Exudate A mount: Serosanguineous N/A N/A Exudate Type: red, brown N/A N/A Exudate Color: Thickened Distinct, outline attached N/A Wound Margin: Large (67-100%) None Present (0%) N/A Granulation A mount: Red, Pink N/A N/A Granulation Quality: Small (1-33%) Large (67-100%) N/A Necrotic A mount: Eschar Eschar N/A Necrotic Tissue: Fat Layer (Subcutaneous Tissue): Yes Fascia: No N/A Exposed Structures: Fascia: No Fat Layer (Subcutaneous Tissue): No Tendon: No Tendon: No Muscle: No Muscle: No Joint: No Joint: No Bone: No Bone: No Small (1-33%) Large (67-100%) N/A Epithelialization: Excoriation: No Excoriation: No N/A Periwound Skin Texture: Induration: No Induration: No Callus: No Callus: No Crepitus: No Crepitus: No Rash: No Rash: No Scarring: No Scarring: No Maceration: No Maceration: No N/A Periwound Skin Moisture: Dry/Scaly: No Dry/Scaly: No Hemosiderin Staining: Yes Hemosiderin Staining: Yes N/A Periwound Skin ColorTYREEK, [name] ([id number]) [id number].pdf Page 4 of 10 Atrophie Blanche: No Atrophie Blanche: No Cyanosis: No Cyanosis: No Ecchymosis: No Ecchymosis: No Erythema: No Erythema: No Mottled: No Mottled: No Pallor: No Pallor: No Rubor: No Rubor: No No Abnormality No Abnormality N/A Temperature: Compression Therapy N/A N/A Procedures Performed: Treatment Notes Wound #1 (Lower Leg) Wound Laterality: Right, Lateral Cleanser Soap and Water Discharge Instruction: May shower and wash wound with dial antibacterial soap and water prior to dressing change. Vashe 5.8  (oz) Discharge Instruction: Cleanse the wound with Vashe prior to applying a clean dressing using gauze sponges, not tissue or cotton balls. Peri-Wound Care Triamcinolone 15 (g) Discharge Instruction: Use triamcinolone 15 (g) as directed Sween Lotion (Moisturizing lotion) Discharge Instruction: Apply moisturizing lotion as directed Topical Primary Dressing IODOFLEX 0.9% Cadexomer Iodine Pad 4x6 cm Discharge Instruction: Apply to wound bed as instructed Secondary Dressing Woven Gauze Sponge, Non-Sterile 4x4 in Discharge Instruction: Apply over primary dressing as directed. Secured With Compression Wrap Urgo K2 Lite, (equivalent to a 3 layer) two layer compression system, regular Discharge Instruction: Apply Urgo K2 Lite as directed (alternative to 3 layer compression). Stockinette Compression Stockings Add-Ons Wound #2R (Lower Leg) Wound Laterality: Left, Posterior Cleanser Peri-Wound Care Topical Primary Dressing Secondary Dressing Secured With Compression Wrap Compression Stockings Add-Ons Electronic Signature(s) Signed: 04/30/2023 4:40:46 PM By: [name] Najjar MD Entered By: [name] on 04/30/2023 15:16:01 [name] ([id number]) [id number] Page 5 of 10 -------------------------------------------------------------------------------- Multi-Disciplinary Care Plan Details Patient Name: Date of Service: [name] [name] Bulla K. 04/30/2023 2:30 PM  Medical Record Number: [id number] Patient Account Number: [id number] Date of Birth/Sex: Treating RN: 06/09/1971 (52 y.o. [name] Primary Care [name]: Barbette Reichmann Other Clinician: Referring [name]: Treating [name]/Extender: [name] Weeks in Treatment: 13 Active Inactive Wound/Skin Impairment Nursing Diagnoses: Impaired tissue integrity Goals: Patient/caregiver will verbalize understanding of skin care regimen Date Initiated:  01/25/2023 Target Resolution Date: 05/16/2023 Goal Status: Active Interventions: Assess ulceration(s) every visit Treatment Activities: Skin care regimen initiated : 01/25/2023 Notes: Electronic Signature(s) Signed: 05/01/2023 4:07:14 PM By: Brenton Grills Entered By: Brenton Grills on 04/30/2023 14:43:54 -------------------------------------------------------------------------------- Pain Assessment Details Patient Name: Date of Service: [name] [name] Bulla K. 04/30/2023 2:30 PM Medical Record Number: [id number] Patient Account Number: [id number] Date of Birth/Sex: Treating RN: August 08, 1970 (52 y.o. [name] Primary Care [name]: Barbette Reichmann Other Clinician: Referring [name]: Treating [name]/Extender: [name] Weeks in Treatment: 13 Active Problems Location of Pain Severity and Description of Pain Patient Has Paino No Site Locations [name], [name] ([id number]) [id number] Page 6 of 10 Pain Management and Medication Current Pain Management: Electronic Signature(s) Signed: 05/01/2023 4:07:14 PM By: Brenton Grills Entered By: Brenton Grills on 04/30/2023 14:31:09 -------------------------------------------------------------------------------- Patient/Caregiver Education Details Patient Name: Date of Service: [name] 10/15/2024andnbsp2:30 PM Medical Record Number: [id number] Patient Account Number: [id number] Date of Birth/Gender: Treating RN: 1971-04-19 (52 y.o. [name] Primary Care Physician: Barbette Reichmann Other Clinician: Referring Physician: Treating Physician/Extender: Imagene Sheller in Treatment: 13 Education Assessment Education Provided To: Patient Education Topics Provided Wound/Skin Impairment: Methods: Explain/Verbal Responses: State content correctly Electronic Signature(s) Signed: 05/01/2023 4:07:14 PM By: Brenton Grills Entered By:  Brenton Grills on 04/30/2023 14:44:13 -------------------------------------------------------------------------------- Wound Assessment Details Patient Name: Date of Service: [name] [name] Bulla K. 04/30/2023 2:30 PM Medical Record Number: [id number] Patient Account Number: [id number] Date of Birth/Sex: Treating RN: 12/12/1970 (52 y.o. [name] Primary Care Marke Goodwyn: Barbette Reichmann Other Clinician: Referring Elizbeth Posa: Treating Ashira Kirsten/Extender: [facility], [place], [name] ([id number]) [id number] Page 7 of 10 Weeks in Treatment: 13 Wound Status Wound Number: 1 Primary Diabetic Wound/Ulcer of the Lower Extremity Etiology: Wound Location: Right, Lateral Lower Leg Wound Open Wounding Event: Other Lesion Status: Date Acquired: 01/01/2018 Notes: Pt. 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 Weeks Of Treatment: 13 Comorbid Anemia, Deep Vein Thrombosis, Hypertension, Peripheral Venous Clustered Wound: No History: Disease, Type II Diabetes, Gout Photos Wound Measurements Length: (cm) 4.6 Width: (cm) 1.5 Depth: (cm) 0.3 Area: (cm) 5.419 Volume: (cm) 1.626 % Reduction in Area: 58.5% % Reduction in Volume: 68.9% Epithelialization: Small (1-33%) Tunneling: No Undermining: No Wound Description Classification: Grade 2 Wound Margin: Thickened Exudate Amount: Medium Exudate Type: Serosanguineous Exudate Color: red, brown Foul Odor After Cleansing: No Slough/Fibrino Yes Wound Bed Granulation Amount: Large (67-100%) Exposed Structure Granulation Quality: Red, Pink Fascia Exposed: No Necrotic Amount: Small (1-33%) Fat Layer (Subcutaneous Tissue) Exposed: Yes Necrotic Quality: Eschar Tendon Exposed: No Muscle Exposed: No Joint Exposed: No Bone Exposed: No Periwound Skin Texture Texture Color No Abnormalities Noted: No No Abnormalities Noted: No Callus: No Atrophie Blanche: No Crepitus: No Cyanosis:  No Excoriation: No Ecchymosis: No Induration: No Erythema: No Rash: No Hemosiderin Staining: Yes Scarring: No Mottled: No Pallor: No Moisture Rubor: No No Abnormalities Noted: No Dry / Scaly: No Temperature / Pain Maceration: No Temperature: No Abnormality Treatment Notes Wound #1 (Lower Leg) Wound Laterality: Right, Lateral Cleanser Soap and Water Discharge Instruction: May shower and wash wound with dial antibacterial soap and  Medical Record Number: [id number] Patient Account Number: [id number] Date of Birth/Sex: Treating RN: 06/09/1971 (52 y.o. [name] Primary Care [name]: Barbette Reichmann Other Clinician: Referring [name]: Treating [name]/Extender: [name] Weeks in Treatment: 13 Active Inactive Wound/Skin Impairment Nursing Diagnoses: Impaired tissue integrity Goals: Patient/caregiver will verbalize understanding of skin care regimen Date Initiated:  01/25/2023 Target Resolution Date: 05/16/2023 Goal Status: Active Interventions: Assess ulceration(s) every visit Treatment Activities: Skin care regimen initiated : 01/25/2023 Notes: Electronic Signature(s) Signed: 05/01/2023 4:07:14 PM By: Brenton Grills Entered By: Brenton Grills on 04/30/2023 14:43:54 -------------------------------------------------------------------------------- Pain Assessment Details Patient Name: Date of Service: [name] [name] Bulla K. 04/30/2023 2:30 PM Medical Record Number: [id number] Patient Account Number: [id number] Date of Birth/Sex: Treating RN: August 08, 1970 (52 y.o. [name] Primary Care [name]: Barbette Reichmann Other Clinician: Referring [name]: Treating [name]/Extender: [name] Weeks in Treatment: 13 Active Problems Location of Pain Severity and Description of Pain Patient Has Paino No Site Locations [name], [name] ([id number]) [id number] Page 6 of 10 Pain Management and Medication Current Pain Management: Electronic Signature(s) Signed: 05/01/2023 4:07:14 PM By: Brenton Grills Entered By: Brenton Grills on 04/30/2023 14:31:09 -------------------------------------------------------------------------------- Patient/Caregiver Education Details Patient Name: Date of Service: [name] 10/15/2024andnbsp2:30 PM Medical Record Number: [id number] Patient Account Number: [id number] Date of Birth/Gender: Treating RN: 1971-04-19 (52 y.o. [name] Primary Care Physician: Barbette Reichmann Other Clinician: Referring Physician: Treating Physician/Extender: Imagene Sheller in Treatment: 13 Education Assessment Education Provided To: Patient Education Topics Provided Wound/Skin Impairment: Methods: Explain/Verbal Responses: State content correctly Electronic Signature(s) Signed: 05/01/2023 4:07:14 PM By: Brenton Grills Entered By:  Brenton Grills on 04/30/2023 14:44:13 -------------------------------------------------------------------------------- Wound Assessment Details Patient Name: Date of Service: [name] [name] Bulla K. 04/30/2023 2:30 PM Medical Record Number: [id number] Patient Account Number: [id number] Date of Birth/Sex: Treating RN: 12/12/1970 (52 y.o. [name] Primary Care Marke Goodwyn: Barbette Reichmann Other Clinician: Referring Elizbeth Posa: Treating Ashira Kirsten/Extender: [facility], [place], [name] ([id number]) [id number] Page 7 of 10 Weeks in Treatment: 13 Wound Status Wound Number: 1 Primary Diabetic Wound/Ulcer of the Lower Extremity Etiology: Wound Location: Right, Lateral Lower Leg Wound Open Wounding Event: Other Lesion Status: Date Acquired: 01/01/2018 Notes: Pt. 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 Weeks Of Treatment: 13 Comorbid Anemia, Deep Vein Thrombosis, Hypertension, Peripheral Venous Clustered Wound: No History: Disease, Type II Diabetes, Gout Photos Wound Measurements Length: (cm) 4.6 Width: (cm) 1.5 Depth: (cm) 0.3 Area: (cm) 5.419 Volume: (cm) 1.626 % Reduction in Area: 58.5% % Reduction in Volume: 68.9% Epithelialization: Small (1-33%) Tunneling: No Undermining: No Wound Description Classification: Grade 2 Wound Margin: Thickened Exudate Amount: Medium Exudate Type: Serosanguineous Exudate Color: red, brown Foul Odor After Cleansing: No Slough/Fibrino Yes Wound Bed Granulation Amount: Large (67-100%) Exposed Structure Granulation Quality: Red, Pink Fascia Exposed: No Necrotic Amount: Small (1-33%) Fat Layer (Subcutaneous Tissue) Exposed: Yes Necrotic Quality: Eschar Tendon Exposed: No Muscle Exposed: No Joint Exposed: No Bone Exposed: No Periwound Skin Texture Texture Color No Abnormalities Noted: No No Abnormalities Noted: No Callus: No Atrophie Blanche: No Crepitus: No Cyanosis:  No Excoriation: No Ecchymosis: No Induration: No Erythema: No Rash: No Hemosiderin Staining: Yes Scarring: No Mottled: No Pallor: No Moisture Rubor: No No Abnormalities Noted: No Dry / Scaly: No Temperature / Pain Maceration: No Temperature: No Abnormality Treatment Notes Wound #1 (Lower Leg) Wound Laterality: Right, Lateral Cleanser Soap and Water Discharge Instruction: May shower and wash wound with dial antibacterial soap and  Medical Record Number: [id number] Patient Account Number: [id number] Date of Birth/Sex: Treating RN: 06/09/1971 (52 y.o. [name] Primary Care [name]: Barbette Reichmann Other Clinician: Referring [name]: Treating [name]/Extender: [name] Weeks in Treatment: 13 Active Inactive Wound/Skin Impairment Nursing Diagnoses: Impaired tissue integrity Goals: Patient/caregiver will verbalize understanding of skin care regimen Date Initiated:  01/25/2023 Target Resolution Date: 05/16/2023 Goal Status: Active Interventions: Assess ulceration(s) every visit Treatment Activities: Skin care regimen initiated : 01/25/2023 Notes: Electronic Signature(s) Signed: 05/01/2023 4:07:14 PM By: Brenton Grills Entered By: Brenton Grills on 04/30/2023 14:43:54 -------------------------------------------------------------------------------- Pain Assessment Details Patient Name: Date of Service: [name] [name] Bulla K. 04/30/2023 2:30 PM Medical Record Number: [id number] Patient Account Number: [id number] Date of Birth/Sex: Treating RN: August 08, 1970 (52 y.o. [name] Primary Care [name]: Barbette Reichmann Other Clinician: Referring [name]: Treating [name]/Extender: [name] Weeks in Treatment: 13 Active Problems Location of Pain Severity and Description of Pain Patient Has Paino No Site Locations [name], [name] ([id number]) [id number] Page 6 of 10 Pain Management and Medication Current Pain Management: Electronic Signature(s) Signed: 05/01/2023 4:07:14 PM By: Brenton Grills Entered By: Brenton Grills on 04/30/2023 14:31:09 -------------------------------------------------------------------------------- Patient/Caregiver Education Details Patient Name: Date of Service: [name] 10/15/2024andnbsp2:30 PM Medical Record Number: [id number] Patient Account Number: [id number] Date of Birth/Gender: Treating RN: 1971-04-19 (52 y.o. [name] Primary Care Physician: Barbette Reichmann Other Clinician: Referring Physician: Treating Physician/Extender: Imagene Sheller in Treatment: 13 Education Assessment Education Provided To: Patient Education Topics Provided Wound/Skin Impairment: Methods: Explain/Verbal Responses: State content correctly Electronic Signature(s) Signed: 05/01/2023 4:07:14 PM By: Brenton Grills Entered By:  Brenton Grills on 04/30/2023 14:44:13 -------------------------------------------------------------------------------- Wound Assessment Details Patient Name: Date of Service: [name] [name] Bulla K. 04/30/2023 2:30 PM Medical Record Number: [id number] Patient Account Number: [id number] Date of Birth/Sex: Treating RN: 12/12/1970 (52 y.o. [name] Primary Care Marke Goodwyn: Barbette Reichmann Other Clinician: Referring Elizbeth Posa: Treating Ashira Kirsten/Extender: [facility] [place], [name] ([id number]) [id number] Page 7 of 10 Weeks in Treatment: 13 Wound Status Wound Number: 1 Primary Diabetic Wound/Ulcer of the Lower Extremity Etiology: Wound Location: Right, Lateral Lower Leg Wound Open Wounding Event: Other Lesion Status: Date Acquired: 01/01/2018 Notes: Pt. 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 Weeks Of Treatment: 13 Comorbid Anemia, Deep Vein Thrombosis, Hypertension, Peripheral Venous Clustered Wound: No History: Disease, Type II Diabetes, Gout Photos Wound Measurements Length: (cm) 4.6 Width: (cm) 1.5 Depth: (cm) 0.3 Area: (cm) 5.419 Volume: (cm) 1.626 % Reduction in Area: 58.5% % Reduction in Volume: 68.9% Epithelialization: Small (1-33%) Tunneling: No Undermining: No Wound Description Classification: Grade 2 Wound Margin: Thickened Exudate Amount: Medium Exudate Type: Serosanguineous Exudate Color: red, brown Foul Odor After Cleansing: No Slough/Fibrino Yes Wound Bed Granulation Amount: Large (67-100%) Exposed Structure Granulation Quality: Red, Pink Fascia Exposed: No Necrotic Amount: Small (1-33%) Fat Layer (Subcutaneous Tissue) Exposed: Yes Necrotic Quality: Eschar Tendon Exposed: No Muscle Exposed: No Joint Exposed: No Bone Exposed: No Periwound Skin Texture Texture Color No Abnormalities Noted: No No Abnormalities Noted: No Callus: No Atrophie Blanche: No Crepitus: No Cyanosis:  No Excoriation: No Ecchymosis: No Induration: No Erythema: No Rash: No Hemosiderin Staining: Yes Scarring: No Mottled: No Pallor: No Moisture Rubor: No No Abnormalities Noted: No Dry / Scaly: No Temperature / Pain Maceration: No Temperature: No Abnormality Treatment Notes Wound #1 (Lower Leg) Wound Laterality: Right, Lateral Cleanser Soap and Water Discharge Instruction: May shower and wash wound with dial antibacterial soap and

## 2023-05-04 ENCOUNTER — Emergency Department (HOSPITAL_COMMUNITY): Payer: Medicaid Other

## 2023-05-04 ENCOUNTER — Inpatient Hospital Stay (HOSPITAL_COMMUNITY)
Admission: EM | Admit: 2023-05-04 | Discharge: 2023-05-07 | DRG: 158 | Disposition: A | Discharge status: Home / Self Care | Payer: Medicaid Other | Attending: Student | Admitting: Student

## 2023-05-04 ENCOUNTER — Other Ambulatory Visit: Payer: Self-pay

## 2023-05-04 DIAGNOSIS — R220 Localized swelling, mass and lump, head: Secondary | ICD-10-CM | POA: Diagnosis not present

## 2023-05-04 DIAGNOSIS — N179 Acute kidney failure, unspecified: Secondary | ICD-10-CM | POA: Diagnosis present

## 2023-05-04 DIAGNOSIS — R000 Tachycardia, unspecified: Secondary | ICD-10-CM | POA: Diagnosis not present

## 2023-05-04 DIAGNOSIS — E669 Obesity, unspecified: Secondary | ICD-10-CM | POA: Diagnosis not present

## 2023-05-04 DIAGNOSIS — M109 Gout, unspecified: Secondary | ICD-10-CM | POA: Diagnosis present

## 2023-05-04 DIAGNOSIS — E1122 Type 2 diabetes mellitus with diabetic chronic kidney disease: Secondary | ICD-10-CM | POA: Diagnosis present

## 2023-05-04 DIAGNOSIS — K122 Cellulitis and abscess of mouth: Secondary | ICD-10-CM | POA: Diagnosis not present

## 2023-05-04 DIAGNOSIS — Z8249 Family history of ischemic heart disease and other diseases of the circulatory system: Secondary | ICD-10-CM

## 2023-05-04 DIAGNOSIS — L97219 Non-pressure chronic ulcer of right calf with unspecified severity: Secondary | ICD-10-CM | POA: Diagnosis present

## 2023-05-04 DIAGNOSIS — E11622 Type 2 diabetes mellitus with other skin ulcer: Secondary | ICD-10-CM | POA: Diagnosis not present

## 2023-05-04 DIAGNOSIS — J029 Acute pharyngitis, unspecified: Secondary | ICD-10-CM | POA: Diagnosis not present

## 2023-05-04 DIAGNOSIS — Z841 Family history of disorders of kidney and ureter: Secondary | ICD-10-CM

## 2023-05-04 DIAGNOSIS — Z86718 Personal history of other venous thrombosis and embolism: Secondary | ICD-10-CM | POA: Diagnosis not present

## 2023-05-04 DIAGNOSIS — Z1152 Encounter for screening for COVID-19: Secondary | ICD-10-CM

## 2023-05-04 DIAGNOSIS — E11628 Type 2 diabetes mellitus with other skin complications: Secondary | ICD-10-CM

## 2023-05-04 DIAGNOSIS — Z794 Long term (current) use of insulin: Secondary | ICD-10-CM | POA: Diagnosis not present

## 2023-05-04 DIAGNOSIS — Z823 Family history of stroke: Secondary | ICD-10-CM | POA: Diagnosis not present

## 2023-05-04 DIAGNOSIS — I129 Hypertensive chronic kidney disease with stage 1 through stage 4 chronic kidney disease, or unspecified chronic kidney disease: Secondary | ICD-10-CM | POA: Diagnosis not present

## 2023-05-04 DIAGNOSIS — N1831 Chronic kidney disease, stage 3a: Secondary | ICD-10-CM | POA: Diagnosis present

## 2023-05-04 DIAGNOSIS — Z79899 Other long term (current) drug therapy: Secondary | ICD-10-CM | POA: Diagnosis not present

## 2023-05-04 DIAGNOSIS — Z8489 Family history of other specified conditions: Secondary | ICD-10-CM | POA: Diagnosis not present

## 2023-05-04 DIAGNOSIS — Z833 Family history of diabetes mellitus: Secondary | ICD-10-CM | POA: Diagnosis not present

## 2023-05-04 DIAGNOSIS — K029 Dental caries, unspecified: Secondary | ICD-10-CM | POA: Diagnosis not present

## 2023-05-04 DIAGNOSIS — Z6836 Body mass index (BMI) 36.0-36.9, adult: Secondary | ICD-10-CM

## 2023-05-04 DIAGNOSIS — Z7901 Long term (current) use of anticoagulants: Secondary | ICD-10-CM

## 2023-05-04 DIAGNOSIS — R0602 Shortness of breath: Secondary | ICD-10-CM | POA: Diagnosis not present

## 2023-05-04 DIAGNOSIS — L03211 Cellulitis of face: Secondary | ICD-10-CM | POA: Diagnosis not present

## 2023-05-04 DIAGNOSIS — Z8673 Personal history of transient ischemic attack (TIA), and cerebral infarction without residual deficits: Secondary | ICD-10-CM

## 2023-05-04 DIAGNOSIS — E119 Type 2 diabetes mellitus without complications: Secondary | ICD-10-CM

## 2023-05-04 LAB — CBC WITH DIFFERENTIAL/PLATELET
Abs Immature Granulocytes: 0.02 10*3/uL (ref 0.00–0.07)
Basophils Absolute: 0 10*3/uL (ref 0.0–0.1)
Basophils Relative: 0 %
Eosinophils Absolute: 0 10*3/uL (ref 0.0–0.5)
Eosinophils Relative: 0 %
HCT: 48.4 % (ref 39.0–52.0)
Hemoglobin: 15.8 g/dL (ref 13.0–17.0)
Immature Granulocytes: 0 %
Lymphocytes Relative: 17 %
Lymphs Abs: 1.2 10*3/uL (ref 0.7–4.0)
MCH: 29.4 pg (ref 26.0–34.0)
MCHC: 32.6 g/dL (ref 30.0–36.0)
MCV: 90 fL (ref 80.0–100.0)
Monocytes Absolute: 0.7 10*3/uL (ref 0.1–1.0)
Monocytes Relative: 10 %
Neutro Abs: 5 10*3/uL (ref 1.7–7.7)
Neutrophils Relative %: 73 %
Platelets: 248 10*3/uL (ref 150–400)
RBC: 5.38 MIL/uL (ref 4.22–5.81)
RDW: 13.5 % (ref 11.5–15.5)
WBC: 6.8 10*3/uL (ref 4.0–10.5)
nRBC: 0 % (ref 0.0–0.2)

## 2023-05-04 LAB — COMPREHENSIVE METABOLIC PANEL
ALT: 14 U/L (ref 0–44)
AST: 14 U/L — ABNORMAL LOW (ref 15–41)
Albumin: 4 g/dL (ref 3.5–5.0)
Alkaline Phosphatase: 74 U/L (ref 38–126)
Anion gap: 10 (ref 5–15)
BUN: 18 mg/dL (ref 6–20)
CO2: 23 mmol/L (ref 22–32)
Calcium: 9 mg/dL (ref 8.9–10.3)
Chloride: 102 mmol/L (ref 98–111)
Creatinine, Ser: 1.56 mg/dL — ABNORMAL HIGH (ref 0.61–1.24)
GFR, Estimated: 53 mL/min — ABNORMAL LOW (ref >60)
Glucose, Bld: 115 mg/dL — ABNORMAL HIGH (ref 70–99)
Potassium: 3.7 mmol/L (ref 3.5–5.1)
Sodium: 135 mmol/L (ref 135–145)
Total Bilirubin: 1.2 mg/dL (ref 0.3–1.2)
Total Protein: 8 g/dL (ref 6.5–8.1)

## 2023-05-04 LAB — TROPONIN I (HIGH SENSITIVITY)
Troponin I (High Sensitivity): 14 ng/L (ref <18)
Troponin I (High Sensitivity): 13 ng/L (ref <18)

## 2023-05-04 LAB — RESP PANEL BY RT-PCR (RSV, FLU A&B, COVID)  RVPGX2
Influenza A by PCR: NEGATIVE
Influenza B by PCR: NEGATIVE
Resp Syncytial Virus by PCR: NEGATIVE
SARS Coronavirus 2 by RT PCR: NEGATIVE

## 2023-05-04 LAB — BRAIN NATRIURETIC PEPTIDE: B Natriuretic Peptide: 74 pg/mL (ref 0.0–100.0)

## 2023-05-04 MED ORDER — SODIUM CHLORIDE 0.9 % IV SOLN
3.0000 g | Freq: Once | INTRAVENOUS | Status: AC
  Administered 2023-05-04: 3 g via INTRAVENOUS
  Filled 2023-05-04: qty 8

## 2023-05-04 MED ORDER — ALBUTEROL SULFATE HFA 108 (90 BASE) MCG/ACT IN AERS: 2.0000 | INHALATION_SPRAY | RESPIRATORY_TRACT | Status: DC | PRN

## 2023-05-04 MED ORDER — ENOXAPARIN SODIUM 40 MG/0.4ML IJ SOSY
40.0000 mg | PREFILLED_SYRINGE | INTRAMUSCULAR | Status: DC
  Administered 2023-05-04 – 2023-05-05 (×2): 40 mg via SUBCUTANEOUS
  Filled 2023-05-04 (×2): qty 0.4

## 2023-05-04 MED ORDER — ACETAMINOPHEN 650 MG RE SUPP: 650.0000 mg | Freq: Four times a day (QID) | RECTAL | Status: DC | PRN

## 2023-05-04 MED ORDER — SODIUM CHLORIDE 0.9 % IV SOLN
3.0000 g | Freq: Four times a day (QID) | INTRAVENOUS | Status: DC
  Administered 2023-05-04 – 2023-05-07 (×11): 3 g via INTRAVENOUS
  Filled 2023-05-04 (×11): qty 8

## 2023-05-04 MED ORDER — IOHEXOL 300 MG/ML  SOLN
75.0000 mL | Freq: Once | INTRAMUSCULAR | Status: AC | PRN
  Administered 2023-05-04: 75 mL via INTRAVENOUS

## 2023-05-04 MED ORDER — ONDANSETRON HCL 4 MG PO TABS: 4.0000 mg | ORAL_TABLET | Freq: Four times a day (QID) | ORAL | Status: DC | PRN

## 2023-05-04 MED ORDER — SENNOSIDES-DOCUSATE SODIUM 8.6-50 MG PO TABS: 1.0000 | ORAL_TABLET | Freq: Every evening | ORAL | Status: DC | PRN

## 2023-05-04 MED ORDER — ONDANSETRON HCL 4 MG/2ML IJ SOLN: 4.0000 mg | Freq: Four times a day (QID) | INTRAMUSCULAR | Status: DC | PRN

## 2023-05-04 MED ORDER — ACETAMINOPHEN 325 MG PO TABS: 650.0000 mg | ORAL_TABLET | Freq: Four times a day (QID) | ORAL | Status: DC | PRN

## 2023-05-04 NOTE — ED Notes (Signed)
[facility] Transfer Line per Dr. Gwenette Greet.

## 2023-05-04 NOTE — H&P (Signed)
History and Physical    Patient: [name] [id number] DOB: 05-15-1971 DOA: 05/04/2023 DOS: the patient was seen and examined on 05/04/2023 PCP: Barbette Reichmann, MD  Patient coming from: {Point_of_Origin:[address]}  Chief Complaint:  Chief Complaint  Patient presents with   Shortness of Breath   Jaw Pain   HPI: [name] is a 52 y.o. male with medical history significant of ***  Review of Systems: {ROS_Text:26778} Past Medical History:  Diagnosis Date   Acute blood loss as cause of postoperative anemia 04/18/2017   ARF (acute renal failure) (HCC)    Cellulitis of right lower leg 03/31/2017   Chronic anticoagulation 03/31/2017   Diabetes mellitus without complication (HCC)    Diabetic ulcer of calf (HCC) 03/31/2017   DVT of proximal lower limb (HCC) 03/20/2017   R calf area   Escherichia coli sepsis (HCC) 04/18/2017   Gout    History of hiatal hernia    Hypertension    Hypoalbuminemia 03/31/2017   Necrotizing fasciitis (HCC)    Right leg DVT (HCC) 03/31/2017   Sepsis (HCC) 03/30/2017   Sinus tachycardia 03/31/2017   Type 2 diabetes mellitus with stage 2 chronic kidney disease, with long-term current use of insulin (HCC) 03/27/2017   Past Surgical History:  Procedure Laterality Date   COLONOSCOPY WITH PROPOFOL N/A 02/10/2018   Procedure: COLONOSCOPY WITH PROPOFOL;  Surgeon: Toney Reil, MD;  Location: [facility] ENDOSCOPY;  Service: Gastroenterology;  Laterality: N/A;   COLONOSCOPY WITH PROPOFOL N/A 02/07/2023   Procedure: COLONOSCOPY WITH PROPOFOL;  Surgeon: Jaynie Collins, DO;  Location: [facility] ENDOSCOPY;  Service: Gastroenterology;  Laterality: N/A;   ELBOW SURGERY Right    DUE TO INFECTION   ESOPHAGOGASTRODUODENOSCOPY (EGD) WITH PROPOFOL N/A 02/10/2018   Procedure: ESOPHAGOGASTRODUODENOSCOPY (EGD) WITH PROPOFOL;  Surgeon: Toney Reil, MD;  Location[facility] ENDOSCOPY;  Service: Gastroenterology;  Laterality: N/A;   ESOPHAGOGASTRODUODENOSCOPY (EGD)  WITH PROPOFOL N/A 04/14/2020   Procedure: ESOPHAGOGASTRODUODENOSCOPY (EGD) WITH PROPOFOL;  Surgeon: Toney Reil, MD;  Location: [facility] SURGERY [facility];  Service: Endoscopy;  Laterality: N/A;  diabetic - insulin   I & D EXTREMITY Right 04/03/2017   Procedure: IRRIGATION AND DEBRIDEMENT RIGHT LEG, APPLY WOUND VAC;  Surgeon: Nadara Mustard, MD;  Location: [facility] OR;  Service: Orthopedics;  Laterality: Right;   I & D EXTREMITY Right 04/05/2017   Procedure: IRRIGATION AND DEBRIDEMENT RIGHT LEG;  Surgeon: Nadara Mustard, MD;  Location: [facility] OR;  Service: Orthopedics;  Laterality: Right;   IRRIGATION AND DEBRIDEMENT ABSCESS Right 12/30/2018   Procedure: MINOR INCISION AND DRAINAGE OF ABSCESS;  Surgeon: Ernest Mallick, MD;  Location: [facility] ORS;  Service: Orthopedics;  Laterality: Right;   SCROTAL SURGERY  2006   SKIN SPLIT GRAFT Right 04/10/2017   Procedure: SKIN GRAFT SPLIT THICKNESS RIGHT LEG;  Surgeon: Nadara Mustard, MD;  Location: [facility] OR;  Service: Orthopedics;  Laterality: Right;   XI ROBOTIC ASSISTED INGUINAL HERNIA REPAIR WITH MESH Bilateral 09/01/2020   Procedure: XI ROBOTIC ASSISTED INGUINAL HERNIA REPAIR WITH MESH;  Surgeon: Sung Amabile, DO;  Location: [facility] ORS;  Service: General;  Laterality: Bilateral;   Social History:  reports that he has never smoked. He has never used smokeless tobacco. He reports that he does not drink alcohol and does not use drugs.  No Known Allergies  Family History  Problem Relation Age of Onset   Hypertension Mother    Stroke Mother    Diabetes Father    Renal Disease Father  Hypertension Sister    Diabetes Sister    Renal Disease Sister    Iron deficiency Sister     Prior to Admission medications   Medication Sig Start Date End Date Taking? Authorizing Provider  ACCU-CHEK FASTCLIX LANCETS MISC U UTD TO CHECK BLOOD SUGAR TID 01/13/18   [provider]  ACCU-CHEK GUIDE test strip USE TO TEST QID UTD 12/13/17   [provider]   acetaminophen (TYLENOL) 325 MG tablet Take 2 tablets (650 mg total) by mouth every 6 (six) hours as needed for up to 30 doses. 09/28/21   Terald Sleeper, MD  amLODipine (NORVASC) 10 MG tablet Take 10 mg by mouth every morning.    [provider]  colchicine 0.6 MG tablet Take 1 tablet (0.6 mg total) by mouth daily. 09/28/21   Tilden Fossa, MD  doxycycline (VIBRAMYCIN) 100 MG capsule Take 1 capsule (100 mg total) by mouth 2 (two) times daily. 11/11/22   Burgess Amor, PA-C  glucose blood test strip Use 4 (four) times daily Use as instructed. 11/13/17   [provider]  hydrALAZINE (APRESOLINE) 100 MG tablet Take 100 mg by mouth 3 (three) times daily. 12/25/19   [provider]  hydrochlorothiazide (HYDRODIURIL) 25 MG tablet Take 25 mg by mouth every morning. 05/08/18   [provider]  ibuprofen (ADVIL) 800 MG tablet Take 1 tablet (800 mg total) by mouth every 8 (eight) hours as needed for mild pain or moderate pain. 09/01/20   Tonna Boehringer, Isami, DO  insulin aspart (NOVOLOG) 100 UNIT/ML FlexPen Inject 10-15 Units into the skin 4 (four) times daily. Sliding scale    [provider]  lisinopril (PRINIVIL,ZESTRIL) 40 MG tablet Take 40 mg by mouth every morning. 05/09/18   [provider]  metoprolol tartrate (LOPRESSOR) 100 MG tablet Take 1 tablet (100 mg total) by mouth 2 (two) times daily. 04/12/17   Kathlen Mody, MD  oxyCODONE (ROXICODONE) 5 MG immediate release tablet Take 1 tablet (5 mg total) by mouth every 6 (six) hours as needed for up to 15 doses for severe pain. 09/28/21   Terald Sleeper, MD    Physical Exam: Vitals:   05/04/23 1200 05/04/23 1230 05/04/23 1242 05/04/23 1636  BP: (!) 159/104 (!) 155/95  (!) 146/96  Pulse: 81 84  90  Resp: 20 16  18   Temp:   98.7 F (37.1 C) 98.1 F (36.7 C)  TempSrc:   Oral Oral  SpO2: 98% 98%  97%  Weight:      Height:       *** Data Reviewed: {Tip this will not be part of the note when signed-  Document your independent interpretation of telemetry tracing, EKG, lab, Radiology test or any other diagnostic tests. Add any new diagnostic test ordered today. (Optional):26781} {Results:26384}  Assessment and Plan: No notes have been filed under this hospital service. Service: Hospitalist     Advance Care Planning:   Code Status: Full Code ***  Consults: ***  Family Communication: ***  Severity of Illness: {Observation/Inpatient:21159}  Author: Steffanie Rainwater, MD 05/04/2023 8:35 PM  For on call review [email or web address].

## 2023-05-04 NOTE — ED Provider Notes (Signed)
[place] [facility] AT [facility] Provider Note  CSN: [id number] Arrival date & time: 05/04/23 0747  Chief Complaint(s) Shortness of Breath and Jaw Pain  HPI [name] is a 52 y.o. male with past medical history as below, significant for CKD, DM, DVT on anticoagulation, hypertension, CVA who presents to the ED with complaint of left-sided jaw pain and swelling, dyspnea  Having discomfort over the past 2 to 3 days.  Left-sided jaw pain with worsening swelling associated with dental pain, pain with chewing on the left side.  He has dental abnormalities on the left side chronically.  No difficulty swallowing or speaking.  Ports he has some exertional dyspnea with ambulation up and down stairs over the past few days.  No fevers.  No chest pain.  No abnormal weight gain over the past week or so.  No sick contacts recent travel.  No fevers or chills, no coughing.  Past Medical History Past Medical History:  Diagnosis Date   Acute blood loss as cause of postoperative anemia 04/18/2017   ARF (acute renal failure) (HCC)    Cellulitis of right lower leg 03/31/2017   Chronic anticoagulation 03/31/2017   Diabetes mellitus without complication (HCC)    Diabetic ulcer of calf (HCC) 03/31/2017   DVT of proximal lower limb (HCC) 03/20/2017   R calf area   Escherichia coli sepsis (HCC) 04/18/2017   Gout    History of hiatal hernia    Hypertension    Hypoalbuminemia 03/31/2017   Necrotizing fasciitis (HCC)    Right leg DVT (HCC) 03/31/2017   Sepsis (HCC) 03/30/2017   Sinus tachycardia 03/31/2017   Type 2 diabetes mellitus with stage 2 chronic kidney disease, with long-term current use of insulin (HCC) 03/27/2017   Patient Active Problem List   Diagnosis Date Noted   Acute CVA (cerebrovascular accident) (HCC) 11/26/2020   CKD (chronic kidney disease) 11/26/2020   Abdominal pain, chronic, epigastric    Arthralgia of right elbow 01/13/2019   Venous stasis ulcer (HCC)  12/31/2018   Chronic kidney disease (CKD) stage G2/A2, mildly decreased glomerular filtration rate (GFR) between 60-89 mL/min/1.73 square meter and albuminuria creatinine ratio between 30-299 mg/g 12/31/2018   Septic arthritis of elbow, right (HCC) 12/30/2018   Chronic venous insufficiency 02/06/2018   Chronic anticoagulation 03/31/2017   Type 2 diabetes mellitus with stage 2 chronic kidney disease, with long-term current use of insulin (HCC) 03/27/2017   History of DVT (deep vein thrombosis) 03/27/2017   Essential hypertension 01/28/2008   Home Medication(s) Prior to Admission medications   Medication Sig Start Date End Date Taking? Authorizing Provider  ACCU-CHEK FASTCLIX LANCETS MISC U UTD TO CHECK BLOOD SUGAR TID 01/13/18   [provider]  ACCU-CHEK GUIDE test strip USE TO TEST QID UTD 12/13/17   [provider]  acetaminophen (TYLENOL) 325 MG tablet Take 2 tablets (650 mg total) by mouth every 6 (six) hours as needed for up to 30 doses. 09/28/21   Terald Sleeper, MD  amLODipine (NORVASC) 10 MG tablet Take 10 mg by mouth every morning.    [provider]  colchicine 0.6 MG tablet Take 1 tablet (0.6 mg total) by mouth daily. 09/28/21   Tilden Fossa, MD  doxycycline (VIBRAMYCIN) 100 MG capsule Take 1 capsule (100 mg total) by mouth 2 (two) times daily. 11/11/22   Burgess Amor, PA-C  glucose blood test strip Use 4 (four) times daily Use as instructed. 11/13/17   [provider]  hydrALAZINE (APRESOLINE)  100 MG tablet Take 100 mg by mouth 3 (three) times daily. 12/25/19   [provider]  hydrochlorothiazide (HYDRODIURIL) 25 MG tablet Take 25 mg by mouth every morning. 05/08/18   [provider]  ibuprofen (ADVIL) 800 MG tablet Take 1 tablet (800 mg total) by mouth every 8 (eight) hours as needed for mild pain or moderate pain. 09/01/20   Tonna Boehringer, Isami, DO  insulin aspart (NOVOLOG) 100 UNIT/ML FlexPen Inject 10-15 Units into the skin 4 (four)  times daily. Sliding scale    [provider]  lisinopril (PRINIVIL,ZESTRIL) 40 MG tablet Take 40 mg by mouth every morning. 05/09/18   [provider]  metoprolol tartrate (LOPRESSOR) 100 MG tablet Take 1 tablet (100 mg total) by mouth 2 (two) times daily. 04/12/17   Kathlen Mody, MD  oxyCODONE (ROXICODONE) 5 MG immediate release tablet Take 1 tablet (5 mg total) by mouth every 6 (six) hours as needed for up to 15 doses for severe pain. 09/28/21   Terald Sleeper, MD                                                                                                                                    Past Surgical History Past Surgical History:  Procedure Laterality Date   COLONOSCOPY WITH PROPOFOL N/A 02/10/2018   Procedure: COLONOSCOPY WITH PROPOFOL;  Surgeon: Toney Reil, MD;  Location[facility] ENDOSCOPY;  Service: Gastroenterology;  Laterality: N/A;   COLONOSCOPY WITH PROPOFOL N/A 02/07/2023   Procedure: COLONOSCOPY WITH PROPOFOL;  Surgeon: Jaynie Collins, DO;  Location: [facility] ENDOSCOPY;  Service: Gastroenterology;  Laterality: N/A;   ELBOW SURGERY Right    DUE TO INFECTION   ESOPHAGOGASTRODUODENOSCOPY (EGD) WITH PROPOFOL N/A 02/10/2018   Procedure: ESOPHAGOGASTRODUODENOSCOPY (EGD) WITH PROPOFOL;  Surgeon: Toney Reil, MD;  Location: [facility] ENDOSCOPY;  Service: Gastroenterology;  Laterality: N/A;   ESOPHAGOGASTRODUODENOSCOPY (EGD) WITH PROPOFOL N/A 04/14/2020   Procedure: ESOPHAGOGASTRODUODENOSCOPY (EGD) WITH PROPOFOL;  Surgeon: Toney Reil, MD;  Location: [facility];  Service: Endoscopy;  Laterality: N/A;  diabetic - insulin   I & D EXTREMITY Right 04/03/2017   Procedure: IRRIGATION AND DEBRIDEMENT RIGHT LEG, APPLY WOUND VAC;  Surgeon: Nadara Mustard, MD;  Location: [facility] OR;  Service: Orthopedics;  Laterality: Right;   I & D EXTREMITY Right 04/05/2017   Procedure: IRRIGATION AND DEBRIDEMENT RIGHT LEG;  Surgeon: Nadara Mustard, MD;  Location: [facility] OR;   Service: Orthopedics;  Laterality: Right;   IRRIGATION AND DEBRIDEMENT ABSCESS Right 12/30/2018   Procedure: MINOR INCISION AND DRAINAGE OF ABSCESS;  Surgeon: Ernest Mallick, MD;  Location: [facility] ORS;  Service: Orthopedics;  Laterality: Right;   SCROTAL SURGERY  2006   SKIN SPLIT GRAFT Right 04/10/2017   Procedure: SKIN GRAFT SPLIT THICKNESS RIGHT LEG;  Surgeon: Nadara Mustard, MD;  Location: [facility] OR;  Service: Orthopedics;  Laterality: Right;   XI ROBOTIC ASSISTED INGUINAL HERNIA  REPAIR WITH MESH Bilateral 09/01/2020   Procedure: XI ROBOTIC ASSISTED INGUINAL HERNIA REPAIR WITH MESH;  Surgeon: Sung Amabile, DO;  Location: [facility] ORS;  Service: General;  Laterality: Bilateral;   Family History Family History  Problem Relation Age of Onset   Hypertension Mother    Stroke Mother    Diabetes Father    Renal Disease Father    Hypertension Sister    Diabetes Sister    Renal Disease Sister    Iron deficiency Sister     Social History Social History   Tobacco Use   Smoking status: Never   Smokeless tobacco: Never  Vaping Use   Vaping status: Never Used  Substance Use Topics   Alcohol use: No   Drug use: No   Allergies Patient has no known allergies.  Review of Systems Review of Systems  Constitutional:  Negative for fever.  HENT:  Positive for dental problem and facial swelling.   Eyes:  Negative for visual disturbance.  Respiratory:  Positive for shortness of breath. Negative for chest tightness.   Cardiovascular:  Negative for chest pain and palpitations.  Gastrointestinal:  Negative for abdominal pain, nausea and vomiting.  Genitourinary:  Negative for dysuria and urgency.  Musculoskeletal:  Negative for back pain.  Neurological:  Negative for numbness and headaches.  All other systems reviewed and are negative.   Physical Exam Vital Signs  I have reviewed the triage vital signs BP (!) 146/96 (BP Location: Right Arm)   Pulse 90   Temp 98.1 F (36.7 C) (Oral)   Resp  18   Ht 5\' 11"  (1.803 m)   Wt 119.3 kg   SpO2 97%   BMI 36.68 kg/m  Physical Exam Vitals and nursing note reviewed.  Constitutional:      General: He is not in acute distress.    Appearance: He is well-developed. He is obese.  HENT:     Head: Normocephalic and atraumatic. No raccoon eyes, Battle's sign, right periorbital erythema or left periorbital erythema.     Jaw: No trismus.      Comments: No angioedema, sublingual space is soft  Swelling noted left side of mandible into inframandibular space.   No tongue swelling  No drooling stridor or trismus    Right Ear: External ear normal.     Left Ear: External ear normal.     Mouth/Throat:     Mouth: Mucous membranes are moist.     Dentition: Abnormal dentition. Dental tenderness and dental caries present.     Pharynx: Oropharynx is clear. Uvula midline. No uvula swelling.     Tonsils: No tonsillar abscesses.      Comments: Poor dentition, no evidence of abscess  Eyes:     General: No scleral icterus. Cardiovascular:     Rate and Rhythm: Normal rate and regular rhythm.     Pulses: Normal pulses.     Heart sounds: Normal heart sounds.  Pulmonary:     Effort: Pulmonary effort is normal. No respiratory distress.     Breath sounds: Normal breath sounds.  Abdominal:     General: Abdomen is flat.     Palpations: Abdomen is soft.     Tenderness: There is no abdominal tenderness.  Musculoskeletal:     Cervical back: No rigidity.     Right lower leg: No edema.     Left lower leg: No edema.  Skin:    General: Skin is warm and dry.     Capillary Refill: Capillary refill takes  less than 2 seconds.  Neurological:     Mental Status: He is alert.  Psychiatric:        Mood and Affect: Mood normal.        Behavior: Behavior normal.     ED Results and Treatments Labs (all labs ordered are listed, but only abnormal results are displayed) Labs Reviewed  COMPREHENSIVE METABOLIC PANEL - Abnormal; Notable for the following  components:      Result Value   Glucose, Bld 115 (*)    Creatinine, Ser 1.56 (*)    AST 14 (*)    GFR, Estimated 53 (*)    All other components within normal limits  RESP PANEL BY RT-PCR (RSV, FLU A&B, COVID)  RVPGX2  BRAIN NATRIURETIC PEPTIDE  CBC WITH DIFFERENTIAL/PLATELET  TROPONIN I (HIGH SENSITIVITY)  TROPONIN I (HIGH SENSITIVITY)                                                                                                                          Radiology CT Soft Tissue Neck W Contrast  Result Date: 05/04/2023 CLINICAL DATA:  Evaluate for mandibular abscess. EXAM: CT NECK WITH CONTRAST TECHNIQUE: Multidetector CT imaging of the neck was performed using the standard protocol following the bolus administration of intravenous contrast. RADIATION DOSE REDUCTION: This exam was performed according to the departmental dose-optimization program which includes automated exposure control, adjustment of the mA and/or kV according to patient size and/or use of iterative reconstruction technique. CONTRAST:  75mL OMNIPAQUE IOHEXOL 300 MG/ML  SOLN COMPARISON:  None Available. FINDINGS: Pharynx and larynx: Left submandibular fat inflammation related to periapical erosion with lingual sided dehiscence at tooth 18. Mass effect on the floor of mouth is mild. Some inflammation is likely present in the left sublingual space. No abscess. Salivary glands: No acute finding Thyroid: Normal. Lymph nodes: None enlarged or abnormal density. Vascular: Negative. Limited intracranial: Negative. Visualized orbits: Negative. Mastoids and visualized paranasal sinuses: Clear. Skeleton: C5/6 and C6/7 prominent degenerative spurring crowding the cord Upper chest: Negative. IMPRESSION: Submandibular (and likely sublingual) cellulitis on the left related to dehiscent periapical erosion affecting tooth 18. no abscess. Electronically Signed   By: Tiburcio Pea M.D.   On: 05/04/2023 10:53   CT Maxillofacial W  Contrast  Result Date: 05/04/2023 CLINICAL DATA:  Facial swelling. EXAM: CT MAXILLOFACIAL WITH CONTRAST TECHNIQUE: Multidetector CT imaging of the maxillofacial structures was performed with intravenous contrast. Multiplanar CT image reconstructions were also generated. RADIATION DOSE REDUCTION: This exam was performed according to the departmental dose-optimization program which includes automated exposure control, adjustment of the mA and/or kV according to patient size and/or use of iterative reconstruction technique. CONTRAST:  75mL OMNIPAQUE IOHEXOL 300 MG/ML  SOLN COMPARISON:  None Available. FINDINGS: Osseous: Dental caries. The left lower second molar has a large periapical erosion with lingual cortical dehiscence. In the left submandibular space there is subtle low-density appearance without well-defined collection. Mild left sublingual space swelling is likely. Mild mass effect on the  floor of mouth structures. Orbits: Negative Sinuses: Retention cyst in the lower maxillary sinuses. Soft tissues: As above. Limited intracranial: Negative IMPRESSION: Odontogenic soft tissue infection in the left submandibular (and likely sublingual) space related to tooth number 18. No drainable collection. Mass effect on the floor of mouth is mild. Electronically Signed   By: Tiburcio Pea M.D.   On: 05/04/2023 10:33   DG Chest 2 View  Result Date: 05/04/2023 CLINICAL DATA:  Shortness of breath. EXAM: CHEST - 2 VIEW COMPARISON:  12/25/2019. FINDINGS: Bilateral lung fields are clear. Elevated right hemidiaphragm again seen. Bilateral costophrenic angles are clear. Normal cardio-mediastinal silhouette. No acute osseous abnormalities. The soft tissues are within normal limits. IMPRESSION: 1. No active cardiopulmonary disease. Electronically Signed   By: Jules Schick M.D.   On: 05/04/2023 10:16    Pertinent labs & imaging results that were available during my care of the patient were reviewed by me and considered  in my medical decision making (see MDM for details).  Medications Ordered in ED Medications  albuterol (VENTOLIN HFA) 108 (90 Base) MCG/ACT inhaler 2 puff (has no administration in time range)  Ampicillin-Sulbactam (UNASYN) 3 g in sodium chloride 0.9 % 100 mL IVPB (has no administration in time range)  iohexol (OMNIPAQUE) 300 MG/ML solution 75 mL (75 mLs Intravenous Contrast Given 05/04/23 0944)  Ampicillin-Sulbactam (UNASYN) 3 g in sodium chloride 0.9 % 100 mL IVPB (0 g Intravenous Stopped 05/04/23 1511)                                                                                                                                     Procedures Procedures  CRITICAL CARE Performed by: Sloan Leiter   Total critical care time: 30 minutes  Critical care time was exclusive of separately billable procedures and treating other patients.  Critical care was necessary to treat or prevent imminent or life-threatening deterioration.  Critical care was time spent personally by me on the following activities: development of treatment plan with patient and/or surrogate as well as nursing, discussions with consultants, evaluation of patient's response to treatment, examination of patient, obtaining history from patient or surrogate, ordering and performing treatments and interventions, ordering and review of laboratory studies, ordering and review of radiographic studies, pulse oximetry and re-evaluation of patient's condition.  (including critical care time)  Medical Decision Making / ED Course    Medical Decision Making:    [name] is a 52 y.o. male with past medical history as below, significant for CKD, DM, DVT on anticoagulation, hypertension, CVA who presents to the ED with complaint of left-sided jaw pain and swelling, dyspnea. The complaint involves an extensive differential diagnosis and also carries with it a high risk of complications and morbidity.  Serious etiology was  considered. Ddx includes but is not limited to: In my evaluation of this patient's dyspnea my DDx includes, but is not limited to, pneumonia, pulmonary  embolism, pneumothorax, pulmonary edema, metabolic acidosis, asthma, COPD, cardiac cause, anemia, anxiety, dental infection, dental abscess, osteomyelitis, etc.    Complete initial physical exam performed, notably the patient  was NAD, no hypoxia.    Reviewed and confirmed nursing documentation for past medical history, family history, social history.  Vital signs reviewed.    Clinical Course as of 05/04/23 1653  Sat May 04, 2023  1207 CT with cellulitis likely odontogenic, no drainable abscess.  Will start on antibiotics.  He is not septic.  He is tolerant p.o. intake with difficulty.  Recommend outpatient follow-up with dentist and PCP [SG]  1315 Spoke with ENT Dr Ashok Croon who recommends transfer to facility that has OMFS ER to ER for evaluation  [SG]  1335 Concern pos developing ludwig angina, see note from ENT, recommend txfr [SG]  1358 Pt agreeable w/ transfer  [SG]  1414 Dr Ventura Bruns, no bed available. Recommend either admit locally with abx until bed available or try different facility.  [SG]  1519 Awaiting callback from [facility] [SG]  1649 Spoke to [facility], no dental surg avail, recommend [facility].  [SG]  1649 Awaiting rpt callback from [facility] for bed placement. Continue Abx, he is well appearing, non-toxic, airway patent. No increased swelling.  [SG]    Clinical Course User Index [SG] Tanda Rockers A, DO     Labs/imaging reviewed  Submandibular cellulitis, concern for possibly developing Ludwig's angina per ENT as noted in ED course.  Unasyn in process.  [facility] will [facility] need [facility].  Pending bed placement.  Not available to transfer to [facility] or [facility] Signed out to incoming team pending callback from [facility] and eventual transfer; he is HDS, on ambient air, swelling slightly improved since arrival.                   Additional history obtained: -Additional history obtained from na -External records from outside source obtained and reviewed including: Chart review including previous notes, labs, imaging, consultation notes including  Primary care documentation, home medications   Lab Tests: -I ordered, reviewed, and interpreted labs.   The pertinent results include:   Labs Reviewed  COMPREHENSIVE METABOLIC PANEL - Abnormal; Notable for the following components:      Result Value   Glucose, Bld 115 (*)    Creatinine, Ser 1.56 (*)    AST 14 (*)    GFR, Estimated 53 (*)    All other components within normal limits  RESP PANEL BY RT-PCR (RSV, FLU A&B, COVID)  RVPGX2  BRAIN NATRIURETIC PEPTIDE  CBC WITH DIFFERENTIAL/PLATELET  TROPONIN I (HIGH SENSITIVITY)  TROPONIN I (HIGH SENSITIVITY)    Notable for labs stable  EKG   EKG Interpretation Date/Time:  Saturday May 04 2023 08:59:18 EDT Ventricular Rate:  73 PR Interval:  175 QRS Duration:  92 QT Interval:  396 QTC Calculation: 437 R Axis:   15  Text Interpretation: Sinus rhythm Borderline T wave abnormalities Confirmed by Tanda Rockers ([id number]) on 05/04/2023 10:15:58 AM         Imaging Studies ordered: I ordered imaging studies including CT neck, CT face, chest x-ray I independently visualized the following imaging with scope of interpretation limited to determining acute life threatening conditions related to emergency care; findings noted above I independently visualized and interpreted imaging. I agree with the radiologist interpretation   Medicines ordered and prescription drug management: Meds ordered this encounter  Medications   albuterol (VENTOLIN HFA) 108 (90 Base) MCG/ACT inhaler 2 puff  iohexol (OMNIPAQUE) 300 MG/ML solution 75 mL   Ampicillin-Sulbactam (UNASYN) 3 g in sodium chloride 0.9 % 100 mL IVPB    Order Specific Question:   Antibiotic Indication:    Answer:   Other Indication  (list below)    Order Specific Question:   Other Indication:    Answer:   oral infection   Ampicillin-Sulbactam (UNASYN) 3 g in sodium chloride 0.9 % 100 mL IVPB    Order Specific Question:   Antibiotic Indication:    Answer:   Other Indication (list below)    Order Specific Question:   Other Indication:    Answer:   oral infection    -I have reviewed the patients home medicines and have made adjustments as needed   Consultations Obtained: I requested consultation with the ENT,  and discussed lab and imaging findings as well as pertinent plan - they recommend: Transfer   Cardiac Monitoring: The patient was maintained on a cardiac monitor.  I personally viewed and interpreted the cardiac monitored which showed an underlying rhythm of: NSR  Social Determinants of Health:  Diagnosis or treatment significantly limited by social determinants of health: no pcp   Reevaluation: After the interventions noted above, I reevaluated the patient and found that they have improved  Co morbidities that complicate the patient evaluation  Past Medical History:  Diagnosis Date   Acute blood loss as cause of postoperative anemia 04/18/2017   ARF (acute renal failure) (HCC)    Cellulitis of right lower leg 03/31/2017   Chronic anticoagulation 03/31/2017   Diabetes mellitus without complication (HCC)    Diabetic ulcer of calf (HCC) 03/31/2017   DVT of proximal lower limb (HCC) 03/20/2017   R calf area   Escherichia coli sepsis (HCC) 04/18/2017   Gout    History of hiatal hernia    Hypertension    Hypoalbuminemia 03/31/2017   Necrotizing fasciitis (HCC)    Right leg DVT (HCC) 03/31/2017   Sepsis (HCC) 03/30/2017   Sinus tachycardia 03/31/2017   Type 2 diabetes mellitus with stage 2 chronic kidney disease, with long-term current use of insulin (HCC) 03/27/2017      Dispostion: Disposition decision including need for hospitalization was considered, and patient disposition pending at time of sign  out.    Final Clinical Impression(s) / ED Diagnoses Final diagnoses:  Cellulitis of submandibular region        Sloan Leiter, DO 05/04/23 1653

## 2023-05-04 NOTE — ED Triage Notes (Signed)
Pt c/o bilateral jaw pain since Thursday afternoon. Pt stated he did have some teeth that were decayed. Pt also c/o SOB when he goes up and down steps since Thurs. Pt verbalized he is not coughing, or afebrile.

## 2023-05-04 NOTE — ED Notes (Signed)
Ambulated with pulse oximeter, reading stayed at 97% and went up to 98%, walked around nurses station and back to room. Pt tolerated well.

## 2023-05-04 NOTE — ED Notes (Signed)
Tolerating water, pt stated there is slight pain

## 2023-05-04 NOTE — ED Notes (Signed)
Called [facility] per Dr. Gwenette Greet.

## 2023-05-04 NOTE — ED Provider Notes (Signed)
Patient accepted in admission by hospitalist, Dr. Lovena Neighbours, He does not appear toxic or septic, he does have a cellulitis and in an area that could get worse so he will need to be admitted with IV antibiotics, Unasyn has already been given.   Eber Hong, MD 05/04/23 [phone]

## 2023-05-04 NOTE — Progress Notes (Signed)
ENT Progress Note  Received a call from ED physician Dr. Wallace Cullens with a question about patient care.  CT neck with contrast done today demonstrates odontogenic source infection with potential involvement of left submandibular and sublingual space concerning for Ludwig's angina as well as evidence of large periapical erosion of the left lower second molar tooth.  Please see summary of CT neck findings below.   Based on imaging and clinical presentation I recommend evaluation by oral surgery.  This consultation is not available at [facility].  I recommended to Dr. Wallace Cullens that the patient should receive broad-spectrum antibiotics and be transferred to a facility where oral surgery consultation is available for appropriate care and management.   CT neck with contrast from today FINDINGS: Osseous: Dental caries. The left lower second molar has a large periapical erosion with lingual cortical dehiscence. In the left submandibular space there is subtle low-density appearance without well-defined collection. Mild left sublingual space swelling is likely. Mild mass effect on the floor of mouth structures.   Orbits: Negative   Sinuses: Retention cyst in the lower maxillary sinuses.   Soft tissues: As above.   Limited intracranial: Negative   IMPRESSION: Odontogenic soft tissue infection in the left submandibular (and likely sublingual) space related to tooth number 18. No drainable collection. Mass effect on the floor of mouth is mild.  Ashok Croon

## 2023-05-05 DIAGNOSIS — K122 Cellulitis and abscess of mouth: Secondary | ICD-10-CM | POA: Diagnosis not present

## 2023-05-05 LAB — BASIC METABOLIC PANEL
Anion gap: 9 (ref 5–15)
BUN: 27 mg/dL — ABNORMAL HIGH (ref 6–20)
CO2: 22 mmol/L (ref 22–32)
Calcium: 8.6 mg/dL — ABNORMAL LOW (ref 8.9–10.3)
Chloride: 104 mmol/L (ref 98–111)
Creatinine, Ser: 1.86 mg/dL — ABNORMAL HIGH (ref 0.61–1.24)
GFR, Estimated: 43 mL/min — ABNORMAL LOW (ref >60)
Glucose, Bld: 98 mg/dL (ref 70–99)
Potassium: 3.5 mmol/L (ref 3.5–5.1)
Sodium: 135 mmol/L (ref 135–145)

## 2023-05-05 LAB — CBG MONITORING, ED
Glucose-Capillary: 102 mg/dL — ABNORMAL HIGH (ref 70–99)
Glucose-Capillary: 168 mg/dL — ABNORMAL HIGH (ref 70–99)

## 2023-05-05 LAB — GLUCOSE, CAPILLARY
Glucose-Capillary: 118 mg/dL — ABNORMAL HIGH (ref 70–99)
Glucose-Capillary: 235 mg/dL — ABNORMAL HIGH (ref 70–99)
Glucose-Capillary: 201 mg/dL — ABNORMAL HIGH (ref 70–99)

## 2023-05-05 LAB — CBC
HCT: 47.3 % (ref 39.0–52.0)
Hemoglobin: 15.1 g/dL (ref 13.0–17.0)
MCH: 29.4 pg (ref 26.0–34.0)
MCHC: 31.9 g/dL (ref 30.0–36.0)
MCV: 92 fL (ref 80.0–100.0)
Platelets: 224 10*3/uL (ref 150–400)
RBC: 5.14 MIL/uL (ref 4.22–5.81)
RDW: 13.5 % (ref 11.5–15.5)
WBC: 5.7 10*3/uL (ref 4.0–10.5)
nRBC: 0 % (ref 0.0–0.2)

## 2023-05-05 LAB — HEMOGLOBIN A1C
Hgb A1c MFr Bld: 5.5 % (ref 4.8–5.6)
Mean Plasma Glucose: 111.15 mg/dL

## 2023-05-05 LAB — HIV ANTIBODY (ROUTINE TESTING W REFLEX): HIV Screen 4th Generation wRfx: NONREACTIVE

## 2023-05-05 MED ORDER — INSULIN ASPART 100 UNIT/ML IJ SOLN
0.0000 [IU] | Freq: Three times a day (TID) | INTRAMUSCULAR | Status: DC
  Administered 2023-05-05: 3 [IU] via SUBCUTANEOUS
  Administered 2023-05-06 – 2023-05-07 (×3): 2 [IU] via SUBCUTANEOUS
  Filled 2023-05-05: qty 1

## 2023-05-05 MED ORDER — INSULIN ASPART 100 UNIT/ML IJ SOLN: 0.0000 [IU] | Freq: Three times a day (TID) | INTRAMUSCULAR | Status: DC

## 2023-05-05 MED ORDER — HYDROMORPHONE HCL 1 MG/ML IJ SOLN: 0.5000 mg | INTRAMUSCULAR | Status: DC | PRN

## 2023-05-05 MED ORDER — INSULIN ASPART 100 UNIT/ML IJ SOLN
0.0000 [IU] | Freq: Every day | INTRAMUSCULAR | Status: DC
  Administered 2023-05-05 – 2023-05-06 (×2): 2 [IU] via SUBCUTANEOUS

## 2023-05-05 MED ORDER — HYDRALAZINE HCL 50 MG PO TABS: 50.0000 mg | ORAL_TABLET | Freq: Three times a day (TID) | ORAL | Status: DC | PRN

## 2023-05-05 NOTE — TOC CM/SW Note (Signed)
Transition of Care [facility]) - Inpatient Brief Assessment   Patient Details  Name: [name] MRN: [id number] Date of Birth: 1971/02/21  Transition of Care [facility]) CM/SW Contact:    Villa Herb, LCSWA Phone Number: 05/05/2023, 11:08 AM   Clinical Narrative: Transition of Care Department [facility]) has reviewed patient and no TOC needs have been identified at this time. We will continue to monitor patient advancement through interdisciplinary progression rounds. If new patient transition needs arise, please place a TOC consult.  Transition of Care Asessment: Insurance and Status: Insurance coverage has been reviewed Patient has primary care physician: Yes Home environment has been reviewed: From home Prior level of function:: Independent Prior/Current Home Services: No current home services Social Determinants of Health Reivew: SDOH reviewed no interventions necessary Readmission risk has been reviewed: Yes Transition of care needs: no transition of care needs at this time

## 2023-05-05 NOTE — Plan of Care (Signed)
Patient was seen and examined at bedside.  Patient was transferred from [facility] Pain in the jaw bilaterally 2/10, patient is sitting comfortably.  Denied any other active issues.  Patient is concerned about his right lower extremity wound for which she is following wound care as an outpatient and has Unna boot replaced every weekly, Tuesdays.  Patient is following with wound care for past 6 years.  We will continue current management and consult ENT tomorrow a.m. and try to transfer to [facility] if needed.

## 2023-05-05 NOTE — ED Notes (Signed)
[facility] contacted at this time for transport. Misty Stanley

## 2023-05-05 NOTE — Progress Notes (Signed)
PROGRESS NOTE    [name]  ZOX:[id number] DOB: [birth year] DOA: 05/04/2023 PCP: Barbette Reichmann, MD   Brief Narrative:  [name] is a 52 y.o. male with medical history significant of T2DM, DVT, HTN, necrotizing fasciitis, CKD 3A and gout who presented to the ED with left-sided jaw pain and swelling. Patient reports he has not seen a dentist since 2006. Two weeks ago, he had mild swelling of his left jaw that resolved on its own. On Thursday, he noticed the swelling returned. By Friday, he noted the swelling had progressed and he had difficulty eating. He denies any neck pain, shortness of breath, fevers, chills but reports that he has some mild left jaw pain/dental pain with chewing and feels mild throat discomfort with oral intake.    Patient was admitted for odontogenic soft tissue infection of the left submandibular space and has been started on Unasyn.  ENT states that patient should transfer to tertiary care facility for further evaluation and workup.  [facility] and [facility] unable to accept patient.  Plan to call [facility].   Assessment & Plan:   Principal Problem:   Cellulitis of submandibular region  Assessment and Plan:   #Cellulitis of the submandibular region Patient with a history of diabetes and poor dentition presented with 2 days of left mandibular swelling and jaw pain. Patient found to have evidence of soft tissue infection in the left submandibular and possibly sublingual space related to erosion of tooth 18 but no evidence of abscess. Patient is currently protecting his airway and infection seems to be limited to the submandibular space however he is at high risk of progressing to Ludwig angina. ENT recommended transfer to facility with oral surgeon consultation however attempt to transfer patient to [facility] and [facility] were unsuccessful. Will admit patient to [facility] for for higher level of care.  -Patient ok to transfer to [facility] for now for safer observation -Continue IV  Unasyn -Low threshold to transfer to transfer to ICU -Close monitoring of respiratory status -Tylenol for mild pain, IV Dilaudid for moderate, severe pain -Trend CBC, fever curve -Trial of soft diet -Call ENT if needed and may talk to dentistry by 10/21 to see if this can be debrided outpatient   #T2DM Last A1c 6.2% 5 months ago. Blood sugar of 115 on CMP. -SSI with CBG monitoring -A1c 5.5%   #CKD 3A Started on diet today, continue to monitor and avoid use of IV fluid for now Creatinine elevated to 1.86 Labs in a.m.   Obesity BMI 36.68   DVT prophylaxis:Lovenox Code Status: Full Family Communication: None at bedside Disposition Plan:  Status is: Inpatient Remains inpatient appropriate because: Need for IV medications.  Consultants:  None  Procedures:  None  Antimicrobials:  Anti-infectives (From admission, onward)    Start     Dose/Rate Route Frequency Ordered Stop   05/04/23 2000  Ampicillin-Sulbactam (UNASYN) 3 g in sodium chloride 0.9 % 100 mL IVPB        3 g 200 mL/hr over 30 Minutes Intravenous Every 6 hours 05/04/23 1644     05/04/23 1330  Ampicillin-Sulbactam (UNASYN) 3 g in sodium chloride 0.9 % 100 mL IVPB        3 g 200 mL/hr over 30 Minutes Intravenous  Once 05/04/23 1316 05/04/23 1511       Subjective: Patient seen and evaluated today with no new acute complaints or concerns. No acute concerns or events noted overnight.  He denies any pain.  Objective: Vitals:  05/05/23 0415 05/05/23 0430 05/05/23 0445 05/05/23 0638  BP: 113/85 108/86 112/80   Pulse:   81   Resp:   18   Temp:    98.1 F (36.7 C)  TempSrc:      SpO2:   100%   Weight:      Height:        Intake/Output Summary (Last 24 hours) at 05/05/2023 1306 Last data filed at 05/04/2023 2123 Gross per 24 hour  Intake 100 ml  Output --  Net 100 ml   Filed Weights   05/04/23 0813  Weight: 119.3 kg    Examination:  General exam: Appears calm and comfortable  Respiratory  system: Clear to auscultation. Respiratory effort normal. Cardiovascular system: S1 & S2 heard, RRR.  Gastrointestinal system: Abdomen is soft Central nervous system: Alert and awake Extremities: No edema Skin: No significant lesions noted Psychiatry: Flat affect.    Data Reviewed: I have personally reviewed following labs and imaging studies  CBC: Recent Labs  Lab 05/04/23 0854 05/05/23 0538  WBC 6.8 5.7  NEUTROABS 5.0  --   HGB 15.8 15.1  HCT 48.4 47.3  MCV 90.0 92.0  PLT 248 224   Basic Metabolic Panel: Recent Labs  Lab 05/04/23 0854 05/05/23 0538  NA 135 135  K 3.7 3.5  CL 102 104  CO2 23 22  GLUCOSE 115* 98  BUN 18 27*  CREATININE 1.56* 1.86*  CALCIUM 9.0 8.6*   GFR: Estimated Creatinine Clearance: 61.7 mL/min (A) (by C-G formula based on SCr of 1.86 mg/dL (H)). Liver Function Tests: Recent Labs  Lab 05/04/23 0854  AST 14*  ALT 14  ALKPHOS 74  BILITOT 1.2  PROT 8.0  ALBUMIN 4.0   No results for input(s): "LIPASE", "AMYLASE" in the last 168 hours. No results for input(s): "AMMONIA" in the last 168 hours. Coagulation Profile: No results for input(s): "INR", "PROTIME" in the last 168 hours. Cardiac Enzymes: No results for input(s): "CKTOTAL", "CKMB", "CKMBINDEX", "TROPONINI" in the last 168 hours. BNP (last 3 results) No results for input(s): "PROBNP" in the last 8760 hours. HbA1C: Recent Labs    05/05/23 0538  HGBA1C 5.5   CBG: Recent Labs  Lab 05/05/23 1015  GLUCAP 102*   Lipid Profile: No results for input(s): "CHOL", "HDL", "LDLCALC", "TRIG", "CHOLHDL", "LDLDIRECT" in the last 72 hours. Thyroid Function Tests: No results for input(s): "TSH", "T4TOTAL", "FREET4", "T3FREE", "THYROIDAB" in the last 72 hours. Anemia Panel: No results for input(s): "VITAMINB12", "FOLATE", "FERRITIN", "TIBC", "IRON", "RETICCTPCT" in the last 72 hours. Sepsis Labs: No results for input(s): "PROCALCITON", "LATICACIDVEN" in the last 168 hours.  Recent  Results (from the past 240 hour(s))  Resp panel by RT-PCR (RSV, Flu A&B, Covid) Anterior Nasal Swab     Status: None   Collection Time: 05/04/23  9:00 AM   Specimen: Anterior Nasal Swab  Result Value Ref Range Status   SARS Coronavirus 2 by RT PCR NEGATIVE NEGATIVE Final    Comment: (NOTE) SARS-CoV-2 target nucleic acids are NOT DETECTED.  The SARS-CoV-2 RNA is generally detectable in upper respiratory specimens during the acute phase of infection. The lowest concentration of SARS-CoV-2 viral copies this assay can detect is 138 copies/mL. A negative result does not preclude SARS-Cov-2 infection and should not be used as the sole basis for treatment or other patient management decisions. A negative result may occur with  improper specimen collection/handling, submission of specimen other than nasopharyngeal swab, presence of viral mutation(s) within the areas targeted by  this assay, and inadequate number of viral copies(<138 copies/mL). A negative result must be combined with clinical observations, patient history, and epidemiological information. The expected result is Negative.  Fact Sheet for Patients:  BloggerCourse.com  Fact Sheet for Healthcare Providers:  SeriousBroker.it  This test is no t yet approved or cleared by the [place] FDA and  has been authorized for detection and/or diagnosis of SARS-CoV-2 by FDA under an Emergency Use Authorization (EUA). This EUA will remain  in effect (meaning this test can be used) for the duration of the COVID-19 declaration under Section 564(b)(1) of the Act, 21 U.S.C.section 360bbb-3(b)(1), unless the authorization is terminated  or revoked sooner.       Influenza A by PCR NEGATIVE NEGATIVE Final   Influenza B by PCR NEGATIVE NEGATIVE Final    Comment: (NOTE) The Xpert Xpress SARS-CoV-2/FLU/RSV plus assay is intended as an aid in the diagnosis of influenza from Nasopharyngeal  swab specimens and should not be used as a sole basis for treatment. Nasal washings and aspirates are unacceptable for Xpert Xpress SARS-CoV-2/FLU/RSV testing.  Fact Sheet for Patients: BloggerCourse.com  Fact Sheet for Healthcare Providers: SeriousBroker.it  This test is not yet approved or cleared by the [place] FDA and has been authorized for detection and/or diagnosis of SARS-CoV-2 by FDA under an Emergency Use Authorization (EUA). This EUA will remain in effect (meaning this test can be used) for the duration of the COVID-19 declaration under Section 564(b)(1) of the Act, 21 U.S.C. section 360bbb-3(b)(1), unless the authorization is terminated or revoked.     Resp Syncytial Virus by PCR NEGATIVE NEGATIVE Final    Comment: (NOTE) Fact Sheet for Patients: BloggerCourse.com  Fact Sheet for Healthcare Providers: SeriousBroker.it  This test is not yet approved or cleared by the [place] FDA and has been authorized for detection and/or diagnosis of SARS-CoV-2 by FDA under an Emergency Use Authorization (EUA). This EUA will remain in effect (meaning this test can be used) for the duration of the COVID-19 declaration under Section 564(b)(1) of the Act, 21 U.S.C. section 360bbb-3(b)(1), unless the authorization is terminated or revoked.  Performed at [facility], [address], [place], [place] [address]          Radiology Studies: CT Soft Tissue Neck W Contrast  Result Date: 05/04/2023 CLINICAL DATA:  Evaluate for mandibular abscess. EXAM: CT NECK WITH CONTRAST TECHNIQUE: Multidetector CT imaging of the neck was performed using the standard protocol following the bolus administration of intravenous contrast. RADIATION DOSE REDUCTION: This exam was performed according to the departmental dose-optimization program which includes automated exposure control,  adjustment of the mA and/or kV according to patient size and/or use of iterative reconstruction technique. CONTRAST:  75mL OMNIPAQUE IOHEXOL 300 MG/ML  SOLN COMPARISON:  None Available. FINDINGS: Pharynx and larynx: Left submandibular fat inflammation related to periapical erosion with lingual sided dehiscence at tooth 18. Mass effect on the floor of mouth is mild. Some inflammation is likely present in the left sublingual space. No abscess. Salivary glands: No acute finding Thyroid: Normal. Lymph nodes: None enlarged or abnormal density. Vascular: Negative. Limited intracranial: Negative. Visualized orbits: Negative. Mastoids and visualized paranasal sinuses: Clear. Skeleton: C5/6 and C6/7 prominent degenerative spurring crowding the cord Upper chest: Negative. IMPRESSION: Submandibular (and likely sublingual) cellulitis on the left related to dehiscent periapical erosion affecting tooth 18. no abscess. Electronically Signed   By: Tiburcio Pea M.D.   On: 05/04/2023 10:53   CT Maxillofacial W Contrast  Result Date: 05/04/2023  CLINICAL DATA:  Facial swelling. EXAM: CT MAXILLOFACIAL WITH CONTRAST TECHNIQUE: Multidetector CT imaging of the maxillofacial structures was performed with intravenous contrast. Multiplanar CT image reconstructions were also generated. RADIATION DOSE REDUCTION: This exam was performed according to the departmental dose-optimization program which includes automated exposure control, adjustment of the mA and/or kV according to patient size and/or use of iterative reconstruction technique. CONTRAST:  75mL OMNIPAQUE IOHEXOL 300 MG/ML  SOLN COMPARISON:  None Available. FINDINGS: Osseous: Dental caries. The left lower second molar has a large periapical erosion with lingual cortical dehiscence. In the left submandibular space there is subtle low-density appearance without well-defined collection. Mild left sublingual space swelling is likely. Mild mass effect on the floor of mouth  structures. Orbits: Negative Sinuses: Retention cyst in the lower maxillary sinuses. Soft tissues: As above. Limited intracranial: Negative IMPRESSION: Odontogenic soft tissue infection in the left submandibular (and likely sublingual) space related to tooth number 18. No drainable collection. Mass effect on the floor of mouth is mild. Electronically Signed   By: Tiburcio Pea M.D.   On: 05/04/2023 10:33   DG Chest 2 View  Result Date: 05/04/2023 CLINICAL DATA:  Shortness of breath. EXAM: CHEST - 2 VIEW COMPARISON:  12/25/2019. FINDINGS: Bilateral lung fields are clear. Elevated right hemidiaphragm again seen. Bilateral costophrenic angles are clear. Normal cardio-mediastinal silhouette. No acute osseous abnormalities. The soft tissues are within normal limits. IMPRESSION: 1. No active cardiopulmonary disease. Electronically Signed   By: Jules Schick M.D.   On: 05/04/2023 10:16        Scheduled Meds:  enoxaparin (LOVENOX) injection  40 mg Subcutaneous Q24H   insulin aspart  0-15 Units Subcutaneous TID WC   Continuous Infusions:  ampicillin-sulbactam (UNASYN) IV 3 g (05/05/23 1008)     LOS: 1 day    Time spent: 35 minutes    Mindy Gali Hoover Brunette, DO [facility]  If 7PM-7AM, please contact night-coverage [email or web address] 05/05/2023, 1:06 PM

## 2023-05-06 ENCOUNTER — Inpatient Hospital Stay (HOSPITAL_COMMUNITY): Payer: Medicaid Other

## 2023-05-06 ENCOUNTER — Encounter (HOSPITAL_COMMUNITY): Payer: Self-pay | Admitting: Student

## 2023-05-06 DIAGNOSIS — K122 Cellulitis and abscess of mouth: Secondary | ICD-10-CM | POA: Diagnosis not present

## 2023-05-06 DIAGNOSIS — N179 Acute kidney failure, unspecified: Secondary | ICD-10-CM | POA: Diagnosis not present

## 2023-05-06 LAB — CBC
HCT: 42.8 % (ref 39.0–52.0)
Hemoglobin: 13.8 g/dL (ref 13.0–17.0)
MCH: 29.1 pg (ref 26.0–34.0)
MCHC: 32.2 g/dL (ref 30.0–36.0)
MCV: 90.3 fL (ref 80.0–100.0)
Platelets: 227 10*3/uL (ref 150–400)
RBC: 4.74 MIL/uL (ref 4.22–5.81)
RDW: 13.6 % (ref 11.5–15.5)
WBC: 5 10*3/uL (ref 4.0–10.5)
nRBC: 0 % (ref 0.0–0.2)

## 2023-05-06 LAB — BASIC METABOLIC PANEL
Anion gap: 11 (ref 5–15)
BUN: 33 mg/dL — ABNORMAL HIGH (ref 6–20)
CO2: 22 mmol/L (ref 22–32)
Calcium: 8.8 mg/dL — ABNORMAL LOW (ref 8.9–10.3)
Chloride: 106 mmol/L (ref 98–111)
Creatinine, Ser: 1.96 mg/dL — ABNORMAL HIGH (ref 0.61–1.24)
GFR, Estimated: 41 mL/min — ABNORMAL LOW (ref >60)
Glucose, Bld: 112 mg/dL — ABNORMAL HIGH (ref 70–99)
Potassium: 3.7 mmol/L (ref 3.5–5.1)
Sodium: 139 mmol/L (ref 135–145)

## 2023-05-06 LAB — PHOSPHORUS: Phosphorus: 3.5 mg/dL (ref 2.5–4.6)

## 2023-05-06 LAB — GLUCOSE, CAPILLARY
Glucose-Capillary: 120 mg/dL — ABNORMAL HIGH (ref 70–99)
Glucose-Capillary: 134 mg/dL — ABNORMAL HIGH (ref 70–99)
Glucose-Capillary: 135 mg/dL — ABNORMAL HIGH (ref 70–99)
Glucose-Capillary: 87 mg/dL (ref 70–99)
Glucose-Capillary: 204 mg/dL — ABNORMAL HIGH (ref 70–99)

## 2023-05-06 LAB — MAGNESIUM: Magnesium: 2 mg/dL (ref 1.7–2.4)

## 2023-05-06 LAB — VITAMIN D 25 HYDROXY (VIT D DEFICIENCY, FRACTURES): Vit D, 25-Hydroxy: 37.27 ng/mL (ref 30–100)

## 2023-05-06 MED ORDER — ENOXAPARIN SODIUM 60 MG/0.6ML IJ SOSY
60.0000 mg | PREFILLED_SYRINGE | INTRAMUSCULAR | Status: DC
  Administered 2023-05-06: 60 mg via SUBCUTANEOUS
  Filled 2023-05-06: qty 0.6

## 2023-05-06 NOTE — Progress Notes (Signed)
[facility] Progress Note  Patient: [name]    NWG:[id number]  DOA: 05/04/2023     Date of Service: the patient was seen and examined on 05/06/2023  Chief Complaint  Patient presents with   Shortness of Breath   Jaw Pain   Brief hospital course: [name] is a 52 y.o. male with medical history significant of T2DM, DVT, HTN, necrotizing fasciitis, CKD 3A and gout who presented to the ED with left-sided jaw pain and swelling. Patient reports he has not seen a dentist since 2006. Two weeks ago, he had mild swelling of his left jaw that resolved on its own. On Thursday, he noticed the swelling returned. By Friday, he noted the swelling had progressed and he had difficulty eating. He denies any neck pain, shortness of breath, fevers, chills but reports that he has some mild left jaw pain/dental pain with chewing and feels mild throat discomfort with oral intake.     Patient was admitted for odontogenic soft tissue infection of the left submandibular space and has been started on Unasyn.  ENT states that patient should transfer to tertiary care facility for further evaluation and workup.  [facility] and [facility] unable to accept patient.  Plan to call [facility].     Assessment and Plan:  #Cellulitis of the submandibular region Patient with a history of diabetes and poor dentition presented with 2 days of left mandibular swelling and jaw pain. Patient found to have evidence of soft tissue infection in the left submandibular and possibly sublingual space related to erosion of tooth 18 but no evidence of abscess. Patient is currently protecting his airway and infection seems to be limited to the submandibular space however he is at high risk of progressing to Ludwig angina. ENT recommended transfer to facility with oral surgeon consultation however attempt to transfer patient to [facility] and [facility] were unsuccessful. Will admit patient to [facility] for for higher level of care.  -Patient ok to transfer  to [facility] for now for safer observation -Continue IV Unasyn -Low threshold to transfer to transfer to ICU -Close monitoring of respiratory status -Tylenol for mild pain, IV Dilaudid for moderate, severe pain -Trend CBC, fever curve -Trial of soft diet -Call ENT if needed and may talk to dentistry by 10/21 to see if this can be debrided outpatient 10/21 patient's pain is resolved, seems to be improving with antibiotics.  Recommend to follow with the dentist as an outpatient, no need of ENT at this time and no need to transfer to a different facility.  #T2DM Last A1c 6.2% 5 months ago. Blood sugar of 115 on CMP. -SSI with CBG monitoring -A1c 5.5%   #CKD 3A Started on diet today, continue to monitor and avoid use of IV fluid for now Creatinine elevated to 1.86--1.96 US renal: Unremarkable renal ultrasound.  Bladder decompressed and not well assessed. Labs in a.m.   Obesity BMI 36.68   Hypocalcemia, vitamin D level within normal range.  Follow with PCP.   Body mass index is 36.68 kg/m.  Interventions:  Diet: Carb modified diet/soft consistency DVT Prophylaxis: Subcutaneous Lovenox   Advance goals of care discussion: Full code  Family Communication: family was not present at bedside, at the time of interview.  The pt provided permission to discuss medical plan with the family. Opportunity was given to ask question and all questions were answered satisfactorily.   Disposition:  Pt is from home, admitted with cellulitis of mandibular region due to tooth infection, still on IV  antibiotics, and elevated creatinine, which precludes a safe discharge. Discharge to home, when stable, most likely tomorrow a.m.  Subjective: Patient was seen and examined at bedside.  Jaw pain is almost resolved, denied any new complaints.   Physical Exam: General: NAD, lying comfortably Appear in no distress, affect appropriate Eyes: PERRLA ENT: Oral Mucosa Clear, moist  Neck: no JVD,   Cardiovascular: S1 and S2 Present, no Murmur,  Respiratory: good respiratory effort, Bilateral Air entry equal and Decreased, no Crackles, no wheezes Abdomen: Bowel Sound present, Soft and no tenderness,  Skin: no rashes Extremities: no Pedal edema, no calf tenderness Neurologic: without any new focal findings Gait not checked due to patient safety concerns  Vitals:   05/05/23 2012 05/06/23 0113 05/06/23 0402 05/06/23 0744  BP: 128/79 126/73 134/81 (!) 138/92  Pulse: (!) 102 80 74 78  Resp: 18 18 18    Temp: 98 F (36.7 C) 98.9 F (37.2 C) 98.6 F (37 C) 98.3 F (36.8 C)  TempSrc: Oral Oral  Oral  SpO2: 98% 100% 100% 97%  Weight:      Height:        Intake/Output Summary (Last 24 hours) at 05/06/2023 1519 Last data filed at 05/06/2023 1049 Gross per 24 hour  Intake 799.71 ml  Output --  Net 799.71 ml   Filed Weights   05/04/23 0813  Weight: 119.3 kg    Data Reviewed: I have personally reviewed and interpreted daily labs, tele strips, imagings as discussed above. I reviewed all nursing notes, pharmacy notes, vitals, pertinent old records I have discussed plan of care as described above with RN and patient/family.  CBC: Recent Labs  Lab 05/04/23 0854 05/05/23 0538 05/06/23 0628  WBC 6.8 5.7 5.0  NEUTROABS 5.0  --   --   HGB 15.8 15.1 13.8  HCT 48.4 47.3 42.8  MCV 90.0 92.0 90.3  PLT 248 224 227   Basic Metabolic Panel: Recent Labs  Lab 05/04/23 0854 05/05/23 0538 05/06/23 0628  NA 135 135 139  K 3.7 3.5 3.7  CL 102 104 106  CO2 23 22 22   GLUCOSE 115* 98 112*  BUN 18 27* 33*  CREATININE 1.56* 1.86* 1.96*  CALCIUM 9.0 8.6* 8.8*  MG  --   --  2.0  PHOS  --   --  3.5    Studies: US RENAL  Result Date: 05/06/2023 CLINICAL DATA:  AKI EXAM: RENAL / URINARY TRACT ULTRASOUND COMPLETE COMPARISON:  CT abdomen/pelvis 01/23/2018 FINDINGS: Right Kidney: Renal measurements: 11.8 cm x 6.1 cm x 4.4 cm = volume: 166 mL. Echogenicity within normal limits. No  mass or hydronephrosis visualized. Left Kidney: Renal measurements: 10.8 cm x 5.7 cm x 4.5 cm = volume: 147 mL. Echogenicity within normal limits. No mass or hydronephrosis visualized. Bladder: Decompressed and not well assessed. Other: None. IMPRESSION: Unremarkable renal ultrasound. Electronically Signed   By: Lesia Hausen M.D.   On: 05/06/2023 14:47    Scheduled Meds:  enoxaparin (LOVENOX) injection  60 mg Subcutaneous Q24H   insulin aspart  0-15 Units Subcutaneous TID WC   insulin aspart  0-5 Units Subcutaneous QHS   Continuous Infusions:  ampicillin-sulbactam (UNASYN) IV 3 g (05/06/23 1428)   PRN Meds: acetaminophen **OR** acetaminophen, albuterol, hydrALAZINE, HYDROmorphone (DILAUDID) injection, ondansetron **OR** ondansetron (ZOFRAN) IV, senna-docusate  Time spent: 35 minutes  Author: Gillis Santa. MD [facility] 05/06/2023 3:19 PM  To reach On-call, see care teams to locate the attending and reach out to them via [email or web address]. If  7PM-7AM, please contact night-coverage If you still have difficulty reaching the attending provider, please page the [facility] (Director on Call) for [facility] on amion for assistance.

## 2023-05-06 NOTE — Plan of Care (Signed)

## 2023-05-06 NOTE — Consult Note (Signed)
WOC Nurse Consult Note: patient is followed by wound care center for venous ulcer R lateral lower leg, wears Urgo K2 lite compression wraps placed weekly by [facility] last seen there  Reason for Consult: venous ulcer ? Unna boot  Wound type: full thickness r/t lymphedema R lateral lower leg  Pressure Injury POA: NA  Measurement:7 cm x 2 cm x 0.6 cm  Wound bed: 90% red moist 10% white tan fibrinous tissue  Drainage (amount, consistency, odor) moderate tan exudate  Periwound:  evidence of scarring, hemosiderin staining  Dressing procedure/placement/frequency: Clean R lateral lower leg wound with NS, place silver hydrofiber in wound bed.  WOC team placed Profore dressing 05/06/2023.  Change Profore weekly next due 05/13/2023 if remains inpatient.    Patient has a weekly appointment with [facility] Center and knows to follow-up with them as outpatient.   POC discussed with patient, bedside nurse and primary MD.   Thank you,    Priscella Mann MSN, RN-BC, CWOCN [phone]

## 2023-05-06 NOTE — Progress Notes (Signed)
Post void bladder scan volume was 0.  [name]

## 2023-05-06 NOTE — Progress Notes (Signed)
Pt complaining of tightness and poor mobility in right arm with no current signs of swelling. Hospitalist on call T. Opyd made aware of pts change in condition.

## 2023-05-07 ENCOUNTER — Ambulatory Visit (HOSPITAL_BASED_OUTPATIENT_CLINIC_OR_DEPARTMENT_OTHER): Payer: Medicaid Other | Admitting: Internal Medicine

## 2023-05-07 DIAGNOSIS — K122 Cellulitis and abscess of mouth: Secondary | ICD-10-CM | POA: Diagnosis not present

## 2023-05-07 LAB — BASIC METABOLIC PANEL
Anion gap: 11 (ref 5–15)
BUN: 26 mg/dL — ABNORMAL HIGH (ref 6–20)
CO2: 24 mmol/L (ref 22–32)
Calcium: 8.7 mg/dL — ABNORMAL LOW (ref 8.9–10.3)
Chloride: 104 mmol/L (ref 98–111)
Creatinine, Ser: 1.65 mg/dL — ABNORMAL HIGH (ref 0.61–1.24)
GFR, Estimated: 50 mL/min — ABNORMAL LOW (ref >60)
Glucose, Bld: 119 mg/dL — ABNORMAL HIGH (ref 70–99)
Potassium: 3.5 mmol/L (ref 3.5–5.1)
Sodium: 139 mmol/L (ref 135–145)

## 2023-05-07 LAB — CBC
HCT: 40.3 % (ref 39.0–52.0)
Hemoglobin: 13 g/dL (ref 13.0–17.0)
MCH: 28.8 pg (ref 26.0–34.0)
MCHC: 32.3 g/dL (ref 30.0–36.0)
MCV: 89.2 fL (ref 80.0–100.0)
Platelets: 219 10*3/uL (ref 150–400)
RBC: 4.52 MIL/uL (ref 4.22–5.81)
RDW: 13.4 % (ref 11.5–15.5)
WBC: 4.8 10*3/uL (ref 4.0–10.5)
nRBC: 0 % (ref 0.0–0.2)

## 2023-05-07 LAB — GLUCOSE, CAPILLARY: Glucose-Capillary: 121 mg/dL — ABNORMAL HIGH (ref 70–99)

## 2023-05-07 LAB — PHOSPHORUS: Phosphorus: 3.2 mg/dL (ref 2.5–4.6)

## 2023-05-07 LAB — MAGNESIUM: Magnesium: 2 mg/dL (ref 1.7–2.4)

## 2023-05-07 MED ORDER — AMOXICILLIN-POT CLAVULANATE 875-125 MG PO TABS: 1.0000 | ORAL_TABLET | Freq: Two times a day (BID) | ORAL | 0 refills | Status: AC

## 2023-05-07 MED ORDER — HYDRALAZINE HCL 50 MG PO TABS: 50.0000 mg | ORAL_TABLET | Freq: Three times a day (TID) | ORAL | 1 refills | Status: AC | PRN

## 2023-05-07 MED ORDER — CHLORHEXIDINE GLUCONATE 0.12 % MT SOLN: 15.0000 mL | Freq: Two times a day (BID) | OROMUCOSAL | 0 refills | Status: AC

## 2023-05-07 NOTE — Plan of Care (Signed)

## 2023-05-07 NOTE — Plan of Care (Signed)

## 2023-05-07 NOTE — Plan of Care (Signed)
Problem: Education: Goal: Ability to describe self-care measures that may prevent or decrease complications (Diabetes Survival Skills Education) will improve 05/07/2023 1102 by Doyce Para, RN Outcome: Adequate for Discharge 05/07/2023 1102 by Doyce Para, RN Outcome: Adequate for Discharge Goal: Individualized Educational Video(s) 05/07/2023 1102 by Doyce Para, RN Outcome: Adequate for Discharge 05/07/2023 1102 by Doyce Para, RN Outcome: Adequate for Discharge   Problem: Coping: Goal: Ability to adjust to condition or change in health will improve 05/07/2023 1102 by Doyce Para, RN Outcome: Adequate for Discharge 05/07/2023 1102 by Doyce Para, RN Outcome: Adequate for Discharge   Problem: Fluid Volume: Goal: Ability to maintain a balanced intake and output will improve 05/07/2023 1102 by Doyce Para, RN Outcome: Adequate for Discharge 05/07/2023 1102 by Doyce Para, RN Outcome: Adequate for Discharge   Problem: Health Behavior/Discharge Planning: Goal: Ability to identify and utilize available resources and services will improve 05/07/2023 1102 by Doyce Para, RN Outcome: Adequate for Discharge 05/07/2023 1102 by Doyce Para, RN Outcome: Adequate for Discharge Goal: Ability to manage health-related needs will improve 05/07/2023 1102 by Doyce Para, RN Outcome: Adequate for Discharge 05/07/2023 1102 by Doyce Para, RN Outcome: Adequate for Discharge   Problem: Metabolic: Goal: Ability to maintain appropriate glucose levels will improve 05/07/2023 1102 by Doyce Para, RN Outcome: Adequate for Discharge 05/07/2023 1102 by Doyce Para, RN Outcome: Adequate for Discharge   Problem: Nutritional: Goal: Maintenance of adequate nutrition will improve 05/07/2023 1102 by Doyce Para, RN Outcome: Adequate for Discharge 05/07/2023 1102 by Doyce Para, RN Outcome: Adequate for Discharge Goal: Progress toward achieving an optimal weight will improve 05/07/2023 1102 by Doyce Para, RN Outcome: Adequate for Discharge 05/07/2023 1102 by Doyce Para, RN Outcome: Adequate for Discharge   Problem: Skin Integrity: Goal: Risk for impaired skin integrity will decrease 05/07/2023 1102 by Doyce Para, RN Outcome: Adequate for Discharge 05/07/2023 1102 by Doyce Para, RN Outcome: Adequate for Discharge   Problem: Tissue Perfusion: Goal: Adequacy of tissue perfusion will improve 05/07/2023 1102 by Doyce Para, RN Outcome: Adequate for Discharge 05/07/2023 1102 by Doyce Para, RN Outcome: Adequate for Discharge   Problem: Education: Goal: Knowledge of General Education information will improve Description: Including pain rating scale, medication(s)/side effects and non-pharmacologic comfort measures 05/07/2023 1102 by Doyce Para, RN Outcome: Adequate for Discharge 05/07/2023 1102 by Doyce Para, RN Outcome: Adequate for Discharge   Problem: Health Behavior/Discharge Planning: Goal: Ability to manage health-related needs will improve 05/07/2023 1102 by Doyce Para, RN Outcome: Adequate for Discharge 05/07/2023 1102 by Doyce Para, RN Outcome: Adequate for Discharge   Problem: Clinical Measurements: Goal: Ability to maintain clinical measurements within normal limits will improve 05/07/2023 1102 by Doyce Para, RN Outcome: Adequate for Discharge 05/07/2023 1102 by Doyce Para, RN Outcome: Adequate for Discharge Goal: Will remain free from infection 05/07/2023 1102 by Doyce Para, RN Outcome: Adequate for Discharge 05/07/2023 1102 by Doyce Para, RN Outcome: Adequate for Discharge Goal: Diagnostic test results will improve 05/07/2023 1102 by Doyce Para, RN Outcome: Adequate for Discharge 05/07/2023  1102 by Doyce Para, RN Outcome: Adequate for Discharge Goal: Respiratory complications will improve 05/07/2023 1102 by Doyce Para, RN Outcome: Adequate for Discharge 05/07/2023 1102 by Doyce Para, RN Outcome: Adequate for Discharge Goal: Cardiovascular complication will be avoided 05/07/2023 1102 by Doyce Para, RN Outcome: Adequate for Discharge 05/07/2023 1102 by Doyce Para, RN Outcome: Adequate for Discharge   Problem: Activity: Goal: Risk for activity intolerance will decrease 05/07/2023 1102 by Doyce Para, RN Outcome: Adequate for Discharge 05/07/2023  1102 by Doyce Para, RN Outcome: Adequate for Discharge   Problem: Nutrition: Goal: Adequate nutrition will be maintained 05/07/2023 1102 by Doyce Para, RN Outcome: Adequate for Discharge 05/07/2023 1102 by Doyce Para, RN Outcome: Adequate for Discharge   Problem: Coping: Goal: Level of anxiety will decrease 05/07/2023 1102 by Doyce Para, RN Outcome: Adequate for Discharge 05/07/2023 1102 by Doyce Para, RN Outcome: Adequate for Discharge   Problem: Elimination: Goal: Will not experience complications related to bowel motility 05/07/2023 1102 by Doyce Para, RN Outcome: Adequate for Discharge 05/07/2023 1102 by Doyce Para, RN Outcome: Adequate for Discharge Goal: Will not experience complications related to urinary retention 05/07/2023 1102 by Doyce Para, RN Outcome: Adequate for Discharge 05/07/2023 1102 by Doyce Para, RN Outcome: Adequate for Discharge   Problem: Pain Managment: Goal: General experience of comfort will improve 05/07/2023 1102 by Doyce Para, RN Outcome: Adequate for Discharge 05/07/2023 1102 by Doyce Para, RN Outcome: Adequate for Discharge   Problem: Safety: Goal: Ability to remain free from injury will improve 05/07/2023 1102 by Doyce Para, RN Outcome: Adequate for Discharge 05/07/2023 1102 by Doyce Para, RN Outcome: Adequate for Discharge   Problem: Skin Integrity: Goal: Risk for impaired skin integrity will decrease 05/07/2023 1102 by Doyce Para, RN Outcome: Adequate for Discharge 05/07/2023 1102 by Doyce Para, RN Outcome: Adequate for Discharge

## 2023-05-07 NOTE — TOC Transition Note (Signed)
Transition of Care [facility]) - CM/SW Discharge Note   Patient Details  Name: [name] MRN: [id number] Date of Birth: 11-24-1970  [facility] CM/SW Contact:  Janae Bridgeman, RN Phone Number: 05/07/2023, 11:45 AM   Clinical Narrative:    CM provided the charge RN with [facility] list for patient to follow up and schedule OUtpatient appointment.  No other TOC needs at this time.     Barriers to Discharge: Continued Medical Work up   Patient Goals and CMS Choice      Discharge Placement                         Discharge Plan and Services Additional resources added to the After Visit Summary for                                       Social Determinants of Health (SDOH) Interventions SDOH Screenings   Food Insecurity: No Food Insecurity (05/06/2023)  Housing: Low Risk  (05/06/2023)  Transportation Needs: No Transportation Needs (05/06/2023)  Utilities: Not At Risk (05/06/2023)  Depression (PHQ2-9): Low Risk  (02/05/2019)  Financial Resource Strain: Low Risk  (03/14/2023)   Received from [facility]  Social Connections: Unknown (11/24/2021)   Received from [facility]  Tobacco Use: Low Risk  (05/06/2023)     Readmission Risk Interventions     No data to display

## 2023-05-07 NOTE — Discharge Summary (Signed)
[facility] Discharge Summary   Patient: [name] [id number]  PCP: Barbette Reichmann, MD  Date of admission: 05/04/2023   Date of discharge: 05/07/2023     Discharge Diagnoses:  Principal Problem:   Cellulitis of submandibular region Active Problems:   Diabetes mellitus (HCC)   Admitted From: Home Disposition:  Home   Recommendations for Outpatient Follow-up:  F/u with PCP in 1 wk, repeat CBC and BMP in 1 wk. Monitor BP at home and f/u with PCP to titrate meds  F/u with Dentist in 1 wk Follow up LABS/TEST:  CB and BMP in 1 wk   Diet recommendation: Carb modified and heart healthy diet   Activity: The patient is advised to gradually reintroduce usual activities, as tolerated  Discharge Condition: stable  Code Status: Full code   History of present illness: As per the H and P dictated on admission Hospital Course:  [name] is a 52 y.o. male with medical history significant of T2DM, DVT, HTN, necrotizing fasciitis, CKD 3A and gout who presented to the ED with left-sided jaw pain and swelling. Patient reports he has not seen a dentist since 2006. Two weeks ago, he had mild swelling of his left jaw that resolved on its own. On Thursday, he noticed the swelling returned. By Friday, he noted the swelling had progressed and he had difficulty eating. He denies any neck pain, shortness of breath, fevers, chills but reports that he has some mild left jaw pain/dental pain with chewing and feels mild throat discomfort with oral intake.     Patient was admitted for odontogenic soft tissue infection of the left submandibular space and has been started on Unasyn.  ENT states that patient should transfer to tertiary care facility for further evaluation and workup.  [facility] and [facility] unable to accept patient.  Plan to call [facility].   Assessment and Plan:  # Cellulitis of the submandibular region Patient with a history of diabetes and poor dentition presented with 2 days  of left mandibular swelling and jaw pain. Patient found to have evidence of soft tissue infection in the left submandibular and possibly sublingual space related to erosion of tooth 18 but no evidence of abscess. Patient is currently protecting his airway and infection seems to be limited to the submandibular space however he is at high risk of progressing to Ludwig angina. ENT recommended transfer to facility with oral surgeon consultation however attempt to transfer patient to [facility] and [facility] were unsuccessful. Will admit patient to [facility] for for higher level of care. Patient ok to transfer to [facility] for now for safer observation. S/p IV Unasyn, Tylenol for mild pain, IV Dilaudid for moderate, severe pain.  10/21 patient's pain is resolved, seems to be improving with antibiotics.   10/22 patient denies any pain in the bilateral mandibular area, able to tolerate diet well.  Transition to Augmentin twice daily for 5 days.  Recommended to follow with PCP and dentist in 1 week.   # T2DM, Last A1c 6.2% 5 months ago.  HbA1c 5.5, resumed home dose insulin and patient was advised to continue diabetic diet, monitor CBG at home and follow with PCP for further management. # CKD 3A, s/p IV fluid given for hydration due to elevated creatinine Creatinine elevated to 1.86--1.96, improved creatinine 1.65 today US renal: Unremarkable renal ultrasound.  Bladder decompressed and not well assessed. Follow with PCP and nephrology as an outpatient and repeat BMP after 1 week  # Hypertension, patient blood pressure remained  stable without antihypertensive medications.  Resumed lisinopril and hydrochlorothiazide on discharge, patient was advised to monitor BP at home and follow with PCP to titrate medication accordingly.  Discontinued amlodipine and metoprolol due to low blood pressure.  Use hydralazine 50 mg p.o. 3 times daily as needed if systolic BP greater than 150 mmHg.   # Hypocalcemia, Mild, vitamin D level within normal  range.  Follow w/PCP.  # Obesity Body mass index is 36.68 kg/m.  Nutrition Interventions: Calorie restricted diet and daily exercise advised to lose body weight.  Lifestyle modification discussed.   Patient was ambulatory without any assistance. On the day of the discharge the patient's vitals were stable, and no other acute medical condition were reported by patient. the patient was felt safe to be discharge at Home.  Consultants: ENT Procedures: NOne  Discharge Exam: General: Appear in no distress, no Rash; Oral Mucosa Clear, moist. Cardiovascular: S1 and S2 Present, no Murmur, Respiratory: normal respiratory effort, Bilateral Air entry present and no Crackles, no wheezes Abdomen: Bowel Sound present, Soft and no tenderness, no hernia Extremities: no Pedal edema, no calf tenderness Neurology: alert and oriented to time, place, and person affect appropriate.  Filed Weights   05/04/23 0813  Weight: 119.3 kg   Vitals:   05/07/23 0537 05/07/23 0827  BP: (!) 153/100 128/89  Pulse: 66 72  Resp: 17   Temp: 98.6 F (37 C) 98 F (36.7 C)  SpO2: 98% 100%    DISCHARGE MEDICATION: Allergies as of 05/07/2023   No Known Allergies      Medication List     STOP taking these medications    amLODipine 10 MG tablet Commonly known as: NORVASC   ibuprofen 800 MG tablet Commonly known as: ADVIL   metoprolol tartrate 100 MG tablet Commonly known as: LOPRESSOR       TAKE these medications    acetaminophen 325 MG tablet Commonly known as: Tylenol Take 2 tablets (650 mg total) by mouth every 6 (six) hours as needed for up to 30 doses.   amoxicillin-clavulanate 875-125 MG tablet Commonly known as: AUGMENTIN Take 1 tablet by mouth 2 (two) times daily for 5 days.   chlorhexidine 0.12 % solution Commonly known as: PERIDEX Use as directed 15 mLs in the mouth or throat 2 (two) times daily.   hydrALAZINE 50 MG tablet Commonly known as: APRESOLINE Take 1 tablet (50 mg  total) by mouth 3 (three) times daily as needed (if systolic BP greater than 150 mmHg). What changed:  medication strength how much to take when to take this reasons to take this   hydrochlorothiazide 25 MG tablet Commonly known as: HYDRODIURIL Take 25 mg by mouth every morning.   insulin aspart 100 UNIT/ML FlexPen Commonly known as: NOVOLOG Inject 10-15 Units into the skin 4 (four) times daily. Sliding scale   Lantus SoloStar 100 UNIT/ML Solostar Pen Generic drug: insulin glargine Inject 33 Units into the skin at bedtime.   lisinopril 40 MG tablet Commonly known as: ZESTRIL Take 40 mg by mouth every morning.   topiramate 25 MG tablet Commonly known as: TOPAMAX Take 1 tablet by mouth 2 (two) times daily.       No Known Allergies Discharge Instructions     Call MD for:  difficulty breathing, headache or visual disturbances   Complete by: As directed    Call MD for:  extreme fatigue   Complete by: As directed    Call MD for:  persistant dizziness or light-headedness  Complete by: As directed    Call MD for:  severe uncontrolled pain   Complete by: As directed    Call MD for:  temperature >100.4   Complete by: As directed    Diet - low sodium heart healthy   Complete by: As directed    Discharge instructions   Complete by: As directed    F/u with PCP in 1 wk, repeat CBC and BMP in 1 wk. Monitor BP at home and f/u with PCP to titrate meds  F/u with Dentist in 1 wk   Increase activity slowly   Complete by: As directed    No wound care   Complete by: As directed        The results of significant diagnostics from this hospitalization (including imaging, microbiology, ancillary and laboratory) are listed below for reference.    Significant Diagnostic Studies: US RENAL  Result Date: 05/06/2023 CLINICAL DATA:  AKI EXAM: RENAL / URINARY TRACT ULTRASOUND COMPLETE COMPARISON:  CT abdomen/pelvis 01/23/2018 FINDINGS: Right Kidney: Renal measurements: 11.8 cm x 6.1 cm  x 4.4 cm = volume: 166 mL. Echogenicity within normal limits. No mass or hydronephrosis visualized. Left Kidney: Renal measurements: 10.8 cm x 5.7 cm x 4.5 cm = volume: 147 mL. Echogenicity within normal limits. No mass or hydronephrosis visualized. Bladder: Decompressed and not well assessed. Other: None. IMPRESSION: Unremarkable renal ultrasound. Electronically Signed   By: Lesia Hausen M.D.   On: 05/06/2023 14:47   CT Soft Tissue Neck W Contrast  Result Date: 05/04/2023 CLINICAL DATA:  Evaluate for mandibular abscess. EXAM: CT NECK WITH CONTRAST TECHNIQUE: Multidetector CT imaging of the neck was performed using the standard protocol following the bolus administration of intravenous contrast. RADIATION DOSE REDUCTION: This exam was performed according to the departmental dose-optimization program which includes automated exposure control, adjustment of the mA and/or kV according to patient size and/or use of iterative reconstruction technique. CONTRAST:  75mL OMNIPAQUE IOHEXOL 300 MG/ML  SOLN COMPARISON:  None Available. FINDINGS: Pharynx and larynx: Left submandibular fat inflammation related to periapical erosion with lingual sided dehiscence at tooth 18. Mass effect on the floor of mouth is mild. Some inflammation is likely present in the left sublingual space. No abscess. Salivary glands: No acute finding Thyroid: Normal. Lymph nodes: None enlarged or abnormal density. Vascular: Negative. Limited intracranial: Negative. Visualized orbits: Negative. Mastoids and visualized paranasal sinuses: Clear. Skeleton: C5/6 and C6/7 prominent degenerative spurring crowding the cord Upper chest: Negative. IMPRESSION: Submandibular (and likely sublingual) cellulitis on the left related to dehiscent periapical erosion affecting tooth 18. no abscess. Electronically Signed   By: Tiburcio Pea M.D.   On: 05/04/2023 10:53   CT Maxillofacial W Contrast  Result Date: 05/04/2023 CLINICAL DATA:  Facial swelling. EXAM:  CT MAXILLOFACIAL WITH CONTRAST TECHNIQUE: Multidetector CT imaging of the maxillofacial structures was performed with intravenous contrast. Multiplanar CT image reconstructions were also generated. RADIATION DOSE REDUCTION: This exam was performed according to the departmental dose-optimization program which includes automated exposure control, adjustment of the mA and/or kV according to patient size and/or use of iterative reconstruction technique. CONTRAST:  75mL OMNIPAQUE IOHEXOL 300 MG/ML  SOLN COMPARISON:  None Available. FINDINGS: Osseous: Dental caries. The left lower second molar has a large periapical erosion with lingual cortical dehiscence. In the left submandibular space there is subtle low-density appearance without well-defined collection. Mild left sublingual space swelling is likely. Mild mass effect on the floor of mouth structures. Orbits: Negative Sinuses: Retention cyst in the lower maxillary sinuses.  Soft tissues: As above. Limited intracranial: Negative IMPRESSION: Odontogenic soft tissue infection in the left submandibular (and likely sublingual) space related to tooth number 18. No drainable collection. Mass effect on the floor of mouth is mild. Electronically Signed   By: Tiburcio Pea M.D.   On: 05/04/2023 10:33   DG Chest 2 View  Result Date: 05/04/2023 CLINICAL DATA:  Shortness of breath. EXAM: CHEST - 2 VIEW COMPARISON:  12/25/2019. FINDINGS: Bilateral lung fields are clear. Elevated right hemidiaphragm again seen. Bilateral costophrenic angles are clear. Normal cardio-mediastinal silhouette. No acute osseous abnormalities. The soft tissues are within normal limits. IMPRESSION: 1. No active cardiopulmonary disease. Electronically Signed   By: Jules Schick M.D.   On: 05/04/2023 10:16    Microbiology: Recent Results (from the past 240 hour(s))  Resp panel by RT-PCR (RSV, Flu A&B, Covid) Anterior Nasal Swab     Status: None   Collection Time: 05/04/23  9:00 AM   Specimen:  Anterior Nasal Swab  Result Value Ref Range Status   SARS Coronavirus 2 by RT PCR NEGATIVE NEGATIVE Final    Comment: (NOTE) SARS-CoV-2 target nucleic acids are NOT DETECTED.  The SARS-CoV-2 RNA is generally detectable in upper respiratory specimens during the acute phase of infection. The lowest concentration of SARS-CoV-2 viral copies this assay can detect is 138 copies/mL. A negative result does not preclude SARS-Cov-2 infection and should not be used as the sole basis for treatment or other patient management decisions. A negative result may occur with  improper specimen collection/handling, submission of specimen other than nasopharyngeal swab, presence of viral mutation(s) within the areas targeted by this assay, and inadequate number of viral copies(<138 copies/mL). A negative result must be combined with clinical observations, patient history, and epidemiological information. The expected result is Negative.  Fact Sheet for Patients:  BloggerCourse.com  Fact Sheet for Healthcare Providers:  SeriousBroker.it  This test is no t yet approved or cleared by the [place] FDA and  has been authorized for detection and/or diagnosis of SARS-CoV-2 by FDA under an Emergency Use Authorization (EUA). This EUA will remain  in effect (meaning this test can be used) for the duration of the COVID-19 declaration under Section 564(b)(1) of the Act, 21 U.S.C.section 360bbb-3(b)(1), unless the authorization is terminated  or revoked sooner.       Influenza A by PCR NEGATIVE NEGATIVE Final   Influenza B by PCR NEGATIVE NEGATIVE Final    Comment: (NOTE) The Xpert Xpress SARS-CoV-2/FLU/RSV plus assay is intended as an aid in the diagnosis of influenza from Nasopharyngeal swab specimens and should not be used as a sole basis for treatment. Nasal washings and aspirates are unacceptable for Xpert Xpress SARS-CoV-2/FLU/RSV testing.  Fact  Sheet for Patients: BloggerCourse.com  Fact Sheet for Healthcare Providers: SeriousBroker.it  This test is not yet approved or cleared by the [place] FDA and has been authorized for detection and/or diagnosis of SARS-CoV-2 by FDA under an Emergency Use Authorization (EUA). This EUA will remain in effect (meaning this test can be used) for the duration of the COVID-19 declaration under Section 564(b)(1) of the Act, 21 U.S.C. section 360bbb-3(b)(1), unless the authorization is terminated or revoked.     Resp Syncytial Virus by PCR NEGATIVE NEGATIVE Final    Comment: (NOTE) Fact Sheet for Patients: BloggerCourse.com  Fact Sheet for Healthcare Providers: SeriousBroker.it  This test is not yet approved or cleared by the [place] FDA and has been authorized for detection and/or diagnosis of SARS-CoV-2 by FDA  under an Emergency Use Authorization (EUA). This EUA will remain in effect (meaning this test can be used) for the duration of the COVID-19 declaration under Section 564(b)(1) of the Act, 21 U.S.C. section 360bbb-3(b)(1), unless the authorization is terminated or revoked.  Performed at [facility], [address] [place], [place] [address]      Labs: CBC: Recent Labs  Lab 05/04/23 [phone] 05/05/23 0538 05/06/23 0628 05/07/23 0608  WBC 6.8 5.7 5.0 4.8  NEUTROABS 5.0  --   --   --   HGB 15.8 15.1 13.8 13.0  HCT 48.4 47.3 42.8 40.3  MCV 90.0 92.0 90.3 89.2  PLT 248 224 227 219   Basic Metabolic Panel: Recent Labs  Lab 05/04/23 0854 05/05/23 0538 05/06/23 0628 05/07/23 0608  NA 135 135 139 139  K 3.7 3.5 3.7 3.5  CL 102 104 106 104  CO2 23 22 22 24   GLUCOSE 115* 98 112* 119*  BUN 18 27* 33* 26*  CREATININE 1.56* 1.86* 1.96* 1.65*  CALCIUM 9.0 8.6* 8.8* 8.7*  MG  --   --  2.0 2.0  PHOS  --   --  3.5 3.2   Liver Function Tests: Recent Labs  Lab  05/04/23 0854  AST 14*  ALT 14  ALKPHOS 74  BILITOT 1.2  PROT 8.0  ALBUMIN 4.0   No results for input(s): "LIPASE", "AMYLASE" in the last 168 hours. No results for input(s): "AMMONIA" in the last 168 hours. Cardiac Enzymes: No results for input(s): "CKTOTAL", "CKMB", "CKMBINDEX", "TROPONINI" in the last 168 hours. BNP (last 3 results) Recent Labs    05/04/23 0854  BNP 74.0   CBG: Recent Labs  Lab 05/06/23 0743 05/06/23 1231 05/06/23 1624 05/06/23 2020 05/07/23 0827  GLUCAP 134* 135* 87 204* 121*    Time spent: 35 minutes  Signed:  Gillis Santa  [facility] 05/07/2023 10:15 AM

## 2023-05-14 ENCOUNTER — Encounter (HOSPITAL_BASED_OUTPATIENT_CLINIC_OR_DEPARTMENT_OTHER): Payer: Medicaid Other | Admitting: Internal Medicine

## 2023-05-14 DIAGNOSIS — L97812 Non-pressure chronic ulcer of other part of right lower leg with fat layer exposed: Secondary | ICD-10-CM | POA: Diagnosis not present

## 2023-05-14 DIAGNOSIS — I87313 Chronic venous hypertension (idiopathic) with ulcer of bilateral lower extremity: Secondary | ICD-10-CM | POA: Diagnosis not present

## 2023-05-14 DIAGNOSIS — Z794 Long term (current) use of insulin: Secondary | ICD-10-CM | POA: Diagnosis not present

## 2023-05-14 DIAGNOSIS — L97822 Non-pressure chronic ulcer of other part of left lower leg with fat layer exposed: Secondary | ICD-10-CM | POA: Diagnosis not present

## 2023-05-14 DIAGNOSIS — E11622 Type 2 diabetes mellitus with other skin ulcer: Secondary | ICD-10-CM | POA: Diagnosis not present

## 2023-05-14 DIAGNOSIS — I872 Venous insufficiency (chronic) (peripheral): Secondary | ICD-10-CM | POA: Diagnosis not present

## 2023-05-14 DIAGNOSIS — Z86718 Personal history of other venous thrombosis and embolism: Secondary | ICD-10-CM | POA: Diagnosis not present

## 2023-05-14 DIAGNOSIS — I10 Essential (primary) hypertension: Secondary | ICD-10-CM | POA: Diagnosis not present

## 2023-05-15 NOTE — Progress Notes (Signed)
extremity wound all under 3 layer compression. Wounds are smaller. 10/8; last week we changed the primary dressing on the large right lower leg wound in  the middle of scar tissue to Hydrofera Blue. He comes in today with a surface not looking particularly healthy. ALSO he has had a reopening in the area of the original left calf wound apparently happened while he was washing off the wound area. He is put polymen silver on this and his own compression stocking since 10/15; the area on the left posterior calf is healed. The large wound on the right in the middle lips scar tissue we changed to Iodoflex I believe last week. Surface looks a lot better 10/29; the patient was hospitalized last week for a cellulitis in the submandibular region on the left. He was in hospital from 10/19 through 10/22. He feels a lot better. He says they took his wrap off and put Mepilex on the wound. He got out a week ago and that is what he has been doing at home. The wound is on the right lateral calf the area on the left posterior calf is healed as it was last time. The wound is in the middle of his surgical incision for a staph infection Electronic Signature(s) Signed: 05/14/2023 5:13:18 PM By: [name] MD Entered By: [name] on 05/14/2023 11:26:44 [name] ([id number]) [id number] Page 2 of 6 -------------------------------------------------------------------------------- Physical Exam Details Patient Name: Date of Service: [name]. 05/14/2023 9:45 A M Medical Record Number: [id number] Patient Account Number: [id number] Date of Birth/Sex: Treating RN: October 20, 1970 (52 y.o. M) Primary Care Provider: Barbette Reichmann Other Clinician: Referring Provider: Treating Provider/Extender: Milas Kocher Weeks in Treatment: 15 Constitutional Sitting or standing Blood Pressure is within target range for patient.. Pulse regular and within target range for patient.Marland Kitchen Respirations regular, non-labored and within target range.. Temperature is normal and within the target range for the patient.Marland Kitchen  Appears in no distress. Cardiovascular Pedal pulses palpable on the right. Notes Wound exam; right lateral lower leg. This has a fairly clean surface but I do not think it is changed much in dimensions. I did not think debridement was required. There is no evidence of surrounding infection Electronic Signature(s) Signed: 05/14/2023 5:13:18 PM By: [name] MD Entered By: [name] on 05/14/2023 11:27:42 -------------------------------------------------------------------------------- Physician Orders Details Patient Name: Date of Service: [name] [name] K. 05/14/2023 9:45 A M Medical Record Number: [id number] Patient Account Number: [id number] Date of Birth/Sex: Treating RN: May 25, 1971 (52 y.o. Charlean Merl, [name] Primary Care Provider: Barbette Reichmann Other Clinician: Referring Provider: Treating Provider/Extender: Milas Kocher Weeks in Treatment: 15 Verbal / Phone Orders: No Diagnosis Coding Follow-up Appointments ppointment in 1 week. - Dr. Leanord Hawking on Tuesday Return A Anesthetic (In clinic) Topical Lidocaine 4% applied to wound bed Cellular or Tissue Based Products Wound #1 Right,Lateral Lower Leg Cellular or Tissue Based Product Type: - Apligraf #1 applied 02/28/23 Apligraf #2 applied 03/05/2023 Apligraf #3 applied 03/12/2023 Apligraf #4 applied 03/19/2023 Apligraf #5 applied on 03/26/2023 Other Cellular or Tissue Based Products Orders/Instructions: - RUN IVR FOR THERASKIN-05/14/23 Bathing/ Shower/ Hygiene May shower with protection but do not get wound dressing(s) wet. Protect dressing(s) with water repellant cover (for example, large plastic bag) or a cast cover and may then take shower. - Please do not get the legs wet. Please use cast protectors when showering. Cast protectors can be purchased from [employer], Medical supply store. Cost ranges from $17-$37 Off-Loading Other: -  extremity wound all under 3 layer compression. Wounds are smaller. 10/8; last week we changed the primary dressing on the large right lower leg wound in  the middle of scar tissue to Hydrofera Blue. He comes in today with a surface not looking particularly healthy. ALSO he has had a reopening in the area of the original left calf wound apparently happened while he was washing off the wound area. He is put polymen silver on this and his own compression stocking since 10/15; the area on the left posterior calf is healed. The large wound on the right in the middle lips scar tissue we changed to Iodoflex I believe last week. Surface looks a lot better 10/29; the patient was hospitalized last week for a cellulitis in the submandibular region on the left. He was in hospital from 10/19 through 10/22. He feels a lot better. He says they took his wrap off and put Mepilex on the wound. He got out a week ago and that is what he has been doing at home. The wound is on the right lateral calf the area on the left posterior calf is healed as it was last time. The wound is in the middle of his surgical incision for a staph infection Electronic Signature(s) Signed: 05/14/2023 5:13:18 PM By: [name] MD Entered By: [name] on 05/14/2023 11:26:44 [name] ([id number]) [id number] Page 2 of 6 -------------------------------------------------------------------------------- Physical Exam Details Patient Name: Date of Service: [name]. 05/14/2023 9:45 A M Medical Record Number: [id number] Patient Account Number: [id number] Date of Birth/Sex: Treating RN: October 20, 1970 (52 y.o. M) Primary Care Provider: Barbette Reichmann Other Clinician: Referring Provider: Treating Provider/Extender: Milas Kocher Weeks in Treatment: 15 Constitutional Sitting or standing Blood Pressure is within target range for patient.. Pulse regular and within target range for patient.Marland Kitchen Respirations regular, non-labored and within target range.. Temperature is normal and within the target range for the patient.Marland Kitchen  Appears in no distress. Cardiovascular Pedal pulses palpable on the right. Notes Wound exam; right lateral lower leg. This has a fairly clean surface but I do not think it is changed much in dimensions. I did not think debridement was required. There is no evidence of surrounding infection Electronic Signature(s) Signed: 05/14/2023 5:13:18 PM By: [name] MD Entered By: [name] on 05/14/2023 11:27:42 -------------------------------------------------------------------------------- Physician Orders Details Patient Name: Date of Service: [name] [name] K. 05/14/2023 9:45 A M Medical Record Number: [id number] Patient Account Number: [id number] Date of Birth/Sex: Treating RN: May 25, 1971 (52 y.o. Charlean Merl, [name] Primary Care Provider: Barbette Reichmann Other Clinician: Referring Provider: Treating Provider/Extender: Milas Kocher Weeks in Treatment: 15 Verbal / Phone Orders: No Diagnosis Coding Follow-up Appointments ppointment in 1 week. - Dr. Leanord Hawking on Tuesday Return A Anesthetic (In clinic) Topical Lidocaine 4% applied to wound bed Cellular or Tissue Based Products Wound #1 Right,Lateral Lower Leg Cellular or Tissue Based Product Type: - Apligraf #1 applied 02/28/23 Apligraf #2 applied 03/05/2023 Apligraf #3 applied 03/12/2023 Apligraf #4 applied 03/19/2023 Apligraf #5 applied on 03/26/2023 Other Cellular or Tissue Based Products Orders/Instructions: - RUN IVR FOR THERASKIN-05/14/23 Bathing/ Shower/ Hygiene May shower with protection but do not get wound dressing(s) wet. Protect dressing(s) with water repellant cover (for example, large plastic bag) or a cast cover and may then take shower. - Please do not get the legs wet. Please use cast protectors when showering. Cast protectors can be purchased from [employer], Medical supply store. Cost ranges from $17-$37 Off-Loading Other: -  g) 1 x Per Week/30 Days Discharge  Instructions: Use triamcinolone 15 (g) as directed Peri-Wound Care: Sween Lotion (Moisturizing lotion) 1 x Per Week/30 Days Discharge Instructions: Apply moisturizing lotion as directed Prim Dressing: IODOFLEX 0.9% Cadexomer Iodine Pad 4x6 cm 1 x Per Week/30 Days ary Discharge Instructions: Apply to wound bed as instructed Secondary Dressing: Woven Gauze Sponge, Non-Sterile 4x4 in 1 x Per Week/30 Days Discharge Instructions: Apply over primary dressing as directed. Com pression Wrap: Urgo K2 Lite, (equivalent to a 3 layer) two layer compression system, regular 1 x Per Week/30 Days Discharge Instructions: Apply Urgo K2 Lite as directed (alternative to 3 layer compression). Com pression Wrap: Stockinette 1 x Per Week/30 Days 1. I continued with the Iodoflex under compression [Urgo K2 lite] 2. I would like to consider TheraSkin will put this through his insurance although this is a Medicaid situation. Will have to see if we can get this through. 3. I put him in compression in order to keep a wound dressing on the surface of this. Medicaid will not pay for specific dressings that the patient can change at home KAIA, DUDERSTADT K ([id number]) [id number] Page 6 of 6 Electronic Signature(s) Signed: 05/14/2023 5:13:18 PM By: [name] MD Entered By: [name] on 05/14/2023 11:28:43 -------------------------------------------------------------------------------- SuperBill Details Patient Name: Date of Service: [name] [name] K. 05/14/2023 Medical Record Number: [id number] Patient Account Number: [id number] Date of Birth/Sex: Treating RN: [birth year] (52 y.o. Charlean Merl, [name] Primary Care Provider: Barbette Reichmann Other Clinician: Referring Provider: Treating Provider/Extender: Milas Kocher Weeks in Treatment: 15 Diagnosis Coding ICD-10 Codes Code Description [phone] Non-pressure chronic ulcer of other part of right lower  leg with fat layer exposed L97.822 Non-pressure chronic ulcer of other part of left lower leg with fat layer exposed E11.622 Type 2 diabetes mellitus with other skin ulcer I87.313 Chronic venous hypertension (idiopathic) with ulcer of bilateral lower extremity Facility Procedures : CPT4 Code: [id number] Description: (Facility Use Only) [phone] - APPLY MULTLAY COMPRS LWR RT LEG Modifier: Quantity: 1 Physician Procedures : CPT4 Code Description Modifier [id number] - WC PHYS LEVEL 3 - EST PT ICD-10 Diagnosis Description L97.812 Non-pressure chronic ulcer of other part of right lower leg with fat layer exposed I87.313 Chronic venous hypertension (idiopathic) with ulcer  of bilateral lower extremity Quantity: 1 Electronic Signature(s) Signed: 05/14/2023 5:13:18 PM By: [name] MD Entered By: [name] on 05/14/2023 11:29:09  TAEJON, RODENBERGER ([id number]) [id number] Page 1 of 6 Visit Report for 05/14/2023 HPI Details Patient Name: Date of Service: [name]. 05/14/2023 9:45 A M Medical Record Number: [id number] Patient Account Number: [id number] Date of Birth/Sex: Treating RN: 11-02-1970 (52 y.o. M) Primary Care Provider: Barbette Reichmann Other Clinician: Referring Provider: Treating Provider/Extender: Milas Kocher Weeks in Treatment: 15 History of Present Illness HPI Description: 01/25/2023 Mr. Durant Rodriges is a 52 year old male with a past medical history of controlled type 2 diabetes on insulin, venous insufficiency and DVT that presents to the clinic for bilateral lower extremity wounds. He states that the wound on the left was started by a bug bite and has been present for the past 4 months. The right lower extremity wound he states started out as a soft tissue infection and has been present for the past 6 years. He has been treated by a wound care center in [facility] in [place]. He was last seen in the wound care center 2 years ago Could not continue following up due to lack of transportation. He states that recently the rehab center has been wrapping his legs with compression wrap and a wound dressing underneath. It is unclear when the last time this happened was. He is not wearing any compression today. He currently denies signs of infection. 7/19; patient presents for follow-up. We have been using antibiotic ointment with Hydrofera Blue under 3 layer compression to the lower extremities bilaterally. Wounds are smaller. 7/30; patient presents for follow-up. We have been using antibiotic ointment and Hydrofera Blue under 3 layer compression to the lower extremities bilaterally. Wounds are smaller. 8/6; patient presents for follow-up. We have been using antibiotic ointment with Hydrofera Blue under 3 layer compression to  the lower extremities bilaterally. Wounds are stable. He denies signs of infection. 10/1; the patient has not been here for 3 weeks. He completed his course of Apligraf to the wound on the right lower extremity. Both legs were wrapped for 3 weeks. Fortunately the area on the left lower leg is healed however the right leg had debris on the surface and maceration but fortunately no evidence of infection. He reminds me that he had an extensive surgery on this area apparently several years ago by Dr. Lajoyce Corners for a "staph infection". He has compression stockings from elastic therapy in [place] he got last May he says they are relatively new he is hardly worn them. 8/15; patient presents for follow-up. We have been using collagen to the left lower extremity and PolyMem silver to the right lower extremity all under 3 layer compression. Wounds are slightly smaller today. Patient has been approved for Apligraf and he was agreeable to having this placed in office. 8/20; patient presents for follow-up. At last clinic visit we used Apligraf to the right lower extremity under 3 layer compression and collagen to the left lower extremity under 3 layer compression. Wounds are smaller. 8/27; Patient presents for follow-up. We have been using Apligraf to the right lower extremity wound and collagen to the left lower extremity wound under 3 layer compression. Overall wounds appear well-healing. He has no issues or complaints. 9/30; patient presents for follow-up. We have been using Apligraf to the right lower extremity wound and switch to PolyMem to the left lower extremity wound. All under 3 layer compression. Wounds are well-healing. 9/10; patient presents for follow-up. We have been using Apligraf to the right lower extremity wound and PolyMem silver to the left lower  TAEJON, RODENBERGER ([id number]) [id number] Page 1 of 6 Visit Report for 05/14/2023 HPI Details Patient Name: Date of Service: [name]. 05/14/2023 9:45 A M Medical Record Number: [id number] Patient Account Number: [id number] Date of Birth/Sex: Treating RN: 11-02-1970 (52 y.o. M) Primary Care Provider: Barbette Reichmann Other Clinician: Referring Provider: Treating Provider/Extender: Milas Kocher Weeks in Treatment: 15 History of Present Illness HPI Description: 01/25/2023 Mr. Durant Rodriges is a 52 year old male with a past medical history of controlled type 2 diabetes on insulin, venous insufficiency and DVT that presents to the clinic for bilateral lower extremity wounds. He states that the wound on the left was started by a bug bite and has been present for the past 4 months. The right lower extremity wound he states started out as a soft tissue infection and has been present for the past 6 years. He has been treated by a wound care center in [place] [facility] in [place]. He was last seen in the wound care center 2 years ago Could not continue following up due to lack of transportation. He states that recently the rehab center has been wrapping his legs with compression wrap and a wound dressing underneath. It is unclear when the last time this happened was. He is not wearing any compression today. He currently denies signs of infection. 7/19; patient presents for follow-up. We have been using antibiotic ointment with Hydrofera Blue under 3 layer compression to the lower extremities bilaterally. Wounds are smaller. 7/30; patient presents for follow-up. We have been using antibiotic ointment and Hydrofera Blue under 3 layer compression to the lower extremities bilaterally. Wounds are smaller. 8/6; patient presents for follow-up. We have been using antibiotic ointment with Hydrofera Blue under 3 layer compression to  the lower extremities bilaterally. Wounds are stable. He denies signs of infection. 10/1; the patient has not been here for 3 weeks. He completed his course of Apligraf to the wound on the right lower extremity. Both legs were wrapped for 3 weeks. Fortunately the area on the left lower leg is healed however the right leg had debris on the surface and maceration but fortunately no evidence of infection. He reminds me that he had an extensive surgery on this area apparently several years ago by Dr. Lajoyce Corners for a "staph infection". He has compression stockings from elastic therapy in [place] he got last May he says they are relatively new he is hardly worn them. 8/15; patient presents for follow-up. We have been using collagen to the left lower extremity and PolyMem silver to the right lower extremity all under 3 layer compression. Wounds are slightly smaller today. Patient has been approved for Apligraf and he was agreeable to having this placed in office. 8/20; patient presents for follow-up. At last clinic visit we used Apligraf to the right lower extremity under 3 layer compression and collagen to the left lower extremity under 3 layer compression. Wounds are smaller. 8/27; Patient presents for follow-up. We have been using Apligraf to the right lower extremity wound and collagen to the left lower extremity wound under 3 layer compression. Overall wounds appear well-healing. He has no issues or complaints. 9/30; patient presents for follow-up. We have been using Apligraf to the right lower extremity wound and switch to PolyMem to the left lower extremity wound. All under 3 layer compression. Wounds are well-healing. 9/10; patient presents for follow-up. We have been using Apligraf to the right lower extremity wound and PolyMem silver to the left lower  TAEJON, RODENBERGER ([id number]) [id number] Page 1 of 6 Visit Report for 05/14/2023 HPI Details Patient Name: Date of Service: [name]. 05/14/2023 9:45 A M Medical Record Number: [id number] Patient Account Number: [id number] Date of Birth/Sex: Treating RN: 11-02-1970 (52 y.o. M) Primary Care Provider: Barbette Reichmann Other Clinician: Referring Provider: Treating Provider/Extender: Milas Kocher Weeks in Treatment: 15 History of Present Illness HPI Description: 01/25/2023 Mr. Durant Rodriges is a 52 year old male with a past medical history of controlled type 2 diabetes on insulin, venous insufficiency and DVT that presents to the clinic for bilateral lower extremity wounds. He states that the wound on the left was started by a bug bite and has been present for the past 4 months. The right lower extremity wound he states started out as a soft tissue infection and has been present for the past 6 years. He has been treated by a wound care center in [place] [facility] in [place]. He was last seen in the wound care center 2 years ago Could not continue following up due to lack of transportation. He states that recently the rehab center has been wrapping his legs with compression wrap and a wound dressing underneath. It is unclear when the last time this happened was. He is not wearing any compression today. He currently denies signs of infection. 7/19; patient presents for follow-up. We have been using antibiotic ointment with Hydrofera Blue under 3 layer compression to the lower extremities bilaterally. Wounds are smaller. 7/30; patient presents for follow-up. We have been using antibiotic ointment and Hydrofera Blue under 3 layer compression to the lower extremities bilaterally. Wounds are smaller. 8/6; patient presents for follow-up. We have been using antibiotic ointment with Hydrofera Blue under 3 layer compression to  the lower extremities bilaterally. Wounds are stable. He denies signs of infection. 10/1; the patient has not been here for 3 weeks. He completed his course of Apligraf to the wound on the right lower extremity. Both legs were wrapped for 3 weeks. Fortunately the area on the left lower leg is healed however the right leg had debris on the surface and maceration but fortunately no evidence of infection. He reminds me that he had an extensive surgery on this area apparently several years ago by Dr. Lajoyce Corners for a "staph infection". He has compression stockings from elastic therapy in [place] he got last May he says they are relatively new he is hardly worn them. 8/15; patient presents for follow-up. We have been using collagen to the left lower extremity and PolyMem silver to the right lower extremity all under 3 layer compression. Wounds are slightly smaller today. Patient has been approved for Apligraf and he was agreeable to having this placed in office. 8/20; patient presents for follow-up. At last clinic visit we used Apligraf to the right lower extremity under 3 layer compression and collagen to the left lower extremity under 3 layer compression. Wounds are smaller. 8/27; Patient presents for follow-up. We have been using Apligraf to the right lower extremity wound and collagen to the left lower extremity wound under 3 layer compression. Overall wounds appear well-healing. He has no issues or complaints. 9/30; patient presents for follow-up. We have been using Apligraf to the right lower extremity wound and switch to PolyMem to the left lower extremity wound. All under 3 layer compression. Wounds are well-healing. 9/10; patient presents for follow-up. We have been using Apligraf to the right lower extremity wound and PolyMem silver to the left lower

## 2023-05-16 NOTE — Progress Notes (Signed)
Primary Care [name]: Barbette Reichmann Other Clinician: Referring Neelah Mannings: Treating [name]/Extender: [name] Weeks in Treatment: 15 Active Problems Location of Pain Severity and Description of Pain Patient Has Paino Yes Site Locations Pain Location: Generalized Pain Rate the pain. Current Pain Level: 7 Pain Management and Medication Current Pain  Management: Electronic Signature(s) Signed: 05/15/2023 4:40:46 PM By: Thayer Dallas Entered By: Thayer Dallas on 05/14/2023 07:39:31 -------------------------------------------------------------------------------- Patient/Caregiver Education Details Patient Name: Date of Service: [name] 10/29/2024andnbsp9:45 A M Medical Record Number: [id number] Patient Account Number: [id number] Date of Birth/Gender: Treating RN: 1970/10/10 (52 y.o. [name] Primary Care Physician: Barbette Reichmann Other Clinician: Referring Physician: Treating Physician/Extender: [name], Price, [name] ([id number]) [id number] Page 6 of 8 Weeks in Treatment: 15 Education Assessment Education Provided To: Patient Education Topics Provided Wound/Skin Impairment: Methods: Explain/Verbal Responses: Reinforcements needed, State content correctly [employer]) Signed: 05/14/2023 4:23:12 PM By: Fonnie Mu RN Entered By: Fonnie Mu on 05/14/2023 07:49:07 -------------------------------------------------------------------------------- Wound Assessment Details Patient Name: Date of Service: [name] 05/14/2023 9:45 A M Medical Record Number: [id number] Patient Account Number: [id number] Date of Birth/Sex: Treating RN: 1971-06-01 (52 y.o. M) Primary Care [name]: Barbette Reichmann Other Clinician: Referring [name]: Treating [name]/Extender: [name] Weeks in Treatment: 15 Wound Status Wound Number: 1 Primary Diabetic Wound/Ulcer of the Lower Extremity Etiology: Wound Location: Right, Lateral Lower Leg Wound Open Wounding Event: Other Lesion Status: Date Acquired: 01/01/2018 Notes: Pt. 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 Weeks Of Treatment: 15 Comorbid Anemia, Deep Vein Thrombosis, Hypertension, Peripheral Venous Clustered Wound: No History:  Disease, Type II Diabetes, Gout Photos Wound Measurements Length: (cm) 6 Width: (cm) 2.5 Depth: (cm) 0.3 Area: (cm) 11.781 Volume: (cm) 3.534 % Reduction in Area: 9.9% % Reduction in Volume: 32.4% Epithelialization: Small (1-33%) Tunneling: No Undermining: No Wound Description Classification: Grade 2 Wound Margin: Thickened Exudate Amount: Medium Exudate Type: Serosanguineous Exudate Color: red, brown Foul Odor After Cleansing: No Slough/Fibrino Yes Wound Bed MCKAI, DEATS K ([id number]) [phone].pdf Page 7 of 8 Granulation Amount: Large (67-100%) Exposed Structure Granulation Quality: Red, Pink Fascia Exposed: No Necrotic Amount: Small (1-33%) Fat Layer (Subcutaneous Tissue) Exposed: Yes Necrotic Quality: Adherent Slough Tendon Exposed: No Muscle Exposed: No Joint Exposed: No Bone Exposed: No Periwound Skin Texture Texture Color No Abnormalities Noted: No No Abnormalities Noted: No Callus: No Atrophie Blanche: No Crepitus: No Cyanosis: No Excoriation: No Ecchymosis: No Induration: No Erythema: No Rash: No Hemosiderin Staining: Yes Scarring: No Mottled: No Pallor: No Moisture Rubor: No No Abnormalities Noted: No Dry / Scaly: No Temperature / Pain Maceration: No Temperature: No Abnormality Treatment Notes Wound #1 (Lower Leg) Wound Laterality: Right, Lateral Cleanser Soap and Water Discharge Instruction: May shower and wash wound with dial antibacterial soap and water prior to dressing change. Vashe 5.8 (oz) Discharge Instruction: Cleanse the wound with Vashe prior to applying a clean dressing using gauze sponges, not tissue or cotton balls. Peri-Wound Care Triamcinolone 15 (g) Discharge Instruction: Use triamcinolone 15 (g) as directed Sween Lotion (Moisturizing lotion) Discharge Instruction: Apply moisturizing lotion as directed Topical Primary Dressing IODOFLEX 0.9% Cadexomer Iodine Pad 4x6 cm Discharge Instruction:  Apply to wound bed as instructed Secondary Dressing Woven Gauze Sponge, Non-Sterile 4x4 in Discharge Instruction: Apply over primary dressing as directed. Secured With Compression Wrap Urgo K2 Lite, (equivalent to a 3 layer) two layer compression system, regular Discharge Instruction: Apply Urgo K2 Lite as directed (alternative to 3 layer compression).  AUSTINJAMES, TREASE ([id number]) [id number] Page 1 of 8 Visit Report for 05/14/2023 Arrival Information Details Patient Name: Date of Service: [name] 05/14/2023 9:45 A M Medical Record Number: [id number] Patient Account Number: [id number] Date of Birth/Sex: Treating RN: 06/08/1971 (52 y.o. M) Primary Care [name]: Barbette Reichmann Other Clinician: Referring [name]: Treating [name]/Extender: Imagene Sheller in Treatment: 15 Visit Information History Since Last Visit Added or deleted any medications: No Patient Arrived: Ambulatory Any new allergies or adverse reactions: No Arrival Time: 10:37 Had a fall or experienced change in No Accompanied By: self activities of daily living that may affect Transfer Assistance: None risk of falls: Patient Identification Verified: Yes Signs or symptoms of abuse/neglect since last visito No Secondary Verification Process Completed: Yes Hospitalized since last visit: No Patient Requires Transmission-Based Precautions: No Implantable device outside of the clinic excluding No Patient Has Alerts: No cellular tissue based products placed in the center since last visit: Has Dressing in Place as Prescribed: Yes Has Compression in Place as Prescribed: Yes Pain Present Now: Yes Electronic Signature(s) Signed: 05/15/2023 4:40:46 PM By: Thayer Dallas Entered By: Thayer Dallas on 05/14/2023 07:38:58 -------------------------------------------------------------------------------- Compression Therapy Details Patient Name: Date of Service: [name] 05/14/2023 9:45 A M Medical Record Number: [id number] Patient Account Number: [id number] Date of Birth/Sex: Treating RN: Nov 11, 1970 (52 y.o. [name] Primary Care Fredrica Capano: Barbette Reichmann Other Clinician: Referring Luetta Piazza: Treating Bridgit Eynon/Extender: [name] Weeks in Treatment:  15 Compression Therapy Performed for Wound Assessment: Wound #1 Right,Lateral Lower Leg Performed By: Clinician Fonnie Mu, RN Compression Type: Three Layer Post Procedure Diagnosis Same as Pre-procedure Electronic Signature(s) Signed: 05/14/2023 4:23:12 PM By: Fonnie Mu RN Entered By: Fonnie Mu on 05/14/2023 08:06:30 [name] ([id number]) [id number].pdf Page 2 of 8 -------------------------------------------------------------------------------- Encounter Discharge Information Details Patient Name: Date of Service: [name] 05/14/2023 9:45 A M Medical Record Number: [id number] Patient Account Number: [id number] Date of Birth/Sex: Treating RN: 1971/05/11 (52 y.o. [name] Primary Care Alivea Gladson: Barbette Reichmann Other Clinician: Referring Camil Hausmann: Treating Jarah Pember/Extender: Imagene Sheller in Treatment: 15 Encounter Discharge Information Items Discharge Condition: Stable Ambulatory Status: Ambulatory Discharge Destination: Home Transportation: Private Auto Accompanied By: self Schedule Follow-up Appointment: Yes Clinical Summary of Care: Patient Declined Electronic Signature(s) Signed: 05/14/2023 4:23:12 PM By: Fonnie Mu RN Entered By: Fonnie Mu on 05/14/2023 08:09:24 -------------------------------------------------------------------------------- Lower Extremity Assessment Details Patient Name: Date of Service: [name] 05/14/2023 9:45 A M Medical Record Number: [id number] Patient Account Number: [id number] Date of Birth/Sex: Treating RN: [birth year] (52 y.o. M) Primary Care Terril Chestnut: Barbette Reichmann Other Clinician: Referring Neco Kling: Treating Kenry Daubert/Extender: [name] Weeks in Treatment: 15 Edema Assessment Assessed: [Left: No] [Right: No] Edema: [Left: N] [Right: o] Calf Left: Right: Point of  Measurement: 34 cm From Medial Instep 32.2 cm Ankle Left: Right: Point of Measurement: 10 cm From Medial Instep 21.1 cm Vascular Assessment Extremity colors, hair growth, and conditions: Hair Growth on Extremity: [Right:No] Temperature of Extremity: [Right:Warm] Capillary Refill: [Right:< 3 seconds] Dependent Rubor: [Right:No No] Electronic Signature(s) Signed: 05/15/2023 4:40:46 PM By: Thayer Dallas Entered By: Thayer Dallas on 05/14/2023 07:45:43 [name] ([id number]) [id number].pdf Page 3 of 8 -------------------------------------------------------------------------------- Multi Wound Chart Details Patient Name: Date of Service: [name] 05/14/2023 9:45 A M Medical Record Number: [id number] Patient Account Number: [id number] Date of Birth/Sex: Treating RN: April 04, 1971 (52 y.o. M) Primary Care Anzal Bartnick: Barbette Reichmann Other  Clinician: Referring Faizan Geraci: Treating Danel Requena/Extender: [name] Weeks in Treatment: 15 Vital Signs Height(in): 70 Pulse(bpm): 62 Weight(lbs): 264 Blood Pressure(mmHg): 104/68 Body Mass Index(BMI): 37.9 Temperature(F): 98.5 Respiratory Rate(breaths/min): 18 [1:Photos:] [N/A:N/A] Right, Lateral Lower Leg N/A N/A Wound Location: Other Lesion N/A N/A Wounding Event: Diabetic Wound/Ulcer of the Lower N/A N/A Primary Etiology: Extremity Anemia, Deep Vein Thrombosis, N/A N/A Comorbid History: Hypertension, Peripheral Venous Disease, Type II Diabetes, Gout 01/01/2018 N/A N/A Date Acquired: 15 N/A N/A Weeks of Treatment: Open N/A N/A Wound Status: No N/A N/A Wound Recurrence: 6x2.5x0.3 N/A N/A Measurements L x W x D (cm) 11.781 N/A N/A A (cm) : rea 3.534 N/A N/A Volume (cm) : 9.90% N/A N/A % Reduction in A rea: 32.40% N/A N/A % Reduction in Volume: Grade 2 N/A N/A Classification: Medium N/A N/A Exudate A mount: Serosanguineous N/A N/A Exudate Type: red,  brown N/A N/A Exudate Color: Thickened N/A N/A Wound Margin: Large (67-100%) N/A N/A Granulation A mount: Red, Pink N/A N/A Granulation Quality: Small (1-33%) N/A N/A Necrotic A mount: Fat Layer (Subcutaneous Tissue): Yes N/A N/A Exposed Structures: Fascia: No Tendon: No Muscle: No Joint: No Bone: No Small (1-33%) N/A N/A Epithelialization: Excoriation: No N/A N/A Periwound Skin Texture: Induration: No Callus: No Crepitus: No Rash: No Scarring: No Maceration: No N/A N/A Periwound Skin Moisture: Dry/Scaly: No Hemosiderin Staining: Yes N/A N/A Periwound Skin Color: Atrophie Blanche: No Cyanosis: No Ecchymosis: No Erythema: No Mottled: No Pallor: No Rubor: No No Abnormality N/A N/A TemperatureDEREN, CAZENAVE ([id number]) [id number].pdf Page 4 of 8 Compression Therapy N/A N/A Procedures Performed: Treatment Notes Wound #1 (Lower Leg) Wound Laterality: Right, Lateral Cleanser Soap and Water Discharge Instruction: May shower and wash wound with dial antibacterial soap and water prior to dressing change. Vashe 5.8 (oz) Discharge Instruction: Cleanse the wound with Vashe prior to applying a clean dressing using gauze sponges, not tissue or cotton balls. Peri-Wound Care Triamcinolone 15 (g) Discharge Instruction: Use triamcinolone 15 (g) as directed Sween Lotion (Moisturizing lotion) Discharge Instruction: Apply moisturizing lotion as directed Topical Primary Dressing IODOFLEX 0.9% Cadexomer Iodine Pad 4x6 cm Discharge Instruction: Apply to wound bed as instructed Secondary Dressing Woven Gauze Sponge, Non-Sterile 4x4 in Discharge Instruction: Apply over primary dressing as directed. Secured With Compression Wrap Urgo K2 Lite, (equivalent to a 3 layer) two layer compression system, regular Discharge Instruction: Apply Urgo K2 Lite as directed (alternative to 3 layer compression). [employer]  [employer] Facilities manager) Signed: 05/14/2023 5:13:18 PM By: [name] MD Entered By: [name] on 05/14/2023 08:25:33 -------------------------------------------------------------------------------- Multi-Disciplinary Care Plan Details Patient Name: Date of Service: [name] Fortes K. 05/14/2023 9:45 A M Medical Record Number: [id number] Patient Account Number: [id number] Date of Birth/Sex: Treating RN: 03/22/1971 (52 y.o. [name] Primary Care Sante Biedermann: Barbette Reichmann Other Clinician: Referring Kore Madlock: Treating Natsha Guidry/Extender: [name] Weeks in Treatment: 15 Active Inactive Wound/Skin Impairment Nursing Diagnoses: Impaired tissue integrity Goals: Patient/caregiver will verbalize understanding of skin care regimen Date Initiated: 01/25/2023 Target Resolution Date: 05/16/2023 Goal Status: Active CORLIS, ROSSEN ([id number]) [phone].pdf Page 5 of 8 Interventions: Assess ulceration(s) every visit Treatment Activities: Skin care regimen initiated : 01/25/2023 Notes: Electronic Signature(s) Signed: 05/14/2023 4:23:12 PM By: Fonnie Mu RN Entered By: Fonnie Mu on 05/14/2023 07:48:55 -------------------------------------------------------------------------------- Pain Assessment Details Patient Name: Date of Service: [name] 05/14/2023 9:45 A M Medical Record Number: [id number] Patient Account Number: [id number] Date of Birth/Sex: Treating RN: 1971-01-13 (52 y.o. M)  Clinician: Referring Faizan Geraci: Treating Danel Requena/Extender: [name] Weeks in Treatment: 15 Vital Signs Height(in): 70 Pulse(bpm): 62 Weight(lbs): 264 Blood Pressure(mmHg): 104/68 Body Mass Index(BMI): 37.9 Temperature(F): 98.5 Respiratory Rate(breaths/min): 18 [1:Photos:] [N/A:N/A] Right, Lateral Lower Leg N/A N/A Wound Location: Other Lesion N/A N/A Wounding Event: Diabetic Wound/Ulcer of the Lower N/A N/A Primary Etiology: Extremity Anemia, Deep Vein Thrombosis, N/A N/A Comorbid History: Hypertension, Peripheral Venous Disease, Type II Diabetes, Gout 01/01/2018 N/A N/A Date Acquired: 15 N/A N/A Weeks of Treatment: Open N/A N/A Wound Status: No N/A N/A Wound Recurrence: 6x2.5x0.3 N/A N/A Measurements L x W x D (cm) 11.781 N/A N/A A (cm) : rea 3.534 N/A N/A Volume (cm) : 9.90% N/A N/A % Reduction in A rea: 32.40% N/A N/A % Reduction in Volume: Grade 2 N/A N/A Classification: Medium N/A N/A Exudate A mount: Serosanguineous N/A N/A Exudate Type: red,  brown N/A N/A Exudate Color: Thickened N/A N/A Wound Margin: Large (67-100%) N/A N/A Granulation A mount: Red, Pink N/A N/A Granulation Quality: Small (1-33%) N/A N/A Necrotic A mount: Fat Layer (Subcutaneous Tissue): Yes N/A N/A Exposed Structures: Fascia: No Tendon: No Muscle: No Joint: No Bone: No Small (1-33%) N/A N/A Epithelialization: Excoriation: No N/A N/A Periwound Skin Texture: Induration: No Callus: No Crepitus: No Rash: No Scarring: No Maceration: No N/A N/A Periwound Skin Moisture: Dry/Scaly: No Hemosiderin Staining: Yes N/A N/A Periwound Skin Color: Atrophie Blanche: No Cyanosis: No Ecchymosis: No Erythema: No Mottled: No Pallor: No Rubor: No No Abnormality N/A N/A TemperatureDEREN, CAZENAVE ([id number]) [id number].pdf Page 4 of 8 Compression Therapy N/A N/A Procedures Performed: Treatment Notes Wound #1 (Lower Leg) Wound Laterality: Right, Lateral Cleanser Soap and Water Discharge Instruction: May shower and wash wound with dial antibacterial soap and water prior to dressing change. Vashe 5.8 (oz) Discharge Instruction: Cleanse the wound with Vashe prior to applying a clean dressing using gauze sponges, not tissue or cotton balls. Peri-Wound Care Triamcinolone 15 (g) Discharge Instruction: Use triamcinolone 15 (g) as directed Sween Lotion (Moisturizing lotion) Discharge Instruction: Apply moisturizing lotion as directed Topical Primary Dressing IODOFLEX 0.9% Cadexomer Iodine Pad 4x6 cm Discharge Instruction: Apply to wound bed as instructed Secondary Dressing Woven Gauze Sponge, Non-Sterile 4x4 in Discharge Instruction: Apply over primary dressing as directed. Secured With Compression Wrap Urgo K2 Lite, (equivalent to a 3 layer) two layer compression system, regular Discharge Instruction: Apply Urgo K2 Lite as directed (alternative to 3 layer compression). [employer]ression  Stockings Facilities manager) Signed: 05/14/2023 5:13:18 PM By: [name] MD Entered By: [name] on 05/14/2023 08:25:33 -------------------------------------------------------------------------------- Multi-Disciplinary Care Plan Details Patient Name: Date of Service: [name] Fortes K. 05/14/2023 9:45 A M Medical Record Number: [id number] Patient Account Number: [id number] Date of Birth/Sex: Treating RN: 03/22/1971 (52 y.o. [name] Primary Care Sante Biedermann: Barbette Reichmann Other Clinician: Referring Kore Madlock: Treating Natsha Guidry/Extender: [name] Weeks in Treatment: 15 Active Inactive Wound/Skin Impairment Nursing Diagnoses: Impaired tissue integrity Goals: Patient/caregiver will verbalize understanding of skin care regimen Date Initiated: 01/25/2023 Target Resolution Date: 05/16/2023 Goal Status: Active CORLIS, ROSSEN ([id number]) [phone].pdf Page 5 of 8 Interventions: Assess ulceration(s) every visit Treatment Activities: Skin care regimen initiated : 01/25/2023 Notes: Electronic Signature(s) Signed: 05/14/2023 4:23:12 PM By: Fonnie Mu RN Entered By: Fonnie Mu on 05/14/2023 07:48:55 -------------------------------------------------------------------------------- Pain Assessment Details Patient Name: Date of Service: [name] 05/14/2023 9:45 A M Medical Record Number: [id number] Patient Account Number: [id number] Date of Birth/Sex: Treating RN: 1971-01-13 (52 y.o. M)

## 2023-05-21 ENCOUNTER — Encounter (HOSPITAL_BASED_OUTPATIENT_CLINIC_OR_DEPARTMENT_OTHER): Payer: Medicaid Other | Attending: Internal Medicine | Admitting: Internal Medicine

## 2023-05-21 DIAGNOSIS — L97812 Non-pressure chronic ulcer of other part of right lower leg with fat layer exposed: Secondary | ICD-10-CM | POA: Diagnosis not present

## 2023-05-21 DIAGNOSIS — Z86718 Personal history of other venous thrombosis and embolism: Secondary | ICD-10-CM | POA: Insufficient documentation

## 2023-05-21 DIAGNOSIS — L97822 Non-pressure chronic ulcer of other part of left lower leg with fat layer exposed: Secondary | ICD-10-CM | POA: Insufficient documentation

## 2023-05-21 DIAGNOSIS — E11622 Type 2 diabetes mellitus with other skin ulcer: Secondary | ICD-10-CM | POA: Insufficient documentation

## 2023-05-21 DIAGNOSIS — I87313 Chronic venous hypertension (idiopathic) with ulcer of bilateral lower extremity: Secondary | ICD-10-CM | POA: Insufficient documentation

## 2023-05-21 DIAGNOSIS — Z794 Long term (current) use of insulin: Secondary | ICD-10-CM | POA: Diagnosis not present

## 2023-05-22 NOTE — Progress Notes (Signed)
ASHAZ, ROBLING ([id number]) [phone].pdf Page 1 of 6 Visit Report for 05/21/2023 HPI Details Patient Name: Date of Service: [name] 05/21/2023 10:15 A M Medical Record Number: [id number] Patient Account Number: [id number] Date of Birth/Sex: Treating RN: 1971/02/05 (52 y.o. M) Primary Care Provider: Barbette Reichmann Other Clinician: Referring Provider: Treating Provider/Extender: Milas Kocher Weeks in Treatment: 16 History of Present Illness HPI Description: 01/25/2023 [name] is a 52 year old male with a past medical history of controlled type 2 diabetes on insulin, venous insufficiency and DVT that presents to the clinic for bilateral lower extremity wounds. He states that the wound on the left was started by a bug bite and has been present for the past 4 months. The right lower extremity wound he states started out as a soft tissue infection and has been present for the past 6 years. He has been treated by a wound care center in [place] [facility] in [place]. He was last seen in the wound care center 2 years ago Could not continue following up due to lack of transportation. He states that recently the rehab center has been wrapping his legs with compression wrap and a wound dressing underneath. It is unclear when the last time this happened was. He is not wearing any compression today. He currently denies signs of infection. 7/19; patient presents for follow-up. We have been using antibiotic ointment with Hydrofera Blue under 3 layer compression to the lower extremities bilaterally. Wounds are smaller. 7/30; patient presents for follow-up. We have been using antibiotic ointment and Hydrofera Blue under 3 layer compression to the lower extremities bilaterally. Wounds are smaller. 8/6; patient presents for follow-up. We have been using antibiotic ointment with Hydrofera Blue under 3 layer compression to  the lower extremities bilaterally. Wounds are stable. He denies signs of infection. 10/1; the patient has not been here for 3 weeks. He completed his course of Apligraf to the wound on the right lower extremity. Both legs were wrapped for 3 weeks. Fortunately the area on the left lower leg is healed however the right leg had debris on the surface and maceration but fortunately no evidence of infection. He reminds me that he had an extensive surgery on this area apparently several years ago by Dr. Lajoyce Corners for a "staph infection". He has compression stockings from elastic therapy in [place] he got last May he says they are relatively new he is hardly worn them. 8/15; patient presents for follow-up. We have been using collagen to the left lower extremity and PolyMem silver to the right lower extremity all under 3 layer compression. Wounds are slightly smaller today. Patient has been approved for Apligraf and he was agreeable to having this placed in office. 8/20; patient presents for follow-up. At last clinic visit we used Apligraf to the right lower extremity under 3 layer compression and collagen to the left lower extremity under 3 layer compression. Wounds are smaller. 8/27; Patient presents for follow-up. We have been using Apligraf to the right lower extremity wound and collagen to the left lower extremity wound under 3 layer compression. Overall wounds appear well-healing. He has no issues or complaints. 9/30; patient presents for follow-up. We have been using Apligraf to the right lower extremity wound and switch to PolyMem to the left lower extremity wound. All under 3 layer compression. Wounds are well-healing. 9/10; patient presents for follow-up. We have been using Apligraf to the right lower extremity wound and PolyMem silver to the left lower  extremity wound all under 3 layer compression. Wounds are smaller. 10/8; last week we changed the primary dressing on the large right lower leg wound in  the middle of scar tissue to Hydrofera Blue. He comes in today with a surface not looking particularly healthy. ALSO he has had a reopening in the area of the original left calf wound apparently happened while he was washing off the wound area. He is put polymen silver on this and his own compression stocking since 10/15; the area on the left posterior calf is healed. The large wound on the right in the middle lips scar tissue we changed to Iodoflex I believe last week. Surface looks a lot better 10/29; the patient was hospitalized last week for a cellulitis in the submandibular region on the left. He was in hospital from 10/19 through 10/22. He feels a lot better. He says they took his wrap off and put Mepilex on the wound. He got out a week ago and that is what he has been doing at home. The wound is on the right lateral calf the area on the left posterior calf is healed as it was last time. The wound is in the middle of his surgical incision for a staph infection 11/5; wound on the right lateral leg the area on the left posterior calf remains healed. This is likely chronic venous in etiology. He also had a lot of scar in this area from surgery had by Dr. Lajoyce Corners several years ago for staph infection. He had a run of Apligraf in September. We got approved for TheraSkin although he has a lot of drainage here I think we can make any skin substitute not a viable option at the moment Electronic Signature(s) Signed: 05/21/2023 4:32:14 PM By: [name] MD Entered By: [name] on 05/21/2023 11:03:32 [name] ([id number]) [id number] Page 2 of 6 -------------------------------------------------------------------------------- Physical Exam Details Patient Name: Date of Service: [name] 05/21/2023 10:15 A M Medical Record Number: [id number] Patient Account Number: [id number] Date of Birth/Sex: Treating RN: [birth year] (52 y.o. M) Primary Care  Provider: Barbette Reichmann Other Clinician: Referring Provider: Treating Provider/Extender: Milas Kocher Weeks in Treatment: 16 Constitutional Sitting or standing Blood Pressure is within target range for patient.. Pulse regular and within target range for patient.Marland Kitchen Respirations regular, non-labored and within target range.. Temperature is normal and within the target range for the patient.Marland Kitchen Appears in no distress. Notes Wound exam; right lateral lower leg in the middle of scar tissue from previous surgery in the area. Friable granulation but overall not too bad. No surrounding edema no evidence of infection Electronic Signature(s) Signed: 05/21/2023 4:32:14 PM By: [name] MD Entered By: [name] on 05/21/2023 11:04:44 -------------------------------------------------------------------------------- Physician Orders Details Patient Name: Date of Service: [name] 05/21/2023 10:15 A M Medical Record Number: [id number] Patient Account Number: [id number] Date of Birth/Sex: Treating RN: Jan 05, 1971 (52 y.o. [name] Primary Care Provider: Barbette Reichmann Other Clinician: Referring Provider: Treating Provider/Extender: Imagene Sheller in Treatment: 16 The following information was scribed by: Shawn Stall The information was scribed for: [name] Verbal / Phone Orders: No Diagnosis Coding ICD-10 Coding Code Description [phone] Non-pressure chronic ulcer of other part of right lower leg with fat layer exposed L97.822 Non-pressure chronic ulcer of other part of left lower leg with fat layer exposed E11.622 Type 2 diabetes mellitus with other skin ulcer I87.313 Chronic venous hypertension (idiopathic) with ulcer of bilateral lower extremity  Follow-up Appointments ppointment in 1 week. - Dr. Leanord Hawking on Tuesday 05/28/2023 815am. Return A ppointment in 2 weeks. - Dr. Leanord Hawking 08/03/2022 1100am Return  A Nurse Visit: - 06/11/2023 Tuesday Anesthetic (In clinic) Topical Lidocaine 4% applied to wound bed Cellular or Tissue Based Products Wound #1 Right,Lateral Lower Leg Cellular or Tissue Based Product Type: - Apligraf #1 applied 02/28/23 Apligraf #2 applied 03/05/2023 Apligraf #3 applied 03/12/2023 Apligraf #4 applied 03/19/2023 Apligraf #5 applied on 03/26/2023 Other Cellular or Tissue Based Products Orders/Instructions: - RUN IVR FOR [place]-05/14/23- approved 100%- have to rerun on 06/16/2023 due to insurance renews. [name], [name] ([id number]) [phone].pdf Page 3 of 6 Bathing/ Shower/ Hygiene May shower with protection but do not get wound dressing(s) wet. Protect dressing(s) with water repellant cover (for example, large plastic bag) or a cast cover and may then take shower. - Please do not get the legs wet. Please use cast protectors when showering. Cast protectors can be purchased from [employer], Medical supply store. Cost ranges from $17-$37 Off-Loading Other: - Keep legs elevated to heart level or above when sitting. You may place a pillow behind the calf for comfort, if so desired. Wound Treatment Wound #1 - Lower Leg Wound Laterality: Right, Lateral Cleanser: Soap and Water 1 x Per Week/30 Days Discharge Instructions: May shower and wash wound with dial antibacterial soap and water prior to dressing change. Cleanser: Vashe 5.8 (oz) 1 x Per Week/30 Days Discharge Instructions: Cleanse the wound with Vashe prior to applying a clean dressing using gauze sponges, not tissue or cotton balls. Peri-Wound Care: Triamcinolone 15 (g) 1 x Per Week/30 Days Discharge Instructions: Use triamcinolone 15 (g) as directed Peri-Wound Care: Sween Lotion (Moisturizing lotion) 1 x Per Week/30 Days Discharge Instructions: Apply moisturizing lotion as directed Prim Dressing: Maxorb Extra Ag+ Alginate Dressing, 2x2 (in/in) 1 x Per Week/30  Days ary Discharge Instructions: Apply to wound bed as instructed Secondary Dressing: Woven Gauze Sponge, Non-Sterile 4x4 in 1 x Per Week/30 Days Discharge Instructions: Apply over primary dressing as directed. Secondary Dressing: Zetuvit Plus 4x8 in 1 x Per Week/30 Days Discharge Instructions: Apply over primary dressing as directed. Compression Wrap: Urgo K2 Lite, (equivalent to a 3 layer) two layer compression system, regular 1 x Per Week/30 Days Discharge Instructions: Apply Urgo K2 Lite as directed (alternative to 3 layer compression). Compression Wrap: Stockinette 1 x Per Week/30 Days Electronic Signature(s) Signed: 05/21/2023 12:20:53 PM By: Shawn Stall RN, BSN Signed: 05/21/2023 4:32:14 PM By: [name] MD Entered By: Shawn Stall on 05/21/2023 10:54:57 -------------------------------------------------------------------------------- Problem List Details Patient Name: Date of Service: [name] 05/21/2023 10:15 A M Medical Record Number: [id number] Patient Account Number: [id number] Date of Birth/Sex: Treating RN: 07-22-1970 (52 y.o. [name] Primary Care Provider: Barbette Reichmann Other Clinician: Referring Provider: Treating Provider/Extender: Milas Kocher Weeks in Treatment: 16 Active Problems ICD-10 Encounter Code Description Active Date MDM Diagnosis L97.812 Non-pressure chronic ulcer of other part of right lower leg with fat layer 01/25/2023 No Yes exposed E11.622 Type 2 diabetes mellitus with other skin ulcer 01/25/2023 No Yes I87.313 Chronic venous hypertension (idiopathic) with ulcer of bilateral lower extremity 01/25/2023 No Yes [name], [name] K ([id number]) [id number] Page 4 of 6 Inactive Problems ICD-10 Code Description Active Date Inactive Date L97.822 Non-pressure chronic ulcer of other part of left lower leg with fat layer exposed 01/25/2023 01/25/2023 Resolved Problems Electronic  Signature(s) Signed: 05/21/2023 4:32:14 PM By: [name] MD Entered By: [name] on 05/21/2023 11:01:20 --------------------------------------------------------------------------------  Progress Note Details Patient Name: Date of Service: [name] 05/21/2023 10:15 A M Medical Record Number: [id number] Patient Account Number: [id number] Date of Birth/Sex: Treating RN: [birth year] (52 y.o. M) Primary Care Provider: Barbette Reichmann Other Clinician: Referring Provider: Treating Provider/Extender: Milas Kocher Weeks in Treatment: 16 Subjective History of Present Illness (HPI) 01/25/2023 Mr. Jhayden Demuro is a 52 year old male with a past medical history of controlled type 2 diabetes on insulin, venous insufficiency and DVT that presents to the clinic for bilateral lower extremity wounds. He states that the wound on the left was started by a bug bite and has been present for the past 4 months. The right lower extremity wound he states started out as a soft tissue infection and has been present for the past 6 years. He has been treated by a wound care center in [place] [facility] in [place]. He was last seen in the wound care center 2 years ago Could not continue following up due to lack of transportation. He states that recently the rehab center has been wrapping his legs with compression wrap and a wound dressing underneath. It is unclear when the last time this happened was. He is not wearing any compression today. He currently denies signs of infection. 7/19; patient presents for follow-up. We have been using antibiotic ointment with Hydrofera Blue under 3 layer compression to the lower extremities bilaterally. Wounds are smaller. 7/30; patient presents for follow-up. We have been using antibiotic ointment and Hydrofera Blue under 3 layer compression to the lower extremities bilaterally. Wounds are smaller. 8/6; patient presents for  follow-up. We have been using antibiotic ointment with Hydrofera Blue under 3 layer compression to the lower extremities bilaterally. Wounds are stable. He denies signs of infection. 10/1; the patient has not been here for 3 weeks. He completed his course of Apligraf to the wound on the right lower extremity. Both legs were wrapped for 3 weeks. Fortunately the area on the left lower leg is healed however the right leg had debris on the surface and maceration but fortunately no evidence of infection. He reminds me that he had an extensive surgery on this area apparently several years ago by Dr. Lajoyce Corners for a "staph infection". He has compression stockings from elastic therapy in [place] he got last May he says they are relatively new he is hardly worn them. 8/15; patient presents for follow-up. We have been using collagen to the left lower extremity and PolyMem silver to the right lower extremity all under 3 layer compression. Wounds are slightly smaller today. Patient has been approved for Apligraf and he was agreeable to having this placed in office. 8/20; patient presents for follow-up. At last clinic visit we used Apligraf to the right lower extremity under 3 layer compression and collagen to the left lower extremity under 3 layer compression. Wounds are smaller. 8/27; Patient presents for follow-up. We have been using Apligraf to the right lower extremity wound and collagen to the left lower extremity wound under 3 layer compression. Overall wounds appear well-healing. He has no issues or complaints. 9/30; patient presents for follow-up. We have been using Apligraf to the right lower extremity wound and switch to PolyMem to the left lower extremity wound. All under 3 layer compression. Wounds are well-healing. 9/10; patient presents for follow-up. We have been using Apligraf to the right lower extremity wound and PolyMem silver to the left lower extremity wound all under 3 layer compression. Wounds  are smaller. 10/8;  last week we changed the primary dressing on the large right lower leg wound in the middle of scar tissue to [facility]. He comes in today with a surface not looking particularly healthy. ALSO he has had a reopening in the area of the original left calf wound apparently happened while he was washing off the wound area. He is put polymen silver on this and his own compression stocking since 10/15; the area on the left posterior calf is healed. The large wound on the right in the middle lips scar tissue we changed to Iodoflex I believe last week. Surface looks a lot better 10/29; the patient was hospitalized last week for a cellulitis in the submandibular region on the left. He was in hospital from 10/19 through 10/22. He feels a lot [name], [name] ([id number]) [id number] Page 5 of 6 better. He says they took his wrap off and put Mepilex on the wound. He got out a week ago and that is what he has been doing at home. The wound is on the right lateral calf the area on the left posterior calf is healed as it was last time. The wound is in the middle of his surgical incision for a staph infection 11/5; wound on the right lateral leg the area on the left posterior calf remains healed. This is likely chronic venous in etiology. He also had a lot of scar in this area from surgery had by Dr. Lajoyce Corners several years ago for staph infection. He had a run of Apligraf in September. We got approved for TheraSkin although he has a lot of drainage here I think we can make any skin substitute not a viable option at the moment Objective Constitutional Sitting or standing Blood Pressure is within target range for patient.. Pulse regular and within target range for patient.Marland Kitchen Respirations regular, non-labored and within target range.. Temperature is normal and within the target range for the patient.Marland Kitchen Appears in no distress. Vitals Time Taken: 10:22 AM, Height: 70 in,  Weight: 264 lbs, BMI: 37.9, Temperature: 97.5 F, Pulse: 64 bpm, Respiratory Rate: 18 breaths/min, Blood Pressure: 121/75 mmHg. General Notes: Wound exam; right lateral lower leg in the middle of scar tissue from previous surgery in the area. Friable granulation but overall not too bad. No surrounding edema no evidence of infection Integumentary (Hair, Skin) Wound #1 status is Open. Original cause of wound was Other Lesion. The date acquired was: 01/01/2018. The wound has been in treatment 16 weeks. The wound is located on the Right,Lateral Lower Leg. The wound measures 5cm length x 2.2cm width x 0.3cm depth; 8.639cm^2 area and 2.592cm^3 volume. There is Fat Layer (Subcutaneous Tissue) exposed. There is no tunneling or undermining noted. There is a medium amount of serosanguineous drainage noted. The wound margin is thickened. There is large (67-100%) red, pink granulation within the wound bed. There is a small (1-33%) amount of necrotic tissue within the wound bed. The periwound skin appearance exhibited: Hemosiderin Staining. The periwound skin appearance did not exhibit: Callus, Crepitus, Excoriation, Induration, Rash, Scarring, Dry/Scaly, Maceration, Atrophie Blanche, Cyanosis, Ecchymosis, Mottled, Pallor, Rubor, Erythema. Periwound temperature was noted as No Abnormality. 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 There was  a Double Layer Compression Therapy Procedure by Shawn Stall, RN. Post procedure Diagnosis Wound #1: Same as Pre-Procedure Plan Follow-up Appointments: Return Appointment in 1 week. - Dr. Leanord Hawking on Tuesday 05/28/2023  815am. Return Appointment in 2 weeks. - Dr. Leanord Hawking 08/03/2022 1100am Nurse Visit: - 06/11/2023 Tuesday Anesthetic: (In clinic) Topical Lidocaine 4% applied to wound bed Cellular or Tissue Based Products: Wound #1 Right,Lateral Lower Leg: Cellular or Tissue Based Product Type: - Apligraf #1 applied 02/28/23 Apligraf #2 applied 03/05/2023 Apligraf #3 applied 03/12/2023 Apligraf #4 applied 03/19/2023 Apligraf #5 applied on 03/26/2023 Other Cellular or Tissue Based Products Orders/Instructions: - RUN IVR FOR [place]-05/14/23- approved 100%- have to rerun on 06/16/2023 due to insurance renews. Bathing/ Shower/ Hygiene: May shower with protection but do not get wound dressing(s) wet. Protect dressing(s) with water repellant cover (for example, large plastic bag) or a cast cover and may then take shower. - Please do not get the legs wet. Please use cast protectors when showering. Cast protectors can be purchased from [employer], Medical supply store. Cost ranges from $17-$37 Off-Loading: Other: - Keep legs elevated to heart level or above when sitting. You may place a pillow behind the calf for comfort, if so desired. [name], [name] ([id number]) [id number].pdf Page 6 of 6 WOUND #1: - Lower Leg Wound Laterality: Right, Lateral Cleanser: Soap and Water 1 x Per Week/30 Days Discharge Instructions: May shower and wash wound with dial antibacterial soap and water prior to dressing change. Cleanser: Vashe 5.8 (oz) 1 x Per Week/30 Days Discharge Instructions: Cleanse the wound with Vashe prior to applying a clean dressing using gauze sponges, not tissue or cotton balls. Peri-Wound Care: Triamcinolone 15 (g) 1 x Per Week/30 Days Discharge Instructions: Use triamcinolone 15 (g) as directed Peri-Wound Care: Sween Lotion (Moisturizing lotion) 1 x Per Week/30 Days Discharge Instructions: Apply moisturizing lotion as directed Prim Dressing: Maxorb Extra Ag+ Alginate  Dressing, 2x2 (in/in) 1 x Per Week/30 Days ary Discharge Instructions: Apply to wound bed as instructed Secondary Dressing: Woven Gauze Sponge, Non-Sterile 4x4 in 1 x Per Week/30 Days Discharge Instructions: Apply over primary dressing as directed. Secondary Dressing: Zetuvit Plus 4x8 in 1 x Per Week/30 Days Discharge Instructions: Apply over primary dressing as directed. Com pression Wrap: Urgo K2 Lite, (equivalent to a 3 layer) two layer compression system, regular 1 x Per Week/30 Days Discharge Instructions: Apply Urgo K2 Lite as directed (alternative to 3 layer compression). Com pression Wrap: Stockinette 1 x Per Week/30 Days 1. For now I am going to change him to silver alginate based dressings under Urgo K2 lite 2. May need a PCR culture here next week. I am not sure why this is draining so much. He has good edema control Electronic Signature(s) Signed: 05/21/2023 4:32:14 PM By: [name] MD Entered By: [name] on 05/21/2023 11:05:46 -------------------------------------------------------------------------------- SuperBill Details Patient Name: Date of Service: [name] 05/21/2023 Medical Record Number: [id number] Patient Account Number: [id number] Date of Birth/Sex: Treating RN: [birth year] (52 y.o. [name] Primary Care Provider: Barbette Reichmann Other Clinician: Referring Provider: Treating Provider/Extender: Milas Kocher Weeks in Treatment: 16 Diagnosis Coding ICD-10 Codes Code Description [phone] Non-pressure chronic ulcer of other part of right lower leg with fat layer exposed L97.822 Non-pressure chronic ulcer of other part of left lower leg with fat layer exposed E11.622 Type 2 diabetes mellitus with other skin ulcer I87.313 Chronic venous hypertension (idiopathic) with ulcer of bilateral lower extremity Facility Procedures : CPT4 Code: [id number] Description: (Facility  Use Only) [id number] - APPLY MULTLAY COMPRS LWR  RT LEG Modifier: Quantity: 1 Physician Procedures : CPT4 Code Description Modifier [id number] - WC PHYS LEVEL 3 - EST PT ICD-10 Diagnosis Description L97.812 Non-pressure chronic ulcer of other part of right lower leg with fat layer exposed I87.313 Chronic venous hypertension (idiopathic) with ulcer  of bilateral lower extremity E11.622 Type 2 diabetes mellitus with other skin ulcer Quantity: 1 Electronic Signature(s) Signed: 05/21/2023 4:32:14 PM By: [name] MD Entered By: [name] on 05/21/2023 11:06:08

## 2023-05-24 NOTE — Progress Notes (Signed)
[name], [name] ([id number]) [phone].pdf Page 1 of 8 Visit Report for 05/21/2023 Arrival Information Details Patient Name: Date of Service: [name]. 05/21/2023 10:15 A M Medical Record Number: [id number] Patient Account Number: [id number] Date of Birth/Sex: Treating RN: 03-Mar-1971 (52 y.o. M) Primary Care Breindel Collier: Barbette Reichmann Other Clinician: Referring Mikele Sifuentes: Treating Johnnisha Forton/Extender: Imagene Sheller in Treatment: 16 Visit Information History Since Last Visit Added or deleted any medications: No Patient Arrived: Ambulatory Any new allergies or adverse reactions: No Arrival Time: 10:20 Had a fall or experienced change in No Accompanied By: self activities of daily living that may affect Transfer Assistance: None risk of falls: Patient Identification Verified: Yes Signs or symptoms of abuse/neglect since last visito No Secondary Verification Process Completed: Yes Hospitalized since last visit: No Patient Requires Transmission-Based Precautions: No Implantable device outside of the clinic excluding No Patient Has Alerts: No cellular tissue based products placed in the center since last visit: Has Dressing in Place as Prescribed: Yes Has Compression in Place as Prescribed: Yes Pain Present Now: No Electronic Signature(s) Signed: 05/24/2023 12:52:25 PM By: Thayer Dallas Entered By: Thayer Dallas on 05/21/2023 10:22:22 -------------------------------------------------------------------------------- Compression Therapy Details Patient Name: Date of Service: [name] [name] Bulla K. 05/21/2023 10:15 A M Medical Record Number: [id number] Patient Account Number: [id number] Date of Birth/Sex: Treating RN: 1970/08/21 (52 y.o. [name] Primary Care Amil Moseman: Barbette Reichmann Other Clinician: Referring Acxel Dingee: Treating Artice Holohan/Extender: Milas Kocher Weeks in Treatment:  16 Compression Therapy Performed for Wound Assessment: Wound #1 Right,Lateral Lower Leg Performed By: Clinician Shawn Stall, RN Compression Type: Double Layer Post Procedure Diagnosis Same as Pre-procedure Electronic Signature(s) Signed: 05/21/2023 12:20:53 PM By: Shawn Stall RN, BSN Entered By: Shawn Stall on 05/21/2023 10:51:51 [name] ([id number]) [id number] Page 2 of 8 -------------------------------------------------------------------------------- Encounter Discharge Information Details Patient Name: Date of Service: [name]. 05/21/2023 10:15 A M Medical Record Number: [id number] Patient Account Number: [id number] Date of Birth/Sex: Treating RN: May 09, 1971 (52 y.o. [name] Primary Care Noelani Harbach: Barbette Reichmann Other Clinician: Referring Zenab Gronewold: Treating Britain Saber/Extender: Imagene Sheller in Treatment: 16 Encounter Discharge Information Items Discharge Condition: Stable Ambulatory Status: Ambulatory Discharge Destination: Home Transportation: Private Auto Accompanied By: self Schedule Follow-up Appointment: Yes Clinical Summary of Care: Electronic Signature(s) Signed: 05/21/2023 12:20:53 PM By: Shawn Stall RN, BSN Entered By: Shawn Stall on 05/21/2023 10:55:54 -------------------------------------------------------------------------------- Lower Extremity Assessment Details Patient Name: Date of Service: [name] [name] Bulla K. 05/21/2023 10:15 A M Medical Record Number: [id number] Patient Account Number: [id number] Date of Birth/Sex: Treating RN: 1970/12/03 (52 y.o. M) Primary Care Ambri Miltner: Barbette Reichmann Other Clinician: Referring Kennith Morss: Treating Mercy Leppla/Extender: Milas Kocher Weeks in Treatment: 16 Edema Assessment Assessed: [Left: No] [Right: No] Edema: [Left: N] [Right: o] Calf Left: Right: Point of Measurement: 34 cm From Medial Instep  32.2 cm Ankle Left: Right: Point of Measurement: 10 cm From Medial Instep 22 cm Vascular Assessment Extremity colors, hair growth, and conditions: Hair Growth on Extremity: [Right:No] Temperature of Extremity: [Right:Warm] Capillary Refill: [Right:< 3 seconds] Dependent Rubor: [Right:No No] Electronic Signature(s) Signed: 05/24/2023 12:52:25 PM By: Thayer Dallas Entered By: Thayer Dallas on 05/21/2023 10:30:28 [name] ([id number]) [id number] Page 3 of 8 -------------------------------------------------------------------------------- Multi Wound Chart Details Patient Name: Date of Service: [name]. 05/21/2023 10:15 A M Medical Record Number: [id number] Patient Account Number: [id number] Date of Birth/Sex: Treating RN: [birth year] (52 y.o. M) Primary Care Cordie Beazley: Barbette Reichmann Other  Clinician: Referring Ilay Capshaw: Treating Arthea Nobel/Extender: Milas Kocher Weeks in Treatment: 16 Vital Signs Height(in): 70 Pulse(bpm): 64 Weight(lbs): 264 Blood Pressure(mmHg): 121/75 Body Mass Index(BMI): 37.9 Temperature(F): 97.5 Respiratory Rate(breaths/min): 18 [1:Photos:] [N/A:N/A] Right, Lateral Lower Leg N/A N/A Wound Location: Other Lesion N/A N/A Wounding Event: Diabetic Wound/Ulcer of the Lower N/A N/A Primary Etiology: Extremity Anemia, Deep Vein Thrombosis, N/A N/A Comorbid History: Hypertension, Peripheral Venous Disease, Type II Diabetes, Gout 01/01/2018 N/A N/A Date Acquired: 16 N/A N/A Weeks of Treatment: Open N/A N/A Wound Status: No N/A N/A Wound Recurrence: 5x2.2x0.3 N/A N/A Measurements L x W x D (cm) 8.639 N/A N/A A (cm) : rea 2.592 N/A N/A Volume (cm) : 33.90% N/A N/A % Reduction in A rea: 50.40% N/A N/A % Reduction in Volume: Grade 2 N/A N/A Classification: Medium N/A N/A Exudate A mount: Serosanguineous N/A N/A Exudate Type: red, brown N/A N/A Exudate Color: Thickened N/A  N/A Wound Margin: Large (67-100%) N/A N/A Granulation A mount: Red, Pink N/A N/A Granulation Quality: Small (1-33%) N/A N/A Necrotic A mount: Fat Layer (Subcutaneous Tissue): Yes N/A N/A Exposed Structures: Fascia: No Tendon: No Muscle: No Joint: No Bone: No Small (1-33%) N/A N/A Epithelialization: Excoriation: No N/A N/A Periwound Skin Texture: Induration: No Callus: No Crepitus: No Rash: No Scarring: No Maceration: No N/A N/A Periwound Skin Moisture: Dry/Scaly: No Hemosiderin Staining: Yes N/A N/A Periwound Skin Color: Atrophie Blanche: No Cyanosis: No Ecchymosis: No Erythema: No Mottled: No Pallor: No Rubor: No No Abnormality N/A N/A TemperatureKALIM, [name] ([id number]) [id number].pdf Page 4 of 8 Compression Therapy N/A N/A Procedures Performed: Treatment Notes Wound #1 (Lower Leg) Wound Laterality: Right, Lateral Cleanser Soap and Water Discharge Instruction: May shower and wash wound with dial antibacterial soap and water prior to dressing change. Vashe 5.8 (oz) Discharge Instruction: Cleanse the wound with Vashe prior to applying a clean dressing using gauze sponges, not tissue or cotton balls. Peri-Wound Care Triamcinolone 15 (g) Discharge Instruction: Use triamcinolone 15 (g) as directed Sween Lotion (Moisturizing lotion) Discharge Instruction: Apply moisturizing lotion as directed Topical Primary Dressing Maxorb Extra Ag+ Alginate Dressing, 2x2 (in/in) Discharge Instruction: Apply to wound bed as instructed Secondary Dressing Woven Gauze Sponge, Non-Sterile 4x4 in Discharge Instruction: Apply over primary dressing as directed. Zetuvit Plus 4x8 in Discharge Instruction: Apply over primary dressing as directed. Secured With Compression Wrap Urgo K2 Lite, (equivalent to a 3 layer) two layer compression system, regular Discharge Instruction: Apply Urgo K2 Lite as directed (alternative to 3 layer  compression). Stockinette Compression Stockings Facilities manager) Signed: 05/21/2023 4:32:14 PM By: [name] MD Entered By: [name] on 05/21/2023 11:01:28 -------------------------------------------------------------------------------- Multi-Disciplinary Care Plan Details Patient Name: Date of Service: [name] Fortes K. 05/21/2023 10:15 A M Medical Record Number: [id number] Patient Account Number: [id number] Date of Birth/Sex: Treating RN: 07/28/1970 (52 y.o. [name] Primary Care Shaneka Efaw: Barbette Reichmann Other Clinician: Referring Ivan Lacher: Treating Lilie Vezina/Extender: Milas Kocher Weeks in Treatment: 16 Active Inactive Wound/Skin Impairment Nursing Diagnoses: Impaired tissue integrity Goals: Patient/caregiver will verbalize understanding of skin care regimen [name], [name] ([id number]) [id number] Page 5 of 8 Date Initiated: 01/25/2023 Target Resolution Date: 06/14/2023 Goal Status: Active Interventions: Assess ulceration(s) every visit Treatment Activities: Skin care regimen initiated : 01/25/2023 Notes: Electronic Signature(s) Signed: 05/21/2023 12:20:53 PM By: Shawn Stall RN, BSN Entered By: Shawn Stall on 05/21/2023 10:26:17 -------------------------------------------------------------------------------- Pain Assessment Details Patient Name: Date of Service: [name] [name] Bulla K. 05/21/2023 10:15 A M Medical Record Number: [id number]  Patient Account Number: [id number] Date of Birth/Sex: Treating RN: 01/18/1971 (52 y.o. M) Primary Care Linh Johannes: Barbette Reichmann Other Clinician: Referring Natiya Seelinger: Treating Jowana Thumma/Extender: Milas Kocher Weeks in Treatment: 16 Active Problems Location of Pain Severity and Description of Pain Patient Has Paino No Site Locations Pain Management and Medication Current Pain Management: Electronic  Signature(s) Signed: 05/24/2023 12:52:25 PM By: Thayer Dallas Entered By: Thayer Dallas on 05/21/2023 10:30:16 -------------------------------------------------------------------------------- Patient/Caregiver Education Details Patient Name: Date of Service: [name] 11/5/2024andnbsp10:15 A M Medical Record Number: [id number] Patient Account Number: [id number] [name], [name] ([id number]) [phone].pdf Page 6 of 8 Date of Birth/Gender: Treating RN: April 01, 1971 (52 y.o. [name] Primary Care Physician: Barbette Reichmann Other Clinician: Referring Physician: Treating Physician/Extender: Imagene Sheller in Treatment: 16 Education Assessment Education Provided To: Patient Education Topics Provided Wound/Skin Impairment: Handouts: Caring for Your Ulcer Methods: Explain/Verbal Responses: Reinforcements needed Electronic Signature(s) Signed: 05/21/2023 12:20:53 PM By: Shawn Stall RN, BSN Entered By: Shawn Stall on 05/21/2023 10:26:30 -------------------------------------------------------------------------------- Wound Assessment Details Patient Name: Date of Service: [name] [name] Bulla K. 05/21/2023 10:15 A M Medical Record Number: [id number] Patient Account Number: [id number] Date of Birth/Sex: Treating RN: 1971-04-28 (52 y.o. 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 Weeks Of Treatment: 16 Comorbid Anemia, Deep Vein Thrombosis, Hypertension, Peripheral Venous Clustered Wound: No History: Disease, Type II Diabetes,  Gout Photos Wound Measurements Length: (cm) 5 Width: (cm) 2.2 Depth: (cm) 0.3 Area: (cm) 8.639 Volume: (cm) 2.592 % Reduction in Area: 33.9% % Reduction in Volume: 50.4% Epithelialization: Small (1-33%) Tunneling: No Undermining: No Wound Description Classification: Grade 2 Wound Margin: Thickened Exudate Amount: Medium [name], [name] ([id number]) Exudate Type: Serosanguineous Exudate Color: red, brown Foul Odor After Cleansing: No Slough/Fibrino Yes [id number].pdf Page 7 of 8 Wound Bed Granulation Amount: Large (67-100%) Exposed Structure Granulation Quality: Red, Pink Fascia Exposed: No Necrotic Amount: Small (1-33%) Fat Layer (Subcutaneous Tissue) Exposed: Yes Tendon Exposed: No Muscle Exposed: No Joint Exposed: No Bone Exposed: No Periwound Skin Texture Texture Color No Abnormalities Noted: No No Abnormalities Noted: No Callus: No Atrophie Blanche: No Crepitus: No Cyanosis: No Excoriation: No Ecchymosis: No Induration: No Erythema: No Rash: No Hemosiderin Staining: Yes Scarring: No Mottled: No Pallor: No Moisture Rubor: No No Abnormalities Noted: No Dry / Scaly: No Temperature / Pain Maceration: No Temperature: No Abnormality Treatment Notes Wound #1 (Lower Leg) Wound Laterality: Right, Lateral Cleanser Soap and Water Discharge Instruction: May shower and wash wound with dial antibacterial soap and water prior to dressing change. Vashe 5.8 (oz) Discharge Instruction: Cleanse the wound with Vashe prior to applying a clean dressing using gauze sponges, not tissue or cotton balls. Peri-Wound Care Triamcinolone 15 (g) Discharge Instruction: Use triamcinolone 15 (g) as directed Sween Lotion (Moisturizing lotion) Discharge Instruction: Apply moisturizing lotion as directed Topical Primary Dressing Maxorb Extra Ag+ Alginate Dressing, 2x2 (in/in) Discharge Instruction: Apply to wound bed as instructed Secondary  Dressing Woven Gauze Sponge, Non-Sterile 4x4 in Discharge Instruction: Apply over primary dressing as directed. Zetuvit Plus 4x8 in Discharge Instruction: Apply over primary dressing as directed. Secured With Compression Wrap Urgo K2 Lite, (equivalent to a 3 layer) two layer compression system, regular  Discharge Instruction: Apply Urgo K2 Lite as directed (alternative to 3 layer compression). Stockinette Compression Stockings Add-Ons Electronic Signature(s) Signed: 05/24/2023 12:52:25 PM By: Thayer Dallas Entered By: Thayer Dallas on 05/21/2023 10:39:59 [name] ([id number]) [id number] Page 8 of 8 -------------------------------------------------------------------------------- Vitals Details Patient Name: Date of Service: [name]. 05/21/2023 10:15 A M Medical Record Number: [id number] Patient Account Number: [id number] Date of Birth/Sex: Treating RN: 02/13/1971 (52 y.o. M) Primary Care Henrick Mcgue: Barbette Reichmann Other Clinician: Referring Gavriella Hearst: Treating Amila Callies/Extender: Milas Kocher Weeks in Treatment: 16 Vital Signs Time Taken: 10:22 Temperature (F): 97.5 Height (in): 70 Pulse (bpm): 64 Weight (lbs): 264 Respiratory Rate (breaths/min): 18 Body Mass Index (BMI): 37.9 Blood Pressure (mmHg): 121/75 Reference Range: 80 - 120 mg / dl Electronic Signature(s) Signed: 05/24/2023 12:52:25 PM By: Thayer Dallas Entered By: Thayer Dallas on 05/21/2023 10:30:09

## 2023-05-28 ENCOUNTER — Ambulatory Visit (HOSPITAL_BASED_OUTPATIENT_CLINIC_OR_DEPARTMENT_OTHER): Payer: Medicaid Other | Admitting: Internal Medicine

## 2023-05-29 DIAGNOSIS — R809 Proteinuria, unspecified: Secondary | ICD-10-CM | POA: Diagnosis not present

## 2023-05-29 DIAGNOSIS — E1165 Type 2 diabetes mellitus with hyperglycemia: Secondary | ICD-10-CM | POA: Diagnosis not present

## 2023-05-29 DIAGNOSIS — Z794 Long term (current) use of insulin: Secondary | ICD-10-CM | POA: Diagnosis not present

## 2023-05-29 DIAGNOSIS — D649 Anemia, unspecified: Secondary | ICD-10-CM | POA: Diagnosis not present

## 2023-05-29 DIAGNOSIS — L97909 Non-pressure chronic ulcer of unspecified part of unspecified lower leg with unspecified severity: Secondary | ICD-10-CM | POA: Diagnosis not present

## 2023-05-29 DIAGNOSIS — E1129 Type 2 diabetes mellitus with other diabetic kidney complication: Secondary | ICD-10-CM | POA: Diagnosis not present

## 2023-05-29 DIAGNOSIS — E11622 Type 2 diabetes mellitus with other skin ulcer: Secondary | ICD-10-CM | POA: Diagnosis not present

## 2023-05-29 DIAGNOSIS — N182 Chronic kidney disease, stage 2 (mild): Secondary | ICD-10-CM | POA: Diagnosis not present

## 2023-05-29 DIAGNOSIS — Z6838 Body mass index (BMI) 38.0-38.9, adult: Secondary | ICD-10-CM | POA: Diagnosis not present

## 2023-06-04 ENCOUNTER — Encounter (HOSPITAL_BASED_OUTPATIENT_CLINIC_OR_DEPARTMENT_OTHER): Payer: Medicaid Other | Admitting: Internal Medicine

## 2023-06-04 DIAGNOSIS — L97822 Non-pressure chronic ulcer of other part of left lower leg with fat layer exposed: Secondary | ICD-10-CM | POA: Diagnosis not present

## 2023-06-04 DIAGNOSIS — E11622 Type 2 diabetes mellitus with other skin ulcer: Secondary | ICD-10-CM | POA: Diagnosis not present

## 2023-06-04 DIAGNOSIS — L97812 Non-pressure chronic ulcer of other part of right lower leg with fat layer exposed: Secondary | ICD-10-CM | POA: Diagnosis not present

## 2023-06-04 DIAGNOSIS — Z86718 Personal history of other venous thrombosis and embolism: Secondary | ICD-10-CM | POA: Diagnosis not present

## 2023-06-04 DIAGNOSIS — Z794 Long term (current) use of insulin: Secondary | ICD-10-CM | POA: Diagnosis not present

## 2023-06-04 DIAGNOSIS — I87313 Chronic venous hypertension (idiopathic) with ulcer of bilateral lower extremity: Secondary | ICD-10-CM | POA: Diagnosis not present

## 2023-06-04 NOTE — Progress Notes (Signed)
[name], [name] ([id number]) [phone].pdf Page 1 of 8 Visit Report for 06/04/2023 Arrival Information Details Patient Name: Date of Service: [name] 06/04/2023 11:00 A M Medical Record Number: [id number] Patient Account Number: [id number] Date of Birth/Sex: Treating RN: 01/14/1971 (52 y.o. Charlean Merl, [name] Primary Care Modene Andy: Barbette Reichmann Other Clinician: Referring Whyatt Klinger: Treating Severin Bou/Extender: Imagene Sheller in Treatment: 18 Visit Information History Since Last Visit Added or deleted any medications: No Patient Arrived: Ambulatory Any new allergies or adverse reactions: No Arrival Time: 11:14 Had a fall or experienced change in No Accompanied By: self activities of daily living that may affect Transfer Assistance: None risk of falls: Patient Identification Verified: Yes Signs or symptoms of abuse/neglect since last visito No Secondary Verification Process Completed: Yes Hospitalized since last visit: No Patient Requires Transmission-Based Precautions: No Implantable device outside of the clinic excluding No Patient Has Alerts: No cellular tissue based products placed in the center since last visit: Has Dressing in Place as Prescribed: Yes Has Compression in Place as Prescribed: Yes Pain Present Now: No Electronic Signature(s) Signed: 06/04/2023 3:54:17 PM By: Fonnie Mu RN Entered By: Fonnie Mu on 06/04/2023 08:14:59 -------------------------------------------------------------------------------- Compression Therapy Details Patient Name: Date of Service: [name] 06/04/2023 11:00 A M Medical Record Number: [id number] Patient Account Number: [id number] Date of Birth/Sex: Treating RN: 1971-04-03 (52 y.o. [name] Primary Care Ramel Tobon: Barbette Reichmann Other Clinician: Referring Haiden Rawlinson: Treating Aurelia Gras/Extender: Milas Kocher Weeks in Treatment: 18 Compression Therapy Performed for Wound Assessment: Wound #1 Right,Lateral Lower Leg Performed By: Clinician Thayer Dallas, Compression Type: Double Layer Post Procedure Diagnosis Same as Pre-procedure Electronic Signature(s) Signed: 06/04/2023 4:47:01 PM By: Shawn Stall RN, BSN Entered By: Shawn Stall on 06/04/2023 08:26:41 [name] ([id number]) [id number] Page 2 of 8 -------------------------------------------------------------------------------- Encounter Discharge Information Details Patient Name: Date of Service: [name] 06/04/2023 11:00 A M Medical Record Number: [id number] Patient Account Number: [id number] Date of Birth/Sex: Treating RN: 1971-04-01 (53 y.o. [name] Primary Care Altagracia Rone: Barbette Reichmann Other Clinician: Referring Blanca Carreon: Treating Karyssa Amaral/Extender: Imagene Sheller in Treatment: 18 Encounter Discharge Information Items Discharge Condition: Stable Ambulatory Status: Ambulatory Discharge Destination: Home Transportation: Private Auto Accompanied By: self Schedule Follow-up Appointment: Yes Clinical Summary of Care: Electronic Signature(s) Signed: 06/04/2023 4:47:01 PM By: Shawn Stall RN, BSN Entered By: Shawn Stall on 06/04/2023 08:29:32 -------------------------------------------------------------------------------- Lower Extremity Assessment Details Patient Name: Date of Service: [name] 06/04/2023 11:00 A M Medical Record Number: [id number] Patient Account Number: [id number] Date of Birth/Sex: Treating RN: May 27, 1971 (52 y.o. [name] Primary Care Janyia Guion: Barbette Reichmann Other Clinician: Referring Deane Melick: Treating Brihana Quickel/Extender: Milas Kocher Weeks in Treatment: 18 Edema Assessment Assessed: [Left: No] Franne Forts: Yes] Edema: [Left: N] [Right: o] Calf Left:  Right: Point of Measurement: 34 cm From Medial Instep 32.2 cm Ankle Left: Right: Point of Measurement: 10 cm From Medial Instep 22 cm Vascular Assessment Pulses: Dorsalis Pedis Palpable: [Right:Yes] Posterior Tibial Palpable: [Right:Yes] Extremity colors, hair growth, and conditions: Hair Growth on Extremity: [Right:No] Temperature of Extremity: [Right:Warm] Capillary Refill: [Right:< 3 seconds] Dependent Rubor: [Right:No No] Electronic Signature(s) Signed: 06/04/2023 3:54:17 PM By: Fonnie Mu RN Entered By: Fonnie Mu on 06/04/2023 08:20:25 [name] ([id number]) [id number] Page 3 of 8 -------------------------------------------------------------------------------- Multi Wound Chart Details Patient Name: Date of Service: [name] 06/04/2023 11:00 A M Medical Record Number: [id number] Patient Account Number: [id number] Date  of Birth/Sex: Treating RN: 10/20/1970 (52 y.o. M) Primary Care Tyqwan Pink: Barbette Reichmann Other Clinician: Referring Rykar Lebleu: Treating Makael Stein/Extender: Milas Kocher Weeks in Treatment: 18 Vital Signs Height(in): 70 Pulse(bpm): 61 Weight(lbs): 264 Blood Pressure(mmHg): 115/72 Body Mass Index(BMI): 37.9 Temperature(F): 98.6 Respiratory Rate(breaths/min): 17 [1:Photos:] [N/A:N/A] Right, Lateral Lower Leg N/A N/A Wound Location: Other Lesion N/A N/A Wounding Event: Diabetic Wound/Ulcer of the Lower N/A N/A Primary Etiology: Extremity Anemia, Deep Vein Thrombosis, N/A N/A Comorbid History: Hypertension, Peripheral Venous Disease, Type II Diabetes, Gout 01/01/2018 N/A N/A Date Acquired: 18 N/A N/A Weeks of Treatment: Open N/A N/A Wound Status: No N/A N/A Wound Recurrence: 5x2x0.3 N/A N/A Measurements L x W x D (cm) 7.854 N/A N/A A (cm) : rea 2.356 N/A N/A Volume (cm) : 39.90% N/A N/A % Reduction in A rea: 54.90% N/A N/A % Reduction in Volume: Grade  2 N/A N/A Classification: Medium N/A N/A Exudate A mount: Serosanguineous N/A N/A Exudate Type: red, brown N/A N/A Exudate Color: Thickened N/A N/A Wound Margin: Large (67-100%) N/A N/A Granulation A mount: Red, Pink N/A N/A Granulation Quality: Small (1-33%) N/A N/A Necrotic A mount: Fat Layer (Subcutaneous Tissue): Yes N/A N/A Exposed Structures: Fascia: No Tendon: No Muscle: No Joint: No Bone: No Small (1-33%) N/A N/A Epithelialization: Excoriation: No N/A N/A Periwound Skin Texture: Induration: No Callus: No Crepitus: No Rash: No Scarring: No Maceration: No N/A N/A Periwound Skin Moisture: Dry/Scaly: No Hemosiderin Staining: Yes N/A N/A Periwound Skin Color: Atrophie Blanche: No Cyanosis: No Ecchymosis: No Erythema: No Mottled: No Pallor: No Rubor: No No Abnormality N/A N/A TemperatureSALVATORE, REINTS ([id number]) [id number] Page 4 of 8 Compression Therapy N/A N/A Procedures Performed: Treatment Notes Wound #1 (Lower Leg) Wound Laterality: Right, Lateral Cleanser Soap and Water Discharge Instruction: May shower and wash wound with dial antibacterial soap and water prior to dressing change. Vashe 5.8 (oz) Discharge Instruction: Cleanse the wound with Vashe prior to applying a clean dressing using gauze sponges, not tissue or cotton balls. Peri-Wound Care Triamcinolone 15 (g) Discharge Instruction: Use triamcinolone 15 (g) as directed Sween Lotion (Moisturizing lotion) Discharge Instruction: Apply moisturizing lotion as directed Topical Gentamicin Discharge Instruction: mix with mupirocin and apply directly to wound bed. Mupirocin Ointment Discharge Instruction: Apply Mupirocin mix gentamicin to wound bed. Primary Dressing Maxorb Extra Ag+ Alginate Dressing, 2x2 (in/in) Discharge Instruction: Apply to wound bed as instructed Secondary Dressing ABD Pad, 8x10 Discharge Instruction: Apply over primary dressing as  directed. CarboFLEX Odor Control Dressing, 4x4 in Discharge Instruction: Apply over primary dressing as directed. Woven Gauze Sponge, Non-Sterile 4x4 in Discharge Instruction: Apply over primary dressing as directed. Zetuvit Plus 4x8 in Discharge Instruction: Apply over primary dressing as directed. Secured With Compression Wrap Urgo K2 Lite, (equivalent to a 3 layer) two layer compression system, regular Discharge Instruction: Apply Urgo K2 Lite as directed (alternative to 3 layer compression). Stockinette Compression Stockings Facilities manager) Signed: 06/04/2023 4:19:19 PM By: [name] MD Entered By: [name] on 06/04/2023 08:33:33 -------------------------------------------------------------------------------- Multi-Disciplinary Care Plan Details Patient Name: Date of Service: [name] K. 06/04/2023 11:00 A M Medical Record Number: [id number] Patient Account Number: [id number] Date of Birth/Sex: Treating RN: 1970/12/14 (52 y.o. [name] Primary Care Kinley Ferrentino: Barbette Reichmann Other Clinician: Referring Caylor Cerino: Treating Renuka Farfan/Extender: Imagene Sheller in Treatment: [address], Jude[place] K ([id number]) [id number] Page 5 of 8 Active Inactive Wound/Skin Impairment Nursing Diagnoses: Impaired tissue integrity Goals: Patient/caregiver will verbalize understanding of skin care regimen Date Initiated:  01/25/2023 Target Resolution Date: 06/14/2023 Goal Status: Active Interventions: Assess ulceration(s) every visit Treatment Activities: Skin care regimen initiated : 01/25/2023 Notes: Electronic Signature(s) Signed: 06/04/2023 4:47:01 PM By: Shawn Stall RN, BSN Entered By: Shawn Stall on 06/04/2023 08:12:49 -------------------------------------------------------------------------------- Pain Assessment Details Patient Name: Date of Service: [name] 06/04/2023  11:00 A M Medical Record Number: [id number] Patient Account Number: [id number] Date of Birth/Sex: Treating RN: 1970/12/28 (52 y.o. [name] Primary Care Ayman Brull: Barbette Reichmann Other Clinician: Referring Louay Myrie: Treating Auri Jahnke/Extender: Milas Kocher Weeks in Treatment: 18 Active Problems Location of Pain Severity and Description of Pain Patient Has Paino No Site Locations Pain Management and Medication Current Pain Management: Electronic Signature(s) Signed: 06/04/2023 3:54:17 PM By: Fonnie Mu RN Entered By: Fonnie Mu on 06/04/2023 08:20:18 [name] ([id number]) [id number] Page 6 of 8 -------------------------------------------------------------------------------- Patient/Caregiver Education Details Patient Name: Date of Service: [name] 11/19/2024andnbsp11:00 A M Medical Record Number: [id number] Patient Account Number: [id number] Date of Birth/Gender: Treating RN: 1970-09-15 (52 y.o. [name] Primary Care Physician: Barbette Reichmann Other Clinician: Referring Physician: Treating Physician/Extender: Imagene Sheller in Treatment: 18 Education Assessment Education Provided To: Patient Education Topics Provided Wound/Skin Impairment: Handouts: Caring for Your Ulcer Methods: Explain/Verbal Responses: Reinforcements needed Electronic Signature(s) Signed: 06/04/2023 4:47:01 PM By: Shawn Stall RN, BSN Entered By: Shawn Stall on 06/04/2023 08:13:02 -------------------------------------------------------------------------------- Wound Assessment Details Patient Name: Date of Service: [name] 06/04/2023 11:00 A M Medical Record Number: [id number] Patient Account Number: [id number] Date of Birth/Sex: Treating RN: [birth year] (52 y.o. Charlean Merl, [name] Primary Care Shailene Demonbreun: Barbette Reichmann Other Clinician: Referring  Maliik Karner: Treating Lateef Juncaj/Extender: Milas Kocher Weeks in Treatment: 18 Wound Status Wound Number: 1 Primary Diabetic Wound/Ulcer of the Lower Extremity Etiology: Wound Location: Right, Lateral Lower Leg Wound Open Wounding Event: Other Lesion Status: Date Acquired: 01/01/2018 Notes: Pt. 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 Weeks Of Treatment: 18 Comorbid Anemia, Deep Vein Thrombosis, Hypertension, Peripheral Venous Clustered Wound: No History: Disease, Type II Diabetes, Gout Photos Wound Measurements Length: (cm) 5 Aune, Lavonne K ([id number]) Width: (cm) 2 Depth: (cm) 0 Area: (cm) Volume: (cm) % Reduction in Area: 39.9% [id number] Page 7 of 8 % Reduction in Volume: 54.9% .3 Epithelialization: Small (1-33%) 7.854 Tunneling: No 2.356 Undermining: No Wound Description Classification: Grade 2 Wound Margin: Thickened Exudate Amount: Medium Exudate Type: Serosanguineous Exudate Color: red, brown Foul Odor After Cleansing: No Slough/Fibrino Yes Wound Bed Granulation Amount: Large (67-100%) Exposed Structure Granulation Quality: Red, Pink Fascia Exposed: No Necrotic Amount: Small (1-33%) Fat Layer (Subcutaneous Tissue) Exposed: Yes Tendon Exposed: No Muscle Exposed: No Joint Exposed: No Bone Exposed: No Periwound Skin Texture Texture Color No Abnormalities Noted: No No Abnormalities Noted: No Callus: No Atrophie Blanche: No Crepitus: No Cyanosis: No Excoriation: No Ecchymosis: No Induration: No Erythema: No Rash: No Hemosiderin Staining: Yes Scarring: No Mottled: No Pallor: No Moisture Rubor: No No Abnormalities Noted: No Dry / Scaly: No Temperature / Pain Maceration: No Temperature: No Abnormality Treatment Notes Wound #1 (Lower Leg) Wound Laterality: Right, Lateral Cleanser Soap and Water Discharge Instruction: May shower and wash wound with dial antibacterial soap and water prior  to dressing change. Vashe 5.8 (oz) Discharge Instruction: Cleanse the wound with Vashe prior to applying a clean dressing using gauze sponges, not tissue or cotton balls. Peri-Wound Care Triamcinolone 15 (g) Discharge Instruction: Use triamcinolone 15 (g) as directed Sween Lotion (Moisturizing lotion) Discharge Instruction:  Apply moisturizing lotion as directed Topical Gentamicin Discharge Instruction: mix with mupirocin and apply directly to wound bed. Mupirocin Ointment Discharge Instruction: Apply Mupirocin mix gentamicin to wound bed. Primary Dressing Maxorb Extra Ag+ Alginate Dressing, 2x2 (in/in) Discharge Instruction: Apply to wound bed as instructed Secondary Dressing ABD Pad, 8x10 Discharge Instruction: Apply over primary dressing as directed. CarboFLEX Odor Control Dressing, 4x4 in Discharge Instruction: Apply over primary dressing as directed. Woven Gauze Sponge, Non-Sterile 4x4 in Discharge Instruction: Apply over primary dressing as directed. Zetuvit Plus 4x8 in Discharge Instruction: Apply over primary dressing as directed. TERMAINE, FORTUNO ([id number]) [phone].pdf Page 8 of 8 Secured With Compression Wrap Urgo K2 Lite, (equivalent to a 3 layer) two layer compression system, regular Discharge Instruction: Apply Urgo K2 Lite as directed (alternative to 3 layer compression). Stockinette Compression Stockings Facilities manager) Signed: 06/04/2023 3:54:17 PM By: Fonnie Mu RN Entered By: Fonnie Mu on 06/04/2023 08:21:59 -------------------------------------------------------------------------------- Vitals Details Patient Name: Date of Service: [name] 06/04/2023 11:00 A M Medical Record Number: [id number] Patient Account Number: [id number] Date of Birth/Sex: Treating RN: 08/08/1970 (52 y.o. Charlean Merl, [name] Primary Care Cherri Yera: Barbette Reichmann Other Clinician: Referring  Dary Dilauro: Treating Nema Oatley/Extender: Milas Kocher Weeks in Treatment: 18 Vital Signs Time Taken: 11:15 Temperature (F): 98.6 Height (in): 70 Pulse (bpm): 61 Weight (lbs): 264 Respiratory Rate (breaths/min): 17 Body Mass Index (BMI): 37.9 Blood Pressure (mmHg): 115/72 Reference Range: 80 - 120 mg / dl Electronic Signature(s) Signed: 06/04/2023 3:54:17 PM By: Fonnie Mu RN Entered By: Fonnie Mu on 06/04/2023 08:15:23

## 2023-06-04 NOTE — Progress Notes (Signed)
VIRAK, PONS ([id number]) [phone].pdf Page 1 of 7 Visit Report for 06/04/2023 HPI Details Patient Name: Date of Service: [name] 06/04/2023 11:00 A M Medical Record Number: [id number] Patient Account Number: [id number] Date of Birth/Sex: Treating RN: 1970-12-01 (52 y.o. M) Primary Care Provider: Barbette Reichmann Other Clinician: Referring Provider: Treating Provider/Extender: Milas Kocher Weeks in Treatment: 18 History of Present Illness HPI Description: 01/25/2023 [name] is a 52 year old male with a past medical history of controlled type 2 diabetes on insulin, venous insufficiency and DVT that presents to the clinic for bilateral lower extremity wounds. He states that the wound on the left was started by a bug bite and has been present for the past 4 months. The right lower extremity wound he states started out as a soft tissue infection and has been present for the past 6 years. He has been treated by a wound care center in [facility] in [place]. He was last seen in the wound care center 2 years ago Could not continue following up due to lack of transportation. He states that recently the rehab center has been wrapping his legs with compression wrap and a wound dressing underneath. It is unclear when the last time this happened was. He is not wearing any compression today. He currently denies signs of infection. 7/19; patient presents for follow-up. We have been using antibiotic ointment with Hydrofera Blue under 3 layer compression to the lower extremities bilaterally. Wounds are smaller. 7/30; patient presents for follow-up. We have been using antibiotic ointment and Hydrofera Blue under 3 layer compression to the lower extremities bilaterally. Wounds are smaller. 8/6; patient presents for follow-up. We have been using antibiotic ointment with Hydrofera Blue under 3 layer compression  to the lower extremities bilaterally. Wounds are stable. He denies signs of infection. 10/1; the patient has not been here for 3 weeks. He completed his course of Apligraf to the wound on the right lower extremity. Both legs were wrapped for 3 weeks. Fortunately the area on the left lower leg is healed however the right leg had debris on the surface and maceration but fortunately no evidence of infection. He reminds me that he had an extensive surgery on this area apparently several years ago by Dr. Lajoyce Corners for a "staph infection". He has compression stockings from elastic therapy in [place] he got last May he says they are relatively new he is hardly worn them. 8/15; patient presents for follow-up. We have been using collagen to the left lower extremity and PolyMem silver to the right lower extremity all under 3 layer compression. Wounds are slightly smaller today. Patient has been approved for Apligraf and he was agreeable to having this placed in office. 8/20; patient presents for follow-up. At last clinic visit we used Apligraf to the right lower extremity under 3 layer compression and collagen to the left lower extremity under 3 layer compression. Wounds are smaller. 8/27; Patient presents for follow-up. We have been using Apligraf to the right lower extremity wound and collagen to the left lower extremity wound under 3 layer compression. Overall wounds appear well-healing. He has no issues or complaints. 9/30; patient presents for follow-up. We have been using Apligraf to the right lower extremity wound and switch to PolyMem to the left lower extremity wound. All under 3 layer compression. Wounds are well-healing. 9/10; patient presents for follow-up. We have been using Apligraf to the right lower extremity wound and PolyMem silver to the left lower  extremity wound all under 3 layer compression. Wounds are smaller. 10/8; last week we changed the primary dressing on the large right lower leg wound  in the middle of scar tissue to Hydrofera Blue. He comes in today with a surface not looking particularly healthy. ALSO he has had a reopening in the area of the original left calf wound apparently happened while he was washing off the wound area. He is put polymen silver on this and his own compression stocking since 10/15; the area on the left posterior calf is healed. The large wound on the right in the middle lips scar tissue we changed to Iodoflex I believe last week. Surface looks a lot better 10/29; the patient was hospitalized last week for a cellulitis in the submandibular region on the left. He was in hospital from 10/19 through 10/22. He feels a lot better. He says they took his wrap off and put Mepilex on the wound. He got out a week ago and that is what he has been doing at home. The wound is on the right lateral calf the area on the left posterior calf is healed as it was last time. The wound is in the middle of his surgical incision for a staph infection 11/5; wound on the right lateral leg the area on the left posterior calf remains healed. This is likely chronic venous in etiology. He also had a lot of scar in this area from surgery had by Dr. Lajoyce Corners several years ago for staph infection. He had a run of Apligraf in September. We got approved for TheraSkin although he has a lot of drainage here I think we can make any skin substitute not a viable option at the moment 11/19; wound on the right lateral leg. This is likely chronic venous in etiology although he has had previous surgery in this area by Dr. Lajoyce Corners several years ago for staph infection. We had approval for TheraSkin although he for some reason he is having too much drainage to consider that. We have been using silver alginate. The wound is measuring smaller today with some rims of epithelialization but still moderate drainage Electronic Signature(s) Signed: 06/04/2023 4:19:19 PM By: [name] MD Entered By: [name] on 06/04/2023 08:34:41 [name] ([id number]) [id number] Page 2 of 7 -------------------------------------------------------------------------------- Physical Exam Details Patient Name: Date of Service: [name] 06/04/2023 11:00 A M Medical Record Number: [id number] Patient Account Number: [id number] Date of Birth/Sex: Treating RN: 06-Nov-1970 (52 y.o. M) Primary Care Provider: Barbette Reichmann Other Clinician: Referring Provider: Treating Provider/Extender: Milas Kocher Weeks in Treatment: 18 Constitutional Sitting or standing Blood Pressure is within target range for patient.. Pulse regular and within target range for patient.Marland Kitchen Respirations regular, non-labored and within target range.. Temperature is normal and within the target range for the patient.Marland Kitchen Appears in no distress. Notes Wound exam; right lateral lower leg in the middle of scar tissue. The patient has friable granulation but still has some additional drainage. There is does not appear to be obvious infection. He has had some epithelialization including closure of the distal part of the wound approximately 1 cm. No convincing evidence of coexistent soft tissue or periwound infection Electronic Signature(s) Signed: 06/04/2023 4:19:19 PM By: [name] MD Entered By: [name] on 06/04/2023 08:35:48 -------------------------------------------------------------------------------- Physician Orders Details Patient Name: Date of Service: [name] 06/04/2023 11:00 A M Medical Record Number: [id number] Patient Account Number: [id number] Date of Birth/Sex: Treating RN: Aug 02, 1970 (52  y.o. [name] Primary Care Provider: Barbette Reichmann Other Clinician: Referring Provider: Treating Provider/Extender: Imagene Sheller in Treatment: 18 The following information was scribed by: Shawn Stall The  information was scribed for: [name] Verbal / Phone Orders: No Diagnosis Coding ICD-10 Coding Code Description [phone] Non-pressure chronic ulcer of other part of right lower leg with fat layer exposed E11.622 Type 2 diabetes mellitus with other skin ulcer I87.313 Chronic venous hypertension (idiopathic) with ulcer of bilateral lower extremity Follow-up Appointments ppointment in 2 weeks. - Dr. Leanord Hawking (front office to schedule) Return A Nurse Visit: - 06/11/2023 Tuesday 1015 Anesthetic (In clinic) Topical Lidocaine 4% applied to wound bed Cellular or Tissue Based Products Wound #1 Right,Lateral Lower Leg Cellular or Tissue Based Product Type: - Apligraf #1 applied 02/28/23 Apligraf #2 applied 03/05/2023 Apligraf #3 applied 03/12/2023 Apligraf #4 applied 03/19/2023 Apligraf #5 applied on 03/26/2023 Other Cellular or Tissue Based Products Orders/Instructions: - RUN IVR FOR THER[place]IN-05/14/23- approved 100%- have to rerun on 06/16/2023 due to insurance renews. [address] [name], [name] ([id number]) [id number] Page 3 of 7 May shower with protection but do not get wound dressing(s) wet. Protect dressing(s) with water repellant cover (for example, large plastic bag) or a cast cover and may then take shower. - Please do not get the legs wet. Please use cast protectors when showering. Cast protectors can be purchased from [employer], Medical supply store. Cost ranges from $17-$37 Off-Loading Other: - Keep legs elevated to heart level or above when sitting. You may place a pillow behind the calf for comfort, if so desired. Wound Treatment Wound #1 - Lower Leg Wound Laterality: Right, Lateral Cleanser: Soap and Water 1 x Per Week/30 Days Discharge Instructions: May shower and wash wound with dial antibacterial soap and water prior to dressing change. Cleanser: Vashe 5.8 (oz) 1 x Per Week/30 Days Discharge Instructions: Cleanse  the wound with Vashe prior to applying a clean dressing using gauze sponges, not tissue or cotton balls. Peri-Wound Care: Triamcinolone 15 (g) 1 x Per Week/30 Days Discharge Instructions: Use triamcinolone 15 (g) as directed Peri-Wound Care: Sween Lotion (Moisturizing lotion) 1 x Per Week/30 Days Discharge Instructions: Apply moisturizing lotion as directed Topical: Gentamicin 1 x Per Week/30 Days Discharge Instructions: mix with mupirocin and apply directly to wound bed. Topical: Mupirocin Ointment 1 x Per Week/30 Days Discharge Instructions: Apply Mupirocin mix gentamicin to wound bed. Prim Dressing: Maxorb Extra Ag+ Alginate Dressing, 2x2 (in/in) 1 x Per Week/30 Days ary Discharge Instructions: Apply to wound bed as instructed Secondary Dressing: ABD Pad, 8x10 1 x Per Week/30 Days Discharge Instructions: Apply over primary dressing as directed. Secondary Dressing: CarboFLEX Odor Control Dressing, 4x4 in 1 x Per Week/30 Days Discharge Instructions: Apply over primary dressing as directed. Secondary Dressing: Woven Gauze Sponge, Non-Sterile 4x4 in 1 x Per Week/30 Days Discharge Instructions: Apply over primary dressing as directed. Secondary Dressing: Zetuvit Plus 4x8 in 1 x Per Week/30 Days Discharge Instructions: Apply over primary dressing as directed. Compression Wrap: Urgo K2 Lite, (equivalent to a 3 layer) two layer compression system, regular 1 x Per Week/30 Days Discharge Instructions: Apply Urgo K2 Lite as directed (alternative to 3 layer compression). Compression Wrap: Stockinette 1 x Per Week/30 Days Electronic Signature(s) Signed: 06/04/2023 4:19:19 PM By: [name] MD Signed: 06/04/2023 4:47:01 PM By: Shawn Stall RN, BSN Entered By: Shawn Stall on 06/04/2023 08:28:40 -------------------------------------------------------------------------------- Problem List Details Patient Name: Date of Service: [name] 06/04/2023 11:00 A M  Medical Record  Number: [id number] Patient Account Number: [id number] Date of Birth/Sex: Treating RN: [birth year] (52 y.o. [name] Primary Care Provider: Barbette Reichmann Other Clinician: Referring Provider: Treating Provider/Extender: Imagene Sheller in Treatment: 18 Active Problems ICD-10 Encounter Code Description Active Date MDM Diagnosis SALIH, WARWICK ([id number]) [id number] Page 4 of 7 [phone] Non-pressure chronic ulcer of other part of right lower leg with fat layer 01/25/2023 No Yes exposed E11.622 Type 2 diabetes mellitus with other skin ulcer 01/25/2023 No Yes I87.313 Chronic venous hypertension (idiopathic) with ulcer of bilateral lower extremity 01/25/2023 No Yes Inactive Problems ICD-10 Code Description Active Date Inactive Date L97.822 Non-pressure chronic ulcer of other part of left lower leg with fat layer exposed 01/25/2023 01/25/2023 Resolved Problems Electronic Signature(s) Signed: 06/04/2023 4:19:19 PM By: [name] MD Entered By: [name] on 06/04/2023 08:33:25 -------------------------------------------------------------------------------- Progress Note Details Patient Name: Date of Service: [name] 06/04/2023 11:00 A M Medical Record Number: [id number] Patient Account Number: [id number] Date of Birth/Sex: Treating RN: August 07, 1970 (52 y.o. M) Primary Care Provider: Barbette Reichmann Other Clinician: Referring Provider: Treating Provider/Extender: Milas Kocher Weeks in Treatment: 18 Subjective History of Present Illness (HPI) 01/25/2023 [name] is a 52 year old male with a past medical history of controlled type 2 diabetes on insulin, venous insufficiency and DVT that presents to the clinic for bilateral lower extremity wounds. He states that the wound on the left was started by a bug bite and has been present for the past 4 months. The right lower  extremity wound he states started out as a soft tissue infection and has been present for the past 6 years. He has been treated by a wound care center in [facility] in [place]. He was last seen in the wound care center 2 years ago Could not continue following up due to lack of transportation. He states that recently the rehab center has been wrapping his legs with compression wrap and a wound dressing underneath. It is unclear when the last time this happened was. He is not wearing any compression today. He currently denies signs of infection. 7/19; patient presents for follow-up. We have been using antibiotic ointment with Hydrofera Blue under 3 layer compression to the lower extremities bilaterally. Wounds are smaller. 7/30; patient presents for follow-up. We have been using antibiotic ointment and Hydrofera Blue under 3 layer compression to the lower extremities bilaterally. Wounds are smaller. 8/6; patient presents for follow-up. We have been using antibiotic ointment with Hydrofera Blue under 3 layer compression to the lower extremities bilaterally. Wounds are stable. He denies signs of infection. 10/1; the patient has not been here for 3 weeks. He completed his course of Apligraf to the wound on the right lower extremity. Both legs were wrapped for 3 weeks. Fortunately the area on the left lower leg is healed however the right leg had debris on the surface and maceration but fortunately no evidence of infection. He reminds me that he had an extensive surgery on this area apparently several years ago by Dr. Lajoyce Corners for a "staph infection". He has compression stockings from elastic therapy in [place] he got last May he says they are relatively new he is hardly worn them. 8/15; patient presents for follow-up. We have been using collagen to the left lower extremity and PolyMem silver to the right lower extremity all under 3 layer compression. Wounds are slightly smaller today.  Patient has been approved for Apligraf and he was  agreeable to having this placed in office. 8/20; patient presents for follow-up. At last clinic visit we used Apligraf to the right lower extremity under 3 layer compression and collagen to the left lower extremity under 3 layer compression. Wounds are smaller. 8/27; Patient presents for follow-up. We have been using Apligraf to the right lower extremity wound and collagen to the left lower extremity wound under 3 layer compression. Overall wounds appear well-healing. He has no issues or complaints. 9/30; patient presents for follow-up. We have been using Apligraf to the right lower extremity wound and switch to PolyMem to the left lower extremity wound. [name], Price ([id number]) [phone].pdf Page 5 of 7 All under 3 layer compression. Wounds are well-healing. 9/10; patient presents for follow-up. We have been using Apligraf to the right lower extremity wound and PolyMem silver to the left lower extremity wound all under 3 layer compression. Wounds are smaller. 10/8; last week we changed the primary dressing on the large right lower leg wound in the middle of scar tissue to Hydrofera Blue. He comes in today with a surface not looking particularly healthy. ALSO he has had a reopening in the area of the original left calf wound apparently happened while he was washing off the wound area. He is put polymen silver on this and his own compression stocking since 10/15; the area on the left posterior calf is healed. The large wound on the right in the middle lips scar tissue we changed to Iodoflex I believe last week. Surface looks a lot better 10/29; the patient was hospitalized last week for a cellulitis in the submandibular region on the left. He was in hospital from 10/19 through 10/22. He feels a lot better. He says they took his wrap off and put Mepilex on the wound. He got out a week ago and that is what he has been  doing at home. The wound is on the right lateral calf the area on the left posterior calf is healed as it was last time. The wound is in the middle of his surgical incision for a staph infection 11/5; wound on the right lateral leg the area on the left posterior calf remains healed. This is likely chronic venous in etiology. He also had a lot of scar in this area from surgery had by Dr. Lajoyce Corners several years ago for staph infection. He had a run of Apligraf in September. We got approved for TheraSkin although he has a lot of drainage here I think we can make any skin substitute not a viable option at the moment 11/19; wound on the right lateral leg. This is likely chronic venous in etiology although he has had previous surgery in this area by Dr. Lajoyce Corners several years ago for staph infection. We had approval for TheraSkin although he for some reason he is having too much drainage to consider that. We have been using silver alginate. The wound is measuring smaller today with some rims of epithelialization but still moderate drainage Objective Constitutional Sitting or standing Blood Pressure is within target range for patient.. Pulse regular and within target range for patient.Marland Kitchen Respirations regular, non-labored and within target range.. Temperature is normal and within the target range for the patient.Marland Kitchen Appears in no distress. Vitals Time Taken: 11:15 AM, Height: 70 in, Weight: 264 lbs, BMI: 37.9, Temperature: 98.6 F, Pulse: 61 bpm, Respiratory Rate: 17 breaths/min, Blood Pressure: 115/72 mmHg. General Notes: Wound exam; right lateral lower leg in the middle of scar tissue. The patient has  friable granulation but still has some additional drainage. There is does not appear to be obvious infection. He has had some epithelialization including closure of the distal part of the wound approximately 1 cm. No convincing evidence of coexistent soft tissue or periwound infection Integumentary (Hair, Skin) Wound  #1 status is Open. Original cause of wound was Other Lesion. The date acquired was: 01/01/2018. The wound has been in treatment 18 weeks. The wound is located on the Right,Lateral Lower Leg. The wound measures 5cm length x 2cm width x 0.3cm depth; 7.854cm^2 area and 2.356cm^3 volume. There is Fat Layer (Subcutaneous Tissue) exposed. There is no tunneling or undermining noted. There is a medium amount of serosanguineous drainage noted. The wound margin is thickened. There is large (67-100%) red, pink granulation within the wound bed. There is a small (1-33%) amount of necrotic tissue within the wound bed. The periwound skin appearance exhibited: Hemosiderin Staining. The periwound skin appearance did not exhibit: Callus, Crepitus, Excoriation, Induration, Rash, Scarring, Dry/Scaly, Maceration, Atrophie Blanche, Cyanosis, Ecchymosis, Mottled, Pallor, Rubor, Erythema. Periwound temperature was noted as No Abnormality. 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 There was a Double Layer Compression Therapy Procedure by Thayer Dallas. Post procedure Diagnosis Wound #1: Same as Pre-Procedure Plan [name], Price ([id number]) [phone].pdf Page 6 of 7 Follow-up Appointments: Return Appointment in 2 weeks. - Dr. Leanord Hawking (front office to schedule) Nurse Visit: - 06/11/2023 Tuesday 1015 Anesthetic: (In clinic) Topical Lidocaine 4% applied to wound bed Cellular or Tissue Based Products: Wound #1 Right,Lateral Lower Leg: Cellular or Tissue Based Product Type: - Apligraf #1 applied 02/28/23 Apligraf #2 applied 03/05/2023 Apligraf #3  applied 03/12/2023 Apligraf #4 applied 03/19/2023 Apligraf #5 applied on 03/26/2023 Other Cellular or Tissue Based Products Orders/Instructions: - RUN IVR FOR THERASKIN-05/14/23- approved 100%- have to rerun on 06/16/2023 due to insurance renews. Bathing/ Shower/ Hygiene: May shower with protection but do not get wound dressing(s) wet. Protect dressing(s) with water repellant cover (for example, large plastic bag) or a cast cover and may then take shower. - Please do not get the legs wet. Please use cast protectors when showering. Cast protectors can be purchased from [employer], Medical supply store. Cost ranges from $17-$37 Off-Loading: Other: - Keep legs elevated to heart level or above when sitting. You may place a pillow behind the calf for comfort, if so desired. WOUND #1: - Lower Leg Wound Laterality: Right, Lateral Cleanser: Soap and Water 1 x Per Week/30 Days Discharge Instructions: May shower and wash wound with dial antibacterial soap and water prior to dressing change. Cleanser: Vashe 5.8 (oz) 1 x Per Week/30 Days Discharge Instructions: Cleanse the wound with Vashe prior to applying a clean dressing using gauze sponges, not tissue or cotton balls. Peri-Wound Care: Triamcinolone 15 (g) 1 x Per Week/30 Days Discharge Instructions: Use triamcinolone 15 (g) as directed Peri-Wound Care: Sween Lotion (Moisturizing lotion) 1 x Per Week/30 Days Discharge Instructions: Apply moisturizing lotion as directed Topical: Gentamicin 1 x Per Week/30 Days Discharge Instructions: mix with mupirocin and apply directly to wound bed. Topical: Mupirocin Ointment 1 x Per Week/30 Days Discharge Instructions: Apply Mupirocin mix gentamicin to wound bed. Prim Dressing: Maxorb Extra Ag+ Alginate Dressing, 2x2 (in/in)  1 x Per Week/30 Days ary Discharge Instructions: Apply to wound bed as instructed Secondary Dressing: ABD Pad, 8x10 1 x Per Week/30 Days Discharge Instructions: Apply over  primary dressing as directed. Secondary Dressing: CarboFLEX Odor Control Dressing, 4x4 in 1 x Per Week/30 Days Discharge Instructions: Apply over primary dressing as directed. Secondary Dressing: Woven Gauze Sponge, Non-Sterile 4x4 in 1 x Per Week/30 Days Discharge Instructions: Apply over primary dressing as directed. Secondary Dressing: Zetuvit Plus 4x8 in 1 x Per Week/30 Days Discharge Instructions: Apply over primary dressing as directed. Com pression Wrap: Urgo K2 Lite, (equivalent to a 3 layer) two layer compression system, regular 1 x Per Week/30 Days Discharge Instructions: Apply Urgo K2 Lite as directed (alternative to 3 layer compression). Com pression Wrap: Stockinette 1 x Per Week/30 Days 1. I have decided to empirically add mupirocin and gentamicin under the silver alginate and still under compression 2. We have the option to do a PCR culture at a later date 3. I think there is still too much drainage to consider a skin substitute/[employer] Electronic Signature(s) Signed: 06/04/2023 4:19:19 PM By: [name] MD Entered By: [name] on 06/04/2023 08:36:34 -------------------------------------------------------------------------------- SuperBill Details Patient Name: Date of Service: [name] 06/04/2023 Medical Record Number: [id number] Patient Account Number: [id number] Date of Birth/Sex: Treating RN: 1970-08-27 (52 y.o. [name] Primary Care Provider: Barbette Reichmann Other Clinician: Referring Provider: Treating Provider/Extender: Milas Kocher Weeks in Treatment: 18 Diagnosis Coding ICD-10 Codes Code Description [phone] Non-pressure chronic ulcer of other part of right lower leg with fat layer exposed E11.622 Type 2 diabetes mellitus with other skin ulcer I87.313 Chronic venous hypertension (idiopathic) with ulcer of bilateral lower extremity Facility Procedures [name], Price ([id number]): CPT4 Code  Description [id number] (Facility Use Only) [phone] - APPLY MULTLAY COMPRS LWR RT LEG [id number] Page 7 of 7: Modifier Quantity 1 Physician Procedures : CPT4 Code Description Modifier [id number] - WC PHYS LEVEL 3 - EST PT ICD-10 Diagnosis Description L97.812 Non-pressure chronic ulcer of other part of right lower leg with fat layer exposed I87.313 Chronic venous hypertension (idiopathic) with ulcer  of bilateral lower extremity Quantity: 1 Electronic Signature(s) Signed: 06/04/2023 4:19:19 PM By: [name] MD Entered By: [name] on 06/04/2023 08:36:54

## 2023-06-05 DIAGNOSIS — E1129 Type 2 diabetes mellitus with other diabetic kidney complication: Secondary | ICD-10-CM | POA: Diagnosis not present

## 2023-06-05 DIAGNOSIS — Z794 Long term (current) use of insulin: Secondary | ICD-10-CM | POA: Diagnosis not present

## 2023-06-05 DIAGNOSIS — L97909 Non-pressure chronic ulcer of unspecified part of unspecified lower leg with unspecified severity: Secondary | ICD-10-CM | POA: Diagnosis not present

## 2023-06-05 DIAGNOSIS — Z1331 Encounter for screening for depression: Secondary | ICD-10-CM | POA: Diagnosis not present

## 2023-06-05 DIAGNOSIS — R809 Proteinuria, unspecified: Secondary | ICD-10-CM | POA: Diagnosis not present

## 2023-06-05 DIAGNOSIS — E11622 Type 2 diabetes mellitus with other skin ulcer: Secondary | ICD-10-CM | POA: Diagnosis not present

## 2023-06-05 DIAGNOSIS — I152 Hypertension secondary to endocrine disorders: Secondary | ICD-10-CM | POA: Diagnosis not present

## 2023-06-05 DIAGNOSIS — Z0000 Encounter for general adult medical examination without abnormal findings: Secondary | ICD-10-CM | POA: Diagnosis not present

## 2023-06-05 DIAGNOSIS — Z8739 Personal history of other diseases of the musculoskeletal system and connective tissue: Secondary | ICD-10-CM | POA: Diagnosis not present

## 2023-06-05 DIAGNOSIS — Z6835 Body mass index (BMI) 35.0-35.9, adult: Secondary | ICD-10-CM | POA: Diagnosis not present

## 2023-06-05 DIAGNOSIS — N1832 Chronic kidney disease, stage 3b: Secondary | ICD-10-CM | POA: Diagnosis not present

## 2023-06-05 DIAGNOSIS — E1159 Type 2 diabetes mellitus with other circulatory complications: Secondary | ICD-10-CM | POA: Diagnosis not present

## 2023-06-05 DIAGNOSIS — R519 Headache, unspecified: Secondary | ICD-10-CM | POA: Diagnosis not present

## 2023-06-11 ENCOUNTER — Encounter (HOSPITAL_BASED_OUTPATIENT_CLINIC_OR_DEPARTMENT_OTHER): Payer: Medicaid Other | Admitting: General Surgery

## 2023-06-11 DIAGNOSIS — Z86718 Personal history of other venous thrombosis and embolism: Secondary | ICD-10-CM | POA: Diagnosis not present

## 2023-06-11 DIAGNOSIS — E11622 Type 2 diabetes mellitus with other skin ulcer: Secondary | ICD-10-CM | POA: Diagnosis not present

## 2023-06-11 DIAGNOSIS — Z794 Long term (current) use of insulin: Secondary | ICD-10-CM | POA: Diagnosis not present

## 2023-06-11 DIAGNOSIS — L97812 Non-pressure chronic ulcer of other part of right lower leg with fat layer exposed: Secondary | ICD-10-CM | POA: Diagnosis not present

## 2023-06-11 DIAGNOSIS — L97822 Non-pressure chronic ulcer of other part of left lower leg with fat layer exposed: Secondary | ICD-10-CM | POA: Diagnosis not present

## 2023-06-11 DIAGNOSIS — I87313 Chronic venous hypertension (idiopathic) with ulcer of bilateral lower extremity: Secondary | ICD-10-CM | POA: Diagnosis not present

## 2023-06-11 NOTE — Progress Notes (Signed)
JARDON, HELLWEGE ([id number]) [id number] Page 1 of 1 Visit Report for 06/11/2023 SuperBill Details Patient Name: Date of Service: [name] 06/11/2023 Medical Record Number: [id number] Patient Account Number: [id number] Date of Birth/Sex: Treating RN: 26-Jul-1970 (52 y.o. M) Primary Care Provider: Barbette Reichmann Other Clinician: Referring Provider: Treating Provider/Extender: Filiberto Pinks, Vishwanath Weeks in Treatment: 19 Diagnosis Coding ICD-10 Codes Code Description [phone] Non-pressure chronic ulcer of other part of right lower leg with fat layer exposed E11.622 Type 2 diabetes mellitus with other skin ulcer I87.313 Chronic venous hypertension (idiopathic) with ulcer of bilateral lower extremity Facility Procedures CPT4 Code Description Modifier Quantity [id number] (Facility Use Only) [phone] - APPLY MULTLAY COMPRS LWR RT LEG 1 Electronic Signature(s) Signed: 06/11/2023 1:54:30 PM By: Duanne Guess MD FACS Signed: 06/11/2023 3:43:47 PM By: Thayer Dallas Entered By: Thayer Dallas on 06/11/2023 11:59:52

## 2023-06-11 NOTE — Progress Notes (Signed)
DEHAVEN, MERRELL ([id number]) [id number] Page 1 of 4 Visit Report for 06/11/2023 Arrival Information Details Patient Name: Date of Service: [name] 06/11/2023 10:15 A M Medical Record Number: [id number] Patient Account Number: [id number] Date of Birth/Sex: Treating RN: 1971/06/15 (52 y.o. M) Primary Care Ida Uppal: Barbette Reichmann Other Clinician: Referring Issis Lindseth: Treating Alfreida Steffenhagen/Extender: Egbert Garibaldi Weeks in Treatment: 19 Visit Information History Since Last Visit Added or deleted any medications: No Patient Arrived: Ambulatory Any new allergies or adverse reactions: No Arrival Time: 10:46 Had a fall or experienced change in No Accompanied By: self activities of daily living that may affect Transfer Assistance: None risk of falls: Patient Identification Verified: Yes Signs or symptoms of abuse/neglect since last visito No Secondary Verification Process Completed: Yes Hospitalized since last visit: No Patient Requires Transmission-Based Precautions: No Implantable device outside of the clinic excluding No Patient Has Alerts: No cellular tissue based products placed in the center since last visit: Has Dressing in Place as Prescribed: Yes Has Compression in Place as Prescribed: Yes Pain Present Now: No Electronic Signature(s) Signed: 06/11/2023 3:43:47 PM By: Thayer Dallas Entered By: Thayer Dallas on 06/11/2023 10:46:47 -------------------------------------------------------------------------------- Compression Therapy Details Patient Name: Date of Service: [name] Fortes K. 06/11/2023 10:15 A M Medical Record Number: [id number] Patient Account Number: [id number] Date of Birth/Sex: Treating RN: 1970-10-27 (52 y.o. M) Primary Care Manilla Strieter: Barbette Reichmann Other Clinician: Referring Susumu Hackler: Treating Kerolos Nehme/Extender: Filiberto Pinks, Vishwanath Weeks in Treatment: 19 Compression  Therapy Performed for Wound Assessment: Wound #1 Right,Lateral Lower Leg Performed By: Clinician Thayer Dallas, Compression Type: Double Layer Electronic Signature(s) Signed: 06/11/2023 3:43:47 PM By: Thayer Dallas Entered By: Thayer Dallas on 06/11/2023 10:49:31 -------------------------------------------------------------------------------- Encounter Discharge Information Details Patient Name: Date of Service: [name] 06/11/2023 10:15 A M Medical Record Number: [id number] Patient Account Number: [id number] [name], [name] ([id number]) [id number] Page 2 of 4 Date of Birth/Sex: Treating RN: July 10, 1971 (52 y.o. M) Primary Care Aaliyana Fredericks: Barbette Reichmann Other Clinician: Thayer Dallas Referring Thelbert Gartin: Treating Kooper Chriswell/Extender: Egbert Garibaldi Weeks in Treatment: 85 Encounter Discharge Information Items Discharge Condition: Stable Ambulatory Status: Ambulatory Discharge Destination: Home Transportation: Private Auto Accompanied By: self Schedule Follow-up Appointment: Yes Clinical Summary of Care: Electronic Signature(s) Signed: 06/11/2023 3:43:47 PM By: Thayer Dallas Entered By: Thayer Dallas on 06/11/2023 11:59:39 -------------------------------------------------------------------------------- Patient/Caregiver Education Details Patient Name: Date of Service: [name] 11/26/2024andnbsp10:15 A M Medical Record Number: [id number] Patient Account Number: [id number] Date of Birth/Gender: Treating RN: 1971/06/16 (52 y.o. M) Primary Care Physician: Barbette Reichmann Other Clinician: Thayer Dallas Referring Physician: Treating Physician/Extender: Ernst Breach in Treatment: 58 Education Assessment Education Provided To: Patient Education Topics Provided Electronic Signature(s) Signed: 06/11/2023 3:43:47 PM By: Thayer Dallas Entered By: Thayer Dallas on  06/11/2023 11:59:24 -------------------------------------------------------------------------------- Wound Assessment Details Patient Name: Date of Service: [name] 06/11/2023 10:15 A M Medical Record Number: [id number] Patient Account Number: [id number] Date of Birth/Sex: Treating RN: [birth year] (52 y.o. 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 Date Acquired: 01/01/2018 Weeks Of Treatment: 19 Clustered Wound: No Wound Measurements Guhl, Valerio K ([id number]) Length: (cm) 5 Width: (cm) 2 Depth: (cm) 0.3 Area: (cm) 7.854  Volume: (cm) 2.356 [id number] Page 3 of 4 % Reduction in Area: 39.9% % Reduction in Volume: 54.9% Wound Description Classification: Grade 2 Exudate Amount: Medium Exudate Type: Serosanguineous Exudate Color: red, brown Periwound Skin Texture Texture Color No Abnormalities Noted: No No Abnormalities Noted: No Moisture No Abnormalities Noted: No Treatment Notes Wound #1 (Lower Leg) Wound Laterality: Right, Lateral Cleanser Soap and Water Discharge Instruction: May shower and wash wound with dial antibacterial soap and water prior to dressing change. Vashe 5.8 (oz) Discharge Instruction: Cleanse the wound with Vashe prior to applying a clean dressing using gauze sponges, not tissue or cotton balls. Peri-Wound Care Triamcinolone 15 (g) Discharge Instruction: Use triamcinolone 15 (g) as directed Sween Lotion (Moisturizing lotion) Discharge Instruction: Apply moisturizing lotion as directed Topical Gentamicin Discharge Instruction: mix with  mupirocin and apply directly to wound bed. Mupirocin Ointment Discharge Instruction: Apply Mupirocin mix gentamicin to wound bed. Primary Dressing Maxorb Extra Ag+ Alginate Dressing, 2x2 (in/in) Discharge Instruction: Apply to wound bed as instructed Secondary Dressing ABD Pad, 8x10 Discharge Instruction: Apply over primary dressing as directed. CarboFLEX Odor Control Dressing, 4x4 in Discharge Instruction: Apply over primary dressing as directed. Woven Gauze Sponge, Non-Sterile 4x4 in Discharge Instruction: Apply over primary dressing as directed. Zetuvit Plus 4x8 in Discharge Instruction: Apply over primary dressing as directed. Secured With Compression Wrap Urgo K2 Lite, (equivalent to a 3 layer) two layer compression system, regular Discharge Instruction: Apply Urgo K2 Lite as directed (alternative to 3 layer compression). Stockinette Compression Stockings Facilities manager) Signed: 06/11/2023 3:43:47 PM By: Thayer Dallas Entered By: Thayer Dallas on 06/11/2023 10:47:02 [name] ([id number]) [id number] Page 4 of 4 -------------------------------------------------------------------------------- Vitals Details Patient Name: Date of Service: [name] 06/11/2023 10:15 A M Medical Record Number: [id number] Patient Account Number: [id number] Date of Birth/Sex: Treating RN: 05-20-1971 (52 y.o. M) Primary Care Keyetta Hollingworth: Barbette Reichmann Other Clinician: Referring Saidah Kempton: Treating Aviyah Swetz/Extender: Filiberto Pinks, Vishwanath Weeks in Treatment: 19 Vital Signs Time Taken: 10:46 Reference Range: 80 - 120 mg / dl Height (in): 70 Weight (lbs): 264 Body Mass Index (BMI): 37.9 Electronic Signature(s) Signed: 06/11/2023 3:43:47 PM By: Thayer Dallas Entered By: Thayer Dallas on 06/11/2023 10:46:54

## 2023-06-20 ENCOUNTER — Encounter (HOSPITAL_BASED_OUTPATIENT_CLINIC_OR_DEPARTMENT_OTHER): Payer: Medicaid Other | Attending: Internal Medicine | Admitting: Internal Medicine

## 2023-06-20 DIAGNOSIS — I10 Essential (primary) hypertension: Secondary | ICD-10-CM | POA: Insufficient documentation

## 2023-06-20 DIAGNOSIS — Z86718 Personal history of other venous thrombosis and embolism: Secondary | ICD-10-CM | POA: Insufficient documentation

## 2023-06-20 DIAGNOSIS — E11622 Type 2 diabetes mellitus with other skin ulcer: Secondary | ICD-10-CM | POA: Insufficient documentation

## 2023-06-20 DIAGNOSIS — Z794 Long term (current) use of insulin: Secondary | ICD-10-CM | POA: Diagnosis not present

## 2023-06-20 DIAGNOSIS — I872 Venous insufficiency (chronic) (peripheral): Secondary | ICD-10-CM | POA: Insufficient documentation

## 2023-06-20 DIAGNOSIS — L97812 Non-pressure chronic ulcer of other part of right lower leg with fat layer exposed: Secondary | ICD-10-CM | POA: Diagnosis not present

## 2023-06-20 DIAGNOSIS — E11621 Type 2 diabetes mellitus with foot ulcer: Secondary | ICD-10-CM | POA: Diagnosis not present

## 2023-06-20 DIAGNOSIS — M109 Gout, unspecified: Secondary | ICD-10-CM | POA: Insufficient documentation

## 2023-06-21 NOTE — Progress Notes (Signed)
[name], [name] ([id number]) [id number] Page 1 of 8 Visit Report for 06/20/2023 Arrival Information Details Patient Name: Date of Service: [name] [name] 06/20/2023 10:45 A M Medical Record Number: [id number] Patient Account Number: [id number] Date of Birth/Sex: Treating RN: Dec 05, 1970 (52 y.o. M) Primary Care Adisynn Suleiman: Barbette Reichmann Other Clinician: Referring Dmetrius Ambs: Treating Jalil Lorusso/Extender: Milas Kocher Weeks in Treatment: 20 Visit Information History Since Last Visit Added or deleted any medications: No Patient Arrived: Ambulatory Any new allergies or adverse reactions: No Arrival Time: 10:51 Had a fall or experienced change in No Accompanied By: self activities of daily living that may affect Transfer Assistance: None risk of falls: Patient Identification Verified: Yes Signs or symptoms of abuse/neglect since last visito No Secondary Verification Process Completed: Yes Hospitalized since last visit: No Patient Requires Transmission-Based Precautions: No Implantable device outside of the clinic excluding No Patient Has Alerts: No cellular tissue based products placed in the center since last visit: Pain Present Now: No Electronic Signature(s) Signed: 06/20/2023 4:00:37 PM By: Dayton Scrape Entered By: Dayton Scrape on 06/20/2023 07:51:45 -------------------------------------------------------------------------------- Compression Therapy Details Patient Name: Date of Service: [name] [name] 06/20/2023 10:45 A M Medical Record Number: [id number] Patient Account Number: [id number] Date of Birth/Sex: Treating RN: [birth year] (52 y.o. Marlan Palau Primary Care Aunesti Pellegrino: Barbette Reichmann Other Clinician: Referring Kobyn Kray: Treating Hobert Poplaski/Extender: Milas Kocher Weeks in Treatment: 20 Compression Therapy Performed for Wound Assessment: Wound #1 Right,Lateral Lower Leg Performed  By: Clinician Samuella Bruin, RN Compression Type: Double Layer Post Procedure Diagnosis Same as Pre-procedure Electronic Signature(s) Signed: 06/20/2023 4:50:46 PM By: Gelene Mink By: Samuella Bruin on 06/20/2023 08:56:05 Encounter Discharge Information Details -------------------------------------------------------------------------------- [name] ([id number]) [id number] Page 2 of 8 Patient Name: Date of Service: [name] [name] 06/20/2023 10:45 A M Medical Record Number: [id number] Patient Account Number: [id number] Date of Birth/Sex: Treating RN: 1970/09/20 (52 y.o. Marlan Palau Primary Care Jamani Eley: Barbette Reichmann Other Clinician: Referring Missy Baksh: Treating Mozell Hardacre/Extender: Imagene Sheller in Treatment: 20 Encounter Discharge Information Items Discharge Condition: Stable Ambulatory Status: Ambulatory Discharge Destination: Home Transportation: Private Auto Accompanied By: self Schedule Follow-up Appointment: Yes Clinical Summary of Care: Patient Declined Electronic Signature(s) Signed: 06/20/2023 4:50:46 PM By: Gelene Mink By: Samuella Bruin on 06/20/2023 08:56:45 -------------------------------------------------------------------------------- Lower Extremity Assessment Details Patient Name: Date of Service: [name] [name] 06/20/2023 10:45 A M Medical Record Number: [id number] Patient Account Number: [id number] Date of Birth/Sex: Treating RN: 02/27/1971 (52 y.o. Marlan Palau Primary Care Wynton Hufstetler: Barbette Reichmann Other Clinician: Referring Braxxton Stoudt: Treating Rosslyn Pasion/Extender: Milas Kocher Weeks in Treatment: 20 Edema Assessment Assessed: [Left: No] [Right: No] Edema: [Left: N] [Right: o] Calf Left: Right: Point of Measurement: 34 cm From Medial Instep 31.5 cm Ankle Left: Right: Point of Measurement: 10  cm From Medial Instep 23.5 cm Vascular Assessment Pulses: Dorsalis Pedis Palpable: [Right:Yes] Extremity colors, hair growth, and conditions: Hair Growth on Extremity: [Right:No] Temperature of Extremity: [Right:Warm] Capillary Refill: [Right:< 3 seconds] Dependent Rubor: [Right:No No] Electronic Signature(s) Signed: 06/20/2023 4:50:46 PM By: Samuella Bruin Entered By: Samuella Bruin on 06/20/2023 08:15:14 [name] ([id number]) [id number] Page 3 of 8 -------------------------------------------------------------------------------- Multi Wound Chart Details Patient Name: Date of Service: [name] [name] 06/20/2023 10:45 A M Medical Record Number: [id number] Patient Account Number: [id number] Date of Birth/Sex: Treating RN: 1971-03-03 (52 y.o. M) Primary Care Jezabel Lecker: Barbette Reichmann Other Clinician: Referring Aysa Larivee: Treating Ej Pinson/Extender: Felton Clinton, Roderic Palau  Weeks in Treatment: 20 Vital Signs Height(in): 70 Pulse(bpm): 67 Weight(lbs): 264 Blood Pressure(mmHg): 127/79 Body Mass Index(BMI): 37.9 Temperature(F): 98.1 Respiratory Rate(breaths/min): 18 [1:Photos:] [N/A:N/A] Right, Lateral Lower Leg N/A N/A Wound Location: Other Lesion N/A N/A Wounding Event: Diabetic Wound/Ulcer of the Lower N/A N/A Primary Etiology: Extremity Anemia, Deep Vein Thrombosis, N/A N/A Comorbid History: Hypertension, Peripheral Venous Disease, Type II Diabetes, Gout 01/01/2018 N/A N/A Date Acquired: 20 N/A N/A Weeks of Treatment: Open N/A N/A Wound Status: No N/A N/A Wound Recurrence: 4.5x1.9x0.2 N/A N/A Measurements L x W x D (cm) 6.715 N/A N/A A (cm) : rea 1.343 N/A N/A Volume (cm) : 48.60% N/A N/A % Reduction in A rea: 74.30% N/A N/A % Reduction in Volume: Grade 2 N/A N/A Classification: Medium N/A N/A Exudate A mount: Serosanguineous N/A N/A Exudate Type: red, brown N/A N/A Exudate Color: Distinct,  outline attached N/A N/A Wound Margin: Large (67-100%) N/A N/A Granulation A mount: Red N/A N/A Granulation Quality: Small (1-33%) N/A N/A Necrotic A mount: Fat Layer (Subcutaneous Tissue): Yes N/A N/A Exposed Structures: Fascia: No Tendon: No Muscle: No Joint: No Bone: No Medium (34-66%) N/A N/A Epithelialization: Scarring: Yes N/A N/A Periwound Skin Texture: No Abnormalities Noted N/A N/A Periwound Skin Moisture: Hemosiderin Staining: Yes N/A N/A Periwound Skin Color: No Abnormality N/A N/A Temperature: Compression Therapy N/A N/A Procedures Performed: Treatment Notes Wound #1 (Lower Leg) Wound Laterality: Right, Lateral Cleanser Soap and Water Discharge Instruction: May shower and wash wound with dial antibacterial soap and water prior to dressing change. Vashe 5.8 (oz) [name], [name] ([id number]) [id number] Page 4 of 8 Discharge Instruction: Cleanse the wound with Vashe prior to applying a clean dressing using gauze sponges, not tissue or cotton balls. Peri-Wound Care Triamcinolone 15 (g) Discharge Instruction: Use triamcinolone 15 (g) as directed Sween Lotion (Moisturizing lotion) Discharge Instruction: Apply moisturizing lotion as directed Topical Gentamicin Discharge Instruction: mix with mupirocin and apply directly to wound bed. Mupirocin Ointment Discharge Instruction: Apply Mupirocin mix gentamicin to wound bed. Primary Dressing Maxorb Extra Ag+ Alginate Dressing, 2x2 (in/in) Discharge Instruction: Apply to wound bed as instructed Secondary Dressing ABD Pad, 8x10 Discharge Instruction: Apply over primary dressing as directed. CarboFLEX Odor Control Dressing, 4x4 in Discharge Instruction: Apply over primary dressing as directed. Woven Gauze Sponge, Non-Sterile 4x4 in Discharge Instruction: Apply over primary dressing as directed. Zetuvit Plus 4x8 in Discharge Instruction: Apply over primary dressing as directed. Secured  With Compression Wrap Urgo K2 Lite, (equivalent to a 3 layer) two layer compression system, regular Discharge Instruction: Apply Urgo K2 Lite as directed (alternative to 3 layer compression). Stockinette Compression Stockings Facilities manager) Signed: 06/20/2023 5:41:20 PM By: [name] MD Entered By: [name] on 06/20/2023 09:40:03 -------------------------------------------------------------------------------- Multi-Disciplinary Care Plan Details Patient Name: Date of Service: [name] Fortes Price. 06/20/2023 10:45 A M Medical Record Number: [id number] Patient Account Number: [id number] Date of Birth/Sex: Treating RN: [birth year] (52 y.o. Marlan Palau Primary Care Ryott Rafferty: Barbette Reichmann Other Clinician: Referring Alishia Lebo: Treating Landry Lookingbill/Extender: Milas Kocher Weeks in Treatment: 20 Active Inactive Wound/Skin Impairment Nursing Diagnoses: Impaired tissue integrity Goals: Patient/caregiver will verbalize understanding of skin care regimen Date Initiated: 01/25/2023 Target Resolution Date: 07/12/2023 TATE, BUCKALEW ([id number]) [id number] Page 5 of 8 Goal Status: Active Interventions: Assess ulceration(s) every visit Treatment Activities: Skin care regimen initiated : 01/25/2023 Notes: Electronic Signature(s) Signed: 06/20/2023 4:50:46 PM By: Samuella Bruin Entered By: Samuella Bruin on 06/20/2023 08:17:08 -------------------------------------------------------------------------------- Pain Assessment Details Patient Name: Date of Service: [name]  [name]. 06/20/2023 10:45 A M Medical Record Number: [id number] Patient Account Number: [id number] Date of Birth/Sex: Treating RN: January 11, 1971 (52 y.o. M) Primary Care Nancyjo Givhan: Barbette Reichmann Other Clinician: Referring [name]: Treating Vegas Fritze/Extender: Milas Kocher Weeks in Treatment: 20 Active  Problems Location of Pain Severity and Description of Pain Patient Has Paino No Site Locations Pain Management and Medication Current Pain Management: Electronic Signature(s) Signed: 06/20/2023 4:00:37 PM By: Dayton Scrape Entered By: Dayton Scrape on 06/20/2023 07:52:12 -------------------------------------------------------------------------------- Patient/Caregiver Education Details Patient Name: Date of Service: [name] [name] 12/5/2024andnbsp10:45 A M Medical Record Number: [id number] Patient Account Number: [id number] Date of Birth/Gender: Treating RN: [birth year] (52 y.o. [name], [name], [name] ([id number]) [id number] Page 6 of 8 Primary Care Physician: Barbette Reichmann Other Clinician: Referring Physician: Treating Physician/Extender: Imagene Sheller in Treatment: 20 Education Assessment Education Provided To: Patient Education Topics Provided Wound/Skin Impairment: Methods: Explain/Verbal Responses: Reinforcements needed, State content correctly Electronic Signature(s) Signed: 06/20/2023 4:50:46 PM By: Samuella Bruin Entered By: Samuella Bruin on 06/20/2023 08:17:17 -------------------------------------------------------------------------------- Wound Assessment Details Patient Name: Date of Service: [name] [name]. 06/20/2023 10:45 A M Medical Record Number: [id number] Patient Account Number: [id number] Date of Birth/Sex: Treating RN: September 12, 1970 (52 y.o. M) Primary Care Aquil Duhe: Barbette Reichmann Other Clinician: Referring Annalea Alguire: Treating Odena Mcquaid/Extender: Milas Kocher Weeks in Treatment: 20 Wound Status Wound Number: 1 Primary Diabetic Wound/Ulcer of the Lower Extremity Etiology: Wound Location: Right, Lateral Lower Leg Wound Open Wounding Event: Other Lesion Status: Date Acquired: 01/01/2018 Notes: Pt. 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  2019 Weeks Of Treatment: 20 Comorbid Anemia, Deep Vein Thrombosis, Hypertension, Peripheral Venous Clustered Wound: No History: Disease, Type II Diabetes, Gout Photos Wound Measurements Length: (cm) 4.5 Width: (cm) 1.9 Depth: (cm) 0.2 Area: (cm) 6.715 Volume: (cm) 1.343 % Reduction in Area: 48.6% % Reduction in Volume: 74.3% Epithelialization: Medium (34-66%) Tunneling: No Undermining: No Wound Description Classification: Grade 2 Wound Margin: Distinct, outline attached Exudate Amount: Medium Exudate Type: Serosanguineous Exudate Color: red, brown [name], [name] ([id number]) Foul Odor After Cleansing: No Slough/Fibrino Yes [id number] Page 7 of 8 Wound Bed Granulation Amount: Large (67-100%) Exposed Structure Granulation Quality: Red Fascia Exposed: No Necrotic Amount: Small (1-33%) Fat Layer (Subcutaneous Tissue) Exposed: Yes Necrotic Quality: Adherent Slough Tendon Exposed: No Muscle Exposed: No Joint Exposed: No Bone Exposed: No Periwound Skin Texture Texture Color No Abnormalities Noted: No No Abnormalities Noted: No Scarring: Yes Hemosiderin Staining: Yes Moisture Temperature / Pain No Abnormalities Noted: Yes Temperature: No Abnormality Treatment Notes Wound #1 (Lower Leg) Wound Laterality: Right, Lateral Cleanser Soap and Water Discharge Instruction: May shower and wash wound with dial antibacterial soap and water prior to dressing change. Vashe 5.8 (oz) Discharge Instruction: Cleanse the wound with Vashe prior to applying a clean dressing using gauze sponges, not tissue or cotton balls. Peri-Wound Care Triamcinolone 15 (g) Discharge Instruction: Use triamcinolone 15 (g) as directed Sween Lotion (Moisturizing lotion) Discharge Instruction: Apply moisturizing lotion as directed Topical Gentamicin Discharge Instruction: mix with mupirocin and apply directly to wound bed. Mupirocin Ointment Discharge Instruction: Apply  Mupirocin mix gentamicin to wound bed. Primary Dressing Maxorb Extra Ag+ Alginate Dressing, 2x2 (in/in) Discharge Instruction: Apply to wound bed as instructed Secondary Dressing ABD Pad, 8x10 Discharge Instruction: Apply over primary dressing as directed. CarboFLEX Odor Control Dressing, 4x4 in Discharge Instruction: Apply over primary dressing as directed. Woven Gauze Sponge, Non-Sterile 4x4 in Discharge Instruction: Apply over primary dressing as  directed. Zetuvit Plus 4x8 in Discharge Instruction: Apply over primary dressing as directed. Secured With Compression Wrap Urgo K2 Lite, (equivalent to a 3 layer) two layer compression system, regular Discharge Instruction: Apply Urgo K2 Lite as directed (alternative to 3 layer compression). Stockinette Compression Stockings Facilities manager) Signed: 06/20/2023 4:50:46 PM By: Samuella Bruin Entered By: Samuella Bruin on 06/20/2023 08:13:50 [name] ([id number]) [id number] Page 8 of 8 -------------------------------------------------------------------------------- Vitals Details Patient Name: Date of Service: [name] [name] 06/20/2023 10:45 A M Medical Record Number: [id number] Patient Account Number: [id number] Date of Birth/Sex: Treating RN: [birth year] (52 y.o. M) Primary Care Delcia Spitzley: Barbette Reichmann Other Clinician: Referring [name]: Treating Nykia Turko/Extender: Milas Kocher Weeks in Treatment: 20 Vital Signs Time Taken: 10:51 Temperature (F): 98.1 Height (in): 70 Pulse (bpm): 67 Weight (lbs): 264 Respiratory Rate (breaths/min): 18 Body Mass Index (BMI): 37.9 Blood Pressure (mmHg): 127/79 Reference Range: 80 - 120 mg / dl Electronic Signature(s) Signed: 06/20/2023 4:00:37 PM By: Dayton Scrape Entered By: Dayton Scrape on 06/20/2023 07:52:06

## 2023-06-21 NOTE — Progress Notes (Signed)
[name], [name] ([id number]) [id number] Page 1 of 7 Visit Report for 06/20/2023 HPI Details Patient Name: Date of Service: [name] 06/20/2023 10:45 A M Medical Record Number: [id number] Patient Account Number: [id number] Date of Birth/Sex: Treating RN: 05/18/1971 (52 y.o. M) Primary Care Provider: Barbette Reichmann Other Clinician: Referring Provider: Treating Provider/Extender: Milas Kocher Weeks in Treatment: 20 History of Present Illness HPI Description: 01/25/2023 Mr. Kaikoa Chea is a 52 year old male with a past medical history of controlled type 2 diabetes on insulin, venous insufficiency and DVT that presents to the clinic for bilateral lower extremity wounds. He states that the wound on the left was started by a bug bite and has been present for the past 4 months. The right lower extremity wound he states started out as a soft tissue infection and has been present for the past 6 years. He has been treated by a wound care center in [facility] in [place]. He was last seen in the wound care center 2 years ago Could not continue following up due to lack of transportation. He states that recently the rehab center has been wrapping his legs with compression wrap and a wound dressing underneath. It is unclear when the last time this happened was. He is not wearing any compression today. He currently denies signs of infection. 7/19; patient presents for follow-up. We have been using antibiotic ointment with Hydrofera Blue under 3 layer compression to the lower extremities bilaterally. Wounds are smaller. 7/30; patient presents for follow-up. We have been using antibiotic ointment and Hydrofera Blue under 3 layer compression to the lower extremities bilaterally. Wounds are smaller. 8/6; patient presents for follow-up. We have been using antibiotic ointment with Hydrofera Blue under 3 layer compression to  the lower extremities bilaterally. Wounds are stable. He denies signs of infection. 10/1; the patient has not been here for 3 weeks. He completed his course of Apligraf to the wound on the right lower extremity. Both legs were wrapped for 3 weeks. Fortunately the area on the left lower leg is healed however the right leg had debris on the surface and maceration but fortunately no evidence of infection. He reminds me that he had an extensive surgery on this area apparently several years ago by Dr. Lajoyce Corners for a "staph infection". He has compression stockings from elastic therapy in [place] he got last May he says they are relatively new he is hardly worn them. 8/15; patient presents for follow-up. We have been using collagen to the left lower extremity and PolyMem silver to the right lower extremity all under 3 layer compression. Wounds are slightly smaller today. Patient has been approved for Apligraf and he was agreeable to having this placed in office. 8/20; patient presents for follow-up. At last clinic visit we used Apligraf to the right lower extremity under 3 layer compression and collagen to the left lower extremity under 3 layer compression. Wounds are smaller. 8/27; Patient presents for follow-up. We have been using Apligraf to the right lower extremity wound and collagen to the left lower extremity wound under 3 layer compression. Overall wounds appear well-healing. He has no issues or complaints. 9/30; patient presents for follow-up. We have been using Apligraf to the right lower extremity wound and switch to PolyMem to the left lower extremity wound. All under 3 layer compression. Wounds are well-healing. 9/10; patient presents for follow-up. We have been using Apligraf to the right lower extremity wound and PolyMem silver to the left lower  extremity wound all under 3 layer compression. Wounds are smaller. 10/8; last week we changed the primary dressing on the large right lower leg wound in  the middle of scar tissue to Hydrofera Blue. He comes in today with a surface not looking particularly healthy. ALSO he has had a reopening in the area of the original left calf wound apparently happened while he was washing off the wound area. He is put polymen silver on this and his own compression stocking since 10/15; the area on the left posterior calf is healed. The large wound on the right in the middle lips scar tissue we changed to Iodoflex I believe last week. Surface looks a lot better 10/29; the patient was hospitalized last week for a cellulitis in the submandibular region on the left. He was in hospital from 10/19 through 10/22. He feels a lot better. He says they took his wrap off and put Mepilex on the wound. He got out a week ago and that is what he has been doing at home. The wound is on the right lateral calf the area on the left posterior calf is healed as it was last time. The wound is in the middle of his surgical incision for a staph infection 11/5; wound on the right lateral leg the area on the left posterior calf remains healed. This is likely chronic venous in etiology. He also had a lot of scar in this area from surgery had by Dr. Lajoyce Corners several years ago for staph infection. He had a run of Apligraf in September. We got approved for TheraSkin although he has a lot of drainage here I think we can make any skin substitute not a viable option at the moment 11/19; wound on the right lateral leg. This is likely chronic venous in etiology although he has had previous surgery in this area by Dr. Lajoyce Corners several years ago for staph infection. We had approval for TheraSkin although he for some reason he is having too much drainage to consider that. We have been using silver alginate. The wound is measuring smaller today with some rims of epithelialization but still moderate drainage 12/5; right lateral leg. This is a area that has had previous surgery for an infection. It is likely  chronic venous. We have had approval for TheraSkin although there is still quite a bit of drainage here. We are using gent Mupin silver alginate. Measurements are slightly smaller Electronic Signature(s) Signed: 06/20/2023 5:41:20 PM By: [name] MD Entered By: [name] on 06/20/2023 12:41:49 [name] ([id number]) [id number] Page 2 of 7 -------------------------------------------------------------------------------- Physical Exam Details Patient Name: Date of Service: [name] 06/20/2023 10:45 A M Medical Record Number: [id number] Patient Account Number: [id number] Date of Birth/Sex: Treating RN: [birth year] (52 y.o. M) Primary Care Provider: Barbette Reichmann Other Clinician: Referring Provider: Treating Provider/Extender: Milas Kocher Weeks in Treatment: 20 Constitutional Sitting or standing Blood Pressure is within target range for patient.. Pulse regular and within target range for patient.Marland Kitchen Respirations regular, non-labored and within target range.. Temperature is normal and within the target range for the patient.Marland Kitchen Appears in no distress. Notes Wound exam; right lateral lower leg in the middle of scar tissue. Granulation is somewhat friable and some drainage. There does not appear to be obvious infection. The wound is measuring slightly smaller. No debridement is necessary Electronic Signature(s) Signed: 06/20/2023 5:41:20 PM By: [name] MD Entered By: [name] on 06/20/2023 12:42:28 -------------------------------------------------------------------------------- Physician Orders Details Patient Name: Date  of Service: [name] 06/20/2023 10:45 A M Medical Record Number: [id number] Patient Account Number: [id number] Date of Birth/Sex: Treating RN: 25-Oct-1970 (52 y.o. Marlan Palau Primary Care Provider: Barbette Reichmann Other Clinician: Referring  Provider: Treating Provider/Extender: Imagene Sheller in Treatment: 20 The following information was scribed by: Samuella Bruin The information was scribed for: [name] Verbal / Phone Orders: No Diagnosis Coding Follow-up Appointments ppointment in 1 week. - Dr. Leanord Hawking Return A Anesthetic (In clinic) Topical Lidocaine 4% applied to wound bed Cellular or Tissue Based Products Wound #1 Right,Lateral Lower Leg Cellular or Tissue Based Product Type: - Apligraf #1 applied 02/28/23 Apligraf #2 applied 03/05/2023 Apligraf #3 applied 03/12/2023 Apligraf #4 applied 03/19/2023 Apligraf #5 applied on 03/26/2023 Other Cellular or Tissue Based Products Orders/Instructions: - RUN IVR FOR THERASKIN-05/14/23- approved 100%- have to rerun on 06/16/2023 due to insurance renews. Bathing/ Shower/ Hygiene May shower with protection but do not get wound dressing(s) wet. Protect dressing(s) with water repellant cover (for example, large plastic bag) or a cast cover and may then take shower. - Please do not get the legs wet. Please use cast protectors when showering. Cast protectors can be purchased from [employer], Medical supply store. Cost ranges from $17-$37 Off-Loading Other: - Keep legs elevated to heart level or above when sitting. You may place a pillow behind the calf for comfort, if so desired. Wound Treatment DESHAY, MEDD ([id number]) [phone].pdf Page 3 of 7 Wound #1 - Lower Leg Wound Laterality: Right, Lateral Cleanser: Soap and Water 1 x Per Week/30 Days Discharge Instructions: May shower and wash wound with dial antibacterial soap and water prior to dressing change. Cleanser: Vashe 5.8 (oz) 1 x Per Week/30 Days Discharge Instructions: Cleanse the wound with Vashe prior to applying a clean dressing using gauze sponges, not tissue or cotton balls. Peri-Wound Care: Triamcinolone 15 (g) 1 x Per Week/30  Days Discharge Instructions: Use triamcinolone 15 (g) as directed Peri-Wound Care: Sween Lotion (Moisturizing lotion) 1 x Per Week/30 Days Discharge Instructions: Apply moisturizing lotion as directed Topical: Gentamicin 1 x Per Week/30 Days Discharge Instructions: mix with mupirocin and apply directly to wound bed. Topical: Mupirocin Ointment 1 x Per Week/30 Days Discharge Instructions: Apply Mupirocin mix gentamicin to wound bed. Prim Dressing: Maxorb Extra Ag+ Alginate Dressing, 2x2 (in/in) 1 x Per Week/30 Days ary Discharge Instructions: Apply to wound bed as instructed Secondary Dressing: ABD Pad, 8x10 1 x Per Week/30 Days Discharge Instructions: Apply over primary dressing as directed. Secondary Dressing: CarboFLEX Odor Control Dressing, 4x4 in 1 x Per Week/30 Days Discharge Instructions: Apply over primary dressing as directed. Secondary Dressing: Woven Gauze Sponge, Non-Sterile 4x4 in 1 x Per Week/30 Days Discharge Instructions: Apply over primary dressing as directed. Secondary Dressing: Zetuvit Plus 4x8 in 1 x Per Week/30 Days Discharge Instructions: Apply over primary dressing as directed. Compression Wrap: Urgo K2 Lite, (equivalent to a 3 layer) two layer compression system, regular 1 x Per Week/30 Days Discharge Instructions: Apply Urgo K2 Lite as directed (alternative to 3 layer compression). Compression Wrap: Stockinette 1 x Per Week/30 Days Patient Medications llergies: No Known Drug Allergies A Notifications Medication Indication Start End 06/20/2023 lidocaine DOSE topical 4 % cream - cream topical Electronic Signature(s) Signed: 06/20/2023 4:50:46 PM By: Samuella Bruin Signed: 06/20/2023 5:41:20 PM By: [name] MD Entered By: Samuella Bruin on 06/20/2023 11:18:16 -------------------------------------------------------------------------------- Problem List Details Patient Name: Date of Service: [name] 06/20/2023 10:45 A M Medical Record  Number: [id number] Patient Account Number: [id number] Date of Birth/Sex: Treating RN: Sep 24, 1970 (52 y.o. M) Primary Care Provider: Barbette Reichmann Other Clinician: Referring Provider: Treating Provider/Extender: Imagene Sheller in Treatment: [address] Problems ICD-10 KRISTON, BOKER ([id number]) [id number] Page 4 of 7 Encounter Code Description Active Date MDM Diagnosis L97.812 Non-pressure chronic ulcer of other part of right lower leg with fat layer 01/25/2023 No Yes exposed E11.622 Type 2 diabetes mellitus with other skin ulcer 01/25/2023 No Yes I87.313 Chronic venous hypertension (idiopathic) with ulcer of bilateral lower extremity 01/25/2023 No Yes Inactive Problems ICD-10 Code Description Active Date Inactive Date L97.822 Non-pressure chronic ulcer of other part of left lower leg with fat layer exposed 01/25/2023 01/25/2023 Resolved Problems Electronic Signature(s) Signed: 06/20/2023 5:41:20 PM By: [name] MD Entered By: [name] on 06/20/2023 12:38:40 -------------------------------------------------------------------------------- Progress Note Details Patient Name: Date of Service: [name] 06/20/2023 10:45 A M Medical Record Number: [id number] Patient Account Number: [id number] Date of Birth/Sex: Treating RN: June 04, 1971 (52 y.o. M) Primary Care Provider: Barbette Reichmann Other Clinician: Referring Provider: Treating Provider/Extender: Milas Kocher Weeks in Treatment: 20 Subjective History of Present Illness (HPI) 01/25/2023 Mr. Anvit Pasqual is a 52 year old male with a past medical history of controlled type 2 diabetes on insulin, venous insufficiency and DVT that presents to the clinic for bilateral lower extremity wounds. He states that the wound on the left was started by a bug bite and has been present for the past 4 months. The right lower extremity  wound he states started out as a soft tissue infection and has been present for the past 6 years. He has been treated by a wound care center in [facility] in [place]. He was last seen in the wound care center 2 years ago Could not continue following up due to lack of transportation. He states that recently the rehab center has been wrapping his legs with compression wrap and a wound dressing underneath. It is unclear when the last time this happened was. He is not wearing any compression today. He currently denies signs of infection. 7/19; patient presents for follow-up. We have been using antibiotic ointment with Hydrofera Blue under 3 layer compression to the lower extremities bilaterally. Wounds are smaller. 7/30; patient presents for follow-up. We have been using antibiotic ointment and Hydrofera Blue under 3 layer compression to the lower extremities bilaterally. Wounds are smaller. 8/6; patient presents for follow-up. We have been using antibiotic ointment with Hydrofera Blue under 3 layer compression to the lower extremities bilaterally. Wounds are stable. He denies signs of infection. 10/1; the patient has not been here for 3 weeks. He completed his course of Apligraf to the wound on the right lower extremity. Both legs were wrapped for 3 weeks. Fortunately the area on the left lower leg is healed however the right leg had debris on the surface and maceration but fortunately no evidence of infection. He reminds me that he had an extensive surgery on this area apparently several years ago by Dr. Lajoyce Corners for a "staph infection". He has compression stockings from elastic therapy in [place] he got last May he says they are relatively new he is hardly worn them. 8/15; patient presents for follow-up. We have been using collagen to the left lower extremity and PolyMem silver to the right lower extremity all under 3 layer compression. Wounds are slightly smaller today. Patient has  been approved for Apligraf and he was agreeable to having this  placed in office. 8/20; patient presents for follow-up. At last clinic visit we used Apligraf to the right lower extremity under 3 layer compression and collagen to the left lower extremity under 3 layer compression. Wounds are smaller. 8/27; Patient presents for follow-up. We have been using Apligraf to the right lower extremity wound and collagen to the left lower extremity wound under 3 layer compression. Overall wounds appear well-healing. He has no issues or complaints. COURTNEY, REIFSNYDER ([id number]) [id number] Page 5 of 7 9/30; patient presents for follow-up. We have been using Apligraf to the right lower extremity wound and switch to PolyMem to the left lower extremity wound. All under 3 layer compression. Wounds are well-healing. 9/10; patient presents for follow-up. We have been using Apligraf to the right lower extremity wound and PolyMem silver to the left lower extremity wound all under 3 layer compression. Wounds are smaller. 10/8; last week we changed the primary dressing on the large right lower leg wound in the middle of scar tissue to Hydrofera Blue. He comes in today with a surface not looking particularly healthy. ALSO he has had a reopening in the area of the original left calf wound apparently happened while he was washing off the wound area. He is put polymen silver on this and his own compression stocking since 10/15; the area on the left posterior calf is healed. The large wound on the right in the middle lips scar tissue we changed to Iodoflex I believe last week. Surface looks a lot better 10/29; the patient was hospitalized last week for a cellulitis in the submandibular region on the left. He was in hospital from 10/19 through 10/22. He feels a lot better. He says they took his wrap off and put Mepilex on the wound. He got out a week ago and that is what he has been doing at home.  The wound is on the right lateral calf the area on the left posterior calf is healed as it was last time. The wound is in the middle of his surgical incision for a staph infection 11/5; wound on the right lateral leg the area on the left posterior calf remains healed. This is likely chronic venous in etiology. He also had a lot of scar in this area from surgery had by Dr. Lajoyce Corners several years ago for staph infection. He had a run of Apligraf in September. We got approved for TheraSkin although he has a lot of drainage here I think we can make any skin substitute not a viable option at the moment 11/19; wound on the right lateral leg. This is likely chronic venous in etiology although he has had previous surgery in this area by Dr. Lajoyce Corners several years ago for staph infection. We had approval for TheraSkin although he for some reason he is having too much drainage to consider that. We have been using silver alginate. The wound is measuring smaller today with some rims of epithelialization but still moderate drainage 12/5; right lateral leg. This is a area that has had previous surgery for an infection. It is likely chronic venous. We have had approval for TheraSkin although there is still quite a bit of drainage here. We are using gent Mupin silver alginate. Measurements are slightly smaller Objective Constitutional Sitting or standing Blood Pressure is within target range for patient.. Pulse regular and within target range for patient.Marland Kitchen Respirations regular, non-labored and within target range.. Temperature is normal and within the target range for the patient.Marland Kitchen Appears in no distress.  Vitals Time Taken: 10:51 AM, Height: 70 in, Weight: 264 lbs, BMI: 37.9, Temperature: 98.1 F, Pulse: 67 bpm, Respiratory Rate: 18 breaths/min, Blood Pressure: 127/79 mmHg. General Notes: Wound exam; right lateral lower leg in the middle of scar tissue. Granulation is somewhat friable and some drainage. There does not appear  to be obvious infection. The wound is measuring slightly smaller. No debridement is necessary Integumentary (Hair, Skin) Wound #1 status is Open. Original cause of wound was Other Lesion. The date acquired was: 01/01/2018. The wound has been in treatment 20 weeks. The wound is located on the Right,Lateral Lower Leg. The wound measures 4.5cm length x 1.9cm width x 0.2cm depth; 6.715cm^2 area and 1.343cm^3 volume. There is Fat Layer (Subcutaneous Tissue) exposed. There is no tunneling or undermining noted. There is a medium amount of serosanguineous drainage noted. The wound margin is distinct with the outline attached to the wound base. There is large (67-100%) red granulation within the wound bed. There is a small (1-33%) amount of necrotic tissue within the wound bed including Adherent Slough. The periwound skin appearance had no abnormalities noted for moisture. The periwound skin appearance exhibited: Scarring, Hemosiderin Staining. Periwound temperature was noted as No Abnormality. 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 There was a Double Layer Compression Therapy Procedure by Burt Ek, RN. Post procedure Diagnosis Wound #1: Same as Pre-Procedure BARDIA, SARANTOS ([id number]) [id number] Page 6 of 7 Plan Follow-up Appointments: Return Appointment in 1 week. - Dr. Leanord Hawking Anesthetic: (In clinic) Topical Lidocaine 4% applied to wound bed Cellular or Tissue Based Products: Wound #1 Right,Lateral Lower Leg: Cellular or Tissue Based Product Type: - Apligraf #1 applied 02/28/23 Apligraf #2 applied 03/05/2023  Apligraf #3 applied 03/12/2023 Apligraf #4 applied 03/19/2023 Apligraf #5 applied on 03/26/2023 Other Cellular or Tissue Based Products Orders/Instructions: - RUN IVR FOR THERASKIN-05/14/23- approved 100%- have to rerun on 06/16/2023 due to insurance renews. Bathing/ Shower/ Hygiene: May shower with protection but do not get wound dressing(s) wet. Protect dressing(s) with water repellant cover (for example, large plastic bag) or a cast cover and may then take shower. - Please do not get the legs wet. Please use cast protectors when showering. Cast protectors can be purchased from [employer], Medical supply store. Cost ranges from $17-$37 Off-Loading: Other: - Keep legs elevated to heart level or above when sitting. You may place a pillow behind the calf for comfort, if so desired. The following medication(s) was prescribed: lidocaine topical 4 % cream cream topical was prescribed at facility WOUND #1: - Lower Leg Wound Laterality: Right, Lateral Cleanser: Soap and Water 1 x Per Week/30 Days Discharge Instructions: May shower and wash wound with dial antibacterial soap and water prior to dressing change. Cleanser: Vashe 5.8 (oz) 1 x Per Week/30 Days Discharge Instructions: Cleanse the wound with Vashe prior to applying a clean dressing using gauze sponges, not tissue or cotton balls. Peri-Wound Care: Triamcinolone 15 (g) 1 x Per Week/30 Days Discharge Instructions: Use triamcinolone 15 (g) as directed Peri-Wound Care: Sween Lotion (Moisturizing lotion) 1 x Per Week/30 Days Discharge Instructions: Apply moisturizing lotion as directed Topical: Gentamicin 1 x Per Week/30 Days Discharge Instructions: mix with mupirocin and apply directly to  wound bed. Topical: Mupirocin Ointment 1 x Per Week/30 Days Discharge Instructions: Apply Mupirocin mix gentamicin to wound bed. Prim Dressing: Maxorb Extra Ag+ Alginate Dressing, 2x2 (in/in) 1 x Per Week/30 Days ary Discharge Instructions:  Apply to wound bed as instructed Secondary Dressing: ABD Pad, 8x10 1 x Per Week/30 Days Discharge Instructions: Apply over primary dressing as directed. Secondary Dressing: CarboFLEX Odor Control Dressing, 4x4 in 1 x Per Week/30 Days Discharge Instructions: Apply over primary dressing as directed. Secondary Dressing: Woven Gauze Sponge, Non-Sterile 4x4 in 1 x Per Week/30 Days Discharge Instructions: Apply over primary dressing as directed. Secondary Dressing: Zetuvit Plus 4x8 in 1 x Per Week/30 Days Discharge Instructions: Apply over primary dressing as directed. Com pression Wrap: Urgo K2 Lite, (equivalent to a 3 layer) two layer compression system, regular 1 x Per Week/30 Days Discharge Instructions: Apply Urgo K2 Lite as directed (alternative to 3 layer compression). Com pression Wrap: Stockinette 1 x Per Week/30 Days 1. I have not changed the primary dressing GEN Mupin silver alginate 2. He is measuring slightly smaller. We had approval for TheraSkin but still too much drainage. We are covering this with ABD, Urgo K2 light Electronic Signature(s) Signed: 06/20/2023 5:41:20 PM By: [name] MD Entered By: [name] on 06/20/2023 12:43:25 -------------------------------------------------------------------------------- SuperBill Details Patient Name: Date of Service: [name] 06/20/2023 Medical Record Number: [id number] Patient Account Number: [id number] Date of Birth/Sex: Treating RN: [birth year] (52 y.o. Marlan Palau Primary Care Provider: Barbette Reichmann Other Clinician: Referring Provider: Treating Provider/Extender: Milas Kocher Weeks in Treatment: 20 Diagnosis Coding ICD-10 Codes Code Description [phone] Non-pressure chronic ulcer of other part of right lower leg with fat layer exposed E11.622 Type 2 diabetes mellitus with other skin ulcer I87.313 Chronic venous hypertension (idiopathic) with ulcer of bilateral lower  extremity JAIVIN, SILERIO K ([id number]) [id number] Page 7 of 7 Facility Procedures : CPT4 Code: [id number] Description: (Facility Use Only) [phone] - APPLY MULTLAY COMPRS LWR RT LEG ICD-10 Diagnosis Description L97.812 Non-pressure chronic ulcer of other part of right lower leg with fat layer exposed Modifier: Quantity: 1 Physician Procedures : CPT4 Code Description Modifier [id number] - WC PHYS LEVEL 3 - EST PT ICD-10 Diagnosis Description L97.812 Non-pressure chronic ulcer of other part of right lower leg with fat layer exposed E11.622 Type 2 diabetes mellitus with other skin ulcer Quantity: 1 Electronic Signature(s) Signed: 06/20/2023 5:41:20 PM By: [name] MD Entered By: [name] on 06/20/2023 12:44:01

## 2023-06-25 ENCOUNTER — Encounter (HOSPITAL_BASED_OUTPATIENT_CLINIC_OR_DEPARTMENT_OTHER): Payer: Medicaid Other | Admitting: Internal Medicine

## 2023-06-25 DIAGNOSIS — Z794 Long term (current) use of insulin: Secondary | ICD-10-CM | POA: Diagnosis not present

## 2023-06-25 DIAGNOSIS — Z86718 Personal history of other venous thrombosis and embolism: Secondary | ICD-10-CM | POA: Diagnosis not present

## 2023-06-25 DIAGNOSIS — M109 Gout, unspecified: Secondary | ICD-10-CM | POA: Diagnosis not present

## 2023-06-25 DIAGNOSIS — E11622 Type 2 diabetes mellitus with other skin ulcer: Secondary | ICD-10-CM | POA: Diagnosis not present

## 2023-06-25 DIAGNOSIS — I872 Venous insufficiency (chronic) (peripheral): Secondary | ICD-10-CM | POA: Diagnosis not present

## 2023-06-25 DIAGNOSIS — I10 Essential (primary) hypertension: Secondary | ICD-10-CM | POA: Diagnosis not present

## 2023-06-25 DIAGNOSIS — L97812 Non-pressure chronic ulcer of other part of right lower leg with fat layer exposed: Secondary | ICD-10-CM | POA: Diagnosis not present

## 2023-06-25 NOTE — Progress Notes (Signed)
[name], [name] ([id number]) [id number] Page 1 of 8 Visit Report for 06/25/2023 Arrival Information Details Patient Name: Date of Service: [name]. 06/25/2023 8:15 A M Medical Record Number: [id number] Patient Account Number: [id number] Date of Birth/Sex: Treating RN: [birth year] (52 y.o. [name] Primary Care Shaunte Weissinger: Barbette Reichmann Other Clinician: Referring Natoria Archibald: Treating Marik Sedore/Extender: Imagene Sheller in Treatment: 21 Visit Information History Since Last Visit Added or deleted any medications: No Patient Arrived: Ambulatory Any new allergies or adverse reactions: No Arrival Time: 08:22 Had a fall or experienced change in No Accompanied By: self activities of daily living that may affect Transfer Assistance: None risk of falls: Patient Identification Verified: Yes Signs or symptoms of abuse/neglect since last visito No Patient Requires Transmission-Based Precautions: No Hospitalized since last visit: No Patient Has Alerts: No Implantable device outside of the clinic excluding No cellular tissue based products placed in the center since last visit: Has Dressing in Place as Prescribed: Yes Pain Present Now: No Electronic Signature(s) Signed: 06/25/2023 9:14:12 AM By: Karie Schwalbe RN Entered By: Karie Schwalbe on 06/25/2023 05:22:49 -------------------------------------------------------------------------------- Compression Therapy Details Patient Name: Date of Service: [name] Fortes Price. 06/25/2023 8:15 A M Medical Record Number: [id number] Patient Account Number: [id number] Date of Birth/Sex: Treating RN: Apr 26, 1971 (52 y.o. [name] Primary Care Huck Ashworth: Barbette Reichmann Other Clinician: Referring Aleila Syverson: Treating Ailany Koren/Extender: Milas Kocher Weeks in Treatment: 21 Compression Therapy Performed for Wound Assessment: Wound #1  Right,Lateral Lower Leg Performed By: Clinician Karie Schwalbe, RN Compression Type: Three Layer Post Procedure Diagnosis Same as Pre-procedure Electronic Signature(s) Signed: 06/25/2023 9:14:12 AM By: Karie Schwalbe RN Entered By: Karie Schwalbe on 06/25/2023 05:56:55 [name] ([id number]) [id number] Page 2 of 8 -------------------------------------------------------------------------------- Encounter Discharge Information Details Patient Name: Date of Service: [name] 06/25/2023 8:15 A M Medical Record Number: [id number] Patient Account Number: [id number] Date of Birth/Sex: Treating RN: Nov 30, 1970 (52 y.o. [name] Primary Care Artavius Stearns: Barbette Reichmann Other Clinician: Referring Selig Wampole: Treating Noami Bove/Extender: Imagene Sheller in Treatment: 21 Encounter Discharge Information Items Discharge Condition: Stable Ambulatory Status: Ambulatory Discharge Destination: Home Transportation: Private Auto Accompanied By: self Schedule Follow-up Appointment: Yes Clinical Summary of Care: Patient Declined Electronic Signature(s) Signed: 06/25/2023 9:14:12 AM By: Karie Schwalbe RN Entered By: Karie Schwalbe on 06/25/2023 06:13:46 -------------------------------------------------------------------------------- Lower Extremity Assessment Details Patient Name: Date of Service: [name] 06/25/2023 8:15 A M Medical Record Number: [id number] Patient Account Number: [id number] Date of Birth/Sex: Treating RN: 08-Jul-1971 (52 y.o. [name] Primary Care Willisha Sligar: Barbette Reichmann Other Clinician: Referring Trevonn Hallum: Treating Lindsi Bayliss/Extender: Milas Kocher Weeks in Treatment: 21 Edema Assessment Assessed: [Left: No] [Right: No] Edema: [Left: N] [Right: o] Calf Left: Right: Point of Measurement: 34 cm From Medial Instep 31.1 cm Ankle Left:  Right: Point of Measurement: 10 cm From Medial Instep 21.5 cm Vascular Assessment Pulses: Dorsalis Pedis Palpable: [Right:Yes] Extremity colors, hair growth, and conditions: Hair Growth on Extremity: [Right:No] Temperature of Extremity: [Right:Warm] Capillary Refill: [Right:< 3 seconds] Dependent Rubor: [Right:No No] Electronic Signature(s) Signed: 06/25/2023 9:14:12 AM By: Karie Schwalbe RN Entered By: Karie Schwalbe on 06/25/2023 05:30:44 [name] ([id number]) [id number] Page 3 of 8 -------------------------------------------------------------------------------- Multi Wound Chart Details Patient Name: Date of Service: [name] 06/25/2023 8:15 A M Medical Record Number: [id number] Patient Account Number: [id number] Date of Birth/Sex: Treating RN: 1971-04-23 (52 y.o. M) Primary Care Desiraye Rolfson: Barbette Reichmann Other Clinician:  Referring Charls Custer: Treating Opal Dinning/Extender: Milas Kocher Weeks in Treatment: 21 Vital Signs Height(in): 70 Pulse(bpm): 59 Weight(lbs): 264 Blood Pressure(mmHg): 150/91 Body Mass Index(BMI): 37.9 Temperature(F): 98 Respiratory Rate(breaths/min): 16 [1:Photos:] [N/A:N/A] Right, Lateral Lower Leg N/A N/A Wound Location: Other Lesion N/A N/A Wounding Event: Diabetic Wound/Ulcer of the Lower N/A N/A Primary Etiology: Extremity Anemia, Deep Vein Thrombosis, N/A N/A Comorbid History: Hypertension, Peripheral Venous Disease, Type II Diabetes, Gout 01/01/2018 N/A N/A Date Acquired: 21 N/A N/A Weeks of Treatment: Open N/A N/A Wound Status: No N/A N/A Wound Recurrence: 4.5x1.2x0.2 N/A N/A Measurements L x W x D (cm) 4.241 N/A N/A A (cm) : rea 0.848 N/A N/A Volume (cm) : 67.50% N/A N/A % Reduction in A rea: 83.80% N/A N/A % Reduction in Volume: Grade 2 N/A N/A Classification: Medium N/A N/A Exudate A mount: Serosanguineous N/A N/A Exudate Type: red, brown N/A  N/A Exudate Color: Distinct, outline attached N/A N/A Wound Margin: Large (67-100%) N/A N/A Granulation A mount: Red, Pink N/A N/A Granulation Quality: Small (1-33%) N/A N/A Necrotic A mount: Eschar, Adherent Slough N/A N/A Necrotic Tissue: Fat Layer (Subcutaneous Tissue): Yes N/A N/A Exposed Structures: Fascia: No Tendon: No Muscle: No Joint: No Bone: No Medium (34-66%) N/A N/A Epithelialization: Scarring: Yes N/A N/A Periwound Skin Texture: No Abnormalities Noted N/A N/A Periwound Skin Moisture: Hemosiderin Staining: Yes N/A N/A Periwound Skin Color: No Abnormality N/A N/A Temperature: Compression Therapy N/A N/A Procedures Performed: Treatment Notes Wound #1 (Lower Leg) Wound Laterality: Right, Lateral Cleanser Soap and Water Discharge Instruction: May shower and wash wound with dial antibacterial soap and water prior to dressing change. DEUNTA, GEISSLER ([id number]) [id number] Page 4 of 8 Vashe 5.8 (oz) Discharge Instruction: Cleanse the wound with Vashe prior to applying a clean dressing using gauze sponges, not tissue or cotton balls. Peri-Wound Care Triamcinolone 15 (g) Discharge Instruction: Use triamcinolone 15 (g) as directed Sween Lotion (Moisturizing lotion) Discharge Instruction: Apply moisturizing lotion as directed Topical Gentamicin Discharge Instruction: mix with mupirocin and apply directly to wound bed. Mupirocin Ointment Discharge Instruction: Apply Mupirocin mix gentamicin to wound bed. Primary Dressing Maxorb Extra Ag+ Alginate Dressing, 2x2 (in/in) Discharge Instruction: Apply to wound bed as instructed Secondary Dressing ABD Pad, 8x10 Discharge Instruction: mPad as needed the Right lateral ankle to alleviate pressure on right medial ankle.Apply over primary dressing as directed. CarboFLEX Odor Control Dressing, 4x4 in Discharge Instruction: Apply over primary dressing as directed. Woven Gauze Sponge,  Non-Sterile 4x4 in Discharge Instruction: Apply over primary dressing as directed. Zetuvit Plus 4x8 in Discharge Instruction: Apply over primary dressing as directed. Secured With Compression Wrap Urgo K2 Lite, (equivalent to a 3 layer) two layer compression system, regular Discharge Instruction: Apply Urgo K2 Lite as directed (alternative to 3 layer compression). Stockinette Compression Stockings Facilities manager) Signed: 06/26/2023 10:03:28 AM By: [name] MD Entered By: [name] on 06/25/2023 06:35:16 -------------------------------------------------------------------------------- Multi-Disciplinary Care Plan Details Patient Name: Date of Service: [name] Fortes Price. 06/25/2023 8:15 A M Medical Record Number: [id number] Patient Account Number: [id number] Date of Birth/Sex: Treating RN: 12-Feb-1971 (52 y.o. [name] Primary Care Bellamy Rubey: Barbette Reichmann Other Clinician: Referring Olando Willems: Treating Ariyan Sinnett/Extender: Milas Kocher Weeks in Treatment: 21 Active Inactive Wound/Skin Impairment Nursing Diagnoses: Impaired tissue integrity GoalsHOVHANNES, BHATNAGAR ([id number]) [id number] Page 5 of 8 Patient/caregiver will verbalize understanding of skin care regimen Date Initiated: 01/25/2023 Target Resolution Date: 09/13/2023 Goal Status: Active Interventions: Assess ulceration(s) every visit Treatment Activities: Skin care regimen initiated :  01/25/2023 Notes: Electronic Signature(s) Signed: 06/25/2023 9:14:12 AM By: Karie Schwalbe RN Entered By: Karie Schwalbe on 06/25/2023 05:57:17 -------------------------------------------------------------------------------- Pain Assessment Details Patient Name: Date of Service: [name] 06/25/2023 8:15 A M Medical Record Number: [id number] Patient Account Number: [id number] Date of Birth/Sex: Treating RN: Jun 03, 1971 (52 y.o. [name] Primary Care Chrisma Hurlock: Barbette Reichmann Other Clinician: Referring Camellia Popescu: Treating Dorothyann Mourer/Extender: Milas Kocher Weeks in Treatment: 21 Active Problems Location of Pain Severity and Description of Pain Patient Has Paino No Site Locations Pain Management and Medication Current Pain Management: Electronic Signature(s) Signed: 06/25/2023 9:14:12 AM By: Karie Schwalbe RN Entered By: Karie Schwalbe on 06/25/2023 05:24:36 -------------------------------------------------------------------------------- Patient/Caregiver Education Details Patient Name: Date of Service: [name] [address] 12/10/2024andnbsp8:15 A M Price, [name] [id number] Page 6 of 8 Medical Record Number: [id number] Patient Account Number: [id number] Date of Birth/Gender: Treating RN: 10-01-1970 (52 y.o. [name] Primary Care Physician: Barbette Reichmann Other Clinician: Referring Physician: Treating Physician/Extender: Imagene Sheller in Treatment: 21 Education Assessment Education Provided To: Patient Education Topics Provided Wound/Skin Impairment: Methods: Explain/Verbal Responses: State content correctly Electronic Signature(s) Signed: 06/25/2023 9:14:12 AM By: Karie Schwalbe RN Entered By: Karie Schwalbe on 06/25/2023 05:58:15 -------------------------------------------------------------------------------- Wound Assessment Details Patient Name: Date of Service: [name] [name]. 06/25/2023 8:15 A M Medical Record Number: [id number] Patient Account Number: [id number] Date of Birth/Sex: Treating RN: 03/15/1971 (52 y.o. 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 Weeks Of Treatment: 21 Comorbid Anemia, Deep Vein Thrombosis, Hypertension, Peripheral Venous Clustered Wound: No History: Disease, Type II Diabetes, Gout Photos Wound Measurements Length: (cm) 4.5 Width: (cm) 1.2 Depth: (cm) 0.2 Area: (cm) 4.241 Volume: (cm) 0.848 % Reduction in Area: 67.5% % Reduction in Volume: 83.8% Epithelialization: Medium (34-66%) Tunneling: No Undermining: No Wound Description Classification: Grade 2 Wound Margin: Distinct, outline attached Exudate Amount: Medium [name], [name] ([id number]) Exudate Type: Serosanguineous Exudate Color: red, brown Foul Odor After Cleansing: No Slough/Fibrino Yes [id number] Page 7 of 8 Wound Bed Granulation Amount: Large (67-100%) Exposed Structure Granulation Quality: Red, Pink Fascia Exposed: No Necrotic Amount: Small (1-33%) Fat Layer (Subcutaneous Tissue) Exposed: Yes Necrotic Quality: Eschar, Adherent Slough Tendon Exposed: No Muscle Exposed: No Joint Exposed: No Bone Exposed: No Periwound Skin Texture Texture Color No Abnormalities Noted: No No Abnormalities Noted: No Scarring: Yes Hemosiderin Staining: Yes Moisture Temperature / Pain No Abnormalities Noted: Yes Temperature: No Abnormality Treatment Notes Wound #1 (Lower Leg) Wound Laterality: Right, Lateral Cleanser Soap and Water Discharge Instruction: May shower and wash wound with dial antibacterial soap and water prior to dressing change. Vashe 5.8 (oz) Discharge Instruction: Cleanse the wound with Vashe prior to applying a clean dressing using gauze sponges, not tissue or cotton balls. Peri-Wound Care Triamcinolone 15 (g) Discharge Instruction: Use triamcinolone 15 (g) as directed Sween Lotion (Moisturizing  lotion) Discharge Instruction: Apply moisturizing lotion as directed Topical Gentamicin Discharge Instruction: mix with mupirocin and apply directly to wound bed. Mupirocin Ointment Discharge Instruction: Apply Mupirocin mix gentamicin to wound bed. Primary Dressing Maxorb Extra Ag+ Alginate Dressing, 2x2 (in/in) Discharge Instruction: Apply to wound bed as instructed Secondary Dressing ABD Pad, 8x10  Discharge Instruction: mPad as needed the Right lateral ankle to alleviate pressure on right medial ankle.Apply over primary dressing as directed. CarboFLEX Odor Control Dressing, 4x4 in Discharge Instruction: Apply over primary dressing as directed. Woven Gauze Sponge, Non-Sterile 4x4 in Discharge Instruction: Apply over primary dressing as directed. Zetuvit Plus 4x8 in Discharge Instruction: Apply over primary dressing as directed. Secured With Compression Wrap Urgo K2 Lite, (equivalent to a 3 layer) two layer compression system, regular Discharge Instruction: Apply Urgo K2 Lite as directed (alternative to 3 layer compression). Stockinette Compression Stockings Facilities manager) Signed: 06/25/2023 9:14:12 AM By: Karie Schwalbe RN Entered By: Karie Schwalbe on 06/25/2023 05:36:06 [name] ([id number]) [id number] Page 8 of 8 -------------------------------------------------------------------------------- Vitals Details Patient Name: Date of Service: [name] 06/25/2023 8:15 A M Medical Record Number: [id number] Patient Account Number: [id number] Date of Birth/Sex: Treating RN: 19-Mar-1971 (52 y.o. [name] Primary Care Kaio Kuhlman: Barbette Reichmann Other Clinician: Referring Farha Dano: Treating Levaeh Vice/Extender: Milas Kocher Weeks in Treatment: 21 Vital Signs Time Taken: 08:24 Temperature (F): 98 Height (in): 70 Pulse (bpm): 59 Weight (lbs): 264 Respiratory Rate (breaths/min):  16 Body Mass Index (BMI): 37.9 Blood Pressure (mmHg): 150/91 Reference Range: 80 - 120 mg / dl Electronic Signature(s) Signed: 06/25/2023 9:14:12 AM By: Karie Schwalbe RN Entered By: Karie Schwalbe on 06/25/2023 05:33:21

## 2023-06-26 NOTE — Progress Notes (Signed)
[name], [name] ([id number]) [id number] Page 1 of 7 Visit Report for 06/25/2023 HPI Details Patient Name: Date of Service: [name] 06/25/2023 8:15 A M Medical Record Number: [id number] Patient Account Number: [id number] Date of Birth/Sex: Treating RN: 08/02/1970 (52 y.o. M) Primary Care Provider: Barbette Price Other Clinician: Referring Provider: Treating Provider/Extender: [name] [name] in Treatment: 21 History of Present Illness HPI Description: 01/25/2023 [name] is a 52 year old male with a past medical history of controlled type 2 diabetes on insulin, venous insufficiency and DVT that presents to the clinic for bilateral lower extremity wounds. He states that the wound on the left was started by a bug bite and has been present for the past 4 months. The right lower extremity wound he states started out as a soft tissue infection and has been present for the past 6 years. He has been treated by a wound care center in [facility] in [place]. He was last seen in the wound care center 2 years ago Could not continue following up due to lack of transportation. He states that recently the rehab center has been wrapping his legs with compression wrap and a wound dressing underneath. It is unclear when the last time this happened was. He is not wearing any compression today. He currently denies signs of infection. 7/19; patient presents for follow-up. We have been using antibiotic ointment with Hydrofera Blue under 3 layer compression to the lower extremities bilaterally. Wounds are smaller. 7/30; patient presents for follow-up. We have been using antibiotic ointment and Hydrofera Blue under 3 layer compression to the lower extremities bilaterally. Wounds are smaller. 8/6; patient presents for follow-up. We have been using antibiotic ointment with Hydrofera Blue under 3 layer compression to  the lower extremities bilaterally. Wounds are stable. He denies signs of infection. 10/1; the patient has not been here for 3 [name]. He completed his course of Apligraf to the wound on the right lower extremity. Both legs were wrapped for 3 [name]. Fortunately the area on the left lower leg is healed however the right leg had debris on the surface and maceration but fortunately no evidence of infection. He reminds me that he had an extensive surgery on this area apparently several years ago by Dr. Lajoyce Price for a "staph infection". He has compression stockings from elastic therapy in [place] he got last May he says they are relatively new he is hardly worn them. 8/15; patient presents for follow-up. We have been using collagen to the left lower extremity and PolyMem silver to the right lower extremity all under 3 layer compression. Wounds are slightly smaller today. Patient has been approved for Apligraf and he was agreeable to having this placed in office. 8/20; patient presents for follow-up. At last clinic visit we used Apligraf to the right lower extremity under 3 layer compression and collagen to the left lower extremity under 3 layer compression. Wounds are smaller. 8/27; Patient presents for follow-up. We have been using Apligraf to the right lower extremity wound and collagen to the left lower extremity wound under 3 layer compression. Overall wounds appear well-healing. He has no issues or complaints. 9/30; patient presents for follow-up. We have been using Apligraf to the right lower extremity wound and switch to PolyMem to the left lower extremity wound. All under 3 layer compression. Wounds are well-healing. 9/10; patient presents for follow-up. We have been using Apligraf to the right lower extremity wound and PolyMem silver to the left lower  extremity wound all under 3 layer compression. Wounds are smaller. 10/8; last week we changed the primary dressing on the large right lower leg wound in  the middle of scar tissue to Hydrofera Blue. He comes in today with a surface not looking particularly healthy. ALSO he has had a reopening in the area of the original left calf wound apparently happened while he was washing off the wound area. He is put polymen silver on this and his own compression stocking since 10/15; the area on the left posterior calf is healed. The large wound on the right in the middle lips scar tissue we changed to Iodoflex I believe last week. Surface looks a lot better 10/29; the patient was hospitalized last week for a cellulitis in the submandibular region on the left. He was in hospital from 10/19 through 10/22. He feels a lot better. He says they took his wrap off and put Mepilex on the wound. He got out a week ago and that is what he has been doing at home. The wound is on the right lateral calf the area on the left posterior calf is healed as it was last time. The wound is in the middle of his surgical incision for a staph infection 11/5; wound on the right lateral leg the area on the left posterior calf remains healed. This is likely chronic venous in etiology. He also had a lot of scar in this area from surgery had by Dr. Lajoyce Price several years ago for staph infection. He had a run of Apligraf in September. We got approved for TheraSkin although he has a lot of drainage here I think we can make any skin substitute not a viable option at the moment 11/19; wound on the right lateral leg. This is likely chronic venous in etiology although he has had previous surgery in this area by Dr. Lajoyce Price several years ago for staph infection. We had approval for TheraSkin although he for some reason he is having too much drainage to consider that. We have been using silver alginate. The wound is measuring smaller today with some rims of epithelialization but still moderate drainage 12/5; right lateral leg. This is a area that has had previous surgery for an infection. It is likely  chronic venous. We have had approval for TheraSkin although there is still quite a bit of drainage here. We are using gent Mupin silver alginate. Measurements are slightly smaller 12/10; right lateral lower leg. He continues to make continued progress albeit slowly. The surface of his wound looks healthy. The drainage that we were concerned about in the last 2 [name] has abated. It would be possible now to use the TheraSkin but we have to run prior approval again. Apparently his insurance fiscal year started on 06/16/2023. Currently we are using gent, mupirocin and silver alginate. As noted continued improvement [name], VALLEY ([id number]) [phone].pdf Page 2 of 7 Electronic Signature(s) Signed: 06/26/2023 10:03:28 AM By: [name] MD Entered By: [name] on 06/25/2023 06:37:14 -------------------------------------------------------------------------------- Physical Exam Details Patient Name: Date of Service: [name] 06/25/2023 8:15 A M Medical Record Number: [id number] Patient Account Number: [id number] Date of Birth/Sex: Treating RN: Sep 23, 1970 (52 y.o. M) Primary Care Provider: Barbette Price Other Clinician: Referring Provider: Treating Provider/Extender: [name] [name] in Treatment: 21 Constitutional Patient is hypertensive.. Pulse regular and within target range for patient.Marland Kitchen Respirations regular, non-labored and within target range.. Temperature is normal and within the target range for the patient.Marland Kitchen Appears in  no distress. Notes Wound exam; right lateral lower leg in the middle of scar tissue from previous surgery [soft tissue infection]. The granulation tissue looks really quite healthy. Minimal to no drainage is seen. The wound is measuring smaller. No debridement is necessary. No evidence of surrounding infection Electronic Signature(s) Signed: 06/26/2023 10:03:28 AM By: [name]  MD Entered By: [name] on 06/25/2023 06:38:05 -------------------------------------------------------------------------------- Physician Orders Details Patient Name: Date of Service: [name] 06/25/2023 8:15 A M Medical Record Number: [id number] Patient Account Number: [id number] Date of Birth/Sex: Treating RN: Jan 24, 1971 (52 y.o. [name] Primary Care Provider: Barbette Price Other Clinician: Referring Provider: Treating Provider/Extender: [name] [name] in Treatment: 21 Verbal / Phone Orders: No Diagnosis Coding Follow-up Appointments ppointment in 1 week. - Dr. Leanord Hawking 07/02/23 at 9am Return A Anesthetic (In clinic) Topical Lidocaine 4% applied to wound bed Cellular or Tissue Based Products Wound #1 Right,Lateral Lower Leg Cellular or Tissue Based Product Type: - Apligraf #1 applied 02/28/23 Apligraf #2 applied 03/05/2023 Apligraf #3 applied 03/12/2023 Apligraf #4 applied 03/19/2023 Apligraf #5 applied on 03/26/2023 Other Cellular or Tissue Based Products Orders/Instructions: - RUN IVR FOR THERASKIN-05/14/23- approved 100%- have to rerun on 06/16/2023 due to insurance renews. 06/25/23 NOT doing Theraskin Bathing/ [employer] May shower with protection but do not get wound dressing(s) wet. Protect dressing(s) with water repellant cover (for example, large plastic bag) or a cast cover and may then take shower. - Please do not get the legs wet. Please use cast protectors when showering. Cast protectors can be purchased from [employer], Medical supply store. Cost ranges from $17-$37 [name], [name] ([id number]) [phone].pdf Page 3 of 7 Off-Loading Other: - Keep legs elevated to heart level or above when sitting. You may place a pillow behind the calf for comfort, if so desired. Wound Treatment Wound #1 - Lower Leg Wound Laterality: Right, Lateral Cleanser: Soap and Water 1 x  Per Week/30 Days Discharge Instructions: May shower and wash wound with dial antibacterial soap and water prior to dressing change. Cleanser: Vashe 5.8 (oz) 1 x Per Week/30 Days Discharge Instructions: Cleanse the wound with Vashe prior to applying a clean dressing using gauze sponges, not tissue or cotton balls. Peri-Wound Care: Triamcinolone 15 (g) 1 x Per Week/30 Days Discharge Instructions: Use triamcinolone 15 (g) as directed Peri-Wound Care: Sween Lotion (Moisturizing lotion) 1 x Per Week/30 Days Discharge Instructions: Apply moisturizing lotion as directed Topical: Gentamicin 1 x Per Week/30 Days Discharge Instructions: mix with mupirocin and apply directly to wound bed. Topical: Mupirocin Ointment 1 x Per Week/30 Days Discharge Instructions: Apply Mupirocin mix gentamicin to wound bed. Prim Dressing: Maxorb Extra Ag+ Alginate Dressing, 2x2 (in/in) 1 x Per Week/30 Days ary Discharge Instructions: Apply to wound bed as instructed Secondary Dressing: ABD Pad, 8x10 1 x Per Week/30 Days Discharge Instructions: mPad as needed the Right lateral ankle to alleviate pressure on right medial ankle.Apply over primary dressing as directed. Secondary Dressing: CarboFLEX Odor Control Dressing, 4x4 in 1 x Per Week/30 Days Discharge Instructions: Apply over primary dressing as directed. Secondary Dressing: Woven Gauze Sponge, Non-Sterile 4x4 in 1 x Per Week/30 Days Discharge Instructions: Apply over primary dressing as directed. Secondary Dressing: Zetuvit Plus 4x8 in 1 x Per Week/30 Days Discharge Instructions: Apply over primary dressing as directed. Compression Wrap: Urgo K2 Lite, (equivalent to a 3 layer) two layer compression system, regular 1 x Per Week/30 Days Discharge Instructions: Apply Urgo K2 Lite as directed (alternative to 3  layer compression). Compression Wrap: Stockinette 1 x Per Week/30 Days Electronic Signature(s) Signed: 06/25/2023 9:14:12 AM By: Karie Schwalbe RN Signed:  06/26/2023 10:03:28 AM By: [name] MD Entered By: Karie Schwalbe on 06/25/2023 06:04:23 -------------------------------------------------------------------------------- Problem List Details Patient Name: Date of Service: [name] 06/25/2023 8:15 A M Medical Record Number: [id number] Patient Account Number: [id number] Date of Birth/Sex: Treating RN: [birth year] (52 y.o. M) Primary Care Provider: Barbette Price Other Clinician: Referring Provider: Treating Provider/Extender: [name] [name] in Treatment: 21 Active Problems ICD-10 Encounter Code Description Active Date MDM Diagnosis L97.812 Non-pressure chronic ulcer of other part of right lower leg with fat layer 01/25/2023 No Yes exposed MADELYN, [name] Price ([id number]) [phone].pdf Page 4 of 7 E11.622 Type 2 diabetes mellitus with other skin ulcer 01/25/2023 No Yes I87.313 Chronic venous hypertension (idiopathic) with ulcer of bilateral lower extremity 01/25/2023 No Yes Inactive Problems ICD-10 Code Description Active Date Inactive Date L97.822 Non-pressure chronic ulcer of other part of left lower leg with fat layer exposed 01/25/2023 01/25/2023 Resolved Problems Electronic Signature(s) Signed: 06/26/2023 10:03:28 AM By: [name] MD Entered By: [name] on 06/25/2023 06:34:55 -------------------------------------------------------------------------------- Progress Note Details Patient Name: Date of Service: [name] 06/25/2023 8:15 A M Medical Record Number: [id number] Patient Account Number: [id number] Date of Birth/Sex: Treating RN: 1970-07-22 (52 y.o. M) Primary Care Provider: Barbette Price Other Clinician: Referring Provider: Treating Provider/Extender: [name] [name] in Treatment: 21 Subjective History of Present Illness (HPI) 01/25/2023 Mr. Ajan Cutrer is a 52 year old male with a past  medical history of controlled type 2 diabetes on insulin, venous insufficiency and DVT that presents to the clinic for bilateral lower extremity wounds. He states that the wound on the left was started by a bug bite and has been present for the past 4 months. The right lower extremity wound he states started out as a soft tissue infection and has been present for the past 6 years. He has been treated by a wound care center in [facility] in [place]. He was last seen in the wound care center 2 years ago Could not continue following up due to lack of transportation. He states that recently the rehab center has been wrapping his legs with compression wrap and a wound dressing underneath. It is unclear when the last time this happened was. He is not wearing any compression today. He currently denies signs of infection. 7/19; patient presents for follow-up. We have been using antibiotic ointment with Hydrofera Blue under 3 layer compression to the lower extremities bilaterally. Wounds are smaller. 7/30; patient presents for follow-up. We have been using antibiotic ointment and Hydrofera Blue under 3 layer compression to the lower extremities bilaterally. Wounds are smaller. 8/6; patient presents for follow-up. We have been using antibiotic ointment with Hydrofera Blue under 3 layer compression to the lower extremities bilaterally. Wounds are stable. He denies signs of infection. 10/1; the patient has not been here for 3 [name]. He completed his course of Apligraf to the wound on the right lower extremity. Both legs were wrapped for 3 [name]. Fortunately the area on the left lower leg is healed however the right leg had debris on the surface and maceration but fortunately no evidence of infection. He reminds me that he had an extensive surgery on this area apparently several years ago by Dr. Lajoyce Price for a "staph infection". He has compression stockings from elastic therapy in [place] he got  last May he says  they are relatively new he is hardly worn them. 8/15; patient presents for follow-up. We have been using collagen to the left lower extremity and PolyMem silver to the right lower extremity all under 3 layer compression. Wounds are slightly smaller today. Patient has been approved for Apligraf and he was agreeable to having this placed in office. 8/20; patient presents for follow-up. At last clinic visit we used Apligraf to the right lower extremity under 3 layer compression and collagen to the left lower extremity under 3 layer compression. Wounds are smaller. 8/27; Patient presents for follow-up. We have been using Apligraf to the right lower extremity wound and collagen to the left lower extremity wound under 3 layer compression. Overall wounds appear well-healing. He has no issues or complaints. 9/30; patient presents for follow-up. We have been using Apligraf to the right lower extremity wound and switch to PolyMem to the left lower extremity wound. [name], [name] ([id number]) [id number] Page 5 of 7 All under 3 layer compression. Wounds are well-healing. 9/10; patient presents for follow-up. We have been using Apligraf to the right lower extremity wound and PolyMem silver to the left lower extremity wound all under 3 layer compression. Wounds are smaller. 10/8; last week we changed the primary dressing on the large right lower leg wound in the middle of scar tissue to Hydrofera Blue. He comes in today with a surface not looking particularly healthy. ALSO he has had a reopening in the area of the original left calf wound apparently happened while he was washing off the wound area. He is put polymen silver on this and his own compression stocking since 10/15; the area on the left posterior calf is healed. The large wound on the right in the middle lips scar tissue we changed to Iodoflex I believe last week. Surface looks a lot better 10/29; the  patient was hospitalized last week for a cellulitis in the submandibular region on the left. He was in hospital from 10/19 through 10/22. He feels a lot better. He says they took his wrap off and put Mepilex on the wound. He got out a week ago and that is what he has been doing at home. The wound is on the right lateral calf the area on the left posterior calf is healed as it was last time. The wound is in the middle of his surgical incision for a staph infection 11/5; wound on the right lateral leg the area on the left posterior calf remains healed. This is likely chronic venous in etiology. He also had a lot of scar in this area from surgery had by Dr. Lajoyce Price several years ago for staph infection. He had a run of Apligraf in September. We got approved for TheraSkin although he has a lot of drainage here I think we can make any skin substitute not a viable option at the moment 11/19; wound on the right lateral leg. This is likely chronic venous in etiology although he has had previous surgery in this area by Dr. Lajoyce Price several years ago for staph infection. We had approval for TheraSkin although he for some reason he is having too much drainage to consider that. We have been using silver alginate. The wound is measuring smaller today with some rims of epithelialization but still moderate drainage 12/5; right lateral leg. This is a area that has had previous surgery for an infection. It is likely chronic venous. We have had approval for TheraSkin although there is still quite a bit of drainage  here. We are using gent Mupin silver alginate. Measurements are slightly smaller 12/10; right lateral lower leg. He continues to make continued progress albeit slowly. The surface of his wound looks healthy. The drainage that we were concerned about in the last 2 [name] has abated. It would be possible now to use the TheraSkin but we have to run prior approval again. Apparently his insurance fiscal year started on  06/16/2023. Currently we are using gent, mupirocin and silver alginate. As noted continued improvement Objective Constitutional Patient is hypertensive.. Pulse regular and within target range for patient.Marland Kitchen Respirations regular, non-labored and within target range.. Temperature is normal and within the target range for the patient.Marland Kitchen Appears in no distress. Vitals Time Taken: 8:24 AM, Height: 70 in, Weight: 264 lbs, BMI: 37.9, Temperature: 98 F, Pulse: 59 bpm, Respiratory Rate: 16 breaths/min, Blood Pressure: 150/91 mmHg. General Notes: Wound exam; right lateral lower leg in the middle of scar tissue from previous surgery [soft tissue infection]. The granulation tissue looks really quite healthy. Minimal to no drainage is seen. The wound is measuring smaller. No debridement is necessary. No evidence of surrounding infection Integumentary (Hair, Skin) Wound #1 status is Open. Original cause of wound was Other Lesion. The date acquired was: 01/01/2018. The wound has been in treatment 21 [name]. The wound is located on the Right,Lateral Lower Leg. The wound measures 4.5cm length x 1.2cm width x 0.2cm depth; 4.241cm^2 area and 0.848cm^3 volume. There is Fat Layer (Subcutaneous Tissue) exposed. There is no tunneling or undermining noted. There is a medium amount of serosanguineous drainage noted. The wound margin is distinct with the outline attached to the wound base. There is large (67-100%) red, pink granulation within the wound bed. There is a small (1-33%) amount of necrotic tissue within the wound bed including Eschar and Adherent Slough. The periwound skin appearance had no abnormalities noted for moisture. The periwound skin appearance exhibited: Scarring, Hemosiderin Staining. Periwound temperature was noted as No Abnormality. Assessment Active Problems ICD-10 Non-pressure chronic ulcer of other part of right lower leg with fat layer exposed Type 2 diabetes mellitus with other skin ulcer Chronic  venous hypertension (idiopathic) with ulcer of bilateral lower extremity Procedures Wound #1 Pre-procedure diagnosis of Wound #1 is a Diabetic Wound/Ulcer of the Lower Extremity located on the Right,Lateral Lower Leg . There was a Three Layer Compression Therapy Procedure by Karie Schwalbe, RN. Post procedure Diagnosis Wound #1: Same as Pre-Procedure [name], [name] ([id number]) [phone].pdf Page 6 of 7 Plan Follow-up Appointments: Return Appointment in 1 week. - Dr. Leanord Hawking 07/02/23 at 9am Anesthetic: (In clinic) Topical Lidocaine 4% applied to wound bed Cellular or Tissue Based Products: Wound #1 Right,Lateral Lower Leg: Cellular or Tissue Based Product Type: - Apligraf #1 applied 02/28/23 Apligraf #2 applied 03/05/2023 Apligraf #3 applied 03/12/2023 Apligraf #4 applied 03/19/2023 Apligraf #5 applied on 03/26/2023 Other Cellular or Tissue Based Products Orders/Instructions: - RUN IVR FOR THERASKIN-05/14/23- approved 100%- have to rerun on 06/16/2023 due to insurance renews. 06/25/23 NOT doing Theraskin Bathing/ Shower/ Hygiene: May shower with protection but do not get wound dressing(s) wet. Protect dressing(s) with water repellant cover (for example, large plastic bag) or a cast cover and may then take shower. - Please do not get the legs wet. Please use cast protectors when showering. Cast protectors can be purchased from [employer], Medical supply store. Cost ranges from $17-$37 Off-Loading: Other: - Keep legs elevated to heart level or above when sitting. You may place a pillow behind the calf for comfort,  if so desired. WOUND #1: - Lower Leg Wound Laterality: Right, Lateral Cleanser: Soap and Water 1 x Per Week/30 Days Discharge Instructions: May shower and wash wound with dial antibacterial soap and water prior to dressing change. Cleanser: Vashe 5.8 (oz) 1 x Per Week/30 Days Discharge Instructions: Cleanse the wound with Vashe prior to  applying a clean dressing using gauze sponges, not tissue or cotton balls. Peri-Wound Care: Triamcinolone 15 (g) 1 x Per Week/30 Days Discharge Instructions: Use triamcinolone 15 (g) as directed Peri-Wound Care: Sween Lotion (Moisturizing lotion) 1 x Per Week/30 Days Discharge Instructions: Apply moisturizing lotion as directed Topical: Gentamicin 1 x Per Week/30 Days Discharge Instructions: mix with mupirocin and apply directly to wound bed. Topical: Mupirocin Ointment 1 x Per Week/30 Days Discharge Instructions: Apply Mupirocin mix gentamicin to wound bed. Prim Dressing: Maxorb Extra Ag+ Alginate Dressing, 2x2 (in/in) 1 x Per Week/30 Days ary Discharge Instructions: Apply to wound bed as instructed Secondary Dressing: ABD Pad, 8x10 1 x Per Week/30 Days Discharge Instructions: mPad as needed the Right lateral ankle to alleviate pressure on right medial ankle.Apply over primary dressing as directed. Secondary Dressing: CarboFLEX Odor Control Dressing, 4x4 in 1 x Per Week/30 Days Discharge Instructions: Apply over primary dressing as directed. Secondary Dressing: Woven Gauze Sponge, Non-Sterile 4x4 in 1 x Per Week/30 Days Discharge Instructions: Apply over primary dressing as directed. Secondary Dressing: Zetuvit Plus 4x8 in 1 x Per Week/30 Days Discharge Instructions: Apply over primary dressing as directed. Com pression Wrap: Urgo K2 Lite, (equivalent to a 3 layer) two layer compression system, regular 1 x Per Week/30 Days Discharge Instructions: Apply Urgo K2 Lite as directed (alternative to 3 layer compression). Com pression Wrap: Stockinette 1 x Per Week/30 Days 1. I continued with the gentamicin mupirocin and silver alginate based dressings. We are using Urgo K2 light compression. 2. We could attempt TheraSkin now. I did not order this today we would have to run a reauthorization if we want to proceed in this direction. Will decide this next visit Electronic Signature(s) Signed:  06/26/2023 10:03:28 AM By: [name] MD Entered By: [name] on 06/25/2023 06:39:11 -------------------------------------------------------------------------------- SuperBill Details Patient Name: Date of Service: [name] 06/25/2023 Medical Record Number: [id number] Patient Account Number: [id number] Date of Birth/Sex: Treating RN: [birth year] (52 y.o. [name] Primary Care Provider: Barbette Price Other Clinician: Referring Provider: Treating Provider/Extender: [name] [name] in Treatment: 21 Diagnosis Coding ICD-10 Codes Code Description [id number] Non-pressure chronic ulcer of other part of right lower leg with fat layer exposed E11.622 Type 2 diabetes mellitus with other skin ulcer [name], [name] ([id number]) [phone].pdf Page 7 of 7 I87.313 Chronic venous hypertension (idiopathic) with ulcer of bilateral lower extremity Facility Procedures : CPT4 Code: [id number] Description: (Facility Use Only) [phone] - APPLY MULTLAY COMPRS LWR RT LEG Modifier: Quantity: 1 Physician Procedures : CPT4 Code Description Modifier [id number] - WC PHYS LEVEL 3 - EST PT ICD-10 Diagnosis Description L97.812 Non-pressure chronic ulcer of other part of right lower leg with fat layer exposed I87.313 Chronic venous hypertension (idiopathic) with ulcer  of bilateral lower extremity E11.622 Type 2 diabetes mellitus with other skin ulcer Quantity: 1 Electronic Signature(s) Signed: 06/26/2023 10:03:28 AM By: [name] MD Previous Signature: 06/25/2023 9:14:12 AM Version By: Karie Schwalbe RN Entered By: [name] on 06/25/2023 57:32:20

## 2023-07-02 ENCOUNTER — Encounter (HOSPITAL_BASED_OUTPATIENT_CLINIC_OR_DEPARTMENT_OTHER): Payer: Medicaid Other | Admitting: Internal Medicine

## 2023-07-02 DIAGNOSIS — Z794 Long term (current) use of insulin: Secondary | ICD-10-CM | POA: Diagnosis not present

## 2023-07-02 DIAGNOSIS — L97812 Non-pressure chronic ulcer of other part of right lower leg with fat layer exposed: Secondary | ICD-10-CM | POA: Diagnosis not present

## 2023-07-02 DIAGNOSIS — M109 Gout, unspecified: Secondary | ICD-10-CM | POA: Diagnosis not present

## 2023-07-02 DIAGNOSIS — Z86718 Personal history of other venous thrombosis and embolism: Secondary | ICD-10-CM | POA: Diagnosis not present

## 2023-07-02 DIAGNOSIS — I872 Venous insufficiency (chronic) (peripheral): Secondary | ICD-10-CM | POA: Diagnosis not present

## 2023-07-02 DIAGNOSIS — I10 Essential (primary) hypertension: Secondary | ICD-10-CM | POA: Diagnosis not present

## 2023-07-02 DIAGNOSIS — E11622 Type 2 diabetes mellitus with other skin ulcer: Secondary | ICD-10-CM | POA: Diagnosis not present

## 2023-07-03 NOTE — Progress Notes (Signed)
[name] Price, [name] Price ([id number]) [id number] Page 1 of 8 Visit Report for 07/02/2023 Debridement Details Patient Name: Date of Service: [name] [name] [name] Price. 07/02/2023 9:00 A M Medical Record Number: [id number] Patient Account Number: [id number] Date of Birth/Sex: Treating RN: 09-23-1970 (52 y.o. M) Primary Care Provider: Barbette Reichmann Other Clinician: Referring Provider: Treating Provider/Extender: [name] Price Weeks in Treatment: 22 Debridement Performed for Assessment: Wound #1 Right,Lateral Lower Leg Performed By: Physician [name] Caul., MD The following information was scribed by: Fonnie Mu The information was scribed for: [name] Debridement Type: Debridement Severity of Tissue Pre Debridement: Fat layer exposed Level of Consciousness (Pre-procedure): Awake and Alert Pre-procedure Verification/Time Out Yes - 09:58 Taken: Start Time: 09:58 Pain Control: Lidocaine Percent of Wound Bed Debrided: 100% T Area Debrided (cm): otal 4.32 Tissue and other material debrided: Viable, Non-Viable, Callus, Skin: Dermis , Skin: Epidermis Level: Skin/Epidermis Debridement Description: Selective/Open Wound Instrument: Curette Bleeding: Minimum Hemostasis Achieved: Pressure End Time: 09:58 Procedural Pain: 0 Post Procedural Pain: 0 Response to Treatment: Procedure was tolerated well Level of Consciousness (Post- Awake and Alert procedure): Post Debridement Measurements of Total Wound Length: (cm) 5.5 Width: (cm) 1 Depth: (cm) 0.2 Volume: (cm) 0.864 Character of Wound/Ulcer Post Debridement: Improved Severity of Tissue Post Debridement: Fat layer exposed Post Procedure Diagnosis Same as Pre-procedure Electronic Signature(s) Signed: 07/02/2023 4:32:13 PM By: [name] MD Entered By: [name] on 07/02/2023  07:10:47 -------------------------------------------------------------------------------- HPI Details Patient Name: Date of Service: [name] [name] Bulla K. 07/02/2023 9:00 A M Medical Record Number: [id number] Patient Account Number: [id number] Date of Birth/Sex: Treating RN: 1970/08/26 (52 y.o. M) Primary Care Provider: Barbette Reichmann Other Clinician: Referring Provider: Treating Provider/Extender: [name] Price in Treatment: [address], [place] ([id number]) [id number] Page 2 of 8 History of Present Illness HPI Description: 01/25/2023 [name] Price is a 52 year old male with a past medical history of controlled type 2 diabetes on insulin, venous insufficiency and DVT that presents to the clinic for bilateral lower extremity wounds. He states that the wound on the left was started by a bug bite and has been present for the past 4 months. The right lower extremity wound he states started out as a soft tissue infection and has been present for the past 6 years. He has been treated by a wound care center in [place] [facility] in [place]. He was last seen in the wound care center 2 years ago Could not continue following up due to lack of transportation. He states that recently the rehab center has been wrapping his legs with compression wrap and a wound dressing underneath. It is unclear when the last time this happened was. He is not wearing any compression today. He currently denies signs of infection. 7/19; patient presents for follow-up. We have been using antibiotic ointment with Hydrofera Blue under 3 layer compression to the lower extremities bilaterally. Wounds are smaller. 7/30; patient presents for follow-up. We have been using antibiotic ointment and Hydrofera Blue under 3 layer compression to the lower extremities bilaterally. Wounds are smaller. 8/6; patient presents for follow-up. We have been using  antibiotic ointment with Hydrofera Blue under 3 layer compression to the lower extremities bilaterally. Wounds are stable. He denies signs of infection. 10/1; the patient has not been here for 3 weeks. He completed his course of Apligraf to the wound on the right lower extremity. Both legs were wrapped for 3 weeks. Fortunately the area on the left lower leg is healed  however the right leg had debris on the surface and maceration but fortunately no evidence of infection. He reminds me that he had an extensive surgery on this area apparently several years ago by Dr. Lajoyce Corners for a "staph infection". He has compression stockings from elastic therapy in [place] he got last May he says they are relatively new he is hardly worn them. 8/15; patient presents for follow-up. We have been using collagen to the left lower extremity and PolyMem silver to the right lower extremity all under 3 layer compression. Wounds are slightly smaller today. Patient has been approved for Apligraf and he was agreeable to having this placed in office. 8/20; patient presents for follow-up. At last clinic visit we used Apligraf to the right lower extremity under 3 layer compression and collagen to the left lower extremity under 3 layer compression. Wounds are smaller. 8/27; Patient presents for follow-up. We have been using Apligraf to the right lower extremity wound and collagen to the left lower extremity wound under 3 layer compression. Overall wounds appear well-healing. He has no issues or complaints. 9/30; patient presents for follow-up. We have been using Apligraf to the right lower extremity wound and switch to PolyMem to the left lower extremity wound. All under 3 layer compression. Wounds are well-healing. 9/10; patient presents for follow-up. We have been using Apligraf to the right lower extremity wound and PolyMem silver to the left lower extremity wound all under 3 layer compression. Wounds are smaller. 10/8; last week  we changed the primary dressing on the large right lower leg wound in the middle of scar tissue to Hydrofera Blue. He comes in today with a surface not looking particularly healthy. ALSO he has had a reopening in the area of the original left calf wound apparently happened while he was washing off the wound area. He is put polymen silver on this and his own compression stocking since 10/15; the area on the left posterior calf is healed. The large wound on the right in the middle lips scar tissue we changed to Iodoflex I believe last week. Surface looks a lot better 10/29; the patient was hospitalized last week for a cellulitis in the submandibular region on the left. He was in hospital from 10/19 through 10/22. He feels a lot better. He says they took his wrap off and put Mepilex on the wound. He got out a week ago and that is what he has been doing at home. The wound is on the right lateral calf the area on the left posterior calf is healed as it was last time. The wound is in the middle of his surgical incision for a staph infection 11/5; wound on the right lateral leg the area on the left posterior calf remains healed. This is likely chronic venous in etiology. He also had a lot of scar in this area from surgery had by Dr. Lajoyce Corners several years ago for staph infection. He had a run of Apligraf in September. We got approved for TheraSkin although he has a lot of drainage here I think we can make any skin substitute not a viable option at the moment 11/19; wound on the right lateral leg. This is likely chronic venous in etiology although he has had previous surgery in this area by Dr. Lajoyce Corners several years ago for staph infection. We had approval for TheraSkin although he for some reason he is having too much drainage to consider that. We have been using silver alginate. The wound is measuring smaller  today with some rims of epithelialization but still moderate drainage 12/5; right lateral leg. This is a  area that has had previous surgery for an infection. It is likely chronic venous. We have had approval for TheraSkin although there is still quite a bit of drainage here. We are using gent Mupin silver alginate. Measurements are slightly smaller 12/10; right lateral lower leg. He continues to make continued progress albeit slowly. The surface of his wound looks healthy. The drainage that we were concerned about in the last 2 weeks has abated. It would be possible now to use the TheraSkin but we have to run prior approval again. Apparently his insurance fiscal year started on 06/16/2023. Currently we are using gent, mupirocin and silver alginate. As noted continued improvement 12/17; right lateral lower leg in the setting of previous extensive surgery in this area. Also may have some degree of chronic venous disease. We had approval for TheraSkin however he was having too much drainage at that time and therefore we did not use this. Currently we have been using gent mupirocin and silver alginate. The wound is made some improvements although painstakingly slowly Electronic Signature(s) Signed: 07/02/2023 4:32:13 PM By: [name] MD Entered By: [name] on 07/02/2023 07:12:03 [name] ([id number]) [id number] Page 3 of 8 -------------------------------------------------------------------------------- Physical Exam Details Patient Name: Date of Service: [name] [name] [name] Price. 07/02/2023 9:00 A M Medical Record Number: [id number] Patient Account Number: [id number] Date of Birth/Sex: Treating RN: 1971-05-25 (52 y.o. M) Primary Care Provider: Barbette Reichmann Other Clinician: Referring Provider: Treating Provider/Extender: [name] Price Weeks in Treatment: 22 Constitutional Sitting or standing Blood Pressure is within target range for patient.. Pulse regular and within target range for patient.Marland Kitchen Respirations regular, non-labored  and within target range.. Temperature is normal and within the target range for the patient.Marland Kitchen Appears in no distress. Notes Wound exam; right lateral lower leg in the middle of scar tissue from previous surgery for soft tissue infection. The granulation tissue looks quite good he continues to have thick callus around the wound margins. I removed this today with a #3 curette there was minimal bleeding some eschar removed as well. There is no evidence of surrounding infection Electronic Signature(s) Signed: 07/02/2023 4:32:13 PM By: [name] MD Entered By: [name] on 07/02/2023 07:13:40 -------------------------------------------------------------------------------- Physician Orders Details Patient Name: Date of Service: [name] [name] Bulla K. 07/02/2023 9:00 A M Medical Record Number: [id number] Patient Account Number: [id number] Date of Birth/Sex: Treating RN: Jun 11, 1971 (52 y.o. [name] Primary Care Provider: Barbette Reichmann Other Clinician: Referring Provider: Treating Provider/Extender: [name] Price Weeks in Treatment: 22 Verbal / Phone Orders: No Diagnosis Coding Follow-up Appointments ppointment in 2 weeks. - Dr. Leanord Hawking 07/18/23 (already has appt.) Return A Nurse Visit: - 12/26 Thursday Rm # 4 Dr. Lady Gary Anesthetic (In clinic) Topical Lidocaine 4% applied to wound bed Cellular or Tissue Based Products Wound #1 Right,Lateral Lower Leg Cellular or Tissue Based Product Type: - Apligraf #1 applied 02/28/23 Apligraf #2 applied 03/05/2023 Apligraf #3 applied 03/12/2023 Apligraf #4 applied 03/19/2023 Apligraf #5 applied on 03/26/2023 Other Cellular or Tissue Based Products Orders/Instructions: - RUN IVR FOR THERASKIN-05/14/23- approved 100%- have to rerun on 06/16/2023 due to insurance renews. 06/25/23 NOT doing Theraskin 07/02/23 re-run Theraskin Bathing/ Shower/ Hygiene May shower with protection but do not get wound dressing(s) wet.  Protect dressing(s) with water repellant cover (for example, large plastic bag) or a cast cover and may then take shower. - Please do  not get the legs wet. Please use cast protectors when showering. Cast protectors can be purchased from [employer], Medical supply store. Cost ranges from $17-$37 Off-Loading Other: - Keep legs elevated to heart level or above when sitting. You may place a pillow behind the calf for comfort, if so desired. Wound Treatment Wound #1 - Lower Leg Wound Laterality: Right, Lateral Cleanser: Soap and Water 1 x Per Week/30 Days Discharge Instructions: May shower and wash wound with dial antibacterial soap and water prior to dressing change. Cleanser: Vashe 5.8 (oz) 1 x Per Week/30 Days Discharge Instructions: Cleanse the wound with Vashe prior to applying a clean dressing using gauze sponges, not tissue or cotton balls. VIVAN, DEGAETANO ([id number]) [id number] Page 4 of 8 Peri-Wound Care: Triamcinolone 15 (g) 1 x Per Week/30 Days Discharge Instructions: Use triamcinolone 15 (g) as directed Peri-Wound Care: Sween Lotion (Moisturizing lotion) 1 x Per Week/30 Days Discharge Instructions: Apply moisturizing lotion as directed Topical: Gentamicin 1 x Per Week/30 Days Discharge Instructions: mix with mupirocin and apply directly to wound bed. Topical: Mupirocin Ointment 1 x Per Week/30 Days Discharge Instructions: Apply Mupirocin mix gentamicin to wound bed. Prim Dressing: Maxorb Extra Ag+ Alginate Dressing, 2x2 (in/in) 1 x Per Week/30 Days ary Discharge Instructions: Apply to wound bed as instructed Secondary Dressing: ABD Pad, 8x10 1 x Per Week/30 Days Discharge Instructions: mPad as needed the Right lateral ankle to alleviate pressure on right medial ankle.Apply over primary dressing as directed. Secondary Dressing: CarboFLEX Odor Control Dressing, 4x4 in 1 x Per Week/30 Days Discharge Instructions: Apply over primary  dressing as directed. Secondary Dressing: Woven Gauze Sponge, Non-Sterile 4x4 in 1 x Per Week/30 Days Discharge Instructions: Apply over primary dressing as directed. Secondary Dressing: Zetuvit Plus 4x8 in 1 x Per Week/30 Days Discharge Instructions: Apply over primary dressing as directed. Compression Wrap: Urgo K2 Lite, (equivalent to a 3 layer) two layer compression system, regular 1 x Per Week/30 Days Discharge Instructions: Apply Urgo K2 Lite as directed (alternative to 3 layer compression). Compression Wrap: Stockinette 1 x Per Week/30 Days Electronic Signature(s) Signed: 07/02/2023 4:32:11 PM By: Fonnie Mu RN Signed: 07/02/2023 4:32:13 PM By: [name] MD Previous Signature: 07/02/2023 9:48:40 AM Version By: Fonnie Mu RN Entered By: Fonnie Mu on 07/02/2023 07:07:31 -------------------------------------------------------------------------------- Problem List Details Patient Name: Date of Service: [name] [name] Bulla K. 07/02/2023 9:00 A M Medical Record Number: [id number] Patient Account Number: [id number] Date of Birth/Sex: Treating RN: Mar 05, 1971 (52 y.o. M) Primary Care Provider: Barbette Reichmann Other Clinician: Referring Provider: Treating Provider/Extender: [name] Price Weeks in Treatment: 22 Active Problems ICD-10 Encounter Code Description Active Date MDM Diagnosis L97.812 Non-pressure chronic ulcer of other part of right lower leg with fat layer 01/25/2023 No Yes exposed E11.622 Type 2 diabetes mellitus with other skin ulcer 01/25/2023 No Yes I87.313 Chronic venous hypertension (idiopathic) with ulcer of bilateral lower extremity 01/25/2023 No Yes CARLESS, FABRIS K ([id number]) [id number] Page 5 of 8 Inactive Problems ICD-10 Code Description Active Date Inactive Date L97.822 Non-pressure chronic ulcer of other part of left lower leg with fat layer exposed 01/25/2023 01/25/2023 Resolved  Problems Electronic Signature(s) Signed: 07/02/2023 4:32:13 PM By: [name] MD Entered By: [name] on 07/02/2023 07:09:40 -------------------------------------------------------------------------------- Progress Note Details Patient Name: Date of Service: [name] [name] Bulla K. 07/02/2023 9:00 A M Medical Record Number: [id number] Patient Account Number: [id number] Date of Birth/Sex: Treating RN: [birth year] (52 y.o. M) Primary Care Provider: Barbette Reichmann Other Clinician: Referring Provider: Treating Provider/Extender: Leanord Hawking  Kittie Plater, Vishwanath Weeks in Treatment: 22 Subjective History of Present Illness (HPI) 01/25/2023 Mr. Avon Brahmbhatt is a 52 year old male with a past medical history of controlled type 2 diabetes on insulin, venous insufficiency and DVT that presents to the clinic for bilateral lower extremity wounds. He states that the wound on the left was started by a bug bite and has been present for the past 4 months. The right lower extremity wound he states started out as a soft tissue infection and has been present for the past 6 years. He has been treated by a wound care center in [place] [facility] in [place]. He was last seen in the wound care center 2 years ago Could not continue following up due to lack of transportation. He states that recently the rehab center has been wrapping his legs with compression wrap and a wound dressing underneath. It is unclear when the last time this happened was. He is not wearing any compression today. He currently denies signs of infection. 7/19; patient presents for follow-up. We have been using antibiotic ointment with Hydrofera Blue under 3 layer compression to the lower extremities bilaterally. Wounds are smaller. 7/30; patient presents for follow-up. We have been using antibiotic ointment and Hydrofera Blue under 3 layer compression to the lower extremities bilaterally. Wounds are smaller. 8/6;  patient presents for follow-up. We have been using antibiotic ointment with Hydrofera Blue under 3 layer compression to the lower extremities bilaterally. Wounds are stable. He denies signs of infection. 10/1; the patient has not been here for 3 weeks. He completed his course of Apligraf to the wound on the right lower extremity. Both legs were wrapped for 3 weeks. Fortunately the area on the left lower leg is healed however the right leg had debris on the surface and maceration but fortunately no evidence of infection. He reminds me that he had an extensive surgery on this area apparently several years ago by Dr. Lajoyce Corners for a "staph infection". He has compression stockings from elastic therapy in [place] he got last May he says they are relatively new he is hardly worn them. 8/15; patient presents for follow-up. We have been using collagen to the left lower extremity and PolyMem silver to the right lower extremity all under 3 layer compression. Wounds are slightly smaller today. Patient has been approved for Apligraf and he was agreeable to having this placed in office. 8/20; patient presents for follow-up. At last clinic visit we used Apligraf to the right lower extremity under 3 layer compression and collagen to the left lower extremity under 3 layer compression. Wounds are smaller. 8/27; Patient presents for follow-up. We have been using Apligraf to the right lower extremity wound and collagen to the left lower extremity wound under 3 layer compression. Overall wounds appear well-healing. He has no issues or complaints. 9/30; patient presents for follow-up. We have been using Apligraf to the right lower extremity wound and switch to PolyMem to the left lower extremity wound. All under 3 layer compression. Wounds are well-healing. 9/10; patient presents for follow-up. We have been using Apligraf to the right lower extremity wound and PolyMem silver to the left lower extremity wound all under 3 layer  compression. Wounds are smaller. 10/8; last week we changed the primary dressing on the large right lower leg wound in the middle of scar tissue to Hydrofera Blue. He comes in today with a surface not looking particularly healthy. ALSO he has had a reopening in the area of the original  left calf wound apparently happened while he was washing off the wound area. He is put polymen silver on this and his own compression stocking since 10/15; the area on the left posterior calf is healed. The large wound on the right in the middle lips scar tissue we changed to Iodoflex I believe last week. Surface looks a lot better KINDELL, KREITLER K ([id number]) [id number] Page 6 of 8 10/29; the patient was hospitalized last week for a cellulitis in the submandibular region on the left. He was in hospital from 10/19 through 10/22. He feels a lot better. He says they took his wrap off and put Mepilex on the wound. He got out a week ago and that is what he has been doing at home. The wound is on the right lateral calf the area on the left posterior calf is healed as it was last time. The wound is in the middle of his surgical incision for a staph infection 11/5; wound on the right lateral leg the area on the left posterior calf remains healed. This is likely chronic venous in etiology. He also had a lot of scar in this area from surgery had by Dr. Lajoyce Corners several years ago for staph infection. He had a run of Apligraf in September. We got approved for TheraSkin although he has a lot of drainage here I think we can make any skin substitute not a viable option at the moment 11/19; wound on the right lateral leg. This is likely chronic venous in etiology although he has had previous surgery in this area by Dr. Lajoyce Corners several years ago for staph infection. We had approval for TheraSkin although he for some reason he is having too much drainage to consider that. We have been using silver alginate. The  wound is measuring smaller today with some rims of epithelialization but still moderate drainage 12/5; right lateral leg. This is a area that has had previous surgery for an infection. It is likely chronic venous. We have had approval for TheraSkin although there is still quite a bit of drainage here. We are using gent Mupin silver alginate. Measurements are slightly smaller 12/10; right lateral lower leg. He continues to make continued progress albeit slowly. The surface of his wound looks healthy. The drainage that we were concerned about in the last 2 weeks has abated. It would be possible now to use the TheraSkin but we have to run prior approval again. Apparently his insurance fiscal year started on 06/16/2023. Currently we are using gent, mupirocin and silver alginate. As noted continued improvement 12/17; right lateral lower leg in the setting of previous extensive surgery in this area. Also may have some degree of chronic venous disease. We had approval for TheraSkin however he was having too much drainage at that time and therefore we did not use this. Currently we have been using gent mupirocin and silver alginate. The wound is made some improvements although painstakingly slowly Objective Constitutional Sitting or standing Blood Pressure is within target range for patient.. Pulse regular and within target range for patient.Marland Kitchen Respirations regular, non-labored and within target range.. Temperature is normal and within the target range for the patient.Marland Kitchen Appears in no distress. Vitals Time Taken: 9:25 AM, Height: 70 in, Weight: 264 lbs, BMI: 37.9, Temperature: 98.4 F, Pulse: 66 bpm, Respiratory Rate: 18 breaths/min, Blood Pressure: 143/79 mmHg. General Notes: Wound exam; right lateral lower leg in the middle of scar tissue from previous surgery for soft tissue infection. The granulation tissue looks quite  good he continues to have thick callus around the wound margins. I removed this today with  a #3 curette there was minimal bleeding some eschar removed as well. There is no evidence of surrounding infection Integumentary (Hair, Skin) Wound #1 status is Open. Original cause of wound was Other Lesion. The date acquired was: 01/01/2018. The wound has been in treatment 22 weeks. The wound is located on the Right,Lateral Lower Leg. The wound measures 5.5cm length x 1cm width x 0.2cm depth; 4.32cm^2 area and 0.864cm^3 volume. There is Fat Layer (Subcutaneous Tissue) exposed. There is no tunneling or undermining noted. There is a medium amount of serosanguineous drainage noted. The wound margin is distinct with the outline attached to the wound base. There is large (67-100%) red, pink granulation within the wound bed. There is a small (1- 33%) amount of necrotic tissue within the wound bed including Adherent Slough. The periwound skin appearance had no abnormalities noted for moisture. The periwound skin appearance exhibited: Scarring, Hemosiderin Staining. Periwound temperature was noted as No Abnormality. Assessment Active Problems ICD-10 Non-pressure chronic ulcer of other part of right lower leg with fat layer exposed Type 2 diabetes mellitus with other skin ulcer Chronic venous hypertension (idiopathic) with ulcer of bilateral lower extremity Procedures Wound #1 Pre-procedure diagnosis of Wound #1 is a Diabetic Wound/Ulcer of the Lower Extremity located on the Right,Lateral Lower Leg .Severity of Tissue Pre Debridement is: Fat layer exposed. There was a Selective/Open Wound Skin/Epidermis Debridement with a total area of 4.32 sq cm performed by [name] Caul., MD. With the following instrument(s): Curette to remove Viable and Non-Viable tissue/material. Material removed includes Callus, Skin: Dermis, and Skin: Epidermis after achieving pain control using Lidocaine. No specimens were taken. A time out was conducted at 09:58, prior to the start of the procedure. A Minimum amount of  bleeding was controlled with Pressure. The procedure was tolerated well with a pain level of 0 throughout and a pain level of 0 following the procedure. Post Debridement Measurements: 5.5cm length x 1cm width x 0.2cm depth; 0.864cm^3 volume. Character of Wound/Ulcer Post Debridement is improved. Severity of Tissue Post Debridement is: Fat layer exposed. Post procedure Diagnosis Wound #1: Same as Pre-Procedure Pre-procedure diagnosis of Wound #1 is a Diabetic Wound/Ulcer of the Lower Extremity located on the Right,Lateral Lower Leg . There was a Three Layer Compression Therapy Procedure by Fonnie Mu, RN. Post procedure Diagnosis Wound #1: Same as Pre-Procedure LENUS, DEMATTEO ([id number]) [phone] Page 7 of 8 Plan Follow-up Appointments: Return Appointment in 2 weeks. - Dr. Leanord Hawking 07/18/23 (already has appt.) Nurse Visit: - 12/26 Thursday Rm # 4 Dr. Lady Gary Anesthetic: (In clinic) Topical Lidocaine 4% applied to wound bed Cellular or Tissue Based Products: Wound #1 Right,Lateral Lower Leg: Cellular or Tissue Based Product Type: - Apligraf #1 applied 02/28/23 Apligraf #2 applied 03/05/2023 Apligraf #3 applied 03/12/2023 Apligraf #4 applied 03/19/2023 Apligraf #5 applied on 03/26/2023 Other Cellular or Tissue Based Products Orders/Instructions: - RUN IVR FOR THERASKIN-05/14/23- approved 100%- have to rerun on 06/16/2023 due to insurance renews. 06/25/23 NOT doing Theraskin 07/02/23 re-run Theraskin Bathing/ Shower/ Hygiene: May shower with protection but do not get wound dressing(s) wet. Protect dressing(s) with water repellant cover (for example, large plastic bag) or a cast cover and may then take shower. - Please do not get the legs wet. Please use cast protectors when showering. Cast protectors can be purchased from [employer], Medical supply store. Cost ranges from $17-$37 Off-Loading: Other: - Keep legs elevated to heart  level or above  when sitting. You may place a pillow behind the calf for comfort, if so desired. WOUND #1: - Lower Leg Wound Laterality: Right, Lateral Cleanser: Soap and Water 1 x Per Week/30 Days Discharge Instructions: May shower and wash wound with dial antibacterial soap and water prior to dressing change. Cleanser: Vashe 5.8 (oz) 1 x Per Week/30 Days Discharge Instructions: Cleanse the wound with Vashe prior to applying a clean dressing using gauze sponges, not tissue or cotton balls. Peri-Wound Care: Triamcinolone 15 (g) 1 x Per Week/30 Days Discharge Instructions: Use triamcinolone 15 (g) as directed Peri-Wound Care: Sween Lotion (Moisturizing lotion) 1 x Per Week/30 Days Discharge Instructions: Apply moisturizing lotion as directed Topical: Gentamicin 1 x Per Week/30 Days Discharge Instructions: mix with mupirocin and apply directly to wound bed. Topical: Mupirocin Ointment 1 x Per Week/30 Days Discharge Instructions: Apply Mupirocin mix gentamicin to wound bed. Prim Dressing: Maxorb Extra Ag+ Alginate Dressing, 2x2 (in/in) 1 x Per Week/30 Days ary Discharge Instructions: Apply to wound bed as instructed Secondary Dressing: ABD Pad, 8x10 1 x Per Week/30 Days Discharge Instructions: mPad as needed the Right lateral ankle to alleviate pressure on right medial ankle.Apply over primary dressing as directed. Secondary Dressing: CarboFLEX Odor Control Dressing, 4x4 in 1 x Per Week/30 Days Discharge Instructions: Apply over primary dressing as directed. Secondary Dressing: Woven Gauze Sponge, Non-Sterile 4x4 in 1 x Per Week/30 Days Discharge Instructions: Apply over primary dressing as directed. Secondary Dressing: Zetuvit Plus 4x8 in 1 x Per Week/30 Days Discharge Instructions: Apply over primary dressing as directed. Com pression Wrap: Urgo K2 Lite, (equivalent to a 3 layer) two layer compression system, regular 1 x Per Week/30 Days Discharge Instructions: Apply Urgo K2 Lite as directed (alternative  to 3 layer compression). Com pression Wrap: Stockinette 1 x Per Week/30 Days 1. I continued with the same dressing topical antibiotics and silver alginate 2. I am going to try to get re- approval for the TheraSkin. Electronic Signature(s) Signed: 07/02/2023 4:32:13 PM By: [name] MD Entered By: [name] on 07/02/2023 07:15:01 -------------------------------------------------------------------------------- SuperBill Details Patient Name: Date of Service: [name] [name] Bulla K. 07/02/2023 Medical Record Number: [id number] Patient Account Number: [id number] Date of Birth/Sex: Treating RN: 01/23/1971 (52 y.o. [name] Primary Care Provider: Barbette Reichmann Other Clinician: Referring Provider: Treating Provider/Extender: [name] Price Weeks in Treatment: [address] Diagnosis Coding ICD-10 Codes GAURAV, ROHLMAN ([id number]) [id number] Page 8 of 8 Code Description [phone] Non-pressure chronic ulcer of other part of right lower leg with fat layer exposed E11.622 Type 2 diabetes mellitus with other skin ulcer I87.313 Chronic venous hypertension (idiopathic) with ulcer of bilateral lower extremity Facility Procedures : CPT4 Code: [id number] Description: 97597 - DEBRIDE WOUND 1ST 20 SQ CM OR < ICD-10 Diagnosis Description L97.812 Non-pressure chronic ulcer of other part of right lower leg with fat layer expos Modifier: ed Quantity: 1 Physician Procedures : CPT4 Code Description Modifier [id number] 97597 - WC PHYS DEBR WO ANESTH 20 SQ CM ICD-10 Diagnosis Description L97.812 Non-pressure chronic ulcer of other part of right lower leg with fat layer exposed Quantity: 1 Electronic Signature(s) Signed: 07/02/2023 4:32:13 PM By: [name] MD Entered By: [name] on 07/02/2023 07:15:12

## 2023-07-04 NOTE — Progress Notes (Signed)
VERNA, EANS ([id number]) [id number] Page 1 of 8 Visit Report for 07/02/2023 Arrival Information Details Patient Name: Date of Service: [name] 07/02/2023 9:00 A M Medical Record Number: [id number] Patient Account Number: [id number] Date of Birth/Sex: Treating RN: Mar 18, 1971 (52 y.o. M) Primary Care Doyce Stonehouse: Barbette Reichmann Other Clinician: Referring Shadaya Marschner: Treating Zayvon Alicea/Extender: Imagene Sheller in Treatment: 22 Visit Information History Since Last Visit Added or deleted any medications: No Patient Arrived: Ambulatory Any new allergies or adverse reactions: No Arrival Time: 09:18 Had a fall or experienced change in No Accompanied By: self activities of daily living that may affect Transfer Assistance: None risk of falls: Patient Identification Verified: Yes Signs or symptoms of abuse/neglect since last visito No Secondary Verification Process Completed: Yes Hospitalized since last visit: No Patient Requires Transmission-Based Precautions: No Implantable device outside of the clinic excluding No Patient Has Alerts: No cellular tissue based products placed in the center since last visit: Has Dressing in Place as Prescribed: Yes Has Compression in Place as Prescribed: Yes Pain Present Now: No Electronic Signature(s) Signed: 07/03/2023 5:28:18 PM By: Thayer Dallas Entered By: Thayer Dallas on 07/02/2023 09:25:31 -------------------------------------------------------------------------------- Compression Therapy Details Patient Name: Date of Service: [name] 07/02/2023 9:00 A M Medical Record Number: [id number] Patient Account Number: [id number] Date of Birth/Sex: Treating RN: [birth year] (52 y.o. [name] Primary Care Marra Fraga: Barbette Reichmann Other Clinician: Referring Marik Sedore: Treating Iolani Twilley/Extender: Milas Kocher Weeks in Treatment:  22 Compression Therapy Performed for Wound Assessment: Wound #1 Right,Lateral Lower Leg Performed By: Clinician Fonnie Mu, RN Compression Type: Three Layer Post Procedure Diagnosis Same as Pre-procedure Electronic Signature(s) Signed: 07/02/2023 9:50:14 AM By: Fonnie Mu RN Entered By: Fonnie Mu on 07/02/2023 09:50:14 [name] ([id number]) [id number] Page 2 of 8 -------------------------------------------------------------------------------- Encounter Discharge Information Details Patient Name: Date of Service: [name] 07/02/2023 9:00 A M Medical Record Number: [id number] Patient Account Number: [id number] Date of Birth/Sex: Treating RN: January 30, 1971 (52 y.o. [name] Primary Care Jadin Kagel: Barbette Reichmann Other Clinician: Referring Murle Hellstrom: Treating Mckaylah Bettendorf/Extender: Milas Kocher Weeks in Treatment: 22 Encounter Discharge Information Items Post Procedure Vitals Discharge Condition: Stable Temperature (F): 98.7 Ambulatory Status: Ambulatory Pulse (bpm): 74 Discharge Destination: Home Respiratory Rate (breaths/min): 17 Transportation: Private Auto Blood Pressure (mmHg): 134/74 Accompanied By: self Schedule Follow-up Appointment: Yes Clinical Summary of Care: Patient Declined Electronic Signature(s) Signed: 07/02/2023 4:32:11 PM By: Fonnie Mu RN Entered By: Fonnie Mu on 07/02/2023 10:01:28 -------------------------------------------------------------------------------- Lower Extremity Assessment Details Patient Name: Date of Service: [name] 07/02/2023 9:00 A M Medical Record Number: [id number] Patient Account Number: [id number] Date of Birth/Sex: Treating RN: 1970-11-02 (52 y.o. M) Primary Care Irish Breisch: Barbette Reichmann Other Clinician: Referring Tae Vonada: Treating Rashidi Loh/Extender: Milas Kocher Weeks in  Treatment: 22 Edema Assessment Assessed: [Left: No] [Right: No] Edema: [Left: N] [Right: o] Calf Left: Right: Point of Measurement: 34 cm From Medial Instep 32.5 cm Ankle Left: Right: Point of Measurement: 10 cm From Medial Instep 21.3 cm Vascular Assessment Extremity colors, hair growth, and conditions: Hair Growth on Extremity: [Right:No] Temperature of Extremity: [Right:Warm] Capillary Refill: [Right:< 3 seconds] Dependent Rubor: [Right:No No] Electronic Signature(s) Signed: 07/03/2023 5:28:18 PM By: Thayer Dallas Entered By: Thayer Dallas on 07/02/2023 09:31:53 [name] ([id number]) [id number] Page 3 of 8 -------------------------------------------------------------------------------- Multi Wound Chart Details Patient Name: Date of Service: [name] 07/02/2023 9:00 A M Medical Record Number: [id number] Patient Account  Number: [id number] Date of Birth/Sex: Treating RN: November 10, 1970 (52 y.o. M) Primary Care [name]: Barbette Reichmann Other Clinician: Referring Alassane Kalafut: Treating Dejon Lukas/Extender: Milas Kocher Weeks in Treatment: 22 Vital Signs Height(in): 70 Pulse(bpm): 66 Weight(lbs): 264 Blood Pressure(mmHg): 143/79 Body Mass Index(BMI): 37.9 Temperature(F): 98.4 Respiratory Rate(breaths/min): 18 [1:Photos:] [N/A:N/A] Right, Lateral Lower Leg N/A N/A Wound Location: Other Lesion N/A N/A Wounding Event: Diabetic Wound/Ulcer of the Lower N/A N/A Primary Etiology: Extremity Anemia, Deep Vein Thrombosis, N/A N/A Comorbid History: Hypertension, Peripheral Venous Disease, Type II Diabetes, Gout 01/01/2018 N/A N/A Date Acquired: 22 N/A N/A Weeks of Treatment: Open N/A N/A Wound Status: No N/A N/A Wound Recurrence: 5.5x1x0.2 N/A N/A Measurements L x W x D (cm) 4.32 N/A N/A A (cm) : rea 0.864 N/A N/A Volume (cm) : 66.90% N/A N/A % Reduction in A rea: 83.50% N/A N/A % Reduction in  Volume: Grade 2 N/A N/A Classification: Medium N/A N/A Exudate A mount: Serosanguineous N/A N/A Exudate Type: red, brown N/A N/A Exudate Color: Distinct, outline attached N/A N/A Wound Margin: Large (67-100%) N/A N/A Granulation A mount: Red, Pink N/A N/A Granulation Quality: Small (1-33%) N/A N/A Necrotic A mount: Fat Layer (Subcutaneous Tissue): Yes N/A N/A Exposed Structures: Fascia: No Tendon: No Muscle: No Joint: No Bone: No Medium (34-66%) N/A N/A Epithelialization: Debridement - Selective/Open Wound N/A N/A Debridement: Pre-procedure Verification/Time Out 09:58 N/A N/A Taken: Lidocaine N/A N/A Pain Control: Callus N/A N/A Tissue Debrided: Skin/Epidermis N/A N/A Level: 4.32 N/A N/A Debridement A (sq cm): rea Curette N/A N/A Instrument: Minimum N/A N/A Bleeding: Pressure N/A N/A Hemostasis A chieved: 0 N/A N/A Procedural Pain: 0 N/A N/A Post Procedural Pain: Procedure was tolerated well N/A N/A Debridement Treatment Response: 5.5x1x0.2 N/A N/A Post Debridement Measurements L x W x D (cm) 0.864 N/A N/A Post Debridement Volume: (cm) CARLYN, MCCOLLUM K ([id number]) [id number] Page 4 of 8 Scarring: Yes N/A N/A Periwound Skin Texture: No Abnormalities Noted N/A N/A Periwound Skin Moisture: Hemosiderin Staining: Yes N/A N/A Periwound Skin Color: No Abnormality N/A N/A Temperature: Compression Therapy N/A N/A Procedures Performed: Debridement Treatment Notes Wound #1 (Lower Leg) Wound Laterality: Right, Lateral Cleanser Soap and Water Discharge Instruction: May shower and wash wound with dial antibacterial soap and water prior to dressing change. Vashe 5.8 (oz) Discharge Instruction: Cleanse the wound with Vashe prior to applying a clean dressing using gauze sponges, not tissue or cotton balls. Peri-Wound Care Triamcinolone 15 (g) Discharge Instruction: Use triamcinolone 15 (g) as directed Sween Lotion  (Moisturizing lotion) Discharge Instruction: Apply moisturizing lotion as directed Topical Gentamicin Discharge Instruction: mix with mupirocin and apply directly to wound bed. Mupirocin Ointment Discharge Instruction: Apply Mupirocin mix gentamicin to wound bed. Primary Dressing Maxorb Extra Ag+ Alginate Dressing, 2x2 (in/in) Discharge Instruction: Apply to wound bed as instructed Secondary Dressing ABD Pad, 8x10 Discharge Instruction: mPad as needed the Right lateral ankle to alleviate pressure on right medial ankle.Apply over primary dressing as directed. CarboFLEX Odor Control Dressing, 4x4 in Discharge Instruction: Apply over primary dressing as directed. Woven Gauze Sponge, Non-Sterile 4x4 in Discharge Instruction: Apply over primary dressing as directed. Zetuvit Plus 4x8 in Discharge Instruction: Apply over primary dressing as directed. Secured With Compression Wrap Urgo K2 Lite, (equivalent to a 3 layer) two layer compression system, regular Discharge Instruction: Apply Urgo K2 Lite as directed (alternative to 3 layer compression). Stockinette Compression Stockings Facilities manager) Signed: 07/02/2023 4:32:13 PM By: [name] MD Entered By: [name] on 07/02/2023 10:09:50 -------------------------------------------------------------------------------- Multi-Disciplinary Care Plan  Details Patient Name: Date of Service: [name] 07/02/2023 9:00 A M Medical Record Number: [id number] Patient Account Number: [id number] Date of Birth/Sex: Treating RN: 10/17/1970 (52 y.o. Khyler, [place], Jamison K ([id number]) [id number] Page 5 of 8 Primary Care Tyr Franca: Barbette Reichmann Other Clinician: Referring Brion Sossamon: Treating Shatiqua Heroux/Extender: Milas Kocher Weeks in Treatment: 22 Active Inactive Wound/Skin Impairment Nursing Diagnoses: Impaired tissue  integrity Goals: Patient/caregiver will verbalize understanding of skin care regimen Date Initiated: 01/25/2023 Target Resolution Date: 09/13/2023 Goal Status: Active Interventions: Assess ulceration(s) every visit Treatment Activities: Skin care regimen initiated : 01/25/2023 Notes: Electronic Signature(s) Signed: 07/02/2023 9:48:48 AM By: Fonnie Mu RN Entered By: Fonnie Mu on 07/02/2023 09:48:47 -------------------------------------------------------------------------------- Pain Assessment Details Patient Name: Date of Service: [name] 07/02/2023 9:00 A M Medical Record Number: [id number] Patient Account Number: [id number] Date of Birth/Sex: Treating RN: [birth year] (52 y.o. M) Primary Care Cherilyn Sautter: Barbette Reichmann Other Clinician: Referring Tonio Seider: Treating Yanelli Zapanta/Extender: Milas Kocher Weeks in Treatment: 22 Active Problems Location of Pain Severity and Description of Pain Patient Has Paino No Site Locations Pain Management and Medication Current Pain Management: Electronic Signature(s) MARQUIL, MINE ([id number]) [id number] Page 6 of 8 Signed: 07/03/2023 5:28:18 PM By: Thayer Dallas Entered By: Thayer Dallas on 07/02/2023 09:25:59 -------------------------------------------------------------------------------- Patient/Caregiver Education Details Patient Name: Date of Service: [name] 12/17/2024andnbsp9:00 A M Medical Record Number: [id number] Patient Account Number: [id number] Date of Birth/Gender: Treating RN: [birth year] (52 y.o. [name] Primary Care Physician: Barbette Reichmann Other Clinician: Referring Physician: Treating Physician/Extender: Imagene Sheller in Treatment: 22 Education Assessment Education Provided To: Patient Education Topics Provided Wound/Skin Impairment: Methods: Explain/Verbal Responses:  Reinforcements needed, State content correctly Electronic Signature(s) Signed: 07/02/2023 4:32:11 PM By: Fonnie Mu RN Entered By: Fonnie Mu on 07/02/2023 09:48:59 -------------------------------------------------------------------------------- Wound Assessment Details Patient Name: Date of Service: [name] 07/02/2023 9:00 A M Medical Record Number: [id number] Patient Account Number: [id number] Date of Birth/Sex: Treating RN: [birth year] (52 y.o. M) Primary Care Ashla Murph: Barbette Reichmann Other Clinician: Referring Idalis Hoelting: Treating Larisa Lanius/Extender: Milas Kocher Weeks in Treatment: 22 Wound Status Wound Number: 1 Primary Diabetic Wound/Ulcer of the Lower Extremity Etiology: Wound Location: Right, Lateral Lower Leg Wound Open Wounding Event: Other Lesion Status: Date Acquired: 01/01/2018 Notes: Pt. 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 Weeks Of Treatment: 22 Comorbid Anemia, Deep Vein Thrombosis, Hypertension, Peripheral Venous Clustered Wound: No History: Disease, Type II Diabetes, Gout Photos FONTAINE, BEATO ([id number]) [phone].pdf Page 7 of 8 Wound Measurements Length: (cm) 5.5 Width: (cm) 1 Depth: (cm) 0.2 Area: (cm) 4.32 Volume: (cm) 0.864 % Reduction in Area: 66.9% % Reduction in Volume: 83.5% Epithelialization: Medium (34-66%) Tunneling: No Undermining: No Wound Description Classification: Grade 2 Wound Margin: Distinct, outline attached Exudate Amount: Medium Exudate Type: Serosanguineous Exudate Color: red, brown Foul Odor After Cleansing: No Slough/Fibrino Yes Wound Bed Granulation Amount: Large (67-100%) Exposed Structure Granulation Quality: Red, Pink Fascia Exposed: No Necrotic Amount: Small (1-33%) Fat Layer (Subcutaneous Tissue) Exposed: Yes Necrotic Quality: Adherent Slough Tendon Exposed: No Muscle Exposed: No Joint Exposed: No Bone Exposed:  No Periwound Skin Texture Texture Color No Abnormalities Noted: No No Abnormalities Noted: No Scarring: Yes Hemosiderin Staining: Yes Moisture Temperature / Pain No Abnormalities Noted: Yes Temperature: No Abnormality Treatment Notes Wound #1 (Lower Leg) Wound Laterality: Right, Lateral Cleanser Soap and Water Discharge Instruction: May shower and wash wound with dial antibacterial soap and water  prior to dressing change. Vashe 5.8 (oz) Discharge Instruction: Cleanse the wound with Vashe prior to applying a clean dressing using gauze sponges, not tissue or cotton balls. Peri-Wound Care Triamcinolone 15 (g) Discharge Instruction: Use triamcinolone 15 (g) as directed Sween Lotion (Moisturizing lotion) Discharge Instruction: Apply moisturizing lotion as directed Topical Gentamicin Discharge Instruction: mix with mupirocin and apply directly to wound bed. Mupirocin Ointment Discharge Instruction: Apply Mupirocin mix gentamicin to wound bed. Primary Dressing Maxorb Extra Ag+ Alginate Dressing, 2x2 (in/in) Discharge Instruction: Apply to wound bed as instructed Secondary Dressing ABD Pad, 8x10 Discharge Instruction: mPad as needed the Right lateral ankle to alleviate pressure on right medial ankle.Apply over primary dressing as TORAO, CASPER ([id number]) [id number] Page 8 of 8 directed. CarboFLEX Odor Control Dressing, 4x4 in Discharge Instruction: Apply over primary dressing as directed. Woven Gauze Sponge, Non-Sterile 4x4 in Discharge Instruction: Apply over primary dressing as directed. Zetuvit Plus 4x8 in Discharge Instruction: Apply over primary dressing as directed. Secured With Compression Wrap Urgo K2 Lite, (equivalent to a 3 layer) two layer compression system, regular Discharge Instruction: Apply Urgo K2 Lite as directed (alternative to 3 layer compression). Stockinette Compression Stockings Facilities manager) Signed:  07/03/2023 5:28:18 PM By: Thayer Dallas Entered By: Thayer Dallas on 07/02/2023 09:39:02 -------------------------------------------------------------------------------- Vitals Details Patient Name: Date of Service: [name] 07/02/2023 9:00 A M Medical Record Number: [id number] Patient Account Number: [id number] Date of Birth/Sex: Treating RN: 04/30/1971 (52 y.o. M) Primary Care Bowyn Mercier: Barbette Reichmann Other Clinician: Referring Aakash Hollomon: Treating Elwyn Lowden/Extender: Milas Kocher Weeks in Treatment: 22 Vital Signs Time Taken: 09:25 Temperature (F): 98.4 Height (in): 70 Pulse (bpm): 66 Weight (lbs): 264 Respiratory Rate (breaths/min): 18 Body Mass Index (BMI): 37.9 Blood Pressure (mmHg): 143/79 Reference Range: 80 - 120 mg / dl Electronic Signature(s) Signed: 07/03/2023 5:28:18 PM By: Thayer Dallas Entered By: Thayer Dallas on 07/02/2023 09:25:53

## 2023-07-11 ENCOUNTER — Encounter (HOSPITAL_BASED_OUTPATIENT_CLINIC_OR_DEPARTMENT_OTHER): Payer: Medicaid Other | Admitting: General Surgery

## 2023-07-11 DIAGNOSIS — I872 Venous insufficiency (chronic) (peripheral): Secondary | ICD-10-CM | POA: Diagnosis not present

## 2023-07-11 DIAGNOSIS — L97812 Non-pressure chronic ulcer of other part of right lower leg with fat layer exposed: Secondary | ICD-10-CM | POA: Diagnosis not present

## 2023-07-11 DIAGNOSIS — M109 Gout, unspecified: Secondary | ICD-10-CM | POA: Diagnosis not present

## 2023-07-11 DIAGNOSIS — Z794 Long term (current) use of insulin: Secondary | ICD-10-CM | POA: Diagnosis not present

## 2023-07-11 DIAGNOSIS — Z86718 Personal history of other venous thrombosis and embolism: Secondary | ICD-10-CM | POA: Diagnosis not present

## 2023-07-11 DIAGNOSIS — E11622 Type 2 diabetes mellitus with other skin ulcer: Secondary | ICD-10-CM | POA: Diagnosis not present

## 2023-07-11 DIAGNOSIS — I10 Essential (primary) hypertension: Secondary | ICD-10-CM | POA: Diagnosis not present

## 2023-07-11 NOTE — Progress Notes (Signed)
DAVIDLEE, BEARE ([id number]) [id number] Page 1 of 4 Visit Report for 07/11/2023 Arrival Information Details Patient Name: Date of Service: [name] 07/11/2023 8:00 A M Medical Record Number: [id number] Patient Account Number: [id number] Date of Birth/Sex: Treating RN: Jul 16, 1971 (52 y.o. Damaris Schooner Primary Care Shantanu Strauch: Barbette Reichmann Other Clinician: Referring Lorann Tani: Treating Adaley Kiene/Extender: Egbert Garibaldi Weeks in Treatment: 23 Visit Information History Since Last Visit Added or deleted any medications: No Patient Arrived: Ambulatory Any new allergies or adverse reactions: No Arrival Time: 08:34 Had a fall or experienced change in No Accompanied By: self activities of daily living that may affect Transfer Assistance: None risk of falls: Patient Identification Verified: Yes Signs or symptoms of abuse/neglect since last visito No Secondary Verification Process Completed: Yes Hospitalized since last visit: No Patient Requires Transmission-Based Precautions: No Implantable device outside of the clinic excluding No Patient Has Alerts: No cellular tissue based products placed in the center since last visit: Has Dressing in Place as Prescribed: Yes Has Compression in Place as Prescribed: Yes Pain Present Now: No Electronic Signature(s) Signed: 07/11/2023 4:22:52 PM By: Zenaida Deed RN, BSN Entered By: Zenaida Deed on 07/11/2023 05:34:39 -------------------------------------------------------------------------------- Compression Therapy Details Patient Name: Date of Service: [name] 07/11/2023 8:00 A M Medical Record Number: [id number] Patient Account Number: [id number] Date of Birth/Sex: Treating RN: 07/24/1970 (52 y.o. Damaris Schooner Primary Care Kaikoa Magro: Barbette Reichmann Other Clinician: Referring Mykael Batz: Treating Kenneth Lax/Extender: Egbert Garibaldi Weeks in Treatment: 23 Compression Therapy Performed for Wound Assessment: Wound #1 Right,Lateral Lower Leg Performed By: Clinician Zenaida Deed, RN Compression Type: Double Layer Notes Stephannie Li Electronic Signature(s) Signed: 07/11/2023 4:22:52 PM By: Zenaida Deed RN, BSN Entered By: Zenaida Deed on 07/11/2023 05:35:55 [name] ([id number]) [id number] Page 2 of 4 -------------------------------------------------------------------------------- Encounter Discharge Information Details Patient Name: Date of Service: [name] 07/11/2023 8:00 A M Medical Record Number: [id number] Patient Account Number: [id number] Date of Birth/Sex: Treating RN: 1971/07/03 (52 y.o. Damaris Schooner Primary Care Kenly Xiao: Barbette Reichmann Other Clinician: Referring Jaelyne Deeg: Treating Lenisha Lacap/Extender: Egbert Garibaldi Weeks in Treatment: 68 Encounter Discharge Information Items Discharge Condition: Stable Ambulatory Status: Ambulatory Discharge Destination: Home Transportation: Private Auto Accompanied By: self Schedule Follow-up Appointment: Yes Clinical Summary of Care: Patient Declined Electronic Signature(s) Signed: 07/11/2023 4:22:52 PM By: Zenaida Deed RN, BSN Entered By: Zenaida Deed on 07/11/2023 05:36:44 -------------------------------------------------------------------------------- Patient/Caregiver Education Details Patient Name: Date of Service: [name] 12/26/2024andnbsp8:00 A M Medical Record Number: [id number] Patient Account Number: [id number] Date of Birth/Gender: Treating RN: [birth year] (52 y.o. Damaris Schooner Primary Care Physician: Barbette Reichmann Other Clinician: Referring Physician: Treating Physician/Extender: Ernst Breach in Treatment: 35 Education Assessment Education Provided To: Patient Education Topics  Provided Venous: Methods: Explain/Verbal Responses: Reinforcements needed, State content correctly Electronic Signature(s) Signed: 07/11/2023 4:22:52 PM By: Zenaida Deed RN, BSN Entered By: Zenaida Deed on 07/11/2023 05:36:31 -------------------------------------------------------------------------------- Wound Assessment Details Patient Name: Date of Service: [name] 07/11/2023 8:00 A M Medical Record Number: [id number] Patient Account Number: [id number] Date of Birth/Sex: Treating RN: February 08, 1971 (52 y.o. Damaris Schooner Primary Care Anyela Napierkowski: Barbette Reichmann Other Clinician: Referring Wilkin Lippy: Treating Carnie Bruemmer/Extender: Egbert Garibaldi Weeks in Treatment: [address] KAMARION, [name] ([id number]) [id number] Page 3 of 4 Wound Number: 1 Primary Diabetic Wound/Ulcer of the Lower Extremity Etiology: Wound Location: Right, Lateral Lower Leg Wound Open Wounding Event: Other Lesion Status: Date  Acquired: 01/01/2018 Notes: Pt. 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 Weeks Of Treatment: 23 Comorbid Anemia, Deep Vein Thrombosis, Hypertension, Peripheral Clustered Wound: No History: Venous Disease, Type II Diabetes, Gout Wound Measurements Length: (cm) 5.5 Width: (cm) 1 Depth: (cm) 0.2 Area: (cm) 4.32 Volume: (cm) 0.864 % Reduction in Area: 66.9% % Reduction in Volume: 83.5% Epithelialization: Small (1-33%) Tunneling: No Undermining: No Wound Description Classification: Grade 2 Wound Margin: Distinct, outline attached Exudate Amount: Medium Exudate Type: Serosanguineous Exudate Color: red, brown Foul Odor After Cleansing: No Slough/Fibrino Yes Wound Bed Granulation Amount: Large (67-100%) Exposed Structure Granulation Quality: Red, Friable Fascia Exposed: No Necrotic Amount: Small (1-33%) Fat Layer (Subcutaneous Tissue) Exposed: Yes Necrotic Quality: Adherent Slough Tendon Exposed:  No Muscle Exposed: No Joint Exposed: No Bone Exposed: No Periwound Skin Texture Texture Color No Abnormalities Noted: No No Abnormalities Noted: No Scarring: Yes Hemosiderin Staining: Yes Moisture Temperature / Pain No Abnormalities Noted: Yes Temperature: No Abnormality Treatment Notes Wound #1 (Lower Leg) Wound Laterality: Right, Lateral Cleanser Soap and Water Discharge Instruction: May shower and wash wound with dial antibacterial soap and water prior to dressing change. Vashe 5.8 (oz) Discharge Instruction: Cleanse the wound with Vashe prior to applying a clean dressing using gauze sponges, not tissue or cotton balls. Peri-Wound Care Triamcinolone 15 (g) Discharge Instruction: Use triamcinolone 15 (g) as directed Sween Lotion (Moisturizing lotion) Discharge Instruction: Apply moisturizing lotion as directed Topical Gentamicin Discharge Instruction: mix with mupirocin and apply directly to wound bed. Mupirocin Ointment Discharge Instruction: Apply Mupirocin mix gentamicin to wound bed. Primary Dressing Maxorb Extra Ag+ Alginate Dressing, 2x2 (in/in) Discharge Instruction: Apply to wound bed as instructed Secondary Dressing Woven Gauze Sponge, Non-Sterile 4x4 in Discharge Instruction: Apply over primary dressing as directed. Zetuvit Plus 4x8 in Discharge Instruction: Apply over primary dressing as directed. Secured With [name] ([id number]) [id number] Page 4 of 4 Compression Wrap Urgo K2 Lite, (equivalent to a 3 layer) two layer compression system, regular Discharge Instruction: Apply Urgo K2 Lite as directed (alternative to 3 layer compression). Stockinette Compression Stockings Facilities manager) Signed: 07/11/2023 4:22:52 PM By: Zenaida Deed RN, BSN Entered By: Zenaida Deed on 07/11/2023 05:35:35

## 2023-07-11 NOTE — Progress Notes (Signed)
[name], [name] ([id number]) [id number] Page 1 of 1 Visit Report for 07/11/2023 SuperBill Details Patient Name: Date of Service: [name] 07/11/2023 Medical Record Number: [id number] Patient Account Number: [id number] Date of Birth/Sex: Treating RN: [birth year] (52 y.o. Damaris Schooner Primary Care Provider: Barbette Reichmann Other Clinician: Referring Provider: Treating Provider/Extender: Egbert Garibaldi Weeks in Treatment: 23 Diagnosis Coding ICD-10 Codes Code Description [phone] Non-pressure chronic ulcer of other part of right lower leg with fat layer exposed E11.622 Type 2 diabetes mellitus with other skin ulcer I87.313 Chronic venous hypertension (idiopathic) with ulcer of bilateral lower extremity Facility Procedures CPT4 Code Description Modifier Quantity [id number] (Facility Use Only) [phone] - APPLY MULTLAY COMPRS LWR RT LEG 1 Electronic Signature(s) Signed: 07/11/2023 8:44:25 AM By: Duanne Guess MD FACS Signed: 07/11/2023 4:22:52 PM By: Zenaida Deed RN, BSN Entered By: Zenaida Deed on 07/11/2023 05:36:54

## 2023-07-18 ENCOUNTER — Encounter (HOSPITAL_BASED_OUTPATIENT_CLINIC_OR_DEPARTMENT_OTHER): Payer: Medicaid Other | Attending: Internal Medicine | Admitting: Internal Medicine

## 2023-07-18 DIAGNOSIS — L97812 Non-pressure chronic ulcer of other part of right lower leg with fat layer exposed: Secondary | ICD-10-CM | POA: Insufficient documentation

## 2023-07-18 DIAGNOSIS — I87313 Chronic venous hypertension (idiopathic) with ulcer of bilateral lower extremity: Secondary | ICD-10-CM | POA: Insufficient documentation

## 2023-07-18 DIAGNOSIS — E11622 Type 2 diabetes mellitus with other skin ulcer: Secondary | ICD-10-CM | POA: Diagnosis not present

## 2023-07-19 NOTE — Progress Notes (Signed)
 [name], [name] ([id number]) [id number] Page 1 of 6 Visit Report for 07/18/2023 Arrival Information Details Patient Name: Date of Service: [name], NEDDO. 07/18/2023 8:45 A M Medical Record Number: [id number] Patient Account Number: [id number] Date of Birth/Sex: Treating RN: [birth year] (53 y.o. [name] Primary Care Derry Arbogast: Sadie Manna Other Clinician: Referring Quintana Canelo: Treating Timotheus Salm/Extender: Rufus Ozell Sadie Manna Devra in Treatment: 24 Visit Information History Since Last Visit Added or deleted any medications: No Patient Arrived: Ambulatory Any new allergies or adverse reactions: No Arrival Time: 08:57 Had a fall or experienced change in No Accompanied By: self activities of daily living that may affect Transfer Assistance: None risk of falls: Patient Identification Verified: Yes Signs or symptoms of abuse/neglect since last visito No Secondary Verification Process Completed: Yes Hospitalized since last visit: No Patient Requires Transmission-Based Precautions: No Implantable device outside of the clinic excluding No Patient Has Alerts: No cellular tissue based products placed in the center since last visit: Has Dressing in Place as Prescribed: Yes Has Compression in Place as Prescribed: Yes Pain Present Now: No Electronic Signature(s) Signed: 07/19/2023 12:56:04 PM By: Drury Nestle RN, BSN Entered By: Drury Price on 07/18/2023 08:58:03 -------------------------------------------------------------------------------- Compression Therapy Details Patient Name: Date of Service: [name] [name] LOISE JEANETTA K. 07/18/2023 8:45 A M Medical Record Number: [id number] Patient Account Number: [id number] Date of Birth/Sex: Treating RN: 20-Dec-1970 (53 y.o. [name] Primary Care Arjay Jaskiewicz: Sadie Manna Other Clinician: Referring Amina Menchaca: Treating Cleotha Tsang/Extender: Rufus Ozell Sadie Manna Weeks in  Treatment: 24 Compression Therapy Performed for Wound Assessment: Wound #1 Right,Lateral Lower Leg Performed By: Clinician Wyn Iha, Compression Type: Double Layer Post Procedure Diagnosis Same as Pre-procedure Electronic Signature(s) Signed: 07/19/2023 12:56:04 PM By: Drury Nestle RN, BSN Entered By: Drury Price on 07/18/2023 09:06:22 GERALENE JEANETTA POUR ([id number]) [id number] Page 2 of 6 -------------------------------------------------------------------------------- Lower Extremity Assessment Details Patient Name: Date of Service: [name] [name], Price 07/18/2023 8:45 A M Medical Record Number: [id number] Patient Account Number: [id number] Date of Birth/Sex: Treating RN: [birth year] (52 y.o. [name] Primary Care Sharece Fleischhacker: Sadie Manna Other Clinician: Referring Tasheika Kitzmiller: Treating Ladashia Demarinis/Extender: Rufus Ozell Sadie Manna Weeks in Treatment: 24 Edema Assessment Assessed: [Left: No] [Right: Yes] Edema: [Left: N] [Right: o] Calf Left: Right: Point of Measurement: 34 cm From Medial Instep 31 cm Ankle Left: Right: Point of Measurement: 10 cm From Medial Instep 21 cm Vascular Assessment Pulses: Dorsalis Pedis Palpable: [Right:Yes] Extremity colors, hair growth, and conditions: Hair Growth on Extremity: [Right:No] Temperature of Extremity: [Right:Warm] Capillary Refill: [Right:< 3 seconds] Dependent Rubor: [Right:No] Blanched when Elevated: [Right:No No] Toe Nail Assessment Left: Right: Thick: Yes Discolored: Yes Deformed: Yes Improper Length and Hygiene: No Electronic Signature(s) Signed: 07/19/2023 12:56:04 PM By: Drury Nestle RN, BSN Entered By: Drury Price on 07/18/2023 09:00:45 -------------------------------------------------------------------------------- Multi Wound Chart Details Patient Name: Date of Service: [name] [name] LOISE JEANETTA K. 07/18/2023 8:45 A M Medical Record Number: [id number] Patient Account  Number: [id number] Date of Birth/Sex: Treating RN: [birth year] (53 y.o. M) Primary Care Sihaam Chrobak: Sadie Manna Other Clinician: Referring Precious Segall: Treating Nakyia Dau/Extender: Rufus Ozell Sadie Manna Weeks in Treatment: 24 Vital Signs Height(in): 70 Pulse(bpm): 58 Weight(lbs): 264 Blood Pressure(mmHg): 156/87 Body Mass Index(BMI): 37.9 Temperature(F): 97.5 Respiratory Rate(breaths/min): 20 Sandoz, Hagan K ([id number]) [1:Photos:] [N/A:N/A] Right, Lateral Lower Leg N/A N/A Wound Location: Other Lesion N/A N/A Wounding Event: Diabetic Wound/Ulcer of the Lower N/A N/A Primary Etiology: Extremity Anemia, Deep Vein Thrombosis, N/A N/A Comorbid History: Hypertension, Peripheral Venous Disease, Type II Diabetes, Gout  01/01/2018 N/A N/A Date Acquired: 80 N/A N/A Weeks of Treatment: Open N/A N/A Wound Status: No N/A N/A Wound Recurrence: 4.9x1.3x0.1 N/A N/A Measurements L x W x D (cm) 5.003 N/A N/A A (cm) : rea 0.5 N/A N/A Volume (cm) : 61.70% N/A N/A % Reduction in A rea: 90.40% N/A N/A % Reduction in Volume: Grade 2 N/A N/A Classification: Medium N/A N/A Exudate A mount: Serosanguineous N/A N/A Exudate Type: red, brown N/A N/A Exudate Color: Distinct, outline attached N/A N/A Wound Margin: Large (67-100%) N/A N/A Granulation A mount: Red, Friable N/A N/A Granulation Quality: Small (1-33%) N/A N/A Necrotic A mount: Fat Layer (Subcutaneous Tissue): Yes N/A N/A Exposed Structures: Fascia: No Tendon: No Muscle: No Joint: No Bone: No Small (1-33%) N/A N/A Epithelialization: Scarring: Yes N/A N/A Periwound Skin Texture: Excoriation: No Induration: No Callus: No Crepitus: No Rash: No Maceration: No N/A N/A Periwound Skin Moisture: Dry/Scaly: No Hemosiderin Staining: Yes N/A N/A Periwound Skin Color: Atrophie Blanche: No Cyanosis: No Ecchymosis: No Erythema: No Mottled: No Pallor: No Rubor: No No Abnormality N/A  N/A Temperature: Compression Therapy N/A N/A Procedures Performed: Treatment Notes Electronic Signature(s) Signed: 07/18/2023 4:03:36 PM By: Rufus Sharper MD Entered By: Rufus Sharper on 07/18/2023 90:91:77 -------------------------------------------------------------------------------- Multi-Disciplinary Care Plan Details Patient Name: Date of Service: [name] [name] LOISE ANCHORS K. 07/18/2023 8:45 A M Medical Record Number: [id number] Patient Account Number: [id number] Date of Birth/Sex: Treating RN: [birth year] (53 y.o. [name] Primary Care Jeraldin Fesler: Sadie Manna Other Clinician: Referring Brannon Levene: Treating Marianny Goris/Extender: Rufus Sharper Sadie Manna Devra in Treatment: [address] Javien K ([id number]) [id number] Page 4 of 6 Active Inactive Wound/Skin Impairment Nursing Diagnoses: Impaired tissue integrity Goals: Patient/caregiver will verbalize understanding of skin care regimen Date Initiated: 01/25/2023 Target Resolution Date: 09/13/2023 Goal Status: Active Interventions: Assess ulceration(s) every visit Treatment Activities: Skin care regimen initiated : 01/25/2023 Notes: Electronic Signature(s) Signed: 07/19/2023 12:56:04 PM By: Drury Nestle RN, BSN Entered By: Drury Price on 07/18/2023 08:58:53 -------------------------------------------------------------------------------- Pain Assessment Details Patient Name: Date of Service: [name] [name] LOISE ANCHORS K. 07/18/2023 8:45 A M Medical Record Number: [id number] Patient Account Number: [id number] Date of Birth/Sex: Treating RN: 09-26-1970 (53 y.o. [name] Primary Care Jayd Cadieux: Sadie Manna Other Clinician: Referring Deundre Thong: Treating Caydin Yeatts/Extender: Rufus Sharper Sadie Manna Weeks in Treatment: 24 Active Problems Location of Pain Severity and Description of Pain Patient Has Paino No Site Locations Pain Management and Medication Current Pain  Management: Electronic Signature(s) Signed: 07/19/2023 12:56:04 PM By: Drury Nestle RN, BSN Entered By: Drury Price on 07/18/2023 08:58:10 GERALENE ANCHORS POUR ([id number]) [id number] Page 5 of 6 -------------------------------------------------------------------------------- Patient/Caregiver Education Details Patient Name: Date of Service: [name] MATE, ALEGRIA 1/2/2025andnbsp8:45 A M Medical Record Number: [id number] Patient Account Number: [id number] Date of Birth/Gender: Treating RN: 23-Dec-1970 (53 y.o. [name] Primary Care Physician: Sadie Manna Other Clinician: Referring Physician: Treating Physician/Extender: Rufus Sharper Sadie Manna Devra in Treatment: 24 Education Assessment Education Provided To: Patient Education Topics Provided Wound/Skin Impairment: Handouts: Caring for Your Ulcer Methods: Explain/Verbal Responses: Reinforcements needed Electronic Signature(s) Signed: 07/19/2023 12:56:04 PM By: Drury Nestle RN, BSN Entered By: Drury Price on 07/18/2023 08:59:20 -------------------------------------------------------------------------------- Wound Assessment Details Patient Name: Date of Service: [name] [name] LOISE ANCHORS K. 07/18/2023 8:45 A M Medical Record Number: [id number] Patient Account Number: [id number] Date of Birth/Sex: Treating RN: [birth year] (53 y.o. [name] Primary Care Lunna Vogelgesang: Sadie Manna Other Clinician: Referring Nadir Vasques: Treating Tivis Wherry/Extender: Rufus Sharper Sadie Manna Weeks in Treatment: 24 Wound Status Wound Number: 1  Primary Diabetic Wound/Ulcer of the Lower Extremity Etiology: Wound Location: Right, Lateral Lower Leg Wound Open Wounding Event: Other Lesion Status: Date Acquired: 01/01/2018 Notes: Pt. 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 Weeks Of Treatment: 24 Comorbid Anemia, Deep Vein Thrombosis, Hypertension, Peripheral Venous Clustered Wound:  No History: Disease, Type II Diabetes, Gout Photos BROGAN, MARTIS ([id number]) [phone].pdf Page 6 of 6 Wound Measurements Length: (cm) 4.9 Width: (cm) 1.3 Depth: (cm) 0.1 Area: (cm) 5.003 Volume: (cm) 0.5 % Reduction in Area: 61.7% % Reduction in Volume: 90.4% Epithelialization: Small (1-33%) Tunneling: No Undermining: No Wound Description Classification: Grade 2 Wound Margin: Distinct, outline attached Exudate Amount: Medium Exudate Type: Serosanguineous Exudate Color: red, brown Foul Odor After Cleansing: No Slough/Fibrino Yes Wound Bed Granulation Amount: Large (67-100%) Exposed Structure Granulation Quality: Red, Friable Fascia Exposed: No Necrotic Amount: Small (1-33%) Fat Layer (Subcutaneous Tissue) Exposed: Yes Necrotic Quality: Adherent Slough Tendon Exposed: No Muscle Exposed: No Joint Exposed: No Bone Exposed: No Periwound Skin Texture Texture Color No Abnormalities Noted: No No Abnormalities Noted: No Callus: No Atrophie Blanche: No Crepitus: No Cyanosis: No Excoriation: No Ecchymosis: No Induration: No Erythema: No Rash: No Hemosiderin Staining: Yes Scarring: Yes Mottled: No Pallor: No Moisture Rubor: No No Abnormalities Noted: Yes Temperature / Pain Temperature: No Abnormality Electronic Signature(s) Signed: 07/18/2023 4:27:30 PM By: Cammie Sailors RN, BSN Signed: 07/19/2023 12:56:04 PM By: Drury Nestle RN, BSN Entered By: Cammie Sailors on 07/18/2023 09:03:37 -------------------------------------------------------------------------------- Vitals Details Patient Name: Date of Service: [name] [name] LOISE JEANETTA K. 07/18/2023 8:45 A M Medical Record Number: [id number] Patient Account Number: [id number] Date of Birth/Sex: Treating RN: 1971/03/25 (53 y.o. [name] Primary Care Cleon Thoma: Sadie Manna Other Clinician: Referring Alleigh Mollica: Treating Markice Torbert/Extender: Rufus Ozell Sadie Manna Weeks in  Treatment: 24 Vital Signs Time Taken: 08:59 Temperature (F): 97.5 Height (in): 70 Pulse (bpm): 58 Weight (lbs): 264 Respiratory Rate (breaths/min): 20 Body Mass Index (BMI): 37.9 Blood Pressure (mmHg): 156/87 Reference Range: 80 - 120 mg / dl Electronic Signature(s) Signed: 07/19/2023 12:56:04 PM By: Drury Nestle RN, BSN Entered By: Drury Price on 07/18/2023 08:59:27

## 2023-07-19 NOTE — Progress Notes (Signed)
 [name], [name] ([id number]) [id number] Page 1 of 7 Visit Report for 07/18/2023 HPI Details Patient Name: Date of Service: [name], [name]. 07/18/2023 8:45 A M Medical Record Number: [id number] Patient Account Number: [id number] Date of Birth/Sex: Treating RN: February 28, 1971 (53 y.o. M) Primary Care Provider: Sadie Manna Other Clinician: Referring Provider: Treating Provider/Extender: Rufus Ozell Sadie Manna Weeks in Treatment: 24 History of Present Illness HPI Description: 01/25/2023 [name] is a 53 year old male with a past medical history of controlled type 2 diabetes on insulin , venous insufficiency and DVT that presents to the clinic for bilateral lower extremity wounds. He states that the wound on the left was started by a bug bite and has been present for the past 4 months. The right lower extremity wound he states started out as a soft tissue infection and has been present for the past 6 years. He has been treated by a wound care center in [facility] in [place]. He was last seen in the wound care center 2 years ago Could not continue following up due to lack of transportation. He states that recently the rehab center has been wrapping his legs with compression wrap and a wound dressing underneath. It is unclear when the last time this happened was. He is not wearing any compression today. He currently denies signs of infection. 7/19; patient presents for follow-up. We have been using antibiotic ointment with Hydrofera Blue under 3 layer compression to the lower extremities bilaterally. Wounds are smaller. 7/30; patient presents for follow-up. We have been using antibiotic ointment and Hydrofera Blue under 3 layer compression to the lower extremities bilaterally. Wounds are smaller. 8/6; patient presents for follow-up. We have been using antibiotic ointment with Hydrofera Blue under 3 layer compression to the  lower extremities bilaterally. Wounds are stable. He denies signs of infection. 10/1; the patient has not been here for 3 weeks. He completed his course of Apligraf to the wound on the right lower extremity. Both legs were wrapped for 3 weeks. Fortunately the area on the left lower leg is healed however the right leg had debris on the surface and maceration but fortunately no evidence of infection. He reminds me that he had an extensive surgery on this area apparently several years ago by Dr. Harden for a staph infection. He has compression stockings from elastic therapy in [place] he got last May he says they are relatively new he is hardly worn them. 8/15; patient presents for follow-up. We have been using collagen to the left lower extremity and PolyMem silver  to the right lower extremity all under 3 layer compression. Wounds are slightly smaller today. Patient has been approved for Apligraf and he was agreeable to having this placed in office. 8/20; patient presents for follow-up. At last clinic visit we used Apligraf to the right lower extremity under 3 layer compression and collagen to the left lower extremity under 3 layer compression. Wounds are smaller. 8/27; Patient presents for follow-up. We have been using Apligraf to the right lower extremity wound and collagen to the left lower extremity wound under 3 layer compression. Overall wounds appear well-healing. He has no issues or complaints. 9/30; patient presents for follow-up. We have been using Apligraf to the right lower extremity wound and switch to PolyMem to the left lower extremity wound. All under 3 layer compression. Wounds are well-healing. 9/10; patient presents for follow-up. We have been using Apligraf to the right lower extremity wound and PolyMem silver  to the left lower  extremity wound all under 3 layer compression. Wounds are smaller. 10/8; last week we changed the primary dressing on the large right lower leg wound in the  middle of scar tissue to Hydrofera Blue. He comes in today with a surface not looking particularly healthy. ALSO he has had a reopening in the area of the original left calf wound apparently happened while he was washing off the wound area. He is put polymen silver  on this and his own compression stocking since 10/15; the area on the left posterior calf is healed. The large wound on the right in the middle lips scar tissue we changed to Iodoflex I believe last week. Surface looks a lot better 10/29; the patient was hospitalized last week for a cellulitis in the submandibular region on the left. He was in hospital from 10/19 through 10/22. He feels a lot better. He says they took his wrap off and put Mepilex on the wound. He got out a week ago and that is what he has been doing at home. The wound is on the right lateral calf the area on the left posterior calf is healed as it was last time. The wound is in the middle of his surgical incision for a staph infection 11/5; wound on the right lateral leg the area on the left posterior calf remains healed. This is likely chronic venous in etiology. He also had a lot of scar in this area from surgery had by Dr. Harden several years ago for staph infection. He had a run of Apligraf in September. We got approved for TheraSkin although he has a lot of drainage here I think we can make any skin substitute not a viable option at the moment 11/19; wound on the right lateral leg. This is likely chronic venous in etiology although he has had previous surgery in this area by Dr. Harden several years ago for staph infection. We had approval for TheraSkin although he for some reason he is having too much drainage to consider that. We have been using silver  alginate. The wound is measuring smaller today with some rims of epithelialization but still moderate drainage 12/5; right lateral leg. This is a area that has had previous surgery for an infection. It is likely chronic  venous. We have had approval for TheraSkin although there is still quite a bit of drainage here. We are using gent Mupin silver  alginate. Measurements are slightly smaller 12/10; right lateral lower leg. He continues to make continued progress albeit slowly. The surface of his wound looks healthy. The drainage that we were concerned about in the last 2 weeks has abated. It would be possible now to use the TheraSkin but we have to run prior approval again. Apparently his insurance fiscal year started on 06/16/2023. Currently we are using gent, mupirocin  and silver  alginate. As noted continued improvement Mcbane, Jeovanny K ([id number]) [phone].pdf Page 2 of 7 12/17; right lateral lower leg in the setting of previous extensive surgery in this area. Also may have some degree of chronic venous disease. We had approval for TheraSkin however he was having too much drainage at that time and therefore we did not use this. Currently we have been using gent mupirocin  and silver  alginate. The wound is made some improvements although painstakingly slowly 07/18/2023; patient's wound looks a lot better. We have been using silver  alginate Zituvimet under and Urgo K2 lite. We had TheraSkin approved about 4 5 weeks ago however for a period of time he developed excessive drainage.  We have been using silver  alginate with underlying mupirocin . T oday healthy looking granulation rims of epithelialization wound is measuring smaller Electronic Signature(s) Signed: 07/18/2023 4:03:36 PM By: Rufus Sharper MD Entered By: Rufus Sharper on 07/18/2023 09:11:07 -------------------------------------------------------------------------------- Physical Exam Details Patient Name: Date of Service: [name] [name] LOISE ANCHORS K. 07/18/2023 8:45 A M Medical Record Number: [id number] Patient Account Number: [id number] Date of Birth/Sex: Treating RN: [birth year] (53 y.o. M) Primary Care Provider: Sadie Manna  Other Clinician: Referring Provider: Treating Provider/Extender: Rufus Sharper Sadie Manna Weeks in Treatment: 24 Constitutional Patient is hypertensive.. Slightly bradycardic but otherwise asymptomatic. Respirations regular, non-labored and within target range.. Temperature is normal and within the target range for the patient.SABRA Appears in no distress. Notes Wound exam; right lateral lower leg in the middle of scar tissue from previous surgery. Granulation tissue looks very healthy. Rim of epithelialization. No evidence of surrounding infection. Edema control is good Electronic Signature(s) Signed: 07/18/2023 4:03:36 PM By: Rufus Sharper MD Entered By: Rufus Sharper on 07/18/2023 09:13:21 -------------------------------------------------------------------------------- Physician Orders Details Patient Name: Date of Service: [name] [name] LOISE ANCHORS K. 07/18/2023 8:45 A M Medical Record Number: [id number] Patient Account Number: [id number] Date of Birth/Sex: Treating RN: 07/15/1971 (53 y.o. [name] Primary Care Provider: Sadie Manna Other Clinician: Referring Provider: Treating Provider/Extender: Rufus Sharper Sadie Manna Weeks in Treatment: 24 The following information was scribed by: Drury Price The information was scribed for: Rufus Sharper Verbal / Phone Orders: No Diagnosis Coding Follow-up Appointments ppointment in 1 week. - Dr. Rosan 07/25/2023 0815 (already scheduled) Return A ppointment in 2 weeks. - Dr. Rosan 08/01/2023 0930 (already scheduled) Return A Return appointment in 3 weeks. - Dr. Rosan (front office to schedule) Anesthetic (In clinic) Topical Lidocaine  4% applied to wound bed Cellular or Tissue Based Products Wound #1 Right,Lateral Lower Leg Cellular or Tissue Based Product Type: - Apligraf #1 applied 02/28/23 Apligraf #2 applied 03/05/2023 [name] ([id number]) [id number] Page 3 of  7 Apligraf #3 applied 03/12/2023 Apligraf #4 applied 03/19/2023 Apligraf #5 applied on 03/26/2023 Other Cellular or Tissue Based Products Orders/Instructions: - RUN IVR FOR THERASKIN-05/14/23- approved 100%- have to rerun on 06/16/2023 due to insurance renews. 06/25/23 NOT doing Theraskin 07/02/23 re-run Theraskin Re run IVR for Theraskin 07/18/2023 Bathing/ Shower/ Hygiene May shower with protection but do not get wound dressing(s) wet. Protect dressing(s) with water repellant cover (for example, large plastic bag) or a cast cover and may then take shower. - Please do not get the legs wet. Please use cast protectors when showering. Cast protectors can be purchased from [employer], Medical supply store. Cost ranges from $17-$37 Off-Loading Other: - Keep legs elevated to heart level or above when sitting. You may place a pillow behind the calf for comfort, if so desired. Wound Treatment Wound #1 - Lower Leg Wound Laterality: Right, Lateral Cleanser: Soap and Water 1 x Per Week/30 Days Discharge Instructions: May shower and wash wound with dial antibacterial soap and water prior to dressing change. Cleanser: Vashe 5.8 (oz) 1 x Per Week/30 Days Discharge Instructions: Cleanse the wound with Vashe prior to applying a clean dressing using gauze sponges, not tissue or cotton balls. Peri-Wound Care: Triamcinolone 15 (g) 1 x Per Week/30 Days Discharge Instructions: Use triamcinolone 15 (g) as directed Peri-Wound Care: Sween Lotion (Moisturizing lotion) 1 x Per Week/30 Days Discharge Instructions: Apply moisturizing lotion as directed Topical: Gentamicin 1 x Per Week/30 Days Discharge Instructions: mix with mupirocin  and apply directly to wound bed. Topical: Mupirocin  Ointment 1 x  Per Week/30 Days Discharge Instructions: Apply Mupirocin  mix gentamicin to wound bed. Prim Dressing: Maxorb Extra Ag+ Alginate Dressing, 2x2 (in/in) 1 x Per Week/30 Days ary Discharge Instructions: Apply to  wound bed as instructed Secondary Dressing: ABD Pad, 8x10 1 x Per Week/30 Days Discharge Instructions: mPad as needed the Right lateral ankle to alleviate pressure on right medial ankle.Apply over primary dressing as directed. Secondary Dressing: CarboFLEX Odor Control Dressing, 4x4 in 1 x Per Week/30 Days Discharge Instructions: Apply over primary dressing as directed. Secondary Dressing: Woven Gauze Sponge, Non-Sterile 4x4 in 1 x Per Week/30 Days Discharge Instructions: Apply over primary dressing as directed. Secondary Dressing: Zetuvit Plus 4x8 in 1 x Per Week/30 Days Discharge Instructions: Apply over primary dressing as directed. Compression Wrap: Urgo K2 Lite, (equivalent to a 3 layer) two layer compression system, regular 1 x Per Week/30 Days Discharge Instructions: Apply Urgo K2 Lite as directed (alternative to 3 layer compression). Compression Wrap: Stockinette 1 x Per Week/30 Days Electronic Signature(s) Signed: 07/18/2023 4:03:36 PM By: Rufus Sharper MD Signed: 07/19/2023 12:56:04 PM By: Drury Nestle RN, BSN Entered By: Drury Price on 07/18/2023 09:08:03 -------------------------------------------------------------------------------- Problem List Details Patient Name: Date of Service: [name] [name] LOISE ANCHORS K. 07/18/2023 8:45 A M Medical Record Number: [id number] Patient Account Number: [id number] Date of Birth/Sex: Treating RN: [birth year] (52 y.o. [name] NO, ANCHORS Price ([id number]) [id number] Page 4 of 7 Primary Care Provider: Sadie Manna Other Clinician: Referring Provider: Treating Provider/Extender: Rufus Sharper Sadie Manna Devra in Treatment: 24 Active Problems ICD-10 Encounter Code Description Active Date MDM Diagnosis L97.812 Non-pressure chronic ulcer of other part of right lower leg with fat layer 01/25/2023 No Yes exposed E11.622 Type 2 diabetes mellitus with other skin ulcer 01/25/2023 No Yes I87.313 Chronic venous  hypertension (idiopathic) with ulcer of bilateral lower extremity 01/25/2023 No Yes Inactive Problems ICD-10 Code Description Active Date Inactive Date L97.822 Non-pressure chronic ulcer of other part of left lower leg with fat layer exposed 01/25/2023 01/25/2023 Resolved Problems Electronic Signature(s) Signed: 07/18/2023 4:03:36 PM By: Rufus Sharper MD Entered By: Rufus Sharper on 07/18/2023 09:08:16 -------------------------------------------------------------------------------- Progress Note Details Patient Name: Date of Service: [name] [name] LOISE ANCHORS K. 07/18/2023 8:45 A M Medical Record Number: [id number] Patient Account Number: [id number] Date of Birth/Sex: Treating RN: [birth year] (53 y.o. M) Primary Care Provider: Sadie Manna Other Clinician: Referring Provider: Treating Provider/Extender: Rufus Sharper Sadie Manna Weeks in Treatment: 24 Subjective History of Present Illness (HPI) 01/25/2023 Mr. Damani [name] is a 53 year old male with a past medical history of controlled type 2 diabetes on insulin , venous insufficiency and DVT that presents to the clinic for bilateral lower extremity wounds. He states that the wound on the left was started by a bug bite and has been present for the past 4 months. The right lower extremity wound he states started out as a soft tissue infection and has been present for the past 6 years. He has been treated by a wound care center in [facility] in [place]. He was last seen in the wound care center 2 years ago Could not continue following up due to lack of transportation. He states that recently the rehab center has been wrapping his legs with compression wrap and a wound dressing underneath. It is unclear when the last time this happened was. He is not wearing any compression today. He currently denies signs of infection. 7/19; patient presents for follow-up. We have been using antibiotic ointment with Hydrofera Blue  under 3 layer compression to the  lower extremities bilaterally. Wounds are smaller. 7/30; patient presents for follow-up. We have been using antibiotic ointment and Hydrofera Blue under 3 layer compression to the lower extremities bilaterally. Wounds are smaller. 8/6; patient presents for follow-up. We have been using antibiotic ointment with Hydrofera Blue under 3 layer compression to the lower extremities bilaterally. Wounds are stable. He denies signs of infection. [name], [name] ([id number]) [id number] Page 5 of 7 10/1; the patient has not been here for 3 weeks. He completed his course of Apligraf to the wound on the right lower extremity. Both legs were wrapped for 3 weeks. Fortunately the area on the left lower leg is healed however the right leg had debris on the surface and maceration but fortunately no evidence of infection. He reminds me that he had an extensive surgery on this area apparently several years ago by Dr. Harden for a staph infection. He has compression stockings from elastic therapy in [place] he got last May he says they are relatively new he is hardly worn them. 8/15; patient presents for follow-up. We have been using collagen to the left lower extremity and PolyMem silver  to the right lower extremity all under 3 layer compression. Wounds are slightly smaller today. Patient has been approved for Apligraf and he was agreeable to having this placed in office. 8/20; patient presents for follow-up. At last clinic visit we used Apligraf to the right lower extremity under 3 layer compression and collagen to the left lower extremity under 3 layer compression. Wounds are smaller. 8/27; Patient presents for follow-up. We have been using Apligraf to the right lower extremity wound and collagen to the left lower extremity wound under 3 layer compression. Overall wounds appear well-healing. He has no issues or complaints. 9/30; patient presents for  follow-up. We have been using Apligraf to the right lower extremity wound and switch to PolyMem to the left lower extremity wound. All under 3 layer compression. Wounds are well-healing. 9/10; patient presents for follow-up. We have been using Apligraf to the right lower extremity wound and PolyMem silver  to the left lower extremity wound all under 3 layer compression. Wounds are smaller. 10/8; last week we changed the primary dressing on the large right lower leg wound in the middle of scar tissue to Hydrofera Blue. He comes in today with a surface not looking particularly healthy. ALSO he has had a reopening in the area of the original left calf wound apparently happened while he was washing off the wound area. He is put polymen silver  on this and his own compression stocking since 10/15; the area on the left posterior calf is healed. The large wound on the right in the middle lips scar tissue we changed to Iodoflex I believe last week. Surface looks a lot better 10/29; the patient was hospitalized last week for a cellulitis in the submandibular region on the left. He was in hospital from 10/19 through 10/22. He feels a lot better. He says they took his wrap off and put Mepilex on the wound. He got out a week ago and that is what he has been doing at home. The wound is on the right lateral calf the area on the left posterior calf is healed as it was last time. The wound is in the middle of his surgical incision for a staph infection 11/5; wound on the right lateral leg the area on the left posterior calf remains healed. This is likely chronic venous in etiology. He also had a lot of scar  in this area from surgery had by Dr. Harden several years ago for staph infection. He had a run of Apligraf in September. We got approved for TheraSkin although he has a lot of drainage here I think we can make any skin substitute not a viable option at the moment 11/19; wound on the right lateral leg. This is likely  chronic venous in etiology although he has had previous surgery in this area by Dr. Harden several years ago for staph infection. We had approval for TheraSkin although he for some reason he is having too much drainage to consider that. We have been using silver  alginate. The wound is measuring smaller today with some rims of epithelialization but still moderate drainage 12/5; right lateral leg. This is a area that has had previous surgery for an infection. It is likely chronic venous. We have had approval for TheraSkin although there is still quite a bit of drainage here. We are using gent Mupin silver  alginate. Measurements are slightly smaller 12/10; right lateral lower leg. He continues to make continued progress albeit slowly. The surface of his wound looks healthy. The drainage that we were concerned about in the last 2 weeks has abated. It would be possible now to use the TheraSkin but we have to run prior approval again. Apparently his insurance fiscal year started on 06/16/2023. Currently we are using gent, mupirocin  and silver  alginate. As noted continued improvement 12/17; right lateral lower leg in the setting of previous extensive surgery in this area. Also may have some degree of chronic venous disease. We had approval for TheraSkin however he was having too much drainage at that time and therefore we did not use this. Currently we have been using gent mupirocin  and silver  alginate. The wound is made some improvements although painstakingly slowly 07/18/2023; patient's wound looks a lot better. We have been using silver  alginate Zituvimet under and Urgo K2 lite. We had TheraSkin approved about 4 5 weeks ago however for a period of time he developed excessive drainage. We have been using silver  alginate with underlying mupirocin . T oday healthy looking granulation rims of epithelialization wound is measuring smaller Objective Constitutional Patient is hypertensive.. Slightly bradycardic but  otherwise asymptomatic. Respirations regular, non-labored and within target range.. Temperature is normal and within the target range for the patient.SABRA Appears in no distress. Vitals Time Taken: 8:59 AM, Height: 70 in, Weight: 264 lbs, BMI: 37.9, Temperature: 97.5 F, Pulse: 58 bpm, Respiratory Rate: 20 breaths/min, Blood Pressure: 156/87 mmHg. General Notes: Wound exam; right lateral lower leg in the middle of scar tissue from previous surgery. Granulation tissue looks very healthy. Rim of epithelialization. No evidence of surrounding infection. Edema control is good Integumentary (Hair, Skin) Wound #1 status is Open. Original cause of wound was Other Lesion. The date acquired was: 01/01/2018. The wound has been in treatment 24 weeks. The wound is located on the Right,Lateral Lower Leg. The wound measures 4.9cm length x 1.3cm width x 0.1cm depth; 5.003cm^2 area and 0.5cm^3 volume. There is Fat Layer (Subcutaneous Tissue) exposed. There is no tunneling or undermining noted. There is a medium amount of serosanguineous drainage noted. The wound margin is distinct with the outline attached to the wound base. There is large (67-100%) red, friable granulation within the wound bed. There is a small (1- 33%) amount of necrotic tissue within the wound bed including Adherent Slough. The periwound skin appearance had no abnormalities noted for moisture. The periwound skin appearance exhibited: Scarring, Hemosiderin Staining. The periwound skin  appearance did not exhibit: Callus, Crepitus, Excoriation, Induration, Rash, Atrophie Blanche, Cyanosis, Ecchymosis, Mottled, Pallor, Rubor, Erythema. Periwound temperature was noted as No Abnormality. Assessment [name], [name] ([id number]) [id number] Page 6 of 7 Active Problems ICD-10 Non-pressure chronic ulcer of other part of right lower leg with fat layer exposed Type 2 diabetes mellitus with other skin ulcer Chronic venous  hypertension (idiopathic) with ulcer of bilateral lower extremity Procedures Wound #1 Pre-procedure diagnosis of Wound #1 is a Diabetic Wound/Ulcer of the Lower Extremity located on the Right,Lateral Lower Leg . There was a Double Layer Compression Therapy Procedure by Wyn Iha. Post procedure Diagnosis Wound #1: Same as Pre-Procedure Plan Follow-up Appointments: Return Appointment in 1 week. - Dr. Rosan 07/25/2023 0815 (already scheduled) Return Appointment in 2 weeks. - Dr. Rosan 08/01/2023 0930 (already scheduled) Return appointment in 3 weeks. - Dr. Rosan (front office to schedule) Anesthetic: (In clinic) Topical Lidocaine  4% applied to wound bed Cellular or Tissue Based Products: Wound #1 Right,Lateral Lower Leg: Cellular or Tissue Based Product Type: - Apligraf #1 applied 02/28/23 Apligraf #2 applied 03/05/2023 Apligraf #3 applied 03/12/2023 Apligraf #4 applied 03/19/2023 Apligraf #5 applied on 03/26/2023 Other Cellular or Tissue Based Products Orders/Instructions: - RUN IVR FOR THERASKIN-05/14/23- approved 100%- have to rerun on 06/16/2023 due to insurance renews. 06/25/23 NOT doing Theraskin 07/02/23 re-run Theraskin Re run IVR for Theraskin 07/18/2023 Bathing/ Shower/ Hygiene: May shower with protection but do not get wound dressing(s) wet. Protect dressing(s) with water repellant cover (for example, large plastic bag) or a cast cover and may then take shower. - Please do not get the legs wet. Please use cast protectors when showering. Cast protectors can be purchased from [employer], Medical supply store. Cost ranges from $17-$37 Off-Loading: Other: - Keep legs elevated to heart level or above when sitting. You may place a pillow behind the calf for comfort, if so desired. WOUND #1: - Lower Leg Wound Laterality: Right, Lateral Cleanser: Soap and Water 1 x Per Week/30 Days Discharge Instructions: May shower and wash wound with dial antibacterial soap and water  prior to dressing change. Cleanser: Vashe 5.8 (oz) 1 x Per Week/30 Days Discharge Instructions: Cleanse the wound with Vashe prior to applying a clean dressing using gauze sponges, not tissue or cotton balls. Peri-Wound Care: Triamcinolone 15 (g) 1 x Per Week/30 Days Discharge Instructions: Use triamcinolone 15 (g) as directed Peri-Wound Care: Sween Lotion (Moisturizing lotion) 1 x Per Week/30 Days Discharge Instructions: Apply moisturizing lotion as directed Topical: Gentamicin 1 x Per Week/30 Days Discharge Instructions: mix with mupirocin  and apply directly to wound bed. Topical: Mupirocin  Ointment 1 x Per Week/30 Days Discharge Instructions: Apply Mupirocin  mix gentamicin to wound bed. Prim Dressing: Maxorb Extra Ag+ Alginate Dressing, 2x2 (in/in) 1 x Per Week/30 Days ary Discharge Instructions: Apply to wound bed as instructed Secondary Dressing: ABD Pad, 8x10 1 x Per Week/30 Days Discharge Instructions: mPad as needed the Right lateral ankle to alleviate pressure on right medial ankle.Apply over primary dressing as directed. Secondary Dressing: CarboFLEX Odor Control Dressing, 4x4 in 1 x Per Week/30 Days Discharge Instructions: Apply over primary dressing as directed. Secondary Dressing: Woven Gauze Sponge, Non-Sterile 4x4 in 1 x Per Week/30 Days Discharge Instructions: Apply over primary dressing as directed. Secondary Dressing: Zetuvit Plus 4x8 in 1 x Per Week/30 Days Discharge Instructions: Apply over primary dressing as directed. Com pression Wrap: Urgo K2 Lite, (equivalent to a 3 layer) two layer compression system, regular 1 x Per Week/30 Days Discharge Instructions: Apply Urgo  K2 Lite as directed (alternative to 3 layer compression). Com pression Wrap: Stockinette 1 x Per Week/30 Days 1. Topical gentamicin mupirocin  to continue with silver  alginate ABD and an Urgo K2 lite 2. He has Medicaid. We will put TheraSkin back through his insurance into the new year in case this  stalls. I think the major issue here has been surrounding scar tissue Electronic Signature(s) Signed: 07/18/2023 4:03:36 PM By: Rufus Sharper MD Entered By: Rufus Sharper on 07/18/2023 09:15:20 [name] ([id number]) [id number] Page 7 of 7 -------------------------------------------------------------------------------- SuperBill Details Patient Name: Date of Service: [name] [name], [name] 07/18/2023 Medical Record Number: [id number] Patient Account Number: [id number] Date of Birth/Sex: Treating RN: 03/08/1971 (53 y.o. [name] Primary Care Provider: Sadie Manna Other Clinician: Referring Provider: Treating Provider/Extender: Rufus Sharper Sadie Manna Weeks in Treatment: 24 Diagnosis Coding ICD-10 Codes Code Description [phone] Non-pressure chronic ulcer of other part of right lower leg with fat layer exposed E11.622 Type 2 diabetes mellitus with other skin ulcer I87.313 Chronic venous hypertension (idiopathic) with ulcer of bilateral lower extremity Facility Procedures : CPT4 Code: 63899838 Description: (Facility Use Only) [phone] - APPLY MULTLAY COMPRS LWR RT LEG Modifier: Quantity: 1 Physician Procedures : CPT4 Code Description Modifier [id number] - WC PHYS LEVEL 3 - EST PT ICD-10 Diagnosis Description L97.812 Non-pressure chronic ulcer of other part of right lower leg with fat layer exposed E11.622 Type 2 diabetes mellitus with other skin ulcer  I87.313 Chronic venous hypertension (idiopathic) with ulcer of bilateral lower extremity Quantity: 1 Electronic Signature(s) Signed: 07/18/2023 4:03:36 PM By: Rufus Sharper MD Entered By: Rufus Sharper on 07/18/2023 09:15:38

## 2023-07-25 ENCOUNTER — Encounter (HOSPITAL_BASED_OUTPATIENT_CLINIC_OR_DEPARTMENT_OTHER): Payer: Medicaid Other | Admitting: Internal Medicine

## 2023-07-25 DIAGNOSIS — L97812 Non-pressure chronic ulcer of other part of right lower leg with fat layer exposed: Secondary | ICD-10-CM

## 2023-07-25 DIAGNOSIS — E11622 Type 2 diabetes mellitus with other skin ulcer: Secondary | ICD-10-CM | POA: Diagnosis not present

## 2023-07-25 DIAGNOSIS — I87313 Chronic venous hypertension (idiopathic) with ulcer of bilateral lower extremity: Secondary | ICD-10-CM

## 2023-07-31 NOTE — Progress Notes (Signed)
 LIDO, MASKE ([id number]) [id number] Page 1 of 8 Visit Report for 07/25/2023 Arrival Information Details Patient Name: Date of Service: HA DAKARI, CREGGER. 07/25/2023 8:15 A M Medical Record Number: [id number] Patient Account Number: [id number] Date of Birth/Sex: Treating RN: 26-Nov-1970 (53 y.o. NETTY Cammie Sailors Primary Care Anthonia Monger: Sadie Manna Other Clinician: Referring Corben Auzenne: Treating Lilya Smitherman/Extender: Rosan Harlene Sadie Manna Weeks in Treatment: 25 Visit Information History Since Last Visit Added or deleted any medications: No Patient Arrived: Ambulatory Any new allergies or adverse reactions: No Arrival Time: 08:35 Had a fall or experienced change in No Accompanied By: self activities of daily living that may affect Transfer Assistance: None risk of falls: Patient Identification Verified: Yes Signs or symptoms of abuse/neglect since last visito No Secondary Verification Process Completed: Yes Hospitalized since last visit: No Patient Requires Transmission-Based Precautions: No Implantable device outside of the clinic excluding No Patient Has Alerts: No cellular tissue based products placed in the center since last visit: Has Dressing in Place as Prescribed: Yes Has Compression in Place as Prescribed: Yes Pain Present Now: No Electronic Signature(s) Signed: 07/25/2023 5:11:58 PM By: Cammie Sailors RN, BSN Entered By: Cammie Sailors on 07/25/2023 08:35:40 -------------------------------------------------------------------------------- Compression Therapy Details Patient Name: Date of Service: TWANA KATTIE LOISE JEANETTA K. 07/25/2023 8:15 A M Medical Record Number: [id number] Patient Account Number: [id number] Date of Birth/Sex: Treating RN: 01-21-1971 (53 y.o. NETTY Cammie Sailors Primary Care Roxie Gueye: Sadie Manna Other Clinician: Referring Adayah Arocho: Treating Brittony Billick/Extender: Rosan Harlene Sadie Manna Weeks in  Treatment: 25 Compression Therapy Performed for Wound Assessment: Wound #1 Right,Lateral Lower Leg Performed By: Clinician Cammie Sailors, RN Compression Type: Three Layer Post Procedure Diagnosis Same as Pre-procedure Electronic Signature(s) Signed: 07/25/2023 5:11:58 PM By: Cammie Sailors RN, BSN Entered By: Cammie Sailors on 07/25/2023 08:51:58 GERALENE JEANETTA POUR ([id number]) [id number] Page 2 of 8 -------------------------------------------------------------------------------- Encounter Discharge Information Details Patient Name: Date of Service: HA KRISTEN, BUSHWAY 07/25/2023 8:15 A M Medical Record Number: [id number] Patient Account Number: [id number] Date of Birth/Sex: Treating RN: [birth year] (53 y.o. NETTY Cammie Sailors Primary Care Jerzey Komperda: Sadie Manna Other Clinician: Referring Charlea Nardo: Treating Yamilet Mcfayden/Extender: Rosan Harlene Sadie Manna Weeks in Treatment: 25 Encounter Discharge Information Items Post Procedure Vitals Discharge Condition: Stable Temperature (F): 97.8 Ambulatory Status: Ambulatory Pulse (bpm): 59 Discharge Destination: Home Respiratory Rate (breaths/min): 18 Transportation: Private Auto Blood Pressure (mmHg): 155/93 Accompanied By: self Schedule Follow-up Appointment: Yes Clinical Summary of Care: Patient Declined Electronic Signature(s) Signed: 07/25/2023 5:11:58 PM By: Cammie Sailors RN, BSN Entered By: Cammie Sailors on 07/25/2023 08:52:48 -------------------------------------------------------------------------------- Lower Extremity Assessment Details Patient Name: Date of Service: [name] 07/25/2023 8:15 A M Medical Record Number: [id number] Patient Account Number: [id number] Date of Birth/Sex: Treating RN: 12-01-1970 (53 y.o. NETTY Cammie Sailors Primary Care Salem Mastrogiovanni: Sadie Manna Other Clinician: Referring Haddie Bruhl: Treating Melenda Bielak/Extender: Rosan Harlene Sadie Manna Weeks in Treatment: 25 Edema Assessment Assessed: [Left: No] [Right: No] Edema: [Left: N] [Right: o] Calf Left: Right: Point of Measurement: 34 cm From Medial Instep 32.5 cm Ankle Left: Right: Point of Measurement: 10 cm From Medial Instep 21.5 cm Vascular Assessment Pulses: Dorsalis Pedis Palpable: [Right:Yes] Extremity colors, hair growth, and conditions: Hair Growth on Extremity: [Right:No] Temperature of Extremity: [Right:Warm] Capillary Refill: [Right:< 3 seconds] Dependent Rubor: [Right:No No] Electronic Signature(s) Signed: 07/25/2023 5:11:58 PM By: Cammie Sailors RN, BSN Entered By: Cammie Sailors on 07/25/2023 08:36:44 GERALENE JEANETTA POUR ([id number]) [id number] Page 3 of 8 -------------------------------------------------------------------------------- Multi Wound Chart Details Patient Name: Date of Service:  HA JERIMAH, WITUCKI K. 07/25/2023 8:15 A M Medical Record Number: [id number] Patient Account Number: [id number] Date of Birth/Sex: Treating RN: [birth year] (53 y.o. M) Primary Care Lemoyne Nestor: Sadie Manna Other Clinician: Referring Deval Mroczka: Treating Sydnee Lamour/Extender: Rosan Harlene Sadie, Vishwanath Weeks in Treatment: 25 Vital Signs Height(in): 70 Pulse(bpm): 59 Weight(lbs): 264 Blood Pressure(mmHg): 155/93 Body Mass Index(BMI): 37.9 Temperature(F): 97.8 Respiratory Rate(breaths/min): 18 [1:Photos:] [N/A:N/A] Right, Lateral Lower Leg N/A N/A Wound Location: Other Lesion N/A N/A Wounding Event: Diabetic Wound/Ulcer of the Lower N/A N/A Primary Etiology: Extremity Anemia, Deep Vein Thrombosis, N/A N/A Comorbid History: Hypertension, Peripheral Venous Disease, Type II Diabetes, Gout 01/01/2018 N/A N/A Date Acquired: 25 N/A N/A Weeks of Treatment: Open N/A N/A Wound Status: No N/A N/A Wound Recurrence: 5x1.8x0.1 N/A N/A Measurements L x W x D (cm) 7.069 N/A N/A A (cm) : rea 0.707 N/A N/A Volume (cm)  : 45.90% N/A N/A % Reduction in A rea: 86.50% N/A N/A % Reduction in Volume: Grade 2 N/A N/A Classification: Medium N/A N/A Exudate A mount: Serosanguineous N/A N/A Exudate Type: red, brown N/A N/A Exudate Color: Distinct, outline attached N/A N/A Wound Margin: Large (67-100%) N/A N/A Granulation A mount: Red, Friable N/A N/A Granulation Quality: Small (1-33%) N/A N/A Necrotic A mount: Fat Layer (Subcutaneous Tissue): Yes N/A N/A Exposed Structures: Fascia: No Tendon: No Muscle: No Joint: No Bone: No Small (1-33%) N/A N/A Epithelialization: Debridement - Selective/Open Wound N/A N/A Debridement: Pre-procedure Verification/Time Out 08:47 N/A N/A Taken: Lidocaine  4% Topical Solution N/A N/A Pain Control: Slough N/A N/A Tissue Debrided: Non-Viable Tissue N/A N/A Level: 7.06 N/A N/A Debridement A (sq cm): rea Curette N/A N/A Instrument: Minimum N/A N/A Bleeding: Pressure N/A N/A Hemostasis A chieved: Procedure was tolerated well N/A N/A Debridement Treatment Response: 5x1.8x0.1 N/A N/A Post Debridement Measurements L x W x D (cm) 0.707 N/A N/A Post Debridement Volume: (cm) Scarring: Yes N/A N/A Periwound Skin Texture: Excoriation: No IVOR, KISHI K ([id number]) [id number] Page 4 of 8 Induration: No Callus: No Crepitus: No Rash: No Maceration: No N/A N/A Periwound Skin Moisture: Dry/Scaly: No Hemosiderin Staining: Yes N/A N/A Periwound Skin Color: Atrophie Blanche: No Cyanosis: No Ecchymosis: No Erythema: No Mottled: No Pallor: No Rubor: No No Abnormality N/A N/A Temperature: Compression Therapy N/A N/A Procedures Performed: Debridement Treatment Notes Wound #1 (Lower Leg) Wound Laterality: Right, Lateral Cleanser Soap and Water Discharge Instruction: May shower and wash wound with dial antibacterial soap and water prior to dressing change. Vashe 5.8 (oz) Discharge Instruction: Cleanse the wound with  Vashe prior to applying a clean dressing using gauze sponges, not tissue or cotton balls. Peri-Wound Care Triamcinolone 15 (g) Discharge Instruction: Use triamcinolone 15 (g) as directed Sween Lotion (Moisturizing lotion) Discharge Instruction: Apply moisturizing lotion as directed Topical Gentamicin Discharge Instruction: mix with mupirocin  and apply directly to wound bed. Mupirocin  Ointment Discharge Instruction: Apply Mupirocin  mix gentamicin to wound bed. Primary Dressing Maxorb Extra Ag+ Alginate Dressing, 2x2 (in/in) Discharge Instruction: Apply to wound bed as instructed Secondary Dressing ABD Pad, 8x10 Discharge Instruction: mPad as needed the Right lateral ankle to alleviate pressure on right medial ankle.Apply over primary dressing as directed. CarboFLEX Odor Control Dressing, 4x4 in Discharge Instruction: Apply over primary dressing as directed. Woven Gauze Sponge, Non-Sterile 4x4 in Discharge Instruction: Apply over primary dressing as directed. Zetuvit Plus 4x8 in Discharge Instruction: Apply over primary dressing as directed. Secured With Compression Wrap Urgo K2 Lite, (equivalent to a 3 layer) two layer compression system, regular Discharge Instruction: Apply Urgo  K2 Lite as directed (alternative to 3 layer compression). Stockinette Compression Stockings Add-Ons Electronic Signature(s) Signed: 07/30/2023 12:04:19 PM By: Rosan Raisin DO Entered By: Rosan Raisin on 07/25/2023 09:04:00 GERALENE JEANETTA POUR ([id number]) [id number] Page 5 of 8 -------------------------------------------------------------------------------- Multi-Disciplinary Care Plan Details Patient Name: Date of Service: HA ASHWIN, TIBBS. 07/25/2023 8:15 A M Medical Record Number: [id number] Patient Account Number: [id number] Date of Birth/Sex: Treating RN: 1971-05-14 (53 y.o. NETTY Cammie Sailors Primary Care Haydee Jabbour: Sadie Manna Other Clinician: Referring  Oval Cavazos: Treating Ryn Peine/Extender: Rosan Raisin Sadie Manna Weeks in Treatment: 25 Active Inactive Wound/Skin Impairment Nursing Diagnoses: Impaired tissue integrity Goals: Patient/caregiver will verbalize understanding of skin care regimen Date Initiated: 01/25/2023 Target Resolution Date: 09/13/2023 Goal Status: Active Interventions: Assess ulceration(s) every visit Treatment Activities: Skin care regimen initiated : 01/25/2023 Notes: Electronic Signature(s) Signed: 07/25/2023 5:11:58 PM By: Cammie Sailors RN, BSN Entered By: Cammie Sailors on 07/25/2023 08:46:59 -------------------------------------------------------------------------------- Pain Assessment Details Patient Name: Date of Service: TWANA KATTIE LOISE JEANETTA K. 07/25/2023 8:15 A M Medical Record Number: [id number] Patient Account Number: [id number] Date of Birth/Sex: Treating RN: 12-Apr-1971 (53 y.o. NETTY Cammie Sailors Primary Care Adarius Tigges: Sadie Manna Other Clinician: Referring Adali Pennings: Treating Bryton Waight/Extender: Rosan Raisin Sadie Manna Weeks in Treatment: 25 Active Problems Location of Pain Severity and Description of Pain Patient Has Paino No Site Locations [place], [name] ([id number]) [id number] Page 6 of 8 Pain Management and Medication Current Pain Management: Electronic Signature(s) Signed: 07/25/2023 5:11:58 PM By: Cammie Sailors RN, BSN Entered By: Cammie Sailors on 07/25/2023 08:36:09 -------------------------------------------------------------------------------- Patient/Caregiver Education Details Patient Name: Date of Service: HA KATTIE LOISE JEANETTA LOIS 1/9/2025andnbsp8:15 A M Medical Record Number: [id number] Patient Account Number: [id number] Date of Birth/Gender: Treating RN: Sep 06, 1970 (53 y.o. NETTY Cammie Sailors Primary Care Physician: Sadie Manna Other Clinician: Referring Physician: Treating Physician/Extender: Rosan Raisin Sadie Manna Weeks in Treatment: 25 Education Assessment Education Provided To: Patient Education Topics Provided Wound/Skin Impairment: Methods: Explain/Verbal Responses: State content correctly [employer]) Signed: 07/25/2023 5:11:58 PM By: Cammie Sailors RN, BSN Entered By: Cammie Sailors on 07/25/2023 08:47:43 -------------------------------------------------------------------------------- Wound Assessment Details Patient Name: Date of Service: [name] 07/25/2023 8:15 A M Medical Record Number: [id number] Patient Account Number: [id number] Date of Birth/Sex: Treating RN: Dec 11, 1970 (53 y.o. 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 Weeks Of Treatment: 25 Comorbid Anemia, Deep Vein Thrombosis, Hypertension, Peripheral Venous Clustered Wound: No History: Disease, Type II Diabetes, Gout Photos Wound Measurements Length: (cm) 5 Width: (cm) 1.8 Depth: (cm) 0.1 Area: (cm) 7.069 Volume: (cm) 0.707 % Reduction in Area: 45.9% % Reduction in Volume: 86.5% Epithelialization: Small (1-33%) Tunneling: No Undermining: No Wound Description Classification: Grade 2 Wound Margin: Distinct, outline attached Exudate Amount: Medium Exudate Type: Serosanguineous Exudate Color: red, brown Foul Odor After Cleansing: No Slough/Fibrino Yes Wound Bed Granulation Amount: Large (67-100%) Exposed Structure Granulation Quality: Red, Friable Fascia Exposed: No Necrotic Amount: Small  (1-33%) Fat Layer (Subcutaneous Tissue) Exposed: Yes Necrotic Quality: Adherent Slough Tendon Exposed: No Muscle Exposed: No Joint Exposed: No Bone Exposed: No Periwound Skin Texture Texture Color No Abnormalities Noted: No No Abnormalities Noted: No Callus: No Atrophie  Blanche: No Crepitus: No Cyanosis: No Excoriation: No Ecchymosis: No Induration: No Erythema: No Rash: No Hemosiderin Staining: Yes Scarring: Yes Mottled: No Pallor: No Moisture Rubor: No No Abnormalities Noted: Yes Temperature / Pain Temperature: No Abnormality Treatment Notes Wound #1 (Lower Leg) Wound Laterality: Right, Lateral Cleanser Soap and Water Discharge Instruction: May shower and wash wound with dial antibacterial soap and water prior to dressing change. Vashe 5.8 (oz) Discharge Instruction: Cleanse the wound with Vashe prior to applying a clean dressing using gauze sponges, not tissue or cotton balls. Peri-Wound Care Faraone, Tyheem K ([id number]) [phone].pdf Page 8 of 8 Triamcinolone 15 (g) Discharge Instruction: Use triamcinolone 15 (g) as directed Sween Lotion (Moisturizing lotion) Discharge Instruction: Apply moisturizing lotion as directed Topical Gentamicin Discharge Instruction: mix with mupirocin  and apply directly to wound bed. Mupirocin  Ointment Discharge Instruction: Apply Mupirocin  mix gentamicin to wound bed. Primary Dressing Maxorb Extra Ag+ Alginate Dressing, 2x2 (in/in) Discharge Instruction: Apply to wound bed as instructed Secondary Dressing ABD Pad, 8x10 Discharge Instruction: mPad as needed the Right lateral ankle to alleviate pressure on right medial ankle.Apply over primary dressing as directed. CarboFLEX Odor Control Dressing, 4x4 in Discharge Instruction: Apply over primary dressing as directed. Woven Gauze Sponge, Non-Sterile 4x4 in Discharge Instruction: Apply over primary dressing as directed. Zetuvit Plus 4x8 in Discharge  Instruction: Apply over primary dressing as directed. Secured With Compression Wrap Urgo K2 Lite, (equivalent to a 3 layer) two layer compression system, regular Discharge Instruction: Apply Urgo K2 Lite as directed (alternative to 3 layer compression). Stockinette Compression Stockings Facilities Manager) Signed: 07/25/2023 5:11:58 PM By: Cammie Sailors RN, BSN Entered By: Cammie Sailors on 07/25/2023 08:37:02 -------------------------------------------------------------------------------- Vitals Details Patient Name: Date of Service: HA KATTIE LOISE ANCHORS K. 07/25/2023 8:15 A M Medical Record Number: [id number] Patient Account Number: [id number] Date of Birth/Sex: Treating RN: 03-21-1971 (53 y.o. NETTY Cammie Sailors Primary Care Kavish Lafitte: Sadie Manna Other Clinician: Referring Shaquana Buel: Treating Gizelle Whetsel/Extender: Rosan Harlene Sadie Manna Weeks in Treatment: 25 Vital Signs Time Taken: 08:35 Temperature (F): 97.8 Height (in): 70 Pulse (bpm): 59 Weight (lbs): 264 Respiratory Rate (breaths/min): 18 Body Mass Index (BMI): 37.9 Blood Pressure (mmHg): 155/93 Reference Range: 80 - 120 mg / dl Electronic Signature(s) Signed: 07/25/2023 5:11:58 PM By: Cammie Sailors RN, BSN Entered By: Cammie Sailors on 07/25/2023 08:36:02

## 2023-07-31 NOTE — Progress Notes (Signed)
 [name], [name] ([id number]) [id number] Page 1 of 9 Visit Report for 07/25/2023 Chief Complaint Document Details Patient Name: Date of Service: [name], [name]. 07/25/2023 8:15 A M Medical Record Number: [id number] Patient Account Number: [id number] Date of Birth/Sex: Treating RN: 02/03/1971 (53 y.o. M) Primary Care Provider: Sadie Manna Other Clinician: Referring Provider: Treating Provider/Extender: Rosan Harlene Sadie Manna Weeks in Treatment: 25 Information Obtained from: Patient Chief Complaint 01/25/2023; bilateral lower extremity wounds Electronic Signature(s) Signed: 07/30/2023 12:04:19 PM By: Rosan Harlene DO Entered By: Rosan Harlene on 07/25/2023 09:04:08 -------------------------------------------------------------------------------- Debridement Details Patient Name: Date of Service: [name] KATTIE LOISE JEANETTA Price. 07/25/2023 8:15 A M Medical Record Number: [id number] Patient Account Number: [id number] Date of Birth/Sex: Treating RN: 09/03/1970 (53 y.o. [name] Primary Care Provider: Sadie Manna Other Clinician: Referring Provider: Treating Provider/Extender: Rosan Harlene Sadie Manna Weeks in Treatment: 25 Debridement Performed for Assessment: Wound #1 Right,Lateral Lower Leg Performed By: Physician Rosan Harlene, DO The following information was scribed by: Cammie Price The information was scribed for: Rosan Harlene Debridement Type: Debridement Severity of Tissue Pre Debridement: Fat layer exposed Level of Consciousness (Pre-procedure): Awake and Alert Pre-procedure Verification/Time Out Yes - 08:47 Taken: Start Time: 08:47 Pain Control: Lidocaine  4% T opical Solution Percent of Wound Bed Debrided: 100% T Area Debrided (cm): otal 7.06 Tissue and other material debrided: Non-Viable, Slough, Slough Level: Non-Viable Tissue Debridement Description: Selective/Open Wound Instrument:  Curette Bleeding: Minimum Hemostasis Achieved: Pressure Response to Treatment: Procedure was tolerated well Level of Consciousness (Post- Awake and Alert procedure): Post Debridement Measurements of Total Wound Length: (cm) 5 Width: (cm) 1.8 Depth: (cm) 0.1 Volume: (cm) 0.707 Character of Wound/Ulcer Post Debridement: Improved Severity of Tissue Post Debridement: Fat layer exposed [name], [name] ([id number]) [id number] Page 2 of 9 Post Procedure Diagnosis Same as Pre-procedure Electronic Signature(s) Signed: 07/25/2023 5:11:58 PM By: Cammie Sailors RN, BSN Signed: 07/30/2023 12:04:19 PM By: Rosan Harlene DO Entered By: Cammie Price on 07/25/2023 08:49:02 -------------------------------------------------------------------------------- HPI Details Patient Name: Date of Service: [name] KATTIE LOISE JEANETTA Price. 07/25/2023 8:15 A M Medical Record Number: [id number] Patient Account Number: [id number] Date of Birth/Sex: Treating RN: [birth year] (53 y.o. M) Primary Care Provider: Sadie Manna Other Clinician: Referring Provider: Treating Provider/Extender: Rosan Harlene Sadie Manna Weeks in Treatment: 25 History of Present Illness HPI Description: 01/25/2023 [name] is a 53 year old male with a past medical history of controlled type 2 diabetes on insulin , venous insufficiency and DVT that presents to the clinic for bilateral lower extremity wounds. He states that the wound on the left was started by a bug bite and has been present for the past 4 months. The right lower extremity wound he states started out as a soft tissue infection and has been present for the past 6 years. He has been treated by a wound care center in [place] [facility] in [place]. He was last seen in the wound care center 2 years ago Could not continue following up due to lack of transportation. He states that recently the rehab center has been wrapping his  legs with compression wrap and a wound dressing underneath. It is unclear when the last time this happened was. He is not wearing any compression today. He currently denies signs of infection. 7/19; patient presents for follow-up. We have been using antibiotic ointment with Hydrofera Blue under 3 layer compression to the lower extremities bilaterally. Wounds are smaller. 7/30; patient presents for follow-up. We have been using antibiotic ointment and Hydrofera Blue under  3 layer compression to the lower extremities bilaterally. Wounds are smaller. 8/6; patient presents for follow-up. We have been using antibiotic ointment with Hydrofera Blue under 3 layer compression to the lower extremities bilaterally. Wounds are stable. He denies signs of infection. 10/1; the patient has not been here for 3 weeks. He completed his course of Apligraf to the wound on the right lower extremity. Both legs were wrapped for 3 weeks. Fortunately the area on the left lower leg is healed however the right leg had debris on the surface and maceration but fortunately no evidence of infection. He reminds me that he had an extensive surgery on this area apparently several years ago by Dr. Harden for a staph infection. He has compression stockings from elastic therapy in [place] he got last May he says they are relatively new he is hardly worn them. 8/15; patient presents for follow-up. We have been using collagen to the left lower extremity and PolyMem silver  to the right lower extremity all under 3 layer compression. Wounds are slightly smaller today. Patient has been approved for Apligraf and he was agreeable to having this placed in office. 8/20; patient presents for follow-up. At last clinic visit we used Apligraf to the right lower extremity under 3 layer compression and collagen to the left lower extremity under 3 layer compression. Wounds are smaller. 8/27; Patient presents for follow-up. We have been using Apligraf to  the right lower extremity wound and collagen to the left lower extremity wound under 3 layer compression. Overall wounds appear well-healing. He has no issues or complaints. 9/30; patient presents for follow-up. We have been using Apligraf to the right lower extremity wound and switch to PolyMem to the left lower extremity wound. All under 3 layer compression. Wounds are well-healing. 9/10; patient presents for follow-up. We have been using Apligraf to the right lower extremity wound and PolyMem silver  to the left lower extremity wound all under 3 layer compression. Wounds are smaller. 10/8; last week we changed the primary dressing on the large right lower leg wound in the middle of scar tissue to Hydrofera Blue. He comes in today with a surface not looking particularly healthy. ALSO he has had a reopening in the area of the original left calf wound apparently happened while he was washing off the wound area. He is put polymen silver  on this and his own compression stocking since 10/15; the area on the left posterior calf is healed. The large wound on the right in the middle lips scar tissue we changed to Iodoflex I believe last week. Surface looks a lot better 10/29; the patient was hospitalized last week for a cellulitis in the submandibular region on the left. He was in hospital from 10/19 through 10/22. He feels a lot better. He says they took his wrap off and put Mepilex on the wound. He got out a week ago and that is what he has been doing at home. The wound is on the right lateral calf the area on the left posterior calf is healed as it was last time. The wound is in the middle of his surgical incision for a staph infection 11/5; wound on the right lateral leg the area on the left posterior calf remains healed. This is likely chronic venous in etiology. He also had a lot of scar in this area from surgery had by Dr. Harden several years ago for staph infection. He had a run of Apligraf in  September. We got approved for TheraSkin although  he has a lot of drainage here I think we can make any skin substitute not a viable option at the moment [name], [name] ([id number]) [id number] Page 3 of 9 11/19; wound on the right lateral leg. This is likely chronic venous in etiology although he has had previous surgery in this area by Dr. Harden several years ago for staph infection. We had approval for TheraSkin although he for some reason he is having too much drainage to consider that. We have been using silver  alginate. The wound is measuring smaller today with some rims of epithelialization but still moderate drainage 12/5; right lateral leg. This is a area that has had previous surgery for an infection. It is likely chronic venous. We have had approval for TheraSkin although there is still quite a bit of drainage here. We are using gent Mupin silver  alginate. Measurements are slightly smaller 12/10; right lateral lower leg. He continues to make continued progress albeit slowly. The surface of his wound looks healthy. The drainage that we were concerned about in the last 2 weeks has abated. It would be possible now to use the TheraSkin but we have to run prior approval again. Apparently his insurance fiscal year started on 06/16/2023. Currently we are using gent, mupirocin  and silver  alginate. As noted continued improvement 12/17; right lateral lower leg in the setting of previous extensive surgery in this area. Also may have some degree of chronic venous disease. We had approval for TheraSkin however he was having too much drainage at that time and therefore we did not use this. Currently we have been using gent mupirocin  and silver  alginate. The wound is made some improvements although painstakingly slowly 07/18/2023; patient's wound looks a lot better. We have been using silver  alginate Zituvimet under and Urgo K2 lite. We had TheraSkin approved about 4 5  weeks ago however for a period of time he developed excessive drainage. We have been using silver  alginate with underlying mupirocin . T oday healthy looking granulation rims of epithelialization wound is measuring smaller 07/25/2023; patient presents for follow-up. We have been using silver  alginate with antibiotic ointment under compression therapy. He has no issues or complaints today. He denies signs of infection. He has been approved for TheraSkin and he would like to proceed with this at next clinic visit. Electronic Signature(s) Signed: 07/30/2023 12:04:19 PM By: Rosan Raisin DO Entered By: Rosan Raisin on 07/25/2023 09:18:24 -------------------------------------------------------------------------------- Physical Exam Details Patient Name: Date of Service: [name] KATTIE LOISE JEANETTA Price. 07/25/2023 8:15 A M Medical Record Number: [id number] Patient Account Number: [id number] Date of Birth/Sex: Treating RN: 1971/06/01 (53 y.o. M) Primary Care Provider: Sadie Manna Other Clinician: Referring Provider: Treating Provider/Extender: Rosan Raisin Sadie, Vishwanath Weeks in Treatment: 25 Constitutional respirations regular, non-labored and within target range for patient.. Cardiovascular 2+ dorsalis pedis/posterior tibialis pulses. Psychiatric pleasant and cooperative. Notes T the right lateral lower leg there is an open wound with granulation tissue and slough. No evidence of surrounding infection including increased warmth, o erythema or purulent drainage. Edema control. Electronic Signature(s) Signed: 07/30/2023 12:04:19 PM By: Rosan Raisin DO Entered By: Rosan Raisin on 07/25/2023 09:19:19 -------------------------------------------------------------------------------- Physician Orders Details Patient Name: Date of Service: [name] KATTIE LOISE JEANETTA Price. 07/25/2023 8:15 A M Medical Record Number: [id number] Patient Account Number: [id number] Date of Birth/Sex: Treating  RN: 25-Apr-1971 (53 y.o. [name] Primary Care Provider: Sadie Manna Other Clinician: Referring Provider: Treating Provider/Extender: Rosan Raisin Sadie Manna Weeks in Treatment: 10 Verbal / Phone Orders: No [name], [name] (  [id number]) [id number] Page 4 of 9 Diagnosis Coding Follow-up Appointments ppointment in 1 week. - Dr. Rosan 08/01/2023 0930 (already scheduled) Return A ppointment in 2 weeks. - Dr. Rosan 08/08/23 @ 0845 (already scheduled) Return A Return appointment in 3 weeks. - Dr. Rosan (front office to schedule) Anesthetic (In clinic) Topical Lidocaine  4% applied to wound bed Cellular or Tissue Based Products Wound #1 Right,Lateral Lower Leg Cellular or Tissue Based Product Type: - Apligraf #1 applied 02/28/23 Apligraf #2 applied 03/05/2023 Apligraf #3 applied 03/12/2023 Apligraf #4 applied 03/19/2023 Apligraf #5 applied on 03/26/2023 Other Cellular or Tissue Based Products Orders/Instructions: - RUN IVR FOR THERASKIN-05/14/23- approved 100%- have to rerun on 06/16/2023 due to insurance renews. 06/25/23 NOT doing Theraskin 07/02/23 re-run Theraskin Re run IVR for Theraskin 07/18/2023 THeraskin approved - will start 08/01/23 Bathing/ Shower/ Hygiene May shower with protection but do not get wound dressing(s) wet. Protect dressing(s) with water repellant cover (for example, large plastic bag) or a cast cover and may then take shower. - Please do not get the legs wet. Please use cast protectors when showering. Cast protectors can be purchased from [employer], Medical supply store. Cost ranges from $17-$37 Off-Loading Other: - Keep legs elevated to heart level or above when sitting. You may place a pillow behind the calf for comfort, if so desired. Wound Treatment Wound #1 - Lower Leg Wound Laterality: Right, Lateral Cleanser: Soap and Water 1 x Per Week/30 Days Discharge Instructions: May shower and  wash wound with dial antibacterial soap and water prior to dressing change. Cleanser: Vashe 5.8 (oz) 1 x Per Week/30 Days Discharge Instructions: Cleanse the wound with Vashe prior to applying a clean dressing using gauze sponges, not tissue or cotton balls. Peri-Wound Care: Triamcinolone 15 (g) 1 x Per Week/30 Days Discharge Instructions: Use triamcinolone 15 (g) as directed Peri-Wound Care: Sween Lotion (Moisturizing lotion) 1 x Per Week/30 Days Discharge Instructions: Apply moisturizing lotion as directed Topical: Gentamicin 1 x Per Week/30 Days Discharge Instructions: mix with mupirocin  and apply directly to wound bed. Topical: Mupirocin  Ointment 1 x Per Week/30 Days Discharge Instructions: Apply Mupirocin  mix gentamicin to wound bed. Prim Dressing: Maxorb Extra Ag+ Alginate Dressing, 2x2 (in/in) 1 x Per Week/30 Days ary Discharge Instructions: Apply to wound bed as instructed Secondary Dressing: ABD Pad, 8x10 1 x Per Week/30 Days Discharge Instructions: mPad as needed the Right lateral ankle to alleviate pressure on right medial ankle.Apply over primary dressing as directed. Secondary Dressing: CarboFLEX Odor Control Dressing, 4x4 in 1 x Per Week/30 Days Discharge Instructions: Apply over primary dressing as directed. Secondary Dressing: Woven Gauze Sponge, Non-Sterile 4x4 in 1 x Per Week/30 Days Discharge Instructions: Apply over primary dressing as directed. Secondary Dressing: Zetuvit Plus 4x8 in 1 x Per Week/30 Days Discharge Instructions: Apply over primary dressing as directed. Compression Wrap: Urgo K2 Lite, (equivalent to a 3 layer) two layer compression system, regular 1 x Per Week/30 Days Discharge Instructions: Apply Urgo K2 Lite as directed (alternative to 3 layer compression). Compression Wrap: Stockinette 1 x Per Week/30 Days Patient Medications llergies: No Known Drug Allergies A Notifications Medication Indication [address] Blizard, Mubashir Price ([id number])  [id number] Page 5 of 9 07/25/2023 lidocaine  DOSE topical 4 % cream - cream topical once daily Electronic Signature(s) Signed: 07/30/2023 12:04:19 PM By: Rosan Raisin DO Entered By: Rosan Raisin on 07/25/2023 09:19:40 -------------------------------------------------------------------------------- Problem List Details Patient Name: Date of Service: [name] KATTIE LOISE ANCHORS Price. 07/25/2023 8:15 A M Medical Record Number: [id number] Patient Account Number: [id number]  Date of Birth/Sex: Treating RN: 1971-02-25 (53 y.o. 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 01/25/2023 No Yes exposed E11.622 Type 2 diabetes mellitus with other skin ulcer 01/25/2023 No Yes I87.313 Chronic venous hypertension (idiopathic) with ulcer of bilateral lower extremity 01/25/2023 No Yes Inactive Problems ICD-10 Code Description Active Date Inactive Date L97.822 Non-pressure chronic ulcer of other part of left lower leg with fat layer exposed 01/25/2023 01/25/2023 Resolved Problems Electronic Signature(s) Signed: 07/30/2023 12:04:19 PM By: Rosan Harlene DO Entered By: Rosan Harlene on 07/25/2023 08:52:13 -------------------------------------------------------------------------------- Progress Note Details Patient Name: Date of Service: [name] KATTIE LOISE ANCHORS Price. 07/25/2023 8:15 A M Medical Record Number: [id number] Patient Account Number: [id number] Date of Birth/Sex: Treating RN: Nov 02, 1970 (53 y.o. M) Primary Care Provider: Sadie Manna Other Clinician: Referring Provider: Treating Provider/Extender: Elijan, Googe Price ([id number])  [id number] Page 6 of 9 Weeks in Treatment: 25 Subjective Chief Complaint Information obtained from Patient 01/25/2023; bilateral lower extremity wounds History of Present Illness (HPI) 01/25/2023 [name] Ehle is a 53 year old male with a past medical history of controlled type 2 diabetes on insulin , venous insufficiency and DVT that presents to the clinic for bilateral lower extremity wounds. He states that the wound on the left was started by a bug bite and has been present for the past 4 months. The right lower extremity wound he states started out as a soft tissue infection and has been present for the past 6 years. He has been treated by a wound care center in [place] [facility] in [place]. He was last seen in the wound care center 2 years ago Could not continue following up due to lack of transportation. He states that recently the rehab center has been wrapping his legs with compression wrap and a wound dressing underneath. It is unclear when the last time this happened was. He is not wearing any compression today. He currently denies signs of infection. 7/19; patient presents for follow-up. We have been using antibiotic ointment with Hydrofera Blue under 3 layer compression to the lower extremities bilaterally. Wounds are smaller. 7/30; patient presents for follow-up. We have been using antibiotic ointment and Hydrofera Blue under 3 layer compression to the lower extremities bilaterally. Wounds are smaller. 8/6; patient presents for follow-up. We have been using antibiotic ointment with Hydrofera Blue under 3 layer compression to the lower extremities bilaterally. Wounds are stable. He denies signs of infection. 10/1; the patient has not been here for 3 weeks. He completed his course of Apligraf to the wound on the right lower extremity. Both legs were wrapped for 3 weeks. Fortunately the area on the left lower leg is healed however the  right leg had debris on the surface and maceration but fortunately no evidence of infection. He reminds me that he had an extensive surgery on this area apparently several years ago by Dr. Harden for a staph infection. He has compression stockings from elastic therapy in [place] he got last May he says they are relatively new he is hardly worn them. 8/15; patient presents for follow-up. We have been using collagen to the left lower extremity and PolyMem silver  to the right lower extremity all under 3 layer compression. Wounds are slightly smaller today. Patient has been approved for Apligraf and he was  agreeable to having this placed in office. 8/20; patient presents for follow-up. At last clinic visit we used Apligraf to the right lower extremity under 3 layer compression and collagen to the left lower extremity under 3 layer compression. Wounds are smaller. 8/27; Patient presents for follow-up. We have been using Apligraf to the right lower extremity wound and collagen to the left lower extremity wound under 3 layer compression. Overall wounds appear well-healing. He has no issues or complaints. 9/30; patient presents for follow-up. We have been using Apligraf to the right lower extremity wound and switch to PolyMem to the left lower extremity wound. All under 3 layer compression. Wounds are well-healing. 9/10; patient presents for follow-up. We have been using Apligraf to the right lower extremity wound and PolyMem silver  to the left lower extremity wound all under 3 layer compression. Wounds are smaller. 10/8; last week we changed the primary dressing on the large right lower leg wound in the middle of scar tissue to Hydrofera Blue. He comes in today with a surface not looking particularly healthy. ALSO he has had a reopening in the area of the original left calf wound apparently happened while he was washing off the wound area. He is put polymen silver  on this and his own compression stocking  since 10/15; the area on the left posterior calf is healed. The large wound on the right in the middle lips scar tissue we changed to Iodoflex I believe last week. Surface looks a lot better 10/29; the patient was hospitalized last week for a cellulitis in the submandibular region on the left. He was in hospital from 10/19 through 10/22. He feels a lot better. He says they took his wrap off and put Mepilex on the wound. He got out a week ago and that is what he has been doing at home. The wound is on the right lateral calf the area on the left posterior calf is healed as it was last time. The wound is in the middle of his surgical incision for a staph infection 11/5; wound on the right lateral leg the area on the left posterior calf remains healed. This is likely chronic venous in etiology. He also had a lot of scar in this area from surgery had by Dr. Harden several years ago for staph infection. He had a run of Apligraf in September. We got approved for TheraSkin although he has a lot of drainage here I think we can make any skin substitute not a viable option at the moment 11/19; wound on the right lateral leg. This is likely chronic venous in etiology although he has had previous surgery in this area by Dr. Harden several years ago for staph infection. We had approval for TheraSkin although he for some reason he is having too much drainage to consider that. We have been using silver  alginate. The wound is measuring smaller today with some rims of epithelialization but still moderate drainage 12/5; right lateral leg. This is a area that has had previous surgery for an infection. It is likely chronic venous. We have had approval for TheraSkin although there is still quite a bit of drainage here. We are using gent Mupin silver  alginate. Measurements are slightly smaller 12/10; right lateral lower leg. He continues to make continued progress albeit slowly. The surface of his wound looks healthy. The  drainage that we were concerned about in the last 2 weeks has abated. It would be possible now to use the TheraSkin but we have to  run prior approval again. Apparently his insurance fiscal year started on 06/16/2023. Currently we are using gent, mupirocin  and silver  alginate. As noted continued improvement 12/17; right lateral lower leg in the setting of previous extensive surgery in this area. Also may have some degree of chronic venous disease. We had approval for TheraSkin however he was having too much drainage at that time and therefore we did not use this. Currently we have been using gent mupirocin  and silver  alginate. The wound is made some improvements although painstakingly slowly 07/18/2023; patient's wound looks a lot better. We have been using silver  alginate Zituvimet under and Urgo K2 lite. We had TheraSkin approved about 4 5 weeks ago however for a period of time he developed excessive drainage. We have been using silver  alginate with underlying mupirocin . T oday healthy looking granulation rims of epithelialization wound is measuring smaller 07/25/2023; patient presents for follow-up. We have been using silver  alginate with antibiotic ointment under compression therapy. He has no issues or complaints today. He denies signs of infection. He has been approved for TheraSkin and he would like to proceed with this at next clinic visit. [name], [name] ([id number]) [id number] Page 7 of 9 Objective Constitutional respirations regular, non-labored and within target range for patient.. Vitals Time Taken: 8:35 AM, Height: 70 in, Weight: 264 lbs, BMI: 37.9, Temperature: 97.8 F, Pulse: 59 bpm, Respiratory Rate: 18 breaths/min, Blood Pressure: 155/93 mmHg. Cardiovascular 2+ dorsalis pedis/posterior tibialis pulses. Psychiatric pleasant and cooperative. General Notes: T the right lateral lower leg there is an open wound with granulation tissue and slough. No  evidence of surrounding infection including increased o warmth, erythema or purulent drainage. Edema control. Integumentary (Hair, Skin) Wound #1 status is Open. Original cause of wound was Other Lesion. The date acquired was: 01/01/2018. The wound has been in treatment 25 weeks. The wound is located on the Right,Lateral Lower Leg. The wound measures 5cm length x 1.8cm width x 0.1cm depth; 7.069cm^2 area and 0.707cm^3 volume. There is Fat Layer (Subcutaneous Tissue) exposed. There is no tunneling or undermining noted. There is a medium amount of serosanguineous drainage noted. The wound margin is distinct with the outline attached to the wound base. There is large (67-100%) red, friable granulation within the wound bed. There is a small (1- 33%) amount of necrotic tissue within the wound bed including Adherent Slough. The periwound skin appearance had no abnormalities noted for moisture. The periwound skin appearance exhibited: Scarring, Hemosiderin Staining. The periwound skin appearance did not exhibit: Callus, Crepitus, Excoriation, Induration, Rash, Atrophie Blanche, Cyanosis, Ecchymosis, Mottled, Pallor, Rubor, Erythema. Periwound temperature was noted as No Abnormality. 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 It has healthy granulation tissue Postdebridement. He has been approved for TheraSkin and the wound bed appears ready for this application. We will order it for next Monday visit. Continue compression wrap. Follow-up in 1 week. Procedures Wound #1 Pre-procedure diagnosis of Wound #1 is a Diabetic Wound/Ulcer of the Lower Extremity located on the Right,Lateral Lower Leg .Severity of Tissue Pre Debridement is: Fat layer exposed. There was a Selective/Open  Wound Non-Viable Tissue Debridement with a total area of 7.06 sq cm performed by Rosan Raisin, DO. With the following instrument(s): Curette to remove Non-Viable tissue/material. Material removed includes [facility] after achieving pain control using Lidocaine  4% Topical Solution.  No specimens were taken. A time out was conducted at 08:47, prior to the start of the procedure. A Minimum amount of bleeding was controlled with Pressure. The procedure was tolerated well. Post Debridement Measurements: 5cm length x 1.8cm width x 0.1cm depth; 0.707cm^3 volume. Character of Wound/Ulcer Post Debridement is improved. Severity of Tissue Post Debridement is: Fat layer exposed. Post procedure Diagnosis Wound #1: Same as Pre-Procedure Pre-procedure diagnosis of Wound #1 is a Diabetic Wound/Ulcer of the Lower Extremity located on the Right,Lateral Lower Leg . There was a Three Layer Compression Therapy Procedure by Cammie Sailors, RN. Post procedure Diagnosis Wound #1: Same as Pre-Procedure Plan Follow-up Appointments: Return Appointment in 1 week. - Dr. Rosan 08/01/2023 0930 (already scheduled) Return Appointment in 2 weeks. - Dr. Rosan 08/08/23 @ 0845 (already scheduled) Return appointment in 3 weeks. - Dr. Rosan (front office to schedule) Anesthetic: (In clinic) Topical Lidocaine  4% applied to wound bed Cellular or Tissue Based Products: Wound #1 Right,Lateral Lower Leg: Cellular or Tissue Based Product Type: - Apligraf #1 applied 02/28/23 Apligraf #2 applied 03/05/2023 Apligraf #3 applied 03/12/2023 Apligraf #4 applied 03/19/2023 Apligraf #5 applied on 03/26/2023 KINGSLY, KLOEPFER Price ([id number]) [id number] Page 8 of 9 Other Cellular or Tissue Based Products Orders/Instructions: - RUN IVR FOR THERASKIN-05/14/23- approved 100%- have to rerun on 06/16/2023 due to insurance renews. 06/25/23 NOT doing Theraskin 07/02/23 re-run Theraskin Re run IVR for Theraskin 07/18/2023 THeraskin  approved - will start 08/01/23 Bathing/ Shower/ Hygiene: May shower with protection but do not get wound dressing(s) wet. Protect dressing(s) with water repellant cover (for example, large plastic bag) or a cast cover and may then take shower. - Please do not get the legs wet. Please use cast protectors when showering. Cast protectors can be purchased from [employer], Medical supply store. Cost ranges from $17-$37 Off-Loading: Other: - Keep legs elevated to heart level or above when sitting. You may place a pillow behind the calf for comfort, if so desired. The following medication(s) was prescribed: lidocaine  topical 4 % cream cream topical once daily was prescribed at facility WOUND #1: - Lower Leg Wound Laterality: Right, Lateral Cleanser: Soap and Water 1 x Per Week/30 Days Discharge Instructions: May shower and wash wound with dial antibacterial soap and water prior to dressing change. Cleanser: Vashe 5.8 (oz) 1 x Per Week/30 Days Discharge Instructions: Cleanse the wound with Vashe prior to applying a clean dressing using gauze sponges, not tissue or cotton balls. Peri-Wound Care: Triamcinolone 15 (g) 1 x Per Week/30 Days Discharge Instructions: Use triamcinolone 15 (g) as directed Peri-Wound Care: Sween Lotion (Moisturizing lotion) 1 x Per Week/30 Days Discharge Instructions: Apply moisturizing lotion as directed Topical: Gentamicin 1 x Per Week/30 Days Discharge Instructions: mix with mupirocin  and apply directly to wound bed. Topical: Mupirocin  Ointment 1 x Per Week/30 Days Discharge Instructions: Apply Mupirocin  mix gentamicin to wound bed. Prim Dressing: Maxorb Extra Ag+ Alginate Dressing, 2x2 (in/in) 1 x Per Week/30 Days ary Discharge Instructions: Apply to wound bed as instructed Secondary Dressing: ABD Pad, 8x10 1 x Per Week/30 Days Discharge Instructions: mPad as needed the Right lateral ankle to alleviate pressure on right medial ankle.Apply over primary  dressing as directed. Secondary Dressing: CarboFLEX Odor Control Dressing, 4x4 in 1 x Per Week/30 Days Discharge Instructions: Apply over primary dressing as directed. Secondary Dressing: Woven Gauze Sponge, Non-Sterile 4x4 in 1 x Per Week/30 Days Discharge Instructions: Apply over primary dressing as directed. Secondary Dressing: Zetuvit Plus 4x8 in 1 x Per Week/30 Days Discharge  Instructions: Apply over primary dressing as directed. Com pression Wrap: Urgo K2 Lite, (equivalent to a 3 layer) two layer compression system, regular 1 x Per Week/30 Days Discharge Instructions: Apply Urgo K2 Lite as directed (alternative to 3 layer compression). Com pression Wrap: Stockinette 1 x Per Week/30 Days 1. In office sharp debridement 2. Silver  alginate with antibiotic ointment 3. 3 layer compressionright lower extremity 4. Follow-up in 1 week Electronic Signature(s) Signed: 07/30/2023 12:04:19 PM By: Rosan Raisin DO Entered By: Rosan Raisin on 07/25/2023 09:20:47 -------------------------------------------------------------------------------- SuperBill Details Patient Name: Date of Service: [name] KATTIE LOISE ANCHORS Price. 07/25/2023 Medical Record Number: [id number] Patient Account Number: [id number] Date of Birth/Sex: Treating RN: 14-Sep-1970 (52 y.o. M) Primary Care Provider: Sadie Manna Other Clinician: Referring Provider: Treating Provider/Extender: Rosan Raisin Sadie Manna Weeks in Treatment: 25 Diagnosis Coding ICD-10 Codes Code Description [phone] Non-pressure chronic ulcer of other part of right lower leg with fat layer exposed E11.622 Type 2 diabetes mellitus with other skin ulcer I87.313 Chronic venous hypertension (idiopathic) with ulcer of bilateral lower extremity Facility Procedures : [facility] CPT4 Code: [id number] SHAWAN, CORELLA Price ([id number] Description: [phone] - DEBRIDE WOUND 1ST 20 SQ CM OR < ICD-10 Diagnosis Description L97.812 Non-pressure chronic ulcer of other part of  right lower leg with fat layer expos 9940) [phone] E11.622 Type 2 diabetes mellitus with other skin ulcer  I87.313 Chronic venous hypertension (idiopathic) with ulcer of bilateral lower extremity Modifier: [id number] Quantity: 1 df Page 9 of 9 Physician Procedures : CPT4 Code Description Modifier [id number] - WC PHYS DEBR WO ANESTH 20 SQ CM ICD-10 Diagnosis Description L97.812 Non-pressure chronic ulcer of other part of right lower leg with fat layer exposed E11.622 Type 2 diabetes mellitus with other skin  ulcer I87.313 Chronic venous hypertension (idiopathic) with ulcer of bilateral lower extremity Quantity: 1 Electronic Signature(s) Signed: 07/30/2023 12:04:19 PM By: Rosan Raisin DO Entered By: Rosan Raisin on 07/25/2023 09:20:57

## 2023-08-01 ENCOUNTER — Encounter (HOSPITAL_BASED_OUTPATIENT_CLINIC_OR_DEPARTMENT_OTHER): Payer: Medicaid Other | Admitting: Internal Medicine

## 2023-08-01 DIAGNOSIS — I87313 Chronic venous hypertension (idiopathic) with ulcer of bilateral lower extremity: Secondary | ICD-10-CM

## 2023-08-01 DIAGNOSIS — L97812 Non-pressure chronic ulcer of other part of right lower leg with fat layer exposed: Secondary | ICD-10-CM | POA: Diagnosis not present

## 2023-08-01 DIAGNOSIS — E11622 Type 2 diabetes mellitus with other skin ulcer: Secondary | ICD-10-CM | POA: Diagnosis not present

## 2023-08-01 NOTE — Progress Notes (Addendum)
[name], [name] ([id number]) [id number] Page 1 of 9 Visit Report for 08/01/2023 Chief Complaint Document Details Patient Name: Date of Service: [name]. 08/01/2023 9:30 A M Medical Record Number: [id number] Patient Account Number: [id number] Date of Birth/Sex: Treating RN: 1971/06/10 (53 y.o. M) Primary Care Provider: Barbette Reichmann Other Clinician: Referring Provider: Treating Provider/Extender: Adolph Pollack Weeks in Treatment: 26 Information Obtained from: Patient Chief Complaint 01/25/2023; bilateral lower extremity wounds Electronic Signature(s) Signed: 08/01/2023 4:08:41 PM By: Geralyn Corwin DO Entered By: Geralyn Corwin on 08/01/2023 10:52:54 -------------------------------------------------------------------------------- Cellular or Tissue Based Product Details Patient Name: Date of Service: [name]. 08/01/2023 9:30 A M Medical Record Number: [id number] Patient Account Number: [id number] Date of Birth/Sex: Treating RN: [birth year] (53 y.o. [name] Primary Care Provider: Barbette Reichmann Other Clinician: Referring Provider: Treating Provider/Extender: Adolph Pollack Weeks in Treatment: 26 Cellular or Tissue Based Product Type Wound #1 Right,Lateral Lower Leg Applied to: Performed By: Physician Geralyn Corwin, DO The following information was scribed by: Redmond Pulling The information was scribed for: Geralyn Corwin Cellular or Tissue Based Product Type: Theraskin Level of Consciousness (Pre-procedure): Awake and Alert Pre-procedure Verification/Time Out Yes - 10:25 Taken: Location: trunk / arms / legs Wound Size (sq cm): 10 Product Size (sq cm): 6.5 Waste Size (sq cm): 0 Amount of Product Applied (sq cm): 6.5 Instrument Used: Forceps Lot #: [id number] Expiration Date: 10/27/2027 Fenestrated: No Reconstituted: Yes Solution Type: normal  saline Solution Amount: 10 mL Lot #: [id number] Solution Expiration Date: 11/03/2024 Secured: Yes Secured With: Steri-Strips Dressing Applied: Yes Primary Dressing: adaptic, gauze, Procedural Pain: 0 Post Procedural Pain: 0 Response to Treatment: Procedure was tolerated well [name], [name] ([id number]) [id number] Page 2 of 9 Level of Consciousness (Post- Awake and Alert procedure): Post Procedure Diagnosis Same as Pre-procedure Electronic Signature(s) Signed: 08/01/2023 4:08:41 PM By: Geralyn Corwin DO Signed: 08/01/2023 4:39:29 PM By: Redmond Pulling RN, BSN Entered By: Redmond Pulling on 08/01/2023 25:36:64 -------------------------------------------------------------------------------- Debridement Details Patient Name: Date of Service: [name]. 08/01/2023 9:30 A M Medical Record Number: [id number] Patient Account Number: [id number] Date of Birth/Sex: Treating RN: 1971-04-01 (53 y.o. [name] Primary Care Provider: Barbette Reichmann Other Clinician: Referring Provider: Treating Provider/Extender: Adolph Pollack Weeks in Treatment: 26 Debridement Performed for Assessment: Wound #1 Right,Lateral Lower Leg Performed By: Physician Geralyn Corwin, DO The following information was scribed by: Redmond Pulling The information was scribed for: Geralyn Corwin Debridement Type: Debridement Severity of Tissue Pre Debridement: Fat layer exposed Level of Consciousness (Pre-procedure): Awake and Alert Pre-procedure Verification/Time Out Yes - 10:25 Taken: Start Time: 10:25 Pain Control: Lidocaine 5% topical ointment Percent of Wound Bed Debrided: 100% T Area Debrided (cm): otal 7.85 Tissue and other material debrided: Non-Viable, Callus, Slough, Skin: Dermis , Skin: Epidermis, Slough Level: Skin/Epidermis Debridement Description: Selective/Open Wound Instrument: Curette Bleeding: Minimum Hemostasis Achieved:  Pressure Response to Treatment: Procedure was tolerated well Level of Consciousness (Post- Awake and Alert procedure): Post Debridement Measurements of Total Wound Length: (cm) 5 Width: (cm) 2 Depth: (cm) 0.1 Volume: (cm) 0.785 Character of Wound/Ulcer Post Debridement: Improved Severity of Tissue Post Debridement: Fat layer exposed Post Procedure Diagnosis Same as Pre-procedure Electronic Signature(s) Signed: 08/01/2023 4:08:41 PM By: Geralyn Corwin DO Signed: 08/01/2023 4:39:29 PM By: Redmond Pulling RN, BSN Entered By: Redmond Pulling on 08/01/2023 10:25:02 [name] ([id number]) [id number] Page 3 of 9 -------------------------------------------------------------------------------- HPI Details Patient Name: Date of Service: [name]. 08/01/2023 9:30 A M Medical Record Number: [id number] Patient Account Number: [id number] Date of Birth/Sex: Treating RN: Aug 20, 1970 (53 y.o. M) Primary Care Provider: Barbette Reichmann Other Clinician: Referring Provider: Treating Provider/Extender: Adolph Pollack Weeks in Treatment: 26 History of Present Illness HPI Description: 01/25/2023 [name] is a 53 year old male with a past medical history of controlled type 2 diabetes on insulin, venous insufficiency and DVT that presents to the clinic for bilateral lower extremity wounds. He states that the wound on the left was started by a bug bite and has been present for the past 4 months. The right lower extremity wound he states started out as a soft tissue infection and has been present for the past 6 years. He has been treated by a wound care center in [facility] in [place]. He was last seen in the wound care center 2 years ago Could not continue following up due to lack of transportation. He states that recently the rehab center has been wrapping his legs with compression wrap and a wound dressing  underneath. It is unclear when the last time this happened was. He is not wearing any compression today. He currently denies signs of infection. 7/19; patient presents for follow-up. We have been using antibiotic ointment with Hydrofera Blue under 3 layer compression to the lower extremities bilaterally. Wounds are smaller. 7/30; patient presents for follow-up. We have been using antibiotic ointment and Hydrofera Blue under 3 layer compression to the lower extremities bilaterally. Wounds are smaller. 8/6; patient presents for follow-up. We have been using antibiotic ointment with Hydrofera Blue under 3 layer compression to the lower extremities bilaterally. Wounds are stable. He denies signs of infection. 10/1; the patient has not been here for 3 weeks. He completed his course of Apligraf to the wound on the right lower extremity. Both legs were wrapped for 3 weeks. Fortunately the area on the left lower leg is healed however the right leg had debris on the surface and maceration but fortunately no evidence of infection. He reminds me that he had an extensive surgery on this area apparently several years ago by Dr. Lajoyce Corners for a "staph infection". He has compression stockings from elastic therapy in [place] he got last May he says they are relatively new he is hardly worn them. 8/15; patient presents for follow-up. We have been using collagen to the left lower extremity and PolyMem silver to the right lower extremity all under 3 layer compression. Wounds are slightly smaller today. Patient has been approved for Apligraf and he was agreeable to having this placed in office. 8/20; patient presents for follow-up. At last clinic visit we used Apligraf to the right lower extremity under 3 layer compression and collagen to the left lower extremity under 3 layer compression. Wounds are smaller. 8/27; Patient presents for follow-up. We have been using Apligraf to the right lower extremity wound and collagen to  the left lower extremity wound under 3 layer compression. Overall wounds appear well-healing. He has no issues or complaints. 9/30; patient presents for follow-up. We have been using Apligraf to the right lower extremity wound and switch to PolyMem to the left lower extremity wound. All under 3 layer compression. Wounds are well-healing. 9/10; patient presents for follow-up. We have been using Apligraf to the right lower extremity wound and PolyMem silver to the left lower extremity wound all under 3 layer compression. Wounds are smaller. 10/8; last week we changed the primary dressing on the large right lower  leg wound in the middle of scar tissue to [facility]. He comes in today with a surface not looking particularly healthy. ALSO he has had a reopening in the area of the original left calf wound apparently happened while he was washing off the wound area. He is put polymen silver on this and his own compression stocking since 10/15; the area on the left posterior calf is healed. The large wound on the right in the middle lips scar tissue we changed to Iodoflex I believe last week. Surface looks a lot better 10/29; the patient was hospitalized last week for a cellulitis in the submandibular region on the left. He was in hospital from 10/19 through 10/22. He feels a lot better. He says they took his wrap off and put Mepilex on the wound. He got out a week ago and that is what he has been doing at home. The wound is on the right lateral calf the area on the left posterior calf is healed as it was last time. The wound is in the middle of his surgical incision for a staph infection 11/5; wound on the right lateral leg the area on the left posterior calf remains healed. This is likely chronic venous in etiology. He also had a lot of scar in this area from surgery had by Dr. Lajoyce Corners several years ago for staph infection. He had a run of Apligraf in September. We got approved for TheraSkin although  he has a lot of drainage here I think we can make any skin substitute not a viable option at the moment 11/19; wound on the right lateral leg. This is likely chronic venous in etiology although he has had previous surgery in this area by Dr. Lajoyce Corners several years ago for staph infection. We had approval for TheraSkin although he for some reason he is having too much drainage to consider that. We have been using silver alginate. The wound is measuring smaller today with some rims of epithelialization but still moderate drainage 12/5; right lateral leg. This is a area that has had previous surgery for an infection. It is likely chronic venous. We have had approval for TheraSkin although there is still quite a bit of drainage here. We are using gent Mupin silver alginate. Measurements are slightly smaller 12/10; right lateral lower leg. He continues to make continued progress albeit slowly. The surface of his wound looks healthy. The drainage that we were concerned about in the last 2 weeks has abated. It would be possible now to use the TheraSkin but we have to run prior approval again. Apparently his insurance fiscal year started on 06/16/2023. Currently we are using gent, mupirocin and silver alginate. As noted continued improvement 12/17; right lateral lower leg in the setting of previous extensive surgery in this area. Also may have some degree of chronic venous disease. We had approval for TheraSkin however he was having too much drainage at that time and therefore we did not use this. Currently we have been using gent mupirocin and silver alginate. The wound is made some improvements although painstakingly slowly 07/18/2023; patient's wound looks a lot better. We have been using silver alginate Zituvimet under and Urgo K2 lite. We had TheraSkin approved about 4 5 weeks ago however for a period of time he developed excessive drainage. We have been using silver alginate with underlying mupirocin. T oday  healthy looking granulation rims of epithelialization wound is measuring smaller 07/25/2023; patient presents for follow-up. We have been using silver alginate  with antibiotic ointment under compression therapy. He has no issues or complaints today. He denies signs of infection. He has been approved for TheraSkin and he would like to proceed with this at next clinic visit. [name], [name] ([id number]) [id number] Page 4 of 9 08/01/2023; patient presents for follow-up. He has been approved for TheraSkin and he is agreeable to have this placed in office today. We have been using silver alginate with antibiotic ointment under compression wrap to the right leg over the past week. Patient denies signs of infection. Electronic Signature(s) Signed: 08/01/2023 4:08:41 PM By: Geralyn Corwin DO Entered By: Geralyn Corwin on 08/01/2023 10:53:43 -------------------------------------------------------------------------------- Physical Exam Details Patient Name: Date of Service: [name]. 08/01/2023 9:30 A M Medical Record Number: [id number] Patient Account Number: [id number] Date of Birth/Sex: Treating RN: [birth year] (53 y.o. M) Primary Care Provider: Barbette Reichmann Other Clinician: Referring Provider: Treating Provider/Extender: Marcha Dutton, Vishwanath Weeks in Treatment: 26 Constitutional respirations regular, non-labored and within target range for patient.. Cardiovascular 2+ dorsalis pedis/posterior tibialis pulses. Psychiatric pleasant and cooperative. Notes T the right lateral lower leg there is an open wound with granulation tissue and slough. No evidence of surrounding infection including increased warmth, o erythema or purulent drainage. Good Edema control. Electronic Signature(s) Signed: 08/01/2023 4:08:41 PM By: Geralyn Corwin DO Entered By: Geralyn Corwin on 08/01/2023  10:54:14 -------------------------------------------------------------------------------- Physician Orders Details Patient Name: Date of Service: [name]. 08/01/2023 9:30 A M Medical Record Number: [id number] Patient Account Number: [id number] Date of Birth/Sex: Treating RN: [birth year] (53 y.o. [name] Primary Care Provider: Barbette Reichmann Other Clinician: Referring Provider: Treating Provider/Extender: Adolph Pollack Weeks in Treatment: 55 Verbal / Phone Orders: No Diagnosis Coding Follow-up Appointments ppointment in 1 week. - Dr. Mikey Bussing 08/08/23 @ 0845 (already scheduled) Return A ppointment in 2 weeks. - Dr. Mikey Bussing (front office to schedule) Return A Return appointment in 3 weeks. - Dr. Mikey Bussing (front office to schedule) Anesthetic (In clinic) Topical Lidocaine 5% applied to wound bed Cellular or Tissue Based Products Wound #1 Right,Lateral Lower Leg Cellular or Tissue Based Product Type: - Theraskin #1 applied 08/01/2023 Apligraf #1 applied 02/28/23 Apligraf #2 applied 03/05/2023 ELESTER, [name] ([id number]) [id number] Page 5 of 9 Apligraf #3 applied 03/12/2023 Apligraf #4 applied 03/19/2023 Apligraf #5 applied on 03/26/2023 Other Cellular or Tissue Based Products Orders/Instructions: - RUN IVR FOR THERASKIN-05/14/23- approved 100%- have to rerun on 06/16/2023 due to insurance renews. 06/25/23 NOT doing Theraskin 07/02/23 re-run Theraskin Re run IVR for Theraskin 07/18/2023 THeraskin approved - will start 08/01/23 Bathing/ Shower/ Hygiene May shower with protection but do not get wound dressing(s) wet. Protect dressing(s) with water repellant cover (for example, large plastic bag) or a cast cover and may then take shower. - Please do not get the legs wet. Please use cast protectors when showering. Cast protectors can be purchased from [employer], Medical supply store. Cost ranges from  $17-$37 Off-Loading Other: - Keep legs elevated to heart level or above when sitting. You may place a pillow behind the calf for comfort, if so desired. Wound Treatment Wound #1 - Lower Leg Wound Laterality: Right, Lateral Cleanser: Soap and Water 1 x Per Week/30 Days Discharge Instructions: May shower and wash wound with dial antibacterial soap and water prior to dressing change. Cleanser: Vashe 5.8 (oz) 1 x Per Week/30 Days Discharge Instructions: Cleanse the wound with Vashe prior to applying a clean dressing using gauze sponges, not tissue or cotton balls. Peri-Wound Care: Triamcinolone  15 (g) 1 x Per Week/30 Days Discharge Instructions: Use triamcinolone 15 (g) as directed Peri-Wound Care: Sween Lotion (Moisturizing lotion) 1 x Per Week/30 Days Discharge Instructions: Apply moisturizing lotion as directed Prim Dressing: Theraskin ary 1 x Per Week/30 Days Secondary Dressing: ADAPTIC TOUCH 3x4.25 in 1 x Per Week/30 Days Discharge Instructions: Apply over primary dressing as directed. Secondary Dressing: CarboFLEX Odor Control Dressing, 4x4 in 1 x Per Week/30 Days Discharge Instructions: Apply over primary dressing as directed. Secondary Dressing: Woven Gauze Sponge, Non-Sterile 4x4 in 1 x Per Week/30 Days Discharge Instructions: Apply over primary dressing as directed. Secondary Dressing: Zetuvit Plus 4x8 in 1 x Per Week/30 Days Discharge Instructions: Apply over primary dressing as directed. Secured With: Steri-Strip 1/4x3 (in/in) 1 x Per Week/30 Days Discharge Instructions: Secure dressing with steri-strips as directed. Compression Wrap: Urgo K2 Lite, (equivalent to a 3 layer) two layer compression system, regular 1 x Per Week/30 Days Discharge Instructions: Apply Urgo K2 Lite as directed (alternative to 3 layer compression). Compression Wrap: Stockinette 1 x Per Week/30 Days Electronic Signature(s) Signed: 08/01/2023 4:08:41 PM By: Geralyn Corwin DO Entered By: Geralyn Corwin  on 08/01/2023 11:05:33 -------------------------------------------------------------------------------- Problem List Details Patient Name: Date of Service: [name]. 08/01/2023 9:30 A M Medical Record Number: [id number] Patient Account Number: [id number] Date of Birth/Sex: Treating RN: 1970-07-25 (53 y.o. M) Primary Care Provider: Barbette Reichmann Other Clinician: Referring Provider: Treating Provider/Extender: Adolph Pollack Weeks in Treatment: [address], Damier Price ([id number]) [id number] Page 6 of 9 Active Problems ICD-10 Encounter Code Description Active Date MDM Diagnosis L97.812 Non-pressure chronic ulcer of other part of right lower leg with fat layer 01/25/2023 No Yes exposed E11.622 Type 2 diabetes mellitus with other skin ulcer 01/25/2023 No Yes I87.313 Chronic venous hypertension (idiopathic) with ulcer of bilateral lower extremity 01/25/2023 No Yes Inactive Problems ICD-10 Code Description Active Date Inactive Date L97.822 Non-pressure chronic ulcer of other part of left lower leg with fat layer exposed 01/25/2023 01/25/2023 Resolved Problems Electronic Signature(s) Signed: 08/01/2023 4:08:41 PM By: Geralyn Corwin DO Entered By: Geralyn Corwin on 08/01/2023 10:52:40 -------------------------------------------------------------------------------- Progress Note Details Patient Name: Date of Service: [name]. 08/01/2023 9:30 A M Medical Record Number: [id number] Patient Account Number: [id number] Date of Birth/Sex: Treating RN: 1970-12-08 (53 y.o. M) Primary Care Provider: Barbette Reichmann Other Clinician: Referring Provider: Treating Provider/Extender: Adolph Pollack Weeks in Treatment: 103 Subjective Chief Complaint Information obtained from Patient 01/25/2023; bilateral lower extremity wounds History of Present Illness (HPI) 01/25/2023 [name] is a  53 year old male with a past medical history of controlled type 2 diabetes on insulin, venous insufficiency and DVT that presents to the clinic for bilateral lower extremity wounds. He states that the wound on the left was started by a bug bite and has been present for the past 4 months. The right lower extremity wound he states started out as a soft tissue infection and has been present for the past 6 years. He has been treated by a wound care center in [place] [facility] in [place]. He was last seen in the wound care center 2 years ago Could not continue following up due to lack of transportation. He states that recently the rehab center has been wrapping his legs with compression wrap and a wound dressing underneath. It is unclear when the last time this happened was. He is not wearing any compression today. He currently denies signs of infection. 7/19; patient presents for follow-up. We  have been using antibiotic ointment with Hydrofera Blue under 3 layer compression to the lower extremities bilaterally. Wounds are smaller. 7/30; patient presents for follow-up. We have been using antibiotic ointment and Hydrofera Blue under 3 layer compression to the lower extremities bilaterally. Wounds are smaller. 8/6; patient presents for follow-up. We have been using antibiotic ointment with Hydrofera Blue under 3 layer compression to the lower extremities bilaterally. Wounds are stable. He denies signs of infection. 10/1; the patient has not been here for 3 weeks. He completed his course of Apligraf to the wound on the right lower extremity. Both legs were wrapped for 3 [name], [name] ([id number]) [id number] Page 7 of 9 weeks. Fortunately the area on the left lower leg is healed however the right leg had debris on the surface and maceration but fortunately no evidence of infection. He reminds me that he had an extensive surgery on this area apparently several  years ago by Dr. Lajoyce Corners for a "staph infection". He has compression stockings from elastic therapy in [place] he got last May he says they are relatively new he is hardly worn them. 8/15; patient presents for follow-up. We have been using collagen to the left lower extremity and PolyMem silver to the right lower extremity all under 3 layer compression. Wounds are slightly smaller today. Patient has been approved for Apligraf and he was agreeable to having this placed in office. 8/20; patient presents for follow-up. At last clinic visit we used Apligraf to the right lower extremity under 3 layer compression and collagen to the left lower extremity under 3 layer compression. Wounds are smaller. 8/27; Patient presents for follow-up. We have been using Apligraf to the right lower extremity wound and collagen to the left lower extremity wound under 3 layer compression. Overall wounds appear well-healing. He has no issues or complaints. 9/30; patient presents for follow-up. We have been using Apligraf to the right lower extremity wound and switch to PolyMem to the left lower extremity wound. All under 3 layer compression. Wounds are well-healing. 9/10; patient presents for follow-up. We have been using Apligraf to the right lower extremity wound and PolyMem silver to the left lower extremity wound all under 3 layer compression. Wounds are smaller. 10/8; last week we changed the primary dressing on the large right lower leg wound in the middle of scar tissue to Hydrofera Blue. He comes in today with a surface not looking particularly healthy. ALSO he has had a reopening in the area of the original left calf wound apparently happened while he was washing off the wound area. He is put polymen silver on this and his own compression stocking since 10/15; the area on the left posterior calf is healed. The large wound on the right in the middle lips scar tissue we changed to Iodoflex I believe last week. Surface  looks a lot better 10/29; the patient was hospitalized last week for a cellulitis in the submandibular region on the left. He was in hospital from 10/19 through 10/22. He feels a lot better. He says they took his wrap off and put Mepilex on the wound. He got out a week ago and that is what he has been doing at home. The wound is on the right lateral calf the area on the left posterior calf is healed as it was last time. The wound is in the middle of his surgical incision for a staph infection 11/5; wound on the right lateral leg the area on the left posterior calf remains  healed. This is likely chronic venous in etiology. He also had a lot of scar in this area from surgery had by Dr. Lajoyce Corners several years ago for staph infection. He had a run of Apligraf in September. We got approved for TheraSkin although he has a lot of drainage here I think we can make any skin substitute not a viable option at the moment 11/19; wound on the right lateral leg. This is likely chronic venous in etiology although he has had previous surgery in this area by Dr. Lajoyce Corners several years ago for staph infection. We had approval for TheraSkin although he for some reason he is having too much drainage to consider that. We have been using silver alginate. The wound is measuring smaller today with some rims of epithelialization but still moderate drainage 12/5; right lateral leg. This is a area that has had previous surgery for an infection. It is likely chronic venous. We have had approval for TheraSkin although there is still quite a bit of drainage here. We are using gent Mupin silver alginate. Measurements are slightly smaller 12/10; right lateral lower leg. He continues to make continued progress albeit slowly. The surface of his wound looks healthy. The drainage that we were concerned about in the last 2 weeks has abated. It would be possible now to use the TheraSkin but we have to run prior approval again. Apparently  his insurance fiscal year started on 06/16/2023. Currently we are using gent, mupirocin and silver alginate. As noted continued improvement 12/17; right lateral lower leg in the setting of previous extensive surgery in this area. Also may have some degree of chronic venous disease. We had approval for TheraSkin however he was having too much drainage at that time and therefore we did not use this. Currently we have been using gent mupirocin and silver alginate. The wound is made some improvements although painstakingly slowly 07/18/2023; patient's wound looks a lot better. We have been using silver alginate Zituvimet under and Urgo K2 lite. We had TheraSkin approved about 4 5 weeks ago however for a period of time he developed excessive drainage. We have been using silver alginate with underlying mupirocin. T oday healthy looking granulation rims of epithelialization wound is measuring smaller 07/25/2023; patient presents for follow-up. We have been using silver alginate with antibiotic ointment under compression therapy. He has no issues or complaints today. He denies signs of infection. He has been approved for TheraSkin and he would like to proceed with this at next clinic visit. 08/01/2023; patient presents for follow-up. He has been approved for TheraSkin and he is agreeable to have this placed in office today. We have been using silver alginate with antibiotic ointment under compression wrap to the right leg over the past week. Patient denies signs of infection. Objective Constitutional respirations regular, non-labored and within target range for patient.. Vitals Time Taken: 9:58 AM, Height: 70 in, Weight: 264 lbs, BMI: 37.9, Temperature: 98.8 F, Pulse: 57 bpm, Respiratory Rate: 18 breaths/min, Blood Pressure: 150/86 mmHg. Cardiovascular 2+ dorsalis pedis/posterior tibialis pulses. Psychiatric pleasant and cooperative. General Notes: T the right lateral lower leg there is an open wound with  granulation tissue and slough. No evidence of surrounding infection including increased o warmth, erythema or purulent drainage. Good Edema control. Integumentary (Hair, Skin) Wound #1 status is Open. Original cause of wound was Other Lesion. The date acquired was: 01/01/2018. The wound has been in treatment 26 weeks. The wound is located on the Right,Lateral Lower Leg. The wound measures  5cm length x 2cm width x 0.1cm depth; 7.854cm^2 area and 0.785cm^3 volume. There is Fat Layer (Subcutaneous Tissue) exposed. There is no tunneling or undermining noted. There is a large amount of serosanguineous drainage noted. The wound margin is distinct with the outline attached to the wound base. There is large (67-100%) red, friable granulation within the wound bed. There is a small (1-33%) amount of necrotic tissue within the wound bed including Adherent Slough. The periwound skin appearance had no abnormalities noted for moisture. The KIAEEM, GOOLD ([id number]) [id number] Page 8 of 9 periwound skin appearance exhibited: Scarring, Hemosiderin Staining. The periwound skin appearance did not exhibit: Callus, Crepitus, Excoriation, Induration, Rash, Atrophie Blanche, Cyanosis, Ecchymosis, Mottled, Pallor, Rubor, Erythema. Periwound temperature was noted as No Abnormality. 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 I debrided nonviable tissue. Wound bed had healthy granulation tissue appropriate for skin substitute. TheraSkin was placed in standard fashion. We continued the compression wrap. Follow-up in 1 week. Procedures Wound #1 Pre-procedure diagnosis of Wound #1 is a Diabetic Wound/Ulcer of the Lower Extremity located on the Right,Lateral Lower  Leg .Severity of Tissue Pre Debridement is: Fat layer exposed. There was a Selective/Open Wound Skin/Epidermis Debridement with a total area of 7.85 sq cm performed by Geralyn Corwin, DO. With the following instrument(s): Curette to remove Non-Viable tissue/material. Material removed includes Callus, Slough, Skin: Dermis, and Skin: Epidermis after achieving pain control using Lidocaine 5% topical ointment. No specimens were taken. A time out was conducted at 10:25, prior to the start of the procedure. A Minimum amount of bleeding was controlled with Pressure. The procedure was tolerated well. Post Debridement Measurements: 5cm length x 2cm width x 0.1cm depth; 0.785cm^3 volume. Character of Wound/Ulcer Post Debridement is improved. Severity of Tissue Post Debridement is: Fat layer exposed. Post procedure Diagnosis Wound #1: Same as Pre-Procedure Pre-procedure diagnosis of Wound #1 is a Diabetic Wound/Ulcer of the Lower Extremity located on the Right,Lateral Lower Leg. A skin graft procedure using a bioengineered skin substitute/cellular or tissue based product was performed by Geralyn Corwin, DO with the following instrument(s): Forceps. Theraskin was applied and secured with Steri-Strips. 6.5 sq cm of product was utilized and 0 sq cm was wasted. Post Application, adaptic, gauze, was applied. A Time Out was conducted at 10:25, prior to the start of the procedure. The procedure was tolerated well with a pain level of 0 throughout and a pain level of 0 following the procedure. Post procedure Diagnosis Wound #1: Same as Pre-Procedure . Pre-procedure diagnosis of Wound #1 is a Diabetic Wound/Ulcer of the Lower Extremity located on the Right,Lateral Lower Leg . There was a Three Layer Compression Therapy Procedure by Redmond Pulling, RN. Post procedure Diagnosis Wound #1: Same as Pre-Procedure Plan Follow-up Appointments: Return Appointment in 1 week. - Dr. Mikey Bussing 08/08/23 @ 0845 (already  scheduled) Return Appointment in 2 weeks. - Dr. Mikey Bussing (front office to schedule) Return appointment in 3 weeks. - Dr. Mikey Bussing (front office to schedule) Anesthetic: (In clinic) Topical Lidocaine 5% applied to wound bed Cellular or Tissue Based Products: Wound #1 Right,Lateral Lower Leg: Cellular or Tissue Based Product Type: - Theraskin #1 applied 08/01/2023 Apligraf #1 applied 02/28/23 Apligraf #2 applied 03/05/2023 Apligraf #3 applied 03/12/2023 Apligraf #4 applied 03/19/2023 Apligraf #5 applied on 03/26/2023 Other Cellular or Tissue Based Products  Orders/Instructions: - RUN IVR FOR THERASKIN-05/14/23- approved 100%- have to rerun on 06/16/2023 due to insurance renews. 06/25/23 NOT doing Theraskin 07/02/23 re-run Theraskin Re run IVR for Theraskin 07/18/2023 THeraskin approved - will start 08/01/23 Bathing/ Shower/ Hygiene: May shower with protection but do not get wound dressing(s) wet. Protect dressing(s) with water repellant cover (for example, large plastic bag) or a cast cover and may then take shower. - Please do not get the legs wet. Please use cast protectors when showering. Cast protectors can be purchased from [employer], Medical supply store. Cost ranges from $17-$37 Off-Loading: Other: - Keep legs elevated to heart level or above when sitting. You may place a pillow behind the calf for comfort, if so desired. WOUND #1: - Lower Leg Wound Laterality: Right, Lateral Cleanser: Soap and Water 1 x Per Week/30 Days Discharge Instructions: May shower and wash wound with dial antibacterial soap and water prior to dressing change. Cleanser: Vashe 5.8 (oz) 1 x Per Week/30 Days Discharge Instructions: Cleanse the wound with Vashe prior to applying a clean dressing using gauze sponges, not tissue or cotton balls. Peri-Wound Care: Triamcinolone 15 (g) 1 x Per Week/30 Days Discharge Instructions: Use triamcinolone 15 (g) as directed Peri-Wound Care: Sween Lotion (Moisturizing lotion)  1 x Per Week/30 Days Discharge Instructions: Apply moisturizing lotion as directed Prim Dressing: Theraskin 1 x Per Week/30 Days ary Secondary Dressing: ADAPTIC TOUCH 3x4.25 in 1 x Per Week/30 Days Discharge Instructions: Apply over primary dressing as directed. Secondary Dressing: CarboFLEX Odor Control Dressing, 4x4 in 1 x Per Week/30 Days Discharge Instructions: Apply over primary dressing as directed. ALLWIN, KRUMENACKER ([id number]) [id number] Page 9 of 9 Secondary Dressing: Woven Gauze Sponge, Non-Sterile 4x4 in 1 x Per Week/30 Days Discharge Instructions: Apply over primary dressing as directed. Secondary Dressing: Zetuvit Plus 4x8 in 1 x Per Week/30 Days Discharge Instructions: Apply over primary dressing as directed. Secured With: Steri-Strip 1/4x3 (in/in) 1 x Per Week/30 Days Discharge Instructions: Secure dressing with steri-strips as directed. Compression Wrap: Urgo K2 Lite, (equivalent to a 3 layer) two layer compression system, regular 1 x Per Week/30 Days Discharge Instructions: Apply Urgo K2 Lite as directed (alternative to 3 layer compression). Compression Wrap: Stockinette 1 x Per Week/30 Days 1. In office sharp debridement 2. TheraSkin placed in standard fashion 3. 3 layer compression to the right lower extremity Electronic Signature(s) Signed: 08/05/2023 12:33:32 PM By: Geralyn Corwin DO Signed: 08/05/2023 2:44:18 PM By: Shawn Stall RN, BSN Previous Signature: 08/01/2023 4:08:41 PM Version By: Geralyn Corwin DO Entered By: Shawn Stall on 08/05/2023 11:23:43 -------------------------------------------------------------------------------- SuperBill Details Patient Name: Date of Service: [name]. 08/01/2023 Medical Record Number: [id number] Patient Account Number: [id number] Date of Birth/Sex: Treating RN: [birth year] (53 y.o. M) Primary Care Provider: Barbette Reichmann Other Clinician: Referring Provider: Treating  Provider/Extender: Adolph Pollack Weeks in Treatment: 26 Diagnosis Coding ICD-10 Codes Code Description [phone] Non-pressure chronic ulcer of other part of right lower leg with fat layer exposed E11.622 Type 2 diabetes mellitus with other skin ulcer I87.313 Chronic venous hypertension (idiopathic) with ulcer of bilateral lower extremity Facility Procedures : CPT4 Code: [id number] Description: Q4121- Theraskin per 1sq cm Large -39 sq cm Modifier: Quantity: 6.5 : CPT4 Code: 84696295 Description: 15271 - SKIN SUB GRAFT TRNK/ARM/LEG ICD-10 Diagnosis Description L97.812 Non-pressure chronic ulcer of other part of right lower leg with fat layer expo E11.622 Type 2 diabetes mellitus with other skin ulcer I87.313 Chronic venous  hypertension (idiopathic) with ulcer of bilateral lower extremit  Modifier: sed y Quantity: 1 Physician Procedures : CPT4 Code Description Modifier [id number] 15271 - WC PHYS SKIN SUB GRAFT TRNK/ARM/LEG ICD-10 Diagnosis Description L97.812 Non-pressure chronic ulcer of other part of right lower leg with fat layer exposed E11.622 Type 2 diabetes mellitus with other skin  ulcer I87.313 Chronic venous hypertension (idiopathic) with ulcer of bilateral lower extremity Quantity: 1 Electronic Signature(s) Signed: 08/01/2023 4:08:41 PM By: Geralyn Corwin DO Entered By: Geralyn Corwin on 08/01/2023 11:10:00

## 2023-08-06 NOTE — Progress Notes (Signed)
[name], Price ([id number]) [id number] Page 1 of 9 Visit Report for 08/01/2023 Arrival Information Details Patient Name: Date of Service: [name] 08/01/2023 9:30 A M Medical Record Number: [id number] Patient Account Number: [id number] Date of Birth/Sex: Treating RN: 1971-06-27 (53 y.o. M) Primary Care Ayleah Hofmeister: Barbette Reichmann Other Clinician: Referring Cashawn Yanko: Treating Keyarah Mcroy/Extender: Adolph Pollack Weeks in Treatment: 26 Visit Information History Since Last Visit Added or deleted any medications: No Patient Arrived: Ambulatory Any new allergies or adverse reactions: No Arrival Time: 09:51 Had a fall or experienced change in No Accompanied By: self activities of daily living that may affect Transfer Assistance: None risk of falls: Patient Identification Verified: Yes Signs or symptoms of abuse/neglect since last visito No Secondary Verification Process Completed: Yes Hospitalized since last visit: No Patient Requires Transmission-Based Precautions: No Implantable device outside of the clinic excluding No Patient Has Alerts: No cellular tissue based products placed in the center since last visit: Has Dressing in Place as Prescribed: Yes Has Compression in Place as Prescribed: Yes Pain Present Now: No Electronic Signature(s) Signed: 08/06/2023 4:04:36 PM By: Thayer Dallas Entered By: Thayer Dallas on 08/01/2023 09:52:07 -------------------------------------------------------------------------------- Complex / Palliative Patient Assessment Details Patient Name: Date of Service: [name] 08/01/2023 9:30 A M Medical Record Number: [id number] Patient Account Number: [id number] Date of Birth/Sex: Treating RN: 07/12/1971 (53 y.o. [name] Primary Care Shawntrice Salle: Barbette Reichmann Other Clinician: Referring Olia Hinderliter: Treating Anuar Walgren/Extender: Adolph Pollack Weeks in  Treatment: 26 Complex Wound Management Criteria Patient has remarkable or complex co-morbidities requiring medications or treatments that extend wound healing times. Examples: Diabetes mellitus with chronic renal failure or end stage renal disease requiring dialysis Advanced or poorly controlled rheumatoid arthritis Diabetes mellitus and end stage chronic obstructive pulmonary disease Active cancer with current chemo- or radiation therapy CVA, HTN, DVT PVD, anemia, stage 2 CKD, necrotizing fasciitis, septic arthritis , Palliative Wound Management Criteria Care Approach Wound Care Plan: Complex Wound Management Electronic Signature(s) Signed: 08/05/2023 2:53:04 PM By: Shawn Stall RN, BSN Signed: 08/05/2023 3:23:55 PM By: Geralyn Corwin DO Entered By: Shawn Stall on 08/05/2023 14:53:04 [name] ([id number]) [id number] Page 2 of 9 -------------------------------------------------------------------------------- Compression Therapy Details Patient Name: Date of Service: [name] 08/01/2023 9:30 A M Medical Record Number: [id number] Patient Account Number: [id number] Date of Birth/Sex: Treating RN: 16-Apr-1971 (53 y.o. [name] Primary Care Wania Longstreth: Barbette Reichmann Other Clinician: Referring Agnes Probert: Treating Keiran Sias/Extender: Adolph Pollack Weeks in Treatment: 26 Compression Therapy Performed for Wound Assessment: Wound #1 Right,Lateral Lower Leg Performed By: Clinician Redmond Pulling, RN Compression Type: Three Layer Post Procedure Diagnosis Same as Pre-procedure Electronic Signature(s) Signed: 08/01/2023 4:39:29 PM By: Redmond Pulling RN, BSN Entered By: Redmond Pulling on 08/01/2023 11:17:09 -------------------------------------------------------------------------------- Encounter Discharge Information Details Patient Name: Date of Service: [name] 08/01/2023 9:30 A M Medical Record  Number: [id number] Patient Account Number: [id number] Date of Birth/Sex: Treating RN: 09-09-1970 (53 y.o. [name] Primary Care Rahil Passey: Barbette Reichmann Other Clinician: Referring Capri Raben: Treating Alexandrina Fiorini/Extender: Adolph Pollack Weeks in Treatment: 24 Encounter Discharge Information Items Post Procedure Vitals Discharge Condition: Stable Temperature (F): 98.8 Ambulatory Status: Ambulatory Pulse (bpm): 57 Discharge Destination: Home Respiratory Rate (breaths/min): 18 Transportation: Private Auto Blood Pressure (mmHg): 150/86 Accompanied By: self Schedule Follow-up Appointment: Yes Clinical Summary of Care: Patient Declined Electronic Signature(s) Signed: 08/01/2023 4:39:29 PM By: Redmond Pulling RN, BSN Entered By: Redmond Pulling on 08/01/2023 10:35:59 --------------------------------------------------------------------------------  Lower Extremity Assessment Details Patient Name: Date of Service: [name] 08/01/2023 9:30 A M Medical Record Number: [id number] Patient Account Number: [id number] Date of Birth/Sex: Treating RN: February 06, 1971 (53 y.o. M) Primary Care Aleaya Latona: Barbette Reichmann Other Clinician: Referring Jaxzen Vanhorn: Treating Gilliam Hawkes/Extender: Adolph Pollack Weeks in Treatment: [address] [place] ([id number]) [id number] Page 3 of 9 Edema Assessment Assessed: [Left: No] [Right: No] Edema: [Left: N] [Right: o] Calf Left: Right: Point of Measurement: 34 cm From Medial Instep 32.2 cm Ankle Left: Right: Point of Measurement: 10 cm From Medial Instep 21.6 cm Vascular Assessment Extremity colors, hair growth, and conditions: Hair Growth on Extremity: [Right:No] Temperature of Extremity: [Right:Warm] Capillary Refill: [Right:< 3 seconds] Dependent Rubor: [Right:No No] Toe Nail Assessment Left: Right: Thick: Yes Discolored: Yes Deformed: No Improper Length and Hygiene:  Yes Electronic Signature(s) Signed: 08/06/2023 4:04:36 PM By: Thayer Dallas Entered By: Thayer Dallas on 08/01/2023 09:58:58 -------------------------------------------------------------------------------- Multi Wound Chart Details Patient Name: Date of Service: [name] 08/01/2023 9:30 A M Medical Record Number: [id number] Patient Account Number: [id number] Date of Birth/Sex: Treating RN: 05-26-1971 (53 y.o. M) Primary Care Kennon Encinas: Barbette Reichmann Other Clinician: Referring Waldemar Siegel: Treating Sophronia Varney/Extender: Marcha Dutton, Vishwanath Weeks in Treatment: 26 Vital Signs Height(in): 70 Pulse(bpm): 57 Weight(lbs): 264 Blood Pressure(mmHg): 150/86 Body Mass Index(BMI): 37.9 Temperature(F): 98.8 Respiratory Rate(breaths/min): 18 [1:Photos:] [N/A:N/A] Right, Lateral Lower Leg N/A N/A Wound Location: Other Lesion N/A N/A Wounding Event: Diabetic Wound/Ulcer of the Lower N/A N/A Primary Etiology: Extremity CHOYCE, GROOT ([id number]) [phone].pdf Page 4 of 9 Anemia, Deep Vein Thrombosis, N/A N/A Comorbid History: Hypertension, Peripheral Venous Disease, Type II Diabetes, Gout 01/01/2018 N/A N/A Date Acquired: 43 N/A N/A Weeks of Treatment: Open N/A N/A Wound Status: No N/A N/A Wound Recurrence: 5x2x0.1 N/A N/A Measurements L x W x D (cm) 7.854 N/A N/A A (cm) : rea 0.785 N/A N/A Volume (cm) : 39.90% N/A N/A % Reduction in A rea: 85.00% N/A N/A % Reduction in Volume: Grade 2 N/A N/A Classification: Large N/A N/A Exudate A mount: Serosanguineous N/A N/A Exudate Type: red, brown N/A N/A Exudate Color: Distinct, outline attached N/A N/A Wound Margin: Large (67-100%) N/A N/A Granulation A mount: Red, Friable N/A N/A Granulation Quality: Small (1-33%) N/A N/A Necrotic A mount: Fat Layer (Subcutaneous Tissue): Yes N/A N/A Exposed Structures: Fascia: No Tendon: No Muscle: No Joint: No Bone: No Small  (1-33%) N/A N/A Epithelialization: Debridement - Selective/Open Wound N/A N/A Debridement: Pre-procedure Verification/Time Out 10:25 N/A N/A Taken: Lidocaine 5% topical ointment N/A N/A Pain Control: Callus, Slough N/A N/A Tissue Debrided: Skin/Epidermis N/A N/A Level: 7.85 N/A N/A Debridement A (sq cm): rea Curette N/A N/A Instrument: Minimum N/A N/A Bleeding: Pressure N/A N/A Hemostasis A chieved: Procedure was tolerated well N/A N/A Debridement Treatment Response: 5x2x0.1 N/A N/A Post Debridement Measurements L x W x D (cm) 0.785 N/A N/A Post Debridement Volume: (cm) Scarring: Yes N/A N/A Periwound Skin Texture: Excoriation: No Induration: No Callus: No Crepitus: No Rash: No Maceration: No N/A N/A Periwound Skin Moisture: Dry/Scaly: No Hemosiderin Staining: Yes N/A N/A Periwound Skin Color: Atrophie Blanche: No Cyanosis: No Ecchymosis: No Erythema: No Mottled: No Pallor: No Rubor: No No Abnormality N/A N/A Temperature: Cellular or Tissue Based Product N/A N/A Procedures Performed: Debridement Treatment Notes Wound #1 (Lower Leg) Wound Laterality: Right, Lateral Cleanser Soap and Water Discharge Instruction: May shower and wash wound with dial antibacterial soap and water prior to dressing change. Vashe 5.8 (oz) Discharge  Instruction: Cleanse the wound with Vashe prior to applying a clean dressing using gauze sponges, not tissue or cotton balls. Peri-Wound Care Triamcinolone 15 (g) Discharge Instruction: Use triamcinolone 15 (g) as directed Sween Lotion (Moisturizing lotion) Discharge Instruction: Apply moisturizing lotion as directed Topical Primary Dressing [address] HERON, CORFIELD ([id number]) [id number] Page 5 of 9 ADAPTIC TOUCH 3x4.25 in Discharge Instruction: Apply over primary dressing as directed. CarboFLEX Odor Control Dressing, 4x4 in Discharge Instruction: Apply over primary dressing  as directed. Woven Gauze Sponge, Non-Sterile 4x4 in Discharge Instruction: Apply over primary dressing as directed. Zetuvit Plus 4x8 in Discharge Instruction: Apply over primary dressing as directed. Secured With Steri-Strip 1/4x3 (in/in) Discharge Instruction: Secure dressing with steri-strips as directed. Compression Wrap Urgo K2 Lite, (equivalent to a 3 layer) two layer compression system, regular Discharge Instruction: Apply Urgo K2 Lite as directed (alternative to 3 layer compression). Stockinette Compression Stockings Facilities manager) Signed: 08/01/2023 4:08:41 PM By: Geralyn Corwin DO Entered By: Geralyn Corwin on 08/01/2023 10:52:47 -------------------------------------------------------------------------------- Multi-Disciplinary Care Plan Details Patient Name: Date of Service: [name] 08/01/2023 9:30 A M Medical Record Number: [id number] Patient Account Number: [id number] Date of Birth/Sex: Treating RN: 1970-08-06 (53 y.o. [name] Primary Care Lyana Asbill: Barbette Reichmann Other Clinician: Referring Yuriy Cui: Treating Carletha Dawn/Extender: Adolph Pollack Weeks in Treatment: 38 Active Inactive Wound/Skin Impairment Nursing Diagnoses: Impaired tissue integrity Goals: Patient/caregiver will verbalize understanding of skin care regimen Date Initiated: 01/25/2023 Target Resolution Date: 09/13/2023 Goal Status: Active Interventions: Assess ulceration(s) every visit Treatment Activities: Skin care regimen initiated : 01/25/2023 Notes: Electronic Signature(s) Signed: 08/01/2023 4:39:29 PM By: Redmond Pulling RN, BSN Entered By: Redmond Pulling on 08/01/2023 10:34:47 [name] ([id number]) [id number] Page 6 of 9 -------------------------------------------------------------------------------- Pain Assessment Details Patient Name: Date of Service: [name] 08/01/2023 9:30  A M Medical Record Number: [id number] Patient Account Number: [id number] Date of Birth/Sex: Treating RN: [birth year] (53 y.o. M) Primary Care Eleora Sutherland: Barbette Reichmann Other Clinician: Referring Arlyn Buerkle: Treating Shakeerah Gradel/Extender: Adolph Pollack Weeks in Treatment: 26 Active Problems Location of Pain Severity and Description of Pain Patient Has Paino No Site Locations Pain Management and Medication Current Pain Management: Electronic Signature(s) Signed: 08/06/2023 4:04:36 PM By: Thayer Dallas Entered By: Thayer Dallas on 08/01/2023 09:58:34 -------------------------------------------------------------------------------- Patient/Caregiver Education Details Patient Name: Date of Service: [name] 1/16/2025andnbsp9:30 A M Medical Record Number: [id number] Patient Account Number: [id number] Date of Birth/Gender: Treating RN: 07-Mar-1971 (53 y.o. [name] Primary Care Physician: Barbette Reichmann Other Clinician: Referring Physician: Treating Physician/Extender: Adolph Pollack Weeks in Treatment: 60 Education Assessment Education Provided To: Patient Education Topics Provided Wound/Skin Impairment: Methods: Explain/Verbal Responses: State content correctly SAMUELLE, FULFORD ([id number]) [id number] Page 7 of 9 Electronic Signature(s) Signed: 08/01/2023 4:39:29 PM By: Redmond Pulling RN, BSN Entered By: Redmond Pulling on 08/01/2023 10:35:05 -------------------------------------------------------------------------------- Wound Assessment Details Patient Name: Date of Service: [name] 08/01/2023 9:30 A M Medical Record Number: [id number] Patient Account Number: [id number] Date of Birth/Sex: Treating RN: 16-Jun-1971 (53 y.o. M) Primary Care Kohner Orlick: Barbette Reichmann Other Clinician: Referring Jamila Slatten: Treating Munachimso Rigdon/Extender: Marcha Dutton, Vishwanath Weeks in  Treatment: 26 Wound Status Wound Number: 1 Primary Diabetic Wound/Ulcer of the Lower Extremity Etiology: Wound Location: Right, Lateral Lower Leg Wound Open Wounding Event: Other Lesion Status: Date Acquired: 01/01/2018 Notes: Pt. 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 Weeks Of Treatment: 26 Comorbid Anemia, Deep Vein Thrombosis, Hypertension, Peripheral Venous  Clustered Wound: No History: Disease, Type II Diabetes, Gout Photos Wound Measurements Length: (cm) 5 Width: (cm) 2 Depth: (cm) 0.1 Area: (cm) 7.854 Volume: (cm) 0.785 % Reduction in Area: 39.9% % Reduction in Volume: 85% Epithelialization: Small (1-33%) Tunneling: No Undermining: No Wound Description Classification: Grade 2 Wound Margin: Distinct, outline attached Exudate Amount: Large Exudate Type: Serosanguineous Exudate Color: red, brown Foul Odor After Cleansing: No Slough/Fibrino Yes Wound Bed Granulation Amount: Large (67-100%) Exposed Structure Granulation Quality: Red, Friable Fascia Exposed: No Necrotic Amount: Small (1-33%) Fat Layer (Subcutaneous Tissue) Exposed: Yes Necrotic Quality: Adherent Slough Tendon Exposed: No Muscle Exposed: No Joint Exposed: No Bone Exposed: No Periwound Skin Texture Texture Color No Abnormalities Noted: No No Abnormalities Noted: No Callus: No Atrophie Blanche: No Crepitus: No Cyanosis: No JESSUP, ARCILA K ([id number]) [phone].pdf Page 8 of 9 Excoriation: No Ecchymosis: No Induration: No Erythema: No Rash: No Hemosiderin Staining: Yes Scarring: Yes Mottled: No Pallor: No Moisture Rubor: No No Abnormalities Noted: Yes Temperature / Pain Temperature: No Abnormality Treatment Notes Wound #1 (Lower Leg) Wound Laterality: Right, Lateral Cleanser Soap and Water Discharge Instruction: May shower and wash wound with dial antibacterial soap and water prior to dressing change. Vashe 5.8 (oz) Discharge Instruction:  Cleanse the wound with Vashe prior to applying a clean dressing using gauze sponges, not tissue or cotton balls. Peri-Wound Care Triamcinolone 15 (g) Discharge Instruction: Use triamcinolone 15 (g) as directed Sween Lotion (Moisturizing lotion) Discharge Instruction: Apply moisturizing lotion as directed Topical Primary Dressing [place] Secondary Dressing ADAPTIC TOUCH 3x4.25 in Discharge Instruction: Apply over primary dressing as directed. CarboFLEX Odor Control Dressing, 4x4 in Discharge Instruction: Apply over primary dressing as directed. Woven Gauze Sponge, Non-Sterile 4x4 in Discharge Instruction: Apply over primary dressing as directed. Zetuvit Plus 4x8 in Discharge Instruction: Apply over primary dressing as directed. Secured With Steri-Strip 1/4x3 (in/in) Discharge Instruction: Secure dressing with steri-strips as directed. Compression Wrap Urgo K2 Lite, (equivalent to a 3 layer) two layer compression system, regular Discharge Instruction: Apply Urgo K2 Lite as directed (alternative to 3 layer compression). Stockinette Compression Stockings Facilities manager) Signed: 08/06/2023 4:04:36 PM By: Thayer Dallas Entered By: Thayer Dallas on 08/01/2023 10:01:33 -------------------------------------------------------------------------------- Vitals Details Patient Name: Date of Service: [name] 08/01/2023 9:30 A M Medical Record Number: [id number] Patient Account Number: [id number] Date of Birth/Sex: Treating RN: 09/12/1970 (53 y.o. M) Primary Care Anjani Feuerborn: Barbette Reichmann Other Clinician: Referring Mignonne Afonso: Treating Kionte Baumgardner/Extender: Adolph Pollack Weeks in Treatment: [address], [place] K ([id number]) [id number] Page 9 of 9 Vital Signs Time Taken: 09:58 Temperature (F): 98.8 Height (in): 70 Pulse (bpm): 57 Weight (lbs): 264 Respiratory Rate (breaths/min): 18 Body Mass Index (BMI):  37.9 Blood Pressure (mmHg): 150/86 Reference Range: 80 - 120 mg / dl Electronic Signature(s) Signed: 08/06/2023 4:04:36 PM By: Thayer Dallas Entered By: Thayer Dallas on 08/01/2023 09:58:28

## 2023-08-08 ENCOUNTER — Encounter (HOSPITAL_BASED_OUTPATIENT_CLINIC_OR_DEPARTMENT_OTHER): Payer: Medicaid Other | Admitting: Internal Medicine

## 2023-08-08 DIAGNOSIS — L97812 Non-pressure chronic ulcer of other part of right lower leg with fat layer exposed: Secondary | ICD-10-CM | POA: Diagnosis not present

## 2023-08-08 DIAGNOSIS — I87311 Chronic venous hypertension (idiopathic) with ulcer of right lower extremity: Secondary | ICD-10-CM

## 2023-08-08 DIAGNOSIS — E11622 Type 2 diabetes mellitus with other skin ulcer: Secondary | ICD-10-CM

## 2023-08-08 DIAGNOSIS — I87313 Chronic venous hypertension (idiopathic) with ulcer of bilateral lower extremity: Secondary | ICD-10-CM | POA: Diagnosis not present

## 2023-08-15 ENCOUNTER — Encounter (HOSPITAL_BASED_OUTPATIENT_CLINIC_OR_DEPARTMENT_OTHER): Payer: Medicaid Other | Admitting: Internal Medicine

## 2023-08-15 DIAGNOSIS — I87313 Chronic venous hypertension (idiopathic) with ulcer of bilateral lower extremity: Secondary | ICD-10-CM | POA: Diagnosis not present

## 2023-08-15 DIAGNOSIS — L97812 Non-pressure chronic ulcer of other part of right lower leg with fat layer exposed: Secondary | ICD-10-CM

## 2023-08-15 DIAGNOSIS — E11622 Type 2 diabetes mellitus with other skin ulcer: Secondary | ICD-10-CM | POA: Diagnosis not present

## 2023-08-22 ENCOUNTER — Encounter (HOSPITAL_BASED_OUTPATIENT_CLINIC_OR_DEPARTMENT_OTHER): Payer: Medicaid Other | Attending: Internal Medicine | Admitting: Internal Medicine

## 2023-08-22 DIAGNOSIS — I87311 Chronic venous hypertension (idiopathic) with ulcer of right lower extremity: Secondary | ICD-10-CM | POA: Insufficient documentation

## 2023-08-22 DIAGNOSIS — L97812 Non-pressure chronic ulcer of other part of right lower leg with fat layer exposed: Secondary | ICD-10-CM | POA: Diagnosis not present

## 2023-08-22 DIAGNOSIS — E11622 Type 2 diabetes mellitus with other skin ulcer: Secondary | ICD-10-CM | POA: Diagnosis not present

## 2023-08-22 DIAGNOSIS — Z86718 Personal history of other venous thrombosis and embolism: Secondary | ICD-10-CM | POA: Diagnosis not present

## 2023-08-22 DIAGNOSIS — Z794 Long term (current) use of insulin: Secondary | ICD-10-CM | POA: Diagnosis not present

## 2023-08-29 ENCOUNTER — Encounter (HOSPITAL_BASED_OUTPATIENT_CLINIC_OR_DEPARTMENT_OTHER): Payer: Medicaid Other | Admitting: Internal Medicine

## 2023-08-29 DIAGNOSIS — I87311 Chronic venous hypertension (idiopathic) with ulcer of right lower extremity: Secondary | ICD-10-CM | POA: Diagnosis not present

## 2023-08-29 DIAGNOSIS — E11622 Type 2 diabetes mellitus with other skin ulcer: Secondary | ICD-10-CM

## 2023-08-29 DIAGNOSIS — Z86718 Personal history of other venous thrombosis and embolism: Secondary | ICD-10-CM | POA: Diagnosis not present

## 2023-08-29 DIAGNOSIS — L97812 Non-pressure chronic ulcer of other part of right lower leg with fat layer exposed: Secondary | ICD-10-CM

## 2023-08-29 DIAGNOSIS — Z794 Long term (current) use of insulin: Secondary | ICD-10-CM | POA: Diagnosis not present

## 2023-09-05 ENCOUNTER — Encounter (HOSPITAL_BASED_OUTPATIENT_CLINIC_OR_DEPARTMENT_OTHER): Payer: Medicaid Other | Admitting: Internal Medicine

## 2023-09-05 DIAGNOSIS — Z794 Long term (current) use of insulin: Secondary | ICD-10-CM | POA: Diagnosis not present

## 2023-09-05 DIAGNOSIS — L97812 Non-pressure chronic ulcer of other part of right lower leg with fat layer exposed: Secondary | ICD-10-CM | POA: Diagnosis not present

## 2023-09-05 DIAGNOSIS — E11622 Type 2 diabetes mellitus with other skin ulcer: Secondary | ICD-10-CM | POA: Diagnosis not present

## 2023-09-05 DIAGNOSIS — I87311 Chronic venous hypertension (idiopathic) with ulcer of right lower extremity: Secondary | ICD-10-CM | POA: Diagnosis not present

## 2023-09-05 DIAGNOSIS — Z86718 Personal history of other venous thrombosis and embolism: Secondary | ICD-10-CM | POA: Diagnosis not present

## 2023-09-12 ENCOUNTER — Encounter (HOSPITAL_BASED_OUTPATIENT_CLINIC_OR_DEPARTMENT_OTHER): Payer: Medicaid Other | Admitting: Internal Medicine

## 2023-09-12 DIAGNOSIS — Z794 Long term (current) use of insulin: Secondary | ICD-10-CM | POA: Diagnosis not present

## 2023-09-12 DIAGNOSIS — L97812 Non-pressure chronic ulcer of other part of right lower leg with fat layer exposed: Secondary | ICD-10-CM | POA: Diagnosis not present

## 2023-09-12 DIAGNOSIS — Z86718 Personal history of other venous thrombosis and embolism: Secondary | ICD-10-CM | POA: Diagnosis not present

## 2023-09-12 DIAGNOSIS — E11622 Type 2 diabetes mellitus with other skin ulcer: Secondary | ICD-10-CM | POA: Diagnosis not present

## 2023-09-12 DIAGNOSIS — I87311 Chronic venous hypertension (idiopathic) with ulcer of right lower extremity: Secondary | ICD-10-CM

## 2023-09-19 ENCOUNTER — Encounter (HOSPITAL_BASED_OUTPATIENT_CLINIC_OR_DEPARTMENT_OTHER): Payer: Medicaid Other | Attending: Internal Medicine | Admitting: Internal Medicine

## 2023-09-19 DIAGNOSIS — Z794 Long term (current) use of insulin: Secondary | ICD-10-CM | POA: Diagnosis not present

## 2023-09-19 DIAGNOSIS — E11622 Type 2 diabetes mellitus with other skin ulcer: Secondary | ICD-10-CM | POA: Diagnosis not present

## 2023-09-19 DIAGNOSIS — L97812 Non-pressure chronic ulcer of other part of right lower leg with fat layer exposed: Secondary | ICD-10-CM | POA: Diagnosis not present

## 2023-09-19 DIAGNOSIS — I87311 Chronic venous hypertension (idiopathic) with ulcer of right lower extremity: Secondary | ICD-10-CM | POA: Insufficient documentation

## 2023-09-26 ENCOUNTER — Encounter (HOSPITAL_BASED_OUTPATIENT_CLINIC_OR_DEPARTMENT_OTHER): Payer: Medicaid Other | Admitting: Internal Medicine

## 2023-09-26 DIAGNOSIS — E11622 Type 2 diabetes mellitus with other skin ulcer: Secondary | ICD-10-CM

## 2023-09-26 DIAGNOSIS — L97812 Non-pressure chronic ulcer of other part of right lower leg with fat layer exposed: Secondary | ICD-10-CM | POA: Diagnosis not present

## 2023-09-26 DIAGNOSIS — Z794 Long term (current) use of insulin: Secondary | ICD-10-CM | POA: Diagnosis not present

## 2023-09-26 DIAGNOSIS — I87311 Chronic venous hypertension (idiopathic) with ulcer of right lower extremity: Secondary | ICD-10-CM

## 2023-10-03 ENCOUNTER — Encounter (HOSPITAL_BASED_OUTPATIENT_CLINIC_OR_DEPARTMENT_OTHER): Payer: Medicaid Other | Admitting: Internal Medicine

## 2023-10-03 DIAGNOSIS — E11622 Type 2 diabetes mellitus with other skin ulcer: Secondary | ICD-10-CM

## 2023-10-03 DIAGNOSIS — I87311 Chronic venous hypertension (idiopathic) with ulcer of right lower extremity: Secondary | ICD-10-CM | POA: Diagnosis not present

## 2023-10-03 DIAGNOSIS — Z794 Long term (current) use of insulin: Secondary | ICD-10-CM | POA: Diagnosis not present

## 2023-10-03 DIAGNOSIS — L97812 Non-pressure chronic ulcer of other part of right lower leg with fat layer exposed: Secondary | ICD-10-CM

## 2023-10-10 ENCOUNTER — Encounter (HOSPITAL_BASED_OUTPATIENT_CLINIC_OR_DEPARTMENT_OTHER): Admitting: Internal Medicine

## 2023-10-17 ENCOUNTER — Ambulatory Visit (HOSPITAL_BASED_OUTPATIENT_CLINIC_OR_DEPARTMENT_OTHER): Admitting: Internal Medicine

## 2023-10-24 ENCOUNTER — Ambulatory Visit (HOSPITAL_BASED_OUTPATIENT_CLINIC_OR_DEPARTMENT_OTHER): Admitting: Internal Medicine
